# Patient Record
Sex: Male | Born: 1965
Health system: Southern US, Community
[De-identification: ages and names within clinical notes are randomized; demographics above are authoritative.]

## PROBLEM LIST (undated history)

## (undated) DIAGNOSIS — I639 Cerebral infarction, unspecified: Secondary | ICD-10-CM

## (undated) DIAGNOSIS — E119 Type 2 diabetes mellitus without complications: Secondary | ICD-10-CM

## (undated) DIAGNOSIS — N529 Male erectile dysfunction, unspecified: Secondary | ICD-10-CM

## (undated) DIAGNOSIS — B019 Varicella without complication: Secondary | ICD-10-CM

## (undated) DIAGNOSIS — K921 Melena: Secondary | ICD-10-CM

## (undated) DIAGNOSIS — M109 Gout, unspecified: Secondary | ICD-10-CM

## (undated) DIAGNOSIS — I10 Essential (primary) hypertension: Secondary | ICD-10-CM

## (undated) HISTORY — DX: Type 2 diabetes mellitus without complications: E11.9

## (undated) HISTORY — DX: Melena: K92.1

## (undated) HISTORY — DX: Varicella without complication: B01.9

## (undated) HISTORY — DX: Gout, unspecified: M10.9

## (undated) HISTORY — DX: Male erectile dysfunction, unspecified: N52.9

## (undated) HISTORY — DX: Cerebral infarction, unspecified: I63.9

## (undated) HISTORY — PX: COLONOSCOPY: SHX174

---

## 1998-05-25 ENCOUNTER — Emergency Department (HOSPITAL_COMMUNITY): Admission: EM | Admit: 1998-05-25 | Discharge: 1998-05-25 | Payer: Self-pay | Admitting: Emergency Medicine

## 1998-05-25 ENCOUNTER — Encounter: Payer: Self-pay | Admitting: Emergency Medicine

## 2002-06-24 HISTORY — PX: APPENDECTOMY: SHX54

## 2003-01-21 ENCOUNTER — Encounter: Payer: Self-pay | Admitting: *Deleted

## 2003-01-21 ENCOUNTER — Inpatient Hospital Stay (HOSPITAL_COMMUNITY): Admission: EM | Admit: 2003-01-21 | Discharge: 2003-01-22 | Payer: Self-pay | Admitting: *Deleted

## 2003-01-21 ENCOUNTER — Encounter (INDEPENDENT_AMBULATORY_CARE_PROVIDER_SITE_OTHER): Payer: Self-pay | Admitting: Specialist

## 2004-02-08 ENCOUNTER — Ambulatory Visit (HOSPITAL_COMMUNITY): Admission: RE | Admit: 2004-02-08 | Discharge: 2004-02-08 | Payer: Self-pay | Admitting: Family Medicine

## 2004-02-08 ENCOUNTER — Encounter: Payer: Self-pay | Admitting: Cardiology

## 2007-12-16 ENCOUNTER — Encounter: Admission: RE | Admit: 2007-12-16 | Discharge: 2007-12-16 | Payer: Self-pay | Admitting: Family Medicine

## 2008-01-31 ENCOUNTER — Emergency Department (HOSPITAL_COMMUNITY): Admission: EM | Admit: 2008-01-31 | Discharge: 2008-01-31 | Payer: Self-pay | Admitting: Emergency Medicine

## 2010-11-09 NOTE — Op Note (Signed)
[name], [name] NO.:  [id number]   MEDICAL RECORD NO.:  [id number]                   PATIENT TYPE:  INP   LOCATION:  0103                                 FACILITY:  [facility]   PHYSICIAN:  Anselm Pancoast. Zachery Dakins, M.D.          DATE OF BIRTH:  October 07, 1965   DATE OF PROCEDURE:  01/21/2003  DATE OF DISCHARGE:                                 OPERATIVE REPORT   PREOPERATIVE DIAGNOSIS:  Retrocecal appendicitis.   POSTOPERATIVE DIAGNOSIS:  Retrocecal appendicitis.   OPERATION:  Laparoscopic appendectomy.   ANESTHESIA:  General.   SURGEON:  Anselm Pancoast. Zachery Dakins, M.D.   HISTORY:  Keith Leblanc is a 45 year old male, about 266 pounds, and quite  tall, who presented to the emergency room with right-sided abdominal pain of  approximately 24 hours duration.  He was seen by Sheppard Penton. Stacie Acres, M.D.  Originally an ultrasound of the gallbladder was obtained since the pain was  a little higher than right lower quadrant.  There were no stones seen in the  gallbladder.  His white count was about 12,000, and then a CT was obtained.  This showed an appendix that was inflamed that was called retrocecal, but it  was really laying over in the gutter, kind of behind the cecum, not truly a  retrocecal, and he was definitely tender where the appendix was located, and  I was in agreement with the diagnosis.  I recommended that we attempt to do  a laparoscopic appendectomy because of his size.  He has quite a prominent  weakness at the umbilicus but no really that he is aware of as a symptomatic  umbilical hernia.   DESCRIPTION OF PROCEDURE:  The patient was taken to the operative suite and  given 3 g of Unasyn and positioned on the OR table and induction of general  anesthesia.  His EKG showed changes consistent with intermittent possible  hypertension and when he was first seen, he was hypertensive and then after  pain medication, he was normotensive.  A small incision  was made just above  the umbilicus where the little fascial defect was the most prominent and  just basically was opened through the skin, and we were actually in a little  weakness, kind of a bone-shaped hernia sac.  Pursestring suture of 0 Vicryl  was placed, and then the carbon dioxide was started after the Hasson cannula  placed.  We could not see the exudate in the right lower quadrant, but I  could see kind of a mobile cecum, and the upper 5 mm trocar was placed in  his right subcostal area, and then the 10-11 trocar in the left lower  quadrant.  With this and the camera basically in the lower port site, I  could kind of manipulate the cecum and found the base of the cecum, could  identify the appendix, and the appendix was laying right at the lateral  peritoneal reflection  but not truly retrocecal.  I took the harmonic scalpel  and kind of divided the peritoneum of this so we could rotate it into the  field.  Then, the harmonic scalpel was used to kind of go across the  appendiceal mesentery in numerous little bites and then after the base was  freed, I transected the base of the appendix where it joined with the cecum  with the GIA disposable linear stapler.  The remaining little portion of the  mesentery was then divided and sealed with the harmonic scalpel.  The  __________ of appendix was placed in an EndoCatch bag and brought out  through the fascial defect.  We had irrigated, and I reinspected, and no  evidence of any bleeding.  The lower port was first removed under direct  vision.  The irrigating fluid was removed, and then the remaining 5 mm port  was withdrawn.  With this, I then freed up the hernia sac circumferentially  so we were back to a little bit of much better fascia and then closed the  fascia transversely with about 4-5 sutures of 0 Prolene.  The subcutaneous  tissue was then closed with 4-0 Vicryl and then Benzoin and Steri-Strips on  the skin.  I closed the  subcutaneous tissue with a 4-0 Vicryl in the lower  quadrant in the 5 mm trocar site.  The patient tolerated the procedure  nicely, was extubated, and taken to the recovery room in a stable postop  condition.  We will advise him to see a medical doctor if he does not have  one since I expect that he is an undiagnosed mild hypertensive patient with  his EKG changes kind of intermittent elevated blood pressures noted.                                               Anselm Pancoast. Zachery Dakins, M.D.    WJW/MEDQ  D:  01/21/2003  T:  01/21/2003  Job:  [id number]

## 2011-03-22 LAB — URIC ACID: Uric Acid, Serum: 7.6

## 2012-08-23 ENCOUNTER — Ambulatory Visit: Payer: 59

## 2013-02-04 ENCOUNTER — Emergency Department (HOSPITAL_COMMUNITY)
Admission: EM | Admit: 2013-02-04 | Discharge: 2013-02-04 | Disposition: A | Discharge status: Home / Self Care | Payer: 59 | Attending: Emergency Medicine | Admitting: Emergency Medicine

## 2013-02-04 ENCOUNTER — Emergency Department (HOSPITAL_COMMUNITY): Payer: 59

## 2013-02-04 ENCOUNTER — Encounter (HOSPITAL_COMMUNITY): Payer: Self-pay | Admitting: Family Medicine

## 2013-02-04 DIAGNOSIS — I10 Essential (primary) hypertension: Secondary | ICD-10-CM | POA: Insufficient documentation

## 2013-02-04 DIAGNOSIS — M25562 Pain in left knee: Secondary | ICD-10-CM

## 2013-02-04 DIAGNOSIS — Z79899 Other long term (current) drug therapy: Secondary | ICD-10-CM | POA: Insufficient documentation

## 2013-02-04 DIAGNOSIS — M25569 Pain in unspecified knee: Secondary | ICD-10-CM | POA: Insufficient documentation

## 2013-02-04 DIAGNOSIS — IMO0002 Reserved for concepts with insufficient information to code with codable children: Secondary | ICD-10-CM | POA: Insufficient documentation

## 2013-02-04 HISTORY — DX: Essential (primary) hypertension: I10

## 2013-02-04 MED ORDER — HYDROCODONE-ACETAMINOPHEN 5-325 MG PO TABS
1.0000 | ORAL_TABLET | Freq: Once | ORAL | Status: AC
Start: 2013-02-04 — End: 2013-02-04
  Administered 2013-02-04: 1 via ORAL
  Filled 2013-02-04: qty 1

## 2013-02-04 MED ORDER — PREDNISONE 20 MG PO TABS
60.0000 mg | ORAL_TABLET | Freq: Once | ORAL | Status: AC
  Administered 2013-02-04: 60 mg via ORAL
  Filled 2013-02-04: qty 3

## 2013-02-04 MED ORDER — PREDNISONE 50 MG PO TABS: 50.0000 mg | ORAL_TABLET | Freq: Every day | ORAL | Status: DC

## 2013-02-04 MED ORDER — HYDROCODONE-ACETAMINOPHEN 5-325 MG PO TABS: 1.0000 | ORAL_TABLET | Freq: Four times a day (QID) | ORAL | Status: DC | PRN

## 2013-02-04 NOTE — ED Notes (Signed)
Patient states he has had left knee pain since Thursday. Pain worse at night and is swollen in the mornings. Patient has taken Ibuprofen and BC Powder without relief of symptoms. Edema noted to left knee. Patient using crutches to assist with ambulation. Patient states pain with weight bearing and when straightening leg it locks.

## 2013-02-04 NOTE — ED Notes (Signed)
Lawyer, PA at bedside.  

## 2013-02-04 NOTE — ED Notes (Signed)
Patient transported to X-ray 

## 2013-02-04 NOTE — ED Provider Notes (Signed)
CSN: [id number]     Arrival date & time 02/04/13  4098 History     First MD Initiated Contact with Patient 02/04/13 0606     Chief Complaint  Patient presents with  . Knee Pain   (Consider location/radiation/quality/duration/timing/severity/associated sxs/prior Treatment) HPI Patient present to the emergency department with left knee pain that began 1 week ago.  Patient, states, that his previous episodes of pain with his knee.  Patient, states, that he's also had similar pain with other joints such as his elbow feet and fingers.  Patient, states, that he has not taken any medications prior to arrival for his symptoms.  Patient denies chest pain, shortness of breath, back pain, fever, weakness, dizziness, or numbness. Past Medical History  Diagnosis Date  . Hypertension    Past Surgical History  Procedure Laterality Date  . Appendectomy     No family history on file. History  Substance Use Topics  . Smoking status: Never Smoker   . Smokeless tobacco: Not on file  . Alcohol Use: Yes     Comment: 3-4    Review of Systems All other systems negative except as documented in the HPI. All pertinent positives and negatives as reviewed in the HPI. Allergies  Review of patient's allergies indicates no known allergies.  Home Medications   Current Outpatient Rx  Name  Route  Sig  Dispense  Refill  . amLODipine-valsartan (EXFORGE) 10-160 MG per tablet   Oral   Take 1 tablet by mouth daily.         . nebivolol (BYSTOLIC) 5 MG tablet   Oral   Take 5 mg by mouth daily.         Marland Kitchen HYDROcodone-acetaminophen (NORCO/VICODIN) 5-325 MG per tablet   Oral   Take 1 tablet by mouth every 6 (six) hours as needed for pain.   15 tablet   0   . predniSONE (DELTASONE) 50 MG tablet   Oral   Take 1 tablet (50 mg total) by mouth daily.   6 tablet   0    BP 160/100  Pulse 72  Temp(Src) 98.9 F (37.2 C) (Oral)  Resp 18  Ht 6\' 5"  (1.956 m)  Wt 265 lb (120.203 kg)  BMI 31.42 kg/m2   SpO2 100% Physical Exam  Nursing note and vitals reviewed. Constitutional: He is oriented to person, place, and time. He appears well-developed and well-nourished. No distress.  HENT:  Head: Normocephalic and atraumatic.  Cardiovascular: Normal rate, regular rhythm and normal heart sounds.   Pulmonary/Chest: Effort normal and breath sounds normal.  Musculoskeletal:       Left knee: He exhibits swelling and effusion. He exhibits normal range of motion, no ecchymosis, no deformity and normal alignment. Tenderness found. Medial joint line tenderness noted.  Neurological: He is alert and oriented to person, place, and time.  Skin: Skin is warm and dry. No rash noted. No erythema.    ED Course   Procedures (including critical care time)  Labs Reviewed - No data to display Dg Knee Complete 4 Views Left  02/04/2013   *RADIOLOGY REPORT*  Clinical Data: Knee pain.  LEFT KNEE - COMPLETE 4+ VIEW  Comparison: No priors.  Findings: A large suprapatellar effusion.  No acute displaced fracture, subluxation or dislocation.  IMPRESSION: Large suprapatellar effusion.   Original Report Authenticated By: Trudie Reed, M.D.   1. Knee pain, left    patient referred to orthopedics for further evaluation and care.  This could worsen and gout flare  since the patient has had similar episodes in other joints.  Patient is given the immobilizer for comfort.  Patient is advised ice and elevate his knee.  The patient does not appear to have any signs of septic joint based on his exam.  MDM    Carlyle Dolly, PA-C 02/05/13 [phone]

## 2013-02-05 NOTE — ED Provider Notes (Signed)
Medical screening examination/treatment/procedure(s) were performed by non-physician practitioner and as supervising physician I was immediately available for consultation/collaboration.  Raeford Razor, MD 02/05/13 [phone]

## 2015-04-28 ENCOUNTER — Ambulatory Visit (INDEPENDENT_AMBULATORY_CARE_PROVIDER_SITE_OTHER): Payer: 59 | Admitting: Adult Health

## 2015-04-28 ENCOUNTER — Encounter: Payer: Self-pay | Admitting: Adult Health

## 2015-04-28 VITALS — BP 140/80 | Temp 98.6°F | Ht 77.0 in | Wt 265.6 lb

## 2015-04-28 DIAGNOSIS — Z7689 Persons encountering health services in other specified circumstances: Secondary | ICD-10-CM

## 2015-04-28 DIAGNOSIS — I10 Essential (primary) hypertension: Secondary | ICD-10-CM | POA: Insufficient documentation

## 2015-04-28 DIAGNOSIS — N529 Male erectile dysfunction, unspecified: Secondary | ICD-10-CM | POA: Diagnosis not present

## 2015-04-28 DIAGNOSIS — Z7189 Other specified counseling: Secondary | ICD-10-CM

## 2015-04-28 DIAGNOSIS — M109 Gout, unspecified: Secondary | ICD-10-CM

## 2015-04-28 MED ORDER — DOXAZOSIN MESYLATE 4 MG PO TABS: ORAL_TABLET | ORAL | Status: DC

## 2015-04-28 MED ORDER — SILDENAFIL CITRATE 20 MG PO TABS: ORAL_TABLET | ORAL | Status: DC

## 2015-04-28 MED ORDER — INDOMETHACIN 50 MG PO CAPS: 50.0000 mg | ORAL_CAPSULE | Freq: Three times a day (TID) | ORAL | Status: DC | PRN

## 2015-04-28 NOTE — Progress Notes (Signed)
HPI:  Keith Leblanc is here to establish care. He is a healthy AA male who  has a past medical history of Hypertension; Chicken pox; Blood in stool; Erectile dysfunction; and Gout.  Last PCP and physical: Over a year- Dr. Criss Rosales ( will get records)  Has the following chronic problems that require follow up and concerns today:  HTN - He is currently taking exforge, Cardura and Bystolic.Has not had Cardura for over two weeks due to not having a PCP. He feels as though his BP is well controlled on these medications  Gout - He has gout flares every 3 months. Has been using Colchicine which he endorses working well.   ROS negative for unless reported above: fevers, chills,feeling poorly, unintentional weight loss, hearing or vision loss, chest pain, palpitations, leg claudication, struggling to breath,Not feeling congested in the chest, no orthopenia, no cough,no wheezing, normal appetite, no soft tissue swelling, no hemoptysis, melena, hematochezia, hematuria, falls, loc, si, or thoughts of self harm.   Immunizations: Does not want flu  Diet:He is working on eating healthy Exercise: Does not exercise  Colonoscopy:2006 - for blood in stool.    Past Medical History  Diagnosis Date  . Hypertension   . Chicken pox   . Blood in stool     Past Surgical History  Procedure Laterality Date  . Appendectomy  2004    Family History  Problem Relation Age of Onset  . Alcohol abuse Father   . Breast cancer Mother   . Hypertension Mother   . Hypertension Father   . Heart disease Father     Social History   Social History  . Marital Status: Single    Spouse Name: N/A  . Number of Children: N/A  . Years of Education: N/A   Social History Main Topics  . Smoking status: Never Smoker   . Smokeless tobacco: None  . Alcohol Use: 0.0 oz/week    0 Standard drinks or equivalent per week     Comment: 3-4  . Drug Use: Yes    Special: Marijuana  . Sexual Activity: Yes   Other Topics  Concern  . None   Social History Narrative     Current outpatient prescriptions:  .  amLODipine-valsartan (EXFORGE) 10-320 MG tablet, Take 1 tablet by mouth daily. , Disp: , Rfl:  .  Cod Liver Oil CAPS, Take 1 capsule by mouth daily., Disp: , Rfl:  .  colchicine 0.6 MG tablet, Take 0.6 mg by mouth 2 (two) times daily., Disp: , Rfl: 0 .  doxazosin (CARDURA) 4 MG tablet, TAKE 1/2 TABLET BY MOUTH AT BEDTIME X 4 NIGHTS THEN START 1 TABLET AT BEDTIME, Disp: , Rfl: 4 .  nebivolol (BYSTOLIC) 5 MG tablet, Take 5 mg by mouth daily., Disp: , Rfl:  .  VIAGRA 100 MG tablet, ONE TABLET BY MOUTH AS DIRECTED DAILY AS NEEDED, Disp: , Rfl: 3  EXAM:  Filed Vitals:   04/28/15 1027  BP: 140/80  Temp: 98.6 F (37 C)    Body mass index is 31.49 kg/(m^2).  GENERAL: vitals reviewed and listed above, alert, oriented, appears well hydrated and in no acute distress  HEENT: atraumatic, conjunttiva clear, no obvious abnormalities on inspection of external nose and ears  NECK: Neck is soft and supple without masses, no adenopathy or thyromegaly, trachea midline, no JVD. Normal range of motion.   LUNGS: clear to auscultation bilaterally, no wheezes, rales or rhonchi, good air movement  CV: Regular rate and rhythm,  normal S1/S2, no audible murmurs, gallops, or rubs. No carotid bruit and no peripheral edema.   MS: moves all extremities without noticeable abnormality. No edema noted  Abd: soft/nontender/nondistended/normal bowel sounds   Skin: warm and dry, no rash   Extremities: No clubbing, cyanosis, or edema. Capillary refill is WNL. Pulses intact bilaterally in upper and lower extremities.   Neuro: CN II-XII intact, sensation and reflexes normal throughout, 5/5 muscle strength in bilateral upper and lower extremities. Normal finger to nose. Normal rapid alternating movements.  PSYCH: pleasant and cooperative, no obvious depression or anxiety  ASSESSMENT AND PLAN:  1. Encounter to establish  care - Follow up in December for CPE - Follow up sooner if needed  2. Essential hypertension - doxazosin (CARDURA) 4 MG tablet; TAKE 1/2 TABLET BY MOUTH AT BEDTIME X 4 NIGHTS THEN START 1 TABLET AT BEDTIME  Dispense: 30 tablet; Refill: 4  3. Erectile dysfunction, unspecified erectile dysfunction type  - sildenafil (REVATIO) 20 MG tablet; Take 2-5 tablets as needed for sexual activity  Dispense: 50 tablet; Refill: 3  4. Gout of multiple sites, unspecified cause, unspecified chronicity  - indomethacin (INDOCIN) 50 MG capsule; Take 1 capsule (50 mg total) by mouth 3 (three) times daily as needed.  Dispense: 30 capsule; Refill: 0   -We reviewed the PMH, PSH, FH, SH, Meds and Allergies. -We provided refills for any medications we will prescribe as needed. -We addressed current concerns per orders and patient instructions. -We have asked for records for pertinent exams, studies, vaccines and notes from previous providers. -We have advised patient to follow up per instructions below.   -Patient advised to return or notify a provider immediately if symptoms worsen or persist or new concerns arise.    [employer]

## 2015-04-28 NOTE — Patient Instructions (Signed)
It was great meeting you today!  Please follow up in December for a physical   I have sent in prescriptions for Indomethacin ( for gout) and Cardura.   Start working out and eating healthy.   Look into going to [employer]   If you need anything before your physical please let me know.

## 2015-05-01 ENCOUNTER — Other Ambulatory Visit: Payer: Self-pay | Admitting: Adult Health

## 2015-05-01 DIAGNOSIS — I10 Essential (primary) hypertension: Secondary | ICD-10-CM

## 2015-05-01 MED ORDER — DOXAZOSIN MESYLATE 4 MG PO TABS: ORAL_TABLET | ORAL | Status: DC

## 2015-05-26 ENCOUNTER — Ambulatory Visit: Payer: 59 | Admitting: Adult Health

## 2015-05-29 ENCOUNTER — Ambulatory Visit: Payer: 59 | Admitting: Adult Health

## 2015-06-09 ENCOUNTER — Ambulatory Visit (INDEPENDENT_AMBULATORY_CARE_PROVIDER_SITE_OTHER): Payer: 59 | Admitting: Adult Health

## 2015-06-09 ENCOUNTER — Encounter: Payer: Self-pay | Admitting: Adult Health

## 2015-06-09 ENCOUNTER — Other Ambulatory Visit: Payer: Self-pay | Admitting: Adult Health

## 2015-06-09 ENCOUNTER — Telehealth: Payer: Self-pay | Admitting: Adult Health

## 2015-06-09 VITALS — BP 140/100 | Temp 99.2°F | Ht 77.0 in | Wt 267.0 lb

## 2015-06-09 DIAGNOSIS — I10 Essential (primary) hypertension: Secondary | ICD-10-CM | POA: Diagnosis not present

## 2015-06-09 LAB — CBC WITH DIFFERENTIAL/PLATELET
BASOS ABS: 0 10*3/uL (ref 0.0–0.1)
BASOS PCT: 0.7 % (ref 0.0–3.0)
EOS ABS: 0.2 10*3/uL (ref 0.0–0.7)
Eosinophils Relative: 3 % (ref 0.0–5.0)
HEMATOCRIT: 41.6 % (ref 39.0–52.0)
Hemoglobin: 14.1 g/dL (ref 13.0–17.0)
LYMPHS ABS: 2.6 10*3/uL (ref 0.7–4.0)
LYMPHS PCT: 48.5 % — AB (ref 12.0–46.0)
MCHC: 33.8 g/dL (ref 30.0–36.0)
MCV: 93 fl (ref 78.0–100.0)
MONO ABS: 0.3 10*3/uL (ref 0.1–1.0)
Monocytes Relative: 5.8 % (ref 3.0–12.0)
NEUTROS ABS: 2.3 10*3/uL (ref 1.4–7.7)
NEUTROS PCT: 42 % — AB (ref 43.0–77.0)
PLATELETS: 302 10*3/uL (ref 150.0–400.0)
RBC: 4.47 Mil/uL (ref 4.22–5.81)
RDW: 14 % (ref 11.5–15.5)
WBC: 5.4 10*3/uL (ref 4.0–10.5)

## 2015-06-09 LAB — BASIC METABOLIC PANEL
BUN: 13 mg/dL (ref 6–23)
CALCIUM: 9.4 mg/dL (ref 8.4–10.5)
CHLORIDE: 102 meq/L (ref 96–112)
CO2: 31 meq/L (ref 19–32)
CREATININE: 1.15 mg/dL (ref 0.40–1.50)
GFR: 87.53 mL/min (ref >60.00)
Glucose, Bld: 126 mg/dL — ABNORMAL HIGH (ref 70–99)
Potassium: 3.9 mEq/L (ref 3.5–5.1)
Sodium: 140 mEq/L (ref 135–145)

## 2015-06-09 MED ORDER — NEBIVOLOL HCL 10 MG PO TABS: 10.0000 mg | ORAL_TABLET | Freq: Every day | ORAL | Status: DC

## 2015-06-09 NOTE — Telephone Encounter (Signed)
Left VM, labs are fine. Will go up on Bystolic from 5 mg to 10 mg.

## 2015-06-09 NOTE — Progress Notes (Signed)
Subjective:    Patient ID: Keith Leblanc, male    DOB: 1965/09/06, 49 y.o.   MRN: [id number]  HPI  49 year old male who presents to the office today for follow up regarding hypertension. He endorses that he is exercising more but not enough. His diet has improved. Denies any blurred vision or headaches. Is compliant with his medications.   Review of Systems  Constitutional: Negative.   Respiratory: Negative.   Cardiovascular: Negative.   Musculoskeletal: Negative.   Skin: Negative.   Neurological: Negative.   All other systems reviewed and are negative.  Past Medical History  Diagnosis Date  . Hypertension   . Chicken pox   . Blood in stool     bright red blood   . Erectile dysfunction   . Gout     Social History   Social History  . Marital Status: Single    Spouse Name: N/A  . Number of Children: N/A  . Years of Education: N/A   Occupational History  . Not on file.   Social History Main Topics  . Smoking status: Never Smoker   . Smokeless tobacco: Not on file  . Alcohol Use: 0.0 oz/week    0 Standard drinks or equivalent per week     Comment: 3-4  . Drug Use: Yes    Special: Marijuana  . Sexual Activity: Yes   Other Topics Concern  . Not on file   Social History Narrative   Works for [employer]    Not married    One daughter who does not live with him    Likes to gamble, walk in the parks, travel.     Past Surgical History  Procedure Laterality Date  . Appendectomy  2004    Family History  Problem Relation Age of Onset  . Alcohol abuse Father   . Breast cancer Mother   . Hypertension Mother   . Hypertension Father   . Heart disease Father     No Known Allergies  Current Outpatient Prescriptions on File Prior to Visit  Medication Sig Dispense Refill  . amLODipine-valsartan (EXFORGE) 10-320 MG tablet Take 1 tablet by mouth daily.     Marland Kitchen Cod Liver Oil CAPS Take 1 capsule by mouth daily.    Marland Kitchen doxazosin (CARDURA) 4 MG tablet TAKE 1/2 TABLET BY  MOUTH AT BEDTIME X 4 NIGHTS THEN START 1 TABLET AT BEDTIME 90 tablet 4  . nebivolol (BYSTOLIC) 5 MG tablet Take 5 mg by mouth daily.    . sildenafil (REVATIO) 20 MG tablet Take 2-5 tablets as needed for sexual activity 50 tablet 3  . VIAGRA 100 MG tablet ONE TABLET BY MOUTH AS DIRECTED DAILY AS NEEDED  3  . colchicine 0.6 MG tablet Take 0.6 mg by mouth 2 (two) times daily. Reported on 06/09/2015  0  . indomethacin (INDOCIN) 50 MG capsule Take 1 capsule (50 mg total) by mouth 3 (three) times daily as needed. (Patient not taking: Reported on 06/09/2015) 30 capsule 0   No current facility-administered medications on file prior to visit.    BP 140/100 mmHg  Temp(Src) 99.2 F (37.3 C) (Oral)  Ht 6\' 5"  (1.956 m)  Wt 267 lb (121.11 kg)  BMI 31.65 kg/m2       Objective:   Physical Exam  Constitutional: He is oriented to person, place, and time. He appears well-developed and well-nourished. No distress.  Eyes: Conjunctivae are normal. Pupils are equal, round, and reactive to light. Right eye exhibits  no discharge. Left eye exhibits no discharge.  Cardiovascular: Normal rate, regular rhythm, normal heart sounds and intact distal pulses.  Exam reveals no gallop and no friction rub.   No murmur heard. Pulmonary/Chest: Effort normal and breath sounds normal. No respiratory distress. He has no wheezes. He has no rales. He exhibits no tenderness.  Neurological: He is alert and oriented to person, place, and time.  Skin: Skin is warm and dry. No rash noted. He is not diaphoretic. No erythema. No pallor.  Psychiatric: He has a normal mood and affect. His behavior is normal. Judgment and thought content normal.  Nursing note and vitals reviewed.      Assessment & Plan:  1. Essential hypertension - Blood pressure continues to be high despite triple therapy. Will get blood work and then decide on increase in medications.  - He is ok with this plan.  - Basic metabolic panel - CBC with  Differential/Platelet

## 2015-06-09 NOTE — Patient Instructions (Signed)
It was great seeing you again!  I will follow up with you regarding your blood work and then we can talk about medications.   Keep working on diet and exercise. I would love to take you off some of these medications in the future.   Enjoy your holiday and the time with your daughter!  If you need anything, please let me know.

## 2015-07-02 ENCOUNTER — Other Ambulatory Visit: Payer: Self-pay | Admitting: Adult Health

## 2015-07-24 ENCOUNTER — Telehealth: Payer: Self-pay | Admitting: Adult Health

## 2015-07-24 MED ORDER — AMLODIPINE BESYLATE-VALSARTAN 10-320 MG PO TABS: 1.0000 | ORAL_TABLET | Freq: Every day | ORAL | Status: DC

## 2015-07-24 NOTE — Telephone Encounter (Signed)
Rx called and spoke with pt; pt request a 30 day supply of medication.  Rx was sent to pharmacy for #30x 5 rf.

## 2015-07-24 NOTE — Telephone Encounter (Signed)
Pt request refill  amLODipine-valsartan (EXFORGE) 10-320 MG tablet  [facility] [address]/ [address]

## 2015-08-30 ENCOUNTER — Other Ambulatory Visit: Payer: Self-pay | Admitting: Adult Health

## 2015-08-30 DIAGNOSIS — I10 Essential (primary) hypertension: Secondary | ICD-10-CM

## 2015-08-31 MED ORDER — DOXAZOSIN MESYLATE 4 MG PO TABS: ORAL_TABLET | ORAL | Status: DC

## 2015-08-31 NOTE — Addendum Note (Signed)
Addended by: Colleen Can on: 08/31/2015 09:36 AM   Modules accepted: Orders

## 2015-08-31 NOTE — Telephone Encounter (Signed)
Rx sent to pharmacy; pt now takes 1 tablet by mouth daily.  Also advised pt that he needs to schedule a physical exam soon.  Pt verbalized understanding.

## 2015-10-02 ENCOUNTER — Other Ambulatory Visit: Payer: Self-pay | Admitting: Adult Health

## 2015-10-02 MED ORDER — AMLODIPINE BESYLATE-VALSARTAN 10-320 MG PO TABS: 1.0000 | ORAL_TABLET | Freq: Every day | ORAL | Status: DC

## 2015-10-24 ENCOUNTER — Other Ambulatory Visit: Payer: Self-pay | Admitting: Adult Health

## 2015-12-26 ENCOUNTER — Other Ambulatory Visit: Payer: Self-pay | Admitting: Adult Health

## 2015-12-27 NOTE — Telephone Encounter (Signed)
Ok to refill for 90 days  

## 2015-12-27 NOTE — Telephone Encounter (Signed)
Ok to refill 

## 2016-02-03 ENCOUNTER — Other Ambulatory Visit: Payer: Self-pay | Admitting: Adult Health

## 2016-02-03 DIAGNOSIS — I10 Essential (primary) hypertension: Secondary | ICD-10-CM

## 2016-03-05 ENCOUNTER — Other Ambulatory Visit: Payer: Self-pay | Admitting: Adult Health

## 2016-03-05 DIAGNOSIS — N529 Male erectile dysfunction, unspecified: Secondary | ICD-10-CM

## 2016-03-05 NOTE — Telephone Encounter (Signed)
Ok to refill 

## 2016-03-06 NOTE — Telephone Encounter (Signed)
Rx refill sent to pharmacy. 

## 2016-05-15 ENCOUNTER — Telehealth: Payer: Self-pay | Admitting: Adult Health

## 2016-05-15 ENCOUNTER — Other Ambulatory Visit: Payer: Self-pay

## 2016-05-15 MED ORDER — AMLODIPINE BESYLATE-VALSARTAN 10-320 MG PO TABS: 1.0000 | ORAL_TABLET | Freq: Every day | ORAL | 0 refills | Status: DC

## 2016-05-15 NOTE — Telephone Encounter (Signed)
Please advise on refill.

## 2016-05-15 NOTE — Telephone Encounter (Signed)
Rx has been sent in. 

## 2016-05-15 NOTE — Telephone Encounter (Signed)
He has made an appointment. Ok to send in 90 days

## 2016-05-15 NOTE — Telephone Encounter (Signed)
Pharmacy called for pt to request a 90 day refill of  amLODipine-valsartan (EXFORGE) [id number] MG tablet BYSTOLIC 5 MG tablet  Pt not seen since 12/16.  Advised phrmacy pt may not get the 90 day rx until they make an appointment. Pharmacy states they will relay they message, but still requesting a 90 day due to insurance requirements.  [facility]

## 2016-05-21 ENCOUNTER — Ambulatory Visit (INDEPENDENT_AMBULATORY_CARE_PROVIDER_SITE_OTHER): Payer: 59 | Admitting: Adult Health

## 2016-05-21 ENCOUNTER — Encounter: Payer: Self-pay | Admitting: Adult Health

## 2016-05-21 VITALS — BP 146/72 | Temp 98.1°F | Ht 77.0 in | Wt 276.6 lb

## 2016-05-21 DIAGNOSIS — I10 Essential (primary) hypertension: Secondary | ICD-10-CM

## 2016-05-21 DIAGNOSIS — K921 Melena: Secondary | ICD-10-CM | POA: Diagnosis not present

## 2016-05-21 LAB — BASIC METABOLIC PANEL
BUN: 13 mg/dL (ref 6–23)
CHLORIDE: 99 meq/L (ref 96–112)
CO2: 30 meq/L (ref 19–32)
Calcium: 9.8 mg/dL (ref 8.4–10.5)
Creatinine, Ser: 1.18 mg/dL (ref 0.40–1.50)
GFR: 84.63 mL/min (ref >60.00)
Glucose, Bld: 169 mg/dL — ABNORMAL HIGH (ref 70–99)
POTASSIUM: 3.8 meq/L (ref 3.5–5.1)
Sodium: 138 mEq/L (ref 135–145)

## 2016-05-21 NOTE — Patient Instructions (Signed)
It was great seeing you today!  1. Someone from GI will call you to schedule your appointment with them.   2. Please schedule a physical with me.   3. Start eating healthy and exercising.

## 2016-05-21 NOTE — Addendum Note (Signed)
Addended by: Denna Haggard K on: 05/21/2016 11:11 AM   Modules accepted: Orders

## 2016-05-21 NOTE — Progress Notes (Signed)
Subjective:    Patient ID: Keith Leblanc, male    DOB: 1966/04/28, 50 y.o.   MRN: [id number]  HPI  50 year old male who presents to the office today for follow up regarding hypertension. I last saw him about 11 months ago. He is currently taking Exforge [id number], and Bystolic ( he is unsure of what dose he is taking)  He is not exercising and is not eating well.   He has a history of blood in stool. He reports in the office today that over the last year he has bright red blood in his stool, often times " a lot of it." This has been going on intermittently over the last year. This happens maybe every three months and lasts for a few days. He does have episodes of abdominal cramping  He reports possible clots?  He had a colonoscopy maybe 10 years ago.   He denies any sour taste in his mouth or chest discomfort.   Review of Systems  Constitutional: Negative.   HENT: Negative.   Respiratory: Negative.   Cardiovascular: Negative.   Gastrointestinal: Positive for abdominal pain and blood in stool. Negative for constipation, diarrhea, nausea, rectal pain and vomiting.  Genitourinary: Negative.   Musculoskeletal: Negative.   All other systems reviewed and are negative.  Past Medical History:  Diagnosis Date  . Blood in stool    bright red blood   . Chicken pox   . Erectile dysfunction   . Gout   . Hypertension     Social History   Social History  . Marital status: Single    Spouse name: N/A  . Number of children: N/A  . Years of education: N/A   Occupational History  . Not on file.   Social History Main Topics  . Smoking status: Never Smoker  . Smokeless tobacco: Not on file  . Alcohol use 0.0 oz/week     Comment: 3-4  . Drug use:     Types: Marijuana  . Sexual activity: Yes   Other Topics Concern  . Not on file   Social History Narrative   Works for [employer]    Not married    One daughter who does not live with him    Likes to gamble, walk in the parks, travel.      Past Surgical History:  Procedure Laterality Date  . APPENDECTOMY  2004    Family History  Problem Relation Age of Onset  . Alcohol abuse Father   . Breast cancer Mother   . Hypertension Mother   . Hypertension Father   . Heart disease Father     No Known Allergies  Current Outpatient Prescriptions on File Prior to Visit  Medication Sig Dispense Refill  . amLODipine-valsartan (EXFORGE) 10-320 MG tablet Take 1 tablet by mouth daily. 90 tablet 0  . BYSTOLIC 5 MG tablet TAKE 1 TABLET BY MOUTH EVERY DAY 90 tablet 0  . Cod Liver Oil CAPS Take 1 capsule by mouth daily.    . colchicine 0.6 MG tablet Take 0.6 mg by mouth 2 (two) times daily. Reported on 06/09/2015  0  . doxazosin (CARDURA) 4 MG tablet TAKE 1 TABLET BY MOUTH DAILY 90 tablet 1  . indomethacin (INDOCIN) 50 MG capsule TAKE ONE CAPSULE BY MOUTH 3 TIMES A DAY 30 capsule 5  . nebivolol (BYSTOLIC) 10 MG tablet Take 1 tablet (10 mg total) by mouth daily. 30 tablet 6  . VIAGRA 100 MG tablet ONE TABLET BY MOUTH  AS DIRECTED DAILY AS NEEDED  3   No current facility-administered medications on file prior to visit.     BP (!) 146/72   Temp 98.1 F (36.7 C) (Oral)   Ht 6\' 5"  (1.956 m)   Wt 276 lb 9.6 oz (125.5 kg)   BMI 32.80 kg/m       Objective:   Physical Exam  Constitutional: He is oriented to person, place, and time. He appears well-developed and well-nourished. No distress.  Cardiovascular: Normal rate, regular rhythm, normal heart sounds and intact distal pulses.  Exam reveals no gallop.   No murmur heard. Pulmonary/Chest: Effort normal and breath sounds normal. No respiratory distress. He has no wheezes. He has no rales. He exhibits no tenderness.  Abdominal: Soft. Bowel sounds are normal. He exhibits no distension and no mass. There is no tenderness. There is no rebound and no guarding.  Genitourinary: Rectum normal and prostate normal. Rectal exam shows guaiac negative stool.  Neurological: He is alert and  oriented to person, place, and time. He has normal reflexes.  Skin: Skin is warm and dry. No rash noted. He is not diaphoretic. No erythema. No pallor.  Psychiatric: He has a normal mood and affect. His behavior is normal. Judgment and thought content normal.  Vitals reviewed.      Assessment & Plan:  1. Essential hypertension - Educated on the importance of diet and exercise - Follow up for CPE  - He is going to call back with the dose of Bystolic he is taking and I will increase that - Basic metabolic panel; Future - CBC with Differential/Platelet  2. Blood in stool - Ambulatory referral to Gastroenterology  Dorothyann Peng, NP

## 2016-05-22 ENCOUNTER — Encounter: Payer: Self-pay | Admitting: Physician Assistant

## 2016-05-22 LAB — CBC WITH DIFFERENTIAL/PLATELET
BASOS ABS: 0 10*3/uL (ref 0.0–0.1)
Basophils Relative: 0.9 % (ref 0.0–3.0)
EOS ABS: 0.1 10*3/uL (ref 0.0–0.7)
Eosinophils Relative: 1.5 % (ref 0.0–5.0)
HEMATOCRIT: 40.4 % (ref 39.0–52.0)
Hemoglobin: 13.9 g/dL (ref 13.0–17.0)
LYMPHS PCT: 45.5 % (ref 12.0–46.0)
Lymphs Abs: 2.5 10*3/uL (ref 0.7–4.0)
MCHC: 34.3 g/dL (ref 30.0–36.0)
MCV: 93.8 fl (ref 78.0–100.0)
Monocytes Absolute: 0.3 10*3/uL (ref 0.1–1.0)
Monocytes Relative: 5.2 % (ref 3.0–12.0)
NEUTROS ABS: 2.6 10*3/uL (ref 1.4–7.7)
NEUTROS PCT: 46.9 % (ref 43.0–77.0)
PLATELETS: 314 10*3/uL (ref 150.0–400.0)
RBC: 4.31 Mil/uL (ref 4.22–5.81)
RDW: 13.8 % (ref 11.5–15.5)
WBC: 5.4 10*3/uL (ref 4.0–10.5)

## 2016-05-22 NOTE — Addendum Note (Signed)
Addended by: Sandria Bales B on: 05/22/2016 10:25 AM   Modules accepted: Orders

## 2016-05-30 ENCOUNTER — Ambulatory Visit: Payer: 59 | Admitting: Physician Assistant

## 2016-06-13 ENCOUNTER — Ambulatory Visit: Payer: 59 | Admitting: Physician Assistant

## 2016-07-07 ENCOUNTER — Emergency Department (HOSPITAL_COMMUNITY): Payer: BLUE CROSS/BLUE SHIELD

## 2016-07-07 ENCOUNTER — Encounter (HOSPITAL_COMMUNITY): Payer: Self-pay | Admitting: Emergency Medicine

## 2016-07-07 ENCOUNTER — Inpatient Hospital Stay (HOSPITAL_COMMUNITY)
Admission: EM | Admit: 2016-07-07 | Discharge: 2016-07-09 | DRG: 637 | Disposition: A | Discharge status: Home / Self Care | Payer: BLUE CROSS/BLUE SHIELD | Attending: Internal Medicine | Admitting: Internal Medicine

## 2016-07-07 DIAGNOSIS — E43 Unspecified severe protein-calorie malnutrition: Secondary | ICD-10-CM | POA: Diagnosis present

## 2016-07-07 DIAGNOSIS — Z8249 Family history of ischemic heart disease and other diseases of the circulatory system: Secondary | ICD-10-CM | POA: Diagnosis not present

## 2016-07-07 DIAGNOSIS — E871 Hypo-osmolality and hyponatremia: Secondary | ICD-10-CM | POA: Diagnosis present

## 2016-07-07 DIAGNOSIS — N179 Acute kidney failure, unspecified: Secondary | ICD-10-CM | POA: Diagnosis present

## 2016-07-07 DIAGNOSIS — Z6827 Body mass index (BMI) 27.0-27.9, adult: Secondary | ICD-10-CM | POA: Diagnosis not present

## 2016-07-07 DIAGNOSIS — E663 Overweight: Secondary | ICD-10-CM | POA: Diagnosis present

## 2016-07-07 DIAGNOSIS — Z79899 Other long term (current) drug therapy: Secondary | ICD-10-CM

## 2016-07-07 DIAGNOSIS — E860 Dehydration: Secondary | ICD-10-CM | POA: Diagnosis present

## 2016-07-07 DIAGNOSIS — I10 Essential (primary) hypertension: Secondary | ICD-10-CM | POA: Diagnosis present

## 2016-07-07 DIAGNOSIS — M109 Gout, unspecified: Secondary | ICD-10-CM | POA: Diagnosis present

## 2016-07-07 DIAGNOSIS — N529 Male erectile dysfunction, unspecified: Secondary | ICD-10-CM | POA: Diagnosis present

## 2016-07-07 DIAGNOSIS — R066 Hiccough: Secondary | ICD-10-CM | POA: Diagnosis present

## 2016-07-07 DIAGNOSIS — E1110 Type 2 diabetes mellitus with ketoacidosis without coma: Secondary | ICD-10-CM | POA: Diagnosis present

## 2016-07-07 DIAGNOSIS — E875 Hyperkalemia: Secondary | ICD-10-CM | POA: Diagnosis present

## 2016-07-07 DIAGNOSIS — E1310 Other specified diabetes mellitus with ketoacidosis without coma: Secondary | ICD-10-CM | POA: Diagnosis not present

## 2016-07-07 DIAGNOSIS — Z833 Family history of diabetes mellitus: Secondary | ICD-10-CM

## 2016-07-07 DIAGNOSIS — R7989 Other specified abnormal findings of blood chemistry: Secondary | ICD-10-CM | POA: Diagnosis present

## 2016-07-07 DIAGNOSIS — Z720 Tobacco use: Secondary | ICD-10-CM | POA: Diagnosis present

## 2016-07-07 DIAGNOSIS — Z8619 Personal history of other infectious and parasitic diseases: Secondary | ICD-10-CM | POA: Diagnosis not present

## 2016-07-07 DIAGNOSIS — R531 Weakness: Secondary | ICD-10-CM | POA: Diagnosis not present

## 2016-07-07 DIAGNOSIS — M1A00X0 Idiopathic chronic gout, unspecified site, without tophus (tophi): Secondary | ICD-10-CM

## 2016-07-07 DIAGNOSIS — M1A9XX0 Chronic gout, unspecified, without tophus (tophi): Secondary | ICD-10-CM | POA: Diagnosis not present

## 2016-07-07 DIAGNOSIS — K219 Gastro-esophageal reflux disease without esophagitis: Secondary | ICD-10-CM | POA: Diagnosis present

## 2016-07-07 DIAGNOSIS — E669 Obesity, unspecified: Secondary | ICD-10-CM | POA: Diagnosis present

## 2016-07-07 DIAGNOSIS — F17200 Nicotine dependence, unspecified, uncomplicated: Secondary | ICD-10-CM

## 2016-07-07 LAB — BASIC METABOLIC PANEL
ANION GAP: 29 — AB (ref 5–15)
Anion gap: 17 — ABNORMAL HIGH (ref 5–15)
Anion gap: 13 (ref 5–15)
BUN: 56 mg/dL — ABNORMAL HIGH (ref 6–20)
BUN: 52 mg/dL — ABNORMAL HIGH (ref 6–20)
BUN: 50 mg/dL — AB (ref 6–20)
CHLORIDE: 103 mmol/L (ref 101–111)
CHLORIDE: 108 mmol/L (ref 101–111)
CO2: 14 mmol/L — AB (ref 22–32)
CO2: 19 mmol/L — AB (ref 22–32)
CO2: 20 mmol/L — ABNORMAL LOW (ref 22–32)
CREATININE: 2.76 mg/dL — AB (ref 0.61–1.24)
CREATININE: 2.31 mg/dL — AB (ref 0.61–1.24)
CREATININE: 1.85 mg/dL — AB (ref 0.61–1.24)
Calcium: 9.1 mg/dL (ref 8.9–10.3)
Calcium: 8.9 mg/dL (ref 8.9–10.3)
Calcium: 8.8 mg/dL — ABNORMAL LOW (ref 8.9–10.3)
Chloride: 84 mmol/L — ABNORMAL LOW (ref 101–111)
GFR calc Af Amer: 37 mL/min — ABNORMAL LOW (ref >60)
GFR calc Af Amer: 48 mL/min — ABNORMAL LOW (ref >60)
GFR calc non Af Amer: 32 mL/min — ABNORMAL LOW (ref >60)
GFR calc non Af Amer: 41 mL/min — ABNORMAL LOW (ref >60)
GFR, EST AFRICAN AMERICAN: 30 mL/min — AB (ref >60)
GFR, EST NON AFRICAN AMERICAN: 26 mL/min — AB (ref >60)
Glucose, Bld: 1081 mg/dL (ref 65–99)
Glucose, Bld: 405 mg/dL — ABNORMAL HIGH (ref 65–99)
Glucose, Bld: 193 mg/dL — ABNORMAL HIGH (ref 65–99)
Potassium: 5.3 mmol/L — ABNORMAL HIGH (ref 3.5–5.1)
Potassium: 4 mmol/L (ref 3.5–5.1)
Potassium: 5.5 mmol/L — ABNORMAL HIGH (ref 3.5–5.1)
Sodium: 127 mmol/L — ABNORMAL LOW (ref 135–145)
Sodium: 139 mmol/L (ref 135–145)
Sodium: 141 mmol/L (ref 135–145)

## 2016-07-07 LAB — CBC
HEMATOCRIT: 42.9 % (ref 39.0–52.0)
HEMATOCRIT: 41 % (ref 39.0–52.0)
HEMOGLOBIN: 14.3 g/dL (ref 13.0–17.0)
HEMOGLOBIN: 14 g/dL (ref 13.0–17.0)
MCH: 32.1 pg (ref 26.0–34.0)
MCH: 31.7 pg (ref 26.0–34.0)
MCHC: 33.3 g/dL (ref 30.0–36.0)
MCHC: 34.1 g/dL (ref 30.0–36.0)
MCV: 96.4 fL (ref 78.0–100.0)
MCV: 93 fL (ref 78.0–100.0)
Platelets: 354 10*3/uL (ref 150–400)
Platelets: 309 10*3/uL (ref 150–400)
RBC: 4.45 MIL/uL (ref 4.22–5.81)
RBC: 4.41 MIL/uL (ref 4.22–5.81)
RDW: 12.6 % (ref 11.5–15.5)
RDW: 12.3 % (ref 11.5–15.5)
WBC: 7.7 10*3/uL (ref 4.0–10.5)
WBC: 9.6 10*3/uL (ref 4.0–10.5)

## 2016-07-07 LAB — URINALYSIS, ROUTINE W REFLEX MICROSCOPIC
BILIRUBIN URINE: NEGATIVE
Glucose, UA: >=500 mg/dL — AB
KETONES UR: 20 mg/dL — AB
LEUKOCYTES UA: NEGATIVE
Nitrite: NEGATIVE
PROTEIN: NEGATIVE mg/dL
SPECIFIC GRAVITY, URINE: 1.025 (ref 1.005–1.030)
SQUAMOUS EPITHELIAL / LPF: NONE SEEN
pH: 5 (ref 5.0–8.0)

## 2016-07-07 LAB — I-STAT TROPONIN, ED: TROPONIN I, POC: 0.02 ng/mL (ref 0.00–0.08)

## 2016-07-07 LAB — BLOOD GAS, ARTERIAL
ACID-BASE DEFICIT: 14.1 mmol/L — AB (ref 0.0–2.0)
Bicarbonate: 11.5 mmol/L — ABNORMAL LOW (ref 20.0–28.0)
DRAWN BY: 295031
FIO2: 21
O2 Saturation: 94.7 %
PCO2 ART: 26.9 mmHg — AB (ref 32.0–48.0)
PH ART: 7.255 — AB (ref 7.350–7.450)
Patient temperature: 98.6
pO2, Arterial: 89.4 mmHg (ref 83.0–108.0)

## 2016-07-07 LAB — GLUCOSE, CAPILLARY
GLUCOSE-CAPILLARY: 397 mg/dL — AB (ref 65–99)
GLUCOSE-CAPILLARY: 159 mg/dL — AB (ref 65–99)
Glucose-Capillary: 124 mg/dL — ABNORMAL HIGH (ref 65–99)
Glucose-Capillary: 186 mg/dL — ABNORMAL HIGH (ref 65–99)
Glucose-Capillary: 229 mg/dL — ABNORMAL HIGH (ref 65–99)
Glucose-Capillary: 322 mg/dL — ABNORMAL HIGH (ref 65–99)
Glucose-Capillary: 413 mg/dL — ABNORMAL HIGH (ref 65–99)

## 2016-07-07 LAB — CBG MONITORING, ED: Glucose-Capillary: >600 mg/dL (ref 65–99)

## 2016-07-07 LAB — MRSA PCR SCREENING: MRSA BY PCR: NEGATIVE

## 2016-07-07 LAB — MAGNESIUM: Magnesium: 3.3 mg/dL — ABNORMAL HIGH (ref 1.7–2.4)

## 2016-07-07 LAB — PHOSPHORUS: PHOSPHORUS: 2.9 mg/dL (ref 2.5–4.6)

## 2016-07-07 MED ORDER — SODIUM CHLORIDE 0.9 % IV SOLN: INTRAVENOUS | Status: DC

## 2016-07-07 MED ORDER — HEPARIN SODIUM (PORCINE) 5000 UNIT/ML IJ SOLN
5000.0000 [IU] | Freq: Three times a day (TID) | INTRAMUSCULAR | Status: DC
  Administered 2016-07-07 – 2016-07-08 (×2): 5000 [IU] via SUBCUTANEOUS
  Filled 2016-07-07 (×4): qty 1

## 2016-07-07 MED ORDER — DEXTROSE-NACL 5-0.45 % IV SOLN
INTRAVENOUS | Status: DC
  Administered 2016-07-07: 21:00:00 via INTRAVENOUS

## 2016-07-07 MED ORDER — SODIUM CHLORIDE 0.9 % IV BOLUS (SEPSIS)
1000.0000 mL | Freq: Once | INTRAVENOUS | Status: AC
  Administered 2016-07-07: 1000 mL via INTRAVENOUS

## 2016-07-07 MED ORDER — DEXTROSE-NACL 5-0.45 % IV SOLN: INTRAVENOUS | Status: DC

## 2016-07-07 MED ORDER — FAMOTIDINE 20 MG PO TABS
20.0000 mg | ORAL_TABLET | Freq: Every day | ORAL | Status: DC
  Administered 2016-07-07: 20 mg via ORAL
  Filled 2016-07-07 (×2): qty 1

## 2016-07-07 MED ORDER — SODIUM CHLORIDE 0.9 % IV SOLN
INTRAVENOUS | Status: DC
  Administered 2016-07-07: 13:00:00 via INTRAVENOUS

## 2016-07-07 MED ORDER — ONDANSETRON HCL 4 MG/2ML IJ SOLN
4.0000 mg | Freq: Once | INTRAMUSCULAR | Status: AC
  Administered 2016-07-07: 4 mg via INTRAVENOUS
  Filled 2016-07-07: qty 2

## 2016-07-07 MED ORDER — ONDANSETRON HCL 4 MG/2ML IJ SOLN: 4.0000 mg | Freq: Three times a day (TID) | INTRAMUSCULAR | Status: DC | PRN

## 2016-07-07 MED ORDER — SODIUM CHLORIDE 0.9 % IV SOLN
INTRAVENOUS | Status: DC
  Administered 2016-07-07 (×2): via INTRAVENOUS

## 2016-07-07 MED ORDER — NEBIVOLOL HCL 5 MG PO TABS
5.0000 mg | ORAL_TABLET | Freq: Every day | ORAL | Status: DC
  Administered 2016-07-07 – 2016-07-09 (×3): 5 mg via ORAL
  Filled 2016-07-07 (×3): qty 1

## 2016-07-07 MED ORDER — SODIUM CHLORIDE 0.9 % IV SOLN
INTRAVENOUS | Status: DC
  Administered 2016-07-07: 5.4 [IU]/h via INTRAVENOUS
  Filled 2016-07-07: qty 2.5

## 2016-07-07 MED ORDER — INSULIN ASPART 100 UNIT/ML ~~LOC~~ SOLN
10.0000 [IU] | Freq: Once | SUBCUTANEOUS | Status: DC
  Filled 2016-07-07: qty 1

## 2016-07-07 MED ORDER — SODIUM CHLORIDE 0.9 % IV SOLN
12.5000 mg | Freq: Once | INTRAVENOUS | Status: AC
  Administered 2016-07-07: 12.5 mg via INTRAVENOUS
  Filled 2016-07-07: qty 0.5

## 2016-07-07 MED ORDER — SODIUM CHLORIDE 0.9 % IV SOLN: INTRAVENOUS | Status: AC

## 2016-07-07 NOTE — ED Notes (Signed)
Pt is here due to feeling progressively worse over the past week.  Pt has been having unquenchable thirst and frequent urination.  Pt has been developing increasing generalized weakness and body aches.  Pt appears dehydrated and has a very dry mouth.  Pt found to be hyperglycemic in triage with no previously known history of diabetes.  IV started and IV fluids infusing at this time

## 2016-07-07 NOTE — H&P (Signed)
History and Physical    Keith Leblanc [id number] DOB: 07-11-1965 DOA: 07/07/2016  Referring Provider: Dr. Roderic Ovens PCP: Dorothyann Peng, NP   Patient coming from: home  Chief Complaint: polyuria, polydipsia, generalized weakness and extreme fatigue.   HPI: Keith Leblanc is a 51 y.o. male with PMH significant for for HTN, overweight, gout, tobacco abuse and marijuana use; presented to ED with polyuria, polydipsia, generalized weakness and extreme fatigue. Patient denies CP, SOB, sick contacts, fever, chills, dysuria, hematuria, melena, hematemesis or any other complaints.  18f note; symptoms has been present for over 1-2 weeks and gradually worsneing.   ED Course: found to be in DKA, 2L IVF's given and glucostabilizer initiated. [facility] called to admit patient for further evaluation and treatment.   Review of Systems:  All other systems reviewed and apart from HPI, are negative.  Past Medical History:  Diagnosis Date  . Blood in stool    bright red blood   . Chicken pox   . Erectile dysfunction   . Gout   . Hypertension     Past Surgical History:  Procedure Laterality Date  . APPENDECTOMY  2004     reports that he has never smoked. He has never used smokeless tobacco. He reports that he drinks alcohol. He reports that he uses drugs, including Marijuana.  No Known Allergies  Family History  Problem Relation Age of Onset  . Alcohol abuse Father   . Hypertension Father   . Heart disease Father   . Breast cancer Mother   . Hypertension Mother      Prior to Admission medications   Medication Sig Start Date End Date Taking? Authorizing Provider  amLODipine-valsartan (EXFORGE) 10-320 MG tablet Take 1 tablet by mouth daily. 05/15/16  Yes Dorothyann Peng, NP  BYSTOLIC 5 MG tablet TAKE 1 TABLET BY MOUTH EVERY DAY 12/27/15  Yes Dorothyann Peng, NP  doxazosin (CARDURA) 4 MG tablet TAKE 1 TABLET BY MOUTH DAILY 02/05/16  Yes Dorothyann Peng, NP  Cod Liver Oil CAPS Take 1 capsule by mouth  daily.    Historical Provider, MD  colchicine 0.6 MG tablet Take 0.6 mg by mouth 2 (two) times daily. Reported on 06/09/2015 01/25/15   Historical Provider, MD  indomethacin (INDOCIN) 50 MG capsule TAKE ONE CAPSULE BY MOUTH 3 TIMES A DAY Patient not taking: Reported on 07/07/2016 07/03/15   Dorothyann Peng, NP  nebivolol (BYSTOLIC) 10 MG tablet Take 1 tablet (10 mg total) by mouth daily. Patient not taking: Reported on 07/07/2016 06/09/15   Dorothyann Peng, NP  VIAGRA 100 MG tablet ONE TABLET BY MOUTH AS DIRECTED DAILY AS NEEDED 03/15/15   Historical Provider, MD    Physical Exam: Vitals:   07/07/16 1542 07/07/16 1605 07/07/16 1700 07/07/16 1705  BP: 149/76 140/69  (!) 111/52  Pulse: 81 83 74 77  Resp: 17 19 17 17   Temp:  98.1 F (36.7 C)    TempSrc:  Oral    SpO2: 99% 98% 97% 98%  Weight:  109.5 kg (241 lb 6.5 oz)    Height:  6\' 6"  (1.981 m)      Constitutional: NAD, afebrile and reporting just generalized weakness and extreme fatigue. No CP, no SOB. Eyes: PERTLA, lids and conjunctivae normal, no icterus, no nystagmus  ENMT: Mucous membranes were dry on exam. Posterior pharynx clear of any exudate or lesions. Normal dentition.  Neck: normal, supple, no masses, no thyromegaly, no JVD Respiratory: clear to auscultation bilaterally, no wheezing, no crackles. Normal respiratory effort. No  accessory muscle use.  Cardiovascular: S1 & S2 heard, regular rate and rhythm, positive SEM; no rubs or allops. No extremity edema. 2+ pedal pulses. No carotid bruits.  Abdomen: No distension, no tenderness, no masses palpated. No hepatosplenomegaly. Bowel sounds normal.  Musculoskeletal: no clubbing / cyanosis. No joint deformity upper and lower extremities. Good ROM, no contractures. Normal muscle tone.  Skin: no rashes, lesions, ulcers. No induration Neurologic: CN 2-12 grossly intact. Sensation intact, DTR normal. Strength 5/5 in all 4 limbs.  Psychiatric: Normal judgment and insight. Alert and oriented x 3.  Normal mood.    Labs on Admission: I have personally reviewed following labs and imaging studies  CBC:  Recent Labs Lab 07/07/16 1059  WBC 7.7  HGB 14.3  HCT 42.9  MCV 96.4  PLT [id number]   Basic Metabolic Panel:  Recent Labs Lab 07/07/16 1059  NA 127*  K 5.3*  CL 84*  CO2 14*  GLUCOSE 1,081*  BUN 56*  CREATININE 2.76*  CALCIUM 9.1   GFR: Estimated Creatinine Clearance: 42.3 mL/min (by C-G formula based on SCr of 2.76 mg/dL (H)).  CBG:  Recent Labs Lab 07/07/16 1037 07/07/16 1358 07/07/16 1505 07/07/16 1659  GLUCAP >600* >600* >600* 413*   Urine analysis:    Component Value Date/Time   COLORURINE STRAW (A) 07/07/2016 1039   APPEARANCEUR CLEAR 07/07/2016 1039   LABSPEC 1.025 07/07/2016 1039   PHURINE 5.0 07/07/2016 1039   GLUCOSEU >=500 (A) 07/07/2016 1039   HGBUR SMALL (A) 07/07/2016 1039   BILIRUBINUR NEGATIVE 07/07/2016 1039   KETONESUR 20 (A) 07/07/2016 1039   PROTEINUR NEGATIVE 07/07/2016 1039   NITRITE NEGATIVE 07/07/2016 1039   LEUKOCYTESUR NEGATIVE 07/07/2016 1039   Sepsis Labs: @LABRCNTIP (procalcitonin:4,lacticidven:4) ) Recent Results (from the past 240 hour(s))  Blood culture (routine x 2)     Status: None (Preliminary result)   Collection Time: 07/07/16 12:25 PM  Result Value Ref Range Status   Specimen Description   Final    BLOOD LEFT ANTECUBITAL Performed at [place] [facility]  Final   Culture PENDING  Incomplete   Report Status PENDING  Incomplete     Radiological Exams on Admission: Dg Chest 2 View  Result Date: 07/07/2016 CLINICAL DATA:  SOB and weakness today; Pt is here due to feeling progressively worse over the past week. Pt has been having unquenchable thirst and frequent urination. Pt has been developing increasing generalized weakness and body aches. Pt appears dehydrated and has a very dry mouth. Pt found to be hyperglycemic in triage with no previously known  history of diabetes; HTN; non smoker EXAM: CHEST  2 VIEW COMPARISON:  None. FINDINGS: The heart size and mediastinal contours are within normal limits. Both lungs are clear. No pleural effusion or pneumothorax. The visualized skeletal structures are unremarkable. IMPRESSION: No active cardiopulmonary disease. Electronically Signed   By: Lajean Manes M.D.   On: 07/07/2016 13:19    EKG:  No acute ischemic changes, normal rate and sinus rhythm   Assessment/Plan 1-Diabetic ketoacidosis without coma associated with type 2 diabetes mellitus ([place]): newly diagnosed. -patient debuted on DKA -no signs of infection , EKG and troponin suggesting no acute ischemia (patient also w/o CP) -will check A1C -patient started on Insulin drip -will follow CBG and BMET's closely -transition to long acting and SSI once gap is close, CBG < 200 in 4 occasions and CO2 > 19 -on admission gap 29, CBG, over 1000 and  CO2 14  2-acute renal failure -normal renal function in Nov 2017 -most likely associated with pre-renal status and dehydration from DKA -no signs of infection in UA -will provide aggressive IVF's and hold nephrotoxic agents -will follow renal function trend   3-Essential hypertension: -will hold ARB due to acute renal failure -will also hold amlodipine and cardura given soft BP -continue bystolic   4-Gout -without acute flare -will hold colchicine and indomethacin  5-Hyponatremia -due to dehydration and hyperglycemia -will provide IVF's and correct CBG's -will follow electrolytes trend   6-Hyperkalemia -no acute changes on EKG -will follow on telemetry for now -expecting resolution with insulin therapy -holding ARB   7-Overweight  -Body mass index is 27.9 kg/m. -low calorie diet and exercise discussed with patient   8-Tobacco abuse and marijuana   -cessation counseling provided -patient declined nicotine patch -will look into quitting recreational drugs use -will check  UDS  9-GERD and hiccups -will start patient on famotidine daily and will give one dose of thorazine  Time: 65 minutes   DVT prophylaxis:  Heparin  Code Status:  Full Family Communication: wife at bedside  Disposition Plan: home when medically stable (needs resolution of DKA and ARF, also initiation of maintenance treatment and adjust on dosage) Consults called: diabetes coordinator   Admission status: inpatient, stepdown, LOS > 2 midnights     Barton Dubois MD [facility] Pager [phone]  If 7PM-7AM, please contact night-coverage [email or web address] Password [facility]  07/07/2016, 5:29 PM

## 2016-07-07 NOTE — ED Triage Notes (Addendum)
Pt c/o weakness, polydipsia, polyuria, urinating about every hour, nausea, diffuse bilateral blurred vision x 1 week. Has increased PO water and Gatorade intake. Blood glucose > 600. No history of diabetes.

## 2016-07-07 NOTE — ED Provider Notes (Signed)
[facility] DEPT Provider Note   CSN: [id number] Arrival date & time: 07/07/16  1011     History   Chief Complaint Chief Complaint  Patient presents with  . Hyperglycemia    HPI Keith Leblanc is a 51 y.o. male with pertinent past medical history of hypertension, obesity, gout and erectile dysfunction presents to the emergency department with dry mouth, frequent urination, increased thirst, dizziness, weakness, sweats and blurred vision in both eyes that started 1 week ago. Patient initially thought it was a viral infection however this morning he woke up and felt very weak. Patient states he has been drinking a lot of Gatorade, soda and other sugary drinks recently to tire try to hydrate himself. Pt also reporting upper/epigastric abdominal pain with swallowing that radiates to his central chest.  Pt denies shortness of breath, nausea or diaphoresis with this pain.  Patient denies upper respiratory symptoms including nasal congestion, cough, sneezing. Patient denies shortness of breath, nausea, vomiting, diarrhea, constipation. Patient denies fevers. Patient denies dysuria.   Patient states his mother and his father both have type 2 diabetes. Patient has never been diagnosed with diabetes in the past.  Patient states he does not exercise and doesn't follow a healthy diet. Per chart review patient saw his primary care physician for a physical exam on November 2017. At that time PCP ordered A1c which was never completed.  Patient's last 2 BASIC metabolic panels show hyperglycemia with glucose at 126 and 169. No record of A1c found on chart.    HPI  Past Medical History:  Diagnosis Date  . Blood in stool    bright red blood   . Chicken pox   . Erectile dysfunction   . Gout   . Hypertension     Patient Active Problem List   Diagnosis Date Noted  . Blood in stool 05/21/2016  . Essential hypertension 04/28/2015  . Erectile dysfunction 04/28/2015  . Gout 04/28/2015    Past  Surgical History:  Procedure Laterality Date  . APPENDECTOMY  2004       Home Medications    Prior to Admission medications   Medication Sig Start Date End Date Taking? Authorizing Provider  amLODipine-valsartan (EXFORGE) 10-320 MG tablet Take 1 tablet by mouth daily. 05/15/16  Yes Dorothyann Peng, NP  BYSTOLIC 5 MG tablet TAKE 1 TABLET BY MOUTH EVERY DAY 12/27/15  Yes Dorothyann Peng, NP  doxazosin (CARDURA) 4 MG tablet TAKE 1 TABLET BY MOUTH DAILY 02/05/16  Yes Dorothyann Peng, NP  Cod Liver Oil CAPS Take 1 capsule by mouth daily.    Historical Provider, MD  colchicine 0.6 MG tablet Take 0.6 mg by mouth 2 (two) times daily. Reported on 06/09/2015 01/25/15   Historical Provider, MD  indomethacin (INDOCIN) 50 MG capsule TAKE ONE CAPSULE BY MOUTH 3 TIMES A DAY Patient not taking: Reported on 07/07/2016 07/03/15   Dorothyann Peng, NP  nebivolol (BYSTOLIC) 10 MG tablet Take 1 tablet (10 mg total) by mouth daily. Patient not taking: Reported on 07/07/2016 06/09/15   Dorothyann Peng, NP  VIAGRA 100 MG tablet ONE TABLET BY MOUTH AS DIRECTED DAILY AS NEEDED 03/15/15   Historical Provider, MD    Family History Family History  Problem Relation Age of Onset  . Alcohol abuse Father   . Breast cancer Mother   . Hypertension Mother   . Hypertension Father   . Heart disease Father     Social History Social History  Substance Use Topics  . Smoking status: Never Smoker  .  Smokeless tobacco: Not on file  . Alcohol use 0.0 oz/week     Comment: 3-4     Allergies   Patient has no known allergies.   Review of Systems Review of Systems  Constitutional: Positive for chills, diaphoresis and fatigue. Negative for fever.  HENT: Negative for congestion and sore throat.   Eyes: Positive for visual disturbance.  Respiratory: Negative for cough, chest tightness and shortness of breath.   Cardiovascular: Positive for chest pain. Negative for palpitations.  Gastrointestinal: Positive for abdominal pain. Negative  for blood in stool, constipation, diarrhea, nausea and vomiting.  Endocrine: Positive for polydipsia and polyuria.  Genitourinary: Positive for frequency. Negative for difficulty urinating, discharge, flank pain, hematuria, penile pain and testicular pain.  Musculoskeletal: Negative for joint swelling and myalgias.  Skin: Negative for rash.  Neurological: Positive for dizziness and weakness. Negative for syncope, light-headedness and headaches.  Hematological: Negative.   Psychiatric/Behavioral: Negative.      Physical Exam Updated Vital Signs BP (!) 108/53   Pulse 83   Temp 98.4 F (36.9 C) (Oral)   Resp 18   Ht 6\' 6"  (1.981 m)   Wt 122 kg   SpO2 99%   BMI 31.09 kg/m   Physical Exam  Constitutional: He is oriented to person, place, and time. He appears well-developed and well-nourished. No distress.  Pt found awake in bed, alert and oriented to self and place. Good historian.  HENT:  Head: Normocephalic and atraumatic.  Nose: Nose normal.  Mouth/Throat: Oropharynx is clear and moist. No oropharyngeal exudate.  Very dry mucous membranes.  Eyes: Conjunctivae and EOM are normal. Pupils are equal, round, and reactive to light. No scleral icterus.  Neck: Normal range of motion. Neck supple. No JVD present. No tracheal deviation present.  Cardiovascular: Normal rate, regular rhythm, normal heart sounds and intact distal pulses.   No murmur heard. Pulmonary/Chest: Effort normal and breath sounds normal. No respiratory distress. He has no wheezes. He has no rales.  Abdominal: Soft. Bowel sounds are normal. He exhibits no distension. There is no tenderness.  Musculoskeletal: Normal range of motion. He exhibits no deformity.  Lymphadenopathy:    He has no cervical adenopathy.  Neurological: He is alert and oriented to person, place, and time.  Pt is alert and oriented.  Speech and phonation normal.  Thought process coherent.  Strength 5/5 in upper and lower extremities.  Sensation to  light touch intact in upper and lower extremities. Gait normal.   No leg drift.   CN I not tested CN II full visual fields  CN III, IV, VI PEERL and EOM intact CN V light touch intact in all 3 divisions of trigeminal nerve CN VII facial nerve movements intact, symmetric CN VIII hearing intact to finger rub CN IX, X no uvula deviation, symmetric soft palate rise CN XI 5/5 SCM and trapezius strength  CN XII Tongue midline with symmetric L/R movement  Skin: Skin is warm and dry. Capillary refill takes less than 2 seconds.  Psychiatric: He has a normal mood and affect. His behavior is normal. Judgment and thought content normal.  Nursing note and vitals reviewed.    ED Treatments / Results  Labs (all labs ordered are listed, but only abnormal results are displayed) Labs Reviewed  BASIC METABOLIC PANEL - Abnormal; Notable for the following:       Result Value   Sodium 127 (*)    Potassium 5.3 (*)    Chloride 84 (*)    CO2  14 (*)    Glucose, Bld 1,081 (*)    BUN 56 (*)    Creatinine, Ser 2.76 (*)    GFR calc non Af Amer 26 (*)    GFR calc Af Amer 30 (*)    Anion gap 29 (*)    All other components within normal limits  URINALYSIS, ROUTINE W REFLEX MICROSCOPIC - Abnormal; Notable for the following:    Color, Urine STRAW (*)    Glucose, UA >=500 (*)    Hgb urine dipstick SMALL (*)    Ketones, ur 20 (*)    Bacteria, UA RARE (*)    All other components within normal limits  BLOOD GAS, ARTERIAL - Abnormal; Notable for the following:    pH, Arterial 7.255 (*)    pCO2 arterial 26.9 (*)    Bicarbonate 11.5 (*)    Acid-base deficit 14.1 (*)    All other components within normal limits  CBG MONITORING, ED - Abnormal; Notable for the following:    Glucose-Capillary >600 (*)    All other components within normal limits  CBG MONITORING, ED - Abnormal; Notable for the following:    Glucose-Capillary >600 (*)    All other components within normal limits  CULTURE, BLOOD (ROUTINE X 2)    CULTURE, BLOOD (ROUTINE X 2)  CBC  HEMOGLOBIN A1C  I-STAT TROPOININ, ED    EKG  EKG Interpretation  Date/Time:  Sunday July 07 2016 11:44:49 EST Ventricular Rate:  80 PR Interval:    QRS Duration: 86 QT Interval:  391 QTC Calculation: 451 R Axis:   37 Text Interpretation:  Sinus rhythm Nonspecific T abnormalities, lateral leads Confirmed by LIU MD, Hinton Dyer [id number]) on 07/07/2016 2:34:42 PM       Radiology Dg Chest 2 View  Result Date: 07/07/2016 CLINICAL DATA:  SOB and weakness today; Pt is here due to feeling progressively worse over the past week. Pt has been having unquenchable thirst and frequent urination. Pt has been developing increasing generalized weakness and body aches. Pt appears dehydrated and has a very dry mouth. Pt found to be hyperglycemic in triage with no previously known history of diabetes; HTN; non smoker EXAM: CHEST  2 VIEW COMPARISON:  None. FINDINGS: The heart size and mediastinal contours are within normal limits. Both lungs are clear. No pleural effusion or pneumothorax. The visualized skeletal structures are unremarkable. IMPRESSION: No active cardiopulmonary disease. Electronically Signed   By: Lajean Manes M.D.   On: 07/07/2016 13:19    Procedures Procedures (including critical care time)  Medications Ordered in ED Medications  0.9 %  sodium chloride infusion ( Intravenous New Bag/Given 07/07/16 1308)  dextrose 5 %-0.45 % sodium chloride infusion ( Intravenous Not Given 07/07/16 1230)  insulin regular (NOVOLIN R,HUMULIN R) 250 Units in sodium chloride 0.9 % 250 mL (1 Units/mL) infusion (10.8 Units/hr Intravenous Rate/Dose Change 07/07/16 1401)  sodium chloride 0.9 % bolus 1,000 mL (0 mLs Intravenous Stopped 07/07/16 1233)  sodium chloride 0.9 % bolus 1,000 mL (0 mLs Intravenous Stopped 07/07/16 1401)  ondansetron (ZOFRAN) injection 4 mg (4 mg Intravenous Given 07/07/16 1309)    CRITICAL CARE Performed by: Kinnie Feil   Total critical care  time: 30 minutes  Critical care time was exclusive of separately billable procedures and treating other patients.  Critical care was necessary to treat or prevent imminent or life-threatening deterioration.  Critical care was time spent personally by me on the following activities: development of treatment plan with patient and/or surrogate as  well as nursing, discussions with consultants, evaluation of patient's response to treatment, examination of patient, obtaining history from patient or surrogate, ordering and performing treatments and interventions, ordering and review of laboratory studies, ordering and review of radiographic studies, pulse oximetry and re-evaluation of patient's condition.  Initial Impression / Assessment and Plan / ED Course  I have reviewed the triage vital signs and the nursing notes.  Pertinent labs & imaging results that were available during my care of the patient were reviewed by me and considered in my medical decision making (see chart for details).  Clinical Course as of Jul 07 1454  Sun Jul 07, 2016  1229 Hyperglycemia Glucose: (!!) 1,081 [CG]  1229 Anion gap Anion gap: (!) 29 [CG]  1229 Ketones in urine Ketones, ur: (!) 20 [CG]  1417 No cardiopulmonary disease DG Chest 2 View [CG]  1452 Normal Troponin i, poc: 0.02 [CG]  1453 No acute changes, sinus rhythm  EKG 12-Lead [CG]  1453 Spoke to Dr. Dyann Kief who will take pt to SDU  [CG]  1453 No leukocytosis WBC: 7.7 [CG]    Clinical Course User Index [CG] Kinnie Feil, PA-C   DKA with no prior pmh of diabetes. No A1C on record. No source of infection found on CXR , urinalysis. No leukocytosis. Pt given 2L IVF, started on IVF drip, insulin drip. Pt will be admitted to SDU (Dr. Petra Kuba). Pt and pt's relative aware for plan to admit, agreeable.  Final Clinical Impressions(s) / ED Diagnoses   Final diagnoses:  Diabetic ketoacidosis without coma associated with type 2 diabetes mellitus [facility])    New  Prescriptions New Prescriptions   No medications on file     Kinnie Feil, PA-C 07/07/16 [place] Liu, MD 07/07/16 1739

## 2016-07-07 NOTE — ED Notes (Signed)
Patient transported to X-ray 

## 2016-07-08 DIAGNOSIS — I10 Essential (primary) hypertension: Secondary | ICD-10-CM

## 2016-07-08 DIAGNOSIS — E871 Hypo-osmolality and hyponatremia: Secondary | ICD-10-CM

## 2016-07-08 DIAGNOSIS — N179 Acute kidney failure, unspecified: Secondary | ICD-10-CM

## 2016-07-08 DIAGNOSIS — E43 Unspecified severe protein-calorie malnutrition: Secondary | ICD-10-CM | POA: Insufficient documentation

## 2016-07-08 DIAGNOSIS — E875 Hyperkalemia: Secondary | ICD-10-CM

## 2016-07-08 DIAGNOSIS — E1310 Other specified diabetes mellitus with ketoacidosis without coma: Secondary | ICD-10-CM

## 2016-07-08 DIAGNOSIS — Z720 Tobacco use: Secondary | ICD-10-CM

## 2016-07-08 LAB — BASIC METABOLIC PANEL
ANION GAP: 9 (ref 5–15)
Anion gap: 12 (ref 5–15)
BUN: 47 mg/dL — ABNORMAL HIGH (ref 6–20)
BUN: 41 mg/dL — ABNORMAL HIGH (ref 6–20)
CALCIUM: 8.5 mg/dL — AB (ref 8.9–10.3)
CHLORIDE: 109 mmol/L (ref 101–111)
CO2: 24 mmol/L (ref 22–32)
CO2: 21 mmol/L — AB (ref 22–32)
CREATININE: 1.74 mg/dL — AB (ref 0.61–1.24)
Calcium: 8.8 mg/dL — ABNORMAL LOW (ref 8.9–10.3)
Chloride: 107 mmol/L (ref 101–111)
Creatinine, Ser: 1.78 mg/dL — ABNORMAL HIGH (ref 0.61–1.24)
GFR calc non Af Amer: 45 mL/min — ABNORMAL LOW (ref >60)
GFR, EST AFRICAN AMERICAN: 52 mL/min — AB (ref >60)
GFR, EST AFRICAN AMERICAN: 50 mL/min — AB (ref >60)
GFR, EST NON AFRICAN AMERICAN: 43 mL/min — AB (ref >60)
GLUCOSE: 240 mg/dL — AB (ref 65–99)
Glucose, Bld: 150 mg/dL — ABNORMAL HIGH (ref 65–99)
POTASSIUM: 3.5 mmol/L (ref 3.5–5.1)
Potassium: 4.2 mmol/L (ref 3.5–5.1)
SODIUM: 142 mmol/L (ref 135–145)
Sodium: 140 mmol/L (ref 135–145)

## 2016-07-08 LAB — RAPID URINE DRUG SCREEN, HOSP PERFORMED
Amphetamines: NOT DETECTED
Barbiturates: NOT DETECTED
Benzodiazepines: NOT DETECTED
Cocaine: NOT DETECTED
Opiates: NOT DETECTED
Tetrahydrocannabinol: NOT DETECTED

## 2016-07-08 LAB — GLUCOSE, CAPILLARY
GLUCOSE-CAPILLARY: 141 mg/dL — AB (ref 65–99)
GLUCOSE-CAPILLARY: 217 mg/dL — AB (ref 65–99)
GLUCOSE-CAPILLARY: 296 mg/dL — AB (ref 65–99)
GLUCOSE-CAPILLARY: 353 mg/dL — AB (ref 65–99)
GLUCOSE-CAPILLARY: 260 mg/dL — AB (ref 65–99)
Glucose-Capillary: 159 mg/dL — ABNORMAL HIGH (ref 65–99)
Glucose-Capillary: 142 mg/dL — ABNORMAL HIGH (ref 65–99)
Glucose-Capillary: 128 mg/dL — ABNORMAL HIGH (ref 65–99)
Glucose-Capillary: 158 mg/dL — ABNORMAL HIGH (ref 65–99)
Glucose-Capillary: 585 mg/dL (ref 65–99)

## 2016-07-08 MED ORDER — AMLODIPINE BESYLATE 5 MG PO TABS
5.0000 mg | ORAL_TABLET | Freq: Every day | ORAL | Status: DC
  Administered 2016-07-08: 5 mg via ORAL
  Filled 2016-07-08 (×2): qty 1

## 2016-07-08 MED ORDER — POTASSIUM CHLORIDE CRYS ER 20 MEQ PO TBCR
40.0000 meq | EXTENDED_RELEASE_TABLET | Freq: Once | ORAL | Status: AC
  Administered 2016-07-08: 40 meq via ORAL
  Filled 2016-07-08: qty 2

## 2016-07-08 MED ORDER — PANTOPRAZOLE SODIUM 40 MG PO TBEC
40.0000 mg | DELAYED_RELEASE_TABLET | Freq: Every day | ORAL | Status: DC
  Administered 2016-07-08 – 2016-07-09 (×2): 40 mg via ORAL
  Filled 2016-07-08 (×2): qty 1

## 2016-07-08 MED ORDER — INSULIN ASPART 100 UNIT/ML ~~LOC~~ SOLN
0.0000 [IU] | Freq: Three times a day (TID) | SUBCUTANEOUS | Status: DC
  Administered 2016-07-08: 20 [IU] via SUBCUTANEOUS
  Administered 2016-07-08: 11 [IU] via SUBCUTANEOUS
  Administered 2016-07-08: 7 [IU] via SUBCUTANEOUS
  Administered 2016-07-09 (×2): 11 [IU] via SUBCUTANEOUS

## 2016-07-08 MED ORDER — DOXAZOSIN MESYLATE 4 MG PO TABS
4.0000 mg | ORAL_TABLET | Freq: Every day | ORAL | Status: DC
  Administered 2016-07-08 – 2016-07-09 (×2): 4 mg via ORAL
  Filled 2016-07-08 (×2): qty 1

## 2016-07-08 MED ORDER — GI COCKTAIL ~~LOC~~
30.0000 mL | Freq: Three times a day (TID) | ORAL | Status: DC | PRN
  Administered 2016-07-08: 30 mL via ORAL
  Filled 2016-07-08: qty 30

## 2016-07-08 MED ORDER — LIVING WELL WITH DIABETES BOOK
Freq: Once | Status: AC
  Administered 2016-07-08: 15:00:00
  Filled 2016-07-08: qty 1

## 2016-07-08 MED ORDER — SODIUM CHLORIDE 0.9 % IV SOLN
INTRAVENOUS | Status: DC
  Administered 2016-07-08 – 2016-07-09 (×2): via INTRAVENOUS

## 2016-07-08 MED ORDER — INSULIN STARTER KIT- PEN NEEDLES (ENGLISH)
1.0000 | Freq: Once | Status: AC
  Administered 2016-07-08: 1
  Filled 2016-07-08: qty 1

## 2016-07-08 MED ORDER — INSULIN GLARGINE 100 UNIT/ML ~~LOC~~ SOLN
36.0000 [IU] | Freq: Every day | SUBCUTANEOUS | Status: DC
  Administered 2016-07-08: 36 [IU] via SUBCUTANEOUS
  Filled 2016-07-08: qty 0.36

## 2016-07-08 MED ORDER — INSULIN GLARGINE 100 UNIT/ML ~~LOC~~ SOLN
32.0000 [IU] | Freq: Every day | SUBCUTANEOUS | Status: DC
  Administered 2016-07-08: 32 [IU] via SUBCUTANEOUS
  Filled 2016-07-08 (×2): qty 0.32

## 2016-07-08 MED ORDER — INSULIN ASPART 100 UNIT/ML ~~LOC~~ SOLN
0.0000 [IU] | Freq: Every day | SUBCUTANEOUS | Status: DC
  Administered 2016-07-08: 3 [IU] via SUBCUTANEOUS

## 2016-07-08 NOTE — Progress Notes (Signed)
Initial Nutrition Assessment  DOCUMENTATION CODES:   Severe malnutrition in context of acute illness/injury  INTERVENTION:  - Encourage PO intakes of meals.  - RD will provide diet education 07/09/16.  NUTRITION DIAGNOSIS:   Unintentional weight loss related to acute illness, nausea, poor appetite as evidenced by per patient/family report, percent weight loss.  GOAL:   Patient will meet greater than or equal to 90% of their needs  MONITOR:   PO intake, Weight trends, Labs, I & O's  REASON FOR ASSESSMENT:   Malnutrition Screening Tool  ASSESSMENT:   51 y.o. male with PMH significant for for HTN, overweight, gout, tobacco abuse and marijuana use; presented to ED with polyuria, polydipsia, generalized weakness and extreme fatigue. Patient denies CP, SOB, sick contacts, fever, chills, dysuria, hematuria, melena, hematemesis or any other complaints.   BMI indicates overweight status. No intakes documented since admission; pt reports he ate pineapple pieces and fruit cocktail for breakfast and that he is not interested in lunch at this time. Pt reports that ~1 week PTA he began experiencing extreme thirst and was drinking excessive amounts of water, juice, soda which was unusual for him. He reports that he was also urinating every hour and began having blurred vision and some degree of confusion in the few days PTA. Pt states that during the past week he was mainly consuming fruit throughout the day and that he also tried to eat chicken and eggs but vomited after 1-2 bites. He states that nausea is slightly improved since admission with medication but he continues to feel nauseous.   He reports that his New Year's resolution was to lose 15 lbs. He states that in November he weighed 276 lbs (consistent with chart) but that he had not lost any weight prior to 06/25/16. Per chart review, CBW is -35 lbs (13% body weight) from weight in November. This is significant for time frame. Physical  assessment showed mild fat wasting to upper arm, mild muscle wasting to shoulder and lower leg, and no edema. Pt meets criteria for severe malnutrition based on weight loss and <50% needed intakes for >/= 5 days.   Informed pt that RD will provide diet education tomorrow.   Medications reviewed; PRN Maalox, sliding scale Novolog, 32 units Lantus/day, PRN IV Zofran, 40 mg oral Protonix/day, 40 mEq oral KCl/day.  Labs reviewed; CBGs: 128-217 mg/dL today, BUN: 41 mg/dL, creatinine: 1.74 mg/dL, Ca: 8.5 mg/dL, GFR: 52 mL/min.   IVF: NS @ 125 mL/hr.     Diet Order:  Diet Carb Modified Fluid consistency: Thin; Room service appropriate? Yes  Skin:  Reviewed, no issues  Last BM:  1/13 (PTA)  Height:   Ht Readings from Last 1 Encounters:  07/07/16 6\' 6"  (1.981 m)    Weight:   Wt Readings from Last 1 Encounters:  07/07/16 241 lb 6.5 oz (109.5 kg)    Ideal Body Weight:  97.27 kg  BMI:  Body mass index is 27.9 kg/m.  Estimated Nutritional Needs:   Kcal:  2300-2520 (21/23 kcal/kg)  Protein:  110-130 grams (1-1.2 grams)  Fluid:  2.3-2.5 L/day  EDUCATION NEEDS:   Education needs no appropriate at this time    Jarome Matin, MS, RD, LDN, [facility] Inpatient Clinical Dietitian Pager # [phone] After hours/weekend pager # [phone]

## 2016-07-08 NOTE — Progress Notes (Signed)
Spoke with pt about new diagnosis.  Explained what an A1C is, basic pathophysiology of DM Type 2, basic home care, basic diabetes diet nutrition principles, importance of checking CBGs and maintaining good CBG control to prevent long-term and short-term complications.  Reviewed signs and symptoms of hyperglycemia and hypoglycemia and how to treat hyperglycemia and hypoglycemia at home.  Also reviewed blood sugar goals and A1c goals for home.    RNs to provide ongoing basic DM education at bedside with this patient.  Have ordered educational booklet, insulin starter kit, and DM videos.  Have also placed RD consult for DM diet education for this patient.  Living well with DM booklet provided to patient.  Discussed booklet with pt and encouraged pt to read booklet thoroughly and ask questions as they arise.  Will place order for OP DM education to the Nutrition and DM management center.  Plan to revisit with patient tomorrow AM (01/16) to reinforce basic DM survival skills education.  RN caring for pt to allow pt to practice checking CBGs and to practice insulin administration tonight.      --Will follow patient during hospitalization--  Wyn Quaker RN, MSN, CDE Diabetes Coordinator Inpatient Glycemic Control Team Team Pager: [phone] (8a-5p)

## 2016-07-08 NOTE — Progress Notes (Signed)
Inpatient Diabetes Program Recommendations  AACE/ADA: New Consensus Statement on Inpatient Glycemic Control (2015)  Target Ranges:  Prepandial:   less than 140 mg/dL      Peak postprandial:   less than 180 mg/dL (1-2 hours)      Critically ill patients:  140 - 180 mg/dL   Results for Keith Leblanc, Keith Leblanc (MRN [id number]) as of 07/08/2016 10:59  Ref. Range 07/07/2016 10:59  Sodium Latest Ref Range: 135 - 145 mmol/L 127 (L)  Potassium Latest Ref Range: 3.5 - 5.1 mmol/L 5.3 (H)  Chloride Latest Ref Range: 101 - 111 mmol/L 84 (L)  CO2 Latest Ref Range: 22 - 32 mmol/L 14 (L)  Glucose Latest Ref Range: 65 - 99 mg/dL 1,081 (HH)  BUN Latest Ref Range: 6 - 20 mg/dL 56 (H)  Creatinine Latest Ref Range: 0.61 - 1.24 mg/dL 2.76 (H)  Calcium Latest Ref Range: 8.9 - 10.3 mg/dL 9.1  Anion gap Latest Ref Range: 5 - 15  29 (H)    Admit with: DKA/ New Diagnosis of DM  Current Insulin Orders: Lantus 32 units QHS      Novolog Resistant Correction Scale/ SSI (0-20 units) TID AC + HS       -Note patient given 32 units Lantus at 3am.  IV Insulin drip stopped at 5:30am.  -Current Hemoglobin A1c level pending.  -Will order educational materials for this patient and have nursing staff begin basic DM education.  -DM Coordinator will attempt to visit with patient today as well.     --Will follow patient during hospitalization--  Wyn Quaker RN, MSN, CDE Diabetes Coordinator Inpatient Glycemic Control Team Team Pager: [phone] (8a-5p)

## 2016-07-08 NOTE — Progress Notes (Signed)
PROGRESS NOTE    Keith Leblanc  [id number] DOB: 01/11/1966 DOA: 07/07/2016 PCP: Dorothyann Peng, NP    Brief Narrative:  Patient is a pleasant 51 year old gentleman history of hypertension, gout presented to the ED with polyuria, polydipsia, generalized symptoms of fatigue and weakness. Patient noted be in diabetic ketoacidosis with new onset diabetes mellitus. Patient places step unit and placed on the insulin glucometer. CBGs improved and patient has been transitioned to subcutaneous insulin.    Assessment & Plan:   Principal Problem:   Diabetic ketoacidosis without coma associated with type 2 diabetes mellitus ([place]) Active Problems:   Essential hypertension   Gout   Acute renal failure (ARF) (HCC)   Hyponatremia   Hyperkalemia   Obesity (BMI 35.0-39.9 without comorbidity)   Tobacco abuse   Overweight (BMI 25.0-29.9)  #1 diabetic ketoacidosis without, associated with new onset type 2 diabetes mellitus Patient had presented with diabetic ketoacidosis with her blood sugar of 1081. Patient also presented with polyuria, polydipsia, weakness and generalized fatigue. Patient noted to have an anion gap of 29 with a bicarbonate of 14. Patient with no signs or symptoms of infection. EKG and troponin negative for any acute ischemia. Patient was placed on the insulin drip blood sugars have improved and patient has been transitioned to subcutaneous insulin. Hemoglobin A1c pending. Continue sliding scale insulin. Diabetic coordinator consulted for diabetes education. Patient will need close outpatient follow-up.  #2 acute renal failure Likely secondary to a prerenal azotemia in the setting of dehydration from DKA and ARB. ARB and NSAIDs have been discontinued. Renal function slowly trending down. Urine output of 1.8 L over the past 24 hours. Increase IV fluids to normal saline and 1 25 mL per hour. Follow.  #3 hyponatremia Improved with hydration.  #4 hypertension Blood pressure was  borderline on admission and as such some of patient's antihypertensive medications were held. Continue by [employer] for now, IV fluids.  #5 tobacco abuse Tobacco cessation.  #6 hyperkalemia Resolved.   DVT prophylaxis: Heparin Code Status: Full Family Communication: Updated patient and family at bedside. Disposition Plan: Transfer to [facility] floor. Home when medically stable, blood sugars have been controlled, patient has been seen and assessed by the diabetic coordinator, and renal function back to baseline.   Consultants:   None  Procedures:   Chest x-ray 07/07/2016    Antimicrobials:   None   Subjective: Patient states feeling better. Patient denies any shortness of breath. Patient complaining of his chest soreness when he eats however states food does not get stuck.  Objective: Vitals:   07/08/16 0000 07/08/16 0400 07/08/16 0737 07/08/16 0800  BP: 125/64 (!) 132/59 124/73 114/67  Pulse: 61 61  (!) 56  Resp: 16 17  13   Temp: 98.5 F (36.9 C) 98.4 F (36.9 C) 98.1 F (36.7 C)   TempSrc: Oral Oral    SpO2: 96% 96%  96%  Weight:      Height:        Intake/Output Summary (Last 24 hours) at 07/08/16 1031 Last data filed at 07/08/16 0900  Gross per 24 hour  Intake          5552.21 ml  Output             1800 ml  Net          3752.21 ml   Filed Weights   07/07/16 1028 07/07/16 1605  Weight: 122 kg (269 lb) 109.5 kg (241 lb 6.5 oz)    Examination:  General exam: Appears  calm and comfortable  Respiratory system: Clear to auscultation. Respiratory effort normal. Cardiovascular system: S1 & S2 heard, RRR. No JVD, murmurs, rubs, gallops or clicks. No pedal edema. Gastrointestinal system: Abdomen is nondistended, soft and nontender. No organomegaly or masses felt. Normal bowel sounds heard. Central nervous system: Alert and oriented. No focal neurological deficits. Extremities: Symmetric 5 x 5 power. Skin: No rashes, lesions or ulcers Psychiatry: Judgement  and insight appear normal. Mood & affect appropriate.     Data Reviewed: I have personally reviewed following labs and imaging studies  CBC:  Recent Labs Lab 07/07/16 1059 07/07/16 1805  WBC 7.7 9.6  HGB 14.3 14.0  HCT 42.9 41.0  MCV 96.4 93.0  PLT 354 [id number]   Basic Metabolic Panel:  Recent Labs Lab 07/07/16 1059 07/07/16 1805 07/07/16 2119 07/08/16 0054 07/08/16 0813  NA 127* 139 141 142 140  K 5.3* 4.0 5.5* 3.5 4.2  CL 84* 103 108 109 107  CO2 14* 19* 20* 24 21*  GLUCOSE 1,081* 405* 193* 150* 240*  BUN 56* 52* 50* 47* 41*  CREATININE 2.76* 2.31* 1.85* 1.78* 1.74*  CALCIUM 9.1 8.9 8.8* 8.8* 8.5*  MG  --  3.3*  --   --   --   PHOS  --  2.9  --   --   --    GFR: Estimated Creatinine Clearance: 67.1 mL/min (by C-G formula based on SCr of 1.74 mg/dL (H)). Liver Function Tests: No results for input(s): AST, ALT, ALKPHOS, BILITOT, PROT, ALBUMIN in the last 168 hours. No results for input(s): LIPASE, AMYLASE in the last 168 hours. No results for input(s): AMMONIA in the last 168 hours. Coagulation Profile: No results for input(s): INR, PROTIME in the last 168 hours. Cardiac Enzymes: No results for input(s): CKTOTAL, CKMB, CKMBINDEX, TROPONINI in the last 168 hours. BNP (last 3 results) No results for input(s): PROBNP in the last 8760 hours. HbA1C: No results for input(s): HGBA1C in the last 72 hours. CBG:  Recent Labs Lab 07/08/16 0138 07/08/16 0241 07/08/16 0344 07/08/16 0450 07/08/16 0744  GLUCAP 141* 159* 142* 128* 217*   Lipid Profile: No results for input(s): CHOL, HDL, LDLCALC, TRIG, CHOLHDL, LDLDIRECT in the last 72 hours. Thyroid Function Tests: No results for input(s): TSH, T4TOTAL, FREET4, T3FREE, THYROIDAB in the last 72 hours. Anemia Panel: No results for input(s): VITAMINB12, FOLATE, FERRITIN, TIBC, IRON, RETICCTPCT in the last 72 hours. Sepsis Labs: No results for input(s): PROCALCITON, LATICACIDVEN in the last 168 hours.  Recent Results  (from the past 240 hour(s))  Blood culture (routine x 2)     Status: None (Preliminary result)   Collection Time: 07/07/16 12:25 PM  Result Value Ref Range Status   Specimen Description   Final    BLOOD LEFT ANTECUBITAL Performed at [facility]  Final   Culture PENDING  Incomplete   Report Status PENDING  Incomplete  MRSA PCR Screening     Status: None   Collection Time: 07/07/16  4:03 PM  Result Value Ref Range Status   MRSA by PCR NEGATIVE NEGATIVE Final    Comment:        The GeneXpert MRSA Assay (FDA approved for NASAL specimens only), is one component of a comprehensive MRSA colonization surveillance program. It is not intended to diagnose MRSA infection nor to guide or monitor treatment for MRSA infections.          Radiology Studies: Dg Chest  2 View  Result Date: 07/07/2016 CLINICAL DATA:  SOB and weakness today; Pt is here due to feeling progressively worse over the past week. Pt has been having unquenchable thirst and frequent urination. Pt has been developing increasing generalized weakness and body aches. Pt appears dehydrated and has a very dry mouth. Pt found to be hyperglycemic in triage with no previously known history of diabetes; HTN; non smoker EXAM: CHEST  2 VIEW COMPARISON:  None. FINDINGS: The heart size and mediastinal contours are within normal limits. Both lungs are clear. No pleural effusion or pneumothorax. The visualized skeletal structures are unremarkable. IMPRESSION: No active cardiopulmonary disease. Electronically Signed   By: Lajean Manes M.D.   On: 07/07/2016 13:19        Scheduled Meds: . famotidine  20 mg Oral Daily  . heparin  5,000 Units Subcutaneous Q8H  . insulin aspart  0-20 Units Subcutaneous TID WC  . insulin aspart  0-5 Units Subcutaneous QHS  . insulin glargine  32 Units Subcutaneous QHS  . nebivolol  5 mg Oral Daily   Continuous Infusions: . sodium  chloride Stopped (07/07/16 2036)  . sodium chloride 125 mL/hr at 07/08/16 0815     LOS: 1 day    Time spent: 68 mins    Diani Jillson,Zephaniah, MD [facility] Pager [phone]   If 7PM-7AM, please contact night-coverage [email or web address] Password [facility] 07/08/2016, 10:31 AM

## 2016-07-09 DIAGNOSIS — E663 Overweight: Secondary | ICD-10-CM

## 2016-07-09 DIAGNOSIS — E43 Unspecified severe protein-calorie malnutrition: Secondary | ICD-10-CM

## 2016-07-09 LAB — CBC
HCT: 35.7 % — ABNORMAL LOW (ref 39.0–52.0)
Hemoglobin: 12.2 g/dL — ABNORMAL LOW (ref 13.0–17.0)
MCH: 31.9 pg (ref 26.0–34.0)
MCHC: 34.2 g/dL (ref 30.0–36.0)
MCV: 93.5 fL (ref 78.0–100.0)
Platelets: 224 10*3/uL (ref 150–400)
RBC: 3.82 MIL/uL — ABNORMAL LOW (ref 4.22–5.81)
RDW: 12.4 % (ref 11.5–15.5)
WBC: 4.5 10*3/uL (ref 4.0–10.5)

## 2016-07-09 LAB — URINE CULTURE: Culture: NO GROWTH

## 2016-07-09 LAB — BASIC METABOLIC PANEL
Anion gap: 9 (ref 5–15)
BUN: 22 mg/dL — AB (ref 6–20)
CALCIUM: 8.4 mg/dL — AB (ref 8.9–10.3)
CO2: 21 mmol/L — ABNORMAL LOW (ref 22–32)
CREATININE: 1.18 mg/dL (ref 0.61–1.24)
Chloride: 106 mmol/L (ref 101–111)
GFR calc Af Amer: >60 mL/min (ref >60)
GLUCOSE: 246 mg/dL — AB (ref 65–99)
Potassium: 3.4 mmol/L — ABNORMAL LOW (ref 3.5–5.1)
SODIUM: 136 mmol/L (ref 135–145)

## 2016-07-09 LAB — HEMOGLOBIN A1C
Hgb A1c MFr Bld: 12 % — ABNORMAL HIGH (ref 4.8–5.6)
Mean Plasma Glucose: 298 mg/dL

## 2016-07-09 LAB — GLUCOSE, CAPILLARY
Glucose-Capillary: 259 mg/dL — ABNORMAL HIGH (ref 65–99)
Glucose-Capillary: 288 mg/dL — ABNORMAL HIGH (ref 65–99)
Glucose-Capillary: 250 mg/dL — ABNORMAL HIGH (ref 65–99)

## 2016-07-09 MED ORDER — POTASSIUM CHLORIDE CRYS ER 20 MEQ PO TBCR: 40.0000 meq | EXTENDED_RELEASE_TABLET | ORAL | Status: DC

## 2016-07-09 MED ORDER — INSULIN GLARGINE 100 UNIT/ML SOLOSTAR PEN: 40.0000 [IU] | PEN_INJECTOR | Freq: Every day | SUBCUTANEOUS | 6 refills | Status: DC

## 2016-07-09 MED ORDER — BLOOD GLUCOSE METER KIT: PACK | 0 refills | Status: DC

## 2016-07-09 MED ORDER — INSULIN ASPART 100 UNIT/ML ~~LOC~~ SOLN
6.0000 [IU] | Freq: Three times a day (TID) | SUBCUTANEOUS | Status: DC
  Administered 2016-07-09: 6 [IU] via SUBCUTANEOUS

## 2016-07-09 MED ORDER — "PEN NEEDLES 3/16"" 31G X 5 MM MISC": 6.0000 [IU] | Freq: Three times a day (TID) | 6 refills | Status: DC

## 2016-07-09 MED ORDER — NEBIVOLOL HCL 10 MG PO TABS: 10.0000 mg | ORAL_TABLET | Freq: Every day | ORAL | 3 refills | Status: DC

## 2016-07-09 MED ORDER — INSULIN ASPART 100 UNIT/ML FLEXPEN: 6.0000 [IU] | PEN_INJECTOR | Freq: Three times a day (TID) | SUBCUTANEOUS | 6 refills | Status: DC

## 2016-07-09 MED ORDER — PANTOPRAZOLE SODIUM 40 MG PO TBEC: 40.0000 mg | DELAYED_RELEASE_TABLET | Freq: Every day | ORAL | 3 refills | Status: DC

## 2016-07-09 MED ORDER — INSULIN GLARGINE 100 UNIT/ML ~~LOC~~ SOLN
40.0000 [IU] | Freq: Every day | SUBCUTANEOUS | Status: DC
  Filled 2016-07-09: qty 0.4

## 2016-07-09 MED ORDER — AMLODIPINE BESYLATE 10 MG PO TABS: 10.0000 mg | ORAL_TABLET | Freq: Every day | ORAL | 3 refills | Status: DC

## 2016-07-09 MED ORDER — POTASSIUM CHLORIDE CRYS ER 20 MEQ PO TBCR
40.0000 meq | EXTENDED_RELEASE_TABLET | Freq: Once | ORAL | Status: AC
  Administered 2016-07-09: 40 meq via ORAL
  Filled 2016-07-09: qty 2

## 2016-07-09 MED ORDER — AMLODIPINE BESYLATE 10 MG PO TABS
10.0000 mg | ORAL_TABLET | Freq: Every day | ORAL | Status: DC
  Administered 2016-07-09: 10 mg via ORAL
  Filled 2016-07-09: qty 1

## 2016-07-09 NOTE — Discharge Summary (Signed)
Physician Discharge Summary  Keith Leblanc XHB:[id number] DOB: September 07, 1965 DOA: 07/07/2016  PCP: Dorothyann Peng, NP  Admit date: 07/07/2016 Discharge date: 07/09/2016  Time spent: 65 minutes  Recommendations for Outpatient Follow-up:  1. Follow-up with Dorothyann Peng, NP in 1-2 weeks. On follow-up patient's diabetes will need to be reassessed. Patient's hypertension also needs to be assessed as patient's ARB was discontinued. Patient will need a basic metabolic profile done to follow-up on electrolytes and renal function.   Discharge Diagnoses:  Principal Problem:   Diabetic ketoacidosis without coma associated with type 2 diabetes mellitus ([place]) Active Problems:   Essential hypertension   Gout   Acute renal failure (ARF) (HCC)   Hyponatremia   Hyperkalemia   Obesity (BMI 35.0-39.9 without comorbidity)   Tobacco abuse   Overweight (BMI 25.0-29.9)   Protein-calorie malnutrition, severe   Discharge Condition: stable and improved.  Diet recommendation: Carb modified.  Filed Weights   07/07/16 1028 07/07/16 1605  Weight: 122 kg (269 lb) 109.5 kg (241 lb 6.5 oz)    History of present illness:  Per Dr Guerry Minors is a 51 y.o. male with PMH significant for for HTN, overweight, gout, tobacco abuse and marijuana use; presented to ED with polyuria, polydipsia, generalized weakness and extreme fatigue. Patient denied CP, SOB, sick contacts, fever, chills, dysuria, hematuria, melena, hematemesis or any other complaints.  79fnote; symptoms had been present for over 1-2 weeks and gradually worsening.   ED Course: found to be in DKA, 2L IVF's given and glucostabilizer initiated. [facility] called to admit patient for further evaluation and treatment.    Hospital Course:  #1 diabetic ketoacidosis without, associated with new onset type 2 diabetes mellitus Patient had presented with diabetic ketoacidosis with her blood sugar of 1081. Patient also presented with polyuria, polydipsia,  weakness and generalized fatigue. Patient noted to have an anion gap of 29 with a bicarbonate of 14. Patient with no signs or symptoms of infection. EKG and troponin negative for any acute ischemia. Patient was placed on the insulin drip. Hyperglycemia/DKA improved and patient was subsequently transitioned to subcutaneous insulin. Hemoglobin A1c was 12.0. Patient's Lantus dose was adjusted to 40 units daily at bedtime and patient was placed on NovoLog 6 units 3 times daily with meals. Patient was also placed on a sliding scale insulin with improvement with his CBGs. Patient was seen by the diabetic coordinator for diabetes education. Patient is to follow-up with PCP one to 2 weeks postdischarge.   #2 acute renal failure Likely secondary to a prerenal azotemia in the setting of dehydration from DKA and ARB. ARB and NSAIDs have been discontinued. Renal function improved with hydration with good urine output. Patient's   creatinine trended down from 2.76  to 1.18 by day of discharge. Patient's ARB was discontinued on discharge. Outpatient follow-up.   #3 hyponatremia Resolved with hydration.  #4 hypertension Blood pressure was borderline on admission and as such some of patient's antihypertensive medications were held. BP improved with hydration and patient was resumed back on home dose bystolic 10 mg daily as well as Norvasc 10 mg daily and Cardura 4 mg daily. Patient's ARB was discontinued secondary to presentation of acute renal failure. Outpatient follow-up.  #5 tobacco abuse Tobacco cessation.  #6 pseudo hyperkalemia Resolved.   Procedures:  Chest x-ray 07/07/2016  Consultations:  None  Discharge Exam: Vitals:   07/09/16 0644 07/09/16 0836  BP: (!) 154/84 (!) 155/91  Pulse: (!) 50 (!) 52  Resp: 18 18  Temp: 98.4  F (36.9 C) 98.8 F (37.1 C)    General: NAD Cardiovascular: RRR Respiratory: CTAB  Discharge Instructions   Discharge Instructions    Ambulatory referral  to Nutrition and Diabetic Education    Complete by:  As directed    New diagnosis of DM.  Hemoglobin A1c pending.  Likely d/c home on insulin.  Patient: Please call the [facility] after discharge to schedule an appointment for diabetes education if you do not hear from the center before discharge  [phone]   Diet Carb Modified    Complete by:  As directed    Increase activity slowly    Complete by:  As directed      Current Discharge Medication List    START taking these medications   Details  amLODipine (NORVASC) 10 MG tablet Take 1 tablet (10 mg total) by mouth daily. Qty: 30 tablet, Refills: 3    blood glucose meter kit and supplies Dispense based on patient and insurance preference. Use up to four times daily as directed. (FOR ICD-9 250.00, 250.01). Qty: 1 each, Refills: 0    insulin aspart (NOVOLOG FLEXPEN) 100 UNIT/ML FlexPen Inject 6 Units into the skin 3 (three) times daily with meals. Qty: 15 mL, Refills: 6    Insulin Glargine (LANTUS SOLOSTAR) 100 UNIT/ML Solostar Pen Inject 40 Units into the skin daily at 10 pm. Qty: 15 mL, Refills: 6    Insulin Pen Needle (PEN NEEDLES 3/16") 31G X 5 MM MISC 6-40 Units by Does not apply route 4 (four) times daily - after meals and at bedtime. Qty: 100 each, Refills: 6    pantoprazole (PROTONIX) 40 MG tablet Take 1 tablet (40 mg total) by mouth daily at 6 (six) AM. Qty: 30 tablet, Refills: 3      CONTINUE these medications which have CHANGED   Details  nebivolol (BYSTOLIC) 10 MG tablet Take 1 tablet (10 mg total) by mouth daily. Qty: 30 tablet, Refills: 3      CONTINUE these medications which have NOT CHANGED   Details  doxazosin (CARDURA) 4 MG tablet TAKE 1 TABLET BY MOUTH DAILY Qty: 90 tablet, Refills: 1   Associated Diagnoses: Essential hypertension    Cod Liver Oil CAPS Take 1 capsule by mouth daily.    colchicine 0.6 MG tablet Take 0.6 mg by mouth 2 (two) times daily.  Reported on 06/09/2015 Refills: 0    indomethacin (INDOCIN) 50 MG capsule TAKE ONE CAPSULE BY MOUTH 3 TIMES A DAY Qty: 30 capsule, Refills: 5    VIAGRA 100 MG tablet ONE TABLET BY MOUTH AS DIRECTED DAILY AS NEEDED Refills: 3      STOP taking these medications     amLODipine-valsartan (EXFORGE) 10-320 MG tablet        No Known Allergies Follow-up Information    Dorothyann Peng, NP. Schedule an appointment as soon as possible for a visit in 1 week(s).   Specialty:  Family Medicine Why:  f/u in 1-2 weeks. Contact information: [address] [place] [address] [phone]            The results of significant diagnostics from this hospitalization (including imaging, microbiology, ancillary and laboratory) are listed below for reference.    Significant Diagnostic Studies: Dg Chest 2 View  Result Date: 07/07/2016 CLINICAL DATA:  SOB and weakness today; Pt is here due to feeling progressively worse over the past week. Pt has been having unquenchable thirst and frequent urination. Pt has been developing  increasing generalized weakness and body aches. Pt appears dehydrated and has a very dry mouth. Pt found to be hyperglycemic in triage with no previously known history of diabetes; HTN; non smoker EXAM: CHEST  2 VIEW COMPARISON:  None. FINDINGS: The heart size and mediastinal contours are within normal limits. Both lungs are clear. No pleural effusion or pneumothorax. The visualized skeletal structures are unremarkable. IMPRESSION: No active cardiopulmonary disease. Electronically Signed   By: Lajean Manes M.D.   On: 07/07/2016 13:19    Microbiology: Recent Results (from the past 240 hour(s))  Urine culture     Status: None   Collection Time: 07/07/16 10:39 AM  Result Value Ref Range Status   Specimen Description URINE, RANDOM  Final   Special Requests NONE  Final   Culture NO GROWTH Performed at [facility]   Final   Report Status 07/09/2016 FINAL  Final   Blood culture (routine x 2)     Status: None (Preliminary result)   Collection Time: 07/07/16 12:16 PM  Result Value Ref Range Status   Specimen Description BLOOD LEFT AC  Final   Special Requests BOTTLES DRAWN AEROBIC AND ANAEROBIC 5CC  Final   Culture   Final    NO GROWTH < 24 HOURS Performed at [facility]    Report Status PENDING  Incomplete  Blood culture (routine x 2)     Status: None (Preliminary result)   Collection Time: 07/07/16 12:25 PM  Result Value Ref Range Status   Specimen Description BLOOD LEFT ANTECUBITAL  Final   Special Requests BOTTLES DRAWN AEROBIC AND ANAEROBIC 5CC  Final   Culture   Final    NO GROWTH < 24 HOURS Performed at [facility]    Report Status PENDING  Incomplete  MRSA PCR Screening     Status: None   Collection Time: 07/07/16  4:03 PM  Result Value Ref Range Status   MRSA by PCR NEGATIVE NEGATIVE Final    Comment:        The GeneXpert MRSA Assay (FDA approved for NASAL specimens only), is one component of a comprehensive MRSA colonization surveillance program. It is not intended to diagnose MRSA infection nor to guide or monitor treatment for MRSA infections.      Labs: Basic Metabolic Panel:  Recent Labs Lab 07/07/16 1805 07/07/16 2119 07/08/16 0054 07/08/16 0813 07/09/16 0635  NA 139 141 142 140 136  K 4.0 5.5* 3.5 4.2 3.4*  CL 103 108 109 107 106  CO2 19* 20* 24 21* 21*  GLUCOSE 405* 193* 150* 240* 246*  BUN 52* 50* 47* 41* 22*  CREATININE 2.31* 1.85* 1.78* 1.74* 1.18  CALCIUM 8.9 8.8* 8.8* 8.5* 8.4*  MG 3.3*  --   --   --   --   PHOS 2.9  --   --   --   --    Liver Function Tests: No results for input(s): AST, ALT, ALKPHOS, BILITOT, PROT, ALBUMIN in the last 168 hours. No results for input(s): LIPASE, AMYLASE in the last 168 hours. No results for input(s): AMMONIA in the last 168 hours. CBC:  Recent Labs Lab 07/07/16 1059 07/07/16 1805 07/09/16 0635  WBC 7.7 9.6 4.5  HGB 14.3 14.0 12.2*   HCT 42.9 41.0 35.7*  MCV 96.4 93.0 93.5  PLT 354 309 224   Cardiac Enzymes: No results for input(s): CKTOTAL, CKMB, CKMBINDEX, TROPONINI in the last 168 hours. BNP: BNP (last 3 results) No results for input(s): BNP in  the last 8760 hours.  ProBNP (last 3 results) No results for input(s): PROBNP in the last 8760 hours.  CBG:  Recent Labs Lab 07/08/16 1147 07/08/16 1703 07/08/16 2123 07/09/16 0741 07/09/16 1111  GLUCAP 296* 353* 260* 259* 288*       Signed:  THOMPSON,Aeson MD.  [facility] 07/09/2016, 2:40 PM

## 2016-07-09 NOTE — Progress Notes (Signed)
Inpatient Diabetes Program Recommendations  AACE/ADA: New Consensus Statement on Inpatient Glycemic Control (2015)  Target Ranges:  Prepandial:   less than 140 mg/dL      Peak postprandial:   less than 180 mg/dL (1-2 hours)      Critically ill patients:  140 - 180 mg/dL   Results for Keith Leblanc, Keith Leblanc (MRN [id number]) as of 07/09/2016 07:54  Ref. Range 07/08/2016 07:44 07/08/2016 11:47 07/08/2016 17:03 07/08/2016 21:23 07/09/2016 07:41  Glucose-Capillary Latest Ref Range: 65 - 99 mg/dL 217 (H) 296 (H) 353 (H) 260 (H) 259 (H)   Results for Keith Leblanc, Keith Leblanc (MRN [id number]) as of 07/09/2016 07:54  Ref. Range 07/07/2016 18:05  Hemoglobin A1C Latest Ref Range: 4.8 - 5.6 % 12.0 (H)    Admit with: DKA/ New Diagnosis of DM  Current Insulin Orders: Lantus 36 units QHS                                       Novolog Resistant Correction Scale/ SSI (0-20 units) TID AC + HS     MD- Note Lantus increased to 36 units QHS last PM.  Fasting glucose this AM still elevated to 259 mg/dl.  Please consider the following:  1. Increase Lantus further to 40 units QHS  2. Start Novolog Meal Coverage: Novolog 6 units TID with meals (hold if pt eats <50% of meal)     Patient will likely prefer insulin pens at time of discharge.  Has [employer].  Please make sure to give pt the following Rxs at time of d/c:  1. Lantus insulin pen- Order # [id number]  2. Novolog insulin pen- Order # [id number]. Insulin pen needles- Order # [id number]. CBG meter and supplies- Order # [id number]     --Will follow patient during hospitalization--  Wyn Quaker RN, MSN, CDE Diabetes Coordinator Inpatient Glycemic Control Team Team Pager: [phone] (8a-5p)

## 2016-07-09 NOTE — Progress Notes (Signed)
Patient given discharge instructions, and verbalized an understanding of all discharge instructions.  Patient agrees with discharge plan, and is being discharged in stable medical condition.  Patient watched all diabetes videos on patient education network.

## 2016-07-09 NOTE — Progress Notes (Signed)
  RD consulted for nutrition education regarding diabetes.   Lab Results  Component Value Date   HGBA1C 12.0 (H) 07/07/2016    RD provided "Carbohydrate Counting for People with Diabetes" handout from the Academy of Nutrition and Dietetics. Discussed different food groups and their effects on blood sugar, emphasizing carbohydrate-containing foods. Provided list of carbohydrates and recommended serving sizes of common foods.  Discussed importance of controlled and consistent carbohydrate intake throughout the day. Provided examples of ways to balance meals/snacks and encouraged intake of high-fiber, whole grain complex carbohydrates. Teach back method used.  Expect good compliance.  Body mass index is 27.9 kg/m. Pt meets criteria for overweight based on current BMI.  Current diet order is carbohydrate controlled, patient is consuming approximately 100% of meals at this time. Labs and medications reviewed. No further nutrition interventions warranted at this time. RD contact information provided. If additional nutrition issues arise, please re-consult RD.  Koleen Distance, RD, LDN Pager #- [phone]

## 2016-07-09 NOTE — Progress Notes (Signed)
Met with pt again today.  Re-reviewed basic DM survival skills with patient.  Also discussed the difference between Lantus and Novolog with patient, when to take, how to use, etc.  Educated patient on insulin pen use at home.  Reviewed contents of insulin flexpen starter kit.  Reviewed all steps of insulin pen including attachment of needle, 2-unit air shot, dialing up dose, giving injection, removing needle, disposal of sharps, storage of unused insulin, disposal of insulin etc.  Patient able to provide successful return demonstration.  Reviewed troubleshooting with insulin pen.  Also reviewed Signs/Symptoms of Hypoglycemia with patient and how to treat Hypoglycemia at home.  Have asked RNs caring for patient to please allow patient to give all injections here in hospital as much as possible for practice.  MD to give patient Rxs for insulin pens and insulin pen needles.  RN to allow pt to continue giving all injections in hospital and also to allow pt to practice checking fingerstick glucose levels.  RD to also meet with pt today.   --Will follow patient during hospitalization--  Wyn Quaker RN, MSN, CDE Diabetes Coordinator Inpatient Glycemic Control Team Team Pager: [phone] (8a-5p)

## 2016-07-12 LAB — CULTURE, BLOOD (ROUTINE X 2)
CULTURE: NO GROWTH
Culture: NO GROWTH

## 2016-07-16 ENCOUNTER — Encounter: Payer: Self-pay | Admitting: Adult Health

## 2016-07-16 ENCOUNTER — Ambulatory Visit (INDEPENDENT_AMBULATORY_CARE_PROVIDER_SITE_OTHER): Payer: 59 | Admitting: Adult Health

## 2016-07-16 VITALS — BP 128/76 | Temp 98.6°F | Wt 261.7 lb

## 2016-07-16 DIAGNOSIS — G479 Sleep disorder, unspecified: Secondary | ICD-10-CM

## 2016-07-16 DIAGNOSIS — I10 Essential (primary) hypertension: Secondary | ICD-10-CM

## 2016-07-16 DIAGNOSIS — E1165 Type 2 diabetes mellitus with hyperglycemia: Secondary | ICD-10-CM

## 2016-07-16 DIAGNOSIS — IMO0001 Reserved for inherently not codable concepts without codable children: Secondary | ICD-10-CM

## 2016-07-16 LAB — BASIC METABOLIC PANEL
BUN: 15 mg/dL (ref 6–23)
CHLORIDE: 104 meq/L (ref 96–112)
CO2: 28 meq/L (ref 19–32)
Calcium: 9.6 mg/dL (ref 8.4–10.5)
Creatinine, Ser: 1.11 mg/dL (ref 0.40–1.50)
GFR: 90.76 mL/min (ref >60.00)
GLUCOSE: 133 mg/dL — AB (ref 70–99)
POTASSIUM: 4 meq/L (ref 3.5–5.1)
SODIUM: 138 meq/L (ref 135–145)

## 2016-07-16 NOTE — Progress Notes (Addendum)
Subjective:    Patient ID: Keith Leblanc, male    DOB: [birth year], 51 y.o.   MRN: [id number]  HPI  51 year old male who  has a past medical history of Blood in stool; Chicken pox; Erectile dysfunction; Gout; and Hypertension. Had recent hospital admission from 07/07/2016 to 07/09/2016 for DKA. He presented to the ER with polyuria, polydipsia, fatigue and generalized weakness. His symptoms were present for 1-2 weeks but were gradually worsening. His A1c was 12 and his blood sugar at that time in the ER was found to be greater than 1000.   In the hospital stay, lisinopril was discontinued for acute renal failure due to dehydration.  In the hospital when his blood sugars were slightly elevated he reported blurred vision. Since being discharged he reports that although his vision is still little bit blurry he's noticed significant improvement in his blood sugars have come down.  Since being discharged from the hospital he's been monitoring his blood sugars and diet very closely. First week being discharged from the hospital his blood sugars range from 170s to mid 200s. He has been taking his insulin regimen as directed. Course of this week his blood sugars have been more stable in the 1 teens to 140s.  He reports that he is having trouble sleeping at night and that he is also feeling a little bit "down" since being diagnosed with diabetes. He denies depression or suicidal ideation has his days where he just doesn't feel like himself.  He was advised in the hospital to follow-up with endocrinology for diabetes management and he feels as though this would be a good option for him.   Review of Systems  Constitutional: Negative.   HENT: Negative.   Respiratory: Negative.   Cardiovascular: Negative.   Gastrointestinal: Negative.   Genitourinary: Negative.   Musculoskeletal: Negative.   Neurological: Negative.   Psychiatric/Behavioral: Positive for sleep disturbance. Negative for self-injury and  suicidal ideas. The patient is not nervous/anxious.   All other systems reviewed and are negative.  Past Medical History:  Diagnosis Date  . Blood in stool    bright red blood   . Chicken pox   . Diabetes mellitus ([place])   . Erectile dysfunction   . Gout   . Hypertension     Social History   Social History  . Marital status: Single    Spouse name: N/A  . Number of children: N/A  . Years of education: N/A   Occupational History  . Not on file.   Social History Main Topics  . Smoking status: Never Smoker  . Smokeless tobacco: Never Used  . Alcohol use 0.0 oz/week     Comment: 3-4  . Drug use: Yes    Types: Marijuana  . Sexual activity: Yes   Other Topics Concern  . Not on file   Social History Narrative   Works for [employer]    Not married    One daughter who does not live with him    Likes to gamble, walk in the parks, travel.     Past Surgical History:  Procedure Laterality Date  . APPENDECTOMY  2004    Family History  Problem Relation Age of Onset  . Alcohol abuse Father   . Hypertension Father   . Heart disease Father   . Breast cancer Mother   . Hypertension Mother     No Known Allergies  Current Outpatient Prescriptions on File Prior to Visit  Medication Sig Dispense Refill  .  amLODipine (NORVASC) 10 MG tablet Take 1 tablet (10 mg total) by mouth daily. 30 tablet 3  . blood glucose meter kit and supplies Dispense based on patient and insurance preference. Use up to four times daily as directed. (FOR ICD-9 250.00, 250.01). 1 each 0  . Cod Liver Oil CAPS Take 1 capsule by mouth daily.    . colchicine 0.6 MG tablet Take 0.6 mg by mouth 2 (two) times daily. Reported on 06/09/2015  0  . doxazosin (CARDURA) 4 MG tablet TAKE 1 TABLET BY MOUTH DAILY 90 tablet 1  . indomethacin (INDOCIN) 50 MG capsule TAKE ONE CAPSULE BY MOUTH 3 TIMES A DAY 30 capsule 5  . insulin aspart (NOVOLOG FLEXPEN) 100 UNIT/ML FlexPen Inject 6 Units into the skin 3 (three) times daily  with meals. 15 mL 6  . Insulin Glargine (LANTUS SOLOSTAR) 100 UNIT/ML Solostar Pen Inject 40 Units into the skin daily at 10 pm. 15 mL 6  . Insulin Pen Needle (PEN NEEDLES 3/16") 31G X 5 MM MISC 6-40 Units by Does not apply route 4 (four) times daily - after meals and at bedtime. 100 each 6  . nebivolol (BYSTOLIC) 10 MG tablet Take 1 tablet (10 mg total) by mouth daily. 30 tablet 3  . pantoprazole (PROTONIX) 40 MG tablet Take 1 tablet (40 mg total) by mouth daily at 6 (six) AM. 30 tablet 3  . VIAGRA 100 MG tablet ONE TABLET BY MOUTH AS DIRECTED DAILY AS NEEDED  3   No current facility-administered medications on file prior to visit.     BP 128/76   Temp 98.6 F (37 C) (Oral)   Wt 261 lb 11.2 oz (118.7 kg)   BMI 30.24 kg/m       Objective:   Physical Exam  Constitutional: He is oriented to person, place, and time. He appears well-developed and well-nourished. No distress.  Cardiovascular: Normal rate, regular rhythm, normal heart sounds and intact distal pulses.  Exam reveals no gallop and no friction rub.   No murmur heard. Pulmonary/Chest: Effort normal and breath sounds normal. No respiratory distress. He has no wheezes. He has no rales. He exhibits no tenderness.  Musculoskeletal: Normal range of motion. He exhibits no edema, tenderness or deformity.  Neurological: He is alert and oriented to person, place, and time.  Skin: Skin is warm and dry. No rash noted. He is not diaphoretic. No erythema. No pallor.  Psychiatric: He has a normal mood and affect. His behavior is normal. Judgment and thought content normal.  Nursing note and vitals reviewed.     Assessment & Plan:  1. Uncontrolled diabetes mellitus type 2 without complications, unspecified long term insulin use status ([place]) - Continue to work on diet as well as exercise regimens. - Close monitor of blood sugar levels 3 times a day - Ambulatory referral to Endocrinology - Basic metabolic panel - Follow-up in 2 weeks or  sooner if needed 2. Sleep disturbance - Advised to try over-the-counter melatonin  3. Essential hypertension - Blood pressure continues to be at goal despite being on lisinopril. - We'll keep on amlodipine - Continue to monitor   Dorothyann Peng, NP

## 2016-07-16 NOTE — Patient Instructions (Signed)
I will follow up with you regarding labs.   Continue to work on diet and exercise  Follow up with me in two weeks or sooner if needed

## 2016-07-23 ENCOUNTER — Ambulatory Visit: Payer: 59 | Admitting: Adult Health

## 2016-07-23 ENCOUNTER — Encounter: Payer: 59 | Attending: Adult Health | Admitting: *Deleted

## 2016-07-23 DIAGNOSIS — Z713 Dietary counseling and surveillance: Secondary | ICD-10-CM | POA: Insufficient documentation

## 2016-07-23 DIAGNOSIS — E1310 Other specified diabetes mellitus with ketoacidosis without coma: Secondary | ICD-10-CM | POA: Diagnosis present

## 2016-07-23 DIAGNOSIS — E1110 Type 2 diabetes mellitus with ketoacidosis without coma: Secondary | ICD-10-CM

## 2016-07-25 NOTE — Progress Notes (Signed)
Patient was seen on 07/23/2016 for the first of a series of three diabetes self-management courses at the [facility].  Patient Education Plan per assessed needs and concerns is to attend four course education program for Diabetes Self Management Education.  The following learning objectives were met by the patient during this class:  Describe diabetes  State some common risk factors for diabetes  Defines the role of glucose and insulin  Identifies type of diabetes and pathophysiology  Describe the relationship between diabetes and cardiovascular risk  State the members of the Healthcare Team  States the rationale for glucose monitoring  State when to test glucose  State their individual Target Range  State the importance of logging glucose readings  Describe how to interpret glucose readings  Identifies A1C target  Explain the correlation between A1c and eAG values  State symptoms and treatment of high blood glucose  State symptoms and treatment of low blood glucose  Explain proper technique for glucose testing  Identifies proper sharps disposal  Handouts given during class include:  Living Well with Diabetes book  Carb Counting and Meal Planning book  Meal Plan Card  Carbohydrate guide  Meal planning worksheet  Low Sodium Flavoring Tips  The diabetes portion plate  Y7Z to eAG Conversion Chart  Diabetes Medications  Diabetes Recommended Care Schedule  Support Group  Diabetes Success Plan  Core Class Satisfaction Survey  Follow-Up Plan:  Attend core 2

## 2016-07-30 ENCOUNTER — Encounter: Payer: BLUE CROSS/BLUE SHIELD | Attending: Adult Health | Admitting: *Deleted

## 2016-07-30 ENCOUNTER — Other Ambulatory Visit: Payer: Self-pay | Admitting: Adult Health

## 2016-07-30 DIAGNOSIS — E1310 Other specified diabetes mellitus with ketoacidosis without coma: Secondary | ICD-10-CM | POA: Diagnosis present

## 2016-07-30 DIAGNOSIS — Z713 Dietary counseling and surveillance: Secondary | ICD-10-CM | POA: Insufficient documentation

## 2016-07-30 DIAGNOSIS — E1110 Type 2 diabetes mellitus with ketoacidosis without coma: Secondary | ICD-10-CM

## 2016-07-30 DIAGNOSIS — N529 Male erectile dysfunction, unspecified: Secondary | ICD-10-CM

## 2016-07-30 NOTE — Progress Notes (Signed)

## 2016-07-30 NOTE — Telephone Encounter (Signed)
Ok to refill with 3 additional

## 2016-08-01 ENCOUNTER — Ambulatory Visit (INDEPENDENT_AMBULATORY_CARE_PROVIDER_SITE_OTHER): Payer: 59 | Admitting: Adult Health

## 2016-08-01 ENCOUNTER — Encounter: Payer: Self-pay | Admitting: Adult Health

## 2016-08-01 VITALS — BP 142/84 | Temp 98.4°F | Ht 77.5 in | Wt 262.8 lb

## 2016-08-01 DIAGNOSIS — E1110 Type 2 diabetes mellitus with ketoacidosis without coma: Secondary | ICD-10-CM

## 2016-08-01 DIAGNOSIS — E1310 Other specified diabetes mellitus with ketoacidosis without coma: Secondary | ICD-10-CM | POA: Diagnosis not present

## 2016-08-01 DIAGNOSIS — I10 Essential (primary) hypertension: Secondary | ICD-10-CM | POA: Diagnosis not present

## 2016-08-01 LAB — POCT GLYCOSYLATED HEMOGLOBIN (HGB A1C): HEMOGLOBIN A1C: 9.6

## 2016-08-01 NOTE — Patient Instructions (Signed)
Your A1c has dropped from 12 to 9.6 in one month! That is amazing!  I am going to keep you on insulin therapy until you are seen by Endocrinology  Keep working on the diet and exercise

## 2016-08-01 NOTE — Progress Notes (Signed)
Subjective:    Patient ID: Keith Leblanc, male    DOB: [birth year], 51 y.o.   MRN: [id number]  HPI  51 year old male who  has a past medical history of Blood in stool; Chicken pox; Diabetes mellitus ([place]); Erectile dysfunction; Gout; and Hypertension.  He presents to the office today for two week follow up of diabetes management as a newly diagnosed diabetic. When I saw him two weeks ago after being discharged from the hospital s/p DKA. He had been checking his blood sugars closely and was starting to monitor his diet. First week being discharged from the hospital his blood sugars range from 170s to mid 200s. He has been taking his insulin regimen as directed. Course of this week his blood sugars have been more stable in the 1 teens to 140s.  His A1c one month ago was 12.0.   Today in the office he reports that he continues to work on his diet and has started walking in the park. His blood sugars have been in the one teens, with the lowest being 77. He has been going to the diabetic education classes and has found then helpful, especially when it comes to his diet.   He has been able to lose 3 pounds since the last visit.   Wt Readings from Last 3 Encounters:  08/01/16 262 lb 12.8 oz (119.2 kg)  07/23/16 265 lb (120.2 kg)  07/16/16 261 lb 11.2 oz (118.7 kg)   He denies any complications   His blood pressure is up a little bit today at 142/82   Review of Systems  Constitutional: Negative.   Respiratory: Negative.   Cardiovascular: Negative.   Gastrointestinal: Negative.   Genitourinary: Negative.   Neurological: Negative.   All other systems reviewed and are negative.  Past Medical History:  Diagnosis Date  . Blood in stool    bright red blood   . Chicken pox   . Diabetes mellitus ([place])   . Erectile dysfunction   . Gout   . Hypertension     Social History   Social History  . Marital status: Single    Spouse name: N/A  . Number of children: N/A  . Years of  education: N/A   Occupational History  . Not on file.   Social History Main Topics  . Smoking status: Never Smoker  . Smokeless tobacco: Never Used  . Alcohol use 0.0 oz/week     Comment: 3-4  . Drug use: Yes    Types: Marijuana  . Sexual activity: Yes   Other Topics Concern  . Not on file   Social History Narrative   Works for [employer]    Not married    One daughter who does not live with him    Likes to gamble, walk in the parks, travel.     Past Surgical History:  Procedure Laterality Date  . APPENDECTOMY  2004    Family History  Problem Relation Age of Onset  . Alcohol abuse Father   . Hypertension Father   . Heart disease Father   . Breast cancer Mother   . Hypertension Mother     No Known Allergies  Current Outpatient Prescriptions on File Prior to Visit  Medication Sig Dispense Refill  . amLODipine (NORVASC) 10 MG tablet Take 1 tablet (10 mg total) by mouth daily. 30 tablet 3  . blood glucose meter kit and supplies Dispense based on patient and insurance preference. Use up to four times daily as  directed. (FOR ICD-9 250.00, 250.01). 1 each 0  . Cod Liver Oil CAPS Take 1 capsule by mouth daily.    . colchicine 0.6 MG tablet Take 0.6 mg by mouth 2 (two) times daily. Reported on 06/09/2015  0  . doxazosin (CARDURA) 4 MG tablet TAKE 1 TABLET BY MOUTH DAILY 90 tablet 1  . indomethacin (INDOCIN) 50 MG capsule TAKE ONE CAPSULE BY MOUTH 3 TIMES A DAY 30 capsule 5  . insulin aspart (NOVOLOG FLEXPEN) 100 UNIT/ML FlexPen Inject 6 Units into the skin 3 (three) times daily with meals. 15 mL 6  . Insulin Glargine (LANTUS SOLOSTAR) 100 UNIT/ML Solostar Pen Inject 40 Units into the skin daily at 10 pm. 15 mL 6  . Insulin Pen Needle (PEN NEEDLES 3/16") 31G X 5 MM MISC 6-40 Units by Does not apply route 4 (four) times daily - after meals and at bedtime. 100 each 6  . nebivolol (BYSTOLIC) 10 MG tablet Take 1 tablet (10 mg total) by mouth daily. 30 tablet 3  . pantoprazole  (PROTONIX) 40 MG tablet Take 1 tablet (40 mg total) by mouth daily at 6 (six) AM. 30 tablet 3  . sildenafil (REVATIO) 20 MG tablet TAKE 2-5 TABLETS BY MOUTH AS NEEDED FOR SEXUAL ACTIVITY 50 tablet 3  . VIAGRA 100 MG tablet ONE TABLET BY MOUTH AS DIRECTED DAILY AS NEEDED  3   No current facility-administered medications on file prior to visit.     BP (!) 142/84   Temp 98.4 F (36.9 C) (Oral)   Ht 6' 5.5" (1.969 m)   Wt 262 lb 12.8 oz (119.2 kg)   BMI 30.76 kg/m       Objective:   Physical Exam  Constitutional: He is oriented to person, place, and time. He appears well-developed and well-nourished. No distress.  Cardiovascular: Normal rate, regular rhythm, normal heart sounds and intact distal pulses.  Exam reveals no gallop and no friction rub.   No murmur heard. Pulmonary/Chest: Effort normal and breath sounds normal. No respiratory distress. He has no wheezes. He has no rales. He exhibits no tenderness.  Neurological: He is alert and oriented to person, place, and time.  Skin: Skin is warm and dry. No rash noted. He is not diaphoretic. No erythema. No pallor.  Psychiatric: He has a normal mood and affect. His behavior is normal. Judgment and thought content normal.  Nursing note and vitals reviewed.     Assessment & Plan:  1. Diabetic ketoacidosis without coma associated with type 2 diabetes mellitus ([facility])  - POC HgB A1c - 9.6. He has come down roughly 2.5 points in the last month  - I will keep him on insulin therapy until he is seen by Endocrinology and at that time he will hopefulyl be able to start transitioning to oral agents - Of course, continue to eat healthy and exercise  2. Essential hypertension - Continue to monitor.   Dorothyann Peng, NP

## 2016-08-02 ENCOUNTER — Telehealth: Payer: Self-pay | Admitting: Adult Health

## 2016-08-02 NOTE — Telephone Encounter (Signed)
Pt need new Rx for Ultra one touch test strips and lancets  Pharm:  [facility] [employer]

## 2016-08-06 ENCOUNTER — Encounter: Payer: BLUE CROSS/BLUE SHIELD | Admitting: *Deleted

## 2016-08-06 ENCOUNTER — Other Ambulatory Visit: Payer: Self-pay

## 2016-08-06 DIAGNOSIS — Z713 Dietary counseling and surveillance: Secondary | ICD-10-CM | POA: Diagnosis not present

## 2016-08-06 DIAGNOSIS — E1110 Type 2 diabetes mellitus with ketoacidosis without coma: Secondary | ICD-10-CM

## 2016-08-06 MED ORDER — GLUCOSE BLOOD VI STRP: ORAL_STRIP | 12 refills | Status: DC

## 2016-08-06 MED ORDER — ONETOUCH ULTRASOFT LANCETS MISC: 12 refills | Status: DC

## 2016-08-06 NOTE — Progress Notes (Signed)
Patient was seen on 08/06/2016 for the third of a series of three diabetes self-management courses at the [facility].   Keith Leblanc the amount of activity recommended for healthy living . Describe activities suitable for individual needs . Identify ways to regularly incorporate activity into daily life . Identify barriers to activity and ways to over come these barriers  Identify diabetes medications being personally used and their primary action for lowering glucose and possible side effects . Describe role of stress on blood glucose and develop strategies to address psychosocial issues . Identify diabetes complications and ways to prevent them  Explain how to manage diabetes during illness . Evaluate success in meeting personal goal . Establish 2-3 goals that they will plan to diligently work on until they return for the  79-month follow-up visit  Goals:   I will be active 30 minutes or more 3 times a week for minimum of 150 minutes / week  To help manage stress I will do yoga at least 3 times a week prior to going to bed  Your patient has identified these potential barriers to change:  Stress  Your patient has identified their diabetes self-care support plan as  Family Support Plan:  Attend Support Group as desired

## 2016-08-06 NOTE — Telephone Encounter (Signed)
Rx has been sent in. 

## 2016-08-07 ENCOUNTER — Other Ambulatory Visit: Payer: Self-pay | Admitting: Adult Health

## 2016-08-11 ENCOUNTER — Other Ambulatory Visit: Payer: Self-pay | Admitting: Adult Health

## 2016-08-11 DIAGNOSIS — I10 Essential (primary) hypertension: Secondary | ICD-10-CM

## 2016-08-12 ENCOUNTER — Other Ambulatory Visit: Payer: Self-pay | Admitting: Adult Health

## 2016-08-27 ENCOUNTER — Ambulatory Visit (INDEPENDENT_AMBULATORY_CARE_PROVIDER_SITE_OTHER): Payer: 59 | Admitting: Internal Medicine

## 2016-08-27 ENCOUNTER — Encounter: Payer: Self-pay | Admitting: Internal Medicine

## 2016-08-27 DIAGNOSIS — E1311 Other specified diabetes mellitus with ketoacidosis with coma: Secondary | ICD-10-CM

## 2016-08-27 DIAGNOSIS — E1111 Type 2 diabetes mellitus with ketoacidosis with coma: Secondary | ICD-10-CM

## 2016-08-27 DIAGNOSIS — E1165 Type 2 diabetes mellitus with hyperglycemia: Secondary | ICD-10-CM | POA: Diagnosis not present

## 2016-08-27 DIAGNOSIS — Z794 Long term (current) use of insulin: Secondary | ICD-10-CM

## 2016-08-27 LAB — LIPID PANEL
CHOL/HDL RATIO: 4
CHOLESTEROL: 226 mg/dL — AB (ref 0–200)
HDL: 61.6 mg/dL (ref >39.00)
LDL CALC: 143 mg/dL — AB (ref 0–99)
NonHDL: 164.52
TRIGLYCERIDES: 110 mg/dL (ref 0.0–149.0)
VLDL: 22 mg/dL (ref 0.0–40.0)

## 2016-08-27 LAB — GLUCOSE, RANDOM: Glucose, Bld: 118 mg/dL — ABNORMAL HIGH (ref 70–99)

## 2016-08-27 MED ORDER — METFORMIN HCL 500 MG PO TABS: 1000.0000 mg | ORAL_TABLET | Freq: Two times a day (BID) | ORAL | 3 refills | Status: DC

## 2016-08-27 MED ORDER — BASAGLAR KWIKPEN 100 UNIT/ML ~~LOC~~ SOPN: 30.0000 [IU] | PEN_INJECTOR | Freq: Every day | SUBCUTANEOUS | 2 refills | Status: DC

## 2016-08-27 MED ORDER — INSULIN ASPART 100 UNIT/ML FLEXPEN: 4.0000 [IU] | PEN_INJECTOR | Freq: Three times a day (TID) | SUBCUTANEOUS | 6 refills | Status: DC

## 2016-08-27 NOTE — Patient Instructions (Signed)
Please start Metformin 500 mg with dinner x 4 days. If you tolerate this well, add another Metformin tablet (500 mg) with breakfast x 4 days. If you tolerate this well, add another metformin tablet with dinner (total 1000 mg) x 4 days. If you tolerate this well, add another metformin tablet with breakfast (total 1000 mg). Continue with 1000 mg of metformin 2x a day with breakfast and dinner.  Please decrease Basaglar to 30 units at night. If sugars in am <130, in 5 days, decrease to 25 units. If sugars in am <130, in 5 days, decrease to 20 units.  Please decrease Novolog to 4 units before each meal. If sugars later in the day are at goal >> stop Novolog.  Please return in 1.5 months with your sugar log.   Please let me know if the sugars are consistently <80 or >200.  PATIENT INSTRUCTIONS FOR TYPE 2 DIABETES:  **Please join MyChart!** - see attached instructions about how to join if you have not done so already.  DIET AND EXERCISE Diet and exercise is an important part of diabetic treatment.  We recommended aerobic exercise in the form of brisk walking (working between 40-60% of maximal aerobic capacity, similar to brisk walking) for 150 minutes per week (such as 30 minutes five days per week) along with 3 times per week performing 'resistance' training (using various gauge rubber tubes with handles) 5-10 exercises involving the major muscle groups (upper body, lower body and core) performing 10-15 repetitions (or near fatigue) each exercise. Start at half the above goal but build slowly to reach the above goals. If limited by weight, joint pain, or disability, we recommend daily walking in a swimming pool with water up to waist to reduce pressure from joints while allow for adequate exercise.    BLOOD GLUCOSES Monitoring your blood glucoses is important for continued management of your diabetes. Please check your blood glucoses 2-4 times a day: fasting, before meals and at bedtime (you can  rotate these measurements - e.g. one day check before the 3 meals, the next day check before 2 of the meals and before bedtime, etc.).   HYPOGLYCEMIA (low blood sugar) Hypoglycemia is usually a reaction to not eating, exercising, or taking too much insulin/ other diabetes drugs.  Symptoms include tremors, sweating, hunger, confusion, headache, etc. Treat IMMEDIATELY with 15 grams of Carbs: . 4 glucose tablets .  cup regular juice/soda . 2 tablespoons raisins . 4 teaspoons sugar . 1 tablespoon honey Recheck blood glucose in 15 mins and repeat above if still symptomatic/blood glucose <100.  RECOMMENDATIONS TO REDUCE YOUR RISK OF DIABETIC COMPLICATIONS: * Take your prescribed MEDICATION(S) * Follow a DIABETIC diet: Complex carbs, fiber rich foods, (monounsaturated and polyunsaturated) fats * AVOID saturated/trans fats, high fat foods, >2,300 mg salt per day. * EXERCISE at least 5 times a week for 30 minutes or preferably daily.  * DO NOT SMOKE OR DRINK more than 1 drink a day. * Check your FEET every day. Do not wear tightfitting shoes. Contact us if you develop an ulcer * See your EYE doctor once a year or more if needed * Get a FLU shot once a year * Get a PNEUMONIA vaccine once before and once after age 48 years  GOALS:  * Your Hemoglobin A1c of <7%  * fasting sugars need to be <130 * after meals sugars need to be <180 (2h after you start eating) * Your Systolic BP should be [id number] or lower  * Your  Diastolic BP should be 80 or lower  * Your HDL (Good Cholesterol) should be 40 or higher  * Your LDL (Bad Cholesterol) should be 100 or lower. * Your Triglycerides should be 150 or lower  * Your Urine microalbumin (kidney function) should be <30 * Your Body Mass Index should be 25 or lower    Please consider the following ways to cut down carbs and fat and increase fiber and micronutrients in your diet: - substitute whole grain for white bread or pasta - substitute brown rice for white  rice - substitute 90-calorie flat bread pieces for slices of bread when possible - substitute sweet potatoes or yams for white potatoes - substitute humus for margarine - substitute tofu for cheese when possible - substitute almond or rice milk for regular milk (would not drink soy milk daily due to concern for soy estrogen influence on breast cancer risk) - substitute dark chocolate for other sweets when possible - substitute water - can add lemon or orange slices for taste - for diet sodas (artificial sweeteners will trick your body that you can eat sweets without getting calories and will lead you to overeating and weight gain in the long run) - do not skip breakfast or other meals (this will slow down the metabolism and will result in more weight gain over time)  - can try smoothies made from fruit and almond/rice milk in am instead of regular breakfast - can also try old-fashioned (not instant) oatmeal made with almond/rice milk in am - order the dressing on the side when eating salad at a restaurant (pour less than half of the dressing on the salad) - eat as little meat as possible - can try juicing, but should not forget that juicing will get rid of the fiber, so would alternate with eating raw veg./fruits or drinking smoothies - use as little oil as possible, even when using olive oil - can dress a salad with a mix of balsamic vinegar and lemon juice, for e.g. - use agave nectar, stevia sugar, or regular sugar rather than artificial sweateners - steam or broil/roast veggies  - snack on veggies/fruit/nuts (unsalted, preferably) when possible, rather than processed foods - reduce or eliminate aspartame in diet (it is in diet sodas, chewing gum, etc) Read the labels!  Try to read Dr. Janene Harvey book: "Program for Reversing Diabetes" for other ideas for healthy eating.

## 2016-08-27 NOTE — Progress Notes (Signed)
Patient ID: Keith Leblanc, male   DOB: 04/28/1966, 51 y.o.   MRN: [id number]   HPI: Mohamadou Vidrine is a 51 y.o.-year-old male, referred by his PCP, Cory Nafziger, NP, for management of DM2, dx in 06/2016 (prev. Prediabetes since 2015-16), insulin-dependent, uncontrolled, without complications.  Patient was recently diagnosed with diabetes in 07/07/2016, when he presented to the hospital in DKA. An A1c was 12%. His CBG was 1081. Retrospectively, he had increased urination, increased thirst, blurred vision, fatigue, rapid weight loss of 25 lbs. He had a lot of stress at work + drinking A LOT of sweet drinks (4 cans of soda a day + gatorade + juice). He is now off these but still working on decreasing Alcohol - now 4-5 beers a day.  He was started on insulin inhouse, and his CBG is greatly improved afterwards. Less than a month later, his HbA1c decreased to 9.6%.  Last hemoglobin A1c was: Lab Results  Component Value Date   HGBA1C 9.6 08/01/2016   HGBA1C 12.0 (H) 07/07/2016   Pt is on a regimen of: - Basaglar 40 units at bedtime - Novolog 6 units 3x a day, 5 min before meals  Pt checks his sugars 1-3x a day and they are: - am: 76, 80-147 - 2h after b'fast: 107-122 - before lunch: 97-134 - 2h after lunch: n/c - before dinner: 94, 126 - 2h after dinner: n/c - bedtime: n/c - nighttime: n/c No lows. Lowest sugar was 76; ? hypoglycemia awareness. Highest sugar was 226 (alcohol).  Glucometer: One Touch Ultra  Pt's meals are: - Breakfast: 2-3 boiled eggs, sausage, cheese, diet tea - Lunch: salad, chicken or pork, coke zero - Dinner: sea food + veggies + brown rice, coke zero - Snacks: water  - no CKD, last BUN/creatinine:  Lab Results  Component Value Date   BUN 15 07/16/2016   BUN 22 (H) 07/09/2016   CREATININE 1.11 07/16/2016   CREATININE 1.18 07/09/2016   - last set of lipids: No results found for: CHOL, HDL, LDLCALC, LDLDIRECT, TRIG, CHOLHDL - last eye exam was in 06/2016.  No DR.  - no numbness and tingling in his feet.  Pt has FH of DM in mother.  ROS: Constitutional: + see HPI >> now resolved, + poor sleep Eyes: + blurry vision, no xerophthalmia ENT: no sore throat, no nodules palpated in throat, no dysphagia/odynophagia, no hoarseness Cardiovascular: no CP/SOB/palpitations/leg swelling Respiratory: no cough/SOB Gastrointestinal: no N/V/+ D/no C/+ acid reflux Musculoskeletal: no muscle/joint aches Skin: + rash Neurological: no tremors/numbness/tingling/dizziness Psychiatric: + depression/anxiety + low libido, + diff with erections  Past Medical History:  Diagnosis Date  . Blood in stool    bright red blood   . Chicken pox   . Diabetes mellitus (HCC)   . Erectile dysfunction   . Gout   . Hypertension    Past Surgical History:  Procedure Laterality Date  . APPENDECTOMY  2004   Social History   Social History  . Marital status: Single    Spouse name: N/A  . Number of children: 1 - 25 y/o in 2018   Occupational History  . Customer service manager    Social History Main Topics  . Smoking status: Former Smoker  . Smokeless tobacco: Started to chew tobacco   . Alcohol use      Comment: 4-5 beers a day   . Drug use: Yes    Types: Marijuana  . Sexual activity: Yes   Social History Narrative   Works for   [employer]    Not married    One daughter who does not live with him    Likes to gamble, walk in the parks, travel.    Current Outpatient Prescriptions on File Prior to Visit  Medication Sig Dispense Refill  . amLODipine (NORVASC) 10 MG tablet Take 1 tablet (10 mg total) by mouth daily. 30 tablet 3  . blood glucose meter kit and supplies Dispense based on patient and insurance preference. Use up to four times daily as directed. (FOR ICD-9 250.00, 250.01). 1 each 0  . doxazosin (CARDURA) 4 MG tablet TAKE 1 TABLET BY MOUTH DAILY 90 tablet 1  . insulin aspart (NOVOLOG FLEXPEN) 100 UNIT/ML FlexPen Inject 6 Units into the skin 3 (three) times  daily with meals. 15 mL 6  . Insulin Glargine (LANTUS SOLOSTAR) 100 UNIT/ML Solostar Pen Inject 40 Units into the skin daily at 10 pm. 15 mL 6  . Lancets (ONETOUCH ULTRASOFT) lancets Test blood sugars as directed. Dx E11.9 100 each 12  . nebivolol (BYSTOLIC) 10 MG tablet Take 1 tablet (10 mg total) by mouth daily. 30 tablet 3  . ONE TOUCH ULTRA TEST test strip USE UP TO 4 TIMES DAILY AS DIRECTED 100 each 0  . pantoprazole (PROTONIX) 40 MG tablet Take 1 tablet (40 mg total) by mouth daily at 6 (six) AM. 30 tablet 3  . Cod Liver Oil CAPS Take 1 capsule by mouth daily.    . colchicine 0.6 MG tablet Take 0.6 mg by mouth 2 (two) times daily. Reported on 06/09/2015  0  . indomethacin (INDOCIN) 50 MG capsule TAKE ONE CAPSULE BY MOUTH 3 TIMES A DAY (Patient not taking: Reported on 08/27/2016) 30 capsule 5  . Insulin Pen Needle (PEN NEEDLES 3/16") 31G X 5 MM MISC 6-40 Units by Does not apply route 4 (four) times daily - after meals and at bedtime. (Patient not taking: Reported on 08/27/2016) 100 each 6  . sildenafil (REVATIO) 20 MG tablet TAKE 2-5 TABLETS BY MOUTH AS NEEDED FOR SEXUAL ACTIVITY (Patient not taking: Reported on 08/27/2016) 50 tablet 3  . VIAGRA 100 MG tablet ONE TABLET BY MOUTH AS DIRECTED DAILY AS NEEDED  3   No current facility-administered medications on file prior to visit.    No Known Allergies Family History  Problem Relation Age of Onset  . Alcohol abuse Father   . Hypertension Father   . Heart disease Father   . Breast cancer Mother   . Hypertension Mother     PE: BP 130/88 (BP Location: Left Arm, Patient Position: Sitting)   Pulse 60   Ht 6' 5" (1.956 m)   Wt 266 lb (120.7 kg)   SpO2 98%   BMI 31.54 kg/m  Wt Readings from Last 3 Encounters:  08/27/16 266 lb (120.7 kg)  08/01/16 262 lb 12.8 oz (119.2 kg)  07/23/16 265 lb (120.2 kg)   Constitutional: overweight, in NAD Eyes: PERRLA, EOMI, no exophthalmos ENT: moist mucous membranes, no thyromegaly, no cervical  lymphadenopathy Cardiovascular: RRR, No MRG Respiratory: CTA B Gastrointestinal: abdomen soft, NT, ND, BS+ Musculoskeletal: no deformities, strength intact in all 4 Skin: moist, warm, no rashes Neurological: no tremor with outstretched hands, DTR normal in all 4  ASSESSMENT: 1. DM2, new dx, -insulin-dependent, uncontrolled, without complications  PLAN:  1. Patient with new dx of diabetes, on basal-bolus insulin regimen, with much improved control since diagnosis. We can now start tapering the insulin down and add oral meds. Will start by decreasing Basaglar  and Novolog and I advised him to stop Novolog completely is sugars are at goal after decreasing to 4 units per meal. - we also discussed at length about diet and suggested changes. He already cut out his regular soft drinks, but advised to taper his diet drinks and the alcohol to off. Also, he needs to improve b'fast which mostly consists of fat >> made suggestions for a healthier b'fast - we will check his insulin production today and add a lipid panel since he is fasting; Orders Placed This Encounter  Procedures  . Anti-islet cell antibody  . Glutamic acid decarboxylase auto abs  . Glucose, Random  . C-peptide  . Lipid Panel  - I suggested to:  Patient Instructions  Please start Metformin 500 mg with dinner x 4 days. If you tolerate this well, add another Metformin tablet (500 mg) with breakfast x 4 days. If you tolerate this well, add another metformin tablet with dinner (total 1000 mg) x 4 days. If you tolerate this well, add another metformin tablet with breakfast (total 1000 mg). Continue with 1000 mg of metformin 2x a day with breakfast and dinner.  Please decrease Basaglar to 30 units at night. If sugars in am <130, in 5 days, decrease to 25 units. If sugars in am <130, in 5 days, decrease to 20 units.  Please decrease Novolog to 4 units before each meal. If sugars later in the day are at goal >> stop Novolog.  Please  return in 1.5 months with your sugar log.   Please let me know if the sugars are consistently <80 or >200.  - continue checking sugars at different times of the day - check 2 times a day, rotating checks - given sugar log and advised how to fill it and to bring it at next appt  - given foot care handout and explained the principles  - given instructions for hypoglycemia management "15-15 rule"  - advised for yearly eye exams >> he is UTD - refuses flu shot today - Return to clinic in 1.5 mo with sugar log   Component     Latest Ref Rng & Units 08/27/2016          Cholesterol     0 - 200 mg/dL 226 (H)  Triglycerides     0.0 - 149.0 mg/dL 110.0  HDL Cholesterol     >39.00 mg/dL 61.60  VLDL     0.0 - 40.0 mg/dL 22.0  LDL (calc)     0 - 99 mg/dL 143 (H)  Total CHOL/HDL Ratio      4  NonHDL      164.52  Glucose     70 - 99 mg/dL 118 (H)  Pancreatic Islet Cell Antibody     <5 JDF Units <5  Glutamic Acid Decarb Ab     <5 IU/mL <5  C-Peptide     0.80 - 3.85 ng/mL 1.45  Labs confirmed type II, rather than type 1 diabetes.  LDL is high. He will need a statin if LDL does not improve at next check.   , MD PhD [facility]   

## 2016-08-28 LAB — C-PEPTIDE: C-Peptide: 1.45 ng/mL (ref 0.80–3.85)

## 2016-08-30 ENCOUNTER — Other Ambulatory Visit: Payer: Self-pay | Admitting: Adult Health

## 2016-08-30 NOTE — Telephone Encounter (Signed)
Ok to refill for one year  

## 2016-08-31 LAB — GLUTAMIC ACID DECARBOXYLASE AUTO ABS

## 2016-09-04 ENCOUNTER — Telehealth: Payer: Self-pay

## 2016-09-04 LAB — ANTI-ISLET CELL ANTIBODY

## 2016-09-04 NOTE — Telephone Encounter (Signed)
LVM, gave lab results. Gave call back number if any questions or concerns.  

## 2016-09-04 NOTE — Telephone Encounter (Signed)
-----   Message from Philemon Kingdom, MD sent at 09/04/2016  1:11 PM EDT ----- Almyra Free, can you please call pt: Labs confirmed type II, rather than type I diabetes, which is great!   LDL is high. He will need a statin if LDL does not improve at next check.

## 2016-09-23 ENCOUNTER — Other Ambulatory Visit: Payer: Self-pay | Admitting: Adult Health

## 2016-09-26 ENCOUNTER — Other Ambulatory Visit: Payer: Self-pay | Admitting: Adult Health

## 2016-09-26 NOTE — Telephone Encounter (Signed)
Ok to refill for one year  

## 2016-09-26 NOTE — Telephone Encounter (Signed)
Pt was last seen on 08-01-16 and would like to know why his nebivolol was denied

## 2016-09-30 ENCOUNTER — Other Ambulatory Visit: Payer: Self-pay | Admitting: Adult Health

## 2016-09-30 MED ORDER — NEBIVOLOL HCL 10 MG PO TABS: 10.0000 mg | ORAL_TABLET | Freq: Every day | ORAL | 3 refills | Status: DC

## 2016-09-30 MED ORDER — AMLODIPINE BESYLATE 10 MG PO TABS: 10.0000 mg | ORAL_TABLET | Freq: Every day | ORAL | 3 refills | Status: DC

## 2016-09-30 NOTE — Telephone Encounter (Signed)
Pt needs amlodipine 10 mg #90 w.refills send [facility]

## 2016-09-30 NOTE — Telephone Encounter (Signed)
Medication was refilled.

## 2016-10-04 ENCOUNTER — Other Ambulatory Visit: Payer: Self-pay | Admitting: Internal Medicine

## 2016-10-25 ENCOUNTER — Ambulatory Visit: Payer: 59 | Admitting: Internal Medicine

## 2016-11-11 ENCOUNTER — Ambulatory Visit: Payer: BLUE CROSS/BLUE SHIELD | Admitting: Internal Medicine

## 2016-12-30 ENCOUNTER — Other Ambulatory Visit: Payer: Self-pay | Admitting: Adult Health

## 2016-12-31 ENCOUNTER — Other Ambulatory Visit: Payer: Self-pay | Admitting: Family Medicine

## 2016-12-31 MED ORDER — AMLODIPINE BESYLATE 10 MG PO TABS: 10.0000 mg | ORAL_TABLET | Freq: Every day | ORAL | 0 refills | Status: DC

## 2016-12-31 NOTE — Telephone Encounter (Signed)
Received a fax from the pharmacy stating insurance will only cover a 90 day supply.  Sent in for 90 days and 0 refills.

## 2017-01-13 ENCOUNTER — Ambulatory Visit: Payer: BLUE CROSS/BLUE SHIELD | Admitting: Internal Medicine

## 2017-01-21 ENCOUNTER — Ambulatory Visit (INDEPENDENT_AMBULATORY_CARE_PROVIDER_SITE_OTHER): Payer: 59 | Admitting: Internal Medicine

## 2017-01-21 ENCOUNTER — Encounter: Payer: Self-pay | Admitting: Internal Medicine

## 2017-01-21 VITALS — BP 124/88 | HR 57 | Ht 77.0 in | Wt 272.0 lb

## 2017-01-21 DIAGNOSIS — Z794 Long term (current) use of insulin: Secondary | ICD-10-CM | POA: Diagnosis not present

## 2017-01-21 DIAGNOSIS — E661 Drug-induced obesity: Secondary | ICD-10-CM

## 2017-01-21 DIAGNOSIS — Z6832 Body mass index (BMI) 32.0-32.9, adult: Secondary | ICD-10-CM | POA: Insufficient documentation

## 2017-01-21 DIAGNOSIS — E1165 Type 2 diabetes mellitus with hyperglycemia: Secondary | ICD-10-CM

## 2017-01-21 LAB — POCT GLYCOSYLATED HEMOGLOBIN (HGB A1C): HEMOGLOBIN A1C: 5.6

## 2017-01-21 MED ORDER — BASAGLAR KWIKPEN 100 UNIT/ML ~~LOC~~ SOPN: PEN_INJECTOR | SUBCUTANEOUS | 1 refills | Status: DC

## 2017-01-21 MED ORDER — DULAGLUTIDE 0.75 MG/0.5ML ~~LOC~~ SOAJ: SUBCUTANEOUS | 1 refills | Status: DC

## 2017-01-21 NOTE — Patient Instructions (Addendum)
Please start Trulicity 0.10 mg weekly. Let me know when you are close to running out to call in the higher dose to your pharmacy (1.5 mg).  Please decrease Basaglar to 30 units at bedtime. If sugars are at goal: reduce the dose to 20 units.  Stop Novolog after you start Trulicity.  Stay off Metformin for now.  Please return in 1.5 months with your sugar log.

## 2017-01-21 NOTE — Progress Notes (Signed)
Patient ID: Keith Leblanc, male   DOB: [birth year], 51 y.o.   MRN: [id number]   HPI: Keith Leblanc is a 51 y.o.-year-old male, returning for f/u for  DM2, dx in 06/2016 (prev. Prediabetes since 2015-16), insulin-dependent, uncontrolled, without long term complications. Last visit 5 mo ago. He did not return in 6 weeks as advised.  Since last visit, he has a new job at [employer]. Less time to exercise.  Reviewed hx: Patient was recently diagnosed with diabetes in 07/07/2016, when he presented to the hospital in DKA. An A1c was 12%. His CBG was 1081. Retrospectively, he had increased urination, increased thirst, blurred vision, fatigue, rapid weight loss of 25 lbs. He had a lot of stress at work + drinking A LOT of sweet drinks (4 cans of soda a day + gatorade + juice). He is now off these but still working on decreasing Alcohol - now 4-5 beers a day.  He was started on insulin inhouse, and his CBG is greatly improved afterwards. Less than a month later, his HbA1c decreased to 9.6%.  Last hemoglobin A1c was: Lab Results  Component Value Date   HGBA1C 9.6 08/01/2016   HGBA1C 12.0 (H) 07/07/2016   Component     Latest Ref Rng & Units 08/27/2016  Glucose     70 - 99 mg/dL 118 (H)  Pancreatic Islet Cell Antibody     <5 JDF Units <5  Glutamic Acid Decarb Ab     <5 IU/mL <5  C-Peptide     0.80 - 3.85 ng/mL 1.45  Labs confirmed type II, rather than type 1 diabetes.   Pt was on a regimen of: - Basaglar 40 units at bedtime - Novolog 6 units 3x a day, 5 min before meals  At last visit, we started to decrease insulins and added Metformin: - Metformin 500 mg 0-1x a day with dinner >> nausea with higher doses - Basaglar 40 units at bedtime - takes 2-3x a week - Novolog 6 units 3x a day, before meals  Pt checks his sugars 0-1x a day: - am: 76, 80-147 >> "around 138", 1x 198 - 2h after b'fast: 107-122 >> n/c - before lunch: 97-134 >> ? - 2h after lunch: n/c - before dinner: 94, 126 >> ? - 2h  after dinner: n/c - bedtime: n/c - nighttime: n/c No lows. Lowest sugar was 76 >> 101; ? hypoglycemia awareness. Highest sugar was 226 (alcohol) >> 200.  Glucometer: One Touch Ultra  Pt's meals are: - Breakfast: 2-3 boiled eggs, sausage, cheese, diet tea - Lunch: salad, chicken or pork, coke zero - Dinner: sea food + veggies + brown rice, coke zero - Snacks: water  - No CKD, last BUN/creatinine:  Lab Results  Component Value Date   BUN 15 07/16/2016   BUN 22 (H) 07/09/2016   CREATININE 1.11 07/16/2016   CREATININE 1.18 07/09/2016   - last set of lipids: Lab Results  Component Value Date   CHOL 226 (H) 08/27/2016   HDL 61.60 08/27/2016   LDLCALC 143 (H) 08/27/2016   TRIG 110.0 08/27/2016   CHOLHDL 4 08/27/2016   - last eye exam was in 06/2016 >> No DR - denies numbness and tingling in his feet.  ROS: Constitutional: + weight gain, no fatigue, no subjective hyperthermia, no subjective hypothermia Eyes: no blurry vision, no xerophthalmia ENT: no sore throat, no nodules palpated in throat, no dysphagia, no odynophagia, no hoarseness Cardiovascular: no CP/no SOB/no palpitations/no leg swelling Respiratory: no cough/no SOB/no wheezing Gastrointestinal:  no N/no V/no D/no C/no acid reflux Musculoskeletal: no muscle aches/no joint aches Skin: no rashes, no hair loss Neurological: no tremors/no numbness/no tingling/no dizziness  I reviewed pt's medications, allergies, PMH, social hx, family hx, and changes were documented in the history of present illness. Otherwise, unchanged from my initial visit note.   Past Medical History:  Diagnosis Date  . Blood in stool    bright red blood   . Chicken pox   . Diabetes mellitus ([place])   . Erectile dysfunction   . Gout   . Hypertension    Past Surgical History:  Procedure Laterality Date  . APPENDECTOMY  2004   Social History   Social History  . Marital status: Single    Spouse name: N/A  . Number of children: 1 - 39 y/o  in 2018   Occupational History  . Customer Therapist, nutritional    Social History Main Topics  . Smoking status: Former Research scientist (life sciences)  . Smokeless tobacco: Started to chew tobacco   . Alcohol use      Comment: 4-5 beers a day   . Drug use: Yes    Types: Marijuana  . Sexual activity: Yes   Social History Narrative   Works for [employer]    Not married    One daughter who does not live with him    Likes to gamble, walk in the parks, travel.    Current Outpatient Prescriptions on File Prior to Visit  Medication Sig Dispense Refill  . amLODipine (NORVASC) 10 MG tablet Take 1 tablet (10 mg total) by mouth daily. 90 tablet 0  . blood glucose meter kit and supplies Dispense based on patient and insurance preference. Use up to four times daily as directed. (FOR ICD-9 250.00, 250.01). 1 each 0  . BYSTOLIC 5 MG tablet TAKE 1 TABLET EVERY DAY 90 tablet 3  . Cod Liver Oil CAPS Take 1 capsule by mouth daily.    . colchicine 0.6 MG tablet Take 0.6 mg by mouth 2 (two) times daily. Reported on 06/09/2015  0  . doxazosin (CARDURA) 4 MG tablet TAKE 1 TABLET BY MOUTH DAILY 90 tablet 1  . insulin aspart (NOVOLOG FLEXPEN) 100 UNIT/ML FlexPen Inject 4 Units into the skin 3 (three) times daily with meals. 15 mL 6  . Insulin Glargine (BASAGLAR KWIKPEN) 100 UNIT/ML SOPN INJECT 40 UNITS DAILY AT 10 PM 15 pen 0  . Lancets (ONETOUCH ULTRASOFT) lancets Test blood sugars as directed. Dx E11.9 100 each 12  . metFORMIN (GLUCOPHAGE) 500 MG tablet Take 2 tablets (1,000 mg total) by mouth 2 (two) times daily with a meal. 360 tablet 3  . nebivolol (BYSTOLIC) 10 MG tablet Take 1 tablet (10 mg total) by mouth daily. 30 tablet 3  . ONE TOUCH ULTRA TEST test strip USE UP TO 4 TIMES DAILY AS DIRECTED 100 each 3  . pantoprazole (PROTONIX) 40 MG tablet Take 1 tablet (40 mg total) by mouth daily at 6 (six) AM. 30 tablet 3  . sildenafil (REVATIO) 20 MG tablet TAKE 2-5 TABLETS BY MOUTH AS NEEDED FOR SEXUAL ACTIVITY 50 tablet 3  . VIAGRA 100 MG  tablet ONE TABLET BY MOUTH AS DIRECTED DAILY AS NEEDED  3  . indomethacin (INDOCIN) 50 MG capsule TAKE ONE CAPSULE BY MOUTH 3 TIMES A DAY (Patient not taking: Reported on 08/27/2016) 30 capsule 5  . Insulin Pen Needle (PEN NEEDLES 3/16") 31G X 5 MM MISC 6-40 Units by Does not apply route 4 (four) times  daily - after meals and at bedtime. (Patient not taking: Reported on 08/27/2016) 100 each 6   No current facility-administered medications on file prior to visit.    No Known Allergies Family History  Problem Relation Age of Onset  . Alcohol abuse Father   . Hypertension Father   . Heart disease Father   . Breast cancer Mother   . Hypertension Mother   Pt has FH of DM in mother.  PE: BP 124/88 (BP Location: Left Arm, Patient Position: Sitting)   Pulse (!) 57   Ht '6\' 5"'$  (1.956 m)   Wt 272 lb (123.4 kg)   SpO2 97%   BMI 32.25 kg/m  Wt Readings from Last 3 Encounters:  01/21/17 272 lb (123.4 kg)  08/27/16 266 lb (120.7 kg)  08/01/16 262 lb 12.8 oz (119.2 kg)   Constitutional: overweight, in NAD Eyes: PERRLA, EOMI, no exophthalmos ENT: moist mucous membranes, no thyromegaly, no cervical lymphadenopathy Cardiovascular: RRR, No MRG Respiratory: CTA B Gastrointestinal: abdomen soft, NT, ND, BS+ Musculoskeletal: no deformities, strength intact in all 4 Skin: moist, warm, no rashes Neurological: no tremor with outstretched hands, DTR normal in all 4  ASSESSMENT: 1. DM2, -insulin-dependent, uncontrolled, without long term complications, but with hyperglycemia  2. Obesity class 1 BMI Classification:  < 18.5 underweight   18.5-24.9 normal weight   25.0-29.9 overweight   30.0-34.9 class I obesity   35.0-39.9 class II obesity   ? 40.0 class III obesity   PLAN:  1. Patient with uncontrolled diabetes, still on basal-bolus insulin regimen as he could not tolerate Metformin and reduced exercise since last visit since he started a new job. He is not checking sugars frequently and  frequently forgets Engineer, agricultural. - I suggested to start trulicity and stop Novolog and Metformin and also decrease Basaglar. - sugars are occasionally high when he forgets Basaglar - no h/o pancreatitis - reviewed his labs from last visit >> type 1 rather than type 2 DM >> we can potentially stop insulin. - I suggested to:  Patient Instructions  Please start Trulicity 2.68 mg weekly. Let me know when you are close to running out to call in the higher dose to your pharmacy (1.5 mg).  Please decrease Basaglar to 30 units at bedtime. If sugars are at goal: reduce the dose to 20 units.  Stop Novolog after you start Trulicity.  Stay off Metformin for now.  Please return in 1.5 months with your sugar log.   - today, HbA1c is 5.6%  - continue checking sugars at different times of the day - check 2-3x a day, rotating checks - advised for yearly eye exams >> he is UTD - Return to clinic in 1.5 mo with sugar log   2. Obesity class 1 - worsening - will try to reduce insulin and start Trulicity >> both measures should help  Philemon Kingdom, MD PhD [facility]

## 2017-01-21 NOTE — Addendum Note (Signed)
Addended by: Caprice Beaver T on: 01/21/2017 11:00 AM   Modules accepted: Orders

## 2017-01-28 ENCOUNTER — Telehealth: Payer: Self-pay | Admitting: Internal Medicine

## 2017-01-28 NOTE — Telephone Encounter (Signed)
Keith Leblanc, he has BCBS - let's start 0.25 mg Ozempic weekly >> please send 1 pen with 5 refills. Stay on this dose for 1 mo, then contact me with sugars if our next appt is further away.

## 2017-01-28 NOTE — Telephone Encounter (Signed)
Patient was advised by the pharmacy that he needs a PA (verification with insurance) for his Dulaglutide (TRULICITY) 8.13 GI/7.1LL SOPN. Patient requesting a call once this has been done so he can follow up with the pharmacy.  [id number] - [place], [place] [address] [phone] (Phone) [phone] (Fax)

## 2017-01-28 NOTE — Telephone Encounter (Signed)
Okay to complete PA or change to something else?  Thank you!

## 2017-01-29 ENCOUNTER — Other Ambulatory Visit: Payer: Self-pay

## 2017-01-29 MED ORDER — SEMAGLUTIDE(0.25 OR 0.5MG/DOS) 2 MG/1.5ML ~~LOC~~ SOPN: 0.2500 mg | PEN_INJECTOR | SUBCUTANEOUS | 5 refills | Status: DC

## 2017-01-29 NOTE — Telephone Encounter (Signed)
Called and LVM advising patient of MD note of submitted medication. Left call back number if any questions.

## 2017-01-31 NOTE — Telephone Encounter (Signed)
[facility]em[facility]. Please call pharmacy and advise.

## 2017-02-05 ENCOUNTER — Other Ambulatory Visit: Payer: Self-pay

## 2017-02-05 MED ORDER — SEMAGLUTIDE(0.25 OR 0.5MG/DOS) 2 MG/1.5ML ~~LOC~~ SOPN: 0.2500 mg | PEN_INJECTOR | SUBCUTANEOUS | 5 refills | Status: DC

## 2017-02-05 NOTE — Telephone Encounter (Signed)
MEDICATION: Semaglutide (OZEMPIC) 0.25 or 0.5 MG/DOSE SOPN  PHARMACY: [id number] - [place], [place] - [address]    IS THIS A 90 DAY SUPPLY : the normal supply he has gotten in the past  IS PATIENT OUT OF MEDICTAION: yes  IF NOT; HOW MUCH IS LEFT: n/a  LAST APPOINTMENT DATE:01/21/17  NEXT APPOINTMENT DATE:03/11/17  OTHER COMMENTS: Medication was sent to the incorrect pharmacy initially, please correct as soon as possible. Patient has been out of medication for over a week.   **Let patient know to contact pharmacy at the end of the day to make sure medication is ready. **  ** Please notify patient to allow 48-72 hours to process**  **Encourage patient to contact the pharmacy for refills or they can request refills through [facility]**

## 2017-02-05 NOTE — Telephone Encounter (Signed)
Submitted to pharmacy given.

## 2017-02-11 ENCOUNTER — Other Ambulatory Visit: Payer: Self-pay

## 2017-02-11 MED ORDER — DULAGLUTIDE 0.75 MG/0.5ML ~~LOC~~ SOAJ: SUBCUTANEOUS | 1 refills | Status: DC

## 2017-02-17 ENCOUNTER — Other Ambulatory Visit: Payer: Self-pay

## 2017-02-17 ENCOUNTER — Telehealth: Payer: Self-pay | Admitting: Internal Medicine

## 2017-02-17 MED ORDER — INSULIN GLARGINE 100 UNIT/ML SOLOSTAR PEN: PEN_INJECTOR | SUBCUTANEOUS | 1 refills | Status: DC

## 2017-02-17 NOTE — Telephone Encounter (Signed)
OK 

## 2017-02-17 NOTE — Telephone Encounter (Signed)
New script to replace Basaglar b/c it's no longer covered by insurance.  Please call back for info.  Ty,  -LL

## 2017-02-17 NOTE — Telephone Encounter (Signed)
Submitted in Lantus, and called and LVM advising patient of changes.

## 2017-02-17 NOTE — Telephone Encounter (Signed)
Please advise on what to call in for patient as Keith Leblanc is no longer covered. Lantus okay? Thanks !

## 2017-02-17 NOTE — Telephone Encounter (Signed)
Called and LVM advising of the switching of medication. Left call back number if any questions.

## 2017-03-11 ENCOUNTER — Ambulatory Visit: Payer: BLUE CROSS/BLUE SHIELD | Admitting: Internal Medicine

## 2017-03-21 ENCOUNTER — Other Ambulatory Visit: Payer: Self-pay | Admitting: Adult Health

## 2017-03-21 NOTE — Telephone Encounter (Signed)
#  15 sent to the pharmacy by e-scribe per [facility].  No further until seen in OV.

## 2017-03-27 ENCOUNTER — Ambulatory Visit: Payer: BLUE CROSS/BLUE SHIELD | Admitting: Internal Medicine

## 2017-04-08 ENCOUNTER — Other Ambulatory Visit: Payer: Self-pay | Admitting: Adult Health

## 2017-04-09 NOTE — Telephone Encounter (Signed)
Left a message for a return call.  Pt needs to be set up for CPX and fasting lab work on or after 04/27/17.

## 2017-04-10 ENCOUNTER — Encounter: Payer: Self-pay | Admitting: Family Medicine

## 2017-04-10 NOTE — Telephone Encounter (Signed)
Tried to reach the pt.  Left a message for a return call.  Will send a letter.  Sent in 30 day supply.

## 2017-05-07 ENCOUNTER — Other Ambulatory Visit: Payer: Self-pay

## 2017-05-07 MED ORDER — INSULIN GLARGINE 100 UNIT/ML SOLOSTAR PEN: PEN_INJECTOR | SUBCUTANEOUS | 1 refills | Status: DC

## 2017-05-07 MED ORDER — DULAGLUTIDE 0.75 MG/0.5ML ~~LOC~~ SOAJ: SUBCUTANEOUS | 1 refills | Status: DC

## 2017-05-08 ENCOUNTER — Other Ambulatory Visit: Payer: Self-pay

## 2017-05-08 ENCOUNTER — Encounter: Payer: Self-pay | Admitting: Adult Health

## 2017-05-08 ENCOUNTER — Encounter: Payer: Self-pay | Admitting: Internal Medicine

## 2017-05-08 ENCOUNTER — Ambulatory Visit (INDEPENDENT_AMBULATORY_CARE_PROVIDER_SITE_OTHER): Payer: 59 | Admitting: Adult Health

## 2017-05-08 ENCOUNTER — Ambulatory Visit (INDEPENDENT_AMBULATORY_CARE_PROVIDER_SITE_OTHER): Payer: 59 | Admitting: Internal Medicine

## 2017-05-08 VITALS — BP 126/74 | HR 57 | Ht 77.0 in | Wt 271.6 lb

## 2017-05-08 VITALS — BP 135/82 | Temp 98.9°F | Ht 77.0 in | Wt 269.0 lb

## 2017-05-08 DIAGNOSIS — E661 Drug-induced obesity: Secondary | ICD-10-CM

## 2017-05-08 DIAGNOSIS — Z794 Long term (current) use of insulin: Secondary | ICD-10-CM

## 2017-05-08 DIAGNOSIS — Z6832 Body mass index (BMI) 32.0-32.9, adult: Secondary | ICD-10-CM

## 2017-05-08 DIAGNOSIS — E1165 Type 2 diabetes mellitus with hyperglycemia: Secondary | ICD-10-CM

## 2017-05-08 DIAGNOSIS — Z0000 Encounter for general adult medical examination without abnormal findings: Secondary | ICD-10-CM

## 2017-05-08 DIAGNOSIS — I10 Essential (primary) hypertension: Secondary | ICD-10-CM | POA: Diagnosis not present

## 2017-05-08 DIAGNOSIS — Z125 Encounter for screening for malignant neoplasm of prostate: Secondary | ICD-10-CM | POA: Diagnosis not present

## 2017-05-08 LAB — HEPATIC FUNCTION PANEL
ALBUMIN: 4.3 g/dL (ref 3.5–5.2)
ALK PHOS: 40 U/L (ref 39–117)
ALT: 18 U/L (ref 0–53)
AST: 15 U/L (ref 0–37)
Bilirubin, Direct: 0.1 mg/dL (ref 0.0–0.3)
TOTAL PROTEIN: 7.2 g/dL (ref 6.0–8.3)
Total Bilirubin: 0.4 mg/dL (ref 0.2–1.2)

## 2017-05-08 LAB — LIPID PANEL
Cholesterol: 200 mg/dL (ref 0–200)
HDL: 48.9 mg/dL (ref >39.00)
NonHDL: 151.02
TRIGLYCERIDES: 210 mg/dL — AB (ref 0.0–149.0)
Total CHOL/HDL Ratio: 4
VLDL: 42 mg/dL — ABNORMAL HIGH (ref 0.0–40.0)

## 2017-05-08 LAB — CBC WITH DIFFERENTIAL/PLATELET
BASOS PCT: 0.8 % (ref 0.0–3.0)
Basophils Absolute: 0 10*3/uL (ref 0.0–0.1)
Eosinophils Absolute: 0.1 10*3/uL (ref 0.0–0.7)
Eosinophils Relative: 1.6 % (ref 0.0–5.0)
HEMATOCRIT: 39.8 % (ref 39.0–52.0)
Hemoglobin: 13.2 g/dL (ref 13.0–17.0)
LYMPHS PCT: 46 % (ref 12.0–46.0)
Lymphs Abs: 2.5 10*3/uL (ref 0.7–4.0)
MCHC: 33.2 g/dL (ref 30.0–36.0)
MCV: 97.7 fl (ref 78.0–100.0)
MONOS PCT: 5.5 % (ref 3.0–12.0)
Monocytes Absolute: 0.3 10*3/uL (ref 0.1–1.0)
NEUTROS ABS: 2.5 10*3/uL (ref 1.4–7.7)
Neutrophils Relative %: 46.1 % (ref 43.0–77.0)
PLATELETS: 361 10*3/uL (ref 150.0–400.0)
RBC: 4.08 Mil/uL — ABNORMAL LOW (ref 4.22–5.81)
RDW: 12.7 % (ref 11.5–15.5)
WBC: 5.5 10*3/uL (ref 4.0–10.5)

## 2017-05-08 LAB — BASIC METABOLIC PANEL
BUN: 12 mg/dL (ref 6–23)
CHLORIDE: 102 meq/L (ref 96–112)
CO2: 30 meq/L (ref 19–32)
Calcium: 9.5 mg/dL (ref 8.4–10.5)
Creatinine, Ser: 1.17 mg/dL (ref 0.40–1.50)
GFR: 85.12 mL/min (ref >60.00)
Glucose, Bld: 149 mg/dL — ABNORMAL HIGH (ref 70–99)
Potassium: 4.2 mEq/L (ref 3.5–5.1)
SODIUM: 140 meq/L (ref 135–145)

## 2017-05-08 LAB — TSH: TSH: 1.07 u[IU]/mL (ref 0.35–4.50)

## 2017-05-08 LAB — PSA: PSA: 0.49 ng/mL (ref 0.10–4.00)

## 2017-05-08 LAB — LDL CHOLESTEROL, DIRECT: Direct LDL: 112 mg/dL

## 2017-05-08 MED ORDER — DULAGLUTIDE 0.75 MG/0.5ML ~~LOC~~ SOAJ: SUBCUTANEOUS | 1 refills | Status: DC

## 2017-05-08 NOTE — Progress Notes (Signed)
Subjective:    Patient ID: Keith Leblanc, male    DOB: Oct 20, 1965, 51 y.o.   MRN: [id number]  HPI Patient presents for yearly preventative medicine examination. He is a pleasant 51 year old male who  has a past medical history of Blood in stool, Chicken pox, Diabetes mellitus ([place]), Erectile dysfunction, Gout, and Hypertension.  Newly diagnosed diabetic in Jan 2018. Is currently being seen by endocrinology. His current regimen includes that of Trulicity and Lantus,  His last A1c is 5.6. He reports that he has had trouble filling Trulicity and has not had it in one week. His BS was 199 this morning. He is seeing Dr. Cruzita Lederer this morning   For history of hypertension he takes Norvasc and Bystolic. He has not been checking his blood pressure at home. Today in the office his BP is 135/82. He took his medications around 7 am this morning. I will see what his BP is at Endocrinology this afternoon   BP Readings from Last 3 Encounters:  05/08/17 135/82  01/21/17 124/88  08/27/16 130/88    All immunizations and health maintenance protocols were reviewed with the patient and needed orders were placed. He is UTD on vacciantions   Appropriate screening laboratory values were ordered for the patient including screening of hyperlipidemia, renal function and hepatic function.  Medication reconciliation,  past medical history, social history, problem list and allergies were reviewed in detail with the patient  Goals were established with regard to weight loss, exercise, and  diet in compliance with medications Wt Readings from Last 3 Encounters:  05/08/17 269 lb (122 kg)  01/21/17 272 lb (123.4 kg)  08/27/16 266 lb (120.7 kg)    Review of Systems  Constitutional: Negative.   HENT: Negative.   Eyes: Negative.   Respiratory: Negative.   Cardiovascular: Negative.   Gastrointestinal: Negative.   Endocrine: Negative.   Genitourinary: Negative.   Musculoskeletal: Negative.   Skin: Negative.     Allergic/Immunologic: Negative.   Neurological: Negative.   Hematological: Negative.   Psychiatric/Behavioral: Negative.   All other systems reviewed and are negative.  Past Medical History:  Diagnosis Date  . Blood in stool    bright red blood   . Chicken pox   . Diabetes mellitus ([place])   . Erectile dysfunction   . Gout   . Hypertension     Social History   Socioeconomic History  . Marital status: Single    Spouse name: Not on file  . Number of children: Not on file  . Years of education: Not on file  . Highest education level: Not on file  Social Needs  . Financial resource strain: Not on file  . Food insecurity - worry: Not on file  . Food insecurity - inability: Not on file  . Transportation needs - medical: Not on file  . Transportation needs - non-medical: Not on file  Occupational History  . Not on file  Tobacco Use  . Smoking status: Former Research scientist (life sciences)  . Smokeless tobacco: Current User    Types: Chew  Substance and Sexual Activity  . Alcohol use: Yes    Alcohol/week: 2.4 - 3.0 oz    Types: 4 - 5 Cans of beer per week  . Drug use: Yes    Types: Marijuana  . Sexual activity: Yes  Other Topics Concern  . Not on file  Social History Narrative   Works for [employer]    Not married    One daughter who does not  live with him    Likes to gamble, walk in the parks, travel.     Past Surgical History:  Procedure Laterality Date  . APPENDECTOMY  2004    Family History  Problem Relation Age of Onset  . Alcohol abuse Father   . Hypertension Father   . Heart disease Father   . Breast cancer Mother   . Hypertension Mother     No Known Allergies  Current Outpatient Medications on File Prior to Visit  Medication Sig Dispense Refill  . amLODipine (NORVASC) 10 MG tablet TAKE 1 TABLET BY MOUTH EVERY DAY 30 tablet 0  . blood glucose meter kit and supplies Dispense based on patient and insurance preference. Use up to four times daily as directed. (FOR ICD-9 250.00,  250.01). 1 each 0  . Cod Liver Oil CAPS Take 1 capsule by mouth daily.    . colchicine 0.6 MG tablet Take 0.6 mg by mouth 2 (two) times daily. Reported on 06/09/2015  0  . doxazosin (CARDURA) 4 MG tablet TAKE 1 TABLET BY MOUTH DAILY 90 tablet 1  . indomethacin (INDOCIN) 50 MG capsule Take 1 capsule by mouth 3 times a day.  Now due for an appt with [facility]. 15 capsule 0  . Insulin Glargine (LANTUS SOLOSTAR) 100 UNIT/ML Solostar Pen Inject 30 units at 10 pm daily 10 pen 1  . Insulin Pen Needle (PEN NEEDLES 3/16") 31G X 5 MM MISC 6-40 Units by Does not apply route 4 (four) times daily - after meals and at bedtime. 100 each 6  . Lancets (ONETOUCH ULTRASOFT) lancets Test blood sugars as directed. Dx E11.9 100 each 12  . nebivolol (BYSTOLIC) 10 MG tablet Take 1 tablet (10 mg total) by mouth daily. 30 tablet 3  . ONE TOUCH ULTRA TEST test strip USE UP TO 4 TIMES DAILY AS DIRECTED 100 each 3  . sildenafil (REVATIO) 20 MG tablet TAKE 2-5 TABLETS BY MOUTH AS NEEDED FOR SEXUAL ACTIVITY 50 tablet 3  . Dulaglutide (TRULICITY) 7.12 WP/8.0DX SOPN Inject under skin 0.75 mg weekly (Patient not taking: Reported on 05/08/2017) 4 pen 1   No current facility-administered medications on file prior to visit.     BP 135/82 (BP Location: Left Arm)   Temp 98.9 F (37.2 C) (Oral)   Ht '6\' 5"'$  (1.956 m)   Wt 269 lb (122 kg)   BMI 31.90 kg/m       Objective:   Physical Exam  Constitutional: He is oriented to person, place, and time. He appears well-developed and well-nourished. No distress.  HENT:  Head: Normocephalic and atraumatic.  Right Ear: External ear normal.  Left Ear: External ear normal.  Nose: Nose normal.  Mouth/Throat: Oropharynx is clear and moist. No oropharyngeal exudate.  Eyes: Conjunctivae and EOM are normal. Pupils are equal, round, and reactive to light. Right eye exhibits no discharge. Left eye exhibits no discharge. No scleral icterus.  Neck: Normal range of motion. Neck supple. No JVD  present. No tracheal deviation present. No thyromegaly present.  Cardiovascular: Normal rate, regular rhythm, normal heart sounds and intact distal pulses. Exam reveals no gallop and no friction rub.  No murmur heard. Pulmonary/Chest: Effort normal and breath sounds normal. No stridor. No respiratory distress. He has no wheezes. He has no rales. He exhibits no tenderness.  Abdominal: Soft. Bowel sounds are normal. He exhibits no distension and no mass. There is no tenderness. There is no rebound and no guarding.  Genitourinary: Prostate normal. Rectal exam shows  external hemorrhoid. Rectal exam shows guaiac negative stool.  Musculoskeletal: Normal range of motion. He exhibits no edema, tenderness or deformity.  Lymphadenopathy:    He has no cervical adenopathy.  Neurological: He is alert and oriented to person, place, and time. He has normal reflexes. He displays normal reflexes. No cranial nerve deficit. He exhibits normal muscle tone. Coordination normal.  Skin: Skin is warm and dry. No rash noted. He is not diaphoretic. No erythema. No pallor.  Psychiatric: He has a normal mood and affect. His behavior is normal. Judgment and thought content normal.  Nursing note and vitals reviewed.     Assessment & Plan:  1. Routine general medical examination at a health care facility - Encouraged to exercise and eat healthy  - Follow up in one year or sooner if needed - Basic metabolic panel - CBC with Differential/Platelet - Hepatic function panel - Lipid panel - TSH - PSA  2. Essential hypertension - Consider adding agent  - Basic metabolic panel - CBC with Differential/Platelet - Hepatic function panel - Lipid panel - TSH - PSA  3. Type 2 diabetes mellitus with hyperglycemia, with long-term current use of insulin (HCC) - Sample of Trulicity and Lantus given to patient.  - Basic metabolic panel - CBC with Differential/Platelet - Hepatic function panel - Lipid panel - TSH -  PSA  Dorothyann Peng, NP

## 2017-05-08 NOTE — Patient Instructions (Addendum)
Please restart: - Trulicity 4.03 mg weekly  Please stop Lantus but let me know if sugars increase. If we need to restarte Lantus, we may need 15-20 units.  Please return in 3 months with your sugar log.

## 2017-05-08 NOTE — Progress Notes (Signed)
Patient ID: Keith Leblanc, male   DOB: 1965-08-10, 51 y.o.   MRN: [id number]   HPI: Keith Leblanc is a 51 y.o.-year-old male, returning for f/u for  DM2, dx in 06/2016 (prev. Prediabetes since 2015-16), insulin-dependent, uncontrolled, without long term complications. Last visit 3.5 mo ago.   Reviewed hx:: Patient was recently diagnosed with diabetes in 07/07/2016, when he presented to the hospital in DKA. An A1c was 12%. His CBG was 1081. Retrospectively, he had increased urination, increased thirst, blurred vision, fatigue, rapid weight loss of 25 lbs. He had a lot of stress at work + drinking A LOT of sweet drinks (4 cans of soda a day + gatorade + juice). He is now off these but still working on decreasing Alcohol - now 4-5 beers a day.  He was started on insulin inhouse, and his CBG is greatly improved afterwards. Less than a month later, his HbA1c decreased to 9.6%.  Last hemoglobin A1c was: Lab Results  Component Value Date   HGBA1C 5.6 01/21/2017   HGBA1C 9.6 08/01/2016   HGBA1C 12.0 (H) 07/07/2016   We confirmed type 2 rather than type 1 DM: Component     Latest Ref Rng & Units 08/27/2016  Glucose     70 - 99 mg/dL 118 (H)  Pancreatic Islet Cell Antibody     <5 JDF Units <5  Glutamic Acid Decarb Ab     <5 IU/mL <5  C-Peptide     0.80 - 3.85 ng/mL 1.45   Pt was on a regimen of: - Metformin 500 mg 0-1x a day with dinner >> could not tolerate this 2/2 nausea - Basaglar 40 units at bedtime - takes 2-3x a week - Novolog 6 units 3x a day, before meals >> stopped 00/9381  Now on: - Trulicity 8.29 mg weekly >> but out for last 3 weeks >> sugars higher - Lantus 30 units daily at bedtime >> but forgets it at night 3-4/7 days at least!   Pt checks his sugars 0-1x a day: - am: 76, 80-147 >> "around 138", 1x 198 >> 937-169, 678 off Trulicity - 2h after b'fast: 107-122 >> n/c - before lunch: 97-134 >> ? >> n/c - 2h after lunch: n/c - before dinner: 94, 126 >> ? >> n/c  - 2h  after dinner: n/c - bedtime: n/c - nighttime: n/c Lowest sugar was 76 >> 101 >> 109; ? hypoglycemia awareness. Highest sugar was 226 (alcohol) >> 200 >> 199.  Glucometer: One Touch Ultra  Pt's meals are: - Breakfast: 2-3 boiled eggs, sausage, cheese, diet tea - Lunch: salad, chicken or pork, coke zero - Dinner: sea food + veggies + brown rice, coke zero - Snacks: water  - No CKD, last BUN/creatinine:  Lab Results  Component Value Date   BUN 15 07/16/2016   BUN 22 (H) 07/09/2016   CREATININE 1.11 07/16/2016   CREATININE 1.18 07/09/2016   - + HL; last set of lipids: Lab Results  Component Value Date   CHOL 226 (H) 08/27/2016   HDL 61.60 08/27/2016   LDLCALC 143 (H) 08/27/2016   TRIG 110.0 08/27/2016   CHOLHDL 4 08/27/2016   - last eye exam was in 06/2016 >> No DR - no numbness and tingling in his feet.  ROS: Constitutional: no weight gain/no weight loss, no fatigue, no subjective hyperthermia, no subjective hypothermia Eyes: no blurry vision, no xerophthalmia ENT: no sore throat, no nodules palpated in throat, no dysphagia, no odynophagia, no hoarseness Cardiovascular: no CP/no SOB/no  palpitations/no leg swelling Respiratory: no cough/no SOB/no wheezing Gastrointestinal: no N/no V/no D/no C/no acid reflux Musculoskeletal: no muscle aches/no joint aches Skin: no rashes, no hair loss Neurological: no tremors/no numbness/no tingling/no dizziness  I reviewed pt's medications, allergies, PMH, social hx, family hx, and changes were documented in the history of present illness. Otherwise, unchanged from my initial visit note.  Past Medical History:  Diagnosis Date  . Blood in stool    bright red blood   . Chicken pox   . Diabetes mellitus ([place])   . Erectile dysfunction   . Gout   . Hypertension    Past Surgical History:  Procedure Laterality Date  . APPENDECTOMY  2004   Social History   Social History  . Marital status: Single    Spouse name: N/A  . Number of  children: 1 - 9 y/o in 2018   Occupational History  . Customer Therapist, nutritional    Social History Main Topics  . Smoking status: Former Research scientist (life sciences)  . Smokeless tobacco: Started to chew tobacco   . Alcohol use      Comment: 4-5 beers a day   . Drug use: Yes    Types: Marijuana  . Sexual activity: Yes   Social History Narrative   Works for [employer]    Not married    One daughter who does not live with him    Likes to gamble, walk in the parks, travel.    Current Outpatient Medications on File Prior to Visit  Medication Sig Dispense Refill  . amLODipine (NORVASC) 10 MG tablet TAKE 1 TABLET BY MOUTH EVERY DAY 30 tablet 0  . blood glucose meter kit and supplies Dispense based on patient and insurance preference. Use up to four times daily as directed. (FOR ICD-9 250.00, 250.01). 1 each 0  . Cod Liver Oil CAPS Take 1 capsule by mouth daily.    . colchicine 0.6 MG tablet Take 0.6 mg by mouth 2 (two) times daily. Reported on 06/09/2015  0  . doxazosin (CARDURA) 4 MG tablet TAKE 1 TABLET BY MOUTH DAILY 90 tablet 1  . indomethacin (INDOCIN) 50 MG capsule Take 1 capsule by mouth 3 times a day.  Now due for an appt with [facility]. 15 capsule 0  . Insulin Glargine (LANTUS SOLOSTAR) 100 UNIT/ML Solostar Pen Inject 30 units at 10 pm daily 10 pen 1  . Insulin Pen Needle (PEN NEEDLES 3/16") 31G X 5 MM MISC 6-40 Units by Does not apply route 4 (four) times daily - after meals and at bedtime. 100 each 6  . Lancets (ONETOUCH ULTRASOFT) lancets Test blood sugars as directed. Dx E11.9 100 each 12  . nebivolol (BYSTOLIC) 10 MG tablet Take 1 tablet (10 mg total) by mouth daily. 30 tablet 3  . ONE TOUCH ULTRA TEST test strip USE UP TO 4 TIMES DAILY AS DIRECTED 100 each 3  . sildenafil (REVATIO) 20 MG tablet TAKE 2-5 TABLETS BY MOUTH AS NEEDED FOR SEXUAL ACTIVITY 50 tablet 3  . Dulaglutide (TRULICITY) 8.54 OE/7.0JJ SOPN Inject under skin 0.75 mg weekly (Patient not taking: Reported on 05/08/2017) 4 pen 1   No current  facility-administered medications on file prior to visit.    No Known Allergies Family History  Problem Relation Age of Onset  . Alcohol abuse Father   . Hypertension Father   . Heart disease Father   . Breast cancer Mother   . Hypertension Mother   Pt has FH of DM in mother.  PE: BP 126/74 (BP Location: Right Arm, Patient Position: Sitting, Cuff Size: Normal)   Pulse (!) 57   Ht '6\' 5"'$  (1.956 m)   Wt 271 lb 9.6 oz (123.2 kg)   SpO2 97%   BMI 32.21 kg/m  Wt Readings from Last 3 Encounters:  05/08/17 271 lb 9.6 oz (123.2 kg)  05/08/17 269 lb (122 kg)  01/21/17 272 lb (123.4 kg)   Constitutional: overweight, in NAD Eyes: PERRLA, EOMI, no exophthalmos ENT: moist mucous membranes, no thyromegaly, no cervical lymphadenopathy Cardiovascular: RRR, No MRG Respiratory: CTA B Gastrointestinal: abdomen soft, NT, ND, BS+ Musculoskeletal: no deformities, strength intact in all 4 Skin: moist, warm, no rashes Neurological: no tremor with outstretched hands, DTR normal in all 4  ASSESSMENT: 1. DM2, -insulin-dependent, uncontrolled, without long term complications, but with hyperglycemia  2. Obesity class 1 BMI Classification:  < 18.5 underweight   18.5-24.9 normal weight   25.0-29.9 overweight   30.0-34.9 class I obesity   35.0-39.9 class II obesity   ? 40.0 class III obesity   PLAN:  1. Patient with uncontrolled diabetes, currently on basal insulin (of which he forgets many doses) and GLP-1 receptor agonist, low-dose.  His sugars are higher after he ran out of Trulicity 3 weeks ago.  We will send a new prescription today.  He tells me that he frequently forgets his Lantus, and his sugars are still at or close to goal, therefore, will try to stop the insulin completely for now and I advised him to let me know if they start increasing.  In that situation, we may need to restart a lower dose of Lantus or, preferably, to increase Trulicity dose - Of note, we cannot use metformin  due to nausea - I suggested to:  Patient Instructions  Please restart: - Trulicity 1.90 mg weekly  Please stop Lantus but let me know if sugars increase. If we need to restarte Lantus, we may need 15-20 units.  Please return in 3 months with your sugar log.   - today, HbA1c is 5.9% (stable) - continue checking sugars at different times of the day - check 1x a day, rotating checks - advised for yearly eye exams >> he is UTD - Refuses a flu shot today - Return to clinic in 3 mo with sugar log   2. Obesity class 1 - stable - continue Trulicity which should reduce his appetite and help with wt loss - Also, taking him off the insulin will greatly help with weight loss  Philemon Kingdom, MD PhD [facility] Endocrinology

## 2017-05-09 ENCOUNTER — Other Ambulatory Visit: Payer: Self-pay

## 2017-05-09 ENCOUNTER — Other Ambulatory Visit: Payer: Self-pay | Admitting: Adult Health

## 2017-05-09 ENCOUNTER — Other Ambulatory Visit: Payer: Self-pay | Admitting: Family Medicine

## 2017-05-09 MED ORDER — ATORVASTATIN CALCIUM 20 MG PO TABS: 20.0000 mg | ORAL_TABLET | Freq: Every day | ORAL | 3 refills | Status: DC

## 2017-05-09 MED ORDER — DULAGLUTIDE 0.75 MG/0.5ML ~~LOC~~ SOAJ: SUBCUTANEOUS | 1 refills | Status: DC

## 2017-05-09 NOTE — Telephone Encounter (Signed)
Sent to the pharmacy by e-scribe. 

## 2017-05-20 ENCOUNTER — Telehealth: Payer: Self-pay | Admitting: Internal Medicine

## 2017-05-20 MED ORDER — DULAGLUTIDE 0.75 MG/0.5ML ~~LOC~~ SOAJ: SUBCUTANEOUS | 1 refills | Status: DC

## 2017-05-20 NOTE — Telephone Encounter (Signed)
Patient requested a refill of medication Dulaglutide (TRULICITY) 4.33 IR/5.1OA SOPN [[id number]]   He haven't heard anything in a couple of week, please advise  Send to  Pharmacy:  [facility] [place] to [facility]

## 2017-05-20 NOTE — Telephone Encounter (Signed)
Med sent, PA was approved until 02/10/18

## 2017-05-29 ENCOUNTER — Telehealth: Payer: Self-pay | Admitting: Family Medicine

## 2017-05-29 ENCOUNTER — Other Ambulatory Visit: Payer: Self-pay | Admitting: Adult Health

## 2017-05-29 NOTE — Telephone Encounter (Signed)
Copied from [place] [id number]. Topic: General - Other >> May 29, 2017 12:24 PM Carolyn Stare wrote:    Pt call to say he saw [facility] for physical and is asking why he need an appt to get the below refill    indomethacin (INDOCIN) 50 MG capsule  [facility]

## 2017-05-30 NOTE — Telephone Encounter (Signed)
Left a message for a return call.  CRM created and permission for nursing staff to speak to the pt.

## 2017-05-30 NOTE — Telephone Encounter (Signed)
See telephone note form 05/29/17

## 2017-05-30 NOTE — Telephone Encounter (Signed)
Is he having an acute gout flare? This is not a medication he should be taking on a daily basis long term

## 2017-05-30 NOTE — Telephone Encounter (Signed)
How long do you want to fill?

## 2017-06-05 NOTE — Telephone Encounter (Signed)
Left a message for a return call.

## 2017-06-05 NOTE — Telephone Encounter (Signed)
Patient would like to have supply of indomethacin on hand to use for gout flares. He experiences a flare about 3-4x/year and the onset is rapid, so he would like to be able to keep the medicine to use PRN.

## 2017-06-06 ENCOUNTER — Other Ambulatory Visit: Payer: Self-pay | Admitting: Adult Health

## 2017-06-06 MED ORDER — INDOMETHACIN 50 MG PO CAPS: ORAL_CAPSULE | ORAL | 0 refills | Status: DC

## 2017-06-06 NOTE — Telephone Encounter (Signed)
Medication sent to pharmacy  

## 2017-08-08 ENCOUNTER — Other Ambulatory Visit: Payer: Self-pay | Admitting: Family Medicine

## 2017-08-08 MED ORDER — NEBIVOLOL HCL 10 MG PO TABS: 10.0000 mg | ORAL_TABLET | Freq: Every day | ORAL | 2 refills | Status: DC

## 2017-08-08 NOTE — Telephone Encounter (Signed)
Sent to the pharmacy by e-scribe. 

## 2017-08-18 ENCOUNTER — Telehealth: Payer: Self-pay | Admitting: Internal Medicine

## 2017-08-18 MED ORDER — INSULIN GLARGINE 100 UNIT/ML SOLOSTAR PEN: PEN_INJECTOR | SUBCUTANEOUS | 1 refills | Status: DC

## 2017-08-18 MED ORDER — DULAGLUTIDE 0.75 MG/0.5ML ~~LOC~~ SOAJ: SUBCUTANEOUS | 1 refills | Status: DC

## 2017-08-18 NOTE — Telephone Encounter (Signed)
Sent!

## 2017-08-18 NOTE — Telephone Encounter (Signed)
Dulaglutide (TRULICITY) 4.66 ZL/9.3TT SOPN   Insulin Glargine (LANTUS SOLOSTAR) 100 UNIT/ML Solostar Pen   [facility] needs approval for this medication so it can be dispense.  Please send response electronically to [place] Ref [id number]

## 2017-10-31 ENCOUNTER — Other Ambulatory Visit: Payer: Self-pay | Admitting: Family Medicine

## 2017-10-31 MED ORDER — NEBIVOLOL HCL 10 MG PO TABS: 10.0000 mg | ORAL_TABLET | Freq: Every day | ORAL | 1 refills | Status: DC

## 2017-10-31 NOTE — Telephone Encounter (Signed)
Last sent to local pharmacy.

## 2017-12-06 ENCOUNTER — Other Ambulatory Visit: Payer: Self-pay | Admitting: Internal Medicine

## 2017-12-10 ENCOUNTER — Other Ambulatory Visit: Payer: Self-pay | Admitting: Internal Medicine

## 2017-12-11 ENCOUNTER — Other Ambulatory Visit: Payer: Self-pay | Admitting: Adult Health

## 2017-12-12 ENCOUNTER — Other Ambulatory Visit: Payer: Self-pay

## 2017-12-12 MED ORDER — "PEN NEEDLES 3/16"" 31G X 5 MM MISC": 6.0000 [IU] | Freq: Three times a day (TID) | 6 refills | Status: DC

## 2017-12-12 NOTE — Telephone Encounter (Signed)
DENIED.  REQUEST SHOULD GO TO DR. GHERGHE AT ENDO.

## 2017-12-15 ENCOUNTER — Other Ambulatory Visit: Payer: Self-pay

## 2017-12-15 MED ORDER — "PEN NEEDLES 3/16"" 31G X 5 MM MISC": 6.0000 [IU] | Freq: Three times a day (TID) | 6 refills | Status: DC

## 2018-03-03 ENCOUNTER — Encounter: Payer: Self-pay | Admitting: Adult Health

## 2018-03-03 ENCOUNTER — Ambulatory Visit (INDEPENDENT_AMBULATORY_CARE_PROVIDER_SITE_OTHER): Payer: Self-pay | Admitting: Adult Health

## 2018-03-03 VITALS — BP 138/80 | Temp 98.8°F | Wt 270.0 lb

## 2018-03-03 DIAGNOSIS — K922 Gastrointestinal hemorrhage, unspecified: Secondary | ICD-10-CM

## 2018-03-03 MED ORDER — OMEPRAZOLE 20 MG PO CPDR: 20.0000 mg | DELAYED_RELEASE_CAPSULE | Freq: Every day | ORAL | 3 refills | Status: DC

## 2018-03-03 NOTE — Progress Notes (Signed)
Subjective:    Patient ID: Keith Leblanc, male    DOB: [birth year], 52 y.o.   MRN: [id number]  HPI 51 year old male who  has a past medical history of Blood in stool, Chicken pox, Diabetes mellitus ([place]), Erectile dysfunction, Gout, and Hypertension. He presents to the office today for the concern of blood in stool. This has been an intermittent issue over the past few years. He reports that he feels as though he is noticing blood in his stool more often. Sometimes he has bright red blood and sometimes it is dark tarry stools. He denies any itching or burning in his rectum.   Associated symptoms include nausea ( this happens 3-4 times a week),which corresponds to the times he has blood in his stool. He denies any vomiting but does have burning sensation in his stomach and heart burn. Takes Zantac as needed.   He also reports that he drinks about a pint of liquor a day and has been doing this for sometime.   Review of Systems See HPI   Past Medical History:  Diagnosis Date  . Blood in stool    bright red blood   . Chicken pox   . Diabetes mellitus ([place])   . Erectile dysfunction   . Gout   . Hypertension     Social History   Socioeconomic History  . Marital status: Single    Spouse name: Not on file  . Number of children: Not on file  . Years of education: Not on file  . Highest education level: Not on file  Occupational History  . Not on file  Social Needs  . Financial resource strain: Not on file  . Food insecurity:    Worry: Not on file    Inability: Not on file  . Transportation needs:    Medical: Not on file    Non-medical: Not on file  Tobacco Use  . Smoking status: Former Research scientist (life sciences)  . Smokeless tobacco: Current User    Types: Chew  Substance and Sexual Activity  . Alcohol use: Yes    Alcohol/week: 4.0 - 5.0 standard drinks    Types: 4 - 5 Cans of beer per week  . Drug use: Yes    Types: Marijuana  . Sexual activity: Yes  Lifestyle  . Physical activity:   Days per week: Not on file    Minutes per session: Not on file  . Stress: Not on file  Relationships  . Social connections:    Talks on phone: Not on file    Gets together: Not on file    Attends religious service: Not on file    Active member of club or organization: Not on file    Attends meetings of clubs or organizations: Not on file    Relationship status: Not on file  . Intimate partner violence:    Fear of current or ex partner: Not on file    Emotionally abused: Not on file    Physically abused: Not on file    Forced sexual activity: Not on file  Other Topics Concern  . Not on file  Social History Narrative   Works for [employer]    Not married    One daughter who does not live with him    Likes to gamble, walk in the parks, travel.     Past Surgical History:  Procedure Laterality Date  . APPENDECTOMY  2004    Family History  Problem Relation Age of Onset  .  Alcohol abuse Father   . Hypertension Father   . Heart disease Father   . Breast cancer Mother   . Hypertension Mother     No Known Allergies  Current Outpatient Medications on File Prior to Visit  Medication Sig Dispense Refill  . amLODipine (NORVASC) 10 MG tablet TAKE 1 TABLET BY MOUTH EVERY DAY 90 tablet 3  . atorvastatin (LIPITOR) 20 MG tablet Take 1 tablet (20 mg total) daily by mouth. 90 tablet 3  . blood glucose meter kit and supplies Dispense based on patient and insurance preference. Use up to four times daily as directed. (FOR ICD-9 250.00, 250.01). 1 each 0  . Cod Liver Oil CAPS Take 1 capsule by mouth daily.    . colchicine 0.6 MG tablet Take 0.6 mg by mouth 2 (two) times daily. Reported on 06/09/2015  0  . doxazosin (CARDURA) 4 MG tablet TAKE 1 TABLET BY MOUTH DAILY 90 tablet 1  . Dulaglutide (TRULICITY) 2.95 AO/1.3YQ SOPN Inject one pen weekly. NEED APPT FOR MORE REFILLS 6 mL 0  . indomethacin (INDOCIN) 50 MG capsule Take 1 capsule by mouth 3 times a day. 30 capsule 0  . Insulin Glargine (LANTUS  SOLOSTAR) 100 UNIT/ML Solostar Pen INJECT 30 UNITS  SUBCUTANEOUSLY AT 10 PM  DAILY 30 mL 0  . Insulin Pen Needle (PEN NEEDLES 3/16") 31G X 5 MM MISC 6-40 Units by Does not apply route 4 (four) times daily - after meals and at bedtime. 100 each 6  . Lancets (ONETOUCH ULTRASOFT) lancets Test blood sugars as directed. Dx E11.9 100 each 12  . nebivolol (BYSTOLIC) 10 MG tablet Take 1 tablet (10 mg total) by mouth daily. 90 tablet 1  . ONE TOUCH ULTRA TEST test strip USE UP TO 4 TIMES DAILY AS DIRECTED 100 each 3  . sildenafil (REVATIO) 20 MG tablet TAKE 2-5 TABLETS BY MOUTH AS NEEDED FOR SEXUAL ACTIVITY 50 tablet 3   No current facility-administered medications on file prior to visit.     BP 138/80   Temp 98.8 F (37.1 C) (Oral)   Wt 270 lb (122.5 kg)   BMI 32.02 kg/m       Objective:   Physical Exam  Constitutional: He is oriented to person, place, and time. He appears well-developed and well-nourished. No distress.  Cardiovascular: Normal rate, regular rhythm, normal heart sounds and intact distal pulses.  Pulmonary/Chest: Effort normal and breath sounds normal.  Abdominal: Soft. Bowel sounds are normal.  Genitourinary:  Genitourinary Comments: Refused   Neurological: He is alert and oriented to person, place, and time.  Skin: Skin is warm and dry. He is not diaphoretic.  Psychiatric: He has a normal mood and affect. His behavior is normal. Judgment and thought content normal.  Nursing note and vitals reviewed.     Assessment & Plan:  1. Gastrointestinal hemorrhage, unspecified gastrointestinal hemorrhage type - Likely due to alcohol use. Will start on prilosec daily and send to GI. He has turned 52 so he is due for colonoscopy.  - Ambulatory referral to Gastroenterology - omeprazole (PRILOSEC) 20 MG capsule; Take 1 capsule (20 mg total) by mouth daily.  Dispense: 30 capsule; Refill: 3 - Discussed cutting back on drinking. We made a schedule to slowly ween him from alcohol. He was  advised to follow up if he experiences any withdrawal symptoms. He will follow up in Nov for CPE   Dorothyann Peng, NP

## 2018-03-04 ENCOUNTER — Encounter: Payer: Self-pay | Admitting: Gastroenterology

## 2018-04-03 ENCOUNTER — Other Ambulatory Visit: Payer: Self-pay | Admitting: Adult Health

## 2018-04-03 ENCOUNTER — Encounter: Payer: Self-pay | Admitting: Adult Health

## 2018-04-03 ENCOUNTER — Ambulatory Visit (INDEPENDENT_AMBULATORY_CARE_PROVIDER_SITE_OTHER): Payer: Managed Care, Other (non HMO) | Admitting: Adult Health

## 2018-04-03 VITALS — BP 150/90 | HR 73 | Temp 99.1°F | Ht 76.0 in | Wt 277.0 lb

## 2018-04-03 DIAGNOSIS — E1165 Type 2 diabetes mellitus with hyperglycemia: Secondary | ICD-10-CM

## 2018-04-03 DIAGNOSIS — Z794 Long term (current) use of insulin: Secondary | ICD-10-CM

## 2018-04-03 DIAGNOSIS — Z23 Encounter for immunization: Secondary | ICD-10-CM

## 2018-04-03 DIAGNOSIS — E781 Pure hyperglyceridemia: Secondary | ICD-10-CM

## 2018-04-03 DIAGNOSIS — K921 Melena: Secondary | ICD-10-CM | POA: Diagnosis not present

## 2018-04-03 DIAGNOSIS — Z0000 Encounter for general adult medical examination without abnormal findings: Secondary | ICD-10-CM

## 2018-04-03 DIAGNOSIS — Z125 Encounter for screening for malignant neoplasm of prostate: Secondary | ICD-10-CM

## 2018-04-03 DIAGNOSIS — I10 Essential (primary) hypertension: Secondary | ICD-10-CM | POA: Diagnosis not present

## 2018-04-03 LAB — CBC WITH DIFFERENTIAL/PLATELET
BASOS ABS: 0 10*3/uL (ref 0.0–0.1)
Basophils Relative: 0.7 % (ref 0.0–3.0)
EOS ABS: 0.1 10*3/uL (ref 0.0–0.7)
Eosinophils Relative: 2.2 % (ref 0.0–5.0)
HEMATOCRIT: 37.8 % — AB (ref 39.0–52.0)
HEMOGLOBIN: 13.4 g/dL (ref 13.0–17.0)
LYMPHS PCT: 42.2 % (ref 12.0–46.0)
Lymphs Abs: 2.8 10*3/uL (ref 0.7–4.0)
MCHC: 35.4 g/dL (ref 30.0–36.0)
MCV: 93.4 fl (ref 78.0–100.0)
MONOS PCT: 7.6 % (ref 3.0–12.0)
Monocytes Absolute: 0.5 10*3/uL (ref 0.1–1.0)
Neutro Abs: 3.1 10*3/uL (ref 1.4–7.7)
Neutrophils Relative %: 47.3 % (ref 43.0–77.0)
Platelets: 389 10*3/uL (ref 150.0–400.0)
RBC: 4.04 Mil/uL — AB (ref 4.22–5.81)
RDW: 13.1 % (ref 11.5–15.5)
WBC: 6.5 10*3/uL (ref 4.0–10.5)

## 2018-04-03 LAB — HEPATIC FUNCTION PANEL
ALK PHOS: 52 U/L (ref 39–117)
ALT: 23 U/L (ref 0–53)
AST: 22 U/L (ref 0–37)
Albumin: 4.4 g/dL (ref 3.5–5.2)
BILIRUBIN DIRECT: 0 mg/dL (ref 0.0–0.3)
TOTAL PROTEIN: 7.5 g/dL (ref 6.0–8.3)
Total Bilirubin: 0.3 mg/dL (ref 0.2–1.2)

## 2018-04-03 LAB — BASIC METABOLIC PANEL
BUN: 17 mg/dL (ref 6–23)
CALCIUM: 9.7 mg/dL (ref 8.4–10.5)
CHLORIDE: 99 meq/L (ref 96–112)
CO2: 29 mEq/L (ref 19–32)
CREATININE: 1.34 mg/dL (ref 0.40–1.50)
GFR: 72.52 mL/min (ref >60.00)
Glucose, Bld: 210 mg/dL — ABNORMAL HIGH (ref 70–99)
Potassium: 4.2 mEq/L (ref 3.5–5.1)
SODIUM: 139 meq/L (ref 135–145)

## 2018-04-03 LAB — LIPID PANEL
Cholesterol: 240 mg/dL — ABNORMAL HIGH (ref 0–200)
HDL: 43 mg/dL (ref >39.00)
Total CHOL/HDL Ratio: 6
Triglycerides: 1227 mg/dL — ABNORMAL HIGH (ref 0.0–149.0)

## 2018-04-03 LAB — TSH: TSH: 1.42 u[IU]/mL (ref 0.35–4.50)

## 2018-04-03 LAB — LDL CHOLESTEROL, DIRECT: Direct LDL: 94 mg/dL

## 2018-04-03 LAB — PSA: PSA: 0.46 ng/mL (ref 0.10–4.00)

## 2018-04-03 NOTE — Patient Instructions (Signed)
It was great seeing you today   We will follow up with you regarding your lab work   Continue to work on diet, exercise, and cutting back on alcohol. I am proud of you on the steps you have made so far!   Please let me know if you need anything

## 2018-04-03 NOTE — Progress Notes (Signed)
Subjective:    Patient ID: Keith Leblanc, male    DOB: [birth year], 52 y.o.   MRN: [id number]  HPI Patient presents for yearly preventative medicine examination. He is a pleasant 52 year old male who  has a past medical history of Blood in stool, Chicken pox, Diabetes mellitus ([place]), Erectile dysfunction, Gout, and Hypertension.  DM -diagnosed in January 2018.  Is followed by endocrinology.Currently prescribed Trulicity 7.90 mg and Lantus 30 units nightly. Lab Results  Component Value Date   HGBA1C 5.6 01/21/2017   Essential Hypertension - Currently prescribed Norvasc 10 mg and Bystolic 10 mg. Took medication just prior to arrival  BP Readings from Last 3 Encounters:  04/03/18 (!) 150/90  03/03/18 138/80  05/08/17 126/74   Hyperlipidemia - Hs been prescribed lipitor but has not been taking it.  Lab Results  Component Value Date   CHOL 200 05/08/2017   HDL 48.90 05/08/2017   LDLCALC 143 (H) 08/27/2016   LDLDIRECT 112.0 05/08/2017   TRIG 210.0 (H) 05/08/2017   CHOLHDL 4 05/08/2017   GERD - controlled with Omeprazole   GI Bleeding - has an upcoming appointment with GI. Has not had any rectal bleeding since cutting back on drinking and starting Prilosec   Alcohol Abuse. Reports that he has cut back on drinking " about 25%" since the last time we saw each other. He is working on cutting back further.   All immunizations and health maintenance protocols were reviewed with the patient and needed orders were placed. He is due for flu vaccination and pneumovax 23. He refuses flu shot   Appropriate screening laboratory values were ordered for the patient including screening of hyperlipidemia, renal function and hepatic function. If indicated by BPH, a PSA was ordered.  Medication reconciliation,  past medical history, social history, problem list and allergies were reviewed in detail with the patient  Goals were established with regard to weight loss, exercise, and  diet in  compliance with medications  Wt Readings from Last 3 Encounters:  04/03/18 277 lb (125.6 kg)  03/03/18 270 lb (122.5 kg)  05/08/17 271 lb 9.6 oz (123.2 kg)     He has an upcoming appointment with GI   Has not had diabetic eye exam this year.   Review of Systems  Constitutional: Negative.   HENT: Negative.   Eyes: Negative.   Respiratory: Negative.   Cardiovascular: Negative.   Gastrointestinal: Negative.   Endocrine: Negative.   Genitourinary: Negative.   Musculoskeletal: Negative.   Skin: Negative.   Allergic/Immunologic: Negative.   Neurological: Negative.   Hematological: Negative.   Psychiatric/Behavioral: Negative.   All other systems reviewed and are negative.  Past Medical History:  Diagnosis Date  . Blood in stool    bright red blood   . Chicken pox   . Diabetes mellitus ([place])   . Erectile dysfunction   . Gout   . Hypertension     Social History   Socioeconomic History  . Marital status: Single    Spouse name: Not on file  . Number of children: Not on file  . Years of education: Not on file  . Highest education level: Not on file  Occupational History  . Not on file  Social Needs  . Financial resource strain: Not on file  . Food insecurity:    Worry: Not on file    Inability: Not on file  . Transportation needs:    Medical: Not on file    Non-medical: Not on file  Tobacco Use  . Smoking status: Former Research scientist (life sciences)  . Smokeless tobacco: Current User    Types: Chew  Substance and Sexual Activity  . Alcohol use: Yes    Alcohol/week: 4.0 - 5.0 standard drinks    Types: 4 - 5 Cans of beer per week  . Drug use: Yes    Types: Marijuana  . Sexual activity: Yes  Lifestyle  . Physical activity:    Days per week: Not on file    Minutes per session: Not on file  . Stress: Not on file  Relationships  . Social connections:    Talks on phone: Not on file    Gets together: Not on file    Attends religious service: Not on file    Active member of club  or organization: Not on file    Attends meetings of clubs or organizations: Not on file    Relationship status: Not on file  . Intimate partner violence:    Fear of current or ex partner: Not on file    Emotionally abused: Not on file    Physically abused: Not on file    Forced sexual activity: Not on file  Other Topics Concern  . Not on file  Social History Narrative   Works for [employer]    Not married    One daughter who does not live with him    Likes to gamble, walk in the parks, travel.     Past Surgical History:  Procedure Laterality Date  . APPENDECTOMY  2004    Family History  Problem Relation Age of Onset  . Alcohol abuse Father   . Hypertension Father   . Heart disease Father   . Breast cancer Mother   . Hypertension Mother     No Known Allergies  Current Outpatient Medications on File Prior to Visit  Medication Sig Dispense Refill  . amLODipine (NORVASC) 10 MG tablet TAKE 1 TABLET BY MOUTH EVERY DAY 90 tablet 3  . blood glucose meter kit and supplies Dispense based on patient and insurance preference. Use up to four times daily as directed. (FOR ICD-9 250.00, 250.01). 1 each 0  . colchicine 0.6 MG tablet Take 0.6 mg by mouth 2 (two) times daily. Reported on 06/09/2015  0  . Dulaglutide (TRULICITY) 3.97 QB/3.4LP SOPN Inject one pen weekly. NEED APPT FOR MORE REFILLS 6 mL 0  . indomethacin (INDOCIN) 50 MG capsule Take 1 capsule by mouth 3 times a day. 30 capsule 0  . Insulin Glargine (LANTUS SOLOSTAR) 100 UNIT/ML Solostar Pen INJECT 30 UNITS  SUBCUTANEOUSLY AT 10 PM  DAILY 30 mL 0  . Insulin Pen Needle (PEN NEEDLES 3/16") 31G X 5 MM MISC 6-40 Units by Does not apply route 4 (four) times daily - after meals and at bedtime. 100 each 6  . Lancets (ONETOUCH ULTRASOFT) lancets Test blood sugars as directed. Dx E11.9 100 each 12  . nebivolol (BYSTOLIC) 10 MG tablet Take 1 tablet (10 mg total) by mouth daily. 90 tablet 1  . omeprazole (PRILOSEC) 20 MG capsule Take 1 capsule  (20 mg total) by mouth daily. 30 capsule 3  . ONE TOUCH ULTRA TEST test strip USE UP TO 4 TIMES DAILY AS DIRECTED 100 each 3  . sildenafil (REVATIO) 20 MG tablet TAKE 2-5 TABLETS BY MOUTH AS NEEDED FOR SEXUAL ACTIVITY 50 tablet 3   No current facility-administered medications on file prior to visit.     BP (!) 150/90 (BP Location: Left Arm, Patient  Position: Sitting, Cuff Size: Large)   Pulse 73   Temp 99.1 F (37.3 C) (Oral)   Ht '6\' 4"'$  (1.93 m)   Wt 277 lb (125.6 kg)   SpO2 98%   BMI 33.72 kg/m       Objective:   Physical Exam  Constitutional: He is oriented to person, place, and time. He appears well-developed and well-nourished. No distress.  Overweight    HENT:  Head: Normocephalic and atraumatic.  Right Ear: External ear normal.  Left Ear: External ear normal.  Nose: Nose normal.  Mouth/Throat: Oropharynx is clear and moist. No oropharyngeal exudate.  Eyes: Pupils are equal, round, and reactive to light. Conjunctivae and EOM are normal. Right eye exhibits no discharge. Left eye exhibits no discharge. No scleral icterus.  Neck: Normal range of motion. Neck supple. No JVD present. No tracheal deviation present. No thyromegaly present.  Cardiovascular: Normal rate, regular rhythm, normal heart sounds and intact distal pulses. Exam reveals no gallop and no friction rub.  No murmur heard. Pulmonary/Chest: Effort normal and breath sounds normal. No stridor. No respiratory distress. He has no wheezes. He has no rales. He exhibits no tenderness.  Abdominal: Soft. Bowel sounds are normal. He exhibits no distension and no mass. There is no tenderness. There is no rebound and no guarding. No hernia.  Musculoskeletal: Normal range of motion. He exhibits no edema, tenderness or deformity.  Lymphadenopathy:    He has no cervical adenopathy.  Neurological: He is alert and oriented to person, place, and time. He displays normal reflexes. No cranial nerve deficit or sensory deficit. He  exhibits normal muscle tone. Coordination normal.  Skin: Skin is warm and dry. Capillary refill takes less than 2 seconds. No rash noted. He is not diaphoretic. No erythema. No pallor.  Psychiatric: He has a normal mood and affect. His behavior is normal. Judgment and thought content normal.  Nursing note and vitals reviewed.     Assessment & Plan:

## 2018-04-13 ENCOUNTER — Ambulatory Visit: Payer: Managed Care, Other (non HMO) | Admitting: Gastroenterology

## 2018-04-13 ENCOUNTER — Encounter: Payer: Self-pay | Admitting: Gastroenterology

## 2018-04-13 VITALS — BP 140/90 | HR 80 | Ht 76.0 in

## 2018-04-13 DIAGNOSIS — K219 Gastro-esophageal reflux disease without esophagitis: Secondary | ICD-10-CM

## 2018-04-13 DIAGNOSIS — K921 Melena: Secondary | ICD-10-CM

## 2018-04-13 NOTE — Patient Instructions (Signed)

## 2018-04-13 NOTE — Progress Notes (Signed)
Referring Provider: Dorothyann Peng, NP Primary Care Physician:  Dorothyann Peng, NP   Reason for Consultation:  Blood in the stool  IMPRESSION:  Bloody stools +/- melena GERD x decades with recent change in symptoms Frequent NSAIDs Regular alcohol use (pint of alcohol daily) No prior colon cancer screening  Colonoscopy recommended to evaluate for source of blood in the stool and bright red blood. Differential includes polyps, mass, outlet bleeding, and less likely colitis.   EGD recommended given his concerns for GI bleeding in the setting of NSAID use and possible PUD, as well as to evaluate for Barrett's  given his longstanding GERD with recent change in symptoms.   PLAN: EGD and Colonoscopy Abstinence from alcohol recommended  I consented the patient at the bedside today discussing the risks, benefits, and alternatives to endoscopic evaluation. In particular, we discussed the risks that include, but are not limited to, reaction to medication, cardiopulmonary compromise, bleeding requiring blood transfusion, aspiration resulting in pneumonia, perforation requiring surgery, and even death. We reviewed the risk of missed lesion including polyps or even cancer. The patient acknowledges these risks and asks that we proceed.   HPI: Keith Leblanc is a 52 y.o. male seen in consultation for blood in the stool. The history is obtained through the patient and review of his electronic medical record.   He has had intermittent rectal bleeding for years. Blood on the toilet paper and intermittent dark, tarry stools. Occassional associated nausea. No vomiting.    Daily heartburn for decades requiring high doses of Zantac. Started Prilosec last month and has had no further bleeding. He also attributes this improvement to his effort to drink less alcohol.  He is concerned that he has an ulcer.   Distant colonoscopy >10 years ago. He remembers having a polyp or two. Told to eat more fiber. Polyp  or two. Reassured him at that time. No specific follow-up recommended that he can remember.   Aleve for gout in his knees and elbows - 400-600 mg every other week. Denies the use of other NSAIDs.  No family history of GI disease. Drinks a pint of liquor daily.    Past Medical History:  Diagnosis Date  . Blood in stool    bright red blood   . Chicken pox   . Diabetes mellitus ([place])   . Erectile dysfunction   . Gout   . Hypertension     Past Surgical History:  Procedure Laterality Date  . APPENDECTOMY  2004    Prior to Admission medications   Medication Sig Start Date End Date Taking? Authorizing Provider  amLODipine (NORVASC) 10 MG tablet TAKE 1 TABLET BY MOUTH EVERY DAY 05/09/17  Yes Nafziger, Tommi Rumps, NP  blood glucose meter kit and supplies Dispense based on patient and insurance preference. Use up to four times daily as directed. (FOR ICD-9 250.00, 250.01). 07/09/16  Yes Eugenie Filler, MD  colchicine 0.6 MG tablet Take 0.6 mg by mouth 2 (two) times daily. Reported on 06/09/2015 01/25/15  Yes [provider]  Dulaglutide (TRULICITY) 6.94 WN/4.6EV SOPN Inject one pen weekly. NEED APPT FOR MORE REFILLS 12/08/17  Yes Philemon Kingdom, MD  indomethacin (INDOCIN) 50 MG capsule Take 1 capsule by mouth 3 times a day. 06/06/17  Yes Nafziger, Tommi Rumps, NP  Insulin Glargine (LANTUS SOLOSTAR) 100 UNIT/ML Solostar Pen INJECT 30 UNITS  SUBCUTANEOUSLY AT 10 PM  DAILY 12/10/17  Yes Philemon Kingdom, MD  Insulin Pen Needle (PEN NEEDLES 3/16") 31G X 5 MM MISC 6-40  Units by Does not apply route 4 (four) times daily - after meals and at bedtime. 12/15/17  Yes Philemon Kingdom, MD  Lancets [facility] ULTRASOFT) lancets Test blood sugars as directed. Dx E11.9 08/06/16  Yes Nafziger, Tommi Rumps, NP  nebivolol (BYSTOLIC) 10 MG tablet Take 1 tablet (10 mg total) by mouth daily. 10/31/17  Yes Nafziger, Tommi Rumps, NP  omeprazole (PRILOSEC) 20 MG capsule Take 1 capsule (20 mg total) by mouth daily. 03/03/18  Yes  Nafziger, Tommi Rumps, NP  ONE TOUCH ULTRA TEST test strip USE UP TO 4 TIMES DAILY AS DIRECTED 08/30/16  Yes Laurey Morale, MD  sildenafil (REVATIO) 20 MG tablet TAKE 2-5 TABLETS BY MOUTH AS NEEDED FOR SEXUAL ACTIVITY 07/30/16  Yes Dorothyann Peng, NP    Current Outpatient Medications  Medication Sig Dispense Refill  . amLODipine (NORVASC) 10 MG tablet TAKE 1 TABLET BY MOUTH EVERY DAY 90 tablet 3  . blood glucose meter kit and supplies Dispense based on patient and insurance preference. Use up to four times daily as directed. (FOR ICD-9 250.00, 250.01). 1 each 0  . colchicine 0.6 MG tablet Take 0.6 mg by mouth 2 (two) times daily. Reported on 06/09/2015  0  . Dulaglutide (TRULICITY) 4.33 IR/5.1OA SOPN Inject one pen weekly. NEED APPT FOR MORE REFILLS 6 mL 0  . indomethacin (INDOCIN) 50 MG capsule Take 1 capsule by mouth 3 times a day. 30 capsule 0  . Insulin Glargine (LANTUS SOLOSTAR) 100 UNIT/ML Solostar Pen INJECT 30 UNITS  SUBCUTANEOUSLY AT 10 PM  DAILY 30 mL 0  . Insulin Pen Needle (PEN NEEDLES 3/16") 31G X 5 MM MISC 6-40 Units by Does not apply route 4 (four) times daily - after meals and at bedtime. 100 each 6  . Lancets (ONETOUCH ULTRASOFT) lancets Test blood sugars as directed. Dx E11.9 100 each 12  . nebivolol (BYSTOLIC) 10 MG tablet Take 1 tablet (10 mg total) by mouth daily. 90 tablet 1  . omeprazole (PRILOSEC) 20 MG capsule Take 1 capsule (20 mg total) by mouth daily. 30 capsule 3  . ONE TOUCH ULTRA TEST test strip USE UP TO 4 TIMES DAILY AS DIRECTED 100 each 3  . sildenafil (REVATIO) 20 MG tablet TAKE 2-5 TABLETS BY MOUTH AS NEEDED FOR SEXUAL ACTIVITY 50 tablet 3   No current facility-administered medications for this visit.     Allergies as of 04/13/2018  . (No Known Allergies)    Family History  Problem Relation Age of Onset  . Alcohol abuse Father   . Hypertension Father   . Heart disease Father   . Breast cancer Mother   . Hypertension Mother     Social History    Socioeconomic History  . Marital status: Single    Spouse name: Not on file  . Number of children: Not on file  . Years of education: Not on file  . Highest education level: Not on file  Occupational History  . Not on file  Social Needs  . Financial resource strain: Not on file  . Food insecurity:    Worry: Not on file    Inability: Not on file  . Transportation needs:    Medical: Not on file    Non-medical: Not on file  Tobacco Use  . Smoking status: Former Research scientist (life sciences)  . Smokeless tobacco: Current User    Types: Chew  Substance and Sexual Activity  . Alcohol use: Yes    Alcohol/week: 4.0 - 5.0 standard drinks    Types: 4 -  5 Cans of beer per week  . Drug use: Yes    Types: Marijuana  . Sexual activity: Yes  Lifestyle  . Physical activity:    Days per week: Not on file    Minutes per session: Not on file  . Stress: Not on file  Relationships  . Social connections:    Talks on phone: Not on file    Gets together: Not on file    Attends religious service: Not on file    Active member of club or organization: Not on file    Attends meetings of clubs or organizations: Not on file    Relationship status: Not on file  . Intimate partner violence:    Fear of current or ex partner: Not on file    Emotionally abused: Not on file    Physically abused: Not on file    Forced sexual activity: Not on file  Other Topics Concern  . Not on file  Social History Narrative   Works for [employer]    Not married    One daughter who does not live with him    Likes to gamble, walk in the parks, travel.     Review of Systems: 12 system ROS is negative except as noted above.   Physical Exam: General:   Alert, well-nourished, pleasant and cooperative in NAD Head:  Normocephalic and atraumatic. Eyes:  Sclera clear, no icterus.   Conjunctiva pink. Mouth:  No deformity or lesions.   Neck:  Supple; no thyromegaly. Lungs:  Clear throughout to auscultation.   No wheezes.  Heart:  Regular  rate and rhythm; no murmurs Abdomen:  Soft, nontender, normal bowel sounds. No rebound or guarding. No hepatosplenomegaly Rectal:  Deferred  Msk:  Symmetrical without gross deformities. Extremities:  No gross deformities or edema. Neurologic:  Alert and  oriented x4;  grossly nonfocal Skin:  No rash or bruise. Psych:  Alert and cooperative. Normal mood and affect.   Jernard Reiber L. Tarri Glenn Md, MPH [facility] 04/13/2018, 3:00 PM

## 2018-04-15 ENCOUNTER — Encounter: Payer: Self-pay | Admitting: Family Medicine

## 2018-04-23 ENCOUNTER — Encounter: Payer: Self-pay | Admitting: Gastroenterology

## 2018-04-29 ENCOUNTER — Encounter: Payer: Self-pay | Admitting: Gastroenterology

## 2018-04-29 ENCOUNTER — Ambulatory Visit (AMBULATORY_SURGERY_CENTER): Payer: Managed Care, Other (non HMO) | Admitting: Gastroenterology

## 2018-04-29 VITALS — BP 161/83 | HR 52 | Temp 97.6°F | Resp 22 | Ht 76.0 in | Wt 277.0 lb

## 2018-04-29 DIAGNOSIS — K2970 Gastritis, unspecified, without bleeding: Secondary | ICD-10-CM

## 2018-04-29 DIAGNOSIS — K921 Melena: Secondary | ICD-10-CM

## 2018-04-29 DIAGNOSIS — D124 Benign neoplasm of descending colon: Secondary | ICD-10-CM | POA: Diagnosis not present

## 2018-04-29 DIAGNOSIS — D122 Benign neoplasm of ascending colon: Secondary | ICD-10-CM

## 2018-04-29 DIAGNOSIS — K219 Gastro-esophageal reflux disease without esophagitis: Secondary | ICD-10-CM | POA: Diagnosis not present

## 2018-04-29 MED ORDER — SODIUM CHLORIDE 0.9 % IV SOLN: 500.0000 mL | Freq: Once | INTRAVENOUS | Status: DC

## 2018-04-29 MED ORDER — OMEPRAZOLE 40 MG PO CPDR: 40.0000 mg | DELAYED_RELEASE_CAPSULE | Freq: Every day | ORAL | 1 refills | Status: DC

## 2018-04-29 NOTE — Progress Notes (Signed)
A/ox3 pleased with MAC, report to RN 

## 2018-04-29 NOTE — Op Note (Signed)
[facility] Patient Name: Keith Leblanc Procedure Date: 04/29/2018 3:24 PM MRN: [id number] Endoscopist: Thornton Park MD, MD Age: 52 Referring MD:  Date of Birth: 1965-11-25 Gender: Male Account #: [id number] Procedure:                Colonoscopy Indications:              Gastrointestinal bleeding. Recent hematochezia. No                            prior colon cancer screening. No family history of                            colon cancer or polyps. Frequent NSAIDs. No other                            GI symptoms. Medicines:                See the Anesthesia note for documentation of the                            administered medications Procedure:                Pre-Anesthesia Assessment:                           - Prior to the procedure, a History and Physical                            was performed, and patient medications and                            allergies were reviewed. The patient's tolerance of                            previous anesthesia was also reviewed. The risks                            and benefits of the procedure and the sedation                            options and risks were discussed with the patient.                            All questions were answered, and informed consent                            was obtained. Prior Anticoagulants: The patient has                            taken no previous anticoagulant or antiplatelet                            agents. ASA Grade Assessment: II - A patient with  mild systemic disease. After reviewing the risks                            and benefits, the patient was deemed in                            satisfactory condition to undergo the procedure.                           After obtaining informed consent, the colonoscope                            was passed under direct vision. Throughout the                            procedure, the patient's blood pressure, pulse,  and                            oxygen saturations were monitored continuously. The                            Model PCF-[id number]) scope was introduced                            through the anus and advanced to the the terminal                            ileum, with identification of the appendiceal                            orifice and IC valve. The colonoscopy was performed                            without difficulty. The patient tolerated the                            procedure well. The quality of the bowel                            preparation was good. Scope In: 3:40:16 PM Scope Out: 3:51:03 PM Scope Withdrawal Time: 0 hours 9 minutes 26 seconds  Total Procedure Duration: 0 hours 10 minutes 47 seconds  Findings:                 The perianal and digital rectal examinations were                            normal.                           A few small-mouthed diverticula were found in the                            sigmoid colon and descending colon.  A 6 mm polyp was found in the ascending colon. The                            polyp was sessile. The polyp was removed with a                            cold snare. Resection and retrieval were complete.                           A 4 mm polyp was found in the descending colon. The                            polyp was sessile. The polyp was removed with a                            cold snare. Resection and retrieval were complete.                           Non-bleeding internal hemorrhoids were found during                            retroflexion. The hemorrhoids were moderate.                           The exam was otherwise without abnormality on                            direct and retroflexion views. Complications:            No immediate complications. Estimated Blood Loss:     Estimated blood loss: none. Impression:               - Diverticulosis in the sigmoid colon and in the                             descending colon.                           - One 6 mm polyp in the ascending colon, removed                            with a cold snare. Resected and retrieved.                           - One 4 mm polyp in the descending colon, removed                            with a cold snare. Resected and retrieved.                           - Non-bleeding internal hemorrhoids.                           - The examination was otherwise normal on direct  and retroflexion views. Recommendation:           - Discharge patient to home.                           - Resume regular diet. Follow high-fiber diet.                           - Continue present medications.                           - Await pathology results.                           - Repeat colonoscopy in 5 years for surveillance                            based on pathology results if at least one polyp is                            adenomatous.                           - Avoid all NSAIDs                           - Conservative treatment for hemorrhoids with any                            additional bleeding. Thornton Park MD, MD 04/29/2018 4:04:43 PM This report has been signed electronically.

## 2018-04-29 NOTE — Op Note (Signed)
[facility] Patient Name: Keith Leblanc Procedure Date: 04/29/2018 3:24 PM MRN: [id number] Endoscopist: Thornton Park MD, MD Age: 52 Referring MD:  Date of Birth: [birth year] Gender: Male Account #: [id number] Procedure:                Upper GI endoscopy Indications:              Suspected gastro-esophageal reflux disease Medicines:                See the Anesthesia note for documentation of the                            administered medications Procedure:                Pre-Anesthesia Assessment:                           - Prior to the procedure, a History and Physical                            was performed, and patient medications and                            allergies were reviewed. The patient's tolerance of                            previous anesthesia was also reviewed. The risks                            and benefits of the procedure and the sedation                            options and risks were discussed with the patient.                            All questions were answered, and informed consent                            was obtained. Prior Anticoagulants: The patient has                            taken no previous anticoagulant or antiplatelet                            agents. ASA Grade Assessment: III - A patient with                            severe systemic disease. After reviewing the risks                            and benefits, the patient was deemed in                            satisfactory condition to undergo the procedure.  After obtaining informed consent, the endoscope was                            passed under direct vision. Throughout the                            procedure, the patient's blood pressure, pulse, and                            oxygen saturations were monitored continuously. The                            Endoscope was introduced through the mouth, and                            advanced  to the second part of duodenum. The upper                            GI endoscopy was accomplished without difficulty.                            The patient tolerated the procedure well. Scope In: Scope Out: Findings:                 A small hiatal hernia was present.                           LA Grade A (one or more mucosal breaks less than 5                            mm, not extending between tops of 2 mucosal folds)                            esophagitis with no bleeding was found. Biopsies                            were taken with a cold forceps for histology.                           A small hiatal hernia was present.                           Diffuse mild inflammation was found in the gastric                            body. Biopsies were taken with a cold forceps for                            histology.                           A single 3 mm nodule was found in the second  portion of the duodenum. Biopsies were taken with a                            cold forceps for histology. Complications:            No immediate complications. Estimated Blood Loss:     Estimated blood loss: none. Impression:               - Small hiatal hernia.                           - LA Grade A reflux esophagitis. Biopsied.                           - Small hiatal hernia.                           - Gastritis. Biopsied.                           - Normal examined duodenum. Recommendation:           - Await pathology results.                           - Resume regular diet.                           - Increase omeprazole to 40 mg daily for 8 weeks.                           - Avoid all NSAIDs Thornton Park MD, MD 04/29/2018 4:00:06 PM This report has been signed electronically.

## 2018-04-29 NOTE — Patient Instructions (Signed)
YOU HAD AN ENDOSCOPIC PROCEDURE TODAY AT [facility]:   Refer to the procedure report that was given to you for any specific questions about what was found during the examination.  If the procedure report does not answer your questions, please call your gastroenterologist to clarify.  If you requested that your care partner not be given the details of your procedure findings, then the procedure report has been included in a sealed envelope for you to review at your convenience later.  YOU SHOULD EXPECT: Some feelings of bloating in the abdomen. Passage of more gas than usual.  Walking can help get rid of the air that was put into your GI tract during the procedure and reduce the bloating. If you had a lower endoscopy (such as a colonoscopy or flexible sigmoidoscopy) you may notice spotting of blood in your stool or on the toilet paper. If you underwent a bowel prep for your procedure, you may not have a normal bowel movement for a few days.  Please Note:  You might notice some irritation and congestion in your nose or some drainage.  This is from the oxygen used during your procedure.  There is no need for concern and it should clear up in a day or so.  SYMPTOMS TO REPORT IMMEDIATELY:   Following lower endoscopy (colonoscopy or flexible sigmoidoscopy):  Excessive amounts of blood in the stool  Significant tenderness or worsening of abdominal pains  Swelling of the abdomen that is new, acute  Fever of 100F or higher   Following upper endoscopy (EGD)  Vomiting of blood or coffee ground material  New chest pain or pain under the shoulder blades  Painful or persistently difficult swallowing  New shortness of breath  Fever of 100F or higher  Black, tarry-looking stools  Please see handouts given to you on Polyps, Diverticulosis, Gastritis and GERD.  Dr. Tarri Glenn increased your Omeprazole to 40 mg daily for 8 weeks.  Avoid NSAIDS!  For urgent or emergent issues, a  gastroenterologist can be reached at any hour by calling [phone].   DIET:  We do recommend a small meal at first, but then you may proceed to your regular diet.  Drink plenty of fluids but you should avoid alcoholic beverages for 24 hours.  ACTIVITY:  You should plan to take it easy for the rest of today and you should NOT DRIVE or use heavy machinery until tomorrow (because of the sedation medicines used during the test).    FOLLOW UP: Our staff will call the number listed on your records the next business day following your procedure to check on you and address any questions or concerns that you may have regarding the information given to you following your procedure. If we do not reach you, we will leave a message.  However, if you are feeling well and you are not experiencing any problems, there is no need to return our call.  We will assume that you have returned to your regular daily activities without incident.  If any biopsies were taken you will be contacted by phone or by letter within the next 1-3 weeks.  Please call us at [phone] if you have not heard about the biopsies in 3 weeks.    SIGNATURES/CONFIDENTIALITY: You and/or your care partner have signed paperwork which will be entered into your electronic medical record.  These signatures attest to the fact that that the information above on your After Visit Summary has been reviewed and is  understood.  Full responsibility of the confidentiality of this discharge information lies with you and/or your care-partner.  Thank you for letting us take care of your healthcare needs today.

## 2018-04-29 NOTE — Progress Notes (Signed)
Called to room to assist during endoscopic procedure.  Patient ID and intended procedure confirmed with present staff. Received instructions for my participation in the procedure from the performing physician.  

## 2018-04-30 ENCOUNTER — Telehealth: Payer: Self-pay | Admitting: *Deleted

## 2018-04-30 ENCOUNTER — Telehealth: Payer: Self-pay

## 2018-04-30 NOTE — Telephone Encounter (Signed)
No answer for post procedure call back. Left message and will attempt to call back later this afternoon. SM 

## 2018-04-30 NOTE — Telephone Encounter (Deleted)
  Follow up Call-  Call back number 04/29/2018  Post procedure Call Back phone  # [phone]  Permission to leave phone message Yes  Some recent data might be hidden     Patient questions:  Do you have a fever, pain , or abdominal swelling? {yes no:314532} Pain Score  {NUMBERS; 0-10:5044} *  Have you tolerated food without any problems? {yes no:314532}  Have you been able to return to your normal activities? {yes no:314532}  Do you have any questions about your discharge instructions: Diet   {yes no:314532} Medications  {yes no:314532} Follow up visit  {yes no:314532}  Do you have questions or concerns about your Care? {yes no:314532}  Actions: * If pain score is 4 or above: {ACTION; [facility] ENDO PAIN >4:21563::"No action needed, pain <4."}

## 2018-04-30 NOTE — Telephone Encounter (Signed)
Attempted to reach pt. With follow-up call following endoscopic procedure 04/29/2018.  LM on pt. Voice mail, though uncertain if the message recorded.  Called the no. Again to see if I received the same electronic cues, and did.  Unsure if message recorded.

## 2018-05-04 ENCOUNTER — Other Ambulatory Visit: Payer: Self-pay | Admitting: Adult Health

## 2018-05-04 ENCOUNTER — Encounter: Payer: Self-pay | Admitting: Internal Medicine

## 2018-05-04 ENCOUNTER — Ambulatory Visit: Payer: Managed Care, Other (non HMO) | Admitting: Internal Medicine

## 2018-05-04 VITALS — BP 150/90 | HR 57 | Wt 282.0 lb

## 2018-05-04 DIAGNOSIS — E661 Drug-induced obesity: Secondary | ICD-10-CM | POA: Diagnosis not present

## 2018-05-04 DIAGNOSIS — Z794 Long term (current) use of insulin: Secondary | ICD-10-CM | POA: Diagnosis not present

## 2018-05-04 DIAGNOSIS — E1165 Type 2 diabetes mellitus with hyperglycemia: Secondary | ICD-10-CM | POA: Diagnosis not present

## 2018-05-04 DIAGNOSIS — Z6832 Body mass index (BMI) 32.0-32.9, adult: Secondary | ICD-10-CM

## 2018-05-04 DIAGNOSIS — E781 Pure hyperglyceridemia: Secondary | ICD-10-CM | POA: Diagnosis not present

## 2018-05-04 DIAGNOSIS — N529 Male erectile dysfunction, unspecified: Secondary | ICD-10-CM

## 2018-05-04 LAB — POCT GLYCOSYLATED HEMOGLOBIN (HGB A1C): HEMOGLOBIN A1C: 6.7 % — AB (ref 4.0–5.6)

## 2018-05-04 MED ORDER — DULAGLUTIDE 1.5 MG/0.5ML ~~LOC~~ SOAJ: 1.5000 mg | SUBCUTANEOUS | 3 refills | Status: DC

## 2018-05-04 MED ORDER — INSULIN GLARGINE 100 UNIT/ML SOLOSTAR PEN: PEN_INJECTOR | SUBCUTANEOUS | 3 refills | Status: DC

## 2018-05-04 NOTE — Patient Instructions (Addendum)
Please increase Trulicity to 1.5 mg weekly.  After 1 week, try to decrease Lantus to 24 units in am.  Please return in 4 months with your sugar log.   Food Choices to Lower Your Triglycerides Triglycerides are a type of fat in your blood. High levels of triglycerides can increase the risk of heart disease and stroke. If your triglyceride levels are high, the foods you eat and your eating habits are very important. Choosing the right foods can help lower your triglycerides. What general guidelines do I need to follow?  Lose weight if you are overweight.  Limit or avoid alcohol.  Fill one half of your plate with vegetables and green salads.  Limit fruit to two servings a day. Choose fruit instead of juice.  Make one fourth of your plate whole grains. Look for the word "whole" as the first word in the ingredient list.  Fill one fourth of your plate with lean protein foods.  Enjoy fatty fish (such as salmon, mackerel, sardines, and tuna) three times a week.  Choose healthy fats.  Limit foods high in starch and sugar.  Eat more home-cooked food and less restaurant, buffet, and fast food.  Limit fried foods.  Cook foods using methods other than frying.  Limit saturated fats.  Check ingredient lists to avoid foods with partially hydrogenated oils (trans fats) in them. What foods can I eat? Grains Whole grains, such as whole wheat or whole grain breads, crackers, cereals, and pasta. Unsweetened oatmeal, bulgur, barley, quinoa, or brown rice. Corn or whole wheat flour tortillas. Vegetables Fresh or frozen vegetables (raw, steamed, roasted, or grilled). Green salads. Fruits All fresh, canned (in natural juice), or frozen fruits. Meat and Other Protein Products Ground beef (85% or leaner), grass-fed beef, or beef trimmed of fat. Skinless chicken or [place]. Ground chicken or [place]. Pork trimmed of fat. All fish and seafood. Eggs. Dried beans, peas, or lentils. Unsalted nuts or  seeds. Unsalted canned or dry beans. Dairy Low-fat dairy products, such as skim or 1% milk, 2% or reduced-fat cheeses, low-fat ricotta or cottage cheese, or plain low-fat yogurt. Fats and Oils Tub margarines without trans fats. Light or reduced-fat mayonnaise and salad dressings. Avocado. Safflower, olive, or canola oils. Natural peanut or almond butter. The items listed above may not be a complete list of recommended foods or beverages. Contact your dietitian for more options. What foods are not recommended? Grains White bread. White pasta. White rice. Cornbread. Bagels, pastries, and croissants. Crackers that contain trans fat. Vegetables White potatoes. Corn. Creamed or fried vegetables. Vegetables in a cheese sauce. Fruits Dried fruits. Canned fruit in light or heavy syrup. Fruit juice. Meat and Other Protein Products Fatty cuts of meat. Ribs, chicken wings, bacon, sausage, bologna, salami, chitterlings, fatback, hot dogs, bratwurst, and packaged luncheon meats. Dairy Whole or 2% milk, cream, half-and-half, and cream cheese. Whole-fat or sweetened yogurt. Full-fat cheeses. Nondairy creamers and whipped toppings. Processed cheese, cheese spreads, or cheese curds. Sweets and Desserts Corn syrup, sugars, honey, and molasses. Candy. Jam and jelly. Syrup. Sweetened cereals. Cookies, pies, cakes, donuts, muffins, and ice cream. Fats and Oils Butter, stick margarine, lard, shortening, ghee, or bacon fat. Coconut, palm kernel, or palm oils. Beverages Alcohol. Sweetened drinks (such as sodas, lemonade, and fruit drinks or punches). The items listed above may not be a complete list of foods and beverages to avoid. Contact your dietitian for more information. This information is not intended to replace advice given to you by your health  care provider. Make sure you discuss any questions you have with your health care provider. Document Released: 03/28/2004 Document Revised: 11/16/2015 Document  Reviewed: 04/14/2013 Elsevier Interactive Patient Education  2017 [employer].

## 2018-05-04 NOTE — Progress Notes (Signed)
Patient ID: Keith Leblanc, male   DOB: 1965-11-20, 52 y.o.   MRN: [id number]   HPI: Keith Leblanc is a 52 y.o.-year-old male, returning for f/u for  DM2, dx in 06/2016 (prev. Prediabetes since 2015-16), insulin-dependent, uncontrolled, without long term complications. Last visit 1 year ago.   Reviewed hx: Patient was recently diagnosed with diabetes in 07/07/2016, when he presented to the hospital in DKA. An A1c was 12%. His CBG was 1081. Retrospectively, he had increased urination, increased thirst, blurred vision, fatigue, rapid weight loss of 25 lbs. He had a lot of stress at work + drinking A LOT of sweet drinks (4 cans of soda a day + gatorade + juice). He is now off these but still working on decreasing Alcohol - now 4-5 beers a day.  He was started on insulin inhouse, and his CBG is greatly improved afterwards. Less than a month later, his HbA1c decreased to 9.6%.  Last hemoglobin A1c was: Lab Results  Component Value Date   HGBA1C 5.6 01/21/2017   HGBA1C 9.6 08/01/2016   HGBA1C 12.0 (H) 07/07/2016   We confirmed type II rather than type 1 diabetes: Component     Latest Ref Rng & Units 08/27/2016  Glucose     70 - 99 mg/dL 118 (H)  Pancreatic Islet Cell Antibody     <5 JDF Units <5  Glutamic Acid Decarb Ab     <5 IU/mL <5  C-Peptide     0.80 - 3.85 ng/mL 1.45   Pt was on a regimen of: - Metformin 500 mg 0-1x a day with dinner >> could not tolerate this 2/2 nausea - Basaglar 40 units at bedtime - takes 2-3x a week - Novolog 6 units 3x a day, before meals >> stopped 44/3154  Now on: - Trulicity 0.08 mg weekly - Lantus 30 units daily in am He did not remember that I told him to try to stop Lantus at last visit...  Pt checks his sugars 1x a day: - am:  "around 138", 1x 198 >> 676-195, 093 off Trulicity >> 26-712 - 2h after b'fast: 107-122 >> n/c - before lunch: 97-134 >> ? >> n/c - 2h after lunch: n/c - before dinner: 94, 126 >> ? >> n/c  - 2h after dinner: n/c -  bedtime: n/c - nighttime: n/c Lowest sugar was 76 >> 101 >> 109 >> 93; unclear at which level he has hypoglycemia awareness. Highest sugar was 226 (alcohol) >> 200 >> 199 >> 155.  Glucometer: One Touch Ultra  Pt's meals are: - Breakfast: 2-3 boiled eggs, sausage, cheese, diet tea - Lunch: salad, chicken or pork, coke zero - Dinner: sea food + veggies + brown rice, coke zero - Snacks: water  - No CKD although his latest anatomy was higher, last BUN/creatinine:  Lab Results  Component Value Date   BUN 17 04/03/2018   BUN 12 05/08/2017   CREATININE 1.34 04/03/2018   CREATININE 1.17 05/08/2017   - +HL (with very high triglycerides at last check): Lab Results  Component Value Date   CHOL 240 (H) 04/03/2018   HDL 43.00 04/03/2018   LDLCALC 143 (H) 08/27/2016   LDLDIRECT 94.0 04/03/2018   TRIG 1,227.0 (H) 04/03/2018   CHOLHDL 6 04/03/2018   - last eye exam was in 06/2016: No Dr - no numbness and tingling in his feet.  ROS: Constitutional: + weight gain/no weight loss, no fatigue, no subjective hyperthermia, no subjective hypothermia, + nocturia Eyes: no blurry vision, no xerophthalmia ENT:  no sore throat, no nodules palpated in neck, no dysphagia, no odynophagia, no hoarseness Cardiovascular: no CP/no SOB/no palpitations/no leg swelling Respiratory: no cough/no SOB/no wheezing Gastrointestinal: no N/no V/no D/no C/no acid reflux Musculoskeletal: no muscle aches/no joint aches Skin: no rashes, no hair loss Neurological: no tremors/no numbness/no tingling/no dizziness  I reviewed pt's medications, allergies, PMH, social hx, family hx, and changes were documented in the history of present illness. Otherwise, unchanged from my initial visit note. Started Writer.  Past Medical History:  Diagnosis Date  . Blood in stool    bright red blood   . Chicken pox   . Diabetes mellitus ([place])   . Erectile dysfunction   . Gout   . Hypertension    Past Surgical History:  Procedure  Laterality Date  . APPENDECTOMY  2004   Social History   Social History  . Marital status: Single    Spouse name: N/A  . Number of children: 1 - 29 y/o in 2018   Occupational History  . Customer Therapist, nutritional    Social History Main Topics  . Smoking status: Former Research scientist (life sciences)  . Smokeless tobacco: Started to chew tobacco   . Alcohol use      Comment: 4-5 beers a day   . Drug use: Yes    Types: Marijuana  . Sexual activity: Yes   Social History Narrative   Works for [employer]    Not married    One daughter who does not live with him    Likes to gamble, walk in the parks, travel.    Current Outpatient Medications on File Prior to Visit  Medication Sig Dispense Refill  . amLODipine (NORVASC) 10 MG tablet TAKE 1 TABLET BY MOUTH EVERY DAY 90 tablet 3  . blood glucose meter kit and supplies Dispense based on patient and insurance preference. Use up to four times daily as directed. (FOR ICD-9 250.00, 250.01). 1 each 0  . colchicine 0.6 MG tablet Take 0.6 mg by mouth 2 (two) times daily. Reported on 06/09/2015  0  . Dulaglutide (TRULICITY) 5.09 TO/6.7TI SOPN Inject one pen weekly. NEED APPT FOR MORE REFILLS 6 mL 0  . indomethacin (INDOCIN) 50 MG capsule Take 1 capsule by mouth 3 times a day. 30 capsule 0  . Insulin Glargine (LANTUS SOLOSTAR) 100 UNIT/ML Solostar Pen INJECT 30 UNITS  SUBCUTANEOUSLY AT 10 PM  DAILY 30 mL 0  . Insulin Pen Needle (PEN NEEDLES 3/16") 31G X 5 MM MISC 6-40 Units by Does not apply route 4 (four) times daily - after meals and at bedtime. 100 each 6  . Lancets (ONETOUCH ULTRASOFT) lancets Test blood sugars as directed. Dx E11.9 100 each 12  . nebivolol (BYSTOLIC) 10 MG tablet Take 1 tablet (10 mg total) by mouth daily. 90 tablet 1  . omeprazole (PRILOSEC) 40 MG capsule Take 1 capsule (40 mg total) by mouth daily. Take 40 mg daily for 8 weeks. 30 capsule 1  . ONE TOUCH ULTRA TEST test strip USE UP TO 4 TIMES DAILY AS DIRECTED 100 each 3  . sildenafil (REVATIO) 20 MG  tablet TAKE 2-5 TABLETS BY MOUTH AS NEEDED FOR SEXUAL ACTIVITY 50 tablet 3   No current facility-administered medications on file prior to visit.    No Known Allergies Family History  Problem Relation Age of Onset  . Alcohol abuse Father   . Hypertension Father   . Heart disease Father   . Breast cancer Mother   . Hypertension Mother   .  Colon cancer Neg Hx   . Esophageal cancer Neg Hx   . Rectal cancer Neg Hx   . Stomach cancer Neg Hx   Pt has FH of DM in mother.  PE: BP (!) 150/90   Pulse (!) 57   Wt 282 lb (127.9 kg)   SpO2 97%   BMI 34.33 kg/m  Wt Readings from Last 3 Encounters:  05/04/18 282 lb (127.9 kg)  04/29/18 277 lb (125.6 kg)  04/03/18 277 lb (125.6 kg)   Constitutional: overweight, in NAD Eyes: PERRLA, EOMI, no exophthalmos ENT: moist mucous membranes, no thyromegaly, no cervical lymphadenopathy Cardiovascular: RRR, No MRG Respiratory: CTA B Gastrointestinal: abdomen soft, NT, ND, BS+ Musculoskeletal: no deformities, strength intact in all 4 Skin: moist, warm, no rashes Neurological: no tremor with outstretched hands, DTR normal in all 4  ASSESSMENT: 1. DM2, -insulin-dependent, uncontrolled, without long term complications, but with hyperglycemia  2. Obesity class 1 BMI Classification:  < 18.5 underweight   18.5-24.9 normal weight   25.0-29.9 overweight   30.0-34.9 class I obesity   35.0-39.9 class II obesity   ? 40.0 class III obesity   3.  Hypertriglyceridemia  PLAN:  1. Patient with history of uncontrolled diabetes, currently on basal insulin and GLP-1 receptor agonist.  He returns after 1 year absence.  He came 1 day earlier for his appointment.  He is still on low-dose of Trulicity and he did not attempt to stop the Lantus as advised at last visit.  However, sugars are at or close to goal, but not very low, so for now, I advised him to continue Lantus -He does complain of weight gain and we discussed about improving his diet but will  also try to increase her Trulicity dose to help with weight loss while we try to decrease the Lantus.  Of note, we cannot use metformin due to nausea. - I suggested to:  Patient Instructions  Please increase Trulicity to 1.5 mg weekly.  After 1 week, try to decrease Lantus to 24 units in am.  Please return in 4 months with your sugar log.   - today, HbA1c is 6.7% (higher, but still at goal) - continue checking sugars at different times of the day - check 1x a day, but I advised him to start rotating checks, as now he is only checking in the morning - advised for yearly eye exams >> he is not UTD - refuses flu shot - Return to clinic in 3 mo with sugar log   2. Obesity class 1 -Weight higher by at least 10 pounds -We will continue Trulicity which should also help with weight loss, but will increase the dose to 1.5 mg weekly -We will also start decreasing his insulin while we increase Trulicity, and this should also help  3. HTG -patient's triglycerides or very high at last check.  PCP asked him to come back for repeat.  He did not change his diet after the results return. -We discussed about the absolute need to improve his diet and I gave him diet instructions (see patient instructions) -We discussed about the risk of pancreatitis with such high triglycerides  Philemon Kingdom, MD PhD [facility] Endocrinology

## 2018-05-05 ENCOUNTER — Ambulatory Visit: Payer: 59 | Admitting: Internal Medicine

## 2018-05-05 NOTE — Addendum Note (Signed)
Addended by: Cardell Peach I on: 05/05/2018 07:42 AM   Modules accepted: Orders

## 2018-05-05 NOTE — Telephone Encounter (Signed)
Sent to the pharmacy by e-scribe. 

## 2018-05-07 ENCOUNTER — Encounter: Payer: Self-pay | Admitting: Gastroenterology

## 2018-05-29 ENCOUNTER — Telehealth: Payer: Self-pay | Admitting: Internal Medicine

## 2018-05-29 NOTE — Telephone Encounter (Signed)
Dani from [employer] is calling regard a PA for [employer], He states he has assistance to offer.

## 2018-06-02 ENCOUNTER — Telehealth: Payer: Self-pay

## 2018-06-02 NOTE — Telephone Encounter (Signed)
PA form for Trulicity has been filled out and faxed.

## 2018-06-08 ENCOUNTER — Telehealth: Payer: Self-pay | Admitting: Internal Medicine

## 2018-06-08 NOTE — Telephone Encounter (Signed)
We received documentation that this has already been approved as of 06/02/18.

## 2018-06-08 NOTE — Telephone Encounter (Signed)
Rep called from [employer] [employer] [phone] with ref key #[id number].  Regarding medical information regarding a prior auth on Trulicity

## 2018-06-18 ENCOUNTER — Emergency Department (HOSPITAL_COMMUNITY)
Admission: EM | Admit: 2018-06-18 | Discharge: 2018-06-18 | Disposition: A | Discharge status: Home / Self Care | Payer: Managed Care, Other (non HMO) | Attending: Emergency Medicine | Admitting: Emergency Medicine

## 2018-06-18 ENCOUNTER — Other Ambulatory Visit: Payer: Self-pay

## 2018-06-18 DIAGNOSIS — Z794 Long term (current) use of insulin: Secondary | ICD-10-CM | POA: Diagnosis not present

## 2018-06-18 DIAGNOSIS — E1165 Type 2 diabetes mellitus with hyperglycemia: Secondary | ICD-10-CM | POA: Insufficient documentation

## 2018-06-18 DIAGNOSIS — I10 Essential (primary) hypertension: Secondary | ICD-10-CM | POA: Insufficient documentation

## 2018-06-18 DIAGNOSIS — Z87891 Personal history of nicotine dependence: Secondary | ICD-10-CM | POA: Diagnosis not present

## 2018-06-18 DIAGNOSIS — R739 Hyperglycemia, unspecified: Secondary | ICD-10-CM

## 2018-06-18 DIAGNOSIS — Z79899 Other long term (current) drug therapy: Secondary | ICD-10-CM | POA: Insufficient documentation

## 2018-06-18 LAB — COMPREHENSIVE METABOLIC PANEL
ALK PHOS: 61 U/L (ref 38–126)
ALT: 30 U/L (ref 0–44)
ANION GAP: 13 (ref 5–15)
AST: 41 U/L (ref 15–41)
Albumin: 4.6 g/dL (ref 3.5–5.0)
BUN: 20 mg/dL (ref 6–20)
CO2: 26 mmol/L (ref 22–32)
Calcium: 9.3 mg/dL (ref 8.9–10.3)
Chloride: 96 mmol/L — ABNORMAL LOW (ref 98–111)
Creatinine, Ser: 1.32 mg/dL — ABNORMAL HIGH (ref 0.61–1.24)
GFR calc Af Amer: >60 mL/min (ref >60)
GFR calc non Af Amer: >60 mL/min (ref >60)
GLUCOSE: 315 mg/dL — AB (ref 70–99)
Potassium: 4 mmol/L (ref 3.5–5.1)
Sodium: 135 mmol/L (ref 135–145)
Total Bilirubin: 1.6 mg/dL — ABNORMAL HIGH (ref 0.3–1.2)
Total Protein: 8.1 g/dL (ref 6.5–8.1)

## 2018-06-18 LAB — URINALYSIS, ROUTINE W REFLEX MICROSCOPIC
Bacteria, UA: NONE SEEN
Bilirubin Urine: NEGATIVE
Glucose, UA: >=500 mg/dL — AB
Hgb urine dipstick: NEGATIVE
Ketones, ur: 20 mg/dL — AB
Leukocytes, UA: NEGATIVE
Nitrite: NEGATIVE
Protein, ur: NEGATIVE mg/dL
Specific Gravity, Urine: 1.029 (ref 1.005–1.030)
pH: 5 (ref 5.0–8.0)

## 2018-06-18 LAB — CBC WITH DIFFERENTIAL/PLATELET
Abs Immature Granulocytes: 0.01 10*3/uL (ref 0.00–0.07)
Basophils Absolute: 0.1 10*3/uL (ref 0.0–0.1)
Basophils Relative: 1 %
EOS PCT: 1 %
Eosinophils Absolute: 0.1 10*3/uL (ref 0.0–0.5)
HCT: 42.6 % (ref 39.0–52.0)
HEMOGLOBIN: 13.7 g/dL (ref 13.0–17.0)
Immature Granulocytes: 0 %
LYMPHS PCT: 39 %
Lymphs Abs: 2.7 10*3/uL (ref 0.7–4.0)
MCH: 30.2 pg (ref 26.0–34.0)
MCHC: 32.2 g/dL (ref 30.0–36.0)
MCV: 93.8 fL (ref 80.0–100.0)
Monocytes Absolute: 0.4 10*3/uL (ref 0.1–1.0)
Monocytes Relative: 6 %
Neutro Abs: 3.8 10*3/uL (ref 1.7–7.7)
Neutrophils Relative %: 53 %
Platelets: 404 10*3/uL — ABNORMAL HIGH (ref 150–400)
RBC: 4.54 MIL/uL (ref 4.22–5.81)
RDW: 13.2 % (ref 11.5–15.5)
WBC: 7.1 10*3/uL (ref 4.0–10.5)
nRBC: 0 % (ref 0.0–0.2)

## 2018-06-18 LAB — BLOOD GAS, VENOUS
Acid-Base Excess: 3.3 mmol/L — ABNORMAL HIGH (ref 0.0–2.0)
BICARBONATE: 28.7 mmol/L — AB (ref 20.0–28.0)
O2 Saturation: 46.7 %
PH VEN: 7.387 (ref 7.250–7.430)
Patient temperature: 98.6
pCO2, Ven: 48.9 mmHg (ref 44.0–60.0)

## 2018-06-18 LAB — CBG MONITORING, ED
GLUCOSE-CAPILLARY: 261 mg/dL — AB (ref 70–99)
Glucose-Capillary: 361 mg/dL — ABNORMAL HIGH (ref 70–99)
Glucose-Capillary: 284 mg/dL — ABNORMAL HIGH (ref 70–99)

## 2018-06-18 MED ORDER — INSULIN ASPART 100 UNIT/ML ~~LOC~~ SOLN
10.0000 [IU] | Freq: Once | SUBCUTANEOUS | Status: AC
  Administered 2018-06-18: 10 [IU] via SUBCUTANEOUS
  Filled 2018-06-18: qty 1

## 2018-06-18 MED ORDER — LACTATED RINGERS IV BOLUS
1000.0000 mL | Freq: Once | INTRAVENOUS | Status: AC
  Administered 2018-06-18: 1000 mL via INTRAVENOUS

## 2018-06-18 NOTE — ED Provider Notes (Signed)
[facility] DEPT Provider Note   CSN: [id number] Arrival date & time: 06/18/18  1232     History   Chief Complaint Chief Complaint  Patient presents with  . Hyperglycemia    HPI Keith Leblanc is a 52 y.o. male.  The history is provided by the patient.  Hyperglycemia  Blood sugar level PTA:  476 Severity:  Moderate Onset quality:  Gradual Duration:  2 days Timing:  Constant Progression:  Worsening Chronicity:  Recurrent Diabetes status:  Controlled with insulin and controlled with oral medications Current diabetic therapy:  Insulin and trulicity (recently stopped this per endocrinology recommendations) Context: change in medication   Relieved by:  None tried Ineffective treatments:  None tried Associated symptoms: blurred vision, dehydration, increased thirst and polyuria     Past Medical History:  Diagnosis Date  . Blood in stool    bright red blood   . Chicken pox   . Diabetes mellitus ([place])   . Erectile dysfunction   . Gout   . Hypertension     Patient Active Problem List   Diagnosis Date Noted  . Hypertriglyceridemia 05/04/2018  . Class 1 drug-induced obesity with serious comorbidity and body mass index (BMI) of 32.0 to 32.9 in adult 01/21/2017  . Type 2 diabetes mellitus with hyperglycemia ([place]) 08/27/2016  . Protein-calorie malnutrition, severe 07/08/2016  . Diabetic ketoacidosis without coma associated with type 2 diabetes mellitus ([place]) 07/07/2016  . Acute renal failure (ARF) ([place]) 07/07/2016  . Hyponatremia 07/07/2016  . Hyperkalemia 07/07/2016  . Obesity (BMI 30-39.9) 07/07/2016  . Tobacco abuse 07/07/2016  . Overweight (BMI 25.0-29.9)   . Blood in stool 05/21/2016  . Essential hypertension 04/28/2015  . Erectile dysfunction 04/28/2015  . Gout 04/28/2015    Past Surgical History:  Procedure Laterality Date  . APPENDECTOMY  2004        Home Medications    Prior to Admission medications   Medication  Sig Start Date End Date Taking? Authorizing Provider  amLODipine (NORVASC) 10 MG tablet TAKE 1 TABLET BY MOUTH EVERY DAY Patient taking differently: Take 10 mg by mouth daily.  05/09/17  Yes Nafziger, Tommi Rumps, NP  colchicine 0.6 MG tablet Take 1.2 mg by mouth 2 (two) times daily as needed (gout flare ups).  01/25/15  Yes [provider]  Dulaglutide (TRULICITY) 1.5 IH/0.3UU SOPN Inject 1.5 mg into the skin once a week. 05/04/18  Yes Philemon Kingdom, MD  indomethacin (INDOCIN) 50 MG capsule Take 1 capsule by mouth 3 times a day. 06/06/17  Yes Nafziger, Tommi Rumps, NP  Insulin Glargine (LANTUS SOLOSTAR) 100 UNIT/ML Solostar Pen INJECT 24 UNITS  SUBCUTANEOUSLY AT 10 PM  DAILY Patient taking differently: Inject 30 Units into the skin daily.  05/04/18  Yes Philemon Kingdom, MD  Multiple Vitamins-Minerals (MEGA MULTIVITAMIN FOR MEN) TABS Take 2 tablets by mouth daily.   Yes [provider]  nebivolol (BYSTOLIC) 10 MG tablet Take 1 tablet (10 mg total) by mouth daily. 10/31/17  Yes Nafziger, Tommi Rumps, NP  omeprazole (PRILOSEC) 40 MG capsule Take 1 capsule (40 mg total) by mouth daily. Take 40 mg daily for 8 weeks. 04/29/18  Yes [place], MD  sildenafil (REVATIO) 20 MG tablet TAKE 2-5 TABLETS BY MOUTH AS NEEDED FOR SEXUAL ACTIVITY Patient taking differently: Take 40-100 mg by mouth as needed (sexual activity).  05/05/18  Yes Nafziger, Tommi Rumps, NP  blood glucose meter kit and supplies Dispense based on patient and insurance preference. Use up to four times daily as  directed. (FOR ICD-9 250.00, 250.01). 07/09/16   Eugenie Filler, MD  Insulin Pen Needle (PEN NEEDLES 3/16") 31G X 5 MM MISC 6-40 Units by Does not apply route 4 (four) times daily - after meals and at bedtime. 12/15/17   Philemon Kingdom, MD  Lancets [facility] ULTRASOFT) lancets Test blood sugars as directed. Dx E11.9 08/06/16   Dorothyann Peng, NP  ONE TOUCH ULTRA TEST test strip USE UP TO 4 TIMES DAILY AS DIRECTED 08/30/16   Laurey Morale, MD    Family History Family History  Problem Relation Age of Onset  . Alcohol abuse Father   . Hypertension Father   . Heart disease Father   . Breast cancer Mother   . Hypertension Mother   . Colon cancer Neg Hx   . Esophageal cancer Neg Hx   . Rectal cancer Neg Hx   . Stomach cancer Neg Hx     Social History Social History   Tobacco Use  . Smoking status: Former Research scientist (life sciences)  . Smokeless tobacco: Current User    Types: Chew  Substance Use Topics  . Alcohol use: Yes    Alcohol/week: 4.0 - 5.0 standard drinks    Types: 4 - 5 Cans of beer per week  . Drug use: Not Currently    Types: Marijuana     Allergies   Patient has no known allergies.   Review of Systems Review of Systems  Eyes: Positive for blurred vision.  Endocrine: Positive for polydipsia and polyuria.  All other systems reviewed and are negative.    Physical Exam Updated Vital Signs BP (!) 162/99 (BP Location: Left Arm)   Pulse 66   Temp 98.6 F (37 C) (Oral)   Resp 18   Ht '6\' 5"'$  (1.956 m)   Wt 124.3 kg   SpO2 98%   BMI 32.49 kg/m   Physical Exam Vitals signs and nursing note reviewed.  Constitutional:      Appearance: He is well-developed.  HENT:     Head: Normocephalic and atraumatic.  Neck:     Musculoskeletal: Normal range of motion.  Cardiovascular:     Rate and Rhythm: Normal rate.  Pulmonary:     Effort: Pulmonary effort is normal. No respiratory distress.  Abdominal:     General: There is no distension.  Musculoskeletal: Normal range of motion.  Skin:    General: Skin is warm and dry.  Neurological:     General: No focal deficit present.     Mental Status: He is alert.      ED Treatments / Results  Labs (all labs ordered are listed, but only abnormal results are displayed) Labs Reviewed  CBC WITH DIFFERENTIAL/PLATELET - Abnormal; Notable for the following components:      Result Value   Platelets 404 (*)    All other components within normal limits  COMPREHENSIVE  METABOLIC PANEL - Abnormal; Notable for the following components:   Chloride 96 (*)    Glucose, Bld 315 (*)    Creatinine, Ser 1.32 (*)    Total Bilirubin 1.6 (*)    All other components within normal limits  URINALYSIS, ROUTINE W REFLEX MICROSCOPIC - Abnormal; Notable for the following components:   Color, Urine AMBER (*)    APPearance CLOUDY (*)    Glucose, UA >=500 (*)    Ketones, ur 20 (*)    All other components within normal limits  BLOOD GAS, VENOUS - Abnormal; Notable for the following components:   Bicarbonate 28.7 (*)  Acid-Base Excess 3.3 (*)    All other components within normal limits  CBG MONITORING, ED - Abnormal; Notable for the following components:   Glucose-Capillary 361 (*)    All other components within normal limits  CBG MONITORING, ED - Abnormal; Notable for the following components:   Glucose-Capillary 284 (*)    All other components within normal limits  CBG MONITORING, ED - Abnormal; Notable for the following components:   Glucose-Capillary 261 (*)    All other components within normal limits    EKG None  Radiology No results found.  Procedures Procedures (including critical care time)  Medications Ordered in ED Medications  lactated ringers bolus 1,000 mL (0 mLs Intravenous Stopped 06/18/18 1524)  insulin aspart (novoLOG) injection 10 Units (10 Units Subcutaneous Given 06/18/18 1529)     Initial Impression / Assessment and Plan / ED Course  I have reviewed the triage vital signs and the nursing notes.  Pertinent labs & imaging results that were available during my care of the patient were reviewed by me and considered in my medical decision making (see chart for details).     Hyperglycemia without evidence of DKA. Will work on blood sugars, follow up with endocrinology with glucose log for further medication changes.   Final Clinical Impressions(s) / ED Diagnoses   Final diagnoses:  Hyperglycemia    ED Discharge Orders    None        Toretto Tingler, Corene Cornea, MD 06/18/18 [phone]

## 2018-06-18 NOTE — ED Notes (Signed)
Informed Dr. Dayna Barker that patient blood sugar has dropped to 261mg /dL. He reports he will get him discharged.

## 2018-06-18 NOTE — ED Notes (Signed)
He tells me he was dx with D.M. 2 years ago. He states he has not recently taken his Trulicity d/t "cost". He is in no distress. He tells me he has some blurring of his vision, but otherwise feels quite well.

## 2018-06-18 NOTE — ED Triage Notes (Signed)
Pt reports checking CBG at lunch, was 447.  Pt reports only taking Lantus this AM.  Pt reports feeling lethargic, blurred vision.    CBG 361 in triage.   Denies n/v/SHOB.

## 2018-06-30 ENCOUNTER — Other Ambulatory Visit: Payer: Self-pay | Admitting: Adult Health

## 2018-07-01 NOTE — Telephone Encounter (Signed)
Sent to the pharmacy by e-scribe. 

## 2018-07-30 ENCOUNTER — Other Ambulatory Visit: Payer: Self-pay | Admitting: Internal Medicine

## 2018-08-03 ENCOUNTER — Telehealth: Payer: Self-pay | Admitting: Internal Medicine

## 2018-08-03 NOTE — Telephone Encounter (Signed)
MEDICATION: Lancets (ONETOUCH ULTRASOFT) lancets ONE TOUCH ULTRA TEST test strip  PHARMACY:  [id number] - [place], [place]  IS THIS A 90 DAY SUPPLY : Yes, if can  IS PATIENT OUT OF MEDICATION:   IF NOT; HOW MUCH IS LEFT: A few days  LAST APPOINTMENT DATE: @2 /11/2018  NEXT APPOINTMENT DATE:@3 /03/2019  DO WE HAVE YOUR PERMISSION TO LEAVE A DETAILED MESSAGE: Yes   OTHER COMMENTS:    **Let patient know to contact pharmacy at the end of the day to make sure medication is ready. **  ** Please notify patient to allow 48-72 hours to process**  **Encourage patient to contact the pharmacy for refills or they can request refills through [facility]**

## 2018-08-15 ENCOUNTER — Other Ambulatory Visit: Payer: Self-pay | Admitting: Adult Health

## 2018-08-18 NOTE — Telephone Encounter (Signed)
DENIED.  NEEDS TO BE FILLED BY GI.

## 2018-08-18 NOTE — Telephone Encounter (Signed)
[facility]

## 2018-08-18 NOTE — Telephone Encounter (Signed)
Last filled by GI.  Should they refill?

## 2018-08-19 ENCOUNTER — Other Ambulatory Visit: Payer: Self-pay | Admitting: Adult Health

## 2018-08-20 NOTE — Telephone Encounter (Signed)
SENT TO THE PHARMACY BY E-SCRIBE. 

## 2018-09-01 ENCOUNTER — Other Ambulatory Visit: Payer: Self-pay

## 2018-09-01 ENCOUNTER — Ambulatory Visit: Payer: Managed Care, Other (non HMO) | Admitting: Internal Medicine

## 2018-09-01 ENCOUNTER — Encounter: Payer: Self-pay | Admitting: Internal Medicine

## 2018-09-01 VITALS — BP 136/82 | HR 65 | Ht 76.0 in | Wt 269.0 lb

## 2018-09-01 DIAGNOSIS — E781 Pure hyperglyceridemia: Secondary | ICD-10-CM | POA: Diagnosis not present

## 2018-09-01 DIAGNOSIS — E1165 Type 2 diabetes mellitus with hyperglycemia: Secondary | ICD-10-CM

## 2018-09-01 DIAGNOSIS — Z794 Long term (current) use of insulin: Secondary | ICD-10-CM

## 2018-09-01 DIAGNOSIS — E661 Drug-induced obesity: Secondary | ICD-10-CM | POA: Diagnosis not present

## 2018-09-01 DIAGNOSIS — Z6832 Body mass index (BMI) 32.0-32.9, adult: Secondary | ICD-10-CM

## 2018-09-01 LAB — POCT GLYCOSYLATED HEMOGLOBIN (HGB A1C): Hemoglobin A1C: 6 % — AB (ref 4.0–5.6)

## 2018-09-01 NOTE — Patient Instructions (Addendum)
Please continue: - Trulicity 1.5 mg weekly - Lantus 24 units in a.m.  Have your cholesterol checked fasting.   Please return in 6 months with your sugar log.

## 2018-09-01 NOTE — Progress Notes (Signed)
Patient ID: Keith Leblanc, male   DOB: 11/17/1965, 53 y.o.   MRN: [id number]   HPI: Keith Leblanc is a 53 y.o.-year-old male, returning for f/u for  DM2, dx in 06/2016 (prev. Prediabetes since 2015-16), insulin-dependent, uncontrolled, without long term complications. Last visit 4 months ago.  He was in the ED 05/2018 for hyperglycemia (CBG 400s >> ED: 361). He was having blurry vision then.  Since then, he stopped sodas, he started reducing portions, stopped started to walk every day at work.  Sugars improved.  However, he tells me that he is still drinking alcohol every night: 1 pint (almost 0.5 L) of whiskey.  He is trying to get a plan in place to stop drinking.  Since last visit, he had gout in his index finger on the right hand.  He has had previous attacks in his elbow.  Reviewed history: Patient was recently diagnosed with diabetes in 07/07/2016, when he presented to the hospital in DKA. An A1c was 12%. His CBG was 1081. Retrospectively, he had increased urination, increased thirst, blurred vision, fatigue, rapid weight loss of 25 lbs. He had a lot of stress at work + drinking A LOT of sweet drinks (4 cans of soda a day + gatorade + juice). He is now off these but still working on decreasing Alcohol - 4-5 beers a day.  He was started on insulin inhouse, and his CBG is greatly improved afterwards. Less than a month later, his HbA1c decreased to 9.6%.  Last hemoglobin A1c was: Lab Results  Component Value Date   HGBA1C 6.7 (A) 05/04/2018   HGBA1C 5.6 01/21/2017   HGBA1C 9.6 08/01/2016   Pt was on a regimen of: - Metformin 500 mg 0-1x a day with dinner >> could not tolerate this 2/2 nausea - Basaglar 40 units at bedtime - takes 2-3x a week - Novolog 6 units 3x a day, before meals >> stopped 40/9811  Now on: - Trulicity 9.14 >> 1.5 mg weekly - Lantus 30 >> 24 units daily in am  Pt checks his sugars once a day: - am:  115-135, 199 >> 95-137 >> 120-145 - 2h after b'fast:  107-122 >> n/c - before lunch: 97-134 >> ? >> n/c >> 120-145 - 2h after lunch: n/c - before dinner: 94, 126 >> ? >> n/c  - 2h after dinner: n/c - bedtime: n/c >> 89-100 - nighttime: n/c Lowest sugar was 93 >> 89; it is unclear at which level he has hypoglycemia awareness Highest sugar was 226 (alcohol) >> 200 >> 199 >> 155 >> 400s x1, 190.  Glucometer: One Touch Ultra  Pt's meals are: - Breakfast: 2-3 boiled eggs, sausage, cheese, diet tea - Lunch: salad, chicken or pork, coke zero - Dinner: sea food + veggies + brown rice, coke zero - Snacks: water  -No history of CKD, but latest creatinine levels were higher: Lab Results  Component Value Date   BUN 20 06/18/2018   BUN 17 04/03/2018   CREATININE 1.32 (H) 06/18/2018   CREATININE 1.34 04/03/2018   -+ Hyperlipidemia: Lab Results  Component Value Date   CHOL 240 (H) 04/03/2018   HDL 43.00 04/03/2018   LDLCALC 143 (H) 08/27/2016   LDLDIRECT 94.0 04/03/2018   TRIG 1,227.0 (H) 04/03/2018   CHOLHDL 6 04/03/2018   - last eye exam was in 06/2016: No DR -He denies numbness and tingling in his feet.  ROS: Constitutional: no weight gain/no weight loss, no fatigue, no subjective hyperthermia, no subjective hypothermia Eyes: no  blurry vision, no xerophthalmia ENT: no sore throat, no nodules palpated in neck, no dysphagia, no odynophagia, no hoarseness Cardiovascular: no CP/no SOB/no palpitations/no leg swelling Respiratory: no cough/no SOB/no wheezing Gastrointestinal: no N/no V/no D/no C/no acid reflux Musculoskeletal: no muscle aches/+ joint aches Skin: no rashes, no hair loss Neurological: no tremors/no numbness/no tingling/no dizziness  I reviewed pt's medications, allergies, PMH, social hx, family hx, and changes were documented in the history of present illness. Otherwise, unchanged from my initial visit note.  Past Medical History:  Diagnosis Date  . Blood in stool    bright red blood   . Chicken pox   . Diabetes  mellitus ([place])   . Erectile dysfunction   . Gout   . Hypertension    Past Surgical History:  Procedure Laterality Date  . APPENDECTOMY  2004   Social History   Social History  . Marital status: Single    Spouse name: N/A  . Number of children: 1 - 45 y/o in 2018   Occupational History  . Customer Therapist, nutritional    Social History Main Topics  . Smoking status: Former Research scientist (life sciences)  . Smokeless tobacco: Started to chew tobacco   . Alcohol use      Comment: 4-5 beers a day   . Drug use: Yes    Types: Marijuana  . Sexual activity: Yes   Social History Narrative   Works for [employer]    Not married    One daughter who does not live with him    Likes to gamble, walk in the parks, travel.    Current Outpatient Medications on File Prior to Visit  Medication Sig Dispense Refill  . amLODipine (NORVASC) 10 MG tablet TAKE 1 TABLET BY MOUTH EVERY DAY 90 tablet 2  . blood glucose meter kit and supplies Dispense based on patient and insurance preference. Use up to four times daily as directed. (FOR ICD-9 250.00, 250.01). 1 each 0  . BYSTOLIC 10 MG tablet TAKE 1 TABLET BY MOUTH EVERY DAY 90 tablet 2  . colchicine 0.6 MG tablet Take 1.2 mg by mouth 2 (two) times daily as needed (gout flare ups).   0  . Dulaglutide (TRULICITY) 1.5 IW/8.0HO SOPN Inject 1.5 mg into the skin once a week. 12 pen 3  . indomethacin (INDOCIN) 50 MG capsule Take 1 capsule by mouth 3 times a day. 30 capsule 0  . Insulin Glargine (LANTUS SOLOSTAR) 100 UNIT/ML Solostar Pen INJECT 30 UNITS  SUBCUTANEOUSLY DAILY  AT 10 PM. 30 mL 3  . Insulin Pen Needle (PEN NEEDLES 3/16") 31G X 5 MM MISC 6-40 Units by Does not apply route 4 (four) times daily - after meals and at bedtime. 100 each 6  . Lancets (ONETOUCH ULTRASOFT) lancets Test blood sugars as directed. Dx E11.9 100 each 12  . Multiple Vitamins-Minerals (MEGA MULTIVITAMIN FOR MEN) TABS Take 2 tablets by mouth daily.    Marland Kitchen omeprazole (PRILOSEC) 40 MG capsule Take 1 capsule (40 mg  total) by mouth daily. Take 40 mg daily for 8 weeks. 30 capsule 1  . ONE TOUCH ULTRA TEST test strip USE UP TO 4 TIMES DAILY AS DIRECTED 100 each 3  . sildenafil (REVATIO) 20 MG tablet TAKE 2-5 TABLETS BY MOUTH AS NEEDED FOR SEXUAL ACTIVITY (Patient taking differently: Take 40-100 mg by mouth as needed (sexual activity). ) 50 tablet 3   No current facility-administered medications on file prior to visit.    No Known Allergies Family History  Problem  Relation Age of Onset  . Alcohol abuse Father   . Hypertension Father   . Heart disease Father   . Breast cancer Mother   . Hypertension Mother   . Colon cancer Neg Hx   . Esophageal cancer Neg Hx   . Rectal cancer Neg Hx   . Stomach cancer Neg Hx   Pt has FH of DM in mother.  PE: BP 136/82   Pulse 65   Ht 6' 4" (1.93 m) Comment: measured  Wt 269 lb (122 kg)   SpO2 97%   BMI 32.74 kg/m  Wt Readings from Last 3 Encounters:  09/01/18 269 lb (122 kg)  06/18/18 274 lb (124.3 kg)  05/04/18 282 lb (127.9 kg)   Constitutional: overweight, in NAD Eyes: PERRLA, EOMI, no exophthalmos ENT: moist mucous membranes, no thyromegaly, no cervical lymphadenopathy Cardiovascular: RRR, No MRG Respiratory: CTA B Gastrointestinal: abdomen soft, NT, ND, BS+ Musculoskeletal: no deformities, strength intact in all 4, + swollen right index joint Skin: moist, warm, no rashes Neurological: no tremor with outstretched hands, DTR normal in all 4  ASSESSMENT: 1. DM2, -insulin-dependent, uncontrolled, without long term complications, but with hyperglycemia  He has type 2, rather than type 1 diabetes: Component     Latest Ref Rng & Units 08/27/2016  Glucose     70 - 99 mg/dL 118 (H)  Pancreatic Islet Cell Antibody     <5 JDF Units <5  Glutamic Acid Decarb Ab     <5 IU/mL <5  C-Peptide     0.80 - 3.85 ng/mL 1.45   2. Obesity class 1 BMI Classification:  < 18.5 underweight   18.5-24.9 normal weight   25.0-29.9 overweight   30.0-34.9 class I  obesity   35.0-39.9 class II obesity   ? 40.0 class III obesity   3.  Hypertriglyceridemia  PLAN:  1. Patient with history of uncontrolled diabetes, on GLP-1 receptor agonist and Lantus.  At last visit, he was on low-dose Trulicity and sugars were controlled.  We continued Lantus but, as he had weight gain, increase Trulicity to target dose and I advised him to start decreasing the dose of the insulin if sugars improved further.  We also discussed about improving diet.  We cannot use metformin due to previous nausea with the medication. -At this visit, sugars are at or close to goal before meals, and they are lower at night, after he drinks alcohol.  We discussed about the absolute need to stop and he is working towards getting a plan in place for this. -We will not change his regimen for now but I congratulated him for the changes in his diet.  We also discussed about diet sodas, which I advised him to try not to drink. - I suggested to:  Patient Instructions  Please continue: - Trulicity 1.5 mg weekly - Lantus 24 units in a.m.  Have your cholesterol checked fasting.   Please return in 6 months with your sugar log.   - today, HbA1c is 6.0% (better) - continue checking sugars at different times of the day - check 1x a day, rotating checks - advised for yearly eye exams >> he is not UTD - Return to clinic in 6 mo with sugar log   2. Obesity class 1 -Continue Trulicity which should also help with weight loss -He was able to lose 13 pounds since last visit after the change in his diet!  3. HTG - Patient's triglycerides are very high when checked in 03/2018.  PCP asked him to come back for repeat.  At last visit, we discussed about the higher risk of pancreatitis with such high triglycerides.  I also strongly advised him to start changing his diet and gave him instructions at last visit.  He started to apply these, but we also discussed that alcohol will greatly raise his triglycerides  and again, I strongly encouraged him to try to stop - Reviewed latest lipid panel  Lab Results  Component Value Date   CHOL 240 (H) 04/03/2018   HDL 43.00 04/03/2018   LDLCALC 143 (H) 08/27/2016   LDLDIRECT 94.0 04/03/2018   TRIG 1,227.0 (H) 04/03/2018   CHOLHDL 6 04/03/2018  -I advised him to go without a lipid panel checked per PCPs instructions.  He is not fasting now so we did not check this today.  Philemon Kingdom, MD PhD [facility] Endocrinology

## 2018-10-20 ENCOUNTER — Other Ambulatory Visit: Payer: Self-pay

## 2018-10-20 ENCOUNTER — Ambulatory Visit (INDEPENDENT_AMBULATORY_CARE_PROVIDER_SITE_OTHER): Payer: Managed Care, Other (non HMO) | Admitting: Adult Health

## 2018-10-20 ENCOUNTER — Encounter: Payer: Self-pay | Admitting: Adult Health

## 2018-10-20 DIAGNOSIS — M10472 Other secondary gout, left ankle and foot: Secondary | ICD-10-CM

## 2018-10-20 MED ORDER — INDOMETHACIN 50 MG PO CAPS: 50.0000 mg | ORAL_CAPSULE | Freq: Three times a day (TID) | ORAL | 0 refills | Status: DC

## 2018-10-20 NOTE — Progress Notes (Signed)
Virtual Visit via Video Note  I connected with Keith Leblanc on 10/20/18 at  4:30 PM EDT by a video enabled telemedicine application and verified that I am speaking with the correct person using two identifiers.  Location patient: home Location provider:work or home office Persons participating in the virtual visit: patient, provider  I discussed the limitations of evaluation and management by telemedicine and the availability of in person appointments. The patient expressed understanding and agreed to proceed.   HPI: 53 year old male who is being evaluated today for concern of acute gout flare.  He reports since January this is his third episode of an acute gout flare.  In January 2020 and a gout flare in his right index finger and in March had a acute flare in his right knee, these eventually went away with getting Motrin.  Today he reports he is having an acute flare left ankle.  This flare has been present for 5 days.  Reports redness warmth, swelling, and pain.  Pain is worse with palpation as well as ambulation.  Reports that it feels like "my typical gout flare".  He is still drinking alcohol every night almost one-point pint of whiskey.  He is trying to get to a place where he can quit drinking.   ROS: See pertinent positives and negatives per HPI.  Past Medical History:  Diagnosis Date  . Blood in stool    bright red blood   . Chicken pox   . Diabetes mellitus ([place])   . Erectile dysfunction   . Gout   . Hypertension     Past Surgical History:  Procedure Laterality Date  . APPENDECTOMY  2004    Family History  Problem Relation Age of Onset  . Alcohol abuse Father   . Hypertension Father   . Heart disease Father   . Breast cancer Mother   . Hypertension Mother   . Colon cancer Neg Hx   . Esophageal cancer Neg Hx   . Rectal cancer Neg Hx   . Stomach cancer Neg Hx      Current Outpatient Medications:  .  amLODipine (NORVASC) 10 MG tablet, TAKE 1 TABLET BY  MOUTH EVERY DAY, Disp: 90 tablet, Rfl: 2 .  blood glucose meter kit and supplies, Dispense based on patient and insurance preference. Use up to four times daily as directed. (FOR ICD-9 250.00, 250.01)., Disp: 1 each, Rfl: 0 .  BYSTOLIC 10 MG tablet, TAKE 1 TABLET BY MOUTH EVERY DAY, Disp: 90 tablet, Rfl: 2 .  colchicine 0.6 MG tablet, Take 1.2 mg by mouth 2 (two) times daily as needed (gout flare ups). , Disp: , Rfl: 0 .  Dulaglutide (TRULICITY) 1.5 JK/9.3OI SOPN, Inject 1.5 mg into the skin once a week., Disp: 12 pen, Rfl: 3 .  indomethacin (INDOCIN) 50 MG capsule, Take 1 capsule by mouth 3 times a day., Disp: 30 capsule, Rfl: 0 .  indomethacin (INDOCIN) 50 MG capsule, Take 1 capsule (50 mg total) by mouth 3 (three) times daily with meals., Disp: 90 capsule, Rfl: 0 .  Insulin Glargine (LANTUS SOLOSTAR) 100 UNIT/ML Solostar Pen, INJECT 30 UNITS  SUBCUTANEOUSLY DAILY  AT 10 PM., Disp: 30 mL, Rfl: 3 .  Insulin Pen Needle (PEN NEEDLES 3/16") 31G X 5 MM MISC, 6-40 Units by Does not apply route 4 (four) times daily - after meals and at bedtime., Disp: 100 each, Rfl: 6 .  Lancets (ONETOUCH ULTRASOFT) lancets, Test blood sugars as directed. Dx E11.9, Disp: 100 each, Rfl:  12 .  Multiple Vitamins-Minerals (MEGA MULTIVITAMIN FOR MEN) TABS, Take 2 tablets by mouth daily., Disp: , Rfl:  .  omeprazole (PRILOSEC) 40 MG capsule, Take 1 capsule (40 mg total) by mouth daily. Take 40 mg daily for 8 weeks., Disp: 30 capsule, Rfl: 1 .  ONE TOUCH ULTRA TEST test strip, USE UP TO 4 TIMES DAILY AS DIRECTED, Disp: 100 each, Rfl: 3 .  sildenafil (REVATIO) 20 MG tablet, TAKE 2-5 TABLETS BY MOUTH AS NEEDED FOR SEXUAL ACTIVITY (Patient taking differently: Take 40-100 mg by mouth as needed (sexual activity). ), Disp: 50 tablet, Rfl: 3  EXAM:  VITALS per patient if applicable:  GENERAL: alert, oriented, appears well and in no acute distress  HEENT: atraumatic, conjunttiva clear, no obvious abnormalities on inspection of  external nose and ears  NECK: normal movements of the head and neck  LUNGS: on inspection no signs of respiratory distress, breathing rate appears normal, no obvious gross SOB, gasping or wheezing  CV: no obvious cyanosis  MS: moves all visible extremities without noticeable abnormality.  Redness and swelling noted to ankle.  PSYCH/NEURO: pleasant and cooperative, no obvious depression or anxiety, speech and thought processing grossly intact  ASSESSMENT AND PLAN:  Discussed the following assessment and plan:  Advised that alcohol is likely the contributing factor to his recent gout flares.  He was encouraged to cut back on alcohol consumption. Will prescribe indomethacin 50 mg TID until acute flare has resolved.   Return precautions reviewed   Other secondary acute gout of left ankle - Plan: indomethacin (INDOCIN) 50 MG capsule   I discussed the assessment and treatment plan with the patient. The patient was provided an opportunity to ask questions and all were answered. The patient agreed with the plan and demonstrated an understanding of the instructions.   The patient was advised to call back or seek an in-person evaluation if the symptoms worsen or if the condition fails to improve as anticipated.   Dorothyann Peng, NP

## 2018-11-12 ENCOUNTER — Other Ambulatory Visit: Payer: Self-pay | Admitting: Adult Health

## 2018-11-12 DIAGNOSIS — M10472 Other secondary gout, left ankle and foot: Secondary | ICD-10-CM

## 2018-11-12 NOTE — Telephone Encounter (Signed)
DENIED.  MESSAGE SENT TO THE PHARMACY THAT THE PT SHOULD CONTACT THE PROVIDER FOR FURTHER REFILLS.  PRESCRIBED ONCE FOR GOUT FLARE.  WILL NEED TO FIND OUT IF PT CONTINUES TO HAVE FLARE.

## 2018-12-08 ENCOUNTER — Other Ambulatory Visit: Payer: Self-pay | Admitting: Adult Health

## 2018-12-08 DIAGNOSIS — M10472 Other secondary gout, left ankle and foot: Secondary | ICD-10-CM

## 2018-12-10 NOTE — Telephone Encounter (Signed)
Denied.  Medication was prescribed for gout flare.  Flare should now be resolved.

## 2018-12-16 ENCOUNTER — Telehealth: Payer: Self-pay | Admitting: Adult Health

## 2018-12-16 ENCOUNTER — Other Ambulatory Visit: Payer: Self-pay | Admitting: Emergency Medicine

## 2018-12-16 DIAGNOSIS — M10472 Other secondary gout, left ankle and foot: Secondary | ICD-10-CM

## 2018-12-16 MED ORDER — OMEPRAZOLE 40 MG PO CPDR: 40.0000 mg | DELAYED_RELEASE_CAPSULE | Freq: Every day | ORAL | 2 refills | Status: DC

## 2018-12-16 NOTE — Telephone Encounter (Signed)
Medication: indomethacin (INDOCIN) 50 MG capsule [[id number]]   Has the patient contacted their pharmacy? Yes  (Agent: If no, request that the patient contact the pharmacy for the refill.) (Agent: If yes, when and what did the pharmacy advise?)  Preferred Pharmacy (with phone number or street name): [id number] [place], [place] [address] [place] [address] Phone: [phone] Fax: [phone]    Agent: Please be advised that RX refills may take up to 3 business days. We ask that you follow-up with your pharmacy.

## 2018-12-17 MED ORDER — INDOMETHACIN 50 MG PO CAPS: 50.0000 mg | ORAL_CAPSULE | Freq: Three times a day (TID) | ORAL | 0 refills | Status: DC

## 2019-01-11 ENCOUNTER — Other Ambulatory Visit: Payer: Self-pay | Admitting: Adult Health

## 2019-01-11 DIAGNOSIS — M10472 Other secondary gout, left ankle and foot: Secondary | ICD-10-CM

## 2019-01-13 NOTE — Telephone Encounter (Signed)
Is he still having gout flares?

## 2019-01-13 NOTE — Telephone Encounter (Signed)
Spoke to the pt.  He is no longer having a gout flare.  Medication not needed.  Message sent to the pharmacy.  Nothing further needed.

## 2019-03-04 ENCOUNTER — Ambulatory Visit: Payer: Managed Care, Other (non HMO) | Admitting: Internal Medicine

## 2019-03-15 ENCOUNTER — Telehealth: Payer: Self-pay | Admitting: Adult Health

## 2019-03-16 ENCOUNTER — Other Ambulatory Visit: Payer: Self-pay | Admitting: Adult Health

## 2019-03-16 DIAGNOSIS — M10472 Other secondary gout, left ankle and foot: Secondary | ICD-10-CM

## 2019-03-16 NOTE — Telephone Encounter (Signed)
Medication Refill - Medication: indomethacin (INDOCIN) 50 MG capsule  Has the patient contacted their pharmacy? No (Agent: If no, request that the patient contact the pharmacy for the refill.) (Agent: If yes, when and what did the pharmacy advise?)  Preferred Pharmacy (with phone number or street name):  [id number] [place], [place] [phone] (Phone) [phone] (Fax)   Agent: Please be advised that RX refills may take up to 3 business days. We ask that you follow-up with your pharmacy.

## 2019-03-16 NOTE — Telephone Encounter (Signed)
Requested medication (s) are due for refill today: yes  Requested medication (s) are on the active medication list: yes  Last refill:  12/16/2018  Future visit scheduled: yes  Notes to clinic: review for refill   Requested Prescriptions  Pending Prescriptions Disp Refills   indomethacin (INDOCIN) 50 MG capsule 90 capsule 0    Sig: Take 1 capsule (50 mg total) by mouth 3 (three) times daily with meals.     Analgesics:  NSAIDS Failed - 03/16/2019  9:35 AM      Failed - Cr in normal range and within 360 days    Creatinine, Ser  Date Value Ref Range Status  06/18/2018 1.32 (H) 0.61 - 1.24 mg/dL Final         Passed - HGB in normal range and within 360 days    Hemoglobin  Date Value Ref Range Status  06/18/2018 13.7 13.0 - 17.0 g/dL Final         Passed - Patient is not pregnant      Passed - Valid encounter within last 12 months    Recent Outpatient Visits          4 months ago Other secondary acute gout of left ankle   Therapist, music at [facility], [place], NP   11 months ago Routine general medical examination at a health care facility   [facility] at [place], NP   1 year ago Gastrointestinal hemorrhage, unspecified gastrointestinal hemorrhage type   Therapist, music at [facility], [place], NP   1 year ago Routine general medical examination at a health care facility   [facility] at [facility], [place], NP   2 years ago Diabetic ketoacidosis without coma associated with type 2 diabetes mellitus ([place])   Therapist, music at [facility], [place], NP      Future Appointments            In 1 week Nafziger, [employer], NP [facility] at [place], [facility]

## 2019-03-17 NOTE — Telephone Encounter (Signed)
DENIED.  FILLED FOR 9 MONTHS ON 08/20/2018.  REQUEST IS TOO EARLY.

## 2019-03-17 NOTE — Telephone Encounter (Signed)
Denied.  When I last spoke to the pt he was no longer having a gout flare.  If new flare is started then he needs an appointment with [facility].

## 2019-03-18 NOTE — Telephone Encounter (Signed)
Pt stated the last refill he received was for 90 days and [facility] would not refill medication. Advised that a 9 month supply was sent in on 08/20/18. Pt would like a CB from [facility] to clarify as pt is out of medication. Please advise.

## 2019-03-19 ENCOUNTER — Telehealth: Payer: Self-pay | Admitting: Adult Health

## 2019-03-19 ENCOUNTER — Telehealth: Payer: Self-pay

## 2019-03-19 ENCOUNTER — Other Ambulatory Visit: Payer: Self-pay | Admitting: Family Medicine

## 2019-03-19 ENCOUNTER — Other Ambulatory Visit: Payer: Self-pay | Admitting: Adult Health

## 2019-03-19 MED ORDER — NEBIVOLOL HCL 10 MG PO TABS: 10.0000 mg | ORAL_TABLET | Freq: Every day | ORAL | 0 refills | Status: DC

## 2019-03-19 NOTE — Telephone Encounter (Signed)
Copied from [place] [phone]. Topic: Quick Communication - Rx Refill/Question >> Mar 19, 2019  7:44 AM Carolyn Stare wrote: Medication      BYSTOLIC 10 MG tablet  Pt is out of medication and never picked up  the RX back in Feb said he received the refill from [facility] and it has no refill ,need to pick up at local pharmacy and then 90 day supply sent to [facility]   [facility]    Agent: Please be advised that RX refills may take up to 3 business days. We ask that you follow-up with your pharmacy.

## 2019-03-19 NOTE — Telephone Encounter (Signed)
See telephone encounter from 03/15/2019

## 2019-03-19 NOTE — Addendum Note (Signed)
Addended by: Miles Costain T on: 03/19/2019 08:05 AM   Modules accepted: Orders

## 2019-03-19 NOTE — Telephone Encounter (Signed)
Please advise 

## 2019-03-19 NOTE — Telephone Encounter (Signed)
30 day supply sent to local pharmacy and 90 day supply sent to [facility].  PEC to get pt on schedule for CPX

## 2019-03-19 NOTE — Telephone Encounter (Signed)
Copied from [place] [phone]. Topic: Quick Communication - Rx Refill/Question >> Mar 19, 2019  8:26 AM Leward Quan A wrote: Medication: indomethacin (INDOCIN) 50 MG capsule  Per patient he need this for periodic gout flare ups. Patient is scheduled for a physical on 05/27/2019 first available  Has the patient contacted their pharmacy? Yes.   (Agent: If no, request that the patient contact the pharmacy for the refill.) (Agent: If yes, when and what did the pharmacy advise?)  Preferred Pharmacy (with phone number or street name): [place], [place] [phone] (Phone) [phone] (Fax)    Agent: Please be advised that RX refills may take up to 3 business days. We ask that you follow-up with your pharmacy.

## 2019-03-19 NOTE — Telephone Encounter (Signed)
Requested medication (s) are due for refill today:no  Requested medication (s) are on the active medication list: yes   Future visit scheduled: yes  Notes to clinic:  Patient having a gout flare up. Patient schedule for the first appointment available  Requested Prescriptions  Pending Prescriptions Disp Refills   indomethacin (INDOCIN) 50 MG capsule 30 capsule 0    Sig: Take 1 capsule by mouth 3 times a day.     Analgesics:  NSAIDS Failed - 03/19/2019  8:30 AM      Failed - Cr in normal range and within 360 days    Creatinine, Ser  Date Value Ref Range Status  06/18/2018 1.32 (H) 0.61 - 1.24 mg/dL Final         Passed - HGB in normal range and within 360 days    Hemoglobin  Date Value Ref Range Status  06/18/2018 13.7 13.0 - 17.0 g/dL Final         Passed - Patient is not pregnant      Passed - Valid encounter within last 12 months    Recent Outpatient Visits          5 months ago Other secondary acute gout of left ankle   Therapist, music at [facility], [place], NP   11 months ago Routine general medical examination at a health care facility   [facility] at [place], NP   1 year ago Gastrointestinal hemorrhage, unspecified gastrointestinal hemorrhage type   Therapist, music at [facility], [place], NP   1 year ago Routine general medical examination at a health care facility   [facility], [place], NP   2 years ago Diabetic ketoacidosis without coma associated with type 2 diabetes mellitus ([place])   Therapist, music at [facility], [place], NP      Future Appointments            In 2 months Nafziger, Tommi Rumps, NP [facility] at [place], [facility]

## 2019-03-19 NOTE — Telephone Encounter (Signed)
°  Lvm pt need to schedule a CPE

## 2019-03-23 MED ORDER — INDOMETHACIN 50 MG PO CAPS: ORAL_CAPSULE | ORAL | 0 refills | Status: DC

## 2019-03-25 ENCOUNTER — Ambulatory Visit: Payer: Managed Care, Other (non HMO) | Admitting: Adult Health

## 2019-04-10 ENCOUNTER — Other Ambulatory Visit: Payer: Self-pay | Admitting: Adult Health

## 2019-04-20 ENCOUNTER — Ambulatory Visit: Payer: Managed Care, Other (non HMO) | Admitting: Internal Medicine

## 2019-04-24 ENCOUNTER — Other Ambulatory Visit: Payer: Self-pay | Admitting: Internal Medicine

## 2019-04-28 ENCOUNTER — Other Ambulatory Visit: Payer: Self-pay | Admitting: Internal Medicine

## 2019-05-09 ENCOUNTER — Other Ambulatory Visit: Payer: Self-pay | Admitting: Adult Health

## 2019-05-10 ENCOUNTER — Other Ambulatory Visit: Payer: Self-pay | Admitting: Internal Medicine

## 2019-05-12 NOTE — Telephone Encounter (Signed)
DENIED.  LAST SENT TO THE PHARMACY ON 04/13/2019.  REQUEST IS TOO EARLY.

## 2019-05-25 ENCOUNTER — Other Ambulatory Visit: Payer: Self-pay | Admitting: Adult Health

## 2019-05-26 ENCOUNTER — Other Ambulatory Visit: Payer: Self-pay

## 2019-05-26 NOTE — Telephone Encounter (Signed)
DENIED. FILLED ON 04/13/2019 FOR 90 DAYS.  REFILL REQUEST IS EARLY.

## 2019-05-27 ENCOUNTER — Encounter: Payer: Self-pay | Admitting: Adult Health

## 2019-05-27 ENCOUNTER — Ambulatory Visit (INDEPENDENT_AMBULATORY_CARE_PROVIDER_SITE_OTHER): Payer: Managed Care, Other (non HMO) | Admitting: Adult Health

## 2019-05-27 VITALS — BP 138/86 | Temp 98.3°F | Ht 76.0 in | Wt 275.0 lb

## 2019-05-27 DIAGNOSIS — I10 Essential (primary) hypertension: Secondary | ICD-10-CM | POA: Diagnosis not present

## 2019-05-27 DIAGNOSIS — E781 Pure hyperglyceridemia: Secondary | ICD-10-CM

## 2019-05-27 DIAGNOSIS — F339 Major depressive disorder, recurrent, unspecified: Secondary | ICD-10-CM

## 2019-05-27 DIAGNOSIS — Z125 Encounter for screening for malignant neoplasm of prostate: Secondary | ICD-10-CM

## 2019-05-27 DIAGNOSIS — M10472 Other secondary gout, left ankle and foot: Secondary | ICD-10-CM

## 2019-05-27 DIAGNOSIS — Z0000 Encounter for general adult medical examination without abnormal findings: Secondary | ICD-10-CM

## 2019-05-27 DIAGNOSIS — Z794 Long term (current) use of insulin: Secondary | ICD-10-CM

## 2019-05-27 DIAGNOSIS — E1165 Type 2 diabetes mellitus with hyperglycemia: Secondary | ICD-10-CM

## 2019-05-27 DIAGNOSIS — F1010 Alcohol abuse, uncomplicated: Secondary | ICD-10-CM

## 2019-05-27 MED ORDER — BUPROPION HCL ER (XL) 150 MG PO TB24: 150.0000 mg | ORAL_TABLET | Freq: Every day | ORAL | 1 refills | Status: DC

## 2019-05-27 NOTE — Progress Notes (Signed)
Subjective:    Patient ID: Keith Leblanc, male    DOB: 11/29/1965, 53 y.o.   MRN: [id number]  HPI Patient presents for yearly preventative medicine examination. He is a pleasant 53 year old male who  has a past medical history of Blood in stool, Chicken pox, Diabetes mellitus ([place]), Erectile dysfunction, Gout, and Hypertension.  DM - is managed by Dr. Cruzita Lederer. He is currently managed on Trulicity 1.5 mg and Lantus 30 units in the am. He is checking every other day and reports recent blood sugars in the 170-200 range. He denies symptoms of hypoglycemia. He is taking medications as directed   Lab Results  Component Value Date   HGBA1C 6.0 (A) 09/01/2018   Hypertension - takes norvasc 10 mg daily, bystolic 10 mg daily. He denies dizziness lightheadedness, blurred vision or headaches.   BP Readings from Last 3 Encounters:  05/27/19 138/86  09/01/18 136/82  06/18/18 (!) 162/99   Hyperlipidemia- Not currently taking any medications. Was prescribed lipitor in the past but stopped taking it Lab Results  Component Value Date   CHOL 240 (H) 04/03/2018   HDL 43.00 04/03/2018   LDLCALC 143 (H) 08/27/2016   LDLDIRECT 94.0 04/03/2018   TRIG 1,227.0 (H) 04/03/2018   CHOLHDL 6 04/03/2018   GERD - controlled with omeprazole    H/o gout - Had a recent gout flare and took indomethacin this week which seems to resolved   Depression/ ? Anxiety - This is a new issue but he is unsure of when it really started. Per patient " I will find myself putting on my shoes and getting in the car for no apparent reason besides to just drive around and clear my head and feel better. I do this atleast once a day, every day. I may drive around for 96-75 minutes and then go home". He continues " I just don't feel like myself anymore, I am not happy, I am drinking too much, and I don't even know what I enjoy to do anymore." He denies suicidal ideation.  Alcohol Use - continues to drink about a 5th of liquor a day.  He understands that he needs to cut back.   All immunizations and health maintenance protocols were reviewed with the patient and needed orders were placed.  Appropriate screening laboratory values were ordered for the patient including screening of hyperlipidemia, renal function and hepatic function.  Medication reconciliation,  past medical history, social history, problem list and allergies were reviewed in detail with the patient  Goals were established with regard to weight loss, exercise, and  diet in compliance with medications  Wt Readings from Last 3 Encounters:  05/27/19 275 lb (124.7 kg)  09/01/18 269 lb (122 kg)  06/18/18 274 lb (124.3 kg)   End of life planning was discussed.   Review of Systems  Constitutional: Negative.   HENT: Negative.   Eyes: Negative.   Respiratory: Negative.   Cardiovascular: Negative.   Gastrointestinal: Negative.   Endocrine: Negative.   Genitourinary: Negative.   Musculoskeletal: Negative.   Skin: Negative.   Allergic/Immunologic: Negative.   Neurological: Negative.   Hematological: Negative.   Psychiatric/Behavioral: Positive for dysphoric mood.   Past Medical History:  Diagnosis Date  . Blood in stool    bright red blood   . Chicken pox   . Diabetes mellitus ([place])   . Erectile dysfunction   . Gout   . Hypertension     Social History   Socioeconomic History  . Marital  status: Single    Spouse name: Not on file  . Number of children: Not on file  . Years of education: Not on file  . Highest education level: Not on file  Occupational History  . Not on file  Social Needs  . Financial resource strain: Not on file  . Food insecurity    Worry: Not on file    Inability: Not on file  . Transportation needs    Medical: Not on file    Non-medical: Not on file  Tobacco Use  . Smoking status: Former Research scientist (life sciences)  . Smokeless tobacco: Current User    Types: Chew  Substance and Sexual Activity  . Alcohol use: Yes     Alcohol/week: 4.0 - 5.0 standard drinks    Types: 4 - 5 Cans of beer per week  . Drug use: Not Currently    Types: Marijuana  . Sexual activity: Yes  Lifestyle  . Physical activity    Days per week: Not on file    Minutes per session: Not on file  . Stress: Not on file  Relationships  . Social Herbalist on phone: Not on file    Gets together: Not on file    Attends religious service: Not on file    Active member of club or organization: Not on file    Attends meetings of clubs or organizations: Not on file    Relationship status: Not on file  . Intimate partner violence    Fear of current or ex partner: Not on file    Emotionally abused: Not on file    Physically abused: Not on file    Forced sexual activity: Not on file  Other Topics Concern  . Not on file  Social History Narrative   Works for [employer]    Not married    One daughter who does not live with him    Likes to gamble, walk in the parks, travel.     Past Surgical History:  Procedure Laterality Date  . APPENDECTOMY  2004    Family History  Problem Relation Age of Onset  . Alcohol abuse Father   . Hypertension Father   . Heart disease Father   . Breast cancer Mother   . Hypertension Mother   . Colon cancer Neg Hx   . Esophageal cancer Neg Hx   . Rectal cancer Neg Hx   . Stomach cancer Neg Hx     No Known Allergies  Current Outpatient Medications on File Prior to Visit  Medication Sig Dispense Refill  . amLODipine (NORVASC) 10 MG tablet TAKE 1 TABLET BY MOUTH EVERY DAY 90 tablet 2  . B-D UF III MINI PEN NEEDLES 31G X 5 MM MISC USE 4 TIMES DAILY AFTER MEALS AND AT BEDTIME 100 each 6  . blood glucose meter kit and supplies Dispense based on patient and insurance preference. Use up to four times daily as directed. (FOR ICD-9 250.00, 250.01). 1 each 0  . BYSTOLIC 10 MG tablet TAKE 1 TABLET BY MOUTH EVERY DAY 90 tablet 0  . colchicine 0.6 MG tablet Take 1.2 mg by mouth 2 (two) times daily as needed  (gout flare ups).   0  . indomethacin (INDOCIN) 50 MG capsule Take 1 capsule (50 mg total) by mouth 3 (three) times daily with meals. 90 capsule 0  . indomethacin (INDOCIN) 50 MG capsule Take 1 capsule by mouth 3 times a day. 30 capsule 0  . Insulin Glargine (  LANTUS SOLOSTAR) 100 UNIT/ML Solostar Pen INJECT SUBCUTANEOUSLY 30  UNITS DAILY AT 10PM 30 mL 3  . Lancets (ONETOUCH ULTRASOFT) lancets Test blood sugars as directed. Dx E11.9 100 each 12  . Multiple Vitamins-Minerals (MEGA MULTIVITAMIN FOR MEN) TABS Take 2 tablets by mouth daily.    Marland Kitchen omeprazole (PRILOSEC) 40 MG capsule Take 1 capsule (40 mg total) by mouth daily. Take 40 mg daily 90 capsule 2  . ONE TOUCH ULTRA TEST test strip USE UP TO 4 TIMES DAILY AS DIRECTED 100 each 3  . sildenafil (REVATIO) 20 MG tablet TAKE 2-5 TABLETS BY MOUTH AS NEEDED FOR SEXUAL ACTIVITY (Patient taking differently: Take 40-100 mg by mouth as needed (sexual activity). ) 50 tablet 3  . TRULICITY 1.5 ZO/1.0RU SOPN INJECT THE CONTENTS OF 1  PEN SUBCUTANEOUSLY ONCE A  WEEK 6 mL 3   No current facility-administered medications on file prior to visit.     BP 138/86   Temp 98.3 F (36.8 C) (Temporal)   Ht '6\' 4"'$  (1.93 m)   Wt 275 lb (124.7 kg)   BMI 33.47 kg/m       Objective:   Physical Exam Vitals signs and nursing note reviewed.  Constitutional:      General: He is not in acute distress.    Appearance: Normal appearance. He is well-developed and overweight.  HENT:     Head: Normocephalic and atraumatic.     Right Ear: Tympanic membrane, ear canal and external ear normal. There is no impacted cerumen.     Left Ear: Tympanic membrane, ear canal and external ear normal. There is no impacted cerumen.     Nose: Nose normal. No congestion.     Mouth/Throat:     Mouth: Mucous membranes are moist.     Pharynx: Oropharynx is clear. No oropharyngeal exudate.  Eyes:     General:        Right eye: No discharge.        Left eye: No discharge.      Conjunctiva/sclera: Conjunctivae normal.     Pupils: Pupils are equal, round, and reactive to light.  Neck:     Trachea: No tracheal deviation.  Cardiovascular:     Rate and Rhythm: Normal rate and regular rhythm.     Pulses: Normal pulses.     Heart sounds: Normal heart sounds. No murmur. No friction rub. No gallop.   Pulmonary:     Effort: Pulmonary effort is normal. No respiratory distress.     Breath sounds: Normal breath sounds. No stridor. No wheezing, rhonchi or rales.  Chest:     Chest wall: No tenderness.  Abdominal:     General: Abdomen is flat. Bowel sounds are normal. There is no distension.     Palpations: Abdomen is soft. There is no mass.     Tenderness: There is no abdominal tenderness. There is no right CVA tenderness, left CVA tenderness, guarding or rebound.     Hernia: No hernia is present.  Musculoskeletal: Normal range of motion.        General: No swelling, tenderness, deformity or signs of injury.     Right lower leg: No edema.     Left lower leg: No edema.  Lymphadenopathy:     Cervical: No cervical adenopathy.  Skin:    General: Skin is warm and dry.     Coloration: Skin is not jaundiced or pale.     Findings: No bruising, erythema, lesion or rash.  Neurological:  General: No focal deficit present.     Mental Status: He is alert and oriented to person, place, and time.     Cranial Nerves: No cranial nerve deficit.     Sensory: No sensory deficit.     Motor: No weakness.     Coordination: Coordination normal.     Gait: Gait normal.     Deep Tendon Reflexes: Reflexes normal.  Psychiatric:        Mood and Affect: Mood is depressed. Affect is flat.        Behavior: Behavior is slowed and withdrawn.        Thought Content: Thought content normal.        Judgment: Judgment normal.        Assessment & Plan:  1. Routine general medical examination at a health care facility - Needs to cut back on drinking  - Work on lifestyle modifications  -  Follow up in one year or sooner if needed - CBC with Differential/Platelet - CMP - Lipid panel - TSH  2. Depression, recurrent ([place]) - Likely secondary to alcohol abuse. He is open to starting medication. Will have him follow up in 30 days or sooner if needed. If he has SI then go to the ER - buPROPion (WELLBUTRIN XL) 150 MG 24 hr tablet; Take 1 tablet (150 mg total) by mouth daily.  Dispense: 30 tablet; Refill: 1  3. Essential hypertension - Continue to monitor  - CBC with Differential/Platelet - CMP - Lipid panel - TSH  4. Hypertriglyceridemia - Likley need to add statin or fenofibrate  - CBC with Differential/Platelet - CMP - Lipid panel - TSH  5. Type 2 diabetes mellitus with hyperglycemia, with long-term current use of insulin ([place]) - Has follow up with endocrinology  - CBC with Differential/Platelet - CMP - Lipid panel - TSH  6. Other secondary acute gout of left ankle - Likely due to alcohol abuse  7. Alcohol abuse - He was encouraged to cut back on drinking  - CBC with Differential/Platelet - CMP - Lipid panel - TSH  8. Prostate cancer screening  - PSA   Dorothyann Peng, NP

## 2019-05-28 ENCOUNTER — Ambulatory Visit: Payer: Managed Care, Other (non HMO) | Admitting: Internal Medicine

## 2019-05-28 ENCOUNTER — Other Ambulatory Visit: Payer: Self-pay | Admitting: Family Medicine

## 2019-05-28 LAB — CBC WITH DIFFERENTIAL/PLATELET
Basophils Absolute: 0.1 10*3/uL (ref 0.0–0.1)
Basophils Relative: 0.8 % (ref 0.0–3.0)
Eosinophils Absolute: 0.1 10*3/uL (ref 0.0–0.7)
Eosinophils Relative: 0.9 % (ref 0.0–5.0)
HCT: 38.2 % — ABNORMAL LOW (ref 39.0–52.0)
Hemoglobin: 12.6 g/dL — ABNORMAL LOW (ref 13.0–17.0)
Lymphocytes Relative: 37.7 % (ref 12.0–46.0)
Lymphs Abs: 3.1 10*3/uL (ref 0.7–4.0)
MCHC: 33.1 g/dL (ref 30.0–36.0)
MCV: 96 fl (ref 78.0–100.0)
Monocytes Absolute: 0.5 10*3/uL (ref 0.1–1.0)
Monocytes Relative: 6.2 % (ref 3.0–12.0)
Neutro Abs: 4.4 10*3/uL (ref 1.4–7.7)
Neutrophils Relative %: 54.4 % (ref 43.0–77.0)
Platelets: 413 10*3/uL — ABNORMAL HIGH (ref 150.0–400.0)
RBC: 3.97 Mil/uL — ABNORMAL LOW (ref 4.22–5.81)
RDW: 14.6 % (ref 11.5–15.5)
WBC: 8.2 10*3/uL (ref 4.0–10.5)

## 2019-05-28 LAB — COMPREHENSIVE METABOLIC PANEL
ALT: 22 U/L (ref 0–53)
AST: 18 U/L (ref 0–37)
Albumin: 4.4 g/dL (ref 3.5–5.2)
Alkaline Phosphatase: 49 U/L (ref 39–117)
BUN: 17 mg/dL (ref 6–23)
CO2: 29 mEq/L (ref 19–32)
Calcium: 9.9 mg/dL (ref 8.4–10.5)
Chloride: 100 mEq/L (ref 96–112)
Creatinine, Ser: 1.25 mg/dL (ref 0.40–1.50)
GFR: 73.59 mL/min (ref >60.00)
Glucose, Bld: 106 mg/dL — ABNORMAL HIGH (ref 70–99)
Potassium: 3.9 mEq/L (ref 3.5–5.1)
Sodium: 140 mEq/L (ref 135–145)
Total Bilirubin: 0.4 mg/dL (ref 0.2–1.2)
Total Protein: 7.6 g/dL (ref 6.0–8.3)

## 2019-05-28 LAB — LIPID PANEL
Cholesterol: 229 mg/dL — ABNORMAL HIGH (ref 0–200)
HDL: 55.3 mg/dL (ref >39.00)
Total CHOL/HDL Ratio: 4
Triglycerides: 481 mg/dL — ABNORMAL HIGH (ref 0.0–149.0)

## 2019-05-28 LAB — TSH: TSH: 2.04 u[IU]/mL (ref 0.35–4.50)

## 2019-05-28 LAB — LDL CHOLESTEROL, DIRECT: Direct LDL: 106 mg/dL

## 2019-05-28 LAB — PSA: PSA: 0.44 ng/mL (ref 0.10–4.00)

## 2019-05-28 MED ORDER — ATORVASTATIN CALCIUM 20 MG PO TABS: 20.0000 mg | ORAL_TABLET | Freq: Every day | ORAL | 3 refills | Status: DC

## 2019-05-29 ENCOUNTER — Other Ambulatory Visit: Payer: Self-pay | Admitting: Adult Health

## 2019-05-29 DIAGNOSIS — M10472 Other secondary gout, left ankle and foot: Secondary | ICD-10-CM

## 2019-06-01 ENCOUNTER — Other Ambulatory Visit: Payer: Self-pay | Admitting: Adult Health

## 2019-06-01 NOTE — Telephone Encounter (Signed)
Requested medication (s) are due for refill today: yes  Requested medication (s) are on the active medication list: yes  Last refill:  12/17/2018  Future visit scheduled: yes  Notes to clinic:  Review for refill    Requested Prescriptions  Pending Prescriptions Disp Refills   indomethacin (INDOCIN) 50 MG capsule [Pharmacy Med Name: INDOMETHACIN 50 MG CAPSULE] 90 capsule 0    Sig: Take 1 capsule (50 mg total) by mouth 3 (three) times daily with meals.     Analgesics:  NSAIDS Failed - 06/01/2019  9:21 AM      Failed - HGB in normal range and within 360 days    Hemoglobin  Date Value Ref Range Status  05/27/2019 12.6 (L) 13.0 - 17.0 g/dL Final         Passed - Cr in normal range and within 360 days    Creatinine, Ser  Date Value Ref Range Status  05/27/2019 1.25 0.40 - 1.50 mg/dL Final         Passed - Patient is not pregnant      Passed - Valid encounter within last 12 months    Recent Outpatient Visits          5 days ago Routine general medical examination at a health care facility   [facility] [place], [place], NP   7 months ago Other secondary acute gout of left ankle   [facility], [place], NP   1 year ago Routine general medical examination at a health care facility   [facility] at [place], NP   1 year ago Gastrointestinal hemorrhage, unspecified gastrointestinal hemorrhage type   [facility], [place], NP   2 years ago Routine general medical examination at a health care facility   [facility], [place], [place]            In 4 weeks Nafziger, Tommi Rumps, NP [facility] at [place], [facility]

## 2019-06-01 NOTE — Telephone Encounter (Signed)
Please see patient request.

## 2019-06-01 NOTE — Telephone Encounter (Signed)
Patient called to see if there is anyway his medication request can be expeditied

## 2019-06-02 NOTE — Telephone Encounter (Signed)
DENIED.  REFILL SENT TO PHARMACY ON 06/01/2019.

## 2019-06-18 ENCOUNTER — Other Ambulatory Visit: Payer: Self-pay | Admitting: Adult Health

## 2019-06-18 DIAGNOSIS — F339 Major depressive disorder, recurrent, unspecified: Secondary | ICD-10-CM

## 2019-06-22 ENCOUNTER — Ambulatory Visit: Payer: Managed Care, Other (non HMO) | Admitting: Internal Medicine

## 2019-06-28 ENCOUNTER — Other Ambulatory Visit: Payer: Self-pay | Admitting: Adult Health

## 2019-06-28 DIAGNOSIS — M10472 Other secondary gout, left ankle and foot: Secondary | ICD-10-CM

## 2019-06-29 ENCOUNTER — Telehealth (INDEPENDENT_AMBULATORY_CARE_PROVIDER_SITE_OTHER): Payer: Managed Care, Other (non HMO) | Admitting: Adult Health

## 2019-06-29 ENCOUNTER — Encounter: Payer: Self-pay | Admitting: Adult Health

## 2019-06-29 ENCOUNTER — Other Ambulatory Visit: Payer: Self-pay

## 2019-06-29 DIAGNOSIS — F339 Major depressive disorder, recurrent, unspecified: Secondary | ICD-10-CM | POA: Diagnosis not present

## 2019-06-29 MED ORDER — BUPROPION HCL ER (XL) 300 MG PO TB24: 300.0000 mg | ORAL_TABLET | Freq: Every day | ORAL | 1 refills | Status: DC

## 2019-06-29 NOTE — Telephone Encounter (Signed)
Okay for refill?  

## 2019-06-29 NOTE — Progress Notes (Signed)
Virtual Visit via Video Note  I connected with Keith Leblanc on 06/29/19 at  2:00 PM EST by a video enabled telemedicine application and verified that I am speaking with the correct person using two identifiers.  Location patient: home Location provider:work or home office Persons participating in the virtual visit: patient, provider  I discussed the limitations of evaluation and management by telemedicine and the availability of in person appointments. The patient expressed understanding and agreed to proceed.   HPI: 54 year old male who is being evaluated today for 1 month follow-up regarding depression.  During his last visit he was reporting that he was finding himself going on car rides for no apparent reason but to clear his head.  He reported that he did not feel like himself anymore and was not happy.  He was drinking too much.  He was started on Wellbutrin 150 mg extended release.  Today he reports that he has not noticed any significant difference in starting Wellbutrin.  He continues to go on car rides to clear his head and does not feel happy.  He was doing well for 2 weeks after last visit on quitting drinking.  Unfortunately Christmas came in he started drinking a pint of liquor a day again.  He reports that during those 2 weeks he would drive past his neighborhood liquor store and not stop.  By the time he got home he had 45 minutes until the liquor store would close that he would keep himself busy.  He does not keep liquor at home.   ROS: See pertinent positives and negatives per HPI.  Past Medical History:  Diagnosis Date  . Blood in stool    bright red blood   . Chicken pox   . Diabetes mellitus ([place])   . Erectile dysfunction   . Gout   . Hypertension     Past Surgical History:  Procedure Laterality Date  . APPENDECTOMY  2004    Family History  Problem Relation Age of Onset  . Alcohol abuse Father   . Hypertension Father   . Heart disease Father   . Breast  cancer Mother   . Hypertension Mother   . Colon cancer Neg Hx   . Esophageal cancer Neg Hx   . Rectal cancer Neg Hx   . Stomach cancer Neg Hx      Current Outpatient Medications:  .  amLODipine (NORVASC) 10 MG tablet, TAKE 1 TABLET BY MOUTH EVERY DAY, Disp: 90 tablet, Rfl: 2 .  atorvastatin (LIPITOR) 20 MG tablet, Take 1 tablet (20 mg total) by mouth daily., Disp: 90 tablet, Rfl: 3 .  B-D UF III MINI PEN NEEDLES 31G X 5 MM MISC, USE 4 TIMES DAILY AFTER MEALS AND AT BEDTIME, Disp: 100 each, Rfl: 6 .  blood glucose meter kit and supplies, Dispense based on patient and insurance preference. Use up to four times daily as directed. (FOR ICD-9 250.00, 250.01)., Disp: 1 each, Rfl: 0 .  buPROPion (WELLBUTRIN XL) 150 MG 24 hr tablet, TAKE 1 TABLET BY MOUTH EVERY DAY, Disp: 90 tablet, Rfl: 0 .  BYSTOLIC 10 MG tablet, TAKE 1 TABLET BY MOUTH EVERY DAY, Disp: 90 tablet, Rfl: 0 .  colchicine 0.6 MG tablet, Take 1.2 mg by mouth 2 (two) times daily as needed (gout flare ups). , Disp: , Rfl: 0 .  indomethacin (INDOCIN) 50 MG capsule, Take 1 capsule by mouth 3 times a day., Disp: 30 capsule, Rfl: 0 .  indomethacin (INDOCIN) 50 MG capsule,  TAKE 1 CAPSULE (50 MG TOTAL) BY MOUTH 3 (THREE) TIMES DAILY WITH MEALS., Disp: 90 capsule, Rfl: 0 .  Insulin Glargine (LANTUS SOLOSTAR) 100 UNIT/ML Solostar Pen, INJECT SUBCUTANEOUSLY 30  UNITS DAILY AT 10PM, Disp: 30 mL, Rfl: 3 .  Lancets (ONETOUCH ULTRASOFT) lancets, Test blood sugars as directed. Dx E11.9, Disp: 100 each, Rfl: 12 .  Multiple Vitamins-Minerals (MEGA MULTIVITAMIN FOR MEN) TABS, Take 2 tablets by mouth daily., Disp: , Rfl:  .  omeprazole (PRILOSEC) 40 MG capsule, Take 1 capsule (40 mg total) by mouth daily. Take 40 mg daily, Disp: 90 capsule, Rfl: 2 .  ONE TOUCH ULTRA TEST test strip, USE UP TO 4 TIMES DAILY AS DIRECTED, Disp: 100 each, Rfl: 3 .  sildenafil (REVATIO) 20 MG tablet, TAKE 2-5 TABLETS BY MOUTH AS NEEDED FOR SEXUAL ACTIVITY (Patient taking  differently: Take 40-100 mg by mouth as needed (sexual activity). ), Disp: 50 tablet, Rfl: 3 .  TRULICITY 1.5 PZ/9.6UG SOPN, INJECT THE CONTENTS OF 1  PEN SUBCUTANEOUSLY ONCE A  WEEK, Disp: 6 mL, Rfl: 3  EXAM:  VITALS per patient if applicable:  GENERAL: alert, oriented, appears well and in no acute distress  HEENT: atraumatic, conjunttiva clear, no obvious abnormalities on inspection of external nose and ears  NECK: normal movements of the head and neck  LUNGS: on inspection no signs of respiratory distress, breathing rate appears normal, no obvious gross SOB, gasping or wheezing  CV: no obvious cyanosis  MS: moves all visible extremities without noticeable abnormality  PSYCH/NEURO: pleasant and cooperative, no obvious depression or anxiety, speech and thought processing grossly intact  ASSESSMENT AND PLAN:  Discussed the following assessment and plan:  1. Depression, recurrent ([place]) -We will trial increasing Wellbutrin to 300 mg extended release.  Ultimately he needs to quit drinking.  We will have him follow-up in 30 days or sooner if needed. - buPROPion (WELLBUTRIN XL) 300 MG 24 hr tablet; Take 1 tablet (300 mg total) by mouth daily.  Dispense: 30 tablet; Refill: 1     I discussed the assessment and treatment plan with the patient. The patient was provided an opportunity to ask questions and all were answered. The patient agreed with the plan and demonstrated an understanding of the instructions.   The patient was advised to call back or seek an in-person evaluation if the symptoms worsen or if the condition fails to improve as anticipated.   Dorothyann Peng, NP

## 2019-06-29 NOTE — Patient Instructions (Signed)
Health Maintenance Due  Topic Date Due  . FOOT EXAM  02/04/1978  . OPHTHALMOLOGY EXAM  02/04/1978  . URINE MICROALBUMIN  02/04/1978  . HIV Screening  02/05/1983  . HEMOGLOBIN A1C  03/04/2019    Depression screen [facility] 2/9 06/29/2019 06/29/2019 08/06/2016  Decreased Interest 1 1 0  Down, Depressed, Hopeless 0 0 0  PHQ - 2 Score 1 1 0  Altered sleeping 3 - -  Tired, decreased energy 0 - -  Change in appetite 0 - -  Feeling bad or failure about yourself  0 - -  Trouble concentrating 0 - -  Moving slowly or fidgety/restless 3 - -  Suicidal thoughts 0 - -  PHQ-9 Score 7 - -  Difficult doing work/chores Not difficult at all - -

## 2019-07-17 ENCOUNTER — Other Ambulatory Visit: Payer: Self-pay | Admitting: Adult Health

## 2019-07-19 ENCOUNTER — Telehealth: Payer: Self-pay | Admitting: Adult Health

## 2019-07-19 NOTE — Telephone Encounter (Signed)
BYSTOLIC 10 MG table  Pt is requesting a refill request  [id number] - [place], [place] (Ph: [phone]

## 2019-07-20 ENCOUNTER — Other Ambulatory Visit: Payer: Self-pay

## 2019-07-20 MED ORDER — NEBIVOLOL HCL 10 MG PO TABS: 10.0000 mg | ORAL_TABLET | Freq: Every day | ORAL | 0 refills | Status: DC

## 2019-07-20 NOTE — Telephone Encounter (Signed)
Rx sent to the pharmacy.

## 2019-07-21 ENCOUNTER — Other Ambulatory Visit: Payer: Self-pay | Admitting: Adult Health

## 2019-07-25 ENCOUNTER — Other Ambulatory Visit: Payer: Self-pay | Admitting: Adult Health

## 2019-07-25 DIAGNOSIS — F339 Major depressive disorder, recurrent, unspecified: Secondary | ICD-10-CM

## 2019-08-16 ENCOUNTER — Other Ambulatory Visit: Payer: Self-pay | Admitting: Adult Health

## 2019-08-16 DIAGNOSIS — M10472 Other secondary gout, left ankle and foot: Secondary | ICD-10-CM

## 2019-08-18 ENCOUNTER — Telehealth: Payer: Self-pay | Admitting: Adult Health

## 2019-08-18 NOTE — Telephone Encounter (Signed)
How many gout flares has he had recently. He seems to be going through Indomethacin quickly which can cause ulcers and other GI issues as well as bleeding

## 2019-08-18 NOTE — Telephone Encounter (Signed)
Pt has been scheduled on 08/19/2019.  Nothing further needed.

## 2019-08-18 NOTE — Telephone Encounter (Signed)
Left a message for the pt to call back and schedule appointment.  Can do virtual appointment or telephone call if the pt is unable to do virtual.

## 2019-08-18 NOTE — Telephone Encounter (Signed)
Pt is scheduled for virtual visit on 08/19/2019.  Please hold request and discuss during visit.

## 2019-08-18 NOTE — Telephone Encounter (Signed)
Pt has pulled a muscle from his back on Sunday and it's very painful. Pt is asking for a muscle relaxer because he is in a lot of pain. [facility] [address]([place] Thanks

## 2019-08-19 ENCOUNTER — Other Ambulatory Visit: Payer: Self-pay

## 2019-08-19 ENCOUNTER — Telehealth (INDEPENDENT_AMBULATORY_CARE_PROVIDER_SITE_OTHER): Payer: Managed Care, Other (non HMO) | Admitting: Adult Health

## 2019-08-19 DIAGNOSIS — T148XXA Other injury of unspecified body region, initial encounter: Secondary | ICD-10-CM | POA: Diagnosis not present

## 2019-08-19 DIAGNOSIS — M1A49X0 Other secondary chronic gout, multiple sites, without tophus (tophi): Secondary | ICD-10-CM

## 2019-08-19 MED ORDER — ALLOPURINOL 100 MG PO TABS: 100.0000 mg | ORAL_TABLET | Freq: Every day | ORAL | 1 refills | Status: DC

## 2019-08-19 MED ORDER — CYCLOBENZAPRINE HCL 10 MG PO TABS: 10.0000 mg | ORAL_TABLET | Freq: Three times a day (TID) | ORAL | 0 refills | Status: DC | PRN

## 2019-08-19 MED ORDER — INDOMETHACIN 50 MG PO CAPS: 50.0000 mg | ORAL_CAPSULE | Freq: Three times a day (TID) | ORAL | 1 refills | Status: DC | PRN

## 2019-08-19 NOTE — Progress Notes (Signed)
Virtual Visit via Telephone Note  I connected with Keith Leblanc on 08/19/19 at  1:00 PM EST by telephone and verified that I am speaking with the correct person using two identifiers.   I discussed the limitations, risks, security and privacy concerns of performing an evaluation and management service by telephone and the availability of in person appointments. I also discussed with the patient that there may be a patient responsible charge related to this service. The patient expressed understanding and agreed to proceed.  Location patient: home Location provider: work or home office Participants present for the call: patient, provider Patient did not have a visit in the prior 7 days to address this/these issue(s).   History of Present Illness: 54 year old male who is being evaluated today for recurrent gout flares.  I had received refill request for indomethacin.  2 months ago I sent in 90 pills for him.  He reports that he has had multiple gout flares over the last month.  Currently has 1 in his right knee but it is improving.  Reports that he ate a shrimp salad and this is what he believes caused the gout flare.  He does have a problem with alcohol consumption but does report that he is cut back drastically on the amount of liquor that he drinks a day.  Additionally he reports that he has a muscle strain in his upper back.  Denies any trauma or aggravating injury believes that 4 nights ago "I fell asleep in the chair and when I woke up I had stiffness in my back".  He has tried over-the-counter Tylenol and Aleve which provided no benefit heating pad provides some mild relief.  His pain is present with all types of movement and with rest   Observations/Objective: Patient sounds cheerful and well on the phone. I do not appreciate any SOB. Speech and thought processing are grossly intact. Patient reported vitals:  Assessment and Plan: 1. Other secondary chronic gout of multiple sites  without tophus -He has been taking a lot of indomethacin, he was warned on the dangers of taking chronic anti-inflammatories.  We will try him on allopurinol 100 mg daily.  Encouraged to refrain from triggers including shellfish and alcohol.  Follow-up as needed - allopurinol (ZYLOPRIM) 100 MG tablet; Take 1 tablet (100 mg total) by mouth daily.  Dispense: 90 tablet; Refill: 1 - indomethacin (INDOCIN) 50 MG capsule; Take 1 capsule (50 mg total) by mouth 3 (three) times daily as needed.  Dispense: 30 capsule; Refill: 1  2. Muscle strain -Continue with warm compress.  Can take Flexeril 3 times a day as needed.  This medication may make him sleepy - cyclobenzaprine (FLEXERIL) 10 MG tablet; Take 1 tablet (10 mg total) by mouth 3 (three) times daily as needed for muscle spasms.  Dispense: 15 tablet; Refill: 0   Follow Up Instructions:  I did not refer this patient for an OV in the next 24 hours for this/these issue(s).  I discussed the assessment and treatment plan with the patient. The patient was provided an opportunity to ask questions and all were answered. The patient agreed with the plan and demonstrated an understanding of the instructions.   The patient was advised to call back or seek an in-person evaluation if the symptoms worsen or if the condition fails to improve as anticipated.  I provided 25 minutes of non-face-to-face time during this encounter.   Dorothyann Peng, NP

## 2019-08-20 ENCOUNTER — Telehealth: Payer: Self-pay | Admitting: Adult Health

## 2019-08-20 ENCOUNTER — Other Ambulatory Visit: Payer: Self-pay | Admitting: Adult Health

## 2019-08-20 MED ORDER — METHYLPREDNISOLONE 4 MG PO TBPK: ORAL_TABLET | ORAL | 0 refills | Status: DC

## 2019-08-20 NOTE — Telephone Encounter (Signed)
What have his blood sugars been at home?

## 2019-08-20 NOTE — Telephone Encounter (Signed)
Pt call and stated he had a vir will cory yesterday and was given some muscle relaxer's and the pain is worse and want to know what to do.he stated that he have taken 6 pill .want a call back

## 2019-08-20 NOTE — Telephone Encounter (Signed)
Spoke to the pt and he states bg has been running 135-140s.

## 2019-08-20 NOTE — Telephone Encounter (Signed)
Drawl Dosepak has been sent to his local pharmacy.  Please advise the patient needs to consume a lot more water while taking prednisone to help keep his blood sugars down.  If he does not have any improvement by early next week please have him follow-up in the office

## 2019-08-20 NOTE — Telephone Encounter (Signed)
Spoke to the pt and advised of below message.  Pt prompted for any questions that he may have.  He agreed to follow up in the office next week if not better.  Nothing further needed.

## 2019-08-23 ENCOUNTER — Other Ambulatory Visit: Payer: Self-pay | Admitting: Internal Medicine

## 2019-08-23 NOTE — Telephone Encounter (Signed)
Last office visit 09/01/2018  Cancel/No-show? 4  Future office visit scheduled? no  Please advise on refill.

## 2019-08-23 NOTE — Telephone Encounter (Signed)
This patient was lost for follow-up for me.  Refills per PCP.

## 2019-08-26 ENCOUNTER — Other Ambulatory Visit: Payer: Self-pay

## 2019-08-26 ENCOUNTER — Other Ambulatory Visit: Payer: Self-pay | Admitting: Internal Medicine

## 2019-08-31 ENCOUNTER — Other Ambulatory Visit: Payer: Self-pay

## 2019-08-31 ENCOUNTER — Ambulatory Visit: Payer: Managed Care, Other (non HMO) | Admitting: Internal Medicine

## 2019-08-31 ENCOUNTER — Encounter: Payer: Self-pay | Admitting: Internal Medicine

## 2019-08-31 VITALS — BP 130/90 | HR 80 | Ht 76.0 in | Wt 272.0 lb

## 2019-08-31 DIAGNOSIS — E781 Pure hyperglyceridemia: Secondary | ICD-10-CM | POA: Diagnosis not present

## 2019-08-31 DIAGNOSIS — E1165 Type 2 diabetes mellitus with hyperglycemia: Secondary | ICD-10-CM | POA: Diagnosis not present

## 2019-08-31 DIAGNOSIS — Z6832 Body mass index (BMI) 32.0-32.9, adult: Secondary | ICD-10-CM

## 2019-08-31 DIAGNOSIS — Z794 Long term (current) use of insulin: Secondary | ICD-10-CM | POA: Diagnosis not present

## 2019-08-31 DIAGNOSIS — E661 Drug-induced obesity: Secondary | ICD-10-CM | POA: Diagnosis not present

## 2019-08-31 LAB — POCT GLYCOSYLATED HEMOGLOBIN (HGB A1C): Hemoglobin A1C: 6.5 % — AB (ref 4.0–5.6)

## 2019-08-31 NOTE — Progress Notes (Signed)
Patient ID: Keith Leblanc, male   DOB: 1966-03-31, 54 y.o.   MRN: [id number]   This visit occurred during the SARS-CoV-2 public health emergency.  Safety protocols were in place, including screening questions prior to the visit, additional usage of staff PPE, and extensive cleaning of exam room while observing appropriate contact time as indicated for disinfecting solutions.   HPI: Keith Leblanc is a 54 y.o.-year-old male, returning for f/u for  DM2, dx in 06/2016 (prev. Prediabetes since 2015-16), insulin-dependent, uncontrolled, without long term complications. Last visit 1 year ago.  Reviewed history: Patient was diagnosed with diabetes in 07/07/2016, when he presented to the hospital in DKA. An A1c was 12%. His CBG was 1081. Retrospectively, he had increased urination, increased thirst, blurred vision, fatigue, rapid weight loss of 25 lbs. He had a lot of stress at work + drinking A LOT of sweet drinks (4 cans of soda a day + gatorade + juice). He is now off these but still working on decreasing Alcohol - 4-5 beers a day.  He was started on insulin inhouse, and his CBG is greatly improved afterwards. Less than a month later, his HbA1c decreased to 9.6%.  Reviewed HbA1c levels: Lab Results  Component Value Date   HGBA1C 6.0 (A) 09/01/2018   HGBA1C 6.7 (A) 05/04/2018   HGBA1C 5.6 01/21/2017   Pt was on a regimen of: - Metformin 500 mg 0-1x a day with dinner >> could not tolerate this 2/2 nausea - Basaglar 40 units at bedtime - takes 2-3x a week - Novolog 6 units 3x a day, before meals >> stopped 96/2952  Now on: - Trulicity 8.41 >> 1.5 mg weekly - Lantus 30 >> 24 units daily in am  Pt checks his sugars once a day: - am:  115-135, 199 >> 95-137 >> 120-145 >> 160-165 - 2h after b'fast: 107-122 >> n/c - before lunch: 97-134 >> ? >> n/c >> 120-145 >> n/c - 2h after lunch: n/c - before dinner: 94, 126 >> ? >> n/c >> 140-175 - 2h after dinner: n/c - bedtime: n/c >> 89-100 >> n/c -  nighttime: n/c Lowest sugar was 93 >> 89 >> 120; it is unclear at which level he has hypoglycemia awareness. Highest sugar was 226 (alcohol) >> 200 >> 199 >> 155 >> 400s x1, 190 >> 180. He was in the ED 05/2018 for hyperglycemia (CBG 400s >> ED: 361). He was having blurry vision then.  Glucometer: One Touch Ultra  Pt's meals are: - Breakfast: 2-3 boiled eggs, sausage, cheese, diet tea - Lunch: salad, chicken or pork, coke zero - Dinner: sea food + veggies + brown rice, coke zero - Snacks: water  -No history of CKD: Lab Results  Component Value Date   BUN 17 05/27/2019   BUN 20 06/18/2018   CREATININE 1.25 05/27/2019   CREATININE 1.32 (H) 06/18/2018   -+ HL's: Lab Results  Component Value Date   CHOL 229 (H) 05/27/2019   HDL 55.30 05/27/2019   LDLCALC 143 (H) 08/27/2016   LDLDIRECT 106.0 05/27/2019   TRIG (H) 05/27/2019    481.0 Triglyceride is over 400; calculations on Lipids are invalid.   CHOLHDL 4 05/27/2019  On Lipitor.  - last eye exam was in 2019: No DR -No numbness and tingling in his feet.  At last visit, he was still drinking alcohol every night: 1 pint (almost 0.5 L) whiskey.  ROS: Constitutional: no weight gain/no weight loss, no fatigue, no subjective hyperthermia, no subjective hypothermia Eyes: no  blurry vision, no xerophthalmia ENT: no sore throat, no nodules palpated in neck, no dysphagia, no odynophagia, no hoarseness Cardiovascular: no CP/no SOB/no palpitations/no leg swelling Respiratory: no cough/no SOB/no wheezing Gastrointestinal: no N/no V/no D/no C/no acid reflux Musculoskeletal: no muscle aches/no joint aches Skin: no rashes, no hair loss Neurological: no tremors/no numbness/no tingling/no dizziness  I reviewed pt's medications, allergies, PMH, social hx, family hx, and changes were documented in the history of present illness. Otherwise, unchanged from my initial visit note.  Past Medical History:  Diagnosis Date  . Blood in stool     bright red blood   . Chicken pox   . Diabetes mellitus ([place])   . Erectile dysfunction   . Gout   . Hypertension    Past Surgical History:  Procedure Laterality Date  . APPENDECTOMY  2004   Social History   Social History  . Marital status: Single    Spouse name: N/A  . Number of children: 1 - 35 y/o in 2018   Occupational History  . Customer Therapist, nutritional    Social History Main Topics  . Smoking status: Former Research scientist (life sciences)  . Smokeless tobacco: Started to chew tobacco   . Alcohol use      Comment: 4-5 beers a day   . Drug use: Yes    Types: Marijuana  . Sexual activity: Yes   Social History Narrative   Works for [employer]    Not married    One daughter who does not live with him    Likes to gamble, walk in the parks, travel.    Current Outpatient Medications on File Prior to Visit  Medication Sig Dispense Refill  . allopurinol (ZYLOPRIM) 100 MG tablet Take 1 tablet (100 mg total) by mouth daily. 90 tablet 1  . amLODipine (NORVASC) 10 MG tablet TAKE 1 TABLET BY MOUTH EVERY DAY 30 tablet 8  . atorvastatin (LIPITOR) 20 MG tablet Take 1 tablet (20 mg total) by mouth daily. 90 tablet 3  . B-D UF III MINI PEN NEEDLES 31G X 5 MM MISC USE 4 TIMES DAILY AFTER MEALS AND AT BEDTIME 100 each 0  . blood glucose meter kit and supplies Dispense based on patient and insurance preference. Use up to four times daily as directed. (FOR ICD-9 250.00, 250.01). 1 each 0  . buPROPion (WELLBUTRIN XL) 300 MG 24 hr tablet TAKE 1 TABLET BY MOUTH EVERY DAY 90 tablet 1  . BYSTOLIC 10 MG tablet TAKE 1 TABLET BY MOUTH  DAILY 90 tablet 3  . cyclobenzaprine (FLEXERIL) 10 MG tablet Take 1 tablet (10 mg total) by mouth 3 (three) times daily as needed for muscle spasms. 15 tablet 0  . indomethacin (INDOCIN) 50 MG capsule Take 1 capsule (50 mg total) by mouth 3 (three) times daily as needed. 30 capsule 1  . Insulin Glargine (LANTUS SOLOSTAR) 100 UNIT/ML Solostar Pen INJECT SUBCUTANEOUSLY 30  UNITS DAILY AT 10PM 30  mL 3  . Lancets (ONETOUCH ULTRASOFT) lancets Test blood sugars as directed. Dx E11.9 100 each 12  . methylPREDNISolone (MEDROL DOSEPAK) 4 MG TBPK tablet Take as directed 21 tablet 0  . Multiple Vitamins-Minerals (MEGA MULTIVITAMIN FOR MEN) TABS Take 2 tablets by mouth daily.    . nebivolol (BYSTOLIC) 10 MG tablet Take 1 tablet (10 mg total) by mouth daily. 90 tablet 0  . omeprazole (PRILOSEC) 40 MG capsule Take 1 capsule (40 mg total) by mouth daily. Take 40 mg daily 90 capsule 2  . ONE TOUCH  ULTRA TEST test strip USE UP TO 4 TIMES DAILY AS DIRECTED 100 each 3  . sildenafil (REVATIO) 20 MG tablet TAKE 2-5 TABLETS BY MOUTH AS NEEDED FOR SEXUAL ACTIVITY (Patient taking differently: Take 40-100 mg by mouth as needed (sexual activity). ) 50 tablet 3  . TRULICITY 1.5 UX/3.2TF SOPN INJECT THE CONTENTS OF 1  PEN SUBCUTANEOUSLY ONCE A  WEEK 6 mL 3   No current facility-administered medications on file prior to visit.   No Known Allergies Family History  Problem Relation Age of Onset  . Alcohol abuse Father   . Hypertension Father   . Heart disease Father   . Breast cancer Mother   . Hypertension Mother   . Colon cancer Neg Hx   . Esophageal cancer Neg Hx   . Rectal cancer Neg Hx   . Stomach cancer Neg Hx   Pt has FH of DM in mother.  PE: BP 130/90   Pulse 80   Ht _0  (1.93 m) Comment: measured  Wt 272 lb (123.4 kg)   SpO2 99%   BMI 33.11 kg/m  Wt Readings from Last 3 Encounters:  08/31/19 272 lb (123.4 kg)  06/29/19 265 lb (120.2 kg)  05/27/19 275 lb (124.7 kg)   Constitutional: overweight, in NAD Eyes: PERRLA, EOMI, no exophthalmos ENT: moist mucous membranes, no thyromegaly, no cervical lymphadenopathy Cardiovascular: RRR, No MRG Respiratory: CTA B Gastrointestinal: abdomen soft, NT, ND, BS+ Musculoskeletal: no deformities, strength intact in all 4 Skin: moist, warm, no rashes Neurological: no tremor with outstretched hands, DTR normal in all 4  ASSESSMENT: 1. DM2,  -insulin-dependent, uncontrolled, without long term complications, but with hyperglycemia  He has type 2, rather than type 1 diabetes: Component     Latest Ref Rng & Units 08/27/2016  Glucose     70 - 99 mg/dL 118 (H)  Pancreatic Islet Cell Antibody     <5 JDF Units <5  Glutamic Acid Decarb Ab     <5 IU/mL <5  C-Peptide     0.80 - 3.85 ng/mL 1.45   2. Obesity class 1 BMI Classification:  < 18.5 underweight   18.5-24.9 normal weight   25.0-29.9 overweight   30.0-34.9 class I obesity   35.0-39.9 class II obesity   ? 40.0 class III obesity   3.  Hypertriglyceridemia  PLAN:  1. Patient with history of uncontrolled diabetes, on basal insulin and GLP-1 receptor agonist. Before last visit, he improved his diet and lost weight.at that time, HbA1c was excellent, at 6.0% and sugars were at or close to goal before meals and lower at night after he drank alcohol. We discussed about the absolute need to stop drinking alcohol but also diet sodas.. Otherwise, we did not change his regimen. However, he was lost to follow-up for the last year. -At this visit, sugars are higher at home compared to before.  He is checking later in the morning, but still fasting.  However, his HbA1c: 6.5% (slightly higher) and not aligning with his sugars at home.  Therefore, for now, I advised him to continue the same regimen but try to check his sugars twice a day, at different times of the day and send them to me in 2 weeks.  At that time, if sugars remain high, we may need to increase Trulicity dose. - I suggested to:  Patient Instructions  Please continue: - Trulicity 1.5 mg weekly - Lantus 24 units in a.m.  Please return in 6 months with your sugar log.   -  advised to check sugars at different times of the day - 1x a day, rotating check times - advised for yearly eye exams >> he is not UTD - return to clinic in 6 months  2. Obesity class 1 -Continue Trulicity which will also help with weight loss -He  lost 13 pounds before last visit by changing his diet, now gained 3 lbs since last OV  3. HTG -Patient with a history of high triglycerides -Before last visit, he changed his diet and we also discussed about stopping alcohol -Reviewed latest lipid panel: LDL improved, only slightly above target, triglycerides still high Lab Results  Component Value Date   CHOL 229 (H) 05/27/2019   HDL 55.30 05/27/2019   LDLCALC 143 (H) 08/27/2016   LDLDIRECT 106.0 05/27/2019   TRIG (H) 05/27/2019    481.0 Triglyceride is over 400; calculations on Lipids are invalid.   CHOLHDL 4 05/27/2019  - he was started on Lipitor per PCP after the above results returned >> no SEs  Philemon Kingdom, MD PhD [facility]

## 2019-08-31 NOTE — Patient Instructions (Signed)
Please continue: - Trulicity 1.5 mg weekly - Lantus 24 units in a.m.  Please return in 6 months with your sugar log.

## 2019-08-31 NOTE — Addendum Note (Signed)
Addended by: Cardell Peach I on: 08/31/2019 03:29 PM   Modules accepted: Orders

## 2019-09-02 ENCOUNTER — Other Ambulatory Visit: Payer: Self-pay | Admitting: Adult Health

## 2019-09-02 DIAGNOSIS — F339 Major depressive disorder, recurrent, unspecified: Secondary | ICD-10-CM

## 2019-09-03 NOTE — Telephone Encounter (Signed)
DX CODE WAS NEEDED.  FILLED IN FEB FOR 6 MONTHS.

## 2019-09-09 ENCOUNTER — Other Ambulatory Visit: Payer: Self-pay | Admitting: Family Medicine

## 2019-09-09 DIAGNOSIS — F339 Major depressive disorder, recurrent, unspecified: Secondary | ICD-10-CM

## 2019-09-09 MED ORDER — BUPROPION HCL ER (XL) 300 MG PO TB24: 300.0000 mg | ORAL_TABLET | Freq: Every day | ORAL | 1 refills | Status: DC

## 2019-09-09 NOTE — Telephone Encounter (Signed)
Sent to the pharmacy by e-scribe. 

## 2019-10-05 ENCOUNTER — Telehealth (INDEPENDENT_AMBULATORY_CARE_PROVIDER_SITE_OTHER): Payer: Managed Care, Other (non HMO) | Admitting: Adult Health

## 2019-10-05 ENCOUNTER — Other Ambulatory Visit: Payer: Self-pay

## 2019-10-05 ENCOUNTER — Encounter: Payer: Self-pay | Admitting: Adult Health

## 2019-10-05 DIAGNOSIS — M1A49X0 Other secondary chronic gout, multiple sites, without tophus (tophi): Secondary | ICD-10-CM | POA: Diagnosis not present

## 2019-10-05 MED ORDER — ALLOPURINOL 300 MG PO TABS: 300.0000 mg | ORAL_TABLET | Freq: Every day | ORAL | 1 refills | Status: DC

## 2019-10-05 MED ORDER — INDOMETHACIN 50 MG PO CAPS: 50.0000 mg | ORAL_CAPSULE | Freq: Three times a day (TID) | ORAL | 0 refills | Status: DC | PRN

## 2019-10-05 MED ORDER — PREDNISONE 20 MG PO TABS: 20.0000 mg | ORAL_TABLET | Freq: Every day | ORAL | 0 refills | Status: DC

## 2019-10-05 NOTE — Progress Notes (Signed)
Virtual Visit via Telephone Note  I connected with Keith Leblanc on 10/05/19 at 10:30 AM EDT by telephone and verified that I am speaking with the correct person using two identifiers.   I discussed the limitations, risks, security and privacy concerns of performing an evaluation and management service by telephone and the availability of in person appointments. I also discussed with the patient that there may be a patient responsible charge related to this service. The patient expressed understanding and agreed to proceed.  Location patient: home Location provider: work or home office Participants present for the call: patient, provider Patient did not have a visit in the prior 7 days to address this/these issue(s).   History of Present Illness: 54 year old male who is being evaluated today for an acute on chronic issue of gout flare.  Flare has been present for approximately 2 weeks.  Started in his left elbow after eating scallops and has also begun to show signs in his left knee.  Does report redness, warmth, swelling, and tenderness in both joints.  At home he has been taking indomethacin which has not helped.  Is also on allopurinol 100 mg and this was started in February 2021, he reports that since his first gout flare since starting allopurinol   Observations/Objective: Patient sounds cheerful and well on the phone. I do not appreciate any SOB. Speech and thought processing are grossly intact. Patient reported vitals:  Assessment and Plan: 1. Other secondary chronic gout of multiple sites without tophus -His last A1c 1 month ago was 6.5.  Will prescribe prednisone 20 mg for 7 days.  He was advised to increase his water intake while taking prednisone and to monitor his blood sugars closely.  We will increase allopurinol to 300 mg.  He was advised to wait on taking the medication until his recent gout flare has been resolved.  Ultimately he needs to quit eating shellfish to help  prevent any recurrent gout flares. - predniSONE (DELTASONE) 20 MG tablet; Take 1 tablet (20 mg total) by mouth daily with breakfast.  Dispense: 7 tablet; Refill: 0 - allopurinol (ZYLOPRIM) 300 MG tablet; Take 1 tablet (300 mg total) by mouth daily.  Dispense: 90 tablet; Refill: 1 - indomethacin (INDOCIN) 50 MG capsule; Take 1 capsule (50 mg total) by mouth 3 (three) times daily as needed.  Dispense: 30 capsule; Refill: 0 - Follow up if no improvement in the next 3-4 days    Follow Up Instructions:  I did not refer this patient for an OV in the next 24 hours for this/these issue(s).  I discussed the assessment and treatment plan with the patient. The patient was provided an opportunity to ask questions and all were answered. The patient agreed with the plan and demonstrated an understanding of the instructions.   The patient was advised to call back or seek an in-person evaluation if the symptoms worsen or if the condition fails to improve as anticipated.  I provided 14 minutes of non-face-to-face time during this encounter.   Dorothyann Peng, NP

## 2019-10-20 ENCOUNTER — Other Ambulatory Visit: Payer: Self-pay | Admitting: Gastroenterology

## 2019-10-20 ENCOUNTER — Other Ambulatory Visit: Payer: Self-pay | Admitting: Adult Health

## 2019-10-20 DIAGNOSIS — M1A49X0 Other secondary chronic gout, multiple sites, without tophus (tophi): Secondary | ICD-10-CM

## 2019-10-20 NOTE — Telephone Encounter (Signed)
Needs annual follow-up. May have refill until that appointment. Thanks.

## 2019-10-27 ENCOUNTER — Telehealth: Payer: Self-pay | Admitting: Adult Health

## 2019-10-27 DIAGNOSIS — M1A49X0 Other secondary chronic gout, multiple sites, without tophus (tophi): Secondary | ICD-10-CM

## 2019-10-27 NOTE — Telephone Encounter (Signed)
Pt states that he has been treating gout and he is having a reoccurring flare that moves from joint to joint. His PCP has been sending medication in for the last month and it still has not treated it. It is still painful and he does get some relief from his pain medication but he is still missing work due to this. He will be contacting his HR for FMLA and wanted to give his PCP a heads up because they will probably send FMLA paperwork.    He is not sure what else he can do to help it. Pt stated he has changed his diet, joined groups for tips and advice, he has been taking his medication like he is suppose to and feels he has hit a dead end and needs to know what to do from here.    Pt can be reached at [phone]

## 2019-10-27 NOTE — Telephone Encounter (Signed)
Please refer to rheumatology

## 2019-10-27 NOTE — Telephone Encounter (Signed)
Spoke to the pt and informed him that Tommi Rumps has authorized a referral.  Pt agreed.  Nothing further needed.

## 2019-10-28 ENCOUNTER — Other Ambulatory Visit: Payer: Self-pay | Admitting: Internal Medicine

## 2019-10-29 ENCOUNTER — Encounter (HOSPITAL_COMMUNITY): Payer: Self-pay

## 2019-10-29 ENCOUNTER — Emergency Department (HOSPITAL_COMMUNITY): Payer: Managed Care, Other (non HMO)

## 2019-10-29 ENCOUNTER — Emergency Department (HOSPITAL_COMMUNITY)
Admission: EM | Admit: 2019-10-29 | Discharge: 2019-10-29 | Disposition: A | Discharge status: Home / Self Care | Payer: Managed Care, Other (non HMO) | Attending: Emergency Medicine | Admitting: Emergency Medicine

## 2019-10-29 ENCOUNTER — Other Ambulatory Visit: Payer: Self-pay

## 2019-10-29 DIAGNOSIS — M25461 Effusion, right knee: Secondary | ICD-10-CM | POA: Diagnosis not present

## 2019-10-29 DIAGNOSIS — M109 Gout, unspecified: Secondary | ICD-10-CM

## 2019-10-29 DIAGNOSIS — Z87891 Personal history of nicotine dependence: Secondary | ICD-10-CM | POA: Diagnosis not present

## 2019-10-29 DIAGNOSIS — Z79899 Other long term (current) drug therapy: Secondary | ICD-10-CM | POA: Insufficient documentation

## 2019-10-29 DIAGNOSIS — Z794 Long term (current) use of insulin: Secondary | ICD-10-CM | POA: Diagnosis not present

## 2019-10-29 DIAGNOSIS — M1A49X0 Other secondary chronic gout, multiple sites, without tophus (tophi): Secondary | ICD-10-CM

## 2019-10-29 DIAGNOSIS — M2540 Effusion, unspecified joint: Secondary | ICD-10-CM

## 2019-10-29 DIAGNOSIS — M25469 Effusion, unspecified knee: Secondary | ICD-10-CM

## 2019-10-29 DIAGNOSIS — I10 Essential (primary) hypertension: Secondary | ICD-10-CM | POA: Insufficient documentation

## 2019-10-29 DIAGNOSIS — E119 Type 2 diabetes mellitus without complications: Secondary | ICD-10-CM | POA: Insufficient documentation

## 2019-10-29 DIAGNOSIS — M25522 Pain in left elbow: Secondary | ICD-10-CM | POA: Diagnosis present

## 2019-10-29 MED ORDER — PREDNISONE 10 MG (21) PO TBPK: ORAL_TABLET | ORAL | 0 refills | Status: AC

## 2019-10-29 MED ORDER — COLCHICINE 0.6 MG PO TABS
ORAL_TABLET | ORAL | 1 refills | Status: DC
Start: 2019-10-29 — End: 2020-01-26

## 2019-10-29 MED ORDER — INDOMETHACIN 50 MG PO CAPS: 50.0000 mg | ORAL_CAPSULE | Freq: Three times a day (TID) | ORAL | 0 refills | Status: DC | PRN

## 2019-10-29 NOTE — Discharge Instructions (Addendum)
Take the medications as prescribed.  Follow up with the rheumatologist as planned.  Make sure to monitor your blood sugar.  The prednisone will typically increase your glucose levels.

## 2019-10-29 NOTE — ED Provider Notes (Signed)
[facility] DEPT Provider Note   CSN: [id number] Arrival date & time: 10/29/19  0756     History Chief Complaint  Patient presents with  . Knee Pain  . Generalized Body Aches    Keith Leblanc is a 54 y.o. male.  HPI   Patient presents to the emergency room for evaluation of knee pain and swelling.  Patient has a history of gout.  He started having episode of left elbow pain over a month ago.  Patient saw his primary care doctor.  He has taken indomethacin as well as steroids.  Patient states he started having pain in his knee and developed swelling.  That has been increasing in severity.  Patient also noticed some swelling now in his foot.  His primary care doctor has referred him to a rheumatologist but that appointment is not until August.  Patient came to the ED because he is running out of his indomethacin is and is having persistent pain and difficulty walking because of the swelling.  He denies any fevers or chills.  He has not noticed any skin rashes.  He denies any genital ulcers or discharge.  Past Medical History:  Diagnosis Date  . Blood in stool    bright red blood   . Chicken pox   . Diabetes mellitus ([place])   . Erectile dysfunction   . Gout   . Hypertension     Patient Active Problem List   Diagnosis Date Noted  . Hypertriglyceridemia 05/04/2018  . Class 1 drug-induced obesity with serious comorbidity and body mass index (BMI) of 32.0 to 32.9 in adult 01/21/2017  . Type 2 diabetes mellitus with hyperglycemia ([place]) 08/27/2016  . Protein-calorie malnutrition, severe 07/08/2016  . Diabetic ketoacidosis without coma associated with type 2 diabetes mellitus ([place]) 07/07/2016  . Acute renal failure (ARF) ([place]) 07/07/2016  . Hyponatremia 07/07/2016  . Hyperkalemia 07/07/2016  . Obesity (BMI 30-39.9) 07/07/2016  . Tobacco abuse 07/07/2016  . Overweight (BMI 25.0-29.9)   . Blood in stool 05/21/2016  . Essential hypertension 04/28/2015    . Erectile dysfunction 04/28/2015  . Gout 04/28/2015    Past Surgical History:  Procedure Laterality Date  . APPENDECTOMY  2004       Family History  Problem Relation Age of Onset  . Alcohol abuse Father   . Hypertension Father   . Heart disease Father   . Breast cancer Mother   . Hypertension Mother   . Colon cancer Neg Hx   . Esophageal cancer Neg Hx   . Rectal cancer Neg Hx   . Stomach cancer Neg Hx     Social History   Tobacco Use  . Smoking status: Former Research scientist (life sciences)  . Smokeless tobacco: Never Used  Substance Use Topics  . Alcohol use: Yes    Alcohol/week: 4.0 - 5.0 standard drinks    Types: 4 - 5 Cans of beer per week  . Drug use: Not Currently    Types: Marijuana    Home Medications Prior to Admission medications   Medication Sig Start Date End Date Taking? Authorizing Provider  allopurinol (ZYLOPRIM) 300 MG tablet Take 1 tablet (300 mg total) by mouth daily. 10/05/19   Nafziger, Tommi Rumps, NP  amLODipine (NORVASC) 10 MG tablet TAKE 1 TABLET BY MOUTH EVERY DAY 07/21/19   Nafziger, Tommi Rumps, NP  atorvastatin (LIPITOR) 20 MG tablet Take 1 tablet (20 mg total) by mouth daily. Patient not taking: Reported on 10/05/2019 05/28/19   Dorothyann Peng, NP  B-D  UF III MINI PEN NEEDLES 31G X 5 MM MISC USE 4 TIMES DAILY AFTER MEALS AND AT BEDTIME 10/28/19   Philemon Kingdom, MD  blood glucose meter kit and supplies Dispense based on patient and insurance preference. Use up to four times daily as directed. (FOR ICD-9 250.00, 250.01). 07/09/16   Eugenie Filler, MD  buPROPion (WELLBUTRIN XL) 300 MG 24 hr tablet Take 1 tablet (300 mg total) by mouth daily. 09/09/19   Nafziger, Tommi Rumps, NP  BYSTOLIC 10 MG tablet TAKE 1 TABLET BY MOUTH  DAILY 07/20/19   Nafziger, Tommi Rumps, NP  colchicine 0.6 MG tablet Take two tablets at once.  Then take 1 tablet 1 hr later.  You can repeat if needed in 3 days 10/29/19   Dorie Rank, MD  indomethacin (INDOCIN) 50 MG capsule Take 1 capsule (50 mg total) by mouth 3 (three)  times daily as needed. 10/29/19   Dorie Rank, MD  Insulin Glargine (LANTUS SOLOSTAR) 100 UNIT/ML Solostar Pen INJECT SUBCUTANEOUSLY 30  UNITS DAILY AT 10PM 04/28/19   Philemon Kingdom, MD  Lancets [facility] ULTRASOFT) lancets Test blood sugars as directed. Dx E11.9 08/06/16   Dorothyann Peng, NP  nebivolol (BYSTOLIC) 10 MG tablet Take 1 tablet (10 mg total) by mouth daily. 07/20/19   Nafziger, Tommi Rumps, NP  omeprazole (PRILOSEC) 40 MG capsule TAKE 1 CAPSULE BY MOUTH  DAILY 10/21/19   Thornton Park, MD  ONE TOUCH ULTRA TEST test strip USE UP TO 4 TIMES DAILY AS DIRECTED 08/30/16   Laurey Morale, MD  predniSONE (STERAPRED UNI-PAK 21 TAB) 10 MG (21) TBPK tablet Take 6 tablets (60 mg total) by mouth daily for 2 days, THEN 5 tablets (50 mg total) daily for 2 days, THEN 4 tablets (40 mg total) daily for 2 days, THEN 3 tablets (30 mg total) daily for 2 days, THEN 2 tablets (20 mg total) daily for 2 days, THEN 1 tablet (10 mg total) daily for 2 days. 10/29/19 11/10/19  Dorie Rank, MD  sildenafil (REVATIO) 20 MG tablet TAKE 2-5 TABLETS BY MOUTH AS NEEDED FOR SEXUAL ACTIVITY Patient taking differently: Take 40-100 mg by mouth as needed (sexual activity).  05/05/18   Nafziger, Tommi Rumps, NP  TRULICITY 1.5 LH/7.3SK [facility] INJECT THE CONTENTS OF 1  PEN SUBCUTANEOUSLY ONCE A  WEEK 04/26/19   Philemon Kingdom, MD    Allergies    Patient has no known allergies.  Review of Systems   Review of Systems  All other systems reviewed and are negative.   Physical Exam Updated Vital Signs BP (!) 174/95 (BP Location: Right Arm)   Pulse 92   Temp 98.7 F (37.1 C) (Oral)   Resp 16   Ht 1.956 m (_0 )   Wt 120.2 kg   SpO2 100%   BMI 31.42 kg/m   Physical Exam Vitals and nursing note reviewed.  Constitutional:      General: He is not in acute distress.    Appearance: He is well-developed.  HENT:     Head: Normocephalic and atraumatic.     Right Ear: External ear normal.     Left Ear: External ear normal.  Eyes:      General: No scleral icterus.       Right eye: No discharge.        Left eye: No discharge.     Conjunctiva/sclera: Conjunctivae normal.  Neck:     Trachea: No tracheal deviation.  Cardiovascular:     Rate and Rhythm: Normal rate.  Pulmonary:  Effort: Pulmonary effort is normal. No respiratory distress.     Breath sounds: No stridor.  Abdominal:     General: There is no distension.  Musculoskeletal:        General: Swelling present. No deformity.     Cervical back: Neck supple.     Comments: Effusion right knee, no erythema, pain with rom, left elbow with edema left bursa, no erythema, mild edema and ttp right midfoot  Skin:    General: Skin is warm and dry.     Findings: No rash.  Neurological:     Mental Status: He is alert.     Cranial Nerves: Cranial nerve deficit: no gross deficits.     ED Results / Procedures / Treatments   Labs (all labs ordered are listed, but only abnormal results are displayed) Labs Reviewed - No data to display  EKG None  Radiology DG Knee Complete 4 Views Right  Result Date: 10/29/2019 CLINICAL DATA:  Gout. Severe right-sided knee pain. EXAM: RIGHT KNEE - COMPLETE 4+ VIEW COMPARISON:  None FINDINGS: Large knee effusion is present. No significant erosions are present. No acute or focal osseous abnormality is present. IMPRESSION: 1. Large knee effusion. 2. No significant erosions. Electronically Signed   By: San Morelle M.D.   On: 10/29/2019 09:14    Procedures Procedures (including critical care time)  Medications Ordered in ED Medications - No data to display  ED Course  I have reviewed the triage vital signs and the nursing notes.  Pertinent labs & imaging results that were available during my care of the patient were reviewed by me and considered in my medical decision making (see chart for details).    MDM Rules/Calculators/A&P                     Pt previously treated for gout.  No signs to suggest acute infection,  septic arthritis.  No fever or or other sx to suggest other infectious etiology.  Pt has referral to rheum.  Will treat with steroids, colchicine, refill indocin.  Monitor glucose Final Clinical Impression(s) / ED Diagnoses Final diagnoses:  Knee swelling  Gout, arthritis  Effusion of joint, unspecified location    Rx / DC Orders ED Discharge Orders         Ordered    predniSONE (STERAPRED UNI-PAK 21 TAB) 10 MG (21) TBPK tablet     10/29/19 1151    colchicine 0.6 MG tablet     10/29/19 1151    indomethacin (INDOCIN) 50 MG capsule  3 times daily PRN     10/29/19 1151           Dorie Rank, MD 10/29/19 1152

## 2019-10-29 NOTE — ED Triage Notes (Signed)
Patient reports that he is having right knee pain and swelling. Patient has been treated for gout in other joints in the past. Patient states he has been referred to a rheumatologist, but appointment is not until August.

## 2019-11-04 ENCOUNTER — Telehealth: Payer: Self-pay | Admitting: Family Medicine

## 2019-11-04 ENCOUNTER — Other Ambulatory Visit: Payer: Self-pay | Admitting: Adult Health

## 2019-11-04 NOTE — Telephone Encounter (Signed)
Form dropped off at front desk.  Only charge sheet attached.  Not the form directing me what to do once completed.  Called and left a message for the pt to pick up at the front desk.  Copy sent to scan.  Nothing further needed.

## 2019-11-05 NOTE — Telephone Encounter (Signed)
SENT BY E-SCRIBE TO THE PHARMACY ON 05/28/2019 FOR 1 YEAR.

## 2019-11-11 NOTE — Telephone Encounter (Signed)
The start date on the patients FMLA paperwork is incorrect. He is faxing the forms back to our office to have the date updated and you can put your initials by the correction and fax the form back to the company after this is updated.

## 2019-11-16 NOTE — Telephone Encounter (Signed)
Arbie Cookey and [place] both notified that I have not received form.

## 2019-11-16 NOTE — Telephone Encounter (Signed)
Patient is re faxing the FMLA form to have the date updated.

## 2019-11-16 NOTE — Telephone Encounter (Signed)
Waiting to receive forms.

## 2019-11-16 NOTE — Telephone Encounter (Signed)
Received and placed in Cory's folder for correction.

## 2019-11-16 NOTE — Telephone Encounter (Signed)
received fax from patient and gave to [facility]

## 2019-11-16 NOTE — Telephone Encounter (Signed)
Per pt, the correct date is 10/27/19. Please fax to the number that is at the bottom of the page. Thanks

## 2020-01-10 ENCOUNTER — Other Ambulatory Visit: Payer: Self-pay | Admitting: Adult Health

## 2020-01-10 DIAGNOSIS — F339 Major depressive disorder, recurrent, unspecified: Secondary | ICD-10-CM

## 2020-01-12 NOTE — Progress Notes (Signed)
Office Visit Note  Patient: Keith Leblanc             Date of Birth: 05/26/1966           MRN: [id number]             PCP: Dorothyann Peng, NP Referring: Dorothyann Peng, NP Visit Date: 01/26/2020 Occupation: @GUAROCC @  Subjective:  Pain in multiple joints.   History of Present Illness: Keith Leblanc is a 54 y.o. male seen in consultation per request of his PCP for evaluation of.  According the patient in his early 72s when he used to play basketball he will have ankle joint pain.  He is to think that he twisted his ankle.  Eventually the episode became more frequent.  He recalls going to the urgent care where he was told that he possibly has gout.  Over the years he took anti-inflammatories for pain.  He states gradually the episodes moved from his feet and ankles to his knees and then elbows and hands.  He states episodes have become more frequent now.  The gout flares happen almost every 2 months and last for about 2 weeks.  He mostly takes Indocin or colchicine as needed for the flares.  He sometimes uses topical agents.  He was started on allopurinol 100 mg p.o. daily in May which was increased to 300 milligrams last month.  His last gout flare was in the beginning of July.  He continues to have pain and discomfort in his hands, his feet and his knee joints.  Activities of Daily Living:  Patient reports morning stiffness for 5  minutes.   Patient Denies nocturnal pain.  Difficulty dressing/grooming: Denies Difficulty climbing stairs: Denies Difficulty getting out of chair: Denies Difficulty using hands for taps, buttons, cutlery, and/or writing: Denies  Review of Systems  Constitutional: Negative for fatigue.  HENT: Negative for mouth sores, mouth dryness and nose dryness.   Eyes: Negative for itching and dryness.  Respiratory: Negative for shortness of breath and difficulty breathing.   Cardiovascular: Negative for chest pain and palpitations.  Gastrointestinal: Negative for  blood in stool, constipation and diarrhea.  Endocrine: Negative for increased urination.  Genitourinary: Negative for difficulty urinating.  Musculoskeletal: Positive for arthralgias, joint pain, joint swelling and morning stiffness. Negative for myalgias, muscle tenderness and myalgias.  Skin: Negative for color change, rash and redness.  Allergic/Immunologic: Negative for susceptible to infections.  Neurological: Negative for dizziness, numbness, headaches, memory loss and weakness.  Hematological: Negative for bruising/bleeding tendency.  Psychiatric/Behavioral: Positive for sleep disturbance. Negative for confusion.    PMFS History:  Patient Active Problem List   Diagnosis Date Noted  . Hypertriglyceridemia 05/04/2018  . Class 1 drug-induced obesity with serious comorbidity and body mass index (BMI) of 32.0 to 32.9 in adult 01/21/2017  . Type 2 diabetes mellitus with hyperglycemia ([place]) 08/27/2016  . Protein-calorie malnutrition, severe 07/08/2016  . Diabetic ketoacidosis without coma associated with type 2 diabetes mellitus ([place]) 07/07/2016  . Acute renal failure (ARF) ([place]) 07/07/2016  . Hyponatremia 07/07/2016  . Hyperkalemia 07/07/2016  . Obesity (BMI 30-39.9) 07/07/2016  . Tobacco abuse 07/07/2016  . Overweight (BMI 25.0-29.9)   . Blood in stool 05/21/2016  . Essential hypertension 04/28/2015  . Erectile dysfunction 04/28/2015  . Gout 04/28/2015    Past Medical History:  Diagnosis Date  . Blood in stool    bright red blood   . Chicken pox   . Diabetes mellitus ([place])   . Erectile dysfunction   .  Gout   . Hypertension     Family History  Problem Relation Age of Onset  . Alcohol abuse Father   . Hypertension Father   . Heart disease Father   . Gout Father   . Breast cancer Mother   . Hypertension Mother   . Gout Mother   . Healthy Sister   . Healthy Daughter   . Colon cancer Neg Hx   . Esophageal cancer Neg Hx   . Rectal cancer Neg Hx   . Stomach cancer Neg  Hx    Past Surgical History:  Procedure Laterality Date  . APPENDECTOMY  2004  . COLONOSCOPY     Social History   Social History Narrative   Works for [employer]    Not married    One daughter who does not live with him    Likes to gamble, walk in the parks, travel.    Immunization History  Administered Date(s) Administered  . Pneumococcal Polysaccharide-23 04/03/2018     Objective: Vital Signs: BP (!) 144/95 (BP Location: Right Arm, Patient Position: Sitting, Cuff Size: Large)   Pulse 82   Resp 16   Ht 6\' 5"  (1.956 m)   Wt 270 lb (122.5 kg)   BMI 32.02 kg/m    Physical Exam Vitals and nursing note reviewed.  Constitutional:      Appearance: He is well-developed.  HENT:     Head: Normocephalic and atraumatic.  Eyes:     Conjunctiva/sclera: Conjunctivae normal.     Pupils: Pupils are equal, round, and reactive to light.  Cardiovascular:     Rate and Rhythm: Normal rate and regular rhythm.     Heart sounds: Normal heart sounds.  Pulmonary:     Effort: Pulmonary effort is normal.     Breath sounds: Normal breath sounds.  Abdominal:     General: Bowel sounds are normal.     Palpations: Abdomen is soft.  Musculoskeletal:     Cervical back: Normal range of motion and neck supple.  Skin:    General: Skin is warm and dry.     Capillary Refill: Capillary refill takes less than 2 seconds.  Neurological:     Mental Status: He is alert and oriented to person, place, and time.  Psychiatric:        Behavior: Behavior normal.      Musculoskeletal Exam: C-spine thoracic and lumbar spine with good range of motion.  Shoulder joints, elbow joints, wrist joints, MCPs PIPs and DIPs with good range of motion with no synovitis.  Hip joints, knee joints, ankles, MTPs and PIPs with good range of motion with no synovitis.  CDAI Exam: CDAI Score: -- Patient Global: --; Provider Global: -- Swollen: --; Tender: -- Joint Exam 01/26/2020   No joint exam has been documented for this  visit   There is currently no information documented on the homunculus. Go to the Rheumatology activity and complete the homunculus joint exam.  Investigation: No additional findings.  Imaging: No results found.  Recent Labs: Lab Results  Component Value Date   WBC 8.2 05/27/2019   HGB 12.6 (L) 05/27/2019   PLT 413.0 (H) 05/27/2019   NA 140 05/27/2019   K 3.9 05/27/2019   CL 100 05/27/2019   CO2 29 05/27/2019   GLUCOSE 106 (H) 05/27/2019   BUN 17 05/27/2019   CREATININE 1.25 05/27/2019   BILITOT 0.4 05/27/2019   ALKPHOS 49 05/27/2019   AST 18 05/27/2019   ALT 22 05/27/2019   PROT  7.6 05/27/2019   ALBUMIN 4.4 05/27/2019   CALCIUM 9.9 05/27/2019   GFRAA >60 06/18/2018    Speciality Comments: No specialty comments available.  Procedures:  No procedures performed Allergies: Patient has no known allergies.   Assessment / Plan:     Visit Diagnoses: Gouty arthropathy, chronic, without tophi-history of gouty arthropathy treated with colchicine on as needed basis and allopurinol 300 mg p.o. daily.  Allopurinol was a started in May.  Patient continues to have flares almost every 2 months.  His last flare was in July.  Detailed counseling guarding gouty arthropathy was provided.  I advised him to take colchicine 1 tablet p.o. daily as prophylactic therapy.  He will continue allopurinol 300 mg p.o. daily.  We will check labs today.  If his uric acid is not in desirable range I will increase the allopurinol dose as needed.  Goal is to keep the uric acid below 6.0.  Dietary modifications were discussed at length.  Need for regular exercise and weight loss was emphasized.  Pain in both hands -he complains of pain and discomfort in his bilateral hands.  Plan: XR Hand 2 View Right, XR Hand 2 View Left.  X-ray of bilateral hands were consistent with osteoarthritis.  Pain in both elbows-x-ray of bilateral elbows 2 views were obtained today.  Spurring was noted in the right elbow.  Left elbow  joint was unremarkable.  Chronic pain of both knees -he has frequent flares in his knees.  He has chronic knee joint discomfort.  Plan: XR KNEE 3 VIEW RIGHT, XR KNEE 3 VIEW LEFT.  X-ray showed moderate osteoarthritis and moderate chondromalacia patella.  Pain in both feet -he has pain and discomfort in his bilateral feet.  Plan: XR Foot 2 Views Right, XR Foot 2 Views Left, x-rays were consistent with osteoarthritis.  Sedimentation rate, Uric acid, Rheumatoid factor, Cyclic citrul peptide antibody, IgG  Medication management - Plan: CBC with Differential/Platelet, COMPLETE METABOLIC PANEL WITH GFR  Essential hypertension-his blood pressure is elevated.  He may benefit from losartan which will decrease uric acid as well.  Hypertriglyceridemia-he is currently not taking any lipid-lowering agents.  Dietary modifications were discussed.  Diabetic ketoacidosis without coma associated with type 2 diabetes mellitus ([place])  Former smoker - 1 cigar a day x10 years, quit 2018.  Class 1 drug-induced obesity with serious comorbidity and body mass index (BMI) of 32.0 to 32.9 in adult  Orders: Orders Placed This Encounter  Procedures  . XR Hand 2 View Right  . XR Hand 2 View Left  . XR Foot 2 Views Right  . XR Foot 2 Views Left  . XR KNEE 3 VIEW RIGHT  . XR KNEE 3 VIEW LEFT  . CBC with Differential/Platelet  . COMPLETE METABOLIC PANEL WITH GFR  . Sedimentation rate  . Uric acid  . Rheumatoid factor  . Cyclic citrul peptide antibody, IgG   Meds ordered this encounter  Medications  . colchicine 0.6 MG tablet    Sig: Take colchicine 0.6 mg, 1 tablet p.o. daily.  Take colchicine 0.6 mg p.o. twice daily for flares.    Dispense:  60 tablet    Refill:  2     Follow-Up Instructions: Return for Gout.   Bo Merino, MD  Note - This record has been created using Editor, commissioning.  Chart creation errors have been sought, but may not always  have been located. Such creation errors do not  reflect on  the standard of medical care.

## 2020-01-13 NOTE — Telephone Encounter (Signed)
Sent to the pharmacy by e-scribe. 

## 2020-01-26 ENCOUNTER — Ambulatory Visit: Payer: Self-pay

## 2020-01-26 ENCOUNTER — Other Ambulatory Visit: Payer: Self-pay

## 2020-01-26 ENCOUNTER — Ambulatory Visit: Payer: Managed Care, Other (non HMO) | Admitting: Rheumatology

## 2020-01-26 ENCOUNTER — Encounter: Payer: Self-pay | Admitting: Rheumatology

## 2020-01-26 VITALS — BP 144/95 | HR 82 | Resp 16 | Ht 77.0 in | Wt 270.0 lb

## 2020-01-26 DIAGNOSIS — M25562 Pain in left knee: Secondary | ICD-10-CM

## 2020-01-26 DIAGNOSIS — M79671 Pain in right foot: Secondary | ICD-10-CM | POA: Diagnosis not present

## 2020-01-26 DIAGNOSIS — E781 Pure hyperglyceridemia: Secondary | ICD-10-CM

## 2020-01-26 DIAGNOSIS — M79641 Pain in right hand: Secondary | ICD-10-CM | POA: Diagnosis not present

## 2020-01-26 DIAGNOSIS — I10 Essential (primary) hypertension: Secondary | ICD-10-CM

## 2020-01-26 DIAGNOSIS — Z6832 Body mass index (BMI) 32.0-32.9, adult: Secondary | ICD-10-CM

## 2020-01-26 DIAGNOSIS — M25521 Pain in right elbow: Secondary | ICD-10-CM

## 2020-01-26 DIAGNOSIS — M25522 Pain in left elbow: Secondary | ICD-10-CM

## 2020-01-26 DIAGNOSIS — E1110 Type 2 diabetes mellitus with ketoacidosis without coma: Secondary | ICD-10-CM

## 2020-01-26 DIAGNOSIS — E661 Drug-induced obesity: Secondary | ICD-10-CM

## 2020-01-26 DIAGNOSIS — M1A00X0 Idiopathic chronic gout, unspecified site, without tophus (tophi): Secondary | ICD-10-CM | POA: Diagnosis not present

## 2020-01-26 DIAGNOSIS — G8929 Other chronic pain: Secondary | ICD-10-CM

## 2020-01-26 DIAGNOSIS — Z87891 Personal history of nicotine dependence: Secondary | ICD-10-CM

## 2020-01-26 DIAGNOSIS — M79642 Pain in left hand: Secondary | ICD-10-CM

## 2020-01-26 DIAGNOSIS — M25561 Pain in right knee: Secondary | ICD-10-CM

## 2020-01-26 DIAGNOSIS — M79672 Pain in left foot: Secondary | ICD-10-CM | POA: Diagnosis not present

## 2020-01-26 DIAGNOSIS — Z79899 Other long term (current) drug therapy: Secondary | ICD-10-CM

## 2020-01-26 MED ORDER — COLCHICINE 0.6 MG PO TABS
ORAL_TABLET | ORAL | 2 refills | Status: DC
Start: 2020-01-26 — End: 2020-05-01

## 2020-01-26 NOTE — Patient Instructions (Addendum)
Take colchicine 1 tablet daily.  If you have a gout flare increase colchicine to twice a day until the flare resolves.  Take allopurinol 300 mg 1 tablet daily. Low-Purine Eating Plan A low-purine eating plan involves making food choices to limit your intake of purine. Purine is a kind of uric acid. Too much uric acid in your blood can cause certain conditions, such as gout and kidney stones. Eating a low-purine diet can help control these conditions. What are tips for following this plan? Reading food labels   Avoid foods with saturated or Trans fat.  Check the ingredient list of grains-based foods, such as bread and cereal, to make sure that they contain whole grains.  Check the ingredient list of sauces or soups to make sure they do not contain meat or fish.  When choosing soft drinks, check the ingredient list to make sure they do not contain high-fructose corn syrup. Shopping  Buy plenty of fresh fruits and vegetables.  Avoid buying canned or fresh fish.  Buy dairy products labeled as low-fat or nonfat.  Avoid buying premade or processed foods. These foods are often high in fat, salt (sodium), and added sugar. Cooking  Use olive oil instead of butter when cooking. Oils like olive oil, canola oil, and sunflower oil contain healthy fats. Meal planning  Learn which foods do or do not affect you. If you find out that a food tends to cause your gout symptoms to flare up, avoid eating that food. You can enjoy foods that do not cause problems. If you have any questions about a food item, talk with your dietitian or health care provider.  Limit foods high in fat, especially saturated fat. Fat makes it harder for your body to get rid of uric acid.  Choose foods that are lower in fat and are lean sources of protein. General guidelines  Limit alcohol intake to no more than 1 drink a day for nonpregnant women and 2 drinks a day for men. One drink equals 12 oz of beer, 5 oz of wine, or 1  oz of hard liquor. Alcohol can affect the way your body gets rid of uric acid.  Drink plenty of water to keep your urine clear or pale yellow. Fluids can help remove uric acid from your body.  If directed by your health care provider, take a vitamin C supplement.  Work with your health care provider and dietitian to develop a plan to achieve or maintain a healthy weight. Losing weight can help reduce uric acid in your blood. What foods are recommended? The items listed may not be a complete list. Talk with your dietitian about what dietary choices are best for you. Foods low in purines Foods low in purines do not need to be limited. These include:  All fruits.  All low-purine vegetables, pickles, and olives.  Breads, pasta, rice, cornbread, and popcorn. Cake and other baked goods.  All dairy foods.  Eggs, nuts, and nut butters.  Spices and condiments, such as salt, herbs, and vinegar.  Plant oils, butter, and margarine.  Water, sugar-free soft drinks, tea, coffee, and cocoa.  Vegetable-based soups, broths, sauces, and gravies. Foods moderate in purines Foods moderate in purines should be limited to the amounts listed.   cup of asparagus, cauliflower, spinach, mushrooms, or green peas, each day.  2/3 cup uncooked oatmeal, each day.   cup dry wheat bran or wheat germ, each day.  2-3 ounces of meat or poultry, each day.  4-6 ounces  of shellfish, such as crab, lobster, oysters, or shrimp, each day.  1 cup cooked beans, peas, or lentils, each day.  Soup, broths, or bouillon made from meat or fish. Limit these foods as much as possible. What foods are not recommended? The items listed may not be a complete list. Talk with your dietitian about what dietary choices are best for you. Limit your intake of foods high in purines, including:  Beer and other alcohol.  Meat-based gravy or sauce.  Canned or fresh fish, such as: ? Anchovies, sardines, herring, and  tuna. ? Mussels and scallops. ? Codfish, trout, and haddock.  Berniece Salines.  Organ meats, such as: ? Liver or kidney. ? Tripe. ? Sweetbreads (thymus gland or pancreas).  Wild [employer] biochemist.  Yeast or yeast extract supplements.  Drinks sweetened with high-fructose corn syrup. Summary  Eating a low-purine diet can help control conditions caused by too much uric acid in the body, such as gout or kidney stones.  Choose low-purine foods, limit alcohol, and limit foods high in fat.  You will learn over time which foods do or do not affect you. If you find out that a food tends to cause your gout symptoms to flare up, avoid eating that food. This information is not intended to replace advice given to you by your health care provider. Make sure you discuss any questions you have with your health care provider. Document Revised: 05/23/2017 Document Reviewed: 07/24/2016 Elsevier Patient Education  [place].   Gout  Gout is painful swelling of your joints. Gout is a type of arthritis. It is caused by having too much uric acid in your body. Uric acid is a chemical that is made when your body breaks down substances called purines. If your body has too much uric acid, sharp crystals can form and build up in your joints. This causes pain and swelling. Gout attacks can happen quickly and be very painful (acute gout). Over time, the attacks can affect more joints and happen more often (chronic gout). What are the causes?  Too much uric acid in your blood. This can happen because: ? Your kidneys do not remove enough uric acid from your blood. ? Your body makes too much uric acid. ? You eat too many foods that are high in purines. These foods include organ meats, some seafood, and beer.  Trauma or stress. What increases the risk?  Having a family history of gout.  Being male and middle-aged.  Being male and having gone through menopause.  Being very overweight (obese).  Drinking  alcohol, especially beer.  Not having enough water in the body (being dehydrated).  Losing weight too quickly.  Having an organ transplant.  Having lead poisoning.  Taking certain medicines.  Having kidney disease.  Having a skin condition called psoriasis. What are the signs or symptoms? An attack of acute gout usually happens in just one joint. The most common place is the big toe. Attacks often start at night. Other joints that may be affected include joints of the feet, ankle, knee, fingers, wrist, or elbow. Symptoms of an attack may include:  Very bad pain.  Warmth.  Swelling.  Stiffness.  Shiny, red, or purple skin.  Tenderness. The affected joint may be very painful to touch.  Chills and fever. Chronic gout may cause symptoms more often. More joints may be involved. You may also have white or yellow lumps (tophi) on your hands or feet or in other areas near your joints.  How is this treated?  Treatment for this condition has two phases: treating an acute attack and preventing future attacks.  Acute gout treatment may include: ? NSAIDs. ? Steroids. These are taken by mouth or injected into a joint. ? Colchicine. This medicine relieves pain and swelling. It can be given by mouth or through an IV tube.  Preventive treatment may include: ? Taking small doses of NSAIDs or colchicine daily. ? Using a medicine that reduces uric acid levels in your blood. ? Making changes to your diet. You may need to see a food expert (dietitian) about what to eat and drink to prevent gout. Follow these instructions at home: During a gout attack   If told, put ice on the painful area: ? Put ice in a plastic bag. ? Place a towel between your skin and the bag. ? Leave the ice on for 20 minutes, 2-3 times a day.  Raise (elevate) the painful joint above the level of your heart as often as you can.  Rest the joint as much as possible. If the joint is in your leg, you may be given  crutches.  Follow instructions from your doctor about what you cannot eat or drink. Avoiding future gout attacks  Eat a low-purine diet. Avoid foods and drinks such as: ? Liver. ? Kidney. ? Anchovies. ? Asparagus. ? Herring. ? Mushrooms. ? Mussels. ? Beer.  Stay at a healthy weight. If you want to lose weight, talk with your doctor. Do not lose weight too fast.  Start or continue an exercise plan as told by your doctor. Eating and drinking  Drink enough fluids to keep your pee (urine) pale yellow.  If you drink alcohol: ? Limit how much you use to:  0-1 drink a day for women.  0-2 drinks a day for men. ? Be aware of how much alcohol is in your drink. In the [place] one drink equals one 12 oz bottle of beer (355 mL), one 5 oz glass of wine (148 mL), or one 1 oz glass of hard liquor (44 mL). General instructions  Take over-the-counter and prescription medicines only as told by your doctor.  Do not drive or use heavy machinery while taking prescription pain medicine.  Return to your normal activities as told by your doctor. Ask your doctor what activities are safe for you.  Keep all follow-up visits as told by your doctor. This is important. Contact a doctor if:  You have another gout attack.  You still have symptoms of a gout attack after 10 days of treatment.  You have problems (side effects) because of your medicines.  You have chills or a fever.  You have burning pain when you pee (urinate).  You have pain in your lower back or belly. Get help right away if:  You have very bad pain.  Your pain cannot be controlled.  You cannot pee. Summary  Gout is painful swelling of the joints.  The most common site of pain is the big toe, but it can affect other joints.  Medicines and avoiding some foods can help to prevent and treat gout attacks. This information is not intended to replace advice given to you by your health care provider. Make sure you discuss any  questions you have with your health care provider. Document Revised: 12/31/2017 Document Reviewed: 12/31/2017 Elsevier Patient Education  [place].

## 2020-01-27 LAB — CYCLIC CITRUL PEPTIDE ANTIBODY, IGG: Cyclic Citrullin Peptide Ab: <16 UNITS

## 2020-01-27 LAB — URIC ACID: Uric Acid, Serum: 7.3 mg/dL (ref 4.0–8.0)

## 2020-01-27 LAB — COMPLETE METABOLIC PANEL WITH GFR
AG Ratio: 1.3 (calc) (ref 1.0–2.5)
ALT: 18 U/L (ref 9–46)
AST: 18 U/L (ref 10–35)
Albumin: 4.5 g/dL (ref 3.6–5.1)
Alkaline phosphatase (APISO): 45 U/L (ref 35–144)
BUN/Creatinine Ratio: 12 (calc) (ref 6–22)
BUN: 16 mg/dL (ref 7–25)
CO2: 30 mmol/L (ref 20–32)
Calcium: 10.1 mg/dL (ref 8.6–10.3)
Chloride: 104 mmol/L (ref 98–110)
Creat: 1.37 mg/dL — ABNORMAL HIGH (ref 0.70–1.33)
GFR, Est African American: 69 mL/min/{1.73_m2} (ref >=60)
GFR, Est Non African American: 59 mL/min/{1.73_m2} — ABNORMAL LOW (ref >=60)
Globulin: 3.5 g/dL (calc) (ref 1.9–3.7)
Glucose, Bld: 114 mg/dL — ABNORMAL HIGH (ref 65–99)
Potassium: 4.2 mmol/L (ref 3.5–5.3)
Sodium: 141 mmol/L (ref 135–146)
Total Bilirubin: 0.4 mg/dL (ref 0.2–1.2)
Total Protein: 8 g/dL (ref 6.1–8.1)

## 2020-01-27 LAB — CBC WITH DIFFERENTIAL/PLATELET
Absolute Monocytes: 288 cells/uL (ref 200–950)
Basophils Absolute: 58 cells/uL (ref 0–200)
Basophils Relative: 1.2 %
Eosinophils Absolute: 38 cells/uL (ref 15–500)
Eosinophils Relative: 0.8 %
HCT: 38.8 % (ref 38.5–50.0)
Hemoglobin: 12.2 g/dL — ABNORMAL LOW (ref 13.2–17.1)
Lymphs Abs: 1646 cells/uL (ref 850–3900)
MCH: 27.7 pg (ref 27.0–33.0)
MCHC: 31.4 g/dL — ABNORMAL LOW (ref 32.0–36.0)
MCV: 88 fL (ref 80.0–100.0)
MPV: 9.1 fL (ref 7.5–12.5)
Monocytes Relative: 6 %
Neutro Abs: 2770 cells/uL (ref 1500–7800)
Neutrophils Relative %: 57.7 %
Platelets: 509 10*3/uL — ABNORMAL HIGH (ref 140–400)
RBC: 4.41 10*6/uL (ref 4.20–5.80)
RDW: 17.2 % — ABNORMAL HIGH (ref 11.0–15.0)
Total Lymphocyte: 34.3 %
WBC: 4.8 10*3/uL (ref 3.8–10.8)

## 2020-01-27 LAB — SEDIMENTATION RATE: Sed Rate: 14 mm/h (ref 0–20)

## 2020-01-27 LAB — RHEUMATOID FACTOR: Rheumatoid fact SerPl-aCnc: 16 IU/mL — ABNORMAL HIGH (ref <14)

## 2020-01-27 NOTE — Progress Notes (Signed)
I will discuss results at the follow-up visit.

## 2020-02-18 NOTE — Progress Notes (Signed)
 Office Visit Note  Patient: Keith Leblanc             Date of Birth: 12/05/1965           MRN: [id number]             PCP: Nafziger, Cory, NP Referring: Nafziger, Cory, NP Visit Date: 02/23/2020 Occupation: @GUAROCC@  Subjective:  Medication monitoring.   History of Present Illness: Keith Leblanc is a 54 y.o. male with history of gouty arthropathy.  He states he has been doing well on combination of allopurinol and colchicine.  He has not had any gout flare.  He has not made many dietary changes.  He denies discomfort in any of his joints today.  He has not experienced any side effects from the medications.  Activities of Daily Living:  Patient reports morning stiffness for 0 minutes.   Patient Denies nocturnal pain.  Difficulty dressing/grooming: Denies Difficulty climbing stairs: Denies Difficulty getting out of chair: Denies Difficulty using hands for taps, buttons, cutlery, and/or writing: Denies  Review of Systems  Constitutional: Negative for fatigue and night sweats.  HENT: Negative for mouth sores, mouth dryness and nose dryness.   Eyes: Negative for redness, itching and dryness.  Respiratory: Negative for shortness of breath and difficulty breathing.   Cardiovascular: Negative for chest pain, palpitations, hypertension, irregular heartbeat and swelling in legs/feet.  Gastrointestinal: Negative for blood in stool, constipation and diarrhea.  Endocrine: Negative for increased urination.  Genitourinary: Negative for difficulty urinating.  Musculoskeletal: Negative for arthralgias, joint pain, joint swelling, myalgias, muscle weakness, morning stiffness, muscle tenderness and myalgias.  Skin: Negative for color change, rash, hair loss, nodules/bumps, redness, skin tightness, ulcers and sensitivity to sunlight.  Allergic/Immunologic: Negative for susceptible to infections.  Neurological: Negative for dizziness, fainting, numbness, headaches, memory loss, night sweats and  weakness.  Hematological: Negative for bruising/bleeding tendency and swollen glands.  Psychiatric/Behavioral: Negative for depressed mood, confusion and sleep disturbance. The patient is not nervous/anxious.     PMFS History:  Patient Active Problem List   Diagnosis Date Noted  . Hypertriglyceridemia 05/04/2018  . Class 1 drug-induced obesity with serious comorbidity and body mass index (BMI) of 32.0 to 32.9 in adult 01/21/2017  . Type 2 diabetes mellitus with hyperglycemia (HCC) 08/27/2016  . Protein-calorie malnutrition, severe 07/08/2016  . Diabetic ketoacidosis without coma associated with type 2 diabetes mellitus (HCC) 07/07/2016  . Acute renal failure (ARF) (HCC) 07/07/2016  . Hyponatremia 07/07/2016  . Hyperkalemia 07/07/2016  . Obesity (BMI 30-39.9) 07/07/2016  . Tobacco abuse 07/07/2016  . Overweight (BMI 25.0-29.9)   . Blood in stool 05/21/2016  . Essential hypertension 04/28/2015  . Erectile dysfunction 04/28/2015  . Gout 04/28/2015    Past Medical History:  Diagnosis Date  . Blood in stool    bright red blood   . Chicken pox   . Diabetes mellitus (HCC)   . Erectile dysfunction   . Gout   . Hypertension     Family History  Problem Relation Age of Onset  . Alcohol abuse Father   . Hypertension Father   . Heart disease Father   . Gout Father   . Breast cancer Mother   . Hypertension Mother   . Gout Mother   . Healthy Sister   . Healthy Daughter   . Colon cancer Neg Hx   . Esophageal cancer Neg Hx   . Rectal cancer Neg Hx   . Stomach cancer Neg Hx    Past Surgical History:    Procedure Laterality Date  . APPENDECTOMY  2004  . COLONOSCOPY     Social History   Social History Narrative   Works for [employer]    Not married    One daughter who does not live with him    Likes to gamble, walk in the parks, travel.    Immunization History  Administered Date(s) Administered  . PFIZER SARS-COV-2 Vaccination 07/27/2019, 08/17/2019  . Pneumococcal  Polysaccharide-23 04/03/2018     Objective: Vital Signs: BP (!) 162/103 (BP Location: Right Arm, Patient Position: Sitting, Cuff Size: Normal)   Pulse 66   Resp 17   Ht 6' 5" (1.956 m)   Wt 269 lb 12.8 oz (122.4 kg)   BMI 31.99 kg/m    Physical Exam Vitals and nursing note reviewed.  Constitutional:      Appearance: He is well-developed.  HENT:     Head: Normocephalic and atraumatic.  Eyes:     Conjunctiva/sclera: Conjunctivae normal.     Pupils: Pupils are equal, round, and reactive to light.  Cardiovascular:     Rate and Rhythm: Normal rate and regular rhythm.     Heart sounds: Normal heart sounds.  Pulmonary:     Effort: Pulmonary effort is normal.     Breath sounds: Normal breath sounds.  Abdominal:     General: Bowel sounds are normal.     Palpations: Abdomen is soft.  Musculoskeletal:     Cervical back: Normal range of motion and neck supple.  Skin:    General: Skin is warm and dry.     Capillary Refill: Capillary refill takes less than 2 seconds.  Neurological:     Mental Status: He is alert and oriented to person, place, and time.  Psychiatric:        Behavior: Behavior normal.      Musculoskeletal Exam: C-spine thoracic and lumbar spine with good range of motion.  Shoulder joints, elbow joints, wrist joints, MCPs PIPs and DIPs with good range of motion with no synovitis.  Hip joints, knee joints are in good range of motion.  He had no tenderness across MTPs.  CDAI Exam: CDAI Score: -- Patient Global: --; Provider Global: -- Swollen: --; Tender: -- Joint Exam 02/23/2020   No joint exam has been documented for this visit   There is currently no information documented on the homunculus. Go to the Rheumatology activity and complete the homunculus joint exam.  Investigation: No additional findings.  Imaging: XR Elbow 2 Views Left  Result Date: 01/26/2020 No significant elbow joint narrowing was noted.  No erosive changes were noted. Impression:  Unremarkable x-ray of the elbow.  XR Elbow 2 Views Right  Result Date: 01/26/2020 No significant elbow joint narrowing was noted.  Posterior olecranon spur was noted.  No erosive changes were noted. Impression: Unremarkable x-ray of the elbow joint except for spurring.  XR Foot 2 Views Left  Result Date: 01/26/2020 First MTP narrowing and hallux valgus deformity was noted.  PIP and DIP narrowing was noted.  No erosive changes were noted.  Dorsal spurring was noted. Impression: These findings are consistent with osteoarthritis of the foot.  XR Foot 2 Views Right  Result Date: 01/26/2020 Severe first MTP narrowing and subluxation and spurring was noted.  PIP and DIP narrowing was noted.  No erosive changes were noted.  Dorsal spurring was noted.  No tibiotalar joint space was noted.  Posterior calcaneal spur was noted. Impression: These findings are consistent with osteoarthritis of the foot.  XR   Hand 2 View Left  Result Date: 01/26/2020 CMC, PIP and DIP narrowing was noted.  No MCP, intercarpal or radiocarpal joint space narrowing was noted.  No erosive changes were noted. Impression: These findings are consistent with osteoarthritis of the hand.  XR Hand 2 View Right  Result Date: 01/26/2020 CMC, PIP and DIP narrowing was noted.  No MCP, intercarpal or radiocarpal joint space narrowing was noted.  No erosive changes were noted. Impression: These findings are consistent with osteoarthritis of the hand.  XR KNEE 3 VIEW LEFT  Result Date: 01/26/2020 Moderate medial compartment narrowing was noted.  Moderate patellofemoral narrowing was noted.  No chondrocalcinosis was noted. Impression: These findings are consistent with moderate osteoarthritis and moderate chondromalacia patella.  XR KNEE 3 VIEW RIGHT  Result Date: 01/26/2020 Mild medial compartment narrowing was noted.  Moderate patellofemoral narrowing was noted.  No chondrocalcinosis was noted. Impression: These findings are consistent with  mild osteoarthritis and moderate chondromalacia patella.   Recent Labs: Lab Results  Component Value Date   WBC 4.8 01/26/2020   HGB 12.2 (L) 01/26/2020   PLT 509 (H) 01/26/2020   NA 141 01/26/2020   K 4.2 01/26/2020   CL 104 01/26/2020   CO2 30 01/26/2020   GLUCOSE 114 (H) 01/26/2020   BUN 16 01/26/2020   CREATININE 1.37 (H) 01/26/2020   BILITOT 0.4 01/26/2020   ALKPHOS 49 05/27/2019   AST 18 01/26/2020   ALT 18 01/26/2020   PROT 8.0 01/26/2020   ALBUMIN 4.4 05/27/2019   CALCIUM 10.1 01/26/2020   GFRAA 69 01/26/2020   January 26, 2020 RF 16, anti-CCP negative, ESR 14, uric acid 7.3  Speciality Comments: No specialty comments available.  Procedures:  No procedures performed Allergies: Patient has no known allergies.   Assessment / Plan:     Visit Diagnoses: Gouty arthropathy, chronic, without tophi - On allopurinol 300 mg p.o. daily and colchicine 0.6 mg p.o. daily as prophylactic therapy was added.  He has been tolerating his medications well.  He has no joint pain.  He has not had any gout flares since the last visit.  Have advised him to continue on the current combination until his follow-up visit.  If his uric acid stays below 6 persistently we may be able to stop colchicine at the follow-up visit.  Dietary modifications were again emphasized.- Plan: Uric acid  Medication monitoring encounter - Plan: CBC with Differential/Platelet, COMPLETE METABOLIC PANEL WITH GFR today.  Primary osteoarthritis of both hands-joint protection was discussed.  Primary osteoarthritis of both knees - Bilateral moderate chondromalacia patella, right mild osteoarthritis and left moderate osteoarthritis.  He has some stiffness but not much discomfort currently.  Primary osteoarthritis of both feet-doing well.  Essential hypertension-his blood pressures are still elevated.  Have advised him to monitor blood pressure and follow-up with his PCP.  Diabetic ketoacidosis without coma associated  with type 2 diabetes mellitus (HCC)  Hypertriglyceridemia  Former smoker -  1 cigar a day x10 years, quit 2018.  Class 1 drug-induced obesity with serious comorbidity and body mass index (BMI) of 32.0 to 32.9 in adult  He had both COVID-19 vaccines.  Use of mask, social distancing and hand hygiene was emphasized.  He was also advised to get the booster when available.  Orders: Orders Placed This Encounter  Procedures  . CBC with Differential/Platelet  . COMPLETE METABOLIC PANEL WITH GFR  . Uric acid   No orders of the defined types were placed in this encounter.   Follow-Up   Instructions: Return for Gout.    , MD  Note - This record has been created using Dragon software.  Chart creation errors have been sought, but may not always  have been located. Such creation errors do not reflect on  the standard of medical care. 

## 2020-02-23 ENCOUNTER — Encounter: Payer: Self-pay | Admitting: Adult Health

## 2020-02-23 ENCOUNTER — Ambulatory Visit: Payer: Managed Care, Other (non HMO) | Admitting: Rheumatology

## 2020-02-23 ENCOUNTER — Encounter: Payer: Self-pay | Admitting: Rheumatology

## 2020-02-23 ENCOUNTER — Other Ambulatory Visit: Payer: Self-pay

## 2020-02-23 VITALS — BP 162/103 | HR 66 | Resp 17 | Ht 77.0 in | Wt 269.8 lb

## 2020-02-23 DIAGNOSIS — Z87891 Personal history of nicotine dependence: Secondary | ICD-10-CM

## 2020-02-23 DIAGNOSIS — M1A00X0 Idiopathic chronic gout, unspecified site, without tophus (tophi): Secondary | ICD-10-CM | POA: Diagnosis not present

## 2020-02-23 DIAGNOSIS — E781 Pure hyperglyceridemia: Secondary | ICD-10-CM

## 2020-02-23 DIAGNOSIS — Z5181 Encounter for therapeutic drug level monitoring: Secondary | ICD-10-CM | POA: Diagnosis not present

## 2020-02-23 DIAGNOSIS — M19041 Primary osteoarthritis, right hand: Secondary | ICD-10-CM | POA: Diagnosis not present

## 2020-02-23 DIAGNOSIS — M170 Bilateral primary osteoarthritis of knee: Secondary | ICD-10-CM | POA: Diagnosis not present

## 2020-02-23 DIAGNOSIS — M19072 Primary osteoarthritis, left ankle and foot: Secondary | ICD-10-CM

## 2020-02-23 DIAGNOSIS — M19071 Primary osteoarthritis, right ankle and foot: Secondary | ICD-10-CM

## 2020-02-23 DIAGNOSIS — E661 Drug-induced obesity: Secondary | ICD-10-CM

## 2020-02-23 DIAGNOSIS — E1110 Type 2 diabetes mellitus with ketoacidosis without coma: Secondary | ICD-10-CM

## 2020-02-23 DIAGNOSIS — M19042 Primary osteoarthritis, left hand: Secondary | ICD-10-CM

## 2020-02-23 DIAGNOSIS — Z6832 Body mass index (BMI) 32.0-32.9, adult: Secondary | ICD-10-CM

## 2020-02-23 DIAGNOSIS — I10 Essential (primary) hypertension: Secondary | ICD-10-CM

## 2020-02-23 NOTE — Patient Instructions (Signed)
Please get CBC, CMP with GFR and uric acid 3 days prior to your next visit.

## 2020-02-23 NOTE — Addendum Note (Signed)
Addended by: Carole Binning on: 02/23/2020 11:27 AM   Modules accepted: Orders

## 2020-02-24 ENCOUNTER — Other Ambulatory Visit: Payer: Self-pay

## 2020-02-24 LAB — CBC WITH DIFFERENTIAL/PLATELET
Basophils Absolute: 0.1 10*3/uL (ref 0.0–0.2)
Basos: 1 %
EOS (ABSOLUTE): 0.1 10*3/uL (ref 0.0–0.4)
Eos: 1 %
Hematocrit: 36.7 % — ABNORMAL LOW (ref 37.5–51.0)
Hemoglobin: 11.9 g/dL — ABNORMAL LOW (ref 13.0–17.7)
Immature Grans (Abs): 0 10*3/uL (ref 0.0–0.1)
Immature Granulocytes: 0 %
Lymphocytes Absolute: 2.5 10*3/uL (ref 0.7–3.1)
Lymphs: 50 %
MCH: 29 pg (ref 26.6–33.0)
MCHC: 32.4 g/dL (ref 31.5–35.7)
MCV: 89 fL (ref 79–97)
Monocytes Absolute: 0.4 10*3/uL (ref 0.1–0.9)
Monocytes: 9 %
Neutrophils Absolute: 2 10*3/uL (ref 1.4–7.0)
Neutrophils: 39 %
Platelets: 345 10*3/uL (ref 150–450)
RBC: 4.11 x10E6/uL — ABNORMAL LOW (ref 4.14–5.80)
RDW: 15.9 % — ABNORMAL HIGH (ref 11.6–15.4)
WBC: 5 10*3/uL (ref 3.4–10.8)

## 2020-02-24 LAB — CMP14+EGFR
ALT: 19 IU/L (ref 0–44)
AST: 21 IU/L (ref 0–40)
Albumin/Globulin Ratio: 1.7 (ref 1.2–2.2)
Albumin: 4.7 g/dL (ref 3.8–4.9)
Alkaline Phosphatase: 39 IU/L — ABNORMAL LOW (ref 48–121)
BUN/Creatinine Ratio: 9 (ref 9–20)
BUN: 12 mg/dL (ref 6–24)
Bilirubin Total: 0.5 mg/dL (ref 0.0–1.2)
CO2: 30 mmol/L — ABNORMAL HIGH (ref 20–29)
Calcium: 9.7 mg/dL (ref 8.7–10.2)
Chloride: 100 mmol/L (ref 96–106)
Creatinine, Ser: 1.27 mg/dL (ref 0.76–1.27)
GFR calc Af Amer: 75 mL/min/{1.73_m2} (ref >59)
GFR calc non Af Amer: 65 mL/min/{1.73_m2} (ref >59)
Globulin, Total: 2.7 g/dL (ref 1.5–4.5)
Glucose: 95 mg/dL (ref 65–99)
Potassium: 4.3 mmol/L (ref 3.5–5.2)
Sodium: 141 mmol/L (ref 134–144)
Total Protein: 7.4 g/dL (ref 6.0–8.5)

## 2020-02-24 LAB — URIC ACID: Uric Acid: 5 mg/dL (ref 3.8–8.4)

## 2020-02-24 MED ORDER — BD PEN NEEDLE MINI U/F 31G X 5 MM MISC: 11 refills | Status: DC

## 2020-02-29 NOTE — Progress Notes (Signed)
P is normal

## 2020-03-05 LAB — PHOSPHORUS: Phosphorus: 3.6 mg/dL (ref 2.8–4.1)

## 2020-03-05 LAB — SPECIMEN STATUS REPORT

## 2020-03-06 ENCOUNTER — Ambulatory Visit: Payer: Managed Care, Other (non HMO) | Admitting: Internal Medicine

## 2020-03-07 ENCOUNTER — Other Ambulatory Visit: Payer: Self-pay | Admitting: Adult Health

## 2020-03-07 DIAGNOSIS — N529 Male erectile dysfunction, unspecified: Secondary | ICD-10-CM

## 2020-03-13 ENCOUNTER — Other Ambulatory Visit: Payer: Self-pay

## 2020-03-13 ENCOUNTER — Ambulatory Visit (INDEPENDENT_AMBULATORY_CARE_PROVIDER_SITE_OTHER): Payer: Managed Care, Other (non HMO) | Admitting: Internal Medicine

## 2020-03-13 ENCOUNTER — Encounter: Payer: Self-pay | Admitting: Internal Medicine

## 2020-03-13 VITALS — BP 130/90 | HR 83 | Ht 76.0 in | Wt 268.0 lb

## 2020-03-13 DIAGNOSIS — E781 Pure hyperglyceridemia: Secondary | ICD-10-CM

## 2020-03-13 DIAGNOSIS — E661 Drug-induced obesity: Secondary | ICD-10-CM

## 2020-03-13 DIAGNOSIS — E1165 Type 2 diabetes mellitus with hyperglycemia: Secondary | ICD-10-CM

## 2020-03-13 DIAGNOSIS — Z794 Long term (current) use of insulin: Secondary | ICD-10-CM | POA: Diagnosis not present

## 2020-03-13 DIAGNOSIS — Z6832 Body mass index (BMI) 32.0-32.9, adult: Secondary | ICD-10-CM

## 2020-03-13 LAB — POCT GLYCOSYLATED HEMOGLOBIN (HGB A1C): Hemoglobin A1C: 5.3 % (ref 4.0–5.6)

## 2020-03-13 NOTE — Addendum Note (Signed)
Addended by: Cardell Peach I on: 03/13/2020 11:16 AM   Modules accepted: Orders

## 2020-03-13 NOTE — Patient Instructions (Addendum)
Please continue: - Trulicity 1.5 mg weekly - Lantus 24 units in a.m.  Please schedule a new eye exam.  Please return in 6 months with your sugar log.

## 2020-03-13 NOTE — Progress Notes (Signed)
Patient ID: Keith Leblanc, male   DOB: Oct 23, 1965, 54 y.o.   MRN: [id number]   This visit occurred during the SARS-CoV-2 public health emergency.  Safety protocols were in place, including screening questions prior to the visit, additional usage of staff PPE, and extensive cleaning of exam room while observing appropriate contact time as indicated for disinfecting solutions.   HPI: Keith Leblanc is a 54 y.o.-year-old male, returning for f/u for  DM2, dx in 06/2016 (prev. Prediabetes since 2015-16), insulin-dependent, uncontrolled, without long term complications. Last visit 6 months ago.  He was dx'ed with gouty arthritis - knees, elbows, fingers. Sees rheumatology. No need for steroids anymore.  Reviewed history: Patient was diagnosed with diabetes in 07/07/2016, when he presented to the hospital in DKA. An A1c was 12%. His CBG was 1081. Retrospectively, he had increased urination, increased thirst, blurred vision, fatigue, rapid weight loss of 25 lbs. He had a lot of stress at work + drinking A LOT of sweet drinks (4 cans of soda a day + gatorade + juice). He is now off these but still working on decreasing Alcohol - 4-5 beers a day.  He was started on insulin inhouse, and his CBG is greatly improved afterwards. Less than a month later, his HbA1c decreased to 9.6%.  Reviewed HbA1c levels: Lab Results  Component Value Date   HGBA1C 6.5 (A) 08/31/2019   HGBA1C 6.0 (A) 09/01/2018   HGBA1C 6.7 (A) 05/04/2018   Pt was on a regimen of: - Metformin 500 mg 0-1x a day with dinner >> could not tolerate this 2/2 nausea - Basaglar 40 units at bedtime - takes 2-3x a week - Novolog 6 units 3x a day, before meals >> stopped 01/6577  Now on: - Trulicity 4.69 >> 1.5 mg weekly - Lantus 30 >> 24 units daily in am  Pt checks his sugars once a day: - am:  115-135, 199 >> 95-137 >> 120-145 >> 160-165 >> 110-140, 160 - 2h after b'fast: 107-122 >> n/c - before lunch: 97-134 >> ? >> n/c >> 120-145 >> n/c  >> 130-140 (possibly after a snack) - 2h after lunch: n/c - before dinner: 94, 126 >> ? >> n/c >> 140-175 >> 120  - 2h after dinner: n/c - bedtime: n/c >> 89-100 >> n/c - nighttime: n/c Lowest sugar was 93 >> 89 >> 120 >> 100; it is unclear at which level he has hypoglycemia awareness. Highest sugar was 400s x1, 190 >> 180 >> 120-140 >> 160. He was in the ED 05/2018 for hyperglycemia (CBG 400s >> ED: 361). He was having blurry vision then.  Glucometer: One Touch Ultra  Pt's meals are: - Breakfast: 2-3 boiled eggs, sausage, cheese, diet tea - Lunch: salad, chicken or pork, coke zero - Dinner: sea food + veggies + brown rice, coke zero - Snacks: water He continues to drink alcohol every night: 1 pint (almost 0.5 L) but switched from whiskey to tequila.  -No history of CKD: Lab Results  Component Value Date   BUN 12 02/23/2020   BUN 16 01/26/2020   CREATININE 1.27 02/23/2020   CREATININE 1.37 (H) 01/26/2020   -+ HL: Lab Results  Component Value Date   CHOL 229 (H) 05/27/2019   HDL 55.30 05/27/2019   LDLCALC 143 (H) 08/27/2016   LDLDIRECT 106.0 05/27/2019   TRIG (H) 05/27/2019    481.0 Triglyceride is over 400; calculations on Lipids are invalid.   CHOLHDL 4 05/27/2019  On Lipitor.  - last eye exam was  in 2019: No DR -No numbness and tingling in his feet.  ROS: Constitutional: no weight gain/no weight loss, no fatigue, no subjective hyperthermia, no subjective hypothermia Eyes: no blurry vision, no xerophthalmia ENT: no sore throat, no nodules palpated in neck, no dysphagia, no odynophagia, no hoarseness Cardiovascular: no CP/no SOB/no palpitations/no leg swelling Respiratory: no cough/no SOB/no wheezing Gastrointestinal: no N/no V/no D/no C/no acid reflux Musculoskeletal: no muscle aches/no joint aches Skin: no rashes, no hair loss Neurological: + tremors/no numbness/no tingling/no dizziness  I reviewed pt's medications, allergies, PMH, social hx, family hx, and  changes were documented in the history of present illness. Otherwise, unchanged from my initial visit note.  Past Medical History:  Diagnosis Date  . Blood in stool    bright red blood   . Chicken pox   . Diabetes mellitus ([place])   . Erectile dysfunction   . Gout   . Hypertension    Past Surgical History:  Procedure Laterality Date  . APPENDECTOMY  2004  . COLONOSCOPY     Social History   Social History  . Marital status: Single    Spouse name: N/A  . Number of children: 1 - 47 y/o in 2018   Occupational History  . Customer Therapist, nutritional    Social History Main Topics  . Smoking status: Former Research scientist (life sciences)  . Smokeless tobacco: Started to chew tobacco   . Alcohol use      Comment: 4-5 beers a day   . Drug use: Yes    Types: Marijuana  . Sexual activity: Yes   Social History Narrative   Works for [employer]    Not married    One daughter who does not live with him    Likes to gamble, walk in the parks, travel.    Current Outpatient Medications on File Prior to Visit  Medication Sig Dispense Refill  . allopurinol (ZYLOPRIM) 300 MG tablet Take 1 tablet (300 mg total) by mouth daily. 90 tablet 1  . amLODipine (NORVASC) 10 MG tablet TAKE 1 TABLET BY MOUTH EVERY DAY 30 tablet 8  . blood glucose meter kit and supplies Dispense based on patient and insurance preference. Use up to four times daily as directed. (FOR ICD-9 250.00, 250.01). 1 each 0  . buPROPion (WELLBUTRIN XL) 300 MG 24 hr tablet TAKE 1 TABLET BY MOUTH  DAILY 90 tablet 1  . BYSTOLIC 10 MG tablet TAKE 1 TABLET BY MOUTH  DAILY 90 tablet 3  . colchicine 0.6 MG tablet Take colchicine 0.6 mg, 1 tablet p.o. daily.  Take colchicine 0.6 mg p.o. twice daily for flares. 60 tablet 2  . indomethacin (INDOCIN) 50 MG capsule Take 1 capsule (50 mg total) by mouth 3 (three) times daily as needed. 30 capsule 0  . Insulin Glargine (LANTUS SOLOSTAR) 100 UNIT/ML Solostar Pen INJECT SUBCUTANEOUSLY 30  UNITS DAILY AT 10PM 30 mL 3  . Insulin  Pen Needle (B-D UF III MINI PEN NEEDLES) 31G X 5 MM MISC USE 4 TIMES DAILY AFTER MEALS AND AT BEDTIME 100 each 11  . Lancets (ONETOUCH ULTRASOFT) lancets Test blood sugars as directed. Dx E11.9 100 each 12  . omeprazole (PRILOSEC) 40 MG capsule TAKE 1 CAPSULE BY MOUTH  DAILY 30 capsule 0  . ONE TOUCH ULTRA TEST test strip USE UP TO 4 TIMES DAILY AS DIRECTED 100 each 3  . sildenafil (REVATIO) 20 MG tablet TAKE 2-5 TABLETS BY MOUTH AS NEEDED FOR SEXUAL ACTIVITY 50 tablet 2  . TRULICITY 1.5  MG/0.5ML SOPN INJECT THE CONTENTS OF 1  PEN SUBCUTANEOUSLY ONCE A  WEEK 6 mL 3   No current facility-administered medications on file prior to visit.   No Known Allergies Family History  Problem Relation Age of Onset  . Alcohol abuse Father   . Hypertension Father   . Heart disease Father   . Gout Father   . Breast cancer Mother   . Hypertension Mother   . Gout Mother   . Healthy Sister   . Healthy Daughter   . Colon cancer Neg Hx   . Esophageal cancer Neg Hx   . Rectal cancer Neg Hx   . Stomach cancer Neg Hx   Pt has FH of DM in mother.  PE: BP 130/90   Pulse 83   Ht 6\' 4"  (1.93 m)   Wt 268 lb (121.6 kg)   SpO2 97%   BMI 32.62 kg/m  Wt Readings from Last 3 Encounters:  03/13/20 268 lb (121.6 kg)  02/23/20 269 lb 12.8 oz (122.4 kg)  01/26/20 270 lb (122.5 kg)   Constitutional: overweight, in NAD Eyes: PERRLA, EOMI, no exophthalmos ENT: moist mucous membranes, no thyromegaly, no cervical lymphadenopathy Cardiovascular: RRR, No MRG Respiratory: CTA B Gastrointestinal: abdomen soft, NT, ND, BS+ Musculoskeletal: no deformities, strength intact in all 4 Skin: moist, warm, no rashes Neurological: + Mild tremor with outstretched hands, DTR normal in all 4  ASSESSMENT: 1. DM2, -insulin-dependent, uncontrolled, without long term complications, but with hyperglycemia  He has type 2, rather than type 1 diabetes: Component     Latest Ref Rng & Units 08/27/2016  Glucose     70 - 99 mg/dL 10/27/2016  (H)  Pancreatic Islet Cell Antibody     <5 JDF Units <5  Glutamic Acid Decarb Ab     <5 IU/mL <5  C-Peptide     0.80 - 3.85 ng/mL 1.45   2. Obesity class 1 BMI Classification:  < 18.5 underweight   18.5-24.9 normal weight   25.0-29.9 overweight   30.0-34.9 class I obesity   35.0-39.9 class II obesity   ? 40.0 class III obesity   3.  Hypertriglyceridemia  PLAN:  1. Patient with history of uncontrolled diabetes, on basal insulin and GLP-1 receptor agonist.  In 2019, he started to improve his diet and reduce alcohol and his HbA1c improved significantly to 6.0%.  However, he was then lost for follow-up for a year.  At last visit, 6 months ago, his sugars were higher at home compared to the previous visit but HbA1c was not much increase, at 6.5%.  Since he was not checking sugars, I strongly advised him to start checking but we did not change his regimen. -At today's visit, sugars are improved in the morning and they are mostly at goal but occasionally slightly above goal later in the day.  However, his HbA1c: 5.3% (better).  This is lower than expected from his log.  He does not report any lows.  He does not think that his test strips are old.  He is not on iron.  He did not have any blood transfusion.  He is on colchicine, which can lower his blood sugars, but again, blood sugars are not as low as expected from the A1c.  For now, I advised him to continue the same regimen, we discussed about what constitutes a low blood sugar and advised him to be in touch with me if he experiences any lows, in which case, will need to reduce his  Lantus dose. - I suggested to:  Patient Instructions  Please continue: - Trulicity 1.5 mg weekly - Lantus 24 units in a.m.  Please schedule a new eye exam.  Please return in 6 months with your sugar log.   - advised to check sugars at different times of the day - 1x a day, rotating check times - advised for yearly eye exams >> he is not UTD -advised him  to schedule - also, advised him to try to reduce and stop alcohol - return to clinic in 6 months  2. Obesity class 1 -Continue Trulicity which should also help with weight loss -Weight at last visit: 272 >> 268 lbs (lost 4 lbs)  3. HTG -We discussed in the past about improving diet and at today's visit we again discussed about reducing/stopping alcohol -Reviewed latest lipid panel from 05/2019: LDL improved, triglycerides high, HDL at goal: Lab Results  Component Value Date   CHOL 229 (H) 05/27/2019   HDL 55.30 05/27/2019   LDLCALC 143 (H) 08/27/2016   LDLDIRECT 106.0 05/27/2019   TRIG (H) 05/27/2019    481.0 Triglyceride is over 400; calculations on Lipids are invalid.   CHOLHDL 4 05/27/2019  -He was started on Lipitor per PCP after the above results returned.  No side effects from the statin.  Philemon Kingdom, MD PhD [facility] Endocrinology

## 2020-04-04 ENCOUNTER — Other Ambulatory Visit: Payer: Self-pay | Admitting: *Deleted

## 2020-04-04 MED ORDER — AMLODIPINE BESYLATE 10 MG PO TABS: 10.0000 mg | ORAL_TABLET | Freq: Every day | ORAL | 0 refills | Status: DC

## 2020-04-04 NOTE — Telephone Encounter (Signed)
Rx done. 

## 2020-04-05 ENCOUNTER — Telehealth: Payer: Self-pay | Admitting: Adult Health

## 2020-04-05 NOTE — Telephone Encounter (Signed)
Pt call and want a refill on omeprazole (PRILOSEC) 40 MG capsule sent to [facility].

## 2020-04-06 MED ORDER — OMEPRAZOLE 40 MG PO CPDR: 40.0000 mg | DELAYED_RELEASE_CAPSULE | Freq: Every day | ORAL | 1 refills | Status: DC

## 2020-04-07 ENCOUNTER — Other Ambulatory Visit: Payer: Self-pay | Admitting: Adult Health

## 2020-04-20 ENCOUNTER — Other Ambulatory Visit: Payer: Self-pay | Admitting: Adult Health

## 2020-04-20 DIAGNOSIS — M1A49X0 Other secondary chronic gout, multiple sites, without tophus (tophi): Secondary | ICD-10-CM

## 2020-04-29 ENCOUNTER — Other Ambulatory Visit: Payer: Self-pay | Admitting: Gastroenterology

## 2020-04-29 ENCOUNTER — Other Ambulatory Visit: Payer: Self-pay | Admitting: Rheumatology

## 2020-05-01 ENCOUNTER — Other Ambulatory Visit: Payer: Self-pay | Admitting: Internal Medicine

## 2020-05-01 NOTE — Telephone Encounter (Signed)
Last Visit: 02/23/2020 Next Visit: 05/26/2020 Labs: 02/23/2020 RBC count, hgb, and hct are low and trending down. Alk phos is low. Uric acid is within the desirable range. P is normal  Current Dose per office note 02/23/2020: colchicine 0.6 mg p.o. daily  DX: colchicine 0.6 mg p.o. daily   Okay to refill Colchcine?

## 2020-05-12 NOTE — Progress Notes (Deleted)
Office Visit Note  Patient: Keith Leblanc             Date of Birth: [birth year]           MRN: [id number]             PCP: Dorothyann Peng, NP Referring: Dorothyann Peng, NP Visit Date: 05/26/2020 Occupation: @GUAROCC @  Subjective:  No chief complaint on file.   History of Present Illness: Keith Leblanc is a 54 y.o. male ***   Activities of Daily Living:  Patient reports morning stiffness for *** {minute/hour:19697}.   Patient {ACTIONS;DENIES/REPORTS:21021675::"Denies"} nocturnal pain.  Difficulty dressing/grooming: {ACTIONS;DENIES/REPORTS:21021675::"Denies"} Difficulty climbing stairs: {ACTIONS;DENIES/REPORTS:21021675::"Denies"} Difficulty getting out of chair: {ACTIONS;DENIES/REPORTS:21021675::"Denies"} Difficulty using hands for taps, buttons, cutlery, and/or writing: {ACTIONS;DENIES/REPORTS:21021675::"Denies"}  No Rheumatology ROS completed.   PMFS History:  Patient Active Problem List   Diagnosis Date Noted  . Hypertriglyceridemia 05/04/2018  . Class 1 drug-induced obesity with serious comorbidity and body mass index (BMI) of 32.0 to 32.9 in adult 01/21/2017  . Type 2 diabetes mellitus with hyperglycemia ([place]) 08/27/2016  . Protein-calorie malnutrition, severe 07/08/2016  . Diabetic ketoacidosis without coma associated with type 2 diabetes mellitus ([place]) 07/07/2016  . Acute renal failure (ARF) ([place]) 07/07/2016  . Hyponatremia 07/07/2016  . Hyperkalemia 07/07/2016  . Obesity (BMI 30-39.9) 07/07/2016  . Tobacco abuse 07/07/2016  . Overweight (BMI 25.0-29.9)   . Blood in stool 05/21/2016  . Essential hypertension 04/28/2015  . Erectile dysfunction 04/28/2015  . Gout 04/28/2015    Past Medical History:  Diagnosis Date  . Blood in stool    bright red blood   . Chicken pox   . Diabetes mellitus ([place])   . Erectile dysfunction   . Gout   . Hypertension     Family History  Problem Relation Age of Onset  . Alcohol abuse Father   . Hypertension Father   .  Heart disease Father   . Gout Father   . Breast cancer Mother   . Hypertension Mother   . Gout Mother   . Healthy Sister   . Healthy Daughter   . Colon cancer Neg Hx   . Esophageal cancer Neg Hx   . Rectal cancer Neg Hx   . Stomach cancer Neg Hx    Past Surgical History:  Procedure Laterality Date  . APPENDECTOMY  2004  . COLONOSCOPY     Social History   Social History Narrative   Works for [employer]    Not married    One daughter who does not live with him    Likes to gamble, walk in the parks, travel.    Immunization History  Administered Date(s) Administered  . PFIZER SARS-COV-2 Vaccination 07/27/2019, 08/17/2019  . Pneumococcal Polysaccharide-23 04/03/2018     Objective: Vital Signs: There were no vitals taken for this visit.   Physical Exam   Musculoskeletal Exam: ***  CDAI Exam: CDAI Score: -- Patient Global: --; Provider Global: -- Swollen: --; Tender: -- Joint Exam 05/26/2020   No joint exam has been documented for this visit   There is currently no information documented on the homunculus. Go to the Rheumatology activity and complete the homunculus joint exam.  Investigation: No additional findings.  Imaging: No results found.  Recent Labs: Lab Results  Component Value Date   WBC 5.0 02/23/2020   HGB 11.9 (L) 02/23/2020   PLT 345 02/23/2020   NA 141 02/23/2020   K 4.3 02/23/2020   CL 100 02/23/2020   CO2 30 (H) 02/23/2020  GLUCOSE 95 02/23/2020   BUN 12 02/23/2020   CREATININE 1.27 02/23/2020   BILITOT 0.5 02/23/2020   ALKPHOS 39 (L) 02/23/2020   AST 21 02/23/2020   ALT 19 02/23/2020   PROT 7.4 02/23/2020   ALBUMIN 4.7 02/23/2020   CALCIUM 9.7 02/23/2020   GFRAA 75 02/23/2020    Speciality Comments: No specialty comments available.  Procedures:  No procedures performed Allergies: Patient has no known allergies.   Assessment / Plan:     Visit Diagnoses: No diagnosis found.  Orders: No orders of the defined types were placed in  this encounter.  No orders of the defined types were placed in this encounter.   Face-to-face time spent with patient was *** minutes. Greater than 50% of time was spent in counseling and coordination of care.  Follow-Up Instructions: No follow-ups on file.   Earnestine Mealing, CMA  Note - This record has been created using Editor, commissioning.  Chart creation errors have been sought, but may not always  have been located. Such creation errors do not reflect on  the standard of medical care.

## 2020-05-26 ENCOUNTER — Ambulatory Visit: Payer: Managed Care, Other (non HMO) | Admitting: Rheumatology

## 2020-05-26 DIAGNOSIS — E661 Drug-induced obesity: Secondary | ICD-10-CM

## 2020-05-26 DIAGNOSIS — I10 Essential (primary) hypertension: Secondary | ICD-10-CM

## 2020-05-26 DIAGNOSIS — Z87891 Personal history of nicotine dependence: Secondary | ICD-10-CM

## 2020-05-26 DIAGNOSIS — E781 Pure hyperglyceridemia: Secondary | ICD-10-CM

## 2020-05-26 DIAGNOSIS — M19041 Primary osteoarthritis, right hand: Secondary | ICD-10-CM

## 2020-05-26 DIAGNOSIS — M170 Bilateral primary osteoarthritis of knee: Secondary | ICD-10-CM

## 2020-05-26 DIAGNOSIS — M1A00X0 Idiopathic chronic gout, unspecified site, without tophus (tophi): Secondary | ICD-10-CM

## 2020-05-26 DIAGNOSIS — E1110 Type 2 diabetes mellitus with ketoacidosis without coma: Secondary | ICD-10-CM

## 2020-05-26 DIAGNOSIS — M19071 Primary osteoarthritis, right ankle and foot: Secondary | ICD-10-CM

## 2020-06-10 ENCOUNTER — Ambulatory Visit: Payer: Managed Care, Other (non HMO) | Attending: Internal Medicine

## 2020-06-10 DIAGNOSIS — Z23 Encounter for immunization: Secondary | ICD-10-CM

## 2020-06-10 NOTE — Progress Notes (Signed)
   Covid-19 Vaccination Clinic  Name:  Keith Leblanc    MRN: [id number] DOB: [birth year]  06/10/2020  Keith Leblanc was observed post Covid-19 immunization for 15 minutes without incident. He was provided with Vaccine Information Sheet and instruction to access the V-Safe system.   Keith Leblanc was instructed to call 911 with any severe reactions post vaccine: Marland Kitchen Difficulty breathing  . Swelling of face and throat  . A fast heartbeat  . A bad rash all over body  . Dizziness and weakness   Immunizations Administered    Name Date Dose VIS Date Route   Pfizer COVID-19 Vaccine 06/10/2020 10:17 AM 0.3 mL 04/12/2020 Intramuscular   Manufacturer: [place]   Lot: [id number]   [place]: [id number]

## 2020-06-27 ENCOUNTER — Other Ambulatory Visit: Payer: Self-pay | Admitting: Adult Health

## 2020-06-28 NOTE — Telephone Encounter (Signed)
SENT TO THE PHARMACY BY E-SCRIBE FOR 30 DAYS.  PT IS PAST DUE FOR YEARLY PHYSICAL.  NOTHING FURTHER NEEDED.

## 2020-07-02 ENCOUNTER — Other Ambulatory Visit: Payer: Self-pay | Admitting: Physician Assistant

## 2020-07-03 NOTE — Telephone Encounter (Signed)
Please call patient and schedule appt, message sent to front desk to schedule appt. Return in about 3 months (around 05/24/2020) for Gout.

## 2020-07-03 NOTE — Telephone Encounter (Signed)
I LMOM for patient to call, and schedule a follow up appointment with Dr. Estanislado Pandy.

## 2020-07-03 NOTE — Telephone Encounter (Signed)
Last Visit: 02/23/2020,   Next Visit: None scheduled, message sent to front desk to schedule appt. Return in about 3 months (around 05/24/2020) for Gout.   Labs: 02/23/2020 uric acid 5.0, RBC count, hgb, and hct are low and trending down. Alk phos is low. Uric acid is within the desirable range. P is normal  Current Dose per office note 02/23/2020,colchicine 0.6 mg p.o. daily as prophylactic therapy was added   DX: Gouty arthropathy, chronic, without tophi   Okay to refill Colchicine?

## 2020-07-05 NOTE — Progress Notes (Signed)
Office Visit Note  Patient: Keith Leblanc             Date of Birth: 1966-06-22           MRN: [id number]             PCP: Dorothyann Peng, NP Referring: Dorothyann Peng, NP Visit Date: 07/13/2020 Occupation: @GUAROCC @  Subjective:  Medication monitoring   History of Present Illness: Keith Leblanc is a 55 y.o. male with history of gout and osteoarthritis.  He is taking allopurinol 300 mg 1 tablet by mouth daily and colchicine 0.6 mg 1 capsule daily.  He is tolerating both medications without any side effects.  He denies any recent gout flares.  He denies any tophi.  He has not had any joint pain or joint swelling.  He has no morning stiffness or difficulty with ADLs.  He has been sleeping well at night.  He denies any new questions or concerns.  Activities of Daily Living:  Patient reports morning stiffness for 0 minutes.   Patient Denies nocturnal pain.  Difficulty dressing/grooming: Denies Difficulty climbing stairs: Denies Difficulty getting out of chair: Denies Difficulty using hands for taps, buttons, cutlery, and/or writing: Denies  Review of Systems  Constitutional: Negative for fatigue and night sweats.  HENT: Negative for mouth sores, mouth dryness and nose dryness.   Eyes: Negative for redness and dryness.  Respiratory: Negative for shortness of breath and difficulty breathing.   Cardiovascular: Negative for chest pain, palpitations, hypertension, irregular heartbeat and swelling in legs/feet.  Gastrointestinal: Negative for constipation and diarrhea.  Endocrine: Negative for increased urination.  Genitourinary: Negative for painful urination.  Musculoskeletal: Negative for arthralgias, joint pain, joint swelling, myalgias, muscle weakness, morning stiffness, muscle tenderness and myalgias.  Skin: Negative for color change, rash, hair loss, nodules/bumps, skin tightness, ulcers and sensitivity to sunlight.  Allergic/Immunologic: Negative for susceptible to infections.   Neurological: Negative for dizziness, fainting, memory loss, night sweats and weakness.  Hematological: Negative for swollen glands.  Psychiatric/Behavioral: Positive for depressed mood and sleep disturbance. Negative for confusion. The patient is nervous/anxious.     PMFS History:  Patient Active Problem List   Diagnosis Date Noted  . Hypertriglyceridemia 05/04/2018  . Class 1 drug-induced obesity with serious comorbidity and body mass index (BMI) of 32.0 to 32.9 in adult 01/21/2017  . Type 2 diabetes mellitus with hyperglycemia ([place]) 08/27/2016  . Protein-calorie malnutrition, severe 07/08/2016  . Diabetic ketoacidosis without coma associated with type 2 diabetes mellitus ([place]) 07/07/2016  . Acute renal failure (ARF) ([place]) 07/07/2016  . Hyponatremia 07/07/2016  . Hyperkalemia 07/07/2016  . Obesity (BMI 30-39.9) 07/07/2016  . Tobacco abuse 07/07/2016  . Overweight (BMI 25.0-29.9)   . Blood in stool 05/21/2016  . Essential hypertension 04/28/2015  . Erectile dysfunction 04/28/2015  . Gout 04/28/2015    Past Medical History:  Diagnosis Date  . Blood in stool    bright red blood   . Chicken pox   . Diabetes mellitus ([place])   . Erectile dysfunction   . Gout   . Hypertension     Family History  Problem Relation Age of Onset  . Alcohol abuse Father   . Hypertension Father   . Heart disease Father   . Gout Father   . Breast cancer Mother   . Hypertension Mother   . Gout Mother   . Healthy Sister   . Healthy Daughter   . Colon cancer Neg Hx   . Esophageal cancer Neg Hx   .  Rectal cancer Neg Hx   . Stomach cancer Neg Hx    Past Surgical History:  Procedure Laterality Date  . APPENDECTOMY  2004  . COLONOSCOPY    . TOOTH EXTRACTION  07/06/2020   Social History   Social History Narrative   Works for [employer]    Not married    One daughter who does not live with him    Likes to gamble, walk in the parks, travel.    Immunization History  Administered Date(s)  Administered  . PFIZER(Purple Top)SARS-COV-2 Vaccination 07/27/2019, 08/17/2019, 06/10/2020  . Pneumococcal Polysaccharide-23 04/03/2018     Objective: Vital Signs: BP (!) 157/116 (BP Location: Left Arm, Patient Position: Sitting, Cuff Size: Large)   Pulse 72   Resp 17   Ht $R'6\' 5"'UY$  (1.956 m)   Wt 261 lb 9.6 oz (118.7 kg)   BMI 31.02 kg/m    Physical Exam Vitals and nursing note reviewed.  Constitutional:      Appearance: He is well-developed and well-nourished.  HENT:     Head: Normocephalic and atraumatic.  Eyes:     Extraocular Movements: EOM normal.     Conjunctiva/sclera: Conjunctivae normal.     Pupils: Pupils are equal, round, and reactive to light.  Pulmonary:     Effort: Pulmonary effort is normal.  Abdominal:     Palpations: Abdomen is soft.  Musculoskeletal:     Cervical back: Normal range of motion and neck supple.  Skin:    General: Skin is warm and dry.     Capillary Refill: Capillary refill takes less than 2 seconds.  Neurological:     Mental Status: He is alert and oriented to person, place, and time.  Psychiatric:        Mood and Affect: Mood and affect normal.        Behavior: Behavior normal.      Musculoskeletal Exam: C-spine, thoracic spine, and lumbar spine good ROM.  Shoulder joints, elbow joints, wrist joints, MCPs, PIPs, and DIPs good ROM with no synovitis.  Complete fist formation bilaterally.  Hip joints have good range of motion with no discomfort.  Knee joints good ROM with no discomfort.  No warmth or effusion of knee joints.  No tenderness or swelling of ankle joints.   CDAI Exam: CDAI Score: -- Patient Global: --; Provider Global: -- Swollen: --; Tender: -- Joint Exam 07/13/2020   No joint exam has been documented for this visit   There is currently no information documented on the homunculus. Go to the Rheumatology activity and complete the homunculus joint exam.  Investigation: No additional findings.  Imaging: No results  found.  Recent Labs: Lab Results  Component Value Date   WBC 5.0 02/23/2020   HGB 11.9 (L) 02/23/2020   PLT 345 02/23/2020   NA 141 02/23/2020   K 4.3 02/23/2020   CL 100 02/23/2020   CO2 30 (H) 02/23/2020   GLUCOSE 95 02/23/2020   BUN 12 02/23/2020   CREATININE 1.27 02/23/2020   BILITOT 0.5 02/23/2020   ALKPHOS 39 (L) 02/23/2020   AST 21 02/23/2020   ALT 19 02/23/2020   PROT 7.4 02/23/2020   ALBUMIN 4.7 02/23/2020   CALCIUM 9.7 02/23/2020   GFRAA 75 02/23/2020    Speciality Comments: No specialty comments available.  Procedures:  No procedures performed Allergies: Patient has no known allergies.   Assessment / Plan:     Visit Diagnoses: Gouty arthropathy, chronic, without tophi - He has not had any recent gout flares.  He is clinically doing well on allopurinol 300 mg 1 tablet by mouth daily and colchicine 0.6 mg p.o. daily.  He is tolerating both medications without any side effects.  He is not experiencing any joint pain and has no inflammation on exam.  No tophi were present.  We discussed the importance of following a low purine diet and avoiding high fructose corn syrup.  We also discussed a list of natural anti-inflammatories which she can start taking.  His uric acid level was 5.0 on 02/23/2020.  We will recheck uric acid, CBC, and CMP.  He will continue taking allopurinol as prescribed.  He does not need a refill at this time.  He was advised to notify us if he develops signs or symptoms of a gout flare.  He will follow-up in the office in 6 months.  Medication monitoring encounter -CBC, CMP, and uric acid level will be checked today.  Orders for [facility] were provided to the patient.  He will continue taking allopurinol and colchicine as prescribed.  He does not need any refills at this time.  Plan: CBC with Differential/Platelet, CMP14+EGFR, CANCELED: CBC with Differential/Platelet, CANCELED: COMPLETE METABOLIC PANEL WITH GFR, CANCELED: Uric acid  Primary osteoarthritis of  both hands: He has PIP and DIP thickening consistent with osteoarthritis of both hands.  No joint tenderness or inflammation was noted.  He is able to make a complete fist bilaterally.  He has no discomfort in his hands at this time.  He was given a list of natural anti-inflammatories to start taking.  Primary osteoarthritis of both knees - Bilateral moderate chondromalacia patella, right mild osteoarthritis and left moderate osteoarthritis.  He has good range of motion of both knee joints with no discomfort.  No warmth or effusion was noted.  Primary osteoarthritis of both feet: He has no discomfort in his feet at this time.  He wears proper fitting shoes.  Other medical conditions are listed as follows:   Essential hypertension  Hypertriglyceridemia  Diabetic ketoacidosis without coma associated with type 2 diabetes mellitus ([place])  Former smoker - 1 cigar a day x10 years, quit 2018.  Class 1 drug-induced obesity with serious comorbidity and body mass index (BMI) of 32.0 to 32.9 in adult  Orders: Orders Placed This Encounter  Procedures  . CBC with Differential/Platelet  . Uric acid  . CMP14+EGFR   No orders of the defined types were placed in this encounter.     Follow-Up Instructions: Return in about 6 months (around 01/10/2021) for Gout, Osteoarthritis.   Ofilia Neas, PA-C  Note - This record has been created using Dragon software.  Chart creation errors have been sought, but may not always  have been located. Such creation errors do not reflect on  the standard of medical care.

## 2020-07-06 HISTORY — PX: TOOTH EXTRACTION: SUR596

## 2020-07-09 ENCOUNTER — Other Ambulatory Visit: Payer: Self-pay | Admitting: Adult Health

## 2020-07-11 NOTE — Telephone Encounter (Signed)
DENIED.  PT IS PAST DUE FOR CPX. 

## 2020-07-13 ENCOUNTER — Other Ambulatory Visit: Payer: Self-pay

## 2020-07-13 ENCOUNTER — Encounter: Payer: Self-pay | Admitting: Physician Assistant

## 2020-07-13 ENCOUNTER — Ambulatory Visit (INDEPENDENT_AMBULATORY_CARE_PROVIDER_SITE_OTHER): Payer: Self-pay | Admitting: Physician Assistant

## 2020-07-13 VITALS — BP 157/116 | HR 72 | Resp 17 | Ht 77.0 in | Wt 261.6 lb

## 2020-07-13 DIAGNOSIS — E661 Drug-induced obesity: Secondary | ICD-10-CM

## 2020-07-13 DIAGNOSIS — Z6832 Body mass index (BMI) 32.0-32.9, adult: Secondary | ICD-10-CM

## 2020-07-13 DIAGNOSIS — E781 Pure hyperglyceridemia: Secondary | ICD-10-CM

## 2020-07-13 DIAGNOSIS — Z5181 Encounter for therapeutic drug level monitoring: Secondary | ICD-10-CM

## 2020-07-13 DIAGNOSIS — M19071 Primary osteoarthritis, right ankle and foot: Secondary | ICD-10-CM

## 2020-07-13 DIAGNOSIS — M19072 Primary osteoarthritis, left ankle and foot: Secondary | ICD-10-CM

## 2020-07-13 DIAGNOSIS — M1A00X0 Idiopathic chronic gout, unspecified site, without tophus (tophi): Secondary | ICD-10-CM

## 2020-07-13 DIAGNOSIS — Z87891 Personal history of nicotine dependence: Secondary | ICD-10-CM

## 2020-07-13 DIAGNOSIS — M19041 Primary osteoarthritis, right hand: Secondary | ICD-10-CM

## 2020-07-13 DIAGNOSIS — E1110 Type 2 diabetes mellitus with ketoacidosis without coma: Secondary | ICD-10-CM

## 2020-07-13 DIAGNOSIS — M170 Bilateral primary osteoarthritis of knee: Secondary | ICD-10-CM

## 2020-07-13 DIAGNOSIS — I10 Essential (primary) hypertension: Secondary | ICD-10-CM

## 2020-07-13 DIAGNOSIS — M19042 Primary osteoarthritis, left hand: Secondary | ICD-10-CM

## 2020-07-14 LAB — CBC WITH DIFFERENTIAL/PLATELET
Basophils Absolute: 0.1 10*3/uL (ref 0.0–0.2)
Basos: 1 %
EOS (ABSOLUTE): 0.1 10*3/uL (ref 0.0–0.4)
Eos: 1 %
Hematocrit: 33.4 % — ABNORMAL LOW (ref 37.5–51.0)
Hemoglobin: 11.1 g/dL — ABNORMAL LOW (ref 13.0–17.7)
Immature Grans (Abs): 0 10*3/uL (ref 0.0–0.1)
Immature Granulocytes: 0 %
Lymphocytes Absolute: 2.7 10*3/uL (ref 0.7–3.1)
Lymphs: 48 %
MCH: 30 pg (ref 26.6–33.0)
MCHC: 33.2 g/dL (ref 31.5–35.7)
MCV: 90 fL (ref 79–97)
Monocytes Absolute: 0.4 10*3/uL (ref 0.1–0.9)
Monocytes: 7 %
Neutrophils Absolute: 2.5 10*3/uL (ref 1.4–7.0)
Neutrophils: 43 %
Platelets: 428 10*3/uL (ref 150–450)
RBC: 3.7 x10E6/uL — ABNORMAL LOW (ref 4.14–5.80)
RDW: 14.2 % (ref 11.6–15.4)
WBC: 5.7 10*3/uL (ref 3.4–10.8)

## 2020-07-14 LAB — CMP14+EGFR
ALT: 23 IU/L (ref 0–44)
AST: 27 IU/L (ref 0–40)
Albumin/Globulin Ratio: 1.5 (ref 1.2–2.2)
Albumin: 4.6 g/dL (ref 3.8–4.9)
Alkaline Phosphatase: 37 IU/L — ABNORMAL LOW (ref 44–121)
BUN/Creatinine Ratio: 10 (ref 9–20)
BUN: 14 mg/dL (ref 6–24)
Bilirubin Total: 0.5 mg/dL (ref 0.0–1.2)
CO2: 27 mmol/L (ref 20–29)
Calcium: 9.7 mg/dL (ref 8.7–10.2)
Chloride: 102 mmol/L (ref 96–106)
Creatinine, Ser: 1.35 mg/dL — ABNORMAL HIGH (ref 0.76–1.27)
GFR calc Af Amer: 69 mL/min/{1.73_m2} (ref >59)
GFR calc non Af Amer: 60 mL/min/{1.73_m2} (ref >59)
Globulin, Total: 3.1 g/dL (ref 1.5–4.5)
Glucose: 79 mg/dL (ref 65–99)
Potassium: 3.8 mmol/L (ref 3.5–5.2)
Sodium: 143 mmol/L (ref 134–144)
Total Protein: 7.7 g/dL (ref 6.0–8.5)

## 2020-07-14 LAB — URIC ACID: Uric Acid: 6.6 mg/dL (ref 3.8–8.4)

## 2020-07-14 NOTE — Progress Notes (Signed)
Anemia is stable.  Creatinine is elevated-1.35. GFR is WNL-69. Please advise the patient to avoid NSAIDs.  Please forward lab work to PCP. Uric acid is 6.6. Discussed yesterday that ideally his uric acid should be less than 6.  Please advise the patient to continue on the current dose of allopurinol and advise him to avoid a purine rich diet and foods with high fructose corn syrup.

## 2020-07-18 ENCOUNTER — Other Ambulatory Visit: Payer: Self-pay | Admitting: Adult Health

## 2020-07-18 DIAGNOSIS — F339 Major depressive disorder, recurrent, unspecified: Secondary | ICD-10-CM

## 2020-07-19 NOTE — Telephone Encounter (Signed)
Sent to the pharmacy by e-scribe. I have scheduled the pt for cpx.  Nothing further needed. 

## 2020-07-29 ENCOUNTER — Other Ambulatory Visit: Payer: Self-pay | Admitting: Adult Health

## 2020-07-29 DIAGNOSIS — M1A49X0 Other secondary chronic gout, multiple sites, without tophus (tophi): Secondary | ICD-10-CM

## 2020-08-01 NOTE — Telephone Encounter (Signed)
SENT TO THE PHARMACY FOR 30 DAYS.

## 2020-08-01 NOTE — Telephone Encounter (Signed)
We are filling his allopurinol. OK to send in 30 days. Over due for CPE

## 2020-08-01 NOTE — Telephone Encounter (Signed)
Anemia is stable.  Creatinine is elevated-1.35. GFR is WNL-69. Please advise the patient to avoid NSAIDs.  Please forward lab work to PCP. Uric acid is 6.6. Discussed yesterday that ideally his uric acid should be less than 6.  Please advise the patient to continue on the current dose of allopurinol and advise him to avoid a purine rich diet and foods with high fructose corn syrup.   This message is from Rheumatology.  Are you filling or them?

## 2020-08-09 ENCOUNTER — Other Ambulatory Visit: Payer: Self-pay | Admitting: Adult Health

## 2020-08-10 NOTE — Telephone Encounter (Signed)
Sent to the pharmacy by e-scribe.  Pt has an upcoming cpx.

## 2020-08-23 ENCOUNTER — Other Ambulatory Visit: Payer: Self-pay

## 2020-08-23 ENCOUNTER — Ambulatory Visit (INDEPENDENT_AMBULATORY_CARE_PROVIDER_SITE_OTHER): Payer: BC Managed Care – PPO | Admitting: Adult Health

## 2020-08-23 ENCOUNTER — Encounter: Payer: Self-pay | Admitting: Adult Health

## 2020-08-23 VITALS — BP 150/80 | HR 95 | Temp 98.9°F | Ht 77.0 in | Wt 285.8 lb

## 2020-08-23 DIAGNOSIS — E1165 Type 2 diabetes mellitus with hyperglycemia: Secondary | ICD-10-CM

## 2020-08-23 DIAGNOSIS — Z0000 Encounter for general adult medical examination without abnormal findings: Secondary | ICD-10-CM

## 2020-08-23 DIAGNOSIS — Z125 Encounter for screening for malignant neoplasm of prostate: Secondary | ICD-10-CM | POA: Diagnosis not present

## 2020-08-23 DIAGNOSIS — F339 Major depressive disorder, recurrent, unspecified: Secondary | ICD-10-CM

## 2020-08-23 DIAGNOSIS — Z794 Long term (current) use of insulin: Secondary | ICD-10-CM

## 2020-08-23 DIAGNOSIS — E781 Pure hyperglyceridemia: Secondary | ICD-10-CM

## 2020-08-23 DIAGNOSIS — I10 Essential (primary) hypertension: Secondary | ICD-10-CM

## 2020-08-23 DIAGNOSIS — F1010 Alcohol abuse, uncomplicated: Secondary | ICD-10-CM | POA: Diagnosis not present

## 2020-08-23 DIAGNOSIS — M1A49X0 Other secondary chronic gout, multiple sites, without tophus (tophi): Secondary | ICD-10-CM

## 2020-08-23 LAB — COMPREHENSIVE METABOLIC PANEL
ALT: 20 U/L (ref 0–53)
AST: 26 U/L (ref 0–37)
Albumin: 4.4 g/dL (ref 3.5–5.2)
Alkaline Phosphatase: 36 U/L — ABNORMAL LOW (ref 39–117)
BUN: 15 mg/dL (ref 6–23)
CO2: 31 mEq/L (ref 19–32)
Calcium: 9.5 mg/dL (ref 8.4–10.5)
Chloride: 99 mEq/L (ref 96–112)
Creatinine, Ser: 1.41 mg/dL (ref 0.40–1.50)
GFR: 57.28 mL/min — ABNORMAL LOW (ref >60.00)
Glucose, Bld: 92 mg/dL (ref 70–99)
Potassium: 3.8 mEq/L (ref 3.5–5.1)
Sodium: 138 mEq/L (ref 135–145)
Total Bilirubin: 0.5 mg/dL (ref 0.2–1.2)
Total Protein: 7.5 g/dL (ref 6.0–8.3)

## 2020-08-23 LAB — CBC WITH DIFFERENTIAL/PLATELET
Basophils Absolute: 0 10*3/uL (ref 0.0–0.1)
Basophils Relative: 1 % (ref 0.0–3.0)
Eosinophils Absolute: 0 10*3/uL (ref 0.0–0.7)
Eosinophils Relative: 0.8 % (ref 0.0–5.0)
HCT: 34.6 % — ABNORMAL LOW (ref 39.0–52.0)
Hemoglobin: 11.3 g/dL — ABNORMAL LOW (ref 13.0–17.0)
Lymphocytes Relative: 35.5 % (ref 12.0–46.0)
Lymphs Abs: 1.5 10*3/uL (ref 0.7–4.0)
MCHC: 32.8 g/dL (ref 30.0–36.0)
MCV: 89.6 fl (ref 78.0–100.0)
Monocytes Absolute: 0.3 10*3/uL (ref 0.1–1.0)
Monocytes Relative: 7.7 % (ref 3.0–12.0)
Neutro Abs: 2.3 10*3/uL (ref 1.4–7.7)
Neutrophils Relative %: 55 % (ref 43.0–77.0)
Platelets: 441 10*3/uL — ABNORMAL HIGH (ref 150.0–400.0)
RBC: 3.86 Mil/uL — ABNORMAL LOW (ref 4.22–5.81)
RDW: 15.9 % — ABNORMAL HIGH (ref 11.5–15.5)
WBC: 4.2 10*3/uL (ref 4.0–10.5)

## 2020-08-23 LAB — LIPID PANEL
Cholesterol: 181 mg/dL (ref 0–200)
HDL: 58 mg/dL (ref >39.00)
NonHDL: 122.99
Total CHOL/HDL Ratio: 3
Triglycerides: 354 mg/dL — ABNORMAL HIGH (ref 0.0–149.0)
VLDL: 70.8 mg/dL — ABNORMAL HIGH (ref 0.0–40.0)

## 2020-08-23 LAB — LDL CHOLESTEROL, DIRECT: Direct LDL: 73 mg/dL

## 2020-08-23 MED ORDER — AMLODIPINE BESYLATE 10 MG PO TABS: 10.0000 mg | ORAL_TABLET | Freq: Every day | ORAL | 3 refills | Status: DC

## 2020-08-23 MED ORDER — NEBIVOLOL HCL 20 MG PO TABS: 20.0000 mg | ORAL_TABLET | Freq: Every day | ORAL | 3 refills | Status: DC

## 2020-08-23 MED ORDER — ALLOPURINOL 300 MG PO TABS: ORAL_TABLET | ORAL | 3 refills | Status: DC

## 2020-08-23 NOTE — Patient Instructions (Signed)
It was great seeing you today   We will follow up with you regarding your blood work   Please work on cutting back on alcohol and start exercising more   I am going to increase Bystolic to 20 mg. Let me know if your BP is not in the 120-130's/80's in the next 2-3 weeks

## 2020-08-23 NOTE — Progress Notes (Signed)
Subjective:    Patient ID: Keith Leblanc, male    DOB: 1966/05/01, 55 y.o.   MRN: [id number]  HPI Patient presents for yearly preventative medicine examination. He is a pleasant 55 year old male who  has a past medical history of Blood in stool, Chicken pox, Diabetes mellitus ([place]), Erectile dysfunction, Gout, and Hypertension.  DM -managed by Dr. Cruzita Lederer.  Currently prescribed Lantus 30 units daily at 10 PM and Trulicity 1.5 mg weekly Lab Results  Component Value Date   HGBA1C 5.3 03/13/2020   Hypertension-takes Norvasc 10 mg and  Bystolic 10 mg daily.  He denies dizziness, lightheadedness, blurred vision, or headaches. He is checking his BP at home has been getting elevated blood pressure readings in the 140s to 150s over 90s on a consistent basis.  Reports that he takes both medications daily BP Readings from Last 3 Encounters:  08/23/20 (!) 150/80  07/13/20 (!) 157/116  03/13/20 130/90   Hyperlipidemia-was prescribed Lipitor in the past but stopped taking this medication  Lab Results  Component Value Date   CHOL 229 (H) 05/27/2019   HDL 55.30 05/27/2019   LDLCALC 143 (H) 08/27/2016   LDLDIRECT 106.0 05/27/2019   TRIG (H) 05/27/2019    481.0 Triglyceride is over 400; calculations on Lipids are invalid.   CHOLHDL 4 05/27/2019    Gout - takes Allopurinol 300 mg daily and colchicine PRN for acute gout flare.  He is followed by rheumatology  GERD - takes Prilosec 40 mg daily   Anxiety and Depression-feels as though his symptoms are much more controlled since starting Wellbutrin.    Alcohol Abuse -continues to drink about a pint of tequila every day.  Has been without alcohol twice in the last 2 months  All immunizations and health maintenance protocols were reviewed with the patient and needed orders were placed.  Appropriate screening laboratory values were ordered for the patient including screening of hyperlipidemia, renal function and hepatic function. If indicated by  BPH, a PSA was ordered.  Medication reconciliation,  past medical history, social history, problem list and allergies were reviewed in detail with the patient  Goals were established with regard to weight loss, exercise, and  diet in compliance with medications  Wt Readings from Last 3 Encounters:  08/23/20 285 lb 12.8 oz (129.6 kg)  07/13/20 261 lb 9.6 oz (118.7 kg)  03/13/20 268 lb (121.6 kg)    He is up-to-date on routine colon cancer screening  Review of Systems  Constitutional: Negative.   HENT: Negative.   Eyes: Negative.   Respiratory: Negative.   Cardiovascular: Negative.   Gastrointestinal: Negative.   Endocrine: Negative.   Genitourinary: Negative.   Musculoskeletal: Positive for arthralgias.  Skin: Negative.   Allergic/Immunologic: Negative.   Neurological: Negative.   Hematological: Negative.   Psychiatric/Behavioral: Negative.   All other systems reviewed and are negative.  Past Medical History:  Diagnosis Date  . Blood in stool    bright red blood   . Chicken pox   . Diabetes mellitus ([place])   . Erectile dysfunction   . Gout   . Hypertension     Social History   Socioeconomic History  . Marital status: Single    Spouse name: Not on file  . Number of children: Not on file  . Years of education: Not on file  . Highest education level: Not on file  Occupational History  . Not on file  Tobacco Use  . Smoking status: Former Research scientist (life sciences)  . Smokeless  tobacco: Never Used  Vaping Use  . Vaping Use: Never used  Substance and Sexual Activity  . Alcohol use: Yes    Comment: daily   . Drug use: Not Currently  . Sexual activity: Yes  Other Topics Concern  . Not on file  Social History Narrative   Works for [employer]    Not married    One daughter who does not live with him    Likes to gamble, walk in the parks, travel.    Social Determinants of Health   Financial Resource Strain: Not on file  Food Insecurity: Not on file  Transportation Needs: Not on  file  Physical Activity: Not on file  Stress: Not on file  Social Connections: Not on file  Intimate Partner Violence: Not on file    Past Surgical History:  Procedure Laterality Date  . APPENDECTOMY  2004  . COLONOSCOPY    . TOOTH EXTRACTION  07/06/2020    Family History  Problem Relation Age of Onset  . Alcohol abuse Father   . Hypertension Father   . Heart disease Father   . Gout Father   . Breast cancer Mother   . Hypertension Mother   . Gout Mother   . Healthy Sister   . Healthy Daughter   . Colon cancer Neg Hx   . Esophageal cancer Neg Hx   . Rectal cancer Neg Hx   . Stomach cancer Neg Hx     No Known Allergies  Current Outpatient Medications on File Prior to Visit  Medication Sig Dispense Refill  . allopurinol (ZYLOPRIM) 300 MG tablet Take 1 tablet by mouth daily.  **DUE FOR YEARLY PHYSICAL** 30 tablet 0  . amLODipine (NORVASC) 10 MG tablet TAKE 1 TABLET(10 MG) BY MOUTH DAILY 90 tablet 0  . blood glucose meter kit and supplies Dispense based on patient and insurance preference. Use up to four times daily as directed. (FOR ICD-9 250.00, 250.01). 1 each 0  . buPROPion (WELLBUTRIN XL) 300 MG 24 hr tablet TAKE 1 TABLET BY MOUTH  DAILY 90 tablet 0  . colchicine 0.6 MG tablet TAKE 1 TABLET BY MOUTH  DAILY . WHEN HAVING FLARES  TAKE 1 TABLET TWICE DAILY . 180 tablet 0  . insulin glargine (LANTUS SOLOSTAR) 100 UNIT/ML Solostar Pen INJECT SUBCUTANEOUSLY 30  UNITS DAILY AT 10 PM 30 mL 3  . Insulin Pen Needle (B-D UF III MINI PEN NEEDLES) 31G X 5 MM MISC USE 4 TIMES DAILY AFTER MEALS AND AT BEDTIME 100 each 11  . Lancets (ONETOUCH ULTRASOFT) lancets Test blood sugars as directed. Dx E11.9 100 each 12  . nebivolol (BYSTOLIC) 10 MG tablet TAKE 1 TABLET BY MOUTH  DAILY 90 tablet 0  . omeprazole (PRILOSEC) 40 MG capsule Take 1 capsule (40 mg total) by mouth daily. 90 capsule 1  . ONE TOUCH ULTRA TEST test strip USE UP TO 4 TIMES DAILY AS DIRECTED 100 each 3  . sildenafil  (REVATIO) 20 MG tablet TAKE 2-5 TABLETS BY MOUTH AS NEEDED FOR SEXUAL ACTIVITY 50 tablet 2  . TRULICITY 1.5 TG/2.5WL SOPN INJECT THE CONTENTS OF 1  PEN SUBCUTANEOUSLY WEEKLY 6 mL 3   No current facility-administered medications on file prior to visit.    BP (!) 150/80   Pulse 95   Temp 98.9 F (37.2 C) (Oral)   Ht _0  (1.956 m)   Wt 285 lb 12.8 oz (129.6 kg)   SpO2 99%   BMI 33.89 kg/m  Objective:   Physical Exam Vitals and nursing note reviewed.  Constitutional:      General: He is not in acute distress.    Appearance: Normal appearance. He is well-developed. He is obese.  HENT:     Head: Normocephalic and atraumatic.     Right Ear: Tympanic membrane, ear canal and external ear normal. There is no impacted cerumen.     Left Ear: Tympanic membrane, ear canal and external ear normal. There is no impacted cerumen.     Nose: Nose normal. No congestion or rhinorrhea.     Mouth/Throat:     Mouth: Mucous membranes are moist.     Pharynx: Oropharynx is clear. No oropharyngeal exudate or posterior oropharyngeal erythema.  Eyes:     General:        Right eye: No discharge.        Left eye: No discharge.     Extraocular Movements: Extraocular movements intact.     Conjunctiva/sclera: Conjunctivae normal.     Pupils: Pupils are equal, round, and reactive to light.  Neck:     Vascular: No carotid bruit.     Trachea: No tracheal deviation.  Cardiovascular:     Rate and Rhythm: Normal rate and regular rhythm.     Pulses: Normal pulses.     Heart sounds: Normal heart sounds. No murmur heard. No friction rub. No gallop.   Pulmonary:     Effort: Pulmonary effort is normal. No respiratory distress.     Breath sounds: Normal breath sounds. No stridor. No wheezing, rhonchi or rales.  Chest:     Chest wall: No tenderness.  Abdominal:     General: Bowel sounds are normal. There is no distension.     Palpations: Abdomen is soft. There is no mass.     Tenderness: There is no  abdominal tenderness. There is no right CVA tenderness, left CVA tenderness, guarding or rebound.     Hernia: No hernia is present.  Musculoskeletal:        General: No swelling, tenderness, deformity or signs of injury. Normal range of motion.     Right lower leg: No edema.     Left lower leg: No edema.  Lymphadenopathy:     Cervical: No cervical adenopathy.  Skin:    General: Skin is warm and dry.     Capillary Refill: Capillary refill takes less than 2 seconds.     Coloration: Skin is not jaundiced or pale.     Findings: No bruising, erythema, lesion or rash.  Neurological:     General: No focal deficit present.     Mental Status: He is alert and oriented to person, place, and time.     Cranial Nerves: No cranial nerve deficit.     Sensory: No sensory deficit.     Motor: No weakness.     Coordination: Coordination normal.     Gait: Gait normal.     Deep Tendon Reflexes: Reflexes normal.  Psychiatric:        Mood and Affect: Mood normal.        Behavior: Behavior normal.        Thought Content: Thought content normal.        Judgment: Judgment normal.       Assessment & Plan:  1. Routine general medical examination at a health care facility -Needs to quit drinking -Needs to start exercising to help lose weight -Follow-up in 1 year or sooner if needed - CBC with Differential/Platelet -  Comprehensive metabolic panel - Lipid panel - TSH - Microalbumin/Creatinine Ratio, Urine - COMPLETE METABOLIC PANEL WITH GFR  2. Essential hypertension -Increase Bystolic to 20 mg.  He will continue to monitor his blood pressure at home and will let me know if blood pressure readings do not come down into the 120s to 130s over 80s. - CBC with Differential/Platelet - Comprehensive metabolic panel - Lipid panel - TSH - Microalbumin/Creatinine Ratio, Urine - amLODipine (NORVASC) 10 MG tablet; Take 1 tablet (10 mg total) by mouth daily.  Dispense: 90 tablet; Refill: 3 - Nebivolol HCl  (BYSTOLIC) 20 MG TABS; Take 1 tablet (20 mg total) by mouth daily.  Dispense: 90 tablet; Refill: 3  3. Hypertriglyceridemia will likely need statin - CBC with Differential/Platelet - Comprehensive metabolic panel - Lipid panel - TSH - Microalbumin/Creatinine Ratio, Urine  4. Type 2 diabetes mellitus with hyperglycemia, with long-term current use of insulin ([facility]) -Follow up with Endocrinology as directed - CBC with Differential/Platelet - Comprehensive metabolic panel - Lipid panel - TSH - Microalbumin/Creatinine Ratio, Urine  5. Alcohol abuse -Encouraged to cut back on alcohol consumption - CBC with Differential/Platelet - Comprehensive metabolic panel - Lipid panel - TSH  6. Prostate cancer screening  - PSA  7. Other secondary chronic gout of multiple sites without tophus - Follow up with Rheumatology as directed - allopurinol (ZYLOPRIM) 300 MG tablet; Take 1 tablet by mouth daily.  Dispense: 90 tablet; Refill: 3  8. Depression, recurrent ([place]) -Continue with Wellbutrin 300 mg extended release daily  Dorothyann Peng, NP

## 2020-08-24 LAB — PSA: PSA: 0.4 ng/mL (ref 0.10–4.00)

## 2020-08-24 LAB — TSH: TSH: 1.46 u[IU]/mL (ref 0.35–4.50)

## 2020-08-25 MED ORDER — FENOFIBRATE 145 MG PO TABS: 145.0000 mg | ORAL_TABLET | Freq: Every day | ORAL | 5 refills | Status: DC

## 2020-08-25 NOTE — Addendum Note (Signed)
Addended by: Agnes Lawrence on: 08/25/2020 10:13 AM   Modules accepted: Orders

## 2020-08-27 ENCOUNTER — Other Ambulatory Visit: Payer: Self-pay | Admitting: Physician Assistant

## 2020-08-28 ENCOUNTER — Other Ambulatory Visit: Payer: Self-pay | Admitting: Physician Assistant

## 2020-08-29 NOTE — Telephone Encounter (Signed)
Last Visit: 07/13/2020 Next Visit: 12/14/2020 Labs: 07/13/2020, uric acid Uric acid is 6.6. Discussed yesterday that ideally his uric acid should be less than 6. Please advise the patient to continue on the current dose of allopurinol and advise him to avoid a purine rich diet and foods with high fructose corn syrup.   08/23/2020 CBC, CMP Alkaline Phosphatase 36, GFR 57.28, RBC 3.86, Hemoglobin 11.3, HCT 34.6, RDW 15.9, Platelets 441.0, Triglycerides 354.0, VLDL 70.8,   Current Dose per office note 07/13/2020,  colchicine 0.6 mg p.o. daily DX: Gouty arthropathy, chronic, without tophi   Last Fill: 07/03/2020  Okay to refill colchicine?

## 2020-09-06 ENCOUNTER — Other Ambulatory Visit: Payer: Self-pay | Admitting: Gastroenterology

## 2020-09-07 ENCOUNTER — Encounter: Payer: Self-pay | Admitting: Adult Health

## 2020-09-07 MED ORDER — OMEPRAZOLE 40 MG PO CPDR: 40.0000 mg | DELAYED_RELEASE_CAPSULE | Freq: Every day | ORAL | 1 refills | Status: DC

## 2020-09-14 ENCOUNTER — Ambulatory Visit: Payer: Managed Care, Other (non HMO) | Admitting: Internal Medicine

## 2020-09-28 ENCOUNTER — Telehealth: Payer: Self-pay

## 2020-09-28 NOTE — Telephone Encounter (Signed)
PA was done for Trulicity but it stated that a previously PA was already done and the pt was approved for Trulicity from [id number]

## 2020-10-18 ENCOUNTER — Other Ambulatory Visit: Payer: Self-pay | Admitting: Adult Health

## 2020-10-18 ENCOUNTER — Other Ambulatory Visit: Payer: Self-pay | Admitting: *Deleted

## 2020-10-18 DIAGNOSIS — M1A49X0 Other secondary chronic gout, multiple sites, without tophus (tophi): Secondary | ICD-10-CM

## 2020-10-18 MED ORDER — ALLOPURINOL 300 MG PO TABS: ORAL_TABLET | ORAL | 1 refills | Status: DC

## 2020-11-08 ENCOUNTER — Other Ambulatory Visit: Payer: Self-pay | Admitting: Adult Health

## 2020-11-08 DIAGNOSIS — I10 Essential (primary) hypertension: Secondary | ICD-10-CM

## 2020-11-16 ENCOUNTER — Ambulatory Visit: Payer: BC Managed Care – PPO | Admitting: Internal Medicine

## 2020-11-22 ENCOUNTER — Other Ambulatory Visit: Payer: Self-pay | Admitting: Rheumatology

## 2020-11-23 NOTE — Telephone Encounter (Signed)
Last Visit: 07/13/2020 Next Visit: 12/14/2020 Labs: 07/13/2020, uric acid Uric acid is 6.6. 08/23/2020 CBC, CMP Alkaline Phosphatase 36, GFR 57.28, RBC 3.86, Hemoglobin 11.3, HCT 34.6, RDW 15.9, Platelets 441.0, Triglycerides 354.0, VLDL 70.8,   Current Dose per office note 07/13/2020, colchicine 0.6 mg p.o. daily DX: Gouty arthropathy, chronic, without tophi  Last Fill: 08/29/2020  Okay to refill Colchicine?

## 2020-11-30 ENCOUNTER — Ambulatory Visit: Payer: BC Managed Care – PPO | Admitting: Internal Medicine

## 2020-11-30 ENCOUNTER — Other Ambulatory Visit: Payer: Self-pay

## 2020-11-30 ENCOUNTER — Encounter: Payer: Self-pay | Admitting: Internal Medicine

## 2020-11-30 VITALS — BP 140/100 | HR 70 | Ht 77.0 in | Wt 255.8 lb

## 2020-11-30 DIAGNOSIS — Z794 Long term (current) use of insulin: Secondary | ICD-10-CM | POA: Diagnosis not present

## 2020-11-30 DIAGNOSIS — E781 Pure hyperglyceridemia: Secondary | ICD-10-CM

## 2020-11-30 DIAGNOSIS — E1165 Type 2 diabetes mellitus with hyperglycemia: Secondary | ICD-10-CM

## 2020-11-30 DIAGNOSIS — E661 Drug-induced obesity: Secondary | ICD-10-CM | POA: Diagnosis not present

## 2020-11-30 DIAGNOSIS — Z6832 Body mass index (BMI) 32.0-32.9, adult: Secondary | ICD-10-CM

## 2020-11-30 LAB — POCT GLYCOSYLATED HEMOGLOBIN (HGB A1C): Hemoglobin A1C: 5.1 % (ref 4.0–5.6)

## 2020-11-30 NOTE — Patient Instructions (Signed)
Please continue: - Trulicity 1.5 mg weekly - Lantus 24 units in a.m.  Please return in 6 months with your sugar log.

## 2020-11-30 NOTE — Progress Notes (Deleted)
Office Visit Note  Patient: Keith Leblanc             Date of Birth: 02-12-1966           MRN: [id number]             PCP: Dorothyann Peng, NP Referring: Dorothyann Peng, NP Visit Date: 12/14/2020 Occupation: @GUAROCC @  Subjective:  No chief complaint on file.   History of Present Illness: Keith Leblanc is a 55 y.o. male ***   Activities of Daily Living:  Patient reports morning stiffness for *** {minute/hour:19697}.   Patient {ACTIONS;DENIES/REPORTS:21021675::"Denies"} nocturnal pain.  Difficulty dressing/grooming: {ACTIONS;DENIES/REPORTS:21021675::"Denies"} Difficulty climbing stairs: {ACTIONS;DENIES/REPORTS:21021675::"Denies"} Difficulty getting out of chair: {ACTIONS;DENIES/REPORTS:21021675::"Denies"} Difficulty using hands for taps, buttons, cutlery, and/or writing: {ACTIONS;DENIES/REPORTS:21021675::"Denies"}  No Rheumatology ROS completed.   PMFS History:  Patient Active Problem List   Diagnosis Date Noted  . Hypertriglyceridemia 05/04/2018  . Class 1 drug-induced obesity with serious comorbidity and body mass index (BMI) of 32.0 to 32.9 in adult 01/21/2017  . Type 2 diabetes mellitus with hyperglycemia ([place]) 08/27/2016  . Protein-calorie malnutrition, severe 07/08/2016  . Diabetic ketoacidosis without coma associated with type 2 diabetes mellitus ([place]) 07/07/2016  . Acute renal failure (ARF) ([place]) 07/07/2016  . Hyponatremia 07/07/2016  . Hyperkalemia 07/07/2016  . Obesity (BMI 30-39.9) 07/07/2016  . Tobacco abuse 07/07/2016  . Overweight (BMI 25.0-29.9)   . Blood in stool 05/21/2016  . Essential hypertension 04/28/2015  . Erectile dysfunction 04/28/2015  . Gout 04/28/2015    Past Medical History:  Diagnosis Date  . Blood in stool    bright red blood   . Chicken pox   . Diabetes mellitus ([place])   . Erectile dysfunction   . Gout   . Hypertension     Family History  Problem Relation Age of Onset  . Alcohol abuse Father   . Hypertension Father   .  Heart disease Father   . Gout Father   . Breast cancer Mother   . Hypertension Mother   . Gout Mother   . Healthy Sister   . Healthy Daughter   . Colon cancer Neg Hx   . Esophageal cancer Neg Hx   . Rectal cancer Neg Hx   . Stomach cancer Neg Hx    Past Surgical History:  Procedure Laterality Date  . APPENDECTOMY  2004  . COLONOSCOPY    . TOOTH EXTRACTION  07/06/2020   Social History   Social History Narrative   Works for [employer]    Not married    One daughter who does not live with him    Likes to gamble, walk in the parks, travel.    Immunization History  Administered Date(s) Administered  . PFIZER(Purple Top)SARS-COV-2 Vaccination 07/27/2019, 08/17/2019, 06/10/2020  . Pneumococcal Polysaccharide-23 04/03/2018     Objective: Vital Signs: There were no vitals taken for this visit.   Physical Exam   Musculoskeletal Exam: ***  CDAI Exam: CDAI Score: -- Patient Global: --; Provider Global: -- Swollen: --; Tender: -- Joint Exam 12/14/2020   No joint exam has been documented for this visit   There is currently no information documented on the homunculus. Go to the Rheumatology activity and complete the homunculus joint exam.  Investigation: No additional findings.  Imaging: No results found.  Recent Labs: Lab Results  Component Value Date   WBC 4.2 08/23/2020   HGB 11.3 (L) 08/23/2020   PLT 441.0 (H) 08/23/2020   NA 138 08/23/2020   K 3.8 08/23/2020   CL  99 08/23/2020   CO2 31 08/23/2020   GLUCOSE 92 08/23/2020   BUN 15 08/23/2020   CREATININE 1.41 08/23/2020   BILITOT 0.5 08/23/2020   ALKPHOS 36 (L) 08/23/2020   AST 26 08/23/2020   ALT 20 08/23/2020   PROT 7.5 08/23/2020   ALBUMIN 4.4 08/23/2020   CALCIUM 9.5 08/23/2020   GFRAA 69 07/13/2020    Speciality Comments: No specialty comments available.  Procedures:  No procedures performed Allergies: Patient has no known allergies.   Assessment / Plan:     Visit Diagnoses: No diagnosis  found.  Orders: No orders of the defined types were placed in this encounter.  No orders of the defined types were placed in this encounter.   Face-to-face time spent with patient was *** minutes. Greater than 50% of time was spent in counseling and coordination of care.  Follow-Up Instructions: No follow-ups on file.   Earnestine Mealing, CMA  Note - This record has been created using Editor, commissioning.  Chart creation errors have been sought, but may not always  have been located. Such creation errors do not reflect on  the standard of medical care.

## 2020-11-30 NOTE — Progress Notes (Signed)
Patient ID: Keith Leblanc, male   DOB: 1965/11/27, 55 y.o.   MRN: [id number]   This visit occurred during the SARS-CoV-2 public health emergency.  Safety protocols were in place, including screening questions prior to the visit, additional usage of staff PPE, and extensive cleaning of exam room while observing appropriate contact time as indicated for disinfecting solutions.   HPI: Keith Leblanc is a 55 y.o.-year-old male, returning for f/u for  DM2, dx in 06/2016 (prev. Prediabetes since 2015-16), insulin-dependent, uncontrolled, without long term complications. Last visit 9 months ago.  Interim history: No increased urination, blurry vision, chest pain. He has some mild nausea. He still drinks some alcohol, but reduced the amount.  Reviewed history: Patient was diagnosed with diabetes in 07/07/2016, when he presented to the hospital in DKA. An A1c was 12%. His CBG was 1081. Retrospectively, he had increased urination, increased thirst, blurred vision, fatigue, rapid weight loss of 25 lbs. He had a lot of stress at work + drinking A LOT of sweet drinks (4 cans of soda a day + gatorade + juice). He is now off these but still working on decreasing Alcohol - 4-5 beers a day.  He was started on insulin inhouse, and his CBG is greatly improved afterwards. Less than a month later, his HbA1c decreased to 9.6%.  Reviewed HbA1c levels: Lab Results  Component Value Date   HGBA1C 5.3 03/13/2020   HGBA1C 6.5 (A) 08/31/2019   HGBA1C 6.0 (A) 09/01/2018   Pt was on a regimen of: - Metformin 500 mg 0-1x a day with dinner >> could not tolerate this 2/2 nausea - Basaglar 40 units at bedtime - takes 2-3x a week - Novolog 6 units 3x a day, before meals >> stopped 32/6712  Now on: - Trulicity 4.58 >> 1.5 mg weekly - Lantus 30 >> 24 units daily in am  Pt checks his sugars once a day: - am:  120-145 >> 160-165 >> 110-140, 160 >> 89, 130s, 140 (11 am-12 pm) - 2h after b'fast: 107-122 >> n/c - before  lunch: 120-145 >> n/c >> 130-140 (possibly after a snack) >> see above  - 2h after lunch: n/c - before dinner:  n/c >> 140-175 >> 120 >> n/c >> 95-110 - 2h after dinner: n/c >> 120s - bedtime: n/c >> 89-100 >> n/c - nighttime: n/c Lowest sugar was 89 >> 120 >> 100 >> 89; he has hypoglycemia awareness at 80s Highest sugar was 400s ... >> 160 >> 140. He was in the ED 05/2018 for hyperglycemia (CBG 400s >> ED: 361). He was having blurry vision then.  Glucometer: One Touch Ultra  Pt's meals are: - Breakfast: 2-3 boiled eggs, sausage, cheese, diet tea - Lunch: salad, chicken or pork, coke zero - Dinner: sea food + veggies + brown rice, coke zero - Snacks: water  -No history of CKD: Lab Results  Component Value Date   BUN 15 08/23/2020   BUN 14 07/13/2020   CREATININE 1.41 08/23/2020   CREATININE 1.35 (H) 07/13/2020   -+ HL: Lab Results  Component Value Date   CHOL 181 08/23/2020   HDL 58.00 08/23/2020   LDLCALC 143 (H) 08/27/2016   LDLDIRECT 73.0 08/23/2020   TRIG 354.0 (H) 08/23/2020   CHOLHDL 3 08/23/2020  On Fenofibrate.  - last eye exam was in 06/2020: No DR reportedly.   -No numbness and tingling in his feet.  On White Ginseng, Ashwagandha (now off for last 2 weeks), burdock root.  ROS: Constitutional: no weight  gain/+ weight loss, no fatigue, no subjective hyperthermia, no subjective hypothermia Eyes: no blurry vision, no xerophthalmia ENT: no sore throat, no nodules palpated in neck, no dysphagia, no odynophagia, no hoarseness Cardiovascular: no CP/no SOB/no palpitations/no leg swelling Respiratory: no cough/no SOB/no wheezing Gastrointestinal: + N/no V/no D/no C/no acid reflux Musculoskeletal: no muscle aches/no joint aches Skin: no rashes, no hair loss Neurological: no tremors/no numbness/no tingling/no dizziness  I reviewed pt's medications, allergies, PMH, social hx, family hx, and changes were documented in the history of present illness. Otherwise,  unchanged from my initial visit note.  Past Medical History:  Diagnosis Date   Blood in stool    bright red blood    Chicken pox    Diabetes mellitus ([place])    Erectile dysfunction    Gout    Hypertension    Past Surgical History:  Procedure Laterality Date   APPENDECTOMY  2004   COLONOSCOPY     TOOTH EXTRACTION  07/06/2020   Social History   Social History   Marital status: Single    Spouse name: N/A   Number of children: 1 - 53 y/o in 2018   Occupational History   Heritage manager    Social History Main Topics   Smoking status: Former Smoker   Smokeless tobacco: Started to chew tobacco    Alcohol use      Comment: 4-5 beers a day    Drug use: Yes    Types: Marijuana   Sexual activity: Yes   Social History Narrative   Works for [employer]    Not married    One daughter who does not live with him    Likes to gamble, walk in the parks, travel.    Current Outpatient Medications on File Prior to Visit  Medication Sig Dispense Refill   colchicine 0.6 MG tablet TAKE 1 TABLET BY MOUTH  DAILY. WHEN HAVING FLARES  TAKE 1 TABLET BY MOUTH  TWICE DAILY 180 tablet 0   allopurinol (ZYLOPRIM) 300 MG tablet Take 1 tablet by mouth daily. 90 tablet 1   amLODipine (NORVASC) 10 MG tablet TAKE 1 TABLET BY MOUTH EVERY DAY 30 tablet 0   blood glucose meter kit and supplies Dispense based on patient and insurance preference. Use up to four times daily as directed. (FOR ICD-9 250.00, 250.01). 1 each 0   buPROPion (WELLBUTRIN XL) 300 MG 24 hr tablet TAKE 1 TABLET BY MOUTH  DAILY 90 tablet 0   fenofibrate (TRICOR) 145 MG tablet Take 1 tablet (145 mg total) by mouth daily. 30 tablet 5   insulin glargine (LANTUS SOLOSTAR) 100 UNIT/ML Solostar Pen INJECT SUBCUTANEOUSLY 30  UNITS DAILY AT 10 PM 30 mL 3   Insulin Pen Needle (B-D UF III MINI PEN NEEDLES) 31G X 5 MM MISC USE 4 TIMES DAILY AFTER MEALS AND AT BEDTIME 100 each 11   Lancets (ONETOUCH ULTRASOFT) lancets Test blood sugars as directed.  Dx E11.9 100 each 12   Nebivolol HCl (BYSTOLIC) 20 MG TABS Take 1 tablet (20 mg total) by mouth daily. 90 tablet 3   omeprazole (PRILOSEC) 40 MG capsule Take 1 capsule (40 mg total) by mouth daily. 90 capsule 1   ONE TOUCH ULTRA TEST test strip USE UP TO 4 TIMES DAILY AS DIRECTED 100 each 3   sildenafil (REVATIO) 20 MG tablet TAKE 2-5 TABLETS BY MOUTH AS NEEDED FOR SEXUAL ACTIVITY 50 tablet 2   TRULICITY 1.5 ZO/1.0RU SOPN INJECT THE CONTENTS OF 1  PEN SUBCUTANEOUSLY  WEEKLY 6 mL 3   No current facility-administered medications on file prior to visit.   No Known Allergies Family History  Problem Relation Age of Onset   Alcohol abuse Father    Hypertension Father    Heart disease Father    Gout Father    Breast cancer Mother    Hypertension Mother    Gout Mother    Healthy Sister    Healthy Daughter    Colon cancer Neg Hx    Esophageal cancer Neg Hx    Rectal cancer Neg Hx    Stomach cancer Neg Hx   Pt has FH of DM in mother.  PE: BP (!) 140/100 (BP Location: Right Arm, Patient Position: Sitting, Cuff Size: Normal)   Pulse 70   Ht $R'6\' 5"'iU$  (1.956 m)   Wt 255 lb 12.8 oz (116 kg)   SpO2 98%   BMI 30.33 kg/m  Wt Readings from Last 3 Encounters:  11/30/20 255 lb 12.8 oz (116 kg)  08/23/20 285 lb 12.8 oz (129.6 kg)  07/13/20 261 lb 9.6 oz (118.7 kg)   Constitutional: overweight, in NAD Eyes: PERRLA, EOMI, no exophthalmos ENT: moist mucous membranes, no thyromegaly, no cervical lymphadenopathy Cardiovascular: RRR, No MRG Respiratory: CTA B Gastrointestinal: abdomen soft, NT, ND, BS+ Musculoskeletal: no deformities, strength intact in all 4 Skin: moist, warm, no rashes Neurological: + Mild tremor with outstretched hands, DTR normal in all 4  ASSESSMENT: 1. DM2, -insulin-dependent, uncontrolled, without long term complications, but with hyperglycemia  He has type 2, rather than type 1 diabetes: Component     Latest Ref Rng & Units 08/27/2016  Glucose     70 - 99 mg/dL 118 (H)   Pancreatic Islet Cell Antibody     <5 JDF Units <5  Glutamic Acid Decarb Ab     <5 IU/mL <5  C-Peptide     0.80 - 3.85 ng/mL 1.45   2. Obesity class 1 BMI Classification: < 18.5 underweight  18.5-24.9 normal weight  25.0-29.9 overweight  30.0-34.9 class I obesity  35.0-39.9 class II obesity  ? 40.0 class III obesity   3.  Hypertriglyceridemia  PLAN:  1. Patient with history of uncontrolled diabetes, on basal insulin and weekly GLP-1 receptor agonist.  In 2019, he started to improve his diet and reduce alcohol and his HbA1c improved significantly to 6.0%.  At last visit, this improved even further, to 5.3%.  However, this A1c appeared to be lower than expected from his log.  He did not have any low blood sugars.  His test strips were not old, he was not on iron and did not have any blood transfusions.  She does have anemia, with the latest Hb 11.3 in 08/2020.  He was on colchicine, which can lower blood sugars. -We did not change his regimen at last visit but I advised him to let me know if he experienced any lows, to reduce his Lantus dose. -At this visit, he does not bring a log but per his recall, sugars are at or slightly higher than goal in the morning and they are controlled later in the day.  Highest blood sugar he saw was 140.  No lows.  I did advise him to continue the same dose of Trulicity and [place], to bring the log or meter at next visit, and also to let me know if he has any blood sugars lower than 70, in which case, we need to decrease his insulin dose. - I suggested to:  Patient Instructions  Please continue: - Trulicity 1.5 mg weekly - Lantus 24 units in a.m.  Please return in 6 months with your sugar log.   - we checked his HbA1c: 5.1% (lower than expected from his CBGs) - advised to check sugars at different times of the day - 1x a day, rotating check times - advised for yearly eye exams >> he is now UTD - return to clinic in 6 months  2. Obesity class 1 -We  will continue the GLP-1 receptor agonist which should also help with weight loss -She lost 4 pounds before last visit; lost 30 lbs since last OV!  3. HTG -I did advise him in the past and again today about reducing/stopping alcohol which can raise his triglycerides. -Reviewed latest lipid panel: LDL much improved, triglycerides high: Lab Results  Component Value Date   CHOL 181 08/23/2020   HDL 58.00 08/23/2020   LDLCALC 143 (H) 08/27/2016   LDLDIRECT 73.0 08/23/2020   TRIG 354.0 (H) 08/23/2020   CHOLHDL 3 08/23/2020  -On  fenofibrate 145 mg daily.  He tolerates this well.  Philemon Kingdom, MD PhD [facility]

## 2020-12-08 ENCOUNTER — Other Ambulatory Visit: Payer: Self-pay | Admitting: Adult Health

## 2020-12-08 DIAGNOSIS — F339 Major depressive disorder, recurrent, unspecified: Secondary | ICD-10-CM

## 2020-12-13 ENCOUNTER — Other Ambulatory Visit: Payer: Self-pay | Admitting: Adult Health

## 2020-12-14 ENCOUNTER — Ambulatory Visit: Payer: Managed Care, Other (non HMO) | Admitting: Rheumatology

## 2021-02-06 ENCOUNTER — Other Ambulatory Visit: Payer: Self-pay | Admitting: Adult Health

## 2021-02-13 ENCOUNTER — Other Ambulatory Visit: Payer: Self-pay | Admitting: Internal Medicine

## 2021-02-16 ENCOUNTER — Other Ambulatory Visit: Payer: Self-pay | Admitting: Rheumatology

## 2021-02-19 ENCOUNTER — Other Ambulatory Visit: Payer: Self-pay | Admitting: Internal Medicine

## 2021-02-24 IMAGING — CR DG KNEE COMPLETE 4+V*R*
4 series · 4 of 4 positions shown · non-contrast
Comparison: None

CLINICAL DATA: Gout. Severe right-sided knee pain.

EXAM:
RIGHT KNEE - COMPLETE 4+ VIEW

[t knee ap right]
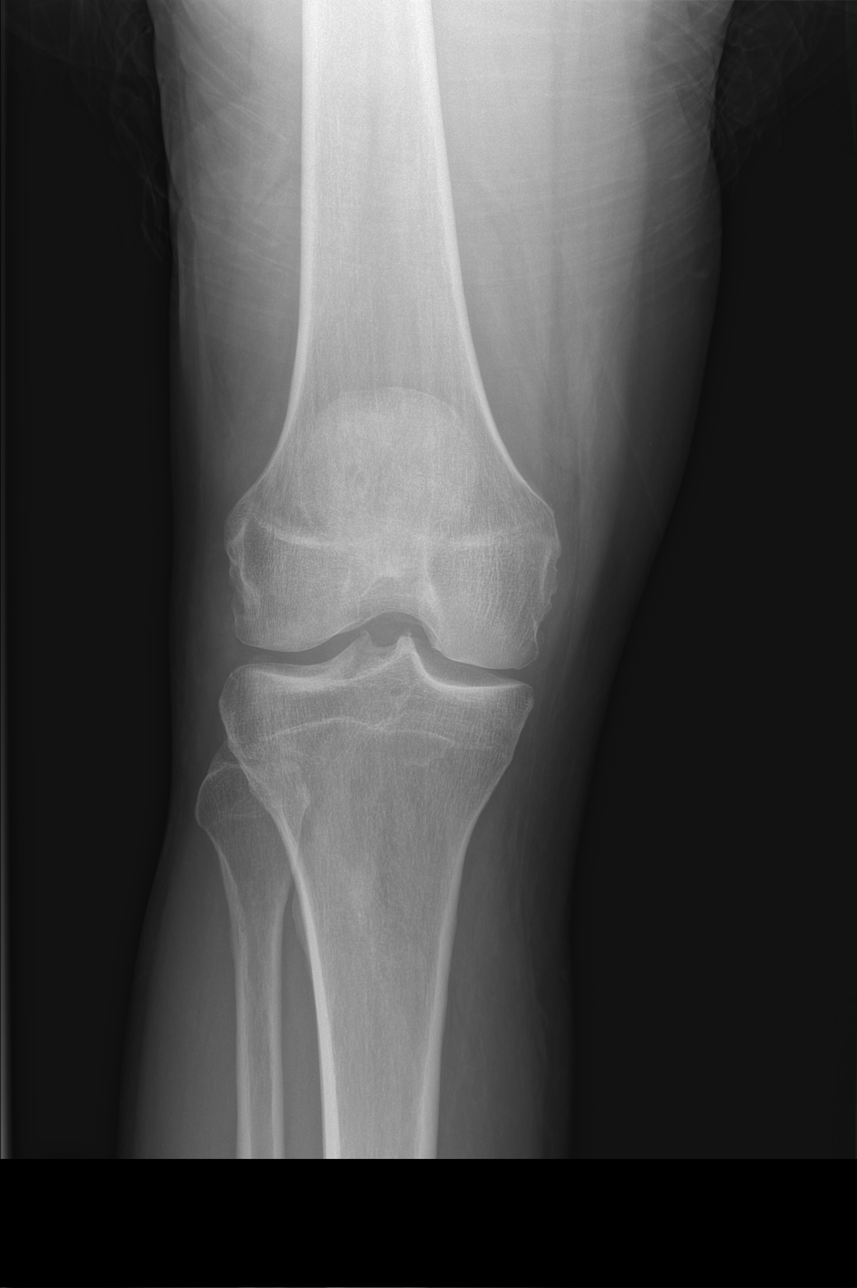

[t knee obl right (1 of 2)]
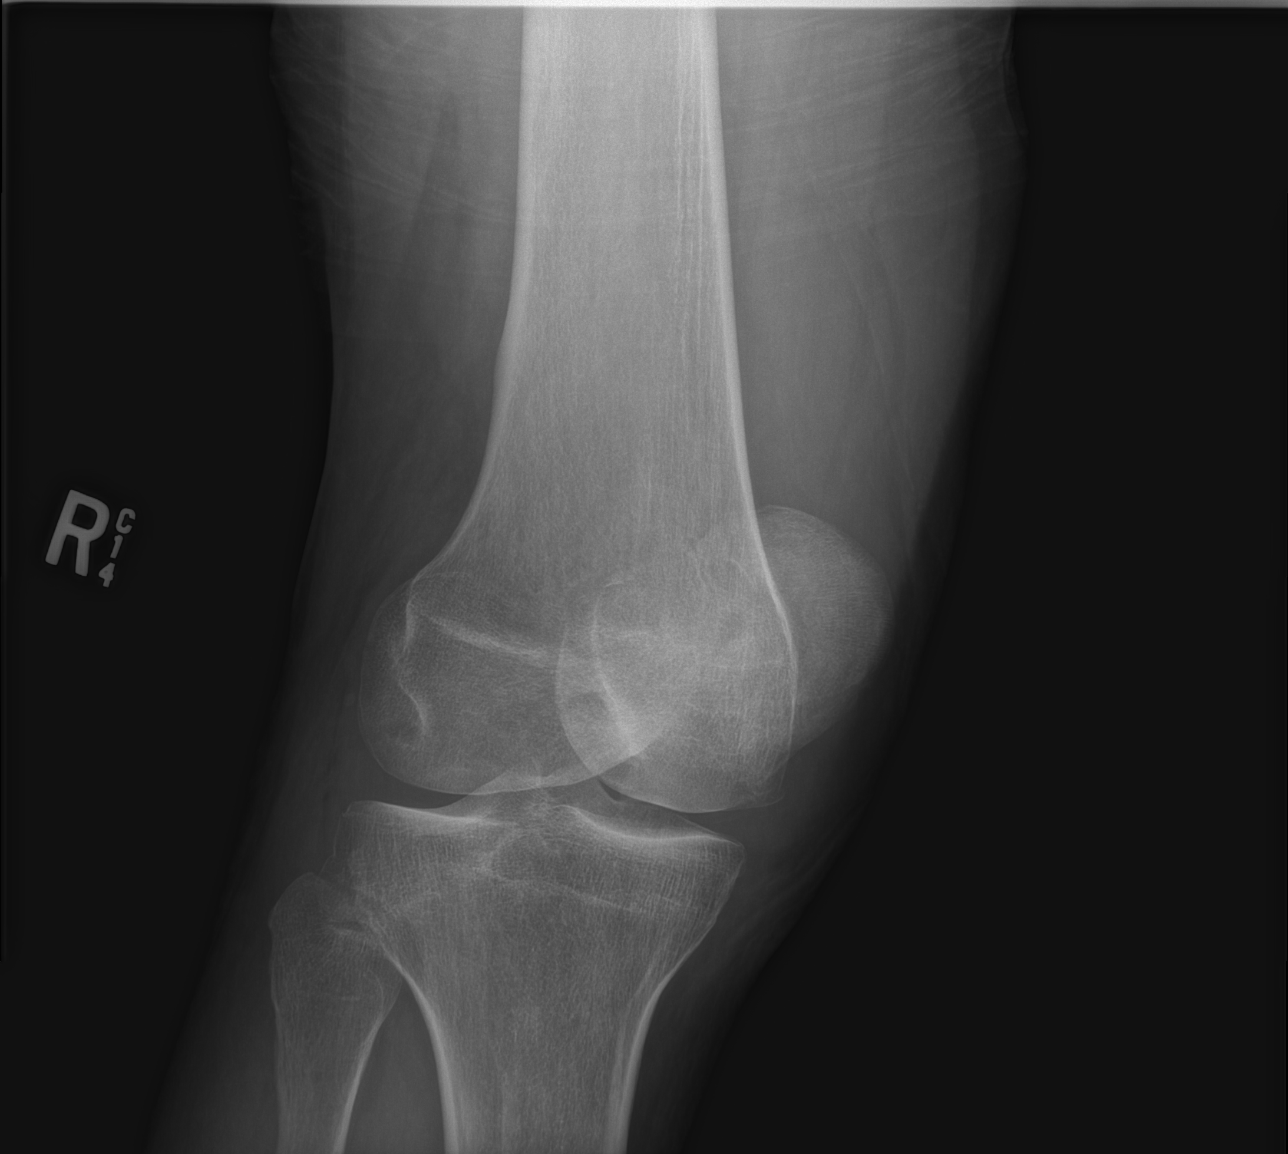

[t knee obl right (2 of 2)]
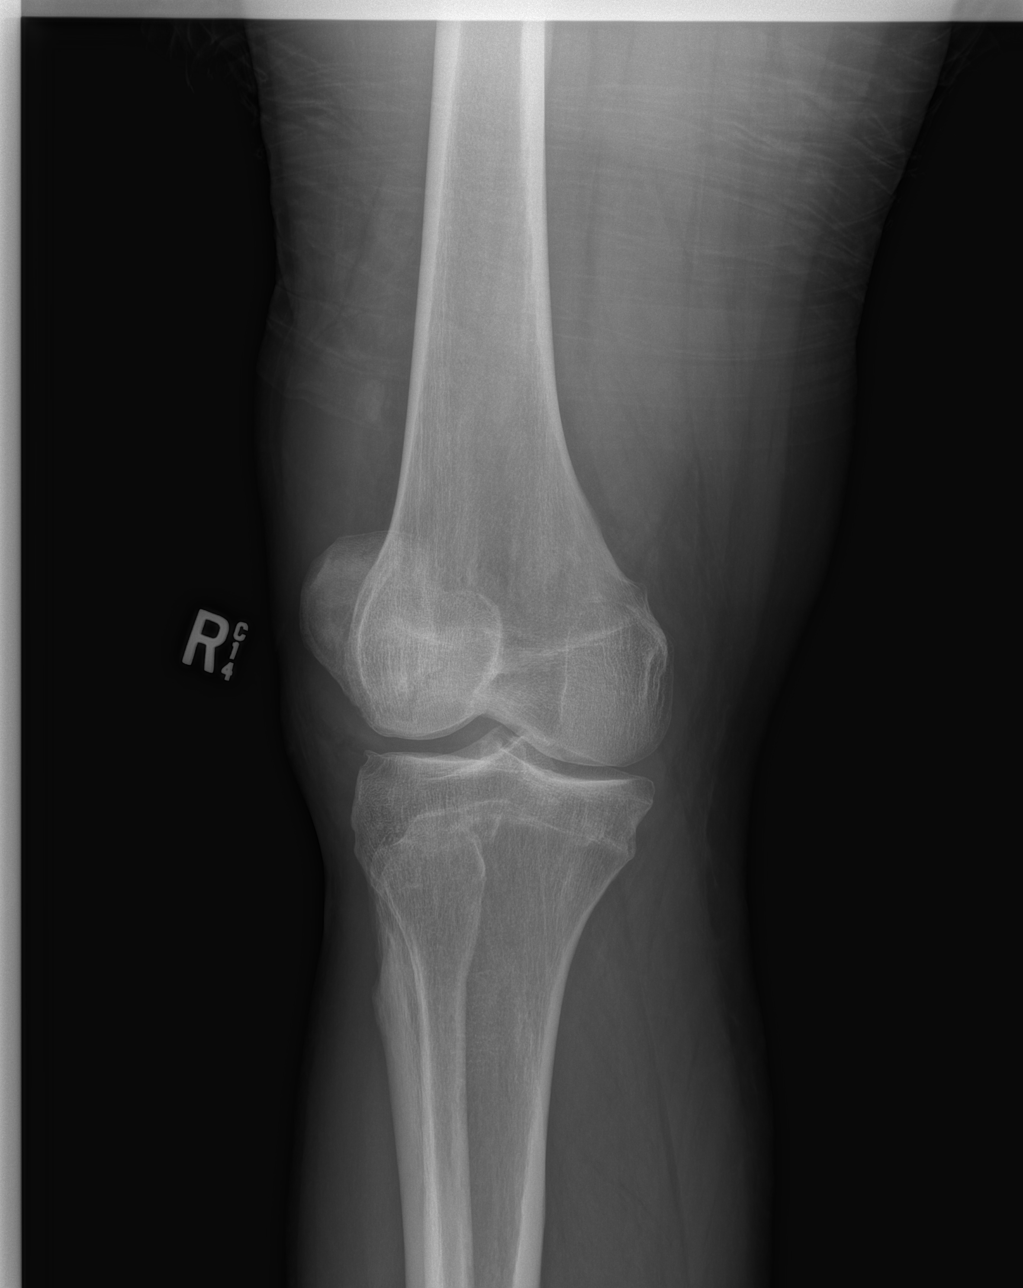

[t knee lat right]
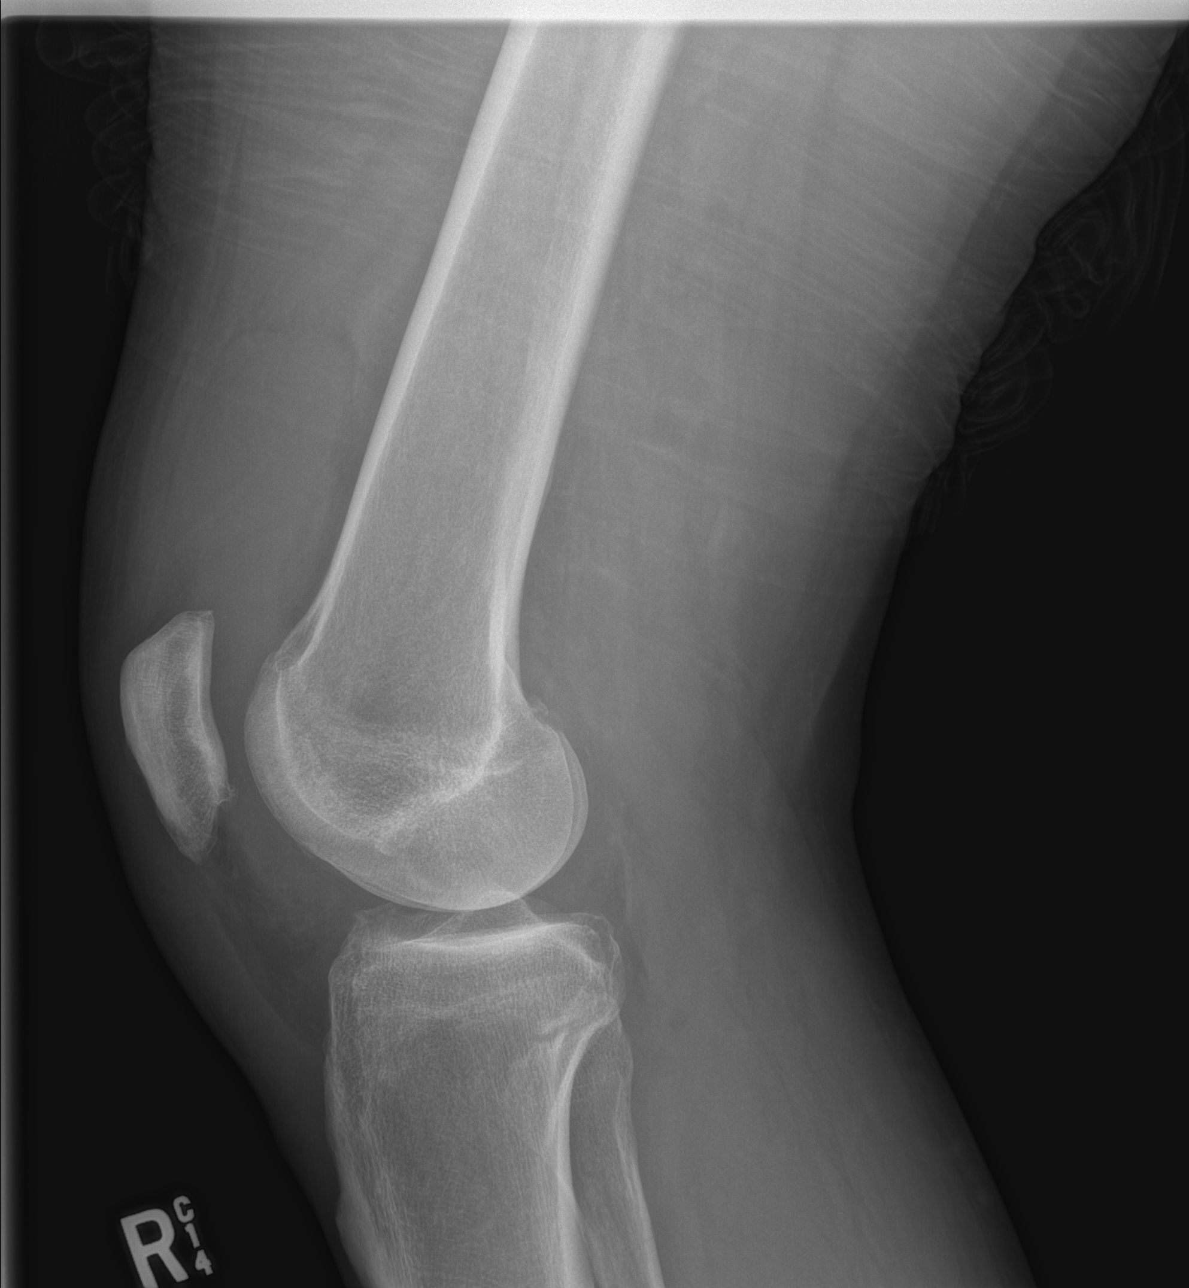

[4 of 4 positions shown; findings below may reference images not displayed]

FINDINGS: Large knee effusion is present. No significant erosions are present.
No acute or focal osseous abnormality is present.
IMPRESSION: 1. Large knee effusion.
2. No significant erosions.

## 2021-05-03 ENCOUNTER — Other Ambulatory Visit: Payer: Self-pay | Admitting: Adult Health

## 2021-05-03 DIAGNOSIS — M1A49X0 Other secondary chronic gout, multiple sites, without tophus (tophi): Secondary | ICD-10-CM

## 2021-06-01 ENCOUNTER — Encounter: Payer: Self-pay | Admitting: Adult Health

## 2021-06-01 ENCOUNTER — Other Ambulatory Visit: Payer: Self-pay | Admitting: Adult Health

## 2021-06-01 ENCOUNTER — Ambulatory Visit: Payer: BC Managed Care – PPO | Admitting: Adult Health

## 2021-06-01 ENCOUNTER — Ambulatory Visit: Payer: BC Managed Care – PPO | Admitting: Internal Medicine

## 2021-06-01 VITALS — BP 130/80 | HR 110 | Temp 97.6°F | Ht 77.0 in | Wt 243.0 lb

## 2021-06-01 DIAGNOSIS — F32A Depression, unspecified: Secondary | ICD-10-CM

## 2021-06-01 DIAGNOSIS — F1010 Alcohol abuse, uncomplicated: Secondary | ICD-10-CM | POA: Diagnosis not present

## 2021-06-01 DIAGNOSIS — F419 Anxiety disorder, unspecified: Secondary | ICD-10-CM

## 2021-06-01 DIAGNOSIS — N529 Male erectile dysfunction, unspecified: Secondary | ICD-10-CM

## 2021-06-01 DIAGNOSIS — I10 Essential (primary) hypertension: Secondary | ICD-10-CM

## 2021-06-01 LAB — HEPATIC FUNCTION PANEL
ALT: 18 U/L (ref 0–53)
AST: 25 U/L (ref 0–37)
Albumin: 4.2 g/dL (ref 3.5–5.2)
Alkaline Phosphatase: 28 U/L — ABNORMAL LOW (ref 39–117)
Bilirubin, Direct: 0.1 mg/dL (ref 0.0–0.3)
Total Bilirubin: 0.6 mg/dL (ref 0.2–1.2)
Total Protein: 7.4 g/dL (ref 6.0–8.3)

## 2021-06-01 MED ORDER — NALTREXONE HCL 50 MG PO TABS: 50.0000 mg | ORAL_TABLET | Freq: Every day | ORAL | 0 refills | Status: DC

## 2021-06-01 MED ORDER — SILDENAFIL CITRATE 20 MG PO TABS: ORAL_TABLET | ORAL | 2 refills | Status: DC

## 2021-06-01 NOTE — Progress Notes (Signed)
Subjective:    Patient ID: Keith Leblanc, male    DOB: 1966-05-18, 55 y.o.   MRN: [id number]  HPI 54 year old male who  has a past medical history of Blood in stool, Chicken pox, Diabetes mellitus ([place]), Erectile dysfunction, Gout, and Hypertension.  He presents to the office today for anxiety and depression. When he was last seen in March 2022 he reports his anxiety and depressive symptoms were under control.  Today he reports that this is not the case.  He continues to take Wellbutrin 100 mg extended release daily and reports "sometimes I take 2".  He is going home from work, drinking a pint of tequila every night and going to bed.  This is the same routine that he does every day.  Sometimes he will go out in the evening or the middle the night and just drive around.  He does not find any joy in his life.  He denies suicidal ideation, but wonders what will happen when he passes away and if he will be easily forgotten.  Does report that he has decreased appetite and has been losing weight because he has not been eating much. Wt Readings from Last 3 Encounters:  06/01/21 243 lb (110.2 kg)  11/30/20 255 lb 12.8 oz (116 kg)  08/23/20 285 lb 12.8 oz (129.6 kg)   Alcohol abuse has been a constant struggle for him.  He understands that he needs to quit but is having a hard time coming to that realization.  Also needs a refill of sildenafil    Review of Systems See HPI   Past Medical History:  Diagnosis Date   Blood in stool    bright red blood    Chicken pox    Diabetes mellitus ([place])    Erectile dysfunction    Gout    Hypertension     Social History   Socioeconomic History   Marital status: Single    Spouse name: Not on file   Number of children: Not on file   Years of education: Not on file   Highest education level: Not on file  Occupational History   Not on file  Tobacco Use   Smoking status: Former   Smokeless tobacco: Never  Vaping Use   Vaping Use: Never used   Substance and Sexual Activity   Alcohol use: Yes    Comment: daily    Drug use: Not Currently   Sexual activity: Yes  Other Topics Concern   Not on file  Social History Narrative   Works for [employer]    Not married    One daughter who does not live with him    Likes to gamble, walk in the parks, travel.    Social Determinants of Health   Financial Resource Strain: Not on file  Food Insecurity: Not on file  Transportation Needs: Not on file  Physical Activity: Not on file  Stress: Not on file  Social Connections: Not on file  Intimate Partner Violence: Not on file    Past Surgical History:  Procedure Laterality Date   APPENDECTOMY  2004   COLONOSCOPY     TOOTH EXTRACTION  07/06/2020    Family History  Problem Relation Age of Onset   Alcohol abuse Father    Hypertension Father    Heart disease Father    Gout Father    Breast cancer Mother    Hypertension Mother    Gout Mother    Healthy Sister  Healthy Daughter    Colon cancer Neg Hx    Esophageal cancer Neg Hx    Rectal cancer Neg Hx    Stomach cancer Neg Hx     No Known Allergies  Current Outpatient Medications on File Prior to Visit  Medication Sig Dispense Refill   allopurinol (ZYLOPRIM) 300 MG tablet TAKE 1 TABLET BY MOUTH  DAILY 90 tablet 3   amLODipine (NORVASC) 10 MG tablet TAKE 1 TABLET BY MOUTH EVERY DAY 30 tablet 0   blood glucose meter kit and supplies Dispense based on patient and insurance preference. Use up to four times daily as directed. (FOR ICD-9 250.00, 250.01). 1 each 0   buPROPion (WELLBUTRIN XL) 300 MG 24 hr tablet TAKE 1 TABLET BY MOUTH  DAILY 90 tablet 3   colchicine 0.6 MG tablet TAKE 1 TABLET BY MOUTH  DAILY. WHEN HAVING FLARES  TAKE 1 TABLET BY MOUTH  TWICE DAILY 180 tablet 0   fenofibrate (TRICOR) 145 MG tablet Take 1 tablet (145 mg total) by mouth daily. 30 tablet 5   Insulin Pen Needle (B-D UF III MINI PEN NEEDLES) 31G X 5 MM MISC USE 4 TIMES DAILY AFTER MEALS AND AT BEDTIME 100  each 11   Lancets (ONETOUCH ULTRASOFT) lancets Test blood sugars as directed. Dx E11.9 100 each 12   LANTUS SOLOSTAR 100 UNIT/ML Solostar Pen INJECT SUBCUTANEOUSLY 30  UNITS DAILY AT 10 PM 30 mL 1   Nebivolol HCl (BYSTOLIC) 20 MG TABS Take 1 tablet (20 mg total) by mouth daily. 90 tablet 3   omeprazole (PRILOSEC) 40 MG capsule TAKE 1 CAPSULE BY MOUTH  DAILY 90 capsule 3   ONE TOUCH ULTRA TEST test strip USE UP TO 4 TIMES DAILY AS DIRECTED 100 each 3   sildenafil (REVATIO) 20 MG tablet TAKE 2-5 TABLETS BY MOUTH AS NEEDED FOR SEXUAL ACTIVITY 50 tablet 2   TRULICITY 1.5 RA/0.7MA SOPN INJECT THE CONTENTS OF 1  PEN SUBCUTANEOUSLY WEEKLY 6 mL 3   No current facility-administered medications on file prior to visit.    BP (!) 146/70   Pulse (!) 110   Temp 97.6 F (36.4 C) (Oral)   Ht _0  (1.956 m)   Wt 243 lb (110.2 kg)   SpO2 96%   BMI 28.82 kg/m       Objective:   Physical Exam Vitals and nursing note reviewed.  Constitutional:      Appearance: Normal appearance.  Skin:    General: Skin is warm and dry.     Capillary Refill: Capillary refill takes less than 2 seconds.  Neurological:     General: No focal deficit present.     Mental Status: He is alert and oriented to person, place, and time.  Psychiatric:        Mood and Affect: Mood normal. Affect is flat.        Speech: Speech is delayed.        Behavior: Behavior is slowed and withdrawn.        Thought Content: Thought content normal.      Assessment & Plan:  1. Anxiety and depression -PHQ-9 score today of 13 and his GAD-7 was 19.  Does not want to go on any additional medication at this time but is willing to see if therapist.  Information given on local therapist to whom he will contact.  2. Erectile dysfunction, unspecified erectile dysfunction type  - sildenafil (REVATIO) 20 MG tablet; TAKE 2-5 TABLETS BY MOUTH AS NEEDED  FOR SEXUAL ACTIVITY  Dispense: 50 tablet; Refill: 2  3. Alcohol abuse -This is his biggest  issue driving a lot of his mental health issues.  He is willing to give naltrexone a try to see if this can help reduce his alcohol dependency.  We will check a liver panel today and prescribe if able. -Follow-up in 1 month after starting medication - Hepatic function panel; Future - Hepatic function panel  Dorothyann Peng, NP

## 2021-06-01 NOTE — Patient Instructions (Addendum)
It was great seeing you today   Please contact   [email or web address]  I will follow up with you once I get your blood work back

## 2021-06-11 DIAGNOSIS — F320 Major depressive disorder, single episode, mild: Secondary | ICD-10-CM | POA: Diagnosis not present

## 2021-06-11 DIAGNOSIS — F1020 Alcohol dependence, uncomplicated: Secondary | ICD-10-CM | POA: Diagnosis not present

## 2021-06-12 ENCOUNTER — Telehealth: Payer: Self-pay | Admitting: Adult Health

## 2021-06-12 NOTE — Telephone Encounter (Signed)
Pt call about a RX  for sildenafil that was not sent to the pharmacy he need it sent to  [facility], [place] Phone:  [phone]  Fax:  [phone]

## 2021-06-13 NOTE — Telephone Encounter (Signed)
Called pt and advised that Rx was sent in already 06/01/2021. Pt stated that he just received the call from the pharmacy no further action was needed!

## 2021-06-19 ENCOUNTER — Telehealth: Payer: Self-pay | Admitting: Adult Health

## 2021-06-19 DIAGNOSIS — I10 Essential (primary) hypertension: Secondary | ICD-10-CM

## 2021-06-19 DIAGNOSIS — N529 Male erectile dysfunction, unspecified: Secondary | ICD-10-CM

## 2021-06-19 NOTE — Telephone Encounter (Signed)
Patient stated that the pharmacy said that they're waiting on a prior authorization from [facility] in order for him to get his refill for sildenafil (REVATIO) 20 MG tablet [[id number]].  Patient could be contacted at [phone].  Please advise.

## 2021-06-20 MED ORDER — AMLODIPINE BESYLATE 10 MG PO TABS: 10.0000 mg | ORAL_TABLET | Freq: Every day | ORAL | 3 refills | Status: DC

## 2021-06-20 MED ORDER — SILDENAFIL CITRATE 20 MG PO TABS: ORAL_TABLET | ORAL | 2 refills | Status: DC

## 2021-06-20 NOTE — Telephone Encounter (Signed)
I have called pt and informed them that the chances of this medication getting approved is not high and if he wants to get the medication for cheaper than 600 for a script then we will send it to [facility] and see.   I have received a rx request from [facility] and I have sent the rx to them per provider okay.

## 2021-07-02 DIAGNOSIS — F1020 Alcohol dependence, uncomplicated: Secondary | ICD-10-CM | POA: Diagnosis not present

## 2021-07-02 DIAGNOSIS — F320 Major depressive disorder, single episode, mild: Secondary | ICD-10-CM | POA: Diagnosis not present

## 2021-07-10 ENCOUNTER — Encounter: Payer: Self-pay | Admitting: Internal Medicine

## 2021-07-10 ENCOUNTER — Other Ambulatory Visit: Payer: Self-pay

## 2021-07-10 ENCOUNTER — Ambulatory Visit (INDEPENDENT_AMBULATORY_CARE_PROVIDER_SITE_OTHER): Payer: BC Managed Care – PPO | Admitting: Internal Medicine

## 2021-07-10 VITALS — BP 158/96 | HR 75 | Ht 77.0 in | Wt 240.6 lb

## 2021-07-10 DIAGNOSIS — Z794 Long term (current) use of insulin: Secondary | ICD-10-CM

## 2021-07-10 DIAGNOSIS — E1165 Type 2 diabetes mellitus with hyperglycemia: Secondary | ICD-10-CM

## 2021-07-10 DIAGNOSIS — E781 Pure hyperglyceridemia: Secondary | ICD-10-CM

## 2021-07-10 DIAGNOSIS — Z6832 Body mass index (BMI) 32.0-32.9, adult: Secondary | ICD-10-CM

## 2021-07-10 DIAGNOSIS — E661 Drug-induced obesity: Secondary | ICD-10-CM

## 2021-07-10 LAB — POCT GLYCOSYLATED HEMOGLOBIN (HGB A1C): Hemoglobin A1C: 4.8 % (ref 4.0–5.6)

## 2021-07-10 MED ORDER — LANTUS SOLOSTAR 100 UNIT/ML ~~LOC~~ SOPN: PEN_INJECTOR | SUBCUTANEOUS | 5 refills | Status: DC

## 2021-07-10 MED ORDER — TRULICITY 0.75 MG/0.5ML ~~LOC~~ SOAJ: SUBCUTANEOUS | 5 refills | Status: DC

## 2021-07-10 NOTE — Progress Notes (Signed)
Patient ID: Keith Leblanc, male   DOB: 01-Jul-1965, 56 y.o.   MRN: [id number]   This visit occurred during the SARS-CoV-2 public health emergency.  Safety protocols were in place, including screening questions prior to the visit, additional usage of staff PPE, and extensive cleaning of exam room while observing appropriate contact time as indicated for disinfecting solutions.   HPI: Keith Leblanc is a 56 y.o.-year-old male, returning for f/u for  DM2, dx in 06/2016 (prev. Prediabetes since 2015-16), insulin-dependent, uncontrolled, without long term complications. Last visit 7 months ago.  Interim history: He still drinks alcohol  - not able to stop >> started to see a counselor, but she is out of network and will try to establish care locally. He has abdominal fulness and discomfort after meals. He avoids full meals.  He is on omeprazole.  He has nausea in am mostly. No diarrhea, some constipation.  He has increased urination at night. He also has blurry vision.  Reviewed history: Patient was diagnosed with diabetes in 07/07/2016, when he presented to the hospital in DKA. An A1c was 12%. His CBG was 1081. Retrospectively, he had increased urination, increased thirst, blurred vision, fatigue, rapid weight loss of 25 lbs. He had a lot of stress at work + drinking A LOT of sweet drinks (4 cans of soda a day + gatorade + juice). He then came off off these but still working on decreasing Alcohol - 4-5 beers a day.  He was started on insulin inhouse, and his CBG is greatly improved afterwards. Less than a month later, his HbA1c decreased to 9.6%.  Reviewed HbA1c levels: Lab Results  Component Value Date   HGBA1C 5.1 11/30/2020   HGBA1C 5.3 03/13/2020   HGBA1C 6.5 (A) 08/31/2019   Pt was on a regimen of: - Metformin 500 mg 0-1x a day with dinner >> could not tolerate this 2/2 nausea - Basaglar 40 units at bedtime - takes 2-3x a week - Novolog 6 units 3x a day, before meals >> stopped  66/5993  Now on: - Trulicity 5.70 >> 1.5 mg weekly - Lantus 30 >> 24 units daily in am  Pt checks his sugars once a day: - am:   110-140, 160 >> 89, 130s, 140 (11 am-12 pm) >> 85-110 - 2h after b'fast: 107-122 >> n/c >> 176 - before lunch:  130-140 (possibly after a snack) >> 78, 120-134 - 2h after lunch: n/c - before dinner: 140-175 >> 120 >> n/c >> 95-110 >> n/c >> 173 - 2h after dinner: n/c >> 120s >> n/c - bedtime: n/c >> 89-100 >> n/c - nighttime: n/c Lowest sugar was 100 >> 89 >> 78; he has hypoglycemia awareness at 80s Highest sugar was 400s ... >> 160 >> 140 >> 176. He was in the ED 05/2018 for hyperglycemia (CBG 400s >> ED: 361). He was having blurry vision then.  Glucometer: One Touch Ultra  Pt's meals are: - Breakfast: 2-3 boiled eggs, sausage, cheese, diet tea - Lunch: salad, chicken or pork, coke zero - Dinner: sea food + veggies + brown rice, coke zero - Snacks: water  -No history of CKD: Lab Results  Component Value Date   BUN 15 08/23/2020   BUN 14 07/13/2020   CREATININE 1.41 08/23/2020   CREATININE 1.35 (H) 07/13/2020   -+ HL: Lab Results  Component Value Date   CHOL 181 08/23/2020   HDL 58.00 08/23/2020   LDLCALC 143 (H) 08/27/2016   LDLDIRECT 73.0 08/23/2020   TRIG 354.0 (  H) 08/23/2020   CHOLHDL 3 08/23/2020  On Fenofibrate.  - last eye exam was in 06/2020: No DR reportedly.   -No numbness and tingling in his feet.  On White Ginseng, Ashwagandha, burdock root.  ROS: + see HPI + Weight loss  I reviewed pt's medications, allergies, PMH, social hx, family hx, and changes were documented in the history of present illness. Otherwise, unchanged from my initial visit note.  Past Medical History:  Diagnosis Date   Blood in stool    bright red blood    Chicken pox    Diabetes mellitus ([place])    Erectile dysfunction    Gout    Hypertension    Past Surgical History:  Procedure Laterality Date   APPENDECTOMY  2004   COLONOSCOPY     TOOTH  EXTRACTION  07/06/2020   Social History   Social History   Marital status: Single    Spouse name: N/A   Number of children: 1 - 70 y/o in 2018   Occupational History   Heritage manager    Social History Main Topics   Smoking status: Former Smoker   Smokeless tobacco: Started to chew tobacco    Alcohol use      Comment: 4-5 beers a day    Drug use: Yes    Types: Marijuana   Sexual activity: Yes   Social History Narrative   Works for [employer]    Not married    One daughter who does not live with him    Likes to gamble, walk in the parks, travel.    Current Outpatient Medications on File Prior to Visit  Medication Sig Dispense Refill   allopurinol (ZYLOPRIM) 300 MG tablet TAKE 1 TABLET BY MOUTH  DAILY 90 tablet 3   amLODipine (NORVASC) 10 MG tablet Take 1 tablet (10 mg total) by mouth daily. 90 tablet 3   blood glucose meter kit and supplies Dispense based on patient and insurance preference. Use up to four times daily as directed. (FOR ICD-9 250.00, 250.01). 1 each 0   buPROPion (WELLBUTRIN XL) 300 MG 24 hr tablet TAKE 1 TABLET BY MOUTH  DAILY 90 tablet 3   colchicine 0.6 MG tablet TAKE 1 TABLET BY MOUTH  DAILY. WHEN HAVING FLARES  TAKE 1 TABLET BY MOUTH  TWICE DAILY 180 tablet 0   fenofibrate (TRICOR) 145 MG tablet Take 1 tablet (145 mg total) by mouth daily. 30 tablet 5   Insulin Pen Needle (B-D UF III MINI PEN NEEDLES) 31G X 5 MM MISC USE 4 TIMES DAILY AFTER MEALS AND AT BEDTIME 100 each 11   Lancets (ONETOUCH ULTRASOFT) lancets Test blood sugars as directed. Dx E11.9 100 each 12   LANTUS SOLOSTAR 100 UNIT/ML Solostar Pen INJECT SUBCUTANEOUSLY 30  UNITS DAILY AT 10 PM 30 mL 1   naltrexone (DEPADE) 50 MG tablet Take 1 tablet (50 mg total) by mouth daily. 30 tablet 0   Nebivolol HCl (BYSTOLIC) 20 MG TABS Take 1 tablet (20 mg total) by mouth daily. 90 tablet 3   omeprazole (PRILOSEC) 40 MG capsule TAKE 1 CAPSULE BY MOUTH  DAILY 90 capsule 3   ONE TOUCH ULTRA TEST test strip  USE UP TO 4 TIMES DAILY AS DIRECTED 100 each 3   sildenafil (REVATIO) 20 MG tablet TAKE 2-5 TABLETS BY MOUTH AS NEEDED FOR SEXUAL ACTIVITY 50 tablet 2   TRULICITY 1.5 XT/0.5WP SOPN INJECT THE CONTENTS OF 1  PEN SUBCUTANEOUSLY WEEKLY 6 mL 3   No  current facility-administered medications on file prior to visit.   No Known Allergies Family History  Problem Relation Age of Onset   Alcohol abuse Father    Hypertension Father    Heart disease Father    Gout Father    Breast cancer Mother    Hypertension Mother    Gout Mother    Healthy Sister    Healthy Daughter    Colon cancer Neg Hx    Esophageal cancer Neg Hx    Rectal cancer Neg Hx    Stomach cancer Neg Hx   Pt has FH of DM in mother.  PE: BP (!) 158/96 (BP Location: Left Arm, Patient Position: Sitting, Cuff Size: Normal)    Pulse 75    Ht $R'6\' 5"'KO$  (1.956 m)    Wt 240 lb 9.6 oz (109.1 kg)    SpO2 99%    BMI 28.53 kg/m  Wt Readings from Last 3 Encounters:  07/10/21 240 lb 9.6 oz (109.1 kg)  06/01/21 243 lb (110.2 kg)  11/30/20 255 lb 12.8 oz (116 kg)   Constitutional: overweight, in NAD Eyes: PERRLA, EOMI, no exophthalmos ENT: moist mucous membranes, no thyromegaly, no cervical lymphadenopathy Cardiovascular: RRR, No MRG Respiratory: CTA B Musculoskeletal: no deformities, strength intact in all 4 Skin: moist, warm, no rashes Neurological: + Mild tremor with outstretched hands, DTR normal in all 4  ASSESSMENT: 1. DM2, -insulin-dependent, uncontrolled, without long term complications, but with hyperglycemia  He has type 2, rather than type 1 diabetes: Component     Latest Ref Rng & Units 08/27/2016  Glucose     70 - 99 mg/dL 118 (H)  Pancreatic Islet Cell Antibody     <5 JDF Units <5  Glutamic Acid Decarb Ab     <5 IU/mL <5  C-Peptide     0.80 - 3.85 ng/mL 1.45   2. Obesity class 1 BMI Classification: < 18.5 underweight  18.5-24.9 normal weight  25.0-29.9 overweight  30.0-34.9 class I obesity  35.0-39.9 class II  obesity  ? 40.0 class III obesity   3.  Hypertriglyceridemia  PLAN:  1. Patient with history of uncontrolled diabetes, on basal insulin and weekly GLP-1 receptor agonist, with significantly improved diabetes control after he started to improve his diet and reduce alcohol in 2019.  At last visit, HbA1c was 5.1%, excellent.  However, this appears to be lower than expected from his sugars at home (possibly due to use of colchicine).  At last visit, he forgot his log but per his recall, sugars were abdomen slightly higher than goal in the morning and they were better controlled later in the day.  The highest blood sugar he saw was 140.  No lows.  We discussed about continuing the same regimen at that time but let me know if he sees any low blood sugars, under 70s, in which case we should have reduce his insulin dose.  He did not contact me since last visit. -At this visit, sugars remain well controlled in the morning and they may increase after a sweet snack or OJ up to 170s.  We discussed about trying to stop juice.  However, at this visit, he tells me that he is actually avoiding meals and whenever he eats he is trying to eat very little because he has a sensation of discomfort every time he eats.  He also has constipation and nausea.  Therefore, at today's visit, I advised him to try to reduce Trulicity to 2.67 mg daily.  If the  discomfort improves, we may need to give him a try without the GLP-1 receptor agonist.  If his symptoms do not appear to respond to decreasing and possibly stopping the GLP-1 receptor agonist, they may be related to his alcohol use (gastritis, ulcer, etc).  Discussed about the importance of stopping this. -Since his HbA1c is even lower than before (see below), I advised him to also decrease his Lantus dose slightly, even though he does not have low blood sugars. - I suggested to:  Patient Instructions  Please decrease: - Trulicity 7.40 mg weekly - Lantus 20 units in  a.m.  Please return in 6 months with your sugar log.   - we checked his HbA1c: 4.8% (lower) - advised to check sugars at different times of the day - 1x a day, rotating check times - advised for yearly eye exams >> he is UTD but is due - return to clinic in 6 months  2. Obesity class 1 -We will continue the GLP-1 receptor agonist which should also help with weight loss -He lost 30 pounds before last visit -He lost 15 more pounds since then  3. HTG -We discussed in the past about reducing/stopping alcohol, which was raising his triglycerides -Reviewed latest lipid panel from 08/2020: LDL improved, triglycerides high: Lab Results  Component Value Date   CHOL 181 08/23/2020   HDL 58.00 08/23/2020   LDLCALC 143 (H) 08/27/2016   LDLDIRECT 73.0 08/23/2020   TRIG 354.0 (H) 08/23/2020   CHOLHDL 3 08/23/2020  -On fenofibrate 145 mg daily.  He tolerates this well.  Philemon Kingdom, MD PhD [facility] Endocrinology

## 2021-07-10 NOTE — Patient Instructions (Addendum)
Please decrease: - Trulicity 0.62 mg weekly - Lantus 20 units in a.m.  Please return in 6 months with your sugar log.

## 2021-07-11 ENCOUNTER — Other Ambulatory Visit (HOSPITAL_COMMUNITY): Payer: Self-pay

## 2021-07-12 ENCOUNTER — Telehealth: Payer: Self-pay | Admitting: Adult Health

## 2021-07-12 NOTE — Telephone Encounter (Signed)
FYI  Spoke to pt and he stated he has been having the anxiety issues and \\he  had a panic attack yesterday. Pt claimed he had to leave work because it was so bad. Pt stated that he is taking off the rest of the week out and that was the reason for the FMLA PPW. Pt wanted PCP to know the reason for the PPW.

## 2021-07-12 NOTE — Telephone Encounter (Signed)
Pt is calling and would like a return call. Pt will be getting FMLA paperwork and has some questions

## 2021-07-24 ENCOUNTER — Telehealth: Payer: Self-pay | Admitting: Adult Health

## 2021-07-24 NOTE — Telephone Encounter (Signed)
Noted. FYI 

## 2021-07-24 NOTE — Telephone Encounter (Signed)
FMLA paperwork  from [employer] was fax to office and put in provider folder

## 2021-07-25 DIAGNOSIS — Z0279 Encounter for issue of other medical certificate: Secondary | ICD-10-CM

## 2021-07-26 NOTE — Telephone Encounter (Signed)
PPW faxed with confirmation.

## 2021-07-31 ENCOUNTER — Other Ambulatory Visit: Payer: Self-pay | Admitting: Adult Health

## 2021-07-31 DIAGNOSIS — I10 Essential (primary) hypertension: Secondary | ICD-10-CM

## 2021-08-02 NOTE — Telephone Encounter (Signed)
Patient called in stating that he faxed over a new set of FMLA forms on 02/07 and wanted to know if [facility] received it.   Patient is requesting a phone call back from [place] at [phone].  Please advise.

## 2021-08-07 NOTE — Telephone Encounter (Signed)
Patient notified of update  and verbalized understanding. 

## 2021-08-07 NOTE — Telephone Encounter (Signed)
Please advise 

## 2021-08-07 NOTE — Telephone Encounter (Signed)
Patient called in stating that [facility] signed off on continuous when he should have signed off on Intermitted leave. Patient is requesting for Tommi Rumps to change it to Intermitted and to fax it over when its completed.  Patient would like to be contacted at [phone].  Please advise.

## 2021-08-13 ENCOUNTER — Telehealth: Payer: Self-pay | Admitting: Adult Health

## 2021-08-13 NOTE — Telephone Encounter (Signed)
Pt call and stated the FMLA form that was fax of for him he stated that it was not received and want you to refax it.

## 2021-08-17 NOTE — Telephone Encounter (Signed)
PW faxed to hand written number on top of form 3 times with confirmation number. Pt aware of update. Pt also wanted me to send PPW to fax located at bottom of paper [phone]. PPW faxed to requested number with confirmation. Pt advised to reach back out if needed and verbalized understanding.

## 2021-08-22 NOTE — Telephone Encounter (Signed)
Called pt to see if the FMLA PPW went through but no answer.  ?

## 2021-08-30 ENCOUNTER — Other Ambulatory Visit: Payer: Self-pay | Admitting: Adult Health

## 2021-08-31 ENCOUNTER — Encounter: Payer: BC Managed Care – PPO | Admitting: Adult Health

## 2021-08-31 NOTE — Progress Notes (Deleted)
? ?  Subjective:  ? ? Patient ID: Keith Leblanc, male    DOB: 02/15/1966, 56 y.o.   MRN: [id number] ? ?HPI ? ?Patient presents for yearly preventative medicine examination. He is a pleasant 56 year old male who  has a past medical history of Blood in stool, Chicken pox, Diabetes mellitus ([place]), Erectile dysfunction, Gout, and Hypertension. ? ?DM -managed by endocrinology, Dr. Cruzita Lederer.  Currently prescribed Lantus 30 units daily and Trulicity 6.71 weekly ?Lab Results  ?Component Value Date  ? HGBA1C 4.8 07/10/2021  ? ?Hypertension -managed with Norvasc 10 mg daily.  He does check his blood pressures at home and reports readings ?BP Readings from Last 3 Encounters:  ?07/10/21 (!) 158/96  ?06/01/21 130/80  ?11/30/20 (!) 140/100  ? ?Hyperlipidemia -currently prescribed Tricor 145 mg daily. ? ?Lab Results  ?Component Value Date  ? CHOL 181 08/23/2020  ? HDL 58.00 08/23/2020  ? LDLCALC 143 (H) 08/27/2016  ? LDLDIRECT 73.0 08/23/2020  ? TRIG 354.0 (H) 08/23/2020  ? CHOLHDL 3 08/23/2020  ? ?Gout -takes allopurinol 300 mg daily and colchicine as needed for acute flares.  He is followed by rheumatology ? ?GERD-  takes Prilosec 40 mg daily.  Feels well controlled ? ?Anxiety and Depression -currently managed with Wellbutrin 150 mg daily.  When he was last seen in December 2022 his PHQ-9 score was 13 and GAD-7 was 19.  He did not want to go on any type of additional therapy at this time.  He was looking for a counselor and a recommendation was made. ? ?Alcohol Abuse -when he was seen in December 2022 he was started on naltrexone to try and reduce his alcohol dependency.  Since starting this medication he reports ? ?All immunizations and health maintenance protocols were reviewed with the patient and needed orders were placed. ? ?Appropriate screening laboratory values were ordered for the patient including screening of hyperlipidemia, renal function and hepatic function. ?If indicated by BPH, a PSA was ordered. ? ?Medication  reconciliation,  past medical history, social history, problem list and allergies were reviewed in detail with the patient ? ?Goals were established with regard to weight loss, exercise, and  diet in compliance with medications ? ?End of life planning was discussed. ? ? ?Review of Systems ? ?   ?Objective:  ? Physical Exam ? ? ? ? ?   ?Assessment & Plan:  ? ? ?

## 2021-09-12 ENCOUNTER — Ambulatory Visit (INDEPENDENT_AMBULATORY_CARE_PROVIDER_SITE_OTHER): Payer: BC Managed Care – PPO | Admitting: Adult Health

## 2021-09-12 ENCOUNTER — Other Ambulatory Visit: Payer: Self-pay | Admitting: Adult Health

## 2021-09-12 ENCOUNTER — Encounter: Payer: Self-pay | Admitting: Adult Health

## 2021-09-12 VITALS — BP 130/80 | Temp 99.5°F | Ht 77.0 in | Wt 243.0 lb

## 2021-09-12 DIAGNOSIS — Z125 Encounter for screening for malignant neoplasm of prostate: Secondary | ICD-10-CM

## 2021-09-12 DIAGNOSIS — F32A Depression, unspecified: Secondary | ICD-10-CM

## 2021-09-12 DIAGNOSIS — I10 Essential (primary) hypertension: Secondary | ICD-10-CM | POA: Diagnosis not present

## 2021-09-12 DIAGNOSIS — E781 Pure hyperglyceridemia: Secondary | ICD-10-CM

## 2021-09-12 DIAGNOSIS — Z1159 Encounter for screening for other viral diseases: Secondary | ICD-10-CM | POA: Diagnosis not present

## 2021-09-12 DIAGNOSIS — Z0000 Encounter for general adult medical examination without abnormal findings: Secondary | ICD-10-CM | POA: Diagnosis not present

## 2021-09-12 DIAGNOSIS — F419 Anxiety disorder, unspecified: Secondary | ICD-10-CM | POA: Diagnosis not present

## 2021-09-12 DIAGNOSIS — Z794 Long term (current) use of insulin: Secondary | ICD-10-CM

## 2021-09-12 DIAGNOSIS — F339 Major depressive disorder, recurrent, unspecified: Secondary | ICD-10-CM

## 2021-09-12 DIAGNOSIS — E1165 Type 2 diabetes mellitus with hyperglycemia: Secondary | ICD-10-CM

## 2021-09-12 DIAGNOSIS — M1A49X0 Other secondary chronic gout, multiple sites, without tophus (tophi): Secondary | ICD-10-CM

## 2021-09-12 DIAGNOSIS — D649 Anemia, unspecified: Secondary | ICD-10-CM

## 2021-09-12 DIAGNOSIS — F1010 Alcohol abuse, uncomplicated: Secondary | ICD-10-CM | POA: Diagnosis not present

## 2021-09-12 LAB — CBC WITH DIFFERENTIAL/PLATELET
Basophils Absolute: 0 10*3/uL (ref 0.0–0.1)
Basophils Relative: 0.5 % (ref 0.0–3.0)
Eosinophils Absolute: 0.1 10*3/uL (ref 0.0–0.7)
Eosinophils Relative: 1.6 % (ref 0.0–5.0)
HCT: 27.5 % — ABNORMAL LOW (ref 39.0–52.0)
Hemoglobin: 9 g/dL — ABNORMAL LOW (ref 13.0–17.0)
Lymphocytes Relative: 39.5 % (ref 12.0–46.0)
Lymphs Abs: 1.6 10*3/uL (ref 0.7–4.0)
MCHC: 32.6 g/dL (ref 30.0–36.0)
MCV: 80.6 fl (ref 78.0–100.0)
Monocytes Absolute: 0.2 10*3/uL (ref 0.1–1.0)
Monocytes Relative: 6 % (ref 3.0–12.0)
Neutro Abs: 2.1 10*3/uL (ref 1.4–7.7)
Neutrophils Relative %: 52.4 % (ref 43.0–77.0)
Platelets: 457 10*3/uL — ABNORMAL HIGH (ref 150.0–400.0)
RBC: 3.41 Mil/uL — ABNORMAL LOW (ref 4.22–5.81)
RDW: 16.3 % — ABNORMAL HIGH (ref 11.5–15.5)
WBC: 4.1 10*3/uL (ref 4.0–10.5)

## 2021-09-12 LAB — COMPREHENSIVE METABOLIC PANEL
ALT: 17 U/L (ref 0–53)
AST: 30 U/L (ref 0–37)
Albumin: 4.4 g/dL (ref 3.5–5.2)
Alkaline Phosphatase: 35 U/L — ABNORMAL LOW (ref 39–117)
BUN: 16 mg/dL (ref 6–23)
CO2: 30 mEq/L (ref 19–32)
Calcium: 9.1 mg/dL (ref 8.4–10.5)
Chloride: 102 mEq/L (ref 96–112)
Creatinine, Ser: 1.75 mg/dL — ABNORMAL HIGH (ref 0.40–1.50)
GFR: 43.87 mL/min — ABNORMAL LOW (ref >60.00)
Glucose, Bld: 79 mg/dL (ref 70–99)
Potassium: 3.7 mEq/L (ref 3.5–5.1)
Sodium: 142 mEq/L (ref 135–145)
Total Bilirubin: 0.5 mg/dL (ref 0.2–1.2)
Total Protein: 7.1 g/dL (ref 6.0–8.3)

## 2021-09-12 LAB — PSA: PSA: 0.43 ng/mL (ref 0.10–4.00)

## 2021-09-12 LAB — LIPID PANEL
Cholesterol: 165 mg/dL (ref 0–200)
HDL: 66.3 mg/dL (ref >39.00)
NonHDL: 98.82
Total CHOL/HDL Ratio: 2
Triglycerides: 220 mg/dL — ABNORMAL HIGH (ref 0.0–149.0)
VLDL: 44 mg/dL — ABNORMAL HIGH (ref 0.0–40.0)

## 2021-09-12 LAB — TSH: TSH: 1.12 u[IU]/mL (ref 0.35–5.50)

## 2021-09-12 LAB — LDL CHOLESTEROL, DIRECT: Direct LDL: 72 mg/dL

## 2021-09-12 MED ORDER — NEBIVOLOL HCL 20 MG PO TABS: 1.0000 | ORAL_TABLET | Freq: Every day | ORAL | 3 refills | Status: DC

## 2021-09-12 NOTE — Progress Notes (Signed)
? ?Subjective:  ? ? Patient ID: Keith Leblanc, male    DOB: [birth year], 56 y.o.   MRN: [id number] ? ?HPI ? ?Patient presents for yearly preventative medicine examination. He is a pleasant 56 year old male who  has a past medical history of Blood in stool, Chicken pox, Diabetes mellitus ([place]), Erectile dysfunction, Gout, and Hypertension. ? ?Diabetes mellitus type 2-currently prescribed Lantus 30 units daily and Trulicity 6.60YT weekly. ?Lab Results  ?Component Value Date  ? HGBA1C 4.8 07/10/2021  ? ?Hypertension-takes Norvasc 10 mg daily and Nebivolol 20 mg daily  daily.  He denies dizziness, lightheadedness, blurred vision, or headaches. ?BP Readings from Last 3 Encounters:  ?09/12/21 130/80  ?07/10/21 (!) 158/96  ?06/01/21 130/80  ? ?Hyperlipidemia-currently prescribed Tricor 145 mg.  He was taking Lipitor in the past but decided that he no longer wanted to take this medication. ?Lab Results  ?Component Value Date  ? CHOL 181 08/23/2020  ? HDL 58.00 08/23/2020  ? LDLCALC 143 (H) 08/27/2016  ? LDLDIRECT 73.0 08/23/2020  ? TRIG 354.0 (H) 08/23/2020  ? CHOLHDL 3 08/23/2020  ? ?Gout-takes allopurinol 300 mg daily and colchicine as needed for acute gout flare.  He is followed by rheumatology on a routine basis ? ?GERD-takes Prilosec 40 mg daily ? ?Anxiety and depression-was last seen in December 2022, currently on Wellbutrin 300 mg extended release daily.  When he was last seen he reported worsening anxiety and depressive symptoms.  His PHQ-9 score was 13 and GAD-7 score was 19.  He did not want to go on any additional medication at this time but was willing to see a therapist. He did see a therapist and found it useful but could not afford the therapist as they were not in his network. He would like to be referred elsewhere. Feels as though depression is the same but that anxiety is improved.  ? ?Alcohol abuse -longstanding history of chronic alcohol abuse.  Was drinking about a pint of tequila every night.  In  December 2022 he was started on naltrexone to help reduce his alcohol dependency.  He never followed up 1 month after starting the medication. Reports that he tried the medication once and it caused severe vomiting. He was not drinking when he took the medication. Does not want to go on any additional medication as this time.  ? ?All immunizations and health maintenance protocols were reviewed with the patient and needed orders were placed. ? ?Appropriate screening laboratory values were ordered for the patient including screening of hyperlipidemia, renal function and hepatic function. ?If indicated by BPH, a PSA was ordered. ? ?Medication reconciliation,  past medical history, social history, problem list and allergies were reviewed in detail with the patient ? ?Goals were established with regard to weight loss, exercise, and  diet in compliance with medications ?Wt Readings from Last 3 Encounters:  ?09/12/21 243 lb (110.2 kg)  ?07/10/21 240 lb 9.6 oz (109.1 kg)  ?06/01/21 243 lb (110.2 kg)  ? ? ?Review of Systems  ?Constitutional: Negative.   ?HENT: Negative.    ?Eyes: Negative.   ?Respiratory: Negative.    ?Cardiovascular: Negative.   ?Gastrointestinal: Negative.   ?Endocrine: Negative.   ?Genitourinary: Negative.   ?Musculoskeletal: Negative.   ?Skin: Negative.   ?Allergic/Immunologic: Negative.   ?Neurological: Negative.   ?Hematological: Negative.   ?Psychiatric/Behavioral: Negative.    ?All other systems reviewed and are negative. ? ?Past Medical History:  ?Diagnosis Date  ? Blood in stool   ? bright red  blood   ? Chicken pox   ? Diabetes mellitus ([facility])   ? Erectile dysfunction   ? Gout   ? Hypertension   ? ? ?Social History  ? ?Socioeconomic History  ? Marital status: Single  ?  Spouse name: Not on file  ? Number of children: Not on file  ? Years of education: Not on file  ? Highest education level: Not on file  ?Occupational History  ? Not on file  ?Tobacco Use  ? Smoking status: Former  ? Smokeless  tobacco: Never  ?Vaping Use  ? Vaping Use: Never used  ?Substance and Sexual Activity  ? Alcohol use: Yes  ?  Comment: daily   ? Drug use: Not Currently  ? Sexual activity: Yes  ?Other Topics Concern  ? Not on file  ?Social History Narrative  ? Works for [employer]   ? Not married   ? One daughter who does not live with him   ? Likes to gamble, walk in the parks, travel.   ? ?Social Determinants of Health  ? ?Financial Resource Strain: Not on file  ?Food Insecurity: Not on file  ?Transportation Needs: Not on file  ?Physical Activity: Not on file  ?Stress: Not on file  ?Social Connections: Not on file  ?Intimate Partner Violence: Not on file  ? ? ?Past Surgical History:  ?Procedure Laterality Date  ? APPENDECTOMY  2004  ? COLONOSCOPY    ? TOOTH EXTRACTION  07/06/2020  ? ? ?Family History  ?Problem Relation Age of Onset  ? Alcohol abuse Father   ? Hypertension Father   ? Heart disease Father   ? Gout Father   ? Breast cancer Mother   ? Hypertension Mother   ? Gout Mother   ? Healthy Sister   ? Healthy Daughter   ? Colon cancer Neg Hx   ? Esophageal cancer Neg Hx   ? Rectal cancer Neg Hx   ? Stomach cancer Neg Hx   ? ? ?No Known Allergies ? ?Current Outpatient Medications on File Prior to Visit  ?Medication Sig Dispense Refill  ? allopurinol (ZYLOPRIM) 300 MG tablet TAKE 1 TABLET BY MOUTH  DAILY 90 tablet 3  ? amLODipine (NORVASC) 10 MG tablet TAKE 1 TABLET BY MOUTH EVERY DAY 90 tablet 0  ? blood glucose meter kit and supplies Dispense based on patient and insurance preference. Use up to four times daily as directed. (FOR ICD-9 250.00, 250.01). 1 each 0  ? buPROPion (WELLBUTRIN XL) 300 MG 24 hr tablet TAKE 1 TABLET BY MOUTH  DAILY 90 tablet 3  ? colchicine 0.6 MG tablet TAKE 1 TABLET BY MOUTH  DAILY. WHEN HAVING FLARES  TAKE 1 TABLET BY MOUTH  TWICE DAILY 180 tablet 0  ? Dulaglutide (TRULICITY) 0.75 MG/0.5ML SOPN Inject 0.75 mg in am weekly under skin 2 mL 5  ? insulin glargine (LANTUS SOLOSTAR) 100 UNIT/ML Solostar Pen  Inject 20 units under skin daily 15 mL 5  ? Insulin Pen Needle (B-D UF III MINI PEN NEEDLES) 31G X 5 MM MISC USE 4 TIMES DAILY AFTER MEALS AND AT BEDTIME 100 each 11  ? Lancets (ONETOUCH ULTRASOFT) lancets Test blood sugars as directed. Dx E11.9 100 each 12  ? omeprazole (PRILOSEC) 40 MG capsule TAKE 1 CAPSULE BY MOUTH  DAILY 90 capsule 3  ? ONE TOUCH ULTRA TEST test strip USE UP TO 4 TIMES DAILY AS DIRECTED 100 each 3  ? sildenafil (REVATIO) 20 MG tablet TAKE 2-5 TABLETS BY   MOUTH AS NEEDED FOR SEXUAL ACTIVITY 50 tablet 2  ? ?No current facility-administered medications on file prior to visit.  ? ? ?BP 130/80   Temp 99.5 ?F (37.5 ?C) (Oral)   Ht 6' 5" (1.956 m)   Wt 243 lb (110.2 kg)   BMI 28.82 kg/m?  ? ? ?   ?Objective:  ? Physical Exam ?Vitals and nursing note reviewed.  ?Constitutional:   ?   General: He is not in acute distress. ?   Appearance: Normal appearance. He is well-developed and normal weight.  ?HENT:  ?   Head: Normocephalic and atraumatic.  ?   Right Ear: Tympanic membrane, ear canal and external ear normal. There is no impacted cerumen.  ?   Left Ear: Tympanic membrane, ear canal and external ear normal. There is no impacted cerumen.  ?   Nose: Nose normal. No congestion or rhinorrhea.  ?   Mouth/Throat:  ?   Mouth: Mucous membranes are moist.  ?   Pharynx: Oropharynx is clear. No oropharyngeal exudate or posterior oropharyngeal erythema.  ?Eyes:  ?   General:     ?   Right eye: No discharge.     ?   Left eye: No discharge.  ?   Extraocular Movements: Extraocular movements intact.  ?   Conjunctiva/sclera: Conjunctivae normal.  ?   Pupils: Pupils are equal, round, and reactive to light.  ?Neck:  ?   Vascular: No carotid bruit.  ?   Trachea: No tracheal deviation.  ?Cardiovascular:  ?   Rate and Rhythm: Normal rate and regular rhythm.  ?   Pulses: Normal pulses.  ?   Heart sounds: Normal heart sounds. No murmur heard. ?  No friction rub. No gallop.  ?Pulmonary:  ?   Effort: Pulmonary effort is  normal. No respiratory distress.  ?   Breath sounds: Normal breath sounds. No stridor. No wheezing, rhonchi or rales.  ?Chest:  ?   Chest wall: No tenderness.  ?Abdominal:  ?   General: Bowel sounds are normal. There is no

## 2021-09-12 NOTE — Patient Instructions (Addendum)
[place]  ?[phone]. ? ?[facility]  ?Address: [address], [place], [place] [address] ?Phone: [phone] ? ?It was great seeing you today  ? ?We will follow up with you regarding your lab work  ? ?Please let me know if you need anything  ? ? ? ?

## 2021-09-13 ENCOUNTER — Other Ambulatory Visit: Payer: Self-pay

## 2021-09-13 ENCOUNTER — Telehealth: Payer: Self-pay | Admitting: Adult Health

## 2021-09-13 LAB — HEPATITIS C ANTIBODY
Hepatitis C Ab: NONREACTIVE
SIGNAL TO CUT-OFF: <0.02 (ref <1.00)

## 2021-09-13 MED ORDER — FENOFIBRATE 145 MG PO TABS
145.0000 mg | ORAL_TABLET | Freq: Every day | ORAL | 1 refills | Status: DC
Start: 2021-09-13 — End: 2024-03-12

## 2021-09-13 NOTE — Telephone Encounter (Signed)
See result note.  

## 2021-09-13 NOTE — Telephone Encounter (Signed)
Pt return your call and want a call back. ?

## 2021-09-17 ENCOUNTER — Other Ambulatory Visit (INDEPENDENT_AMBULATORY_CARE_PROVIDER_SITE_OTHER): Payer: BC Managed Care – PPO

## 2021-09-17 DIAGNOSIS — D649 Anemia, unspecified: Secondary | ICD-10-CM | POA: Diagnosis not present

## 2021-09-17 LAB — BASIC METABOLIC PANEL
BUN: 18 mg/dL (ref 6–23)
CO2: 30 mEq/L (ref 19–32)
Calcium: 9.2 mg/dL (ref 8.4–10.5)
Chloride: 100 mEq/L (ref 96–112)
Creatinine, Ser: 1.6 mg/dL — ABNORMAL HIGH (ref 0.40–1.50)
GFR: 48.85 mL/min — ABNORMAL LOW (ref >60.00)
Glucose, Bld: 77 mg/dL (ref 70–99)
Potassium: 3.1 mEq/L — ABNORMAL LOW (ref 3.5–5.1)
Sodium: 139 mEq/L (ref 135–145)

## 2021-09-18 LAB — VITAMIN B12: Vitamin B-12: 125 pg/mL — ABNORMAL LOW (ref 211–911)

## 2021-09-18 LAB — IBC + FERRITIN
Ferritin: 5.6 ng/mL — ABNORMAL LOW (ref 22.0–322.0)
Iron: 48 ug/dL (ref 42–165)
Saturation Ratios: 8.5 % — ABNORMAL LOW (ref 20.0–50.0)
TIBC: 564.2 ug/dL — ABNORMAL HIGH (ref 250.0–450.0)
Transferrin: 403 mg/dL — ABNORMAL HIGH (ref 212.0–360.0)

## 2021-09-18 LAB — FOLATE: Folate: 12.5 ng/mL (ref >5.9)

## 2021-09-19 ENCOUNTER — Other Ambulatory Visit: Payer: Self-pay | Admitting: Adult Health

## 2021-09-19 ENCOUNTER — Other Ambulatory Visit: Payer: Self-pay

## 2021-09-19 DIAGNOSIS — N289 Disorder of kidney and ureter, unspecified: Secondary | ICD-10-CM

## 2021-09-19 MED ORDER — BISOPROLOL FUMARATE 10 MG PO TABS: 20.0000 mg | ORAL_TABLET | Freq: Every day | ORAL | 3 refills | Status: DC

## 2021-10-17 ENCOUNTER — Other Ambulatory Visit: Payer: Self-pay | Admitting: Adult Health

## 2021-10-17 DIAGNOSIS — F339 Major depressive disorder, recurrent, unspecified: Secondary | ICD-10-CM

## 2021-11-22 DIAGNOSIS — E1122 Type 2 diabetes mellitus with diabetic chronic kidney disease: Secondary | ICD-10-CM | POA: Diagnosis not present

## 2021-11-22 DIAGNOSIS — N1831 Chronic kidney disease, stage 3a: Secondary | ICD-10-CM | POA: Diagnosis not present

## 2021-11-22 DIAGNOSIS — N189 Chronic kidney disease, unspecified: Secondary | ICD-10-CM | POA: Diagnosis not present

## 2021-11-22 DIAGNOSIS — I129 Hypertensive chronic kidney disease with stage 1 through stage 4 chronic kidney disease, or unspecified chronic kidney disease: Secondary | ICD-10-CM | POA: Diagnosis not present

## 2021-11-23 ENCOUNTER — Other Ambulatory Visit: Payer: Self-pay | Admitting: Nephrology

## 2021-11-23 DIAGNOSIS — N1831 Chronic kidney disease, stage 3a: Secondary | ICD-10-CM

## 2021-12-07 DIAGNOSIS — D649 Anemia, unspecified: Secondary | ICD-10-CM | POA: Diagnosis not present

## 2021-12-11 ENCOUNTER — Ambulatory Visit
Admission: RE | Admit: 2021-12-11 | Discharge: 2021-12-11 | Disposition: A | Discharge status: Home / Self Care | Payer: BC Managed Care – PPO | Source: Ambulatory Visit | Attending: Nephrology | Admitting: Nephrology

## 2021-12-11 DIAGNOSIS — N1831 Chronic kidney disease, stage 3a: Secondary | ICD-10-CM

## 2021-12-31 ENCOUNTER — Other Ambulatory Visit: Payer: Self-pay | Admitting: Internal Medicine

## 2022-01-03 ENCOUNTER — Other Ambulatory Visit: Payer: Self-pay | Admitting: Internal Medicine

## 2022-01-11 ENCOUNTER — Ambulatory Visit: Payer: BC Managed Care – PPO | Admitting: Internal Medicine

## 2022-01-15 ENCOUNTER — Other Ambulatory Visit: Payer: Self-pay | Admitting: Adult Health

## 2022-01-25 ENCOUNTER — Other Ambulatory Visit: Payer: Self-pay | Admitting: Adult Health

## 2022-01-30 ENCOUNTER — Other Ambulatory Visit: Payer: Self-pay | Admitting: Adult Health

## 2022-01-30 DIAGNOSIS — I10 Essential (primary) hypertension: Secondary | ICD-10-CM

## 2022-02-27 ENCOUNTER — Other Ambulatory Visit: Payer: Self-pay | Admitting: Adult Health

## 2022-03-04 ENCOUNTER — Encounter: Payer: Self-pay | Admitting: Adult Health

## 2022-03-06 NOTE — Telephone Encounter (Signed)
Please advise 

## 2022-03-14 ENCOUNTER — Encounter: Payer: Self-pay | Admitting: Internal Medicine

## 2022-03-14 ENCOUNTER — Ambulatory Visit (INDEPENDENT_AMBULATORY_CARE_PROVIDER_SITE_OTHER): Payer: BC Managed Care – PPO | Admitting: Internal Medicine

## 2022-03-14 VITALS — BP 128/78 | HR 68 | Ht 77.0 in | Wt 230.8 lb

## 2022-03-14 DIAGNOSIS — E781 Pure hyperglyceridemia: Secondary | ICD-10-CM

## 2022-03-14 DIAGNOSIS — Z794 Long term (current) use of insulin: Secondary | ICD-10-CM | POA: Diagnosis not present

## 2022-03-14 DIAGNOSIS — E661 Drug-induced obesity: Secondary | ICD-10-CM

## 2022-03-14 DIAGNOSIS — E1165 Type 2 diabetes mellitus with hyperglycemia: Secondary | ICD-10-CM | POA: Diagnosis not present

## 2022-03-14 DIAGNOSIS — Z6832 Body mass index (BMI) 32.0-32.9, adult: Secondary | ICD-10-CM

## 2022-03-14 DIAGNOSIS — E66811 Obesity, class 1: Secondary | ICD-10-CM

## 2022-03-14 LAB — POCT GLYCOSYLATED HEMOGLOBIN (HGB A1C): Hemoglobin A1C: 4.9 % (ref 4.0–5.6)

## 2022-03-14 NOTE — Patient Instructions (Addendum)
Please continue: - Trulicity 2.99 mg weekly  Please reduce: - Lantus 10-12 units in a.m.  Please return in 6 months with your sugar log.

## 2022-03-14 NOTE — Progress Notes (Addendum)
Patient ID: Keith Leblanc, male   DOB: 1966/06/23, 56 y.o.   MRN: [id number]   HPI: Keith Leblanc is a 56 y.o.-year-old male, returning for f/u for  DM2, dx in 06/2016 (prev. Prediabetes since 2015-16), insulin-dependent, uncontrolled, with complications (CKD). Last visit 8 months ago.  Interim history: He continues to drink alcohol  - not able to stop >> started to see another counselor since his previous counselor was out of network. No GI symptoms.  He has increased urination at night. He lost weight since last visit: 15 more pounds.  No weakness or fatigue.  Reviewed history: Patient was diagnosed with diabetes in 07/07/2016, when he presented to the hospital in DKA. An A1c was 12%. His CBG was 1081. Retrospectively, he had increased urination, increased thirst, blurred vision, fatigue, rapid weight loss of 25 lbs. He had a lot of stress at work + drinking A LOT of sweet drinks (4 cans of soda a day + gatorade + juice). He then came off off these but still working on decreasing Alcohol - 4-5 beers a day.  He was started on insulin inhouse, and his CBG is greatly improved afterwards. Less than a month later, his HbA1c decreased to 9.6%.  Reviewed HbA1c levels: Lab Results  Component Value Date   HGBA1C 4.8 07/10/2021   HGBA1C 5.1 11/30/2020   HGBA1C 5.3 03/13/2020   Pt was on a regimen of: - Metformin 500 mg 0-1x a day with dinner >> could not tolerate this 2/2 nausea - Basaglar 40 units at bedtime - takes 2-3x a week - Novolog 6 units 3x a day, before meals >> stopped 29/7989  Now on: - Trulicity 2.11 >> 1.5 >> 0.75 mg weekly - Lantus 30 >> 24 >> 20 units daily in am  Pt checks his sugars once a day: - am:   89, 130s, 140 (11 am-12 pm) >> 85-110 >> 69, 90-105 - 2h after b'fast: 107-122 >> n/c >> 176 >> n/c - before lunch: 130-140 >> 78, 120-134 >> n/c - 2h after lunch: n/c >> 165 - before dinner: n/c >> 95-110 >> n/c >> 173 >> n/c - 2h after dinner: 120s >> n/c >> 30  min-1h: 115-130 - bedtime: n/c >> 89-100 >> n/c >> 165 - nighttime: n/c Lowest sugar was 89 >> 78 >> 69; he has hypoglycemia awareness at 80s Highest sugar was 400s ... >> 176 >> 165. He was in the ED 05/2018 for hyperglycemia (CBG 400s >> ED: 361).   Glucometer: One Touch Ultra  Pt's meals are: - Breakfast: 2-3 boiled eggs, sausage, cheese, diet tea - Lunch: salad, chicken or pork, coke zero - Dinner: sea food + veggies + brown rice, coke zero - Snacks: water  -+ history of CKD: 11/22/2021: 22/2.28, GFR 33, ACR 11, glucose 95 Lab Results  Component Value Date   BUN 18 09/17/2021   BUN 16 09/12/2021   CREATININE 1.60 (H) 09/17/2021   CREATININE 1.75 (H) 09/12/2021   -+ HL: Lab Results  Component Value Date   CHOL 165 09/12/2021   HDL 66.30 09/12/2021   LDLCALC 143 (H) 08/27/2016   LDLDIRECT 72.0 09/12/2021   TRIG 220.0 (H) 09/12/2021   CHOLHDL 2 09/12/2021  On Fenofibrate.  - last eye exam was in 06/2020: No DR reportedly.   -No numbness and tingling in his feet.  On White Ginseng, Ashwagandha, burdock root.  ROS: + see HPI + Weight loss  I reviewed pt's medications, allergies, PMH, social hx, family hx, and changes  were documented in the history of present illness. Otherwise, unchanged from my initial visit note.  Past Medical History:  Diagnosis Date   Blood in stool    bright red blood    Chicken pox    Diabetes mellitus ([place])    Erectile dysfunction    Gout    Hypertension    Past Surgical History:  Procedure Laterality Date   APPENDECTOMY  2004   COLONOSCOPY     TOOTH EXTRACTION  07/06/2020   Social History   Social History   Marital status: Single    Spouse name: N/A   Number of children: 1 - 54 y/o in 2018   Occupational History   Heritage manager    Social History Main Topics   Smoking status: Former Smoker   Smokeless tobacco: Started to chew tobacco    Alcohol use      Comment: 4-5 beers a day    Drug use: Yes    Types:  Marijuana   Sexual activity: Yes   Social History Narrative   Works for [employer]    Not married    One daughter who does not live with him    Likes to gamble, walk in the parks, travel.    Current Outpatient Medications on File Prior to Visit  Medication Sig Dispense Refill   allopurinol (ZYLOPRIM) 300 MG tablet TAKE 1 TABLET BY MOUTH  DAILY 90 tablet 3   amLODipine (NORVASC) 10 MG tablet TAKE 1 TABLET BY MOUTH EVERY DAY 90 tablet 0   bisoprolol (ZEBETA) 10 MG tablet TAKE 2 TABLETS BY MOUTH DAILY 180 tablet 3   blood glucose meter kit and supplies Dispense based on patient and insurance preference. Use up to four times daily as directed. (FOR ICD-9 250.00, 250.01). 1 each 0   buPROPion (WELLBUTRIN XL) 300 MG 24 hr tablet TAKE 1 TABLET BY MOUTH ONCE DAILY 90 tablet 3   colchicine 0.6 MG tablet TAKE 1 TABLET BY MOUTH  DAILY. WHEN HAVING FLARES  TAKE 1 TABLET BY MOUTH  TWICE DAILY 180 tablet 0   Dulaglutide (TRULICITY) 8.41 LK/4.4WN SOPN Inject 0.75 mg in am weekly under skin 2 mL 5   fenofibrate (TRICOR) 145 MG tablet Take 1 tablet (145 mg total) by mouth daily. 90 tablet 1   insulin glargine (LANTUS SOLOSTAR) 100 UNIT/ML Solostar Pen Inject 20 units under skin daily 15 mL 5   Insulin Pen Needle (B-D UF III MINI PEN NEEDLES) 31G X 5 MM MISC USE 4 TIMES DAILY AFTER MEALS AND AT BEDTIME 100 each 11   Lancets (ONETOUCH ULTRASOFT) lancets Test blood sugars as directed. Dx E11.9 100 each 12   omeprazole (PRILOSEC) 40 MG capsule Take 1 capsule (40 mg total) by mouth daily. 90 capsule 3   ONE TOUCH ULTRA TEST test strip USE UP TO 4 TIMES DAILY AS DIRECTED 100 each 3   sildenafil (REVATIO) 20 MG tablet TAKE 2-5 TABLETS BY MOUTH AS NEEDED FOR SEXUAL ACTIVITY 50 tablet 2   No current facility-administered medications on file prior to visit.   No Known Allergies Family History  Problem Relation Age of Onset   Alcohol abuse Father    Hypertension Father    Heart disease Father    Gout Father     Breast cancer Mother    Hypertension Mother    Gout Mother    Healthy Sister    Healthy Daughter    Colon cancer Neg Hx    Esophageal cancer Neg Hx  Rectal cancer Neg Hx    Stomach cancer Neg Hx   Pt has FH of DM in mother.  PE: BP 128/78 (BP Location: Right Arm, Patient Position: Sitting, Cuff Size: Normal)   Pulse 68   Ht _0  (1.956 m)   Wt 230 lb 12.8 oz (104.7 kg)   SpO2 97%   BMI 27.37 kg/m  Wt Readings from Last 3 Encounters:  03/14/22 230 lb 12.8 oz (104.7 kg)  09/12/21 243 lb (110.2 kg)  07/10/21 240 lb 9.6 oz (109.1 kg)   Constitutional: overweight, in NAD Eyes:  EOMI, no exophthalmos ENT: no neck masses, no cervical lymphadenopathy Cardiovascular: RRR, No MRG Respiratory: CTA B Musculoskeletal: no deformities Skin:no rashes Neurological: + tremor with outstretched hands He declines a foot exam today.  ASSESSMENT: 1. DM2, -insulin-dependent, now more controlled, with complications - CKD  He has type 2, rather than type 1 diabetes: Component     Latest Ref Rng & Units 08/27/2016  Glucose     70 - 99 mg/dL 118 (H)  Pancreatic Islet Cell Antibody     <5 JDF Units <5  Glutamic Acid Decarb Ab     <5 IU/mL <5  C-Peptide     0.80 - 3.85 ng/mL 1.45   2. Obesity class 1 BMI Classification: < 18.5 underweight  18.5-24.9 normal weight  25.0-29.9 overweight  30.0-34.9 class I obesity  35.0-39.9 class II obesity  ? 40.0 class III obesity   3.  Hypertriglyceridemia  PLAN:  1. Patient with history of uncontrolled diabetes, on basal insulin and weekly GLP-1 receptor agonist, with significantly improved diabetes DXA after he started to improve his diet and reduce alcohol in 2019.  HbA1c was 4.8% at last visit, 8 months ago, and at that time we reduced his insulin and Trulicity doses.  At that time he was telling me that he had constipation and nausea and also some stomach discomfort.  We again discussed about the importance of stopping alcohol. -Since last  visit, his kidney function was worse when checked in 11/22/2021 by nephrology.  Also, at that time he was anemic, with a hemoglobin of 8.8 and a ferritin of 8. -At today's visit, blood sugars are at goal but HbA1c is lower than expected from his log.  This is possibly related to his anemia.  I will check a fructosamine level today.  I discussed with the patient about the difference between the 2 measurements.  In the meantime, since his sugars are low the target range and he does describe a blood sugar in the 60s in the running after being more active the day before, we discussed about reducing Lantus further.  I advised him to let me know if he has any more lows or if sugars  stay close to 70s, in which case we will reduce the Lantus more.  He did have investigation for insulin deficiency in 2018 and we may need to repeat this before stopping his insulin completely. -Unfortunately, he was not able to stop drinking, which -I advised him to: Patient Instructions  Please continue: - Trulicity 0.27 mg weekly  Please reduce: - Lantus 10-12 units in a.m.  Please return in 6 months with your sugar log.   - we checked his HbA1c: 4.9% (stable, low) - will check a fructosamine - advised to check sugars at different times of the day - 1x a day, rotating check times - advised for yearly eye exams >> he is not UTD but has an appointment scheduled -  he declines a foot exam today - return to clinic in 6 months  2. Obesity class 1 -We we will continue the GLP-1 receptor agonist, which should also help with weight loss -He lost 45 pounds before the last 2 visits combined -He lost 13 more pounds since last visit.  3. HTG -We discussed in the past and again today about reducing/stopping alcohol -Reviewed the latest lipid panel from 6 months ago: LDL at goal, triglycerides elevated: Lab Results  Component Value Date   CHOL 165 09/12/2021   HDL 66.30 09/12/2021   LDLCALC 143 (H) 08/27/2016   LDLDIRECT 72.0  09/12/2021   TRIG 220.0 (H) 09/12/2021   CHOLHDL 2 09/12/2021  -On fenofibrate 145 mg daily.  He tolerates this well.  Component     Latest Ref Rng 03/14/2022  Hemoglobin A1C     4.0 - 5.6 % 4.9   Fructosamine     0 - 285 umol/L 216    HbA1c calculated from fructosamine is only slightly higher, at 5.3%.  Philemon Kingdom, MD PhD [facility]

## 2022-03-16 LAB — FRUCTOSAMINE: Fructosamine: 216 umol/L (ref 0–285)

## 2022-03-21 NOTE — Telephone Encounter (Signed)
PPW has been faxed

## 2022-03-30 ENCOUNTER — Other Ambulatory Visit: Payer: Self-pay | Admitting: Adult Health

## 2022-04-02 ENCOUNTER — Encounter: Payer: Self-pay | Admitting: Nephrology

## 2022-04-12 ENCOUNTER — Other Ambulatory Visit: Payer: Self-pay | Admitting: Adult Health

## 2022-04-12 DIAGNOSIS — M1A49X0 Other secondary chronic gout, multiple sites, without tophus (tophi): Secondary | ICD-10-CM

## 2022-04-30 ENCOUNTER — Other Ambulatory Visit: Payer: Self-pay | Admitting: Adult Health

## 2022-05-23 ENCOUNTER — Other Ambulatory Visit: Payer: Self-pay | Admitting: Adult Health

## 2022-05-23 DIAGNOSIS — I10 Essential (primary) hypertension: Secondary | ICD-10-CM

## 2022-07-04 ENCOUNTER — Other Ambulatory Visit: Payer: Self-pay | Admitting: Internal Medicine

## 2022-07-04 DIAGNOSIS — E119 Type 2 diabetes mellitus without complications: Secondary | ICD-10-CM | POA: Diagnosis not present

## 2022-07-04 DIAGNOSIS — Z794 Long term (current) use of insulin: Secondary | ICD-10-CM

## 2022-07-04 DIAGNOSIS — H5213 Myopia, bilateral: Secondary | ICD-10-CM | POA: Diagnosis not present

## 2022-07-04 LAB — HM DIABETES EYE EXAM

## 2022-07-08 ENCOUNTER — Encounter: Payer: Self-pay | Admitting: Internal Medicine

## 2022-07-30 ENCOUNTER — Other Ambulatory Visit: Payer: Self-pay | Admitting: Internal Medicine

## 2022-07-30 DIAGNOSIS — E1165 Type 2 diabetes mellitus with hyperglycemia: Secondary | ICD-10-CM

## 2022-08-22 ENCOUNTER — Other Ambulatory Visit: Payer: Self-pay | Admitting: Adult Health

## 2022-08-22 DIAGNOSIS — F339 Major depressive disorder, recurrent, unspecified: Secondary | ICD-10-CM

## 2022-08-23 ENCOUNTER — Other Ambulatory Visit: Payer: Self-pay | Admitting: Adult Health

## 2022-08-23 DIAGNOSIS — N529 Male erectile dysfunction, unspecified: Secondary | ICD-10-CM

## 2022-08-23 NOTE — Telephone Encounter (Signed)
Pt needs a CPE for further refills. Rx refilled for 30 days.

## 2022-09-17 ENCOUNTER — Ambulatory Visit: Payer: BC Managed Care – PPO | Admitting: Internal Medicine

## 2022-09-30 ENCOUNTER — Ambulatory Visit: Payer: BC Managed Care – PPO | Admitting: Internal Medicine

## 2022-10-13 ENCOUNTER — Other Ambulatory Visit: Payer: Self-pay | Admitting: Adult Health

## 2022-10-13 DIAGNOSIS — F339 Major depressive disorder, recurrent, unspecified: Secondary | ICD-10-CM

## 2022-10-15 NOTE — Telephone Encounter (Signed)
Patient need to schedule an CPE for more refills. 

## 2022-11-05 ENCOUNTER — Other Ambulatory Visit: Payer: Self-pay | Admitting: Internal Medicine

## 2022-11-05 DIAGNOSIS — E1165 Type 2 diabetes mellitus with hyperglycemia: Secondary | ICD-10-CM

## 2022-11-08 ENCOUNTER — Inpatient Hospital Stay (HOSPITAL_COMMUNITY)
Admission: EM | Admit: 2022-11-08 | Discharge: 2022-11-10 | DRG: 062 | Disposition: A | Discharge status: Home / Self Care | Payer: BC Managed Care – PPO | Attending: Neurology | Admitting: Neurology

## 2022-11-08 ENCOUNTER — Emergency Department (HOSPITAL_COMMUNITY): Payer: BC Managed Care – PPO

## 2022-11-08 ENCOUNTER — Encounter (HOSPITAL_COMMUNITY): Payer: Self-pay | Admitting: Emergency Medicine

## 2022-11-08 ENCOUNTER — Other Ambulatory Visit: Payer: Self-pay

## 2022-11-08 DIAGNOSIS — K219 Gastro-esophageal reflux disease without esophagitis: Secondary | ICD-10-CM | POA: Diagnosis present

## 2022-11-08 DIAGNOSIS — Z811 Family history of alcohol abuse and dependence: Secondary | ICD-10-CM

## 2022-11-08 DIAGNOSIS — Z79899 Other long term (current) drug therapy: Secondary | ICD-10-CM

## 2022-11-08 DIAGNOSIS — I493 Ventricular premature depolarization: Secondary | ICD-10-CM | POA: Diagnosis not present

## 2022-11-08 DIAGNOSIS — I491 Atrial premature depolarization: Secondary | ICD-10-CM | POA: Diagnosis present

## 2022-11-08 DIAGNOSIS — R42 Dizziness and giddiness: Secondary | ICD-10-CM | POA: Diagnosis not present

## 2022-11-08 DIAGNOSIS — R29818 Other symptoms and signs involving the nervous system: Secondary | ICD-10-CM | POA: Diagnosis not present

## 2022-11-08 DIAGNOSIS — F32A Depression, unspecified: Secondary | ICD-10-CM | POA: Diagnosis present

## 2022-11-08 DIAGNOSIS — R29810 Facial weakness: Secondary | ICD-10-CM | POA: Diagnosis present

## 2022-11-08 DIAGNOSIS — F1010 Alcohol abuse, uncomplicated: Secondary | ICD-10-CM | POA: Diagnosis not present

## 2022-11-08 DIAGNOSIS — M109 Gout, unspecified: Secondary | ICD-10-CM | POA: Diagnosis not present

## 2022-11-08 DIAGNOSIS — E876 Hypokalemia: Secondary | ICD-10-CM | POA: Diagnosis not present

## 2022-11-08 DIAGNOSIS — Z794 Long term (current) use of insulin: Secondary | ICD-10-CM

## 2022-11-08 DIAGNOSIS — I10 Essential (primary) hypertension: Secondary | ICD-10-CM | POA: Diagnosis not present

## 2022-11-08 DIAGNOSIS — I4891 Unspecified atrial fibrillation: Secondary | ICD-10-CM | POA: Diagnosis not present

## 2022-11-08 DIAGNOSIS — E1122 Type 2 diabetes mellitus with diabetic chronic kidney disease: Secondary | ICD-10-CM | POA: Diagnosis present

## 2022-11-08 DIAGNOSIS — R200 Anesthesia of skin: Secondary | ICD-10-CM | POA: Diagnosis present

## 2022-11-08 DIAGNOSIS — D631 Anemia in chronic kidney disease: Secondary | ICD-10-CM | POA: Diagnosis present

## 2022-11-08 DIAGNOSIS — Z7985 Long-term (current) use of injectable non-insulin antidiabetic drugs: Secondary | ICD-10-CM | POA: Diagnosis not present

## 2022-11-08 DIAGNOSIS — I63511 Cerebral infarction due to unspecified occlusion or stenosis of right middle cerebral artery: Secondary | ICD-10-CM | POA: Diagnosis not present

## 2022-11-08 DIAGNOSIS — R29702 NIHSS score 2: Secondary | ICD-10-CM | POA: Diagnosis present

## 2022-11-08 DIAGNOSIS — I639 Cerebral infarction, unspecified: Secondary | ICD-10-CM | POA: Diagnosis present

## 2022-11-08 DIAGNOSIS — I440 Atrioventricular block, first degree: Secondary | ICD-10-CM | POA: Diagnosis present

## 2022-11-08 DIAGNOSIS — R531 Weakness: Secondary | ICD-10-CM | POA: Diagnosis not present

## 2022-11-08 DIAGNOSIS — G459 Transient cerebral ischemic attack, unspecified: Secondary | ICD-10-CM | POA: Diagnosis not present

## 2022-11-08 DIAGNOSIS — F419 Anxiety disorder, unspecified: Secondary | ICD-10-CM | POA: Diagnosis not present

## 2022-11-08 DIAGNOSIS — I129 Hypertensive chronic kidney disease with stage 1 through stage 4 chronic kidney disease, or unspecified chronic kidney disease: Secondary | ICD-10-CM | POA: Diagnosis not present

## 2022-11-08 DIAGNOSIS — E785 Hyperlipidemia, unspecified: Secondary | ICD-10-CM | POA: Diagnosis present

## 2022-11-08 DIAGNOSIS — R4781 Slurred speech: Secondary | ICD-10-CM | POA: Diagnosis present

## 2022-11-08 DIAGNOSIS — I6389 Other cerebral infarction: Secondary | ICD-10-CM | POA: Diagnosis not present

## 2022-11-08 DIAGNOSIS — G8194 Hemiplegia, unspecified affecting left nondominant side: Secondary | ICD-10-CM | POA: Diagnosis not present

## 2022-11-08 DIAGNOSIS — Z803 Family history of malignant neoplasm of breast: Secondary | ICD-10-CM

## 2022-11-08 DIAGNOSIS — Z8249 Family history of ischemic heart disease and other diseases of the circulatory system: Secondary | ICD-10-CM

## 2022-11-08 DIAGNOSIS — I6602 Occlusion and stenosis of left middle cerebral artery: Secondary | ICD-10-CM | POA: Diagnosis not present

## 2022-11-08 DIAGNOSIS — N182 Chronic kidney disease, stage 2 (mild): Secondary | ICD-10-CM | POA: Diagnosis present

## 2022-11-08 DIAGNOSIS — Z87891 Personal history of nicotine dependence: Secondary | ICD-10-CM

## 2022-11-08 LAB — COMPREHENSIVE METABOLIC PANEL
ALT: 18 U/L (ref 0–44)
AST: 21 U/L (ref 15–41)
Albumin: 3.6 g/dL (ref 3.5–5.0)
Alkaline Phosphatase: 33 U/L — ABNORMAL LOW (ref 38–126)
Anion gap: 10 (ref 5–15)
BUN: 18 mg/dL (ref 6–20)
CO2: 24 mmol/L (ref 22–32)
Calcium: 8.8 mg/dL — ABNORMAL LOW (ref 8.9–10.3)
Chloride: 106 mmol/L (ref 98–111)
Creatinine, Ser: 1.66 mg/dL — ABNORMAL HIGH (ref 0.61–1.24)
GFR, Estimated: 49 mL/min — ABNORMAL LOW (ref >60)
Glucose, Bld: 89 mg/dL (ref 70–99)
Potassium: 3.3 mmol/L — ABNORMAL LOW (ref 3.5–5.1)
Sodium: 140 mmol/L (ref 135–145)
Total Bilirubin: 0.7 mg/dL (ref 0.3–1.2)
Total Protein: 6.6 g/dL (ref 6.5–8.1)

## 2022-11-08 LAB — DIFFERENTIAL
Abs Immature Granulocytes: 0.02 10*3/uL (ref 0.00–0.07)
Basophils Absolute: 0.1 10*3/uL (ref 0.0–0.1)
Basophils Relative: 1 %
Eosinophils Absolute: 0.1 10*3/uL (ref 0.0–0.5)
Eosinophils Relative: 2 %
Immature Granulocytes: 0 %
Lymphocytes Relative: 45 %
Lymphs Abs: 3 10*3/uL (ref 0.7–4.0)
Monocytes Absolute: 0.6 10*3/uL (ref 0.1–1.0)
Monocytes Relative: 9 %
Neutro Abs: 2.8 10*3/uL (ref 1.7–7.7)
Neutrophils Relative %: 43 %

## 2022-11-08 LAB — I-STAT CHEM 8, ED
BUN: 19 mg/dL (ref 6–20)
Calcium, Ion: 1.12 mmol/L — ABNORMAL LOW (ref 1.15–1.40)
Chloride: 105 mmol/L (ref 98–111)
Creatinine, Ser: 1.8 mg/dL — ABNORMAL HIGH (ref 0.61–1.24)
Glucose, Bld: 86 mg/dL (ref 70–99)
HCT: 30 % — ABNORMAL LOW (ref 39.0–52.0)
Hemoglobin: 10.2 g/dL — ABNORMAL LOW (ref 13.0–17.0)
Potassium: 3.3 mmol/L — ABNORMAL LOW (ref 3.5–5.1)
Sodium: 141 mmol/L (ref 135–145)
TCO2: 24 mmol/L (ref 22–32)

## 2022-11-08 LAB — CBC
HCT: 28.8 % — ABNORMAL LOW (ref 39.0–52.0)
Hemoglobin: 8.3 g/dL — ABNORMAL LOW (ref 13.0–17.0)
MCH: 22.9 pg — ABNORMAL LOW (ref 26.0–34.0)
MCHC: 28.8 g/dL — ABNORMAL LOW (ref 30.0–36.0)
MCV: 79.6 fL — ABNORMAL LOW (ref 80.0–100.0)
Platelets: 350 10*3/uL (ref 150–400)
RBC: 3.62 MIL/uL — ABNORMAL LOW (ref 4.22–5.81)
RDW: 16.3 % — ABNORMAL HIGH (ref 11.5–15.5)
WBC: 6.6 10*3/uL (ref 4.0–10.5)
nRBC: 0 % (ref 0.0–0.2)

## 2022-11-08 LAB — ETHANOL: Alcohol, Ethyl (B): <10 mg/dL (ref <10)

## 2022-11-08 LAB — PROTIME-INR
INR: 1.1 (ref 0.8–1.2)
Prothrombin Time: 14.6 seconds (ref 11.4–15.2)

## 2022-11-08 LAB — APTT: aPTT: 25 seconds (ref 24–36)

## 2022-11-08 MED ORDER — ASPIRIN 81 MG PO CHEW
650.0000 mg | CHEWABLE_TABLET | Freq: Once | ORAL | Status: AC
  Administered 2022-11-08: 648 mg via ORAL
  Filled 2022-11-08: qty 9

## 2022-11-08 MED ORDER — SODIUM CHLORIDE 0.9% FLUSH
3.0000 mL | Freq: Once | INTRAVENOUS | Status: AC
  Administered 2022-11-08: 3 mL via INTRAVENOUS

## 2022-11-08 NOTE — ED Notes (Signed)
Dr. Rees at bedside  

## 2022-11-08 NOTE — ED Provider Notes (Incomplete)
EMERGENCY DEPARTMENT AT [facility] Provider Note   CSN: [id number] Arrival date & time: 11/08/22  2246     History {Add pertinent medical, surgical, social history, OB history to HPI:1} No chief complaint on file.   Clell Schwaderer is a 57 y.o. male.  56 year old male brought in by EMS with concern for stroke.  Past medical history of hypertension, diabetes.  Patient last known well at 9:48 PM tonight when [place] at the airport [place] up, noted that his speech was slurred and he was having difficulty opening the car door with his left hand.  Patient and girlfriend initially thought that he was having hypoglycemic event, called EMS for diabetic emergency.  EMS arrived on scene, blood sugar was found to be 84, he was provided with food and blood sugar improved to 119 however his symptoms of slurred speech, left arm weakness and feeling like his arm was "not my own" did not improve.  EMS notes no facial droop, does have difficulty walking with left leg weakness.  On arrival in the ER, symptoms seem to be improving somewhat.  Patient arrives activated as code stroke, is met in the department by the neurology team. At 1 point and told EMS that he was dizzy, no longer feeling this way, denies headache.  He is not having any difficulty breathing or chest pain.       Home Medications Prior to Admission medications   Medication Sig Start Date End Date Taking? Authorizing Provider  allopurinol (ZYLOPRIM) 300 MG tablet TAKE 1 TABLET BY MOUTH DAILY 04/16/22   Nafziger, Kandee Keen, NP  amLODipine (NORVASC) 10 MG tablet TAKE 1 TABLET BY MOUTH DAILY 05/23/22   Nafziger, Kandee Keen, NP  bisoprolol (ZEBETA) 10 MG tablet TAKE 2 TABLETS BY MOUTH DAILY 01/16/22   Shirline Frees, NP  blood glucose meter kit and supplies Dispense based on patient and insurance preference. Use up to four times daily as directed. (FOR ICD-9 250.00, 250.01). 07/09/16   Rodolph Bong, MD   buPROPion (WELLBUTRIN XL) 300 MG 24 hr tablet TAKE 1 TABLET BY MOUTH ONCE  DAILY 10/15/22   Nafziger, Kandee Keen, NP  colchicine 0.6 MG tablet TAKE 1 TABLET BY MOUTH  DAILY. WHEN HAVING FLARES  TAKE 1 TABLET BY MOUTH  TWICE DAILY 11/23/20   Pollyann Savoy, MD  Dulaglutide (TRULICITY) 0.75 MG/0.5ML SOPN Inject 0.75 mg in am weekly under skin 07/10/21   Carlus Pavlov, MD  Dulaglutide (TRULICITY) 0.75 MG/0.5ML SOPN Inject 0.75 mg into the skin once a week. 07/04/22   Carlus Pavlov, MD  fenofibrate (TRICOR) 145 MG tablet Take 1 tablet (145 mg total) by mouth daily. 09/13/21   Nafziger, Kandee Keen, NP  Insulin Pen Needle (B-D UF III MINI PEN NEEDLES) 31G X 5 MM MISC USE 4 TIMES DAILY AFTER MEALS AND AT BEDTIME 02/24/20   Carlus Pavlov, MD  Lancets [facility] ULTRASOFT) lancets Test blood sugars as directed. Dx E11.9 08/06/16   Shirline Frees, NP  LANTUS SOLOSTAR 100 UNIT/ML Solostar Pen INJECT SUBCUTANEOUSLY 10 TO 12  UNITS DAILY 07/30/22   Carlus Pavlov, MD  omeprazole (PRILOSEC) 40 MG capsule Take 1 capsule (40 mg total) by mouth daily. 01/28/22   Nafziger, Kandee Keen, NP  ONE TOUCH ULTRA TEST test strip USE UP TO 4 TIMES DAILY AS DIRECTED 08/30/16   Nelwyn Salisbury, MD  sildenafil (REVATIO) 20 MG tablet TAKE 2-5 TABLETS BY MOUTH AS NEEDED FOR SEXUAL ACTIVITY 08/23/22   Shirline Frees, NP  Allergies    Patient has no known allergies.    Review of Systems   Review of Systems Negative except as per HPI Physical Exam Updated Vital Signs Wt 117.2 kg   BMI 30.64 kg/m  Physical Exam Vitals and nursing note reviewed.  Constitutional:      General: He is not in acute distress.    Appearance: He is well-developed. He is not diaphoretic.  HENT:     Head: Normocephalic and atraumatic.  Pulmonary:     Effort: Pulmonary effort is normal.  Neurological:     Mental Status: He is alert and oriented to person, place, and time.  Psychiatric:        Behavior: Behavior normal.     ED Results / Procedures /  Treatments   Labs (all labs ordered are listed, but only abnormal results are displayed) Labs Reviewed  CBC - Abnormal; Notable for the following components:      Result Value   RBC 3.62 (*)    Hemoglobin 8.3 (*)    HCT 28.8 (*)    MCV 79.6 (*)    MCH 22.9 (*)    MCHC 28.8 (*)    RDW 16.3 (*)    All other components within normal limits  I-STAT CHEM 8, ED - Abnormal; Notable for the following components:   Potassium 3.3 (*)    Creatinine, Ser 1.80 (*)    Calcium, Ion 1.12 (*)    Hemoglobin 10.2 (*)    HCT 30.0 (*)    All other components within normal limits  DIFFERENTIAL  PROTIME-INR  APTT  COMPREHENSIVE METABOLIC PANEL  ETHANOL  CBG MONITORING, ED    EKG None  Radiology No results found.  Procedures Procedures  {Document cardiac monitor, telemetry assessment procedure when appropriate:1}  Medications Ordered in ED Medications  sodium chloride flush (NS) 0.9 % injection 3 mL (has no administration in time range)  aspirin chewable tablet 650 mg (has no administration in time range)    ED Course/ Medical Decision Making/ A&P   {   Click here for ABCD2, HEART and other calculatorsREFRESH Note before signing :1}                          Medical Decision Making  This patient presents to the ED for concern of ***, this involves an extensive number of treatment options, and is a complaint that carries with it a high risk of complications and morbidity.  The differential diagnosis includes ***   Co morbidities that complicate the patient evaluation  ***   Additional history obtained:  Additional history obtained from *** External records from outside source obtained and reviewed including ***   Lab Tests:  I Ordered, and personally interpreted labs.  The pertinent results include:  ***   Imaging Studies ordered:  I ordered imaging studies including ***  I independently visualized and interpreted imaging which showed *** I agree with the radiologist  interpretation   Cardiac Monitoring: / EKG:  The patient was maintained on a cardiac monitor.  I personally viewed and interpreted the cardiac monitored which showed an underlying rhythm of: ***   Consultations Obtained:  I requested consultation with the neurohospitalist, Dr. Otelia Limes,  and discussed lab and imaging findings as well as pertinent plan - they recommend: ASA load at 650mg , hold Plavix, MRI brain and admit to hospitalist.    Problem List / ED Course / Critical interventions / Medication management  *** I  ordered medication including ***  for ***  Reevaluation of the patient after these medicines showed that the patient {resolved/improved/worsened:23923::"improved"} I have reviewed the patients home medicines and have made adjustments as needed   Social Determinants of Health:  ***   Test / Admission - Considered:  ***   {Document critical care time when appropriate:1} {Document review of labs and clinical decision tools ie heart score, Chads2Vasc2 etc:1}  {Document your independent review of radiology images, and any outside records:1} {Document your discussion with family members, caretakers, and with consultants:1} {Document social determinants of health affecting pt's care:1} {Document your decision making why or why not admission, treatments were needed:1} Final Clinical Impression(s) / ED Diagnoses Final diagnoses:  None    Rx / DC Orders ED Discharge Orders     None

## 2022-11-08 NOTE — H&P (Signed)
NEURO HOSPITALIST CONSULT NOTE   Requestig physician: Dr. Madilyn Hook  Reason for Consult: Acute onset of slurred speech and left sided weakness  History obtained from:  EMS, Patient and Chart     HPI:                                                                                                                                          Keith Leblanc is an 57 y.o. male with a PMHx of DM, ED, gout and HTN who presents from home via EMS as a Code Stroke after acute onset of slurred speech and left sided weakness. LKN was 2148. On arrival his weakness was resolved but he still had residual mild speech hesitancy. He also was complaining of left sided numbness and tingling with EMS along with shuffling gait with dragging of his left foot on EMS initial assessment. The patient also complained of his left arm feeling it was "not my own". CBG per EMS was 84. He was given juice and peanut butter with improvement of CBG to 115 but his symptoms persisted. BP was 164/82. A Code Stroke was called in the field. He is not on any blood thinners or antiplatelet medications.   Past Medical History:  Diagnosis Date   Blood in stool    bright red blood    Chicken pox    Diabetes mellitus (HCC)    Erectile dysfunction    Gout    Hypertension     Past Surgical History:  Procedure Laterality Date   APPENDECTOMY  2004   COLONOSCOPY     TOOTH EXTRACTION  07/06/2020    Family History  Problem Relation Age of Onset   Alcohol abuse Father    Hypertension Father    Heart disease Father    Gout Father    Breast cancer Mother    Hypertension Mother    Gout Mother    Healthy Sister    Healthy Daughter    Colon cancer Neg Hx    Esophageal cancer Neg Hx    Rectal cancer Neg Hx    Stomach cancer Neg Hx              Social History:  reports that he has quit smoking. He has never used smokeless tobacco. He reports current alcohol use. He reports that he does not currently use drugs.  No  Known Allergies  MEDICATIONS:  No current facility-administered medications on file prior to encounter.   Current Outpatient Medications on File Prior to Encounter  Medication Sig Dispense Refill   allopurinol (ZYLOPRIM) 300 MG tablet TAKE 1 TABLET BY MOUTH DAILY 90 tablet 3   amLODipine (NORVASC) 10 MG tablet TAKE 1 TABLET BY MOUTH DAILY 90 tablet 3   bisoprolol (ZEBETA) 10 MG tablet TAKE 2 TABLETS BY MOUTH DAILY 180 tablet 3   blood glucose meter kit and supplies Dispense based on patient and insurance preference. Use up to four times daily as directed. (FOR ICD-9 250.00, 250.01). 1 each 0   buPROPion (WELLBUTRIN XL) 300 MG 24 hr tablet TAKE 1 TABLET BY MOUTH ONCE  DAILY 30 tablet 11   colchicine 0.6 MG tablet TAKE 1 TABLET BY MOUTH  DAILY. WHEN HAVING FLARES  TAKE 1 TABLET BY MOUTH  TWICE DAILY 180 tablet 0   Dulaglutide (TRULICITY) 0.75 MG/0.5ML SOPN Inject 0.75 mg in am weekly under skin 2 mL 5   Dulaglutide (TRULICITY) 0.75 MG/0.5ML SOPN Inject 0.75 mg into the skin once a week. 6 mL 1   fenofibrate (TRICOR) 145 MG tablet Take 1 tablet (145 mg total) by mouth daily. 90 tablet 1   Insulin Pen Needle (B-D UF III MINI PEN NEEDLES) 31G X 5 MM MISC USE 4 TIMES DAILY AFTER MEALS AND AT BEDTIME 100 each 11   Lancets (ONETOUCH ULTRASOFT) lancets Test blood sugars as directed. Dx E11.9 100 each 12   LANTUS SOLOSTAR 100 UNIT/ML Solostar Pen INJECT SUBCUTANEOUSLY 10 TO 12  UNITS DAILY 15 mL 3   omeprazole (PRILOSEC) 40 MG capsule Take 1 capsule (40 mg total) by mouth daily. 90 capsule 3   ONE TOUCH ULTRA TEST test strip USE UP TO 4 TIMES DAILY AS DIRECTED 100 each 3   sildenafil (REVATIO) 20 MG tablet TAKE 2-5 TABLETS BY MOUTH AS NEEDED FOR SEXUAL ACTIVITY 50 tablet 2      ROS:                                                                                                                                        No headache, CP, SOB, abdominal pain, vision changes, fever or other infectious symptoms. Other ROS as per HPI.    Blood pressure 137/80, pulse 76, temperature 98.2 F (36.8 C), temperature source Oral, resp. rate (!) 26, weight 117.2 kg, SpO2 99 %.   General Examination:  Physical Exam  HEENT-  [place]/AT    Lungs- Respirations unlabored Extremities- No edema  Neurological Examination Mental Status: Alert, oriented x 5, thought content appropriate.  Speech fluent with intact naming and comprehension. No dysarthria. Able to follow all commands without difficulty. Cranial Nerves: II: Temporal visual fields intact with no extinction to DSS. PERRL  III,IV, VI: No ptosis. EOMI.  V: Temp sensation equal bilaterally  VII: Smile symmetric VIII: Hearing intact to voice IX,X: No hoarseness XI: Symmetric shoulder shrug XII: Midline tongue extension Motor: BUE 5/5 proximally and distally BLE 5/5 proximally and distally  No pronator drift.  Subtly positive on the left with orbiting-fingers test Sensory: Decreased FT sensation to LUE and LLE. Temp sensation intact x 4. There is left sided extinction to DSS.  Deep Tendon Reflexes: 2+ and symmetric throughout Cerebellar: No ataxia with FNF or H-S bilaterally  Gait: Able to stand with own power and walk without assistance, but does list slightly to the left.   NIHSS: 1  Lab Results: Basic Metabolic Panel: Recent Labs  Lab 11/08/22 2251 11/08/22 2252  NA 140 141  K 3.3* 3.3*  CL 106 105  CO2 24  --   GLUCOSE 89 86  BUN 18 19  CREATININE 1.66* 1.80*  CALCIUM 8.8*  --     CBC: Recent Labs  Lab 11/08/22 2251 11/08/22 2252  WBC 6.6  --   NEUTROABS 2.8  --   HGB 8.3* 10.2*  HCT 28.8* 30.0*  MCV 79.6*  --   PLT 350  --     Cardiac Enzymes: No results for input(s): "CKTOTAL", "CKMB", "CKMBINDEX",  "TROPONINI" in the last 168 hours.  Lipid Panel: No results for input(s): "CHOL", "TRIG", "HDL", "CHOLHDL", "VLDL", "LDLCALC" in the last 168 hours.  Imaging: CT HEAD CODE STROKE WO CONTRAST  Result Date: 11/08/2022 CLINICAL DATA:  Code stroke. Initial evaluation for neuro deficit, stroke. EXAM: CT HEAD WITHOUT CONTRAST TECHNIQUE: Contiguous axial images were obtained from the base of the skull through the vertex without intravenous contrast. RADIATION DOSE REDUCTION: This exam was performed according to the departmental dose-optimization program which includes automated exposure control, adjustment of the mA and/or kV according to patient size and/or use of iterative reconstruction technique. COMPARISON:  None Available. FINDINGS: Brain: Cerebral volume within normal limits. No acute intracranial hemorrhage. No acute large vessel territory infarct. No mass lesion, mass effect or midline shift. No hydrocephalus or extra-axial fluid collection. Vascular: No appreciable abnormal hyperdense vessel. Scattered vascular calcifications noted within the carotid siphons. Skull: Scalp soft tissues and calvarium demonstrate no acute finding. Sinuses/Orbits: Globes orbital soft tissues within normal limits. Chronic right frontoethmoidal sinusitis noted. Mild mucosal thickening noted about the ethmoidal air cells as well. Mastoid air cells are clear. Other: None. ASPECTS [facility] Stroke Program Early CT Score) - Ganglionic level infarction (caudate, lentiform nuclei, internal capsule, insula, M1-M3 cortex): 7 - Supraganglionic infarction (M4-M6 cortex): 3 Total score (0-10 with 10 being normal): 10 IMPRESSION: 1. No acute intracranial abnormality. 2. ASPECTS is 10. 3. Chronic right frontoethmoidal sinusitis. These results were communicated to Dr. Otelia Limes at 11:06 pm on 11/08/2022 by text page via the [facility] messaging system. Electronically Signed   By: Rise Mu M.D.   On: 11/08/2022 23:07    Assessment: 57  year old male presenting with acute onset of slurred speech and left sided weakness - Exam reveals left sided sensory deficit. NIHSS 1.  - CT head: No acute intracranial abnormality. ASPECTS is 10. Chronic right frontoethmoidal sinusitis.  -  Overall presentation is most consistent with a small cortical acute ischemic infarction within the right MCA territory versus an acute right thalamic lacunar infarction - Stroke risk factors: DM and HTN - Risks of TNK are felt to outweigh potential benefits due to low NIHSS  Recommendations: - Oral ASA load 650 mg crushed x 1 now if he passes bedside swallow evaluation, otherwise administer 600 mg rectally - Permissive HTN x 24 hours - STAT MRI brain - Frequent neuro checks - PT consult, OT consult, Speech consult - Telemetry monitoring - Full stroke work up   Addendum: - Patient back from MRI which on preliminary review appears to show subtle diffusion abnormality in the right parietofrontal region along with a thin linear hypointensity on the gradient echo images in an adjacent sulcus that appears most consistent with acute thrombus - NIHSS now increased to 2 (facial droop and sensory deficit) - After comprehensive review of possible contraindications, he has no absolute contraindications to TNK administration. He denies any recent abnormal bleeding.  - Patient is a TNK candidate. Discussed extensively the risks/benefits of TNK treatment vs. no treatment with the patient and his girlfriend, including risks of hemorrhage and death with IV thrombolytic administration versus worse overall outcomes on average in patients within the thrombolysis time window who are not administered TNK. Overall benefits of TNK regarding long-term prognosis are felt to outweigh risks. The patient expressed understanding and wish to proceed with TNK. - STAT CTA of head and neck has been ordered - Admitting to Neuro ICU under the Neurology service.  - The patient wishes to be  designated Full Code - Post-TNK order set to include frequent neuro checks and BP management.  - No antiplatelet medications or anticoagulants for at least 24 hours following TNK.  - DVT prophylaxis with SCDs.  - Will need to be started on a statin.  - SSI - Will need to be started on antiplatelet therapy if follow up CT at 24 hours is negative for hemorrhagic conversion. - TTE.  - Cardiac telemetry - Fasting lipid panel, HgbA1c   Addendum: - CTA of head and neck: Occlusion of a distal right M3 branch, superior division. This corresponds with susceptibility artifact seen on prior MRI, and is consistent with intraluminal thrombus. Finding is in keeping with the previously identified small right MCA territory infarct. Additional irregularity with severe stenosis involving a proximal right M2 branch, inferior division. The remainder of the right MCA branches remain patent and perfused. Mild atheromatous change about the carotid siphons without hemodynamically significant stenosis. - EKG: Sinus rhythm; Atrial premature complex; Prolonged PR interval; Nonspecific T abnormalities, lateral leads   Addendum:  - Equivocal A-fib seen intermittently on the monitor.  - Repeat EKG ordered  - Will need Cardiology consult   Electronically signed: Dr. Caryl Pina 11/08/2022, 11:30 PM

## 2022-11-08 NOTE — ED Triage Notes (Signed)
Pt in from home via [facility] as code stroke, LSN at 2148 per girlfriend. She states around 10pm pt walked to the door and couldn't open it or talk. She also states he had intermittent slurred speech. EMS reports L sided numbness/tingling noticed at 2217 along with shuffled gait, dragging L foot. Pt's CBG 84 on EMS arrival, given juice and peanut butter, recheck 115. Stroke team met pt at bridge on ED arrival

## 2022-11-08 NOTE — ED Notes (Signed)
Pt transported to MRI 

## 2022-11-08 NOTE — Code Documentation (Signed)
Responded to Code Stroke called at 2226 for L sided weakness and slurred speech, [id number]. Pt arrived at 2246, CBG-86, CT head negative for acute changes. TNK not given-mild symptoms. Pt remains in TNK window until 0218. VS/neuro checks q77min until then. Plan to admit for stroke workup.

## 2022-11-08 NOTE — ED Provider Notes (Signed)
[place] EMERGENCY DEPARTMENT AT [facility] Provider Note   CSN: [id number] Arrival date & time: 11/08/22  2246  An emergency department physician performed an initial assessment on this suspected stroke patient at 2249.  History  Chief Complaint  Patient presents with   Code Stroke    Fonnie Welburn is a 57 y.o. male.  57 year old male brought in by EMS with concern for stroke.  Past medical history of hypertension, diabetes.  Patient last known well at 9:48 PM tonight when [place] the airport [place], noted that his speech was slurred and he was having difficulty opening the car door with his left hand.  Patient and girlfriend initially thought that he was having hypoglycemic event, called EMS for diabetic emergency.  EMS arrived on scene, blood sugar was found to be 84, he was provided with food and blood sugar improved to 119 however his symptoms of slurred speech, left arm weakness and feeling like his arm was "not my own" did not improve.  EMS notes no facial droop, does have difficulty walking with left leg weakness.  On arrival in the ER, symptoms seem to be improving somewhat.  Patient arrives activated as code stroke, is met in the department by the neurology team. At 1 point and told EMS that he was dizzy, no longer feeling this way, denies headache.  He is not having any difficulty breathing or chest pain.       Home Medications Prior to Admission medications   Medication Sig Start Date End Date Taking? Authorizing Provider  amLODipine (NORVASC) 10 MG tablet TAKE 1 TABLET BY MOUTH DAILY 05/23/22  Yes Nafziger, Kandee Keen, NP  bisoprolol (ZEBETA) 10 MG tablet TAKE 2 TABLETS BY MOUTH DAILY 01/16/22  Yes Nafziger, Kandee Keen, NP  buPROPion (WELLBUTRIN XL) 300 MG 24 hr tablet TAKE 1 TABLET BY MOUTH ONCE  DAILY Patient taking differently: Take 300 mg by mouth daily. 10/15/22  Yes Nafziger, Kandee Keen, NP  colchicine 0.6 MG tablet TAKE 1 TABLET BY MOUTH  DAILY.  WHEN HAVING FLARES  TAKE 1 TABLET BY MOUTH  TWICE DAILY Patient taking differently: Take 0.6 mg by mouth daily. 11/23/20  Yes Deveshwar, Janalyn Rouse, MD  Dulaglutide (TRULICITY) 0.75 MG/0.5ML SOPN Inject 0.75 mg in am weekly under skin 07/10/21  Yes Carlus Pavlov, MD  LANTUS SOLOSTAR 100 UNIT/ML Solostar Pen INJECT SUBCUTANEOUSLY 10 TO 12  UNITS DAILY Patient taking differently: Inject 30 Units into the skin daily. 07/30/22  Yes Carlus Pavlov, MD  omeprazole (PRILOSEC) 40 MG capsule Take 1 capsule (40 mg total) by mouth daily. 01/28/22  Yes Nafziger, Kandee Keen, NP  sildenafil (REVATIO) 20 MG tablet TAKE 2-5 TABLETS BY MOUTH AS NEEDED FOR SEXUAL ACTIVITY Patient taking differently: Take 20 mg by mouth daily as needed (For ED). 08/23/22  Yes Nafziger, Kandee Keen, NP  allopurinol (ZYLOPRIM) 300 MG tablet TAKE 1 TABLET BY MOUTH DAILY Patient not taking: Reported on 11/09/2022 04/16/22   Shirline Frees, NP  blood glucose meter kit and supplies Dispense based on patient and insurance preference. Use up to four times daily as directed. (FOR ICD-9 250.00, 250.01). 07/09/16   Rodolph Bong, MD  Dulaglutide (TRULICITY) 0.75 MG/0.5ML SOPN Inject 0.75 mg into the skin once a week. Patient not taking: Reported on 11/09/2022 07/04/22   Carlus Pavlov, MD  fenofibrate (TRICOR) 145 MG tablet Take 1 tablet (145 mg total) by mouth daily. Patient not taking: Reported on 11/09/2022 09/13/21   Shirline Frees, NP  Insulin Pen Needle (  B-D UF III MINI PEN NEEDLES) 31G X 5 MM MISC USE 4 TIMES DAILY AFTER MEALS AND AT BEDTIME 02/24/20   Carlus Pavlov, MD  Lancets [facility] ULTRASOFT) lancets Test blood sugars as directed. Dx E11.9 08/06/16   Shirline Frees, NP  ONE TOUCH ULTRA TEST test strip USE UP TO 4 TIMES DAILY AS DIRECTED 08/30/16   Nelwyn Salisbury, MD      Allergies    Patient has no known allergies.    Review of Systems   Review of Systems Negative except as per HPI Physical Exam Updated Vital Signs BP 139/87   Pulse 68    Temp 98.3 F (36.8 C)   Resp 20   Wt 117.2 kg   SpO2 100%   BMI 30.64 kg/m  Physical Exam Vitals and nursing note reviewed.  Constitutional:      General: He is not in acute distress.    Appearance: He is well-developed. He is not diaphoretic.  HENT:     Head: Normocephalic and atraumatic.     Mouth/Throat:     Mouth: Mucous membranes are moist.  Eyes:     Pupils: Pupils are equal, round, and reactive to light.  Cardiovascular:     Rate and Rhythm: Normal rate and regular rhythm.     Heart sounds: Normal heart sounds.  Pulmonary:     Effort: Pulmonary effort is normal.     Breath sounds: Normal breath sounds.  Skin:    General: Skin is warm and dry.     Findings: No erythema or rash.  Neurological:     Mental Status: He is alert and oriented to person, place, and time.     Sensory: Sensory deficit present.  Psychiatric:        Behavior: Behavior normal.     ED Results / Procedures / Treatments   Labs (all labs ordered are listed, but only abnormal results are displayed) Labs Reviewed  CBC - Abnormal; Notable for the following components:      Result Value   RBC 3.62 (*)    Hemoglobin 8.3 (*)    HCT 28.8 (*)    MCV 79.6 (*)    MCH 22.9 (*)    MCHC 28.8 (*)    RDW 16.3 (*)    All other components within normal limits  COMPREHENSIVE METABOLIC PANEL - Abnormal; Notable for the following components:   Potassium 3.3 (*)    Creatinine, Ser 1.66 (*)    Calcium 8.8 (*)    Alkaline Phosphatase 33 (*)    GFR, Estimated 49 (*)    All other components within normal limits  CBC - Abnormal; Notable for the following components:   RBC 3.79 (*)    Hemoglobin 8.9 (*)    HCT 30.4 (*)    MCH 23.5 (*)    MCHC 29.3 (*)    RDW 16.5 (*)    All other components within normal limits  BASIC METABOLIC PANEL - Abnormal; Notable for the following components:   Potassium 2.8 (*)    Creatinine, Ser 1.54 (*)    GFR, Estimated 53 (*)    All other components within normal limits   MAGNESIUM - Abnormal; Notable for the following components:   Magnesium 1.3 (*)    All other components within normal limits  I-STAT CHEM 8, ED - Abnormal; Notable for the following components:   Potassium 3.3 (*)    Creatinine, Ser 1.80 (*)    Calcium, Ion 1.12 (*)  Hemoglobin 10.2 (*)    HCT 30.0 (*)    All other components within normal limits  PROTIME-INR  APTT  DIFFERENTIAL  ETHANOL  LIPID PANEL  HEMOGLOBIN A1C  PHOSPHORUS  HIV ANTIBODY (ROUTINE TESTING W REFLEX)  CBG MONITORING, ED    EKG EKG Interpretation  Date/Time:  Friday Nov 08 2022 23:14:57 EDT Ventricular Rate:  72 PR Interval:  244 QRS Duration: 89 QT Interval:  396 QTC Calculation: 434 R Axis:   11 Text Interpretation: Sinus rhythm Atrial premature complex Prolonged PR interval Nonspecific T abnormalities, lateral leads Confirmed by Tilden Fossa [phone]) on 11/09/2022 1:16:28 AM  Radiology CT ANGIO HEAD NECK W WO CM  Result Date: 11/09/2022 CLINICAL DATA:  Initial evaluation for neuro deficit, stroke. EXAM: CT ANGIOGRAPHY HEAD AND NECK WITH AND WITHOUT CONTRAST TECHNIQUE: Multidetector CT imaging of the head and neck was performed using the standard protocol during bolus administration of intravenous contrast. Multiplanar CT image reconstructions and MIPs were obtained to evaluate the vascular anatomy. Carotid stenosis measurements (when applicable) are obtained utilizing NASCET criteria, using the distal internal carotid diameter as the denominator. RADIATION DOSE REDUCTION: This exam was performed according to the departmental dose-optimization program which includes automated exposure control, adjustment of the mA and/or kV according to patient size and/or use of iterative reconstruction technique. CONTRAST:  75mL OMNIPAQUE IOHEXOL 350 MG/ML SOLN COMPARISON:  Prior CT and MRI from earlier the same evening. FINDINGS: CTA NECK FINDINGS Aortic arch: Visualized aortic arch normal caliber with normal 3 vessel  morphology. Mild atheromatous change about the arch itself. No stenosis about the origin of the great vessels. Right carotid system: Right common and internal carotid arteries are patent without stenosis or dissection. Left carotid system: Left common and internal carotid arteries are patent without stenosis or dissection. Vertebral arteries: Both vertebral arteries arise from subclavian arteries. No proximal subclavian artery stenosis. Vertebral arteries are patent without stenosis or dissection. Skeleton: No discrete or worrisome osseous lesions. Mild for age spondylosis present at C5-6 and C6-7. Other neck: No other acute abnormality within the neck. Upper chest: Visualized upper chest demonstrates no acute finding. Review of the MIP images confirms the above findings CTA HEAD FINDINGS Anterior circulation: Is mild atheromatous change about the carotid siphons without hemodynamically significant stenosis. A1 segments patent bilaterally. Normal anterior communicating complex. Anterior cerebral arteries patent without stenosis. No M1 stenosis or occlusion. Distal left MCA branches are patent and well perfused. On the right, there is irregularity with probable severe stenosis involving a proximal right M2 branch just distal to the bifurcation (series 7, image 130), inferior division. Additionally, there is occlusion of a distal right M3 branch, superior division (series 7, image 108). This corresponds with susceptibility artifact seen on prior brain MRI, consistent with intraluminal thrombus. The remainder of the right MCA branches remain patent and perfused. Posterior circulation: Both V4 segments patent without stenosis. Both PICA grossly patent at their origins. Basilar patent without stenosis. Superior cerebral arteries patent bilaterally. Both PCAs primarily supplied via the basilar well perfused or distal aspects. Venous sinuses: Patent allowing for timing the contrast bolus. Anatomic variants: None  significant.  No aneurysm. Review of the MIP images confirms the above findings IMPRESSION: 1. Occlusion of a distal right M3 branch, superior division. This corresponds with susceptibility artifact seen on prior MRI, and is consistent with intraluminal thrombus. Finding is in keeping with the previously identified small right MCA territory infarct. 2. Additional irregularity with severe stenosis involving a proximal right M2 branch, inferior  division. The remainder of the right MCA branches remain patent and perfused. 3. Mild atheromatous change about the carotid siphons without hemodynamically significant stenosis. Case discussed by telephone around the time of image acquisition 11/09/2022 at approximately 1:20 a.m. with provider ERIC LINDZEN. Electronically Signed   By: Rise Mu M.D.   On: 11/09/2022 02:20   MR BRAIN WO CONTRAST  Result Date: 11/09/2022 CLINICAL DATA:  Initial evaluation for neuro deficit, stroke. EXAM: MRI HEAD WITHOUT CONTRAST TECHNIQUE: Multiplanar, multiecho pulse sequences of the brain and surrounding structures were obtained without intravenous contrast. COMPARISON:  Prior study from 11/08/2022. FINDINGS: Brain: Cerebral volume within normal limits. No significant cerebral white matter disease for age. Subtle small focus of restricted diffusion is seen involving the posterior right corona radiata, extending towards the external capsule (series 2, image 32). This measures approximately 2.3 cm in length. Associated signal loss on ADC map (series 250, image 32). Finding suspicious for a tiny acute right MCA territory infarct. No associated hemorrhage or mass effect. No other evidence for acute or subacute ischemia. Gray-white matter differentiation otherwise maintained. No areas of chronic cortical infarction. No acute or chronic intracranial blood products. No mass lesion, midline shift or mass effect no hydrocephalus or extra-axial fluid collection. Pituitary gland suprasellar  region within normal limits. Vascular: Small focus of susceptibility artifact at the level of the right sylvian fissure, suspected to reflect small volume intraluminal thrombus within a distal right MCA branch (series 8, image 59). Major intracranial vascular flow voids are otherwise maintained. Skull and upper cervical spine: Craniocervical junction within normal limits. Bone marrow signal intensity normal. No scalp soft tissue abnormality. Sinuses/Orbits: Globes orbital soft tissues within normal limits. Chronic right frontoethmoidal sinus disease. Mild mucosal thickening noted about the maxillary sinuses. Superimposed small right maxillary sinus retention cyst. No mastoid effusion. Other: None. IMPRESSION: 1. Subtle 2.3 cm focus of restricted diffusion involving the posterior right corona radiata/external capsule, consistent with a small acute right MCA territory infarct. No associated hemorrhage or mass effect. 2. Small focus of susceptibility artifact at the level of the right Sylvian fissure, suspected to reflect intraluminal thrombus within a distal right MCA branch. 3. Otherwise normal brain MRI for age. 4. Chronic right frontoethmoidal sinus disease. Electronically Signed   By: Rise Mu M.D.   On: 11/09/2022 01:22   CT HEAD CODE STROKE WO CONTRAST  Result Date: 11/08/2022 CLINICAL DATA:  Code stroke. Initial evaluation for neuro deficit, stroke. EXAM: CT HEAD WITHOUT CONTRAST TECHNIQUE: Contiguous axial images were obtained from the base of the skull through the vertex without intravenous contrast. RADIATION DOSE REDUCTION: This exam was performed according to the departmental dose-optimization program which includes automated exposure control, adjustment of the mA and/or kV according to patient size and/or use of iterative reconstruction technique. COMPARISON:  None Available. FINDINGS: Brain: Cerebral volume within normal limits. No acute intracranial hemorrhage. No acute large vessel  territory infarct. No mass lesion, mass effect or midline shift. No hydrocephalus or extra-axial fluid collection. Vascular: No appreciable abnormal hyperdense vessel. Scattered vascular calcifications noted within the carotid siphons. Skull: Scalp soft tissues and calvarium demonstrate no acute finding. Sinuses/Orbits: Globes orbital soft tissues within normal limits. Chronic right frontoethmoidal sinusitis noted. Mild mucosal thickening noted about the ethmoidal air cells as well. Mastoid air cells are clear. Other: None. ASPECTS [facility] Stroke Program Early CT Score) - Ganglionic level infarction (caudate, lentiform nuclei, internal capsule, insula, M1-M3 cortex): 7 - Supraganglionic infarction (M4-M6 cortex): 3 Total score (0-10 with 10 being  normal): 10 IMPRESSION: 1. No acute intracranial abnormality. 2. ASPECTS is 10. 3. Chronic right frontoethmoidal sinusitis. These results were communicated to Dr. Otelia Limes at 11:06 pm on 11/08/2022 by text page via the [employer] messaging system. Electronically Signed   By: Rise Mu M.D.   On: 11/08/2022 23:07    Procedures .Critical Care  Performed by: Jeannie Fend, PA-C Authorized by: Jeannie Fend, PA-C   Critical care provider statement:    Critical care time (minutes):  30   Critical care was time spent personally by me on the following activities:  Development of treatment plan with patient or surrogate, discussions with consultants, evaluation of patient's response to treatment, examination of patient, ordering and review of laboratory studies, ordering and review of radiographic studies, ordering and performing treatments and interventions, pulse oximetry, re-evaluation of patient's condition and review of old charts     Medications Ordered in ED Medications  enoxaparin (LOVENOX) injection 40 mg (has no administration in time range)  sodium chloride flush (NS) 0.9 % injection 3 mL (3 mLs Intravenous Given 11/08/22 2342)  aspirin chewable  tablet 650 mg (648 mg Oral Given 11/08/22 2329)  tenecteplase (TNKASE) injection for Stroke 25 mg (25 mg Intravenous Given 11/09/22 0107)  iohexol (OMNIPAQUE) 350 MG/ML injection 75 mL (75 mLs Intravenous Contrast Given 11/09/22 0111)    ED Course/ Medical Decision Making/ A&P                             Medical Decision Making Amount and/or Complexity of Data Reviewed Radiology: ordered.  Risk Decision regarding hospitalization.   This patient presents to the ED for concern of left hand weakness, change in speech, this involves an extensive number of treatment options, and is a complaint that carries with it a high risk of complications and morbidity.  The differential diagnosis includes but not limited to CVA, metabolic disturbance, mass   Co morbidities that complicate the patient evaluation  HTN, DM,    Additional history obtained:  Additional history obtained from EMS who contributes to history as above as well as from girlfriend  External records from outside source obtained and reviewed including prior labs on file   Lab Tests:  I Ordered, and personally interpreted labs.  The pertinent results include: BMP with mild hypokalemia with potassium 3.3.  Creatinine mildly elevated 1.54.  CBC with hemoglobin 8.9, downtrending from prior.   Imaging Studies ordered:  I ordered imaging studies including CT head I independently visualized and interpreted imaging which showed no acute abnormality I agree with the radiologist interpretation   Cardiac Monitoring: / EKG:  The patient was maintained on a cardiac monitor.  I personally viewed and interpreted the cardiac monitored which showed an underlying rhythm of: Sinus rhythm, 72   Consultations Obtained:  I requested consultation with the neurohospitalist, Dr. Otelia Limes,  and discussed lab and imaging findings as well as pertinent plan - they recommend: ASA load at 650mg , hold Plavix, MRI brain and admit to hospitalist.   Case discussed with Dr. Margo Aye with Triage Hospitalist Service with plan to admit however when symptoms progressed, patient was given TNK and then admitted to the neuro service.    Problem List / ED Course / Critical interventions / Medication management  57 year old male brought in by EMS code stroke for speech changes and weakness in left hand.  On arrival, symptoms improving.  Patient was greeted by our neuroteam on arrival and went straight  to CT scanner, able to maintain his own airway.  Patient was able to ambulate with slight gait disturbance around the CT table, complete neurological evaluation as completed by neurohospitalist Dr. Otelia Limes on arrival.  CT head was unremarkable.  Discussion held regarding TNK, patient agreeable to forego treatment initially as symptoms were improving and was minimal.  Patient went to MRI, he returned to the room and his significant other noticed that he was having left-sided facial droop with drooling, worsening of speech.  I was called to the room at that time and patient was trying to get out of bed to go to the bathroom, his gait was notably worse than when he was first evaluated with left-sided mouth droop.  Dr. Otelia Limes was notified, NIH rescored at 2. Discussion regarding TNK, plan is to treat, patient agreeable and consents. MRI with evidence of acute CVA. Sent for CTA which was negative for LVO. Patient admitted to neuro service.  I have reviewed the patients home medicines and have made adjustments as needed   Social Determinants of Health:  Lives with girlfriend   Test / Admission - Considered:  Admit         Final Clinical Impression(s) / ED Diagnoses Final diagnoses:  Cerebrovascular accident (CVA), unspecified mechanism [facility])    Rx / DC Orders ED Discharge Orders     None         Alden Hipp 11/09/22 1191    Tilden Fossa, MD 11/13/22 [phone]

## 2022-11-09 ENCOUNTER — Observation Stay (HOSPITAL_COMMUNITY): Payer: BC Managed Care – PPO

## 2022-11-09 ENCOUNTER — Inpatient Hospital Stay (HOSPITAL_COMMUNITY): Payer: BC Managed Care – PPO

## 2022-11-09 DIAGNOSIS — I6602 Occlusion and stenosis of left middle cerebral artery: Secondary | ICD-10-CM | POA: Diagnosis not present

## 2022-11-09 DIAGNOSIS — M109 Gout, unspecified: Secondary | ICD-10-CM | POA: Diagnosis present

## 2022-11-09 DIAGNOSIS — R200 Anesthesia of skin: Secondary | ICD-10-CM | POA: Diagnosis present

## 2022-11-09 DIAGNOSIS — I4891 Unspecified atrial fibrillation: Secondary | ICD-10-CM | POA: Diagnosis present

## 2022-11-09 DIAGNOSIS — Z7985 Long-term (current) use of injectable non-insulin antidiabetic drugs: Secondary | ICD-10-CM | POA: Diagnosis not present

## 2022-11-09 DIAGNOSIS — I6389 Other cerebral infarction: Secondary | ICD-10-CM | POA: Diagnosis not present

## 2022-11-09 DIAGNOSIS — F32A Depression, unspecified: Secondary | ICD-10-CM | POA: Diagnosis present

## 2022-11-09 DIAGNOSIS — F419 Anxiety disorder, unspecified: Secondary | ICD-10-CM | POA: Diagnosis present

## 2022-11-09 DIAGNOSIS — I440 Atrioventricular block, first degree: Secondary | ICD-10-CM

## 2022-11-09 DIAGNOSIS — Z803 Family history of malignant neoplasm of breast: Secondary | ICD-10-CM | POA: Diagnosis not present

## 2022-11-09 DIAGNOSIS — Z79899 Other long term (current) drug therapy: Secondary | ICD-10-CM | POA: Diagnosis not present

## 2022-11-09 DIAGNOSIS — I639 Cerebral infarction, unspecified: Secondary | ICD-10-CM | POA: Diagnosis present

## 2022-11-09 DIAGNOSIS — I493 Ventricular premature depolarization: Secondary | ICD-10-CM

## 2022-11-09 DIAGNOSIS — E1122 Type 2 diabetes mellitus with diabetic chronic kidney disease: Secondary | ICD-10-CM | POA: Diagnosis present

## 2022-11-09 DIAGNOSIS — E876 Hypokalemia: Secondary | ICD-10-CM | POA: Diagnosis present

## 2022-11-09 DIAGNOSIS — R29810 Facial weakness: Secondary | ICD-10-CM | POA: Diagnosis present

## 2022-11-09 DIAGNOSIS — F1010 Alcohol abuse, uncomplicated: Secondary | ICD-10-CM | POA: Diagnosis present

## 2022-11-09 DIAGNOSIS — I63511 Cerebral infarction due to unspecified occlusion or stenosis of right middle cerebral artery: Secondary | ICD-10-CM | POA: Diagnosis present

## 2022-11-09 DIAGNOSIS — D631 Anemia in chronic kidney disease: Secondary | ICD-10-CM | POA: Diagnosis present

## 2022-11-09 DIAGNOSIS — K219 Gastro-esophageal reflux disease without esophagitis: Secondary | ICD-10-CM | POA: Diagnosis present

## 2022-11-09 DIAGNOSIS — R4781 Slurred speech: Secondary | ICD-10-CM | POA: Diagnosis present

## 2022-11-09 DIAGNOSIS — I129 Hypertensive chronic kidney disease with stage 1 through stage 4 chronic kidney disease, or unspecified chronic kidney disease: Secondary | ICD-10-CM | POA: Diagnosis present

## 2022-11-09 DIAGNOSIS — Z794 Long term (current) use of insulin: Secondary | ICD-10-CM | POA: Diagnosis not present

## 2022-11-09 DIAGNOSIS — G8194 Hemiplegia, unspecified affecting left nondominant side: Secondary | ICD-10-CM | POA: Diagnosis present

## 2022-11-09 DIAGNOSIS — R29702 NIHSS score 2: Secondary | ICD-10-CM | POA: Diagnosis present

## 2022-11-09 DIAGNOSIS — E785 Hyperlipidemia, unspecified: Secondary | ICD-10-CM | POA: Diagnosis present

## 2022-11-09 DIAGNOSIS — I491 Atrial premature depolarization: Secondary | ICD-10-CM | POA: Diagnosis present

## 2022-11-09 LAB — RAPID URINE DRUG SCREEN, HOSP PERFORMED
Amphetamines: NOT DETECTED
Barbiturates: NOT DETECTED
Benzodiazepines: NOT DETECTED
Cocaine: NOT DETECTED
Opiates: NOT DETECTED
Tetrahydrocannabinol: POSITIVE — AB

## 2022-11-09 LAB — CBC
HCT: 30.4 % — ABNORMAL LOW (ref 39.0–52.0)
Hemoglobin: 8.9 g/dL — ABNORMAL LOW (ref 13.0–17.0)
MCH: 23.5 pg — ABNORMAL LOW (ref 26.0–34.0)
MCHC: 29.3 g/dL — ABNORMAL LOW (ref 30.0–36.0)
MCV: 80.2 fL (ref 80.0–100.0)
Platelets: 376 10*3/uL (ref 150–400)
RBC: 3.79 MIL/uL — ABNORMAL LOW (ref 4.22–5.81)
RDW: 16.5 % — ABNORMAL HIGH (ref 11.5–15.5)
WBC: 8.3 10*3/uL (ref 4.0–10.5)
nRBC: 0 % (ref 0.0–0.2)

## 2022-11-09 LAB — MAGNESIUM
Magnesium: 1.3 mg/dL — ABNORMAL LOW (ref 1.7–2.4)
Magnesium: 1.3 mg/dL — ABNORMAL LOW (ref 1.7–2.4)
Magnesium: 1.8 mg/dL (ref 1.7–2.4)

## 2022-11-09 LAB — BASIC METABOLIC PANEL
Anion gap: 13 (ref 5–15)
Anion gap: 9 (ref 5–15)
BUN: 16 mg/dL (ref 6–20)
BUN: 9 mg/dL (ref 6–20)
CO2: 23 mmol/L (ref 22–32)
CO2: 24 mmol/L (ref 22–32)
Calcium: 8.9 mg/dL (ref 8.9–10.3)
Calcium: 9.1 mg/dL (ref 8.9–10.3)
Chloride: 103 mmol/L (ref 98–111)
Chloride: 103 mmol/L (ref 98–111)
Creatinine, Ser: 1.54 mg/dL — ABNORMAL HIGH (ref 0.61–1.24)
Creatinine, Ser: 1.37 mg/dL — ABNORMAL HIGH (ref 0.61–1.24)
GFR, Estimated: 53 mL/min — ABNORMAL LOW (ref >60)
GFR, Estimated: >60 mL/min (ref >60)
Glucose, Bld: 85 mg/dL (ref 70–99)
Glucose, Bld: 107 mg/dL — ABNORMAL HIGH (ref 70–99)
Potassium: 2.8 mmol/L — ABNORMAL LOW (ref 3.5–5.1)
Potassium: 3.5 mmol/L (ref 3.5–5.1)
Sodium: 139 mmol/L (ref 135–145)
Sodium: 136 mmol/L (ref 135–145)

## 2022-11-09 LAB — GLUCOSE, CAPILLARY
Glucose-Capillary: 85 mg/dL (ref 70–99)
Glucose-Capillary: 88 mg/dL (ref 70–99)
Glucose-Capillary: 90 mg/dL (ref 70–99)
Glucose-Capillary: 97 mg/dL (ref 70–99)

## 2022-11-09 LAB — TSH: TSH: 1.813 u[IU]/mL (ref 0.350–4.500)

## 2022-11-09 LAB — ANTITHROMBIN III: AntiThromb III Func: 93 % (ref 75–120)

## 2022-11-09 LAB — LIPID PANEL
Cholesterol: 199 mg/dL (ref 0–200)
HDL: 76 mg/dL (ref >40)
LDL Cholesterol: 96 mg/dL (ref 0–99)
Total CHOL/HDL Ratio: 2.6 RATIO
Triglycerides: 135 mg/dL (ref <150)
VLDL: 27 mg/dL (ref 0–40)

## 2022-11-09 LAB — HIV ANTIBODY (ROUTINE TESTING W REFLEX): HIV Screen 4th Generation wRfx: NONREACTIVE

## 2022-11-09 LAB — ECHOCARDIOGRAM COMPLETE
Area-P 1/2: 3.17 cm2
Height: 78 in
S' Lateral: 3 cm
Weight: 3940.06 oz

## 2022-11-09 LAB — PHOSPHORUS: Phosphorus: 3.6 mg/dL (ref 2.5–4.6)

## 2022-11-09 LAB — HEMOGLOBIN A1C
Hgb A1c MFr Bld: 5.6 % (ref 4.8–5.6)
Mean Plasma Glucose: 114.02 mg/dL

## 2022-11-09 MED ORDER — POTASSIUM CHLORIDE 10 MEQ/100ML IV SOLN
10.0000 meq | INTRAVENOUS | Status: AC
  Administered 2022-11-09 (×3): 10 meq via INTRAVENOUS
  Filled 2022-11-09 (×3): qty 100

## 2022-11-09 MED ORDER — CHLORHEXIDINE GLUCONATE CLOTH 2 % EX PADS
6.0000 | MEDICATED_PAD | Freq: Every morning | CUTANEOUS | Status: DC
  Administered 2022-11-09: 6 via TOPICAL

## 2022-11-09 MED ORDER — ACETAMINOPHEN 160 MG/5ML PO SOLN: 650.0000 mg | ORAL | Status: DC | PRN

## 2022-11-09 MED ORDER — ENOXAPARIN SODIUM 40 MG/0.4ML IJ SOSY
40.0000 mg | PREFILLED_SYRINGE | Freq: Every day | INTRAMUSCULAR | Status: DC
  Filled 2022-11-09: qty 0.4

## 2022-11-09 MED ORDER — SENNOSIDES-DOCUSATE SODIUM 8.6-50 MG PO TABS: 1.0000 | ORAL_TABLET | Freq: Every evening | ORAL | Status: DC | PRN

## 2022-11-09 MED ORDER — ACETAMINOPHEN 325 MG PO TABS: 650.0000 mg | ORAL_TABLET | ORAL | Status: DC | PRN

## 2022-11-09 MED ORDER — HYDRALAZINE HCL 20 MG/ML IJ SOLN: 10.0000 mg | Freq: Four times a day (QID) | INTRAMUSCULAR | Status: DC | PRN

## 2022-11-09 MED ORDER — IOHEXOL 350 MG/ML SOLN
75.0000 mL | Freq: Once | INTRAVENOUS | Status: AC | PRN
  Administered 2022-11-09: 75 mL via INTRAVENOUS

## 2022-11-09 MED ORDER — THIAMINE MONONITRATE 100 MG PO TABS
100.0000 mg | ORAL_TABLET | Freq: Every day | ORAL | Status: DC
  Administered 2022-11-09 – 2022-11-10 (×2): 100 mg via ORAL
  Filled 2022-11-09 (×2): qty 1

## 2022-11-09 MED ORDER — BISOPROLOL FUMARATE 10 MG PO TABS
20.0000 mg | ORAL_TABLET | Freq: Every day | ORAL | Status: DC
  Administered 2022-11-09 – 2022-11-10 (×2): 20 mg via ORAL
  Filled 2022-11-09 (×2): qty 2

## 2022-11-09 MED ORDER — BUPROPION HCL ER (XL) 300 MG PO TB24
300.0000 mg | ORAL_TABLET | Freq: Every day | ORAL | Status: DC
  Administered 2022-11-09 – 2022-11-10 (×2): 300 mg via ORAL
  Filled 2022-11-09 (×2): qty 1

## 2022-11-09 MED ORDER — COLCHICINE 0.6 MG PO TABS
0.6000 mg | ORAL_TABLET | Freq: Every day | ORAL | Status: DC
  Administered 2022-11-09 – 2022-11-10 (×2): 0.6 mg via ORAL
  Filled 2022-11-09 (×2): qty 1

## 2022-11-09 MED ORDER — POTASSIUM CHLORIDE CRYS ER 20 MEQ PO TBCR
40.0000 meq | EXTENDED_RELEASE_TABLET | Freq: Once | ORAL | Status: AC
  Administered 2022-11-09: 40 meq via ORAL
  Filled 2022-11-09: qty 2

## 2022-11-09 MED ORDER — AMLODIPINE BESYLATE 10 MG PO TABS
10.0000 mg | ORAL_TABLET | Freq: Every day | ORAL | Status: DC
  Administered 2022-11-09 – 2022-11-10 (×2): 10 mg via ORAL
  Filled 2022-11-09 (×2): qty 1

## 2022-11-09 MED ORDER — INSULIN ASPART 100 UNIT/ML IJ SOLN: 0.0000 [IU] | Freq: Three times a day (TID) | INTRAMUSCULAR | Status: DC

## 2022-11-09 MED ORDER — ADULT MULTIVITAMIN W/MINERALS CH
1.0000 | ORAL_TABLET | Freq: Every day | ORAL | Status: DC
  Administered 2022-11-09 – 2022-11-10 (×2): 1 via ORAL
  Filled 2022-11-09 (×2): qty 1

## 2022-11-09 MED ORDER — CHLORHEXIDINE GLUCONATE CLOTH 2 % EX PADS
6.0000 | MEDICATED_PAD | Freq: Once | CUTANEOUS | Status: AC
  Administered 2022-11-09: 6 via TOPICAL

## 2022-11-09 MED ORDER — ROSUVASTATIN CALCIUM 20 MG PO TABS
20.0000 mg | ORAL_TABLET | Freq: Every day | ORAL | Status: DC
  Administered 2022-11-09 – 2022-11-10 (×2): 20 mg via ORAL
  Filled 2022-11-09 (×2): qty 1

## 2022-11-09 MED ORDER — PANTOPRAZOLE SODIUM 40 MG IV SOLR
40.0000 mg | Freq: Every day | INTRAVENOUS | Status: DC
  Administered 2022-11-09: 40 mg via INTRAVENOUS
  Filled 2022-11-09: qty 10

## 2022-11-09 MED ORDER — TENECTEPLASE FOR STROKE
25.0000 mg | PACK | Freq: Once | INTRAVENOUS | Status: AC
  Administered 2022-11-09: 25 mg via INTRAVENOUS
  Filled 2022-11-09: qty 10

## 2022-11-09 MED ORDER — ENOXAPARIN SODIUM 40 MG/0.4ML IJ SOSY: 40.0000 mg | PREFILLED_SYRINGE | Freq: Every day | INTRAMUSCULAR | Status: DC

## 2022-11-09 MED ORDER — MAGNESIUM SULFATE 2 GM/50ML IV SOLN
2.0000 g | Freq: Once | INTRAVENOUS | Status: AC
  Administered 2022-11-09: 2 g via INTRAVENOUS
  Filled 2022-11-09: qty 50

## 2022-11-09 MED ORDER — STROKE: EARLY STAGES OF RECOVERY BOOK
Freq: Once | Status: AC
  Filled 2022-11-09: qty 1

## 2022-11-09 MED ORDER — LABETALOL HCL 5 MG/ML IV SOLN: 10.0000 mg | INTRAVENOUS | Status: DC | PRN

## 2022-11-09 MED ORDER — SODIUM CHLORIDE 0.9 % IV SOLN: INTRAVENOUS | Status: DC

## 2022-11-09 MED ORDER — ACETAMINOPHEN 650 MG RE SUPP: 650.0000 mg | RECTAL | Status: DC | PRN

## 2022-11-09 MED ORDER — FOLIC ACID 1 MG PO TABS
1.0000 mg | ORAL_TABLET | Freq: Every day | ORAL | Status: DC
  Administered 2022-11-09 – 2022-11-10 (×2): 1 mg via ORAL
  Filled 2022-11-09 (×2): qty 1

## 2022-11-09 MED ORDER — CLEVIDIPINE BUTYRATE 0.5 MG/ML IV EMUL: 0.0000 mg/h | INTRAVENOUS | Status: DC

## 2022-11-09 NOTE — Progress Notes (Signed)
  Echocardiogram 2D Echocardiogram has been performed.  Keith Leblanc 11/09/2022, 3:42 PM

## 2022-11-09 NOTE — Progress Notes (Addendum)
STROKE TEAM PROGRESS NOTE   INTERVAL HISTORY His family is at the bedside.  He states yesterday he developed acute onset of left arm numbness and tingling and left arm weakness and speech difficulties with slurred speech.  He also states that he was told he was dragging his left foot but he really did not notice any left leg weakness.  He was given TNK at 1:00 in the morning.  He states his symptoms have pretty much resolved, still has some mild left-sided weakness.  He endorses drinking about 1 pint of tequila daily and smokes marijuana with UDS positive for marijuana.  Will initiate CIWA protocol. MRI brain with small acute right MCA infarct 24-hour brain imaging CT head scheduled for 1:00 in the morning Will also check ultrasound BLE to rule out DVTs and hypercoagulable panel Questionable A-fib on the monitor will check EKG and consult cardiology   Vitals:   11/09/22 1130 11/09/22 1200 11/09/22 1230 11/09/22 1300  BP: (!) 140/86 (!) 144/69 136/78 133/71  Pulse: 66 71 67 66  Resp: 15 15 16 17   Temp:      TempSrc:      SpO2: 98% 96% 98% 97%  Weight:      Height:       CBC:  Recent Labs  Lab 11/08/22 2251 11/08/22 2252 11/09/22 0045  WBC 6.6  --  8.3  NEUTROABS 2.8  --   --   HGB 8.3* 10.2* 8.9*  HCT 28.8* 30.0* 30.4*  MCV 79.6*  --  80.2  PLT 350  --  376   Basic Metabolic Panel:  Recent Labs  Lab 11/08/22 2251 11/08/22 2252 11/09/22 0045  NA 140 141 139  K 3.3* 3.3* 2.8*  CL 106 105 103  CO2 24  --  23  GLUCOSE 89 86 85  BUN 18 19 16   CREATININE 1.66* 1.80* 1.54*  CALCIUM 8.8*  --  8.9  MG  --   --  1.3*  PHOS  --   --  3.6   Lipid Panel:  Recent Labs  Lab 11/09/22 0045  CHOL 199  TRIG 135  HDL 76  CHOLHDL 2.6  VLDL 27  LDLCALC 96   HgbA1c:  Recent Labs  Lab 11/09/22 0045  HGBA1C 5.6   Urine Drug Screen:  Recent Labs  Lab 11/09/22 0901  LABOPIA NONE DETECTED  COCAINSCRNUR NONE DETECTED  LABBENZ NONE DETECTED  AMPHETMU NONE DETECTED  THCU  POSITIVE*  LABBARB NONE DETECTED    Alcohol Level  Recent Labs  Lab 11/08/22 2251  ETH <10    IMAGING past 24 hours CT ANGIO HEAD NECK W WO CM  Result Date: 11/09/2022 CLINICAL DATA:  Initial evaluation for neuro deficit, stroke. EXAM: CT ANGIOGRAPHY HEAD AND NECK WITH AND WITHOUT CONTRAST TECHNIQUE: Multidetector CT imaging of the head and neck was performed using the standard protocol during bolus administration of intravenous contrast. Multiplanar CT image reconstructions and MIPs were obtained to evaluate the vascular anatomy. Carotid stenosis measurements (when applicable) are obtained utilizing NASCET criteria, using the distal internal carotid diameter as the denominator. RADIATION DOSE REDUCTION: This exam was performed according to the departmental dose-optimization program which includes automated exposure control, adjustment of the mA and/or kV according to patient size and/or use of iterative reconstruction technique. CONTRAST:  75mL OMNIPAQUE IOHEXOL 350 MG/ML SOLN COMPARISON:  Prior CT and MRI from earlier the same evening. FINDINGS: CTA NECK FINDINGS Aortic arch: Visualized aortic arch normal caliber with normal 3 vessel morphology. Mild  atheromatous change about the arch itself. No stenosis about the origin of the great vessels. Right carotid system: Right common and internal carotid arteries are patent without stenosis or dissection. Left carotid system: Left common and internal carotid arteries are patent without stenosis or dissection. Vertebral arteries: Both vertebral arteries arise from subclavian arteries. No proximal subclavian artery stenosis. Vertebral arteries are patent without stenosis or dissection. Skeleton: No discrete or worrisome osseous lesions. Mild for age spondylosis present at C5-6 and C6-7. Other neck: No other acute abnormality within the neck. Upper chest: Visualized upper chest demonstrates no acute finding. Review of the MIP images confirms the above findings  CTA HEAD FINDINGS Anterior circulation: Is mild atheromatous change about the carotid siphons without hemodynamically significant stenosis. A1 segments patent bilaterally. Normal anterior communicating complex. Anterior cerebral arteries patent without stenosis. No M1 stenosis or occlusion. Distal left MCA branches are patent and well perfused. On the right, there is irregularity with probable severe stenosis involving a proximal right M2 branch just distal to the bifurcation (series 7, image 130), inferior division. Additionally, there is occlusion of a distal right M3 branch, superior division (series 7, image 108). This corresponds with susceptibility artifact seen on prior brain MRI, consistent with intraluminal thrombus. The remainder of the right MCA branches remain patent and perfused. Posterior circulation: Both V4 segments patent without stenosis. Both PICA grossly patent at their origins. Basilar patent without stenosis. Superior cerebral arteries patent bilaterally. Both PCAs primarily supplied via the basilar well perfused or distal aspects. Venous sinuses: Patent allowing for timing the contrast bolus. Anatomic variants: None significant.  No aneurysm. Review of the MIP images confirms the above findings IMPRESSION: 1. Occlusion of a distal right M3 branch, superior division. This corresponds with susceptibility artifact seen on prior MRI, and is consistent with intraluminal thrombus. Finding is in keeping with the previously identified small right MCA territory infarct. 2. Additional irregularity with severe stenosis involving a proximal right M2 branch, inferior division. The remainder of the right MCA branches remain patent and perfused. 3. Mild atheromatous change about the carotid siphons without hemodynamically significant stenosis. Case discussed by telephone around the time of image acquisition 11/09/2022 at approximately 1:20 a.m. with provider ERIC LINDZEN. Electronically Signed   By: Rise Mu M.D.   On: 11/09/2022 02:20   MR BRAIN WO CONTRAST  Result Date: 11/09/2022 CLINICAL DATA:  Initial evaluation for neuro deficit, stroke. EXAM: MRI HEAD WITHOUT CONTRAST TECHNIQUE: Multiplanar, multiecho pulse sequences of the brain and surrounding structures were obtained without intravenous contrast. COMPARISON:  Prior study from 11/08/2022. FINDINGS: Brain: Cerebral volume within normal limits. No significant cerebral white matter disease for age. Subtle small focus of restricted diffusion is seen involving the posterior right corona radiata, extending towards the external capsule (series 2, image 32). This measures approximately 2.3 cm in length. Associated signal loss on ADC map (series 250, image 32). Finding suspicious for a tiny acute right MCA territory infarct. No associated hemorrhage or mass effect. No other evidence for acute or subacute ischemia. Gray-white matter differentiation otherwise maintained. No areas of chronic cortical infarction. No acute or chronic intracranial blood products. No mass lesion, midline shift or mass effect no hydrocephalus or extra-axial fluid collection. Pituitary gland suprasellar region within normal limits. Vascular: Small focus of susceptibility artifact at the level of the right sylvian fissure, suspected to reflect small volume intraluminal thrombus within a distal right MCA branch (series 8, image 59). Major intracranial vascular flow voids are otherwise maintained. Skull and  upper cervical spine: Craniocervical junction within normal limits. Bone marrow signal intensity normal. No scalp soft tissue abnormality. Sinuses/Orbits: Globes orbital soft tissues within normal limits. Chronic right frontoethmoidal sinus disease. Mild mucosal thickening noted about the maxillary sinuses. Superimposed small right maxillary sinus retention cyst. No mastoid effusion. Other: None. IMPRESSION: 1. Subtle 2.3 cm focus of restricted diffusion involving the posterior  right corona radiata/external capsule, consistent with a small acute right MCA territory infarct. No associated hemorrhage or mass effect. 2. Small focus of susceptibility artifact at the level of the right Sylvian fissure, suspected to reflect intraluminal thrombus within a distal right MCA branch. 3. Otherwise normal brain MRI for age. 4. Chronic right frontoethmoidal sinus disease. Electronically Signed   By: Rise Mu M.D.   On: 11/09/2022 01:22   CT HEAD CODE STROKE WO CONTRAST  Result Date: 11/08/2022 CLINICAL DATA:  Code stroke. Initial evaluation for neuro deficit, stroke. EXAM: CT HEAD WITHOUT CONTRAST TECHNIQUE: Contiguous axial images were obtained from the base of the skull through the vertex without intravenous contrast. RADIATION DOSE REDUCTION: This exam was performed according to the departmental dose-optimization program which includes automated exposure control, adjustment of the mA and/or kV according to patient size and/or use of iterative reconstruction technique. COMPARISON:  None Available. FINDINGS: Brain: Cerebral volume within normal limits. No acute intracranial hemorrhage. No acute large vessel territory infarct. No mass lesion, mass effect or midline shift. No hydrocephalus or extra-axial fluid collection. Vascular: No appreciable abnormal hyperdense vessel. Scattered vascular calcifications noted within the carotid siphons. Skull: Scalp soft tissues and calvarium demonstrate no acute finding. Sinuses/Orbits: Globes orbital soft tissues within normal limits. Chronic right frontoethmoidal sinusitis noted. Mild mucosal thickening noted about the ethmoidal air cells as well. Mastoid air cells are clear. Other: None. ASPECTS [facility] Stroke Program Early CT Score) - Ganglionic level infarction (caudate, lentiform nuclei, internal capsule, insula, M1-M3 cortex): 7 - Supraganglionic infarction (M4-M6 cortex): 3 Total score (0-10 with 10 being normal): 10 IMPRESSION: 1. No acute  intracranial abnormality. 2. ASPECTS is 10. 3. Chronic right frontoethmoidal sinusitis. These results were communicated to Dr. Otelia Limes at 11:06 pm on 11/08/2022 by text page via the [facility] messaging system. Electronically Signed   By: Rise Mu M.D.   On: 11/08/2022 23:07    PHYSICAL EXAM  Temp:  [98.2 F (36.8 C)-98.5 F (36.9 C)] 98.2 F (36.8 C) (05/18 0800) Pulse Rate:  [65-90] 66 (05/18 1300) Resp:  [11-32] 17 (05/18 1300) BP: (120-156)/(69-110) 133/71 (05/18 1300) SpO2:  [92 %-100 %] 97 % (05/18 1300) Weight:  [111.7 kg-117.2 kg] 111.7 kg (05/18 0510)  General - Well nourished, well developed, in no apparent distress. Cardiovascular -questionable A-fib on monitor  Mental Status -  Level of arousal and orientation to time, place, and person were intact. Language including expression, naming, repetition, comprehension was assessed and found intact. Attention span and concentration were normal. Recent and remote memory were intact. Fund of Knowledge was assessed and was intact.  Cranial Nerves II - XII - II - Visual field intact OU. III, IV, VI - Extraocular movements intact. V - Facial sensation intact bilaterally. VII - Facial movement intact bilaterally. VIII - Hearing & vestibular intact bilaterally. X - Palate elevates symmetrically. XI - Chin turning & shoulder shrug intact bilaterally. XII - Tongue protrusion intact.  Motor Strength -right arm 5/5, left arm 4+/5 with slight drift, bilateral lower extremities 5/5 Motor Tone - Muscle tone was assessed at the neck and appendages and was normal.  Sensory - Light touch, temperature/pinprick were assessed and were symmetrical.    Coordination -mild ataxia left arm  Gait and Station - deferred.  ASSESSMENT/PLAN Keith Leblanc is a 57 y.o. male with history of  DM, ED, gout and HTN who presents from home via EMS as a Code Stroke after acute onset of slurred speech and left sided weakness. LKN was 2148. On  arrival his weakness was resolved but he still had residual mild speech hesitancy. He also was complaining of left sided numbness and tingling with EMS along with shuffling gait with dragging of his left foot on EMS initial assessment. The patient also complained of his left arm feeling it was "not my own".   Stroke: Small acute right MCA ischemic infarct Etiology: Likely cardioembolic Code Stroke  CT head No acute abnormality.  ASPECTS 10.    CTA head & neck occlusion of right distal M3.  Irregularity with severe stenosis in proximal right M2 MRI  small acute right MCA infarct 24 hours CT head scheduled for 1:00 in the morning 5/19 [place] BLE-ordered 2D Echo ordered Hypercoagulable panel sent LDL 96 HgbA1c 5.6 VTE prophylaxis -SCDs    Diet   Diet regular Room service appropriate? Yes; Fluid consistency: Thin   No antithrombotics prior to admission, now on no antithrombotics.  Will wait for 24-hour brain imaging to ensure no hemorrhage and will aspirin Therapy recommendations: Pending Disposition: Pending  Anxiety depression Continue Wellbutrin  Hypertension Home meds: Amlodipine 10 mg, bisoprolol 10 mg Stable BP goal<180/105 As needed labetalol and hydralazine to maintain blood pressure goal Cleviprex if needed Long-term BP goal normotensive  Hyperlipidemia Home meds: None LDL 96, goal < 70 Add Crestor 20 mg Continue statin at discharge  Questionable new onset A-fib Continue telemetry EKG with sinus rhythm with first-degree AV block and PACs Will consult cardiology for their assistance  Diabetes type II Controlled Home meds: Trulicity HgbA1c 5.6, goal < 7.0 CBGs Recent Labs    11/09/22 0743 11/09/22 1201  GLUCAP 85 88    SSI  Hypokalemia K2.8 Replace and check this afternoon in the morning  Hypomagnesemia Mg 1.3 Replace and check this afternoon and in the morning  EtOH abuse Marijuana use CIWA protocol UDS positive for THC Thiamine folate and  multivitamin Patient is willing to quit and looking for resources Will consult TOC for assistance  Other Stroke Risk Factors  ETOH use, alcohol level <10, advised to drink no more than 2 drink(s) a day Substance abuse - UDS:  THC POSITIVE, Cocaine NONE DETECTED. Patient advised to stop using due to stroke risk.  Other Active Problems Gout GERD CKD Anemia Hgb 8.9 will monitor and check in the morning  Hospital day # 0  Gevena Mart DNP, ACNPC-AG  [facility]  ATTENDING ATTESTATION:  Acute right MCA stroke status post TNK.  The question of possible atrial fibrillation.  Cardiology consulted.  They are not convinced he is atrial fibrillation likely PACs.  Echo pending.  Hypercoagulable workup.  Potassium being replaced.  Started on diet.  History of alcohol abuse will monitor for now may need CIWA protocol.  Continue post TNK ICU management.  Appreciate cardiology assistance.  Dr. Viviann Spare evaluated pt independently, reviewed imaging, chart, labs. Discussed and formulated plan with the Resident/APP. Changes were made to the note where appropriate. Please see APP/resident note above for details.     This patient is critically ill due to stroke s/p tPA and at significant risk of neurological worsening, death form heart failure,  respiratory failure, recurrent stroke, bleeding from [facility], seizure, sepsis. This patient's care requires constant monitoring of vital signs, hemodynamics, respiratory and cardiac monitoring, review of multiple databases, neurological assessment, discussion with family, other specialists and medical decision making of high complexity. I spent 35 minutes of neurocritical care time in the care of this patient.   Keith Crays,MD   To contact Stroke Continuity provider, please refer to [email or web address]. After hours, contact General Neurology

## 2022-11-09 NOTE — ED Notes (Signed)
Pt's significant other reports to staff that pt has new onset of left sided facial droop with drooling. Provider aware and at bedside

## 2022-11-09 NOTE — Progress Notes (Deleted)
Pt febrile majority of HS shift. Interventions implemented (see MAR and flowsheet), initially with improvement in temp. Pt then spiked another temp not affected by previous interventions. Traum, MD a and Ortho, PA  updated during morning rounds. See new orders/MD notes. 

## 2022-11-09 NOTE — ED Notes (Signed)
Returned from CT.

## 2022-11-09 NOTE — Progress Notes (Signed)
VASCULAR LAB    Bilateral lower extremity venous duplex has been performed.  See CV proc for preliminary results.   Rohen Kimes, RVT 11/09/2022, 5:14 PM

## 2022-11-09 NOTE — Evaluation (Signed)
Physical Therapy Evaluation Patient Details Name: Genie Scheler MRN: [id number] DOB: Nov 14, 1965 Today's Date: 11/09/2022  History of Present Illness  57 y.o. male presents to [facility] hospital on 11/08/2022 with acute onset slurred speech and L weakness. MRI consistent with R MCA infarct. PMH includes DM, gout, HTN.  Clinical Impression  Pt presents to PT without significant deficits at this time. Pt reports resolution of L numbness. Pt is mobilizing independently at this time, tolerating multiple dynamic gait challenges. PT encourages the pt to mobilize frequently during admission. Pt demonstrates no further PT needs at this time. Acute PT signing off.       Recommendations for follow up therapy are one component of a multi-disciplinary discharge planning process, led by the attending physician.  Recommendations may be updated based on patient status, additional functional criteria and insurance authorization.  Follow Up Recommendations       Assistance Recommended at Discharge None  Patient can return home with the following       Equipment Recommendations None recommended by PT  Recommendations for Other Services       Functional Status Assessment Patient has not had a recent decline in their functional status     Precautions / Restrictions Precautions Precautions: Fall Restrictions Weight Bearing Restrictions: No      Mobility  Bed Mobility Overal bed mobility: Independent                  Transfers Overall transfer level: Independent Equipment used: None Transfers: Sit to/from Stand Sit to Stand: Independent                Ambulation/Gait Ambulation/Gait assistance: Independent Gait Distance (Feet): 400 Feet Assistive device: None Gait Pattern/deviations: WFL(Within Functional Limits) Gait velocity: functional Gait velocity interpretation: >2.62 ft/sec, indicative of community ambulatory   General Gait Details: steady step-through gait, tolerates  dynamic gait challenges including backward walkign, changes in gait speed and stride length, head turns, all without gait or balance deviations  Stairs            Wheelchair Mobility    Modified Rankin (Stroke Patients Only) Modified Rankin (Stroke Patients Only) Pre-Morbid Rankin Score: No symptoms Modified Rankin: No symptoms     Balance Overall balance assessment: Independent (rhomber eyes closed 30 seconds. 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[employer], works from home       Higher education careers adviser   Dominant Hand: Right    Extremity/Trunk Assessment   Upper Extremity Assessment Upper Extremity Assessment: Defer to OT evaluation LUE Deficits / Details: mild weakness to shoulder flexion    Lower Extremity Assessment Lower Extremity Assessment: Overall WFL for tasks assessed    Cervical / Trunk Assessment Cervical / Trunk Assessment: Normal  Communication   Communication: No difficulties  Cognition Arousal/Alertness: Awake/alert Behavior During Therapy: WFL for tasks assessed/performed Overall Cognitive Status: Within Functional Limits for tasks assessed                                          General Comments General comments (skin integrity, edema, etc.): VSS on RA    Exercises     Assessment/Plan    PT Assessment Patient does not need any further PT services  PT  Problem List         PT Treatment Interventions      PT Goals (Current goals can be found in the Care Plan section)       Frequency       Co-evaluation               AM-PAC PT "6 Clicks" Mobility  Outcome Measure Help needed turning from your back to your side while in a flat bed without using bedrails?: None Help needed moving from lying on your back to sitting on the side of a flat bed without using bedrails?: None Help needed moving to and from a bed to a chair (including a wheelchair)?: None Help needed standing up from a chair using your arms (e.g., wheelchair or bedside chair)?: None Help needed to walk in hospital room?: None Help needed climbing 3-5 steps with a railing? : None 6 Click Score: 24    End of Session Equipment Utilized During Treatment: Gait belt Activity Tolerance: Patient tolerated treatment well Patient left: in bed;with call bell/phone within reach Nurse Communication: Mobility status PT Visit Diagnosis: Other symptoms and signs involving the nervous system (R29.898)    Time: 1610-9604 PT Time Calculation (min) (ACUTE ONLY): 10 min   Charges:   PT Evaluation $PT Eval Low Complexity: 1 Low          Arlyss Gandy, PT, DPT Acute Rehabilitation Office [phone]   Arlyss Gandy 11/09/2022, 3:20 PM

## 2022-11-09 NOTE — Consult Note (Signed)
Cardiology Consultation   Patient ID: Keith Leblanc MRN: [id number]; DOB: 06/07/1966  Admit date: 11/08/2022 Date of Consult: 11/09/2022  PCP:  Shirline Frees, NP   [facility] Providers Cardiologist:  None   {  Patient Profile:   Keith Leblanc is a 57 y.o. male with a history of hypertension, type 2 diabetes mellitus, and gout who is being seen 11/09/2022 for the evaluation of atrial fibrillation at the request of Dr. Otelia Limes.  History of Present Illness:   Keith Leblanc is a 57 year old male with the above history. No documented cardiac history. He was admitted on 11/08/2022 for an acute stroke after presenting with sudden onset of slurred speech and left sided weakness. Head CT showed no acute intracranial abnormality. Brain MRI showed a subtle 2.3cm focus of restricted diffusion involving the posterior right corona radiata/ external capsule, consistent with a small acute right MCA territory infarct as well as a small focus of susceptibility artifact at the level of the right Sylvian fissure suspected to reflect intraluminal thrombus within a distal right MCA Keith Leblanc. Echo is pending. Cardiology was consulted for atrial fibrillation. EKGs from this admission were reviewed and show normal sinus rhythm with 1st degree AV block and frequent PACs. However, there is some possible atrial fibrillation noted on telemetry (no consistent P wave at times). Cardiology was consulted for further evaluation.  He is asymptomatic from a rhythm standpoint.   Past Medical History:  Diagnosis Date   Blood in stool    bright red blood    Chicken pox    Diabetes mellitus (HCC)    Erectile dysfunction    Gout    Hypertension     Past Surgical History:  Procedure Laterality Date   APPENDECTOMY  2004   COLONOSCOPY     TOOTH EXTRACTION  07/06/2020     Home Medications:  Prior to Admission medications   Medication Sig Start Date End Date Taking? Authorizing Provider  amLODipine  (NORVASC) 10 MG tablet TAKE 1 TABLET BY MOUTH DAILY 05/23/22  Yes Nafziger, Kandee Keen, NP  bisoprolol (ZEBETA) 10 MG tablet TAKE 2 TABLETS BY MOUTH DAILY 01/16/22  Yes Nafziger, Kandee Keen, NP  buPROPion (WELLBUTRIN XL) 300 MG 24 hr tablet TAKE 1 TABLET BY MOUTH ONCE  DAILY Patient taking differently: Take 300 mg by mouth daily. 10/15/22  Yes Nafziger, Kandee Keen, NP  colchicine 0.6 MG tablet TAKE 1 TABLET BY MOUTH  DAILY. WHEN HAVING FLARES  TAKE 1 TABLET BY MOUTH  TWICE DAILY Patient taking differently: Take 0.6 mg by mouth daily. 11/23/20  Yes Deveshwar, Janalyn Rouse, MD  Dulaglutide (TRULICITY) 0.75 MG/0.5ML SOPN Inject 0.75 mg in am weekly under skin 07/10/21  Yes Carlus Pavlov, MD  LANTUS SOLOSTAR 100 UNIT/ML Solostar Pen INJECT SUBCUTANEOUSLY 10 TO 12  UNITS DAILY Patient taking differently: Inject 30 Units into the skin daily. 07/30/22  Yes Carlus Pavlov, MD  omeprazole (PRILOSEC) 40 MG capsule Take 1 capsule (40 mg total) by mouth daily. 01/28/22  Yes Nafziger, Kandee Keen, NP  sildenafil (REVATIO) 20 MG tablet TAKE 2-5 TABLETS BY MOUTH AS NEEDED FOR SEXUAL ACTIVITY Patient taking differently: Take 20 mg by mouth daily as needed (For ED). 08/23/22  Yes Nafziger, Kandee Keen, NP  allopurinol (ZYLOPRIM) 300 MG tablet TAKE 1 TABLET BY MOUTH DAILY Patient not taking: Reported on 11/09/2022 04/16/22   Shirline Frees, NP  blood glucose meter kit and supplies Dispense based on patient and insurance preference. Use up to four times daily as directed. (FOR ICD-9 250.00, 250.01).  07/09/16   Rodolph Bong, MD  Dulaglutide (TRULICITY) 0.75 MG/0.5ML SOPN Inject 0.75 mg into the skin once a week. Patient not taking: Reported on 11/09/2022 07/04/22   Carlus Pavlov, MD  fenofibrate (TRICOR) 145 MG tablet Take 1 tablet (145 mg total) by mouth daily. Patient not taking: Reported on 11/09/2022 09/13/21   Shirline Frees, NP  Insulin Pen Needle (B-D UF III MINI PEN NEEDLES) 31G X 5 MM MISC USE 4 TIMES DAILY AFTER MEALS AND AT BEDTIME 02/24/20    Carlus Pavlov, MD  Lancets [facility] ULTRASOFT) lancets Test blood sugars as directed. Dx E11.9 08/06/16   Shirline Frees, NP  ONE TOUCH ULTRA TEST test strip USE UP TO 4 TIMES DAILY AS DIRECTED 08/30/16   Nelwyn Salisbury, MD    Inpatient Medications: Scheduled Meds:  [START ON 11/10/2022]  stroke: early stages of recovery book   Does not apply Once   amLODipine  10 mg Oral Daily   bisoprolol  20 mg Oral Daily   buPROPion  300 mg Oral Daily   Chlorhexidine Gluconate Cloth  6 each Topical q morning   colchicine  0.6 mg Oral Daily   [START ON 11/10/2022] enoxaparin (LOVENOX) injection  40 mg Subcutaneous Daily   folic acid  1 mg Oral Daily   insulin aspart  0-15 Units Subcutaneous TID WC   multivitamin with minerals  1 tablet Oral Daily   pantoprazole (PROTONIX) IV  40 mg Intravenous QHS   thiamine  100 mg Oral Daily   Continuous Infusions:  sodium chloride 75 mL/hr at 11/09/22 1300   clevidipine     PRN Meds: acetaminophen **OR** acetaminophen (TYLENOL) oral liquid 160 mg/5 mL **OR** acetaminophen, hydrALAZINE, labetalol, senna-docusate  Allergies:   No Known Allergies  Social History:   Social History   Socioeconomic History   Marital status: Single    Spouse name: Not on file   Number of children: Not on file   Years of education: Not on file   Highest education level: Not on file  Occupational History   Not on file  Tobacco Use   Smoking status: Former   Smokeless tobacco: Never  Vaping Use   Vaping Use: Never used  Substance and Sexual Activity   Alcohol use: Yes    Comment: daily    Drug use: Not Currently   Sexual activity: Yes  Other Topics Concern   Not on file  Social History Narrative   Works for [employer]    Not married    One daughter who does not live with him    Likes to gamble, walk in the parks, travel.    Social Determinants of Health   Financial Resource Strain: Not on file  Food Insecurity: Not on file  Transportation Needs: Not on file   Physical Activity: Not on file  Stress: Not on file  Social Connections: Not on file  Intimate Partner Violence: Not on file    Family History:   Family History  Problem Relation Age of Onset   Alcohol abuse Father    Hypertension Father    Heart disease Father    Gout Father    Breast cancer Mother    Hypertension Mother    Gout Mother    Healthy Sister    Healthy Daughter    Colon cancer Neg Hx    Esophageal cancer Neg Hx    Rectal cancer Neg Hx    Stomach cancer Neg Hx      ROS:  Please see the history of present illness.  All other ROS reviewed and negative.     Physical Exam/Data:   Vitals:   11/09/22 1130 11/09/22 1200 11/09/22 1230 11/09/22 1300  BP: (!) 140/86 (!) 144/69 136/78 133/71  Pulse: 66 71 67 66  Resp: 15 15 16 17   Temp:      TempSrc:      SpO2: 98% 96% 98% 97%  Weight:      Height:        Intake/Output Summary (Last 24 hours) at 11/09/2022 1356 Last data filed at 11/09/2022 1300 Gross per 24 hour  Intake 648.22 ml  Output 725 ml  Net -76.78 ml      11/09/2022    5:10 AM 11/08/2022   11:17 PM 11/08/2022   10:52 PM  Last 3 Weights  Weight (lbs) 246 lb 4.1 oz 258 lb 6.1 oz 258 lb 6.1 oz  Weight (kg) 111.7 kg 117.2 kg 117.2 kg     Body mass index is 28.46 kg/m.  General:  Well nourished, well developed, in no acute distress HEENT: normal Neck: no JVD Vascular: No carotid bruits; Distal pulses 2+ bilaterally Cardiac:  normal S1, S2; RRR; no murmur  Lungs:  clear to auscultation bilaterally, no wheezing, rhonchi or rales  Abd: soft, nontender, no hepatomegaly  Ext: no edema Musculoskeletal:  no LE edema Skin: warm and dry  Neuro:  CNs 2-12 intact, no focal abnormalities noted Psych:  Normal affect   EKG:  The EKG was personally reviewed and demonstrates:  Normal sinus rhythm with 1st degree AV block and frequent PACs.  Telemetry:  Telemetry was personally reviewed and demonstrates:  sinus rhythm, PACs,   Relevant CV Studies:  Echo  pending.  Laboratory Data:  High Sensitivity Troponin:  No results for input(s): "TROPONINIHS" in the last 720 hours.   Chemistry Recent Labs  Lab 11/08/22 2251 11/08/22 2252 11/09/22 0045  NA 140 141 139  K 3.3* 3.3* 2.8*  CL 106 105 103  CO2 24  --  23  GLUCOSE 89 86 85  BUN 18 19 16   CREATININE 1.66* 1.80* 1.54*  CALCIUM 8.8*  --  8.9  MG  --   --  1.3*  GFRNONAA 49*  --  53*  ANIONGAP 10  --  13    Recent Labs  Lab 11/08/22 2251  PROT 6.6  ALBUMIN 3.6  AST 21  ALT 18  ALKPHOS 33*  BILITOT 0.7   Lipids  Recent Labs  Lab 11/09/22 0045  CHOL 199  TRIG 135  HDL 76  LDLCALC 96  CHOLHDL 2.6    Hematology Recent Labs  Lab 11/08/22 2251 11/08/22 2252 11/09/22 0045  WBC 6.6  --  8.3  RBC 3.62*  --  3.79*  HGB 8.3* 10.2* 8.9*  HCT 28.8* 30.0* 30.4*  MCV 79.6*  --  80.2  MCH 22.9*  --  23.5*  MCHC 28.8*  --  29.3*  RDW 16.3*  --  16.5*  PLT 350  --  376   Thyroid No results for input(s): "TSH", "FREET4" in the last 168 hours.  BNPNo results for input(s): "BNP", "PROBNP" in the last 168 hours.  DDimer No results for input(s): "DDIMER" in the last 168 hours.   Radiology/Studies:  CT ANGIO HEAD NECK W WO CM  Result Date: 11/09/2022 CLINICAL DATA:  Initial evaluation for neuro deficit, stroke. EXAM: CT ANGIOGRAPHY HEAD AND NECK WITH AND WITHOUT CONTRAST TECHNIQUE: Multidetector CT imaging of the head and  neck was performed using the standard protocol during bolus administration of intravenous contrast. Multiplanar CT image reconstructions and MIPs were obtained to evaluate the vascular anatomy. Carotid stenosis measurements (when applicable) are obtained utilizing NASCET criteria, using the distal internal carotid diameter as the denominator. RADIATION DOSE REDUCTION: This exam was performed according to the departmental dose-optimization program which includes automated exposure control, adjustment of the mA and/or kV according to patient size and/or use of  iterative reconstruction technique. CONTRAST:  75mL OMNIPAQUE IOHEXOL 350 MG/ML SOLN COMPARISON:  Prior CT and MRI from earlier the same evening. FINDINGS: CTA NECK FINDINGS Aortic arch: Visualized aortic arch normal caliber with normal 3 vessel morphology. Mild atheromatous change about the arch itself. No stenosis about the origin of the great vessels. Right carotid system: Right common and internal carotid arteries are patent without stenosis or dissection. Left carotid system: Left common and internal carotid arteries are patent without stenosis or dissection. Vertebral arteries: Both vertebral arteries arise from subclavian arteries. No proximal subclavian artery stenosis. Vertebral arteries are patent without stenosis or dissection. Skeleton: No discrete or worrisome osseous lesions. Mild for age spondylosis present at C5-6 and C6-7. Other neck: No other acute abnormality within the neck. Upper chest: Visualized upper chest demonstrates no acute finding. Review of the MIP images confirms the above findings CTA HEAD FINDINGS Anterior circulation: Is mild atheromatous change about the carotid siphons without hemodynamically significant stenosis. A1 segments patent bilaterally. Normal anterior communicating complex. Anterior cerebral arteries patent without stenosis. No M1 stenosis or occlusion. Distal left MCA branches are patent and well perfused. On the right, there is irregularity with probable severe stenosis involving a proximal right M2 Kate Larock just distal to the bifurcation (series 7, image 130), inferior division. Additionally, there is occlusion of a distal right M3 Amylia Collazos, superior division (series 7, image 108). This corresponds with susceptibility artifact seen on prior brain MRI, consistent with intraluminal thrombus. The remainder of the right MCA branches remain patent and perfused. Posterior circulation: Both V4 segments patent without stenosis. Both PICA grossly patent at their origins. Basilar  patent without stenosis. Superior cerebral arteries patent bilaterally. Both PCAs primarily supplied via the basilar well perfused or distal aspects. Venous sinuses: Patent allowing for timing the contrast bolus. Anatomic variants: None significant.  No aneurysm. Review of the MIP images confirms the above findings IMPRESSION: 1. Occlusion of a distal right M3 Sebastain Fishbaugh, superior division. This corresponds with susceptibility artifact seen on prior MRI, and is consistent with intraluminal thrombus. Finding is in keeping with the previously identified small right MCA territory infarct. 2. Additional irregularity with severe stenosis involving a proximal right M2 Ether Wolters, inferior division. The remainder of the right MCA branches remain patent and perfused. 3. Mild atheromatous change about the carotid siphons without hemodynamically significant stenosis. Case discussed by telephone around the time of image acquisition 11/09/2022 at approximately 1:20 a.m. with provider ERIC LINDZEN. Electronically Signed   By: Rise Mu M.D.   On: 11/09/2022 02:20   MR BRAIN WO CONTRAST  Result Date: 11/09/2022 CLINICAL DATA:  Initial evaluation for neuro deficit, stroke. EXAM: MRI HEAD WITHOUT CONTRAST TECHNIQUE: Multiplanar, multiecho pulse sequences of the brain and surrounding structures were obtained without intravenous contrast. COMPARISON:  Prior study from 11/08/2022. FINDINGS: Brain: Cerebral volume within normal limits. No significant cerebral white matter disease for age. Subtle small focus of restricted diffusion is seen involving the posterior right corona radiata, extending towards the external capsule (series 2, image 32). This measures approximately 2.3 cm in  length. Associated signal loss on ADC map (series 250, image 32). Finding suspicious for a tiny acute right MCA territory infarct. No associated hemorrhage or mass effect. No other evidence for acute or subacute ischemia. Gray-white matter  differentiation otherwise maintained. No areas of chronic cortical infarction. No acute or chronic intracranial blood products. No mass lesion, midline shift or mass effect no hydrocephalus or extra-axial fluid collection. Pituitary gland suprasellar region within normal limits. Vascular: Small focus of susceptibility artifact at the level of the right sylvian fissure, suspected to reflect small volume intraluminal thrombus within a distal right MCA Karielle Davidow (series 8, image 59). Major intracranial vascular flow voids are otherwise maintained. Skull and upper cervical spine: Craniocervical junction within normal limits. Bone marrow signal intensity normal. No scalp soft tissue abnormality. Sinuses/Orbits: Globes orbital soft tissues within normal limits. Chronic right frontoethmoidal sinus disease. Mild mucosal thickening noted about the maxillary sinuses. Superimposed small right maxillary sinus retention cyst. No mastoid effusion. Other: None. IMPRESSION: 1. Subtle 2.3 cm focus of restricted diffusion involving the posterior right corona radiata/external capsule, consistent with a small acute right MCA territory infarct. No associated hemorrhage or mass effect. 2. Small focus of susceptibility artifact at the level of the right Sylvian fissure, suspected to reflect intraluminal thrombus within a distal right MCA Ritika Hellickson. 3. Otherwise normal brain MRI for age. 4. Chronic right frontoethmoidal sinus disease. Electronically Signed   By: Rise Mu M.D.   On: 11/09/2022 01:22   CT HEAD CODE STROKE WO CONTRAST  Result Date: 11/08/2022 CLINICAL DATA:  Code stroke. Initial evaluation for neuro deficit, stroke. EXAM: CT HEAD WITHOUT CONTRAST TECHNIQUE: Contiguous axial images were obtained from the base of the skull through the vertex without intravenous contrast. RADIATION DOSE REDUCTION: This exam was performed according to the departmental dose-optimization program which includes automated exposure control,  adjustment of the mA and/or kV according to patient size and/or use of iterative reconstruction technique. COMPARISON:  None Available. FINDINGS: Brain: Cerebral volume within normal limits. No acute intracranial hemorrhage. No acute large vessel territory infarct. No mass lesion, mass effect or midline shift. No hydrocephalus or extra-axial fluid collection. Vascular: No appreciable abnormal hyperdense vessel. Scattered vascular calcifications noted within the carotid siphons. Skull: Scalp soft tissues and calvarium demonstrate no acute finding. Sinuses/Orbits: Globes orbital soft tissues within normal limits. Chronic right frontoethmoidal sinusitis noted. Mild mucosal thickening noted about the ethmoidal air cells as well. Mastoid air cells are clear. Other: None. ASPECTS [facility] Stroke Program Early CT Score) - Ganglionic level infarction (caudate, lentiform nuclei, internal capsule, insula, M1-M3 cortex): 7 - Supraganglionic infarction (M4-M6 cortex): 3 Total score (0-10 with 10 being normal): 10 IMPRESSION: 1. No acute intracranial abnormality. 2. ASPECTS is 10. 3. Chronic right frontoethmoidal sinusitis. These results were communicated to Dr. Otelia Limes at 11:06 pm on 11/08/2022 by text page via the [facility] messaging system. Electronically Signed   By: Rise Mu M.D.   On: 11/08/2022 23:07     Assessment and Plan:   Sinus with PACs, 1st degree AV block Patient was admitted with an acute stroke and there was concern for atrial fibrillation on EKG and telemetry. EKGs this admission show normal sinus rhythm with 1st degree AV block and frequent PACs --> tele has poor baseline at times, but appears to be sinus with PACs.  Unlikely to be going in and out of afib. Will continue to monitor. No changes for now. - Echo pending.  - Potassium 2.8 today. Being repleted by primary team. - Will  check Magnesium and TSH. - Continue home Bisoprolol 20mg  daily.   Otherwise, per primary team: - Acute  stroke - Hypertension - Type 2 diabetes mellitus - Hypokalemia   Risk Assessment/Risk Scores:     For questions or updates, please contact [facility] Please consult [email or web address] for contact info under    Maisie Fus, MD

## 2022-11-09 NOTE — ED Notes (Signed)
Pt's facial droop has improved some and no longer drooling.

## 2022-11-09 NOTE — Progress Notes (Signed)
Pt noted to have Afib on the room monitor. Pt's EKG collected. Pt denies a h/o Afib, pt asymptomatic. DR. Otelia Limes paged and Dr. Blake Divine called, but w/o an answer or return back at this time. This information will be passed on with handoff report.

## 2022-11-09 NOTE — Evaluation (Signed)
Occupational Therapy Evaluation Patient Details Name: Keith Leblanc MRN: [id number] DOB: 1965-11-13 Today's Date: 11/09/2022   History of Present Illness 57 y.o. male presents to [facility] hospital on 11/08/2022 with acute onset slurred speech and L weakness. MRI consistent with R MCA infarct. PMH includes DM, gout, HTN.   Clinical Impression   Patient admitted for the diagnosis above.  PTA he lives at home alone, but his SO will be staying with him for a few days at discharge.  Patient presented very close to his baseline for ADL completion at sit to stand level, and in room mobility/toileting, needing generalized supervision.  OT can follow in the acute setting to address deficits and ensure a safe transition home, but no post acute OT is anticipated.        Recommendations for follow up therapy are one component of a multi-disciplinary discharge planning process, led by the attending physician.  Recommendations may be updated based on patient status, additional functional criteria and insurance authorization.   Assistance Recommended at Discharge Set up Supervision/Assistance  Patient can return home with the following Assist for transportation    Functional Status Assessment  Patient has had a recent decline in their functional status and demonstrates the ability to make significant improvements in function in a reasonable and predictable amount of time.  Equipment Recommendations  None recommended by OT    Recommendations for Other Services       Precautions / Restrictions Precautions Precautions: Fall Restrictions Weight Bearing Restrictions: No      Mobility Bed Mobility Overal bed mobility: Independent                  Transfers Overall transfer level: Needs assistance Equipment used: None Transfers: Sit to/from Stand, Bed to chair/wheelchair/BSC Sit to Stand: Independent     Step pivot transfers: Supervision            Balance Overall balance assessment:  Mild deficits observed, not formally tested                                         ADL either performed or assessed with clinical judgement   ADL       Grooming: Wash/dry hands;Supervision/safety;Standing               Lower Body Dressing: Supervision/safety;Sit to/from stand   Toilet Transfer: Supervision/safety;Ambulation                   Vision Patient Visual Report: No change from baseline       Perception     Praxis      Pertinent Vitals/Pain Pain Assessment Pain Assessment: No/denies pain     Hand Dominance Right   Extremity/Trunk Assessment Upper Extremity Assessment Upper Extremity Assessment: Overall WFL for tasks assessed;LUE deficits/detail LUE Deficits / Details: mild weakness to shoulder flexion   Lower Extremity Assessment Lower Extremity Assessment: Defer to PT evaluation   Cervical / Trunk Assessment Cervical / Trunk Assessment: Normal   Communication Communication Communication: No difficulties   Cognition Arousal/Alertness: Awake/alert Behavior During Therapy: WFL for tasks assessed/performed Overall Cognitive Status: Within Functional Limits for tasks assessed                                       General Comments   VSS on RA  Exercises     Shoulder Instructions      Home Living Family/patient expects to be discharged to:: Private residence Living Arrangements: Alone Available Help at Discharge: Friend(s) Type of Home: House Home Access: Level entry     Home Layout: One level     Bathroom Shower/Tub: Chief Strategy Officer: Standard Bathroom Accessibility: No   Home Equipment: None          Prior Functioning/Environment Prior Level of Function : Independent/Modified Independent;Working/employed;Driving                        OT Problem List: Impaired balance (sitting and/or standing)      OT Treatment/Interventions: Self-care/ADL  training;Therapeutic activities;Balance training    OT Goals(Current goals can be found in the care plan section) Acute Rehab OT Goals Patient Stated Goal: Return home OT Goal Formulation: With patient Time For Goal Achievement: 11/22/22 Potential to Achieve Goals: Good ADL Goals Pt Will Perform Grooming: Independently;standing Pt Will Perform Lower Body Dressing: Independently;sit to/from stand Pt Will Transfer to Toilet: Independently;regular height toilet;ambulating  OT Frequency: Min 2X/week    Co-evaluation              AM-PAC OT "6 Clicks" Daily Activity     Outcome Measure Help from another person eating meals?: None Help from another person taking care of personal grooming?: A Little Help from another person toileting, which includes using toliet, bedpan, or urinal?: A Little Help from another person bathing (including washing, rinsing, drying)?: A Little Help from another person to put on and taking off regular upper body clothing?: None Help from another person to put on and taking off regular lower body clothing?: A Little 6 Click Score: 20   End of Session Equipment Utilized During Treatment: Gait belt Nurse Communication: Mobility status  Activity Tolerance: Patient tolerated treatment well Patient left: in chair;with call bell/phone within reach  OT Visit Diagnosis: Unsteadiness on feet (R26.81)                Time: 1410-1430 OT Time Calculation (min): 20 min Charges:  OT General Charges $OT Visit: 1 Visit OT Evaluation $OT Eval Moderate Complexity: 1 Mod  11/09/2022  RP, OTR/L  Acute Rehabilitation Services  Office:  [phone]   Keith Leblanc 11/09/2022, 2:35 PM

## 2022-11-09 NOTE — Progress Notes (Signed)
Per DR. Lindzen "get another EKG", order placed. On-coming RN aware of the order and the need for a EKG. No verbal order for STAT EKG.

## 2022-11-09 NOTE — ED Notes (Signed)
Pt taken to CT.

## 2022-11-09 NOTE — Progress Notes (Signed)
PHARMACIST CODE STROKE RESPONSE  Notified to mix TNK at 0101 by Soundra Pilon, RN TNK preparation completed at 0101  TNK dose = 25 mg IV over 5 seconds  Issues/delays encountered (if applicable): Pharmacy was in main pharmacy (not bedside) as this was decision made to give TNK quite delayed after initial presentation  Christoper Fabian, PharmD, BCPS Please see amion for complete clinical pharmacist phone list 11/09/22 1:13 AM

## 2022-11-09 NOTE — ED Notes (Signed)
Patient's family notified me of facial asymmetry. Primary RN with another patient so I assessed patient, finding him drooling on left side of face, with notable droop on left side, and when tongue stuck out, it skewed left. Primary RN and provider notified.

## 2022-11-10 ENCOUNTER — Inpatient Hospital Stay (HOSPITAL_COMMUNITY): Payer: BC Managed Care – PPO

## 2022-11-10 ENCOUNTER — Other Ambulatory Visit: Payer: Self-pay | Admitting: Student

## 2022-11-10 DIAGNOSIS — I639 Cerebral infarction, unspecified: Secondary | ICD-10-CM | POA: Diagnosis not present

## 2022-11-10 DIAGNOSIS — R4781 Slurred speech: Secondary | ICD-10-CM | POA: Diagnosis not present

## 2022-11-10 LAB — PHOSPHORUS: Phosphorus: 3.8 mg/dL (ref 2.5–4.6)

## 2022-11-10 LAB — CBC
HCT: 27.7 % — ABNORMAL LOW (ref 39.0–52.0)
Hemoglobin: 8.4 g/dL — ABNORMAL LOW (ref 13.0–17.0)
MCH: 23.9 pg — ABNORMAL LOW (ref 26.0–34.0)
MCHC: 30.3 g/dL (ref 30.0–36.0)
MCV: 78.7 fL — ABNORMAL LOW (ref 80.0–100.0)
Platelets: 298 10*3/uL (ref 150–400)
RBC: 3.52 MIL/uL — ABNORMAL LOW (ref 4.22–5.81)
RDW: 16.8 % — ABNORMAL HIGH (ref 11.5–15.5)
WBC: 4.8 10*3/uL (ref 4.0–10.5)
nRBC: 0 % (ref 0.0–0.2)

## 2022-11-10 LAB — PROTEIN S ACTIVITY: Protein S Activity: 118 % (ref 63–140)

## 2022-11-10 LAB — BASIC METABOLIC PANEL
Anion gap: 10 (ref 5–15)
BUN: 11 mg/dL (ref 6–20)
CO2: 22 mmol/L (ref 22–32)
Calcium: 8.5 mg/dL — ABNORMAL LOW (ref 8.9–10.3)
Chloride: 105 mmol/L (ref 98–111)
Creatinine, Ser: 1.29 mg/dL — ABNORMAL HIGH (ref 0.61–1.24)
GFR, Estimated: >60 mL/min (ref >60)
Glucose, Bld: 80 mg/dL (ref 70–99)
Potassium: 3.3 mmol/L — ABNORMAL LOW (ref 3.5–5.1)
Sodium: 137 mmol/L (ref 135–145)

## 2022-11-10 LAB — PROTEIN C, TOTAL: Protein C, Total: 122 % (ref 60–150)

## 2022-11-10 LAB — MAGNESIUM: Magnesium: 1.7 mg/dL (ref 1.7–2.4)

## 2022-11-10 LAB — PROTEIN S, TOTAL: Protein S Ag, Total: 82 % (ref 60–150)

## 2022-11-10 LAB — GLUCOSE, CAPILLARY
Glucose-Capillary: 88 mg/dL (ref 70–99)
Glucose-Capillary: 100 mg/dL — ABNORMAL HIGH (ref 70–99)

## 2022-11-10 LAB — PROTEIN C ACTIVITY: Protein C Activity: 133 % (ref 73–180)

## 2022-11-10 MED ORDER — CLOPIDOGREL BISULFATE 75 MG PO TABS: 75.0000 mg | ORAL_TABLET | Freq: Every day | ORAL | 0 refills | Status: DC

## 2022-11-10 MED ORDER — POTASSIUM CHLORIDE 20 MEQ PO PACK
20.0000 meq | PACK | Freq: Two times a day (BID) | ORAL | Status: DC
  Administered 2022-11-10: 20 meq via ORAL
  Filled 2022-11-10: qty 1

## 2022-11-10 MED ORDER — CLOPIDOGREL BISULFATE 75 MG PO TABS
75.0000 mg | ORAL_TABLET | Freq: Every day | ORAL | Status: DC
  Administered 2022-11-10: 75 mg via ORAL
  Filled 2022-11-10: qty 1

## 2022-11-10 MED ORDER — VITAMIN B-1 100 MG PO TABS: 100.0000 mg | ORAL_TABLET | Freq: Every day | ORAL | 0 refills | Status: DC

## 2022-11-10 MED ORDER — PANTOPRAZOLE SODIUM 40 MG PO TBEC: 40.0000 mg | DELAYED_RELEASE_TABLET | Freq: Every day | ORAL | Status: DC

## 2022-11-10 MED ORDER — ASPIRIN 81 MG PO TBEC: 81.0000 mg | DELAYED_RELEASE_TABLET | Freq: Every day | ORAL | 12 refills | Status: DC

## 2022-11-10 MED ORDER — ADULT MULTIVITAMIN W/MINERALS CH: 1.0000 | ORAL_TABLET | Freq: Every day | ORAL | 0 refills | Status: DC

## 2022-11-10 MED ORDER — ASPIRIN 81 MG PO TBEC
81.0000 mg | DELAYED_RELEASE_TABLET | Freq: Every day | ORAL | Status: DC
  Administered 2022-11-10: 81 mg via ORAL
  Filled 2022-11-10: qty 1

## 2022-11-10 NOTE — Discharge Summary (Addendum)
Stroke Discharge Summary  Patient ID: Arthuro Noetzel   MRN: [id number]      DOB: Sep 07, 1965  Date of Admission: 11/08/2022 Date of Discharge: 11/10/2022  Attending Physician:  Stroke, Md, MD, Stroke MD Consultant(s):    cardiology  Patient's PCP:  Shirline Frees, NP  DISCHARGE DIAGNOSIS:  Principal Problem:   Slurred speech Active Problems:   Stroke (cerebrum) (HCC)  Allergies as of 11/10/2022   No Known Allergies      Medication List     TAKE these medications    allopurinol 300 MG tablet Commonly known as: ZYLOPRIM TAKE 1 TABLET BY MOUTH DAILY   amLODipine 10 MG tablet Commonly known as: NORVASC TAKE 1 TABLET BY MOUTH DAILY   aspirin EC 81 MG tablet Take 1 tablet (81 mg total) by mouth daily. Swallow whole. Start taking on: Nov 11, 2022   B-D UF III MINI PEN NEEDLES 31G X 5 MM Misc Generic drug: Insulin Pen Needle USE 4 TIMES DAILY AFTER MEALS AND AT BEDTIME   bisoprolol 10 MG tablet Commonly known as: ZEBETA TAKE 2 TABLETS BY MOUTH DAILY   blood glucose meter kit and supplies Dispense based on patient and insurance preference. Use up to four times daily as directed. (FOR ICD-9 250.00, 250.01).   buPROPion 300 MG 24 hr tablet Commonly known as: WELLBUTRIN XL TAKE 1 TABLET BY MOUTH ONCE  DAILY   clopidogrel 75 MG tablet Commonly known as: PLAVIX Take 1 tablet (75 mg total) by mouth daily. Start taking on: Nov 11, 2022   colchicine 0.6 MG tablet TAKE 1 TABLET BY MOUTH  DAILY. WHEN HAVING FLARES  TAKE 1 TABLET BY MOUTH  TWICE DAILY What changed: See the new instructions.   fenofibrate 145 MG tablet Commonly known as: Tricor Take 1 tablet (145 mg total) by mouth daily.   Lantus SoloStar 100 UNIT/ML Solostar Pen Generic drug: insulin glargine INJECT SUBCUTANEOUSLY 10 TO 12  UNITS DAILY What changed: See the new instructions.   multivitamin with minerals Tabs tablet Take 1 tablet by mouth daily. Start taking on: Nov 11, 2022   omeprazole 40 MG  capsule Commonly known as: PRILOSEC Take 1 capsule (40 mg total) by mouth daily.   ONE TOUCH ULTRA TEST test strip Generic drug: glucose blood USE UP TO 4 TIMES DAILY AS DIRECTED   onetouch ultrasoft lancets Test blood sugars as directed. Dx E11.9   sildenafil 20 MG tablet Commonly known as: REVATIO TAKE 2-5 TABLETS BY MOUTH AS NEEDED FOR SEXUAL ACTIVITY What changed: See the new instructions.   thiamine 100 MG tablet Commonly known as: Vitamin B-1 Take 1 tablet (100 mg total) by mouth daily. Start taking on: Nov 11, 2022   Trulicity 0.75 MG/0.5ML Sopn Generic drug: Dulaglutide Inject 0.75 mg in am weekly under skin What changed: Another medication with the same name was removed. Continue taking this medication, and follow the directions you see here.       LABORATORY STUDIES CBC    Component Value Date/Time   WBC 4.8 11/10/2022 0321   RBC 3.52 (L) 11/10/2022 0321   HGB 8.4 (L) 11/10/2022 0321   HGB 11.1 (L) 07/13/2020 1449   HCT 27.7 (L) 11/10/2022 0321   HCT 33.4 (L) 07/13/2020 1449   PLT 298 11/10/2022 0321   PLT 428 07/13/2020 1449   MCV 78.7 (L) 11/10/2022 0321   MCV 90 07/13/2020 1449   MCH 23.9 (L) 11/10/2022 0321   MCHC 30.3 11/10/2022 0321  RDW 16.8 (H) 11/10/2022 0321   RDW 14.2 07/13/2020 1449   LYMPHSABS 3.0 11/08/2022 2251   LYMPHSABS 2.7 07/13/2020 1449   MONOABS 0.6 11/08/2022 2251   EOSABS 0.1 11/08/2022 2251   EOSABS 0.1 07/13/2020 1449   BASOSABS 0.1 11/08/2022 2251   BASOSABS 0.1 07/13/2020 1449   CMP    Component Value Date/Time   NA 137 11/10/2022 0321   NA 143 07/13/2020 1449   K 3.3 (L) 11/10/2022 0321   CL 105 11/10/2022 0321   CO2 22 11/10/2022 0321   GLUCOSE 80 11/10/2022 0321   BUN 11 11/10/2022 0321   BUN 14 07/13/2020 1449   CREATININE 1.29 (H) 11/10/2022 0321   CREATININE 1.37 (H) 01/26/2020 0906   CALCIUM 8.5 (L) 11/10/2022 0321   PROT 6.6 11/08/2022 2251   PROT 7.7 07/13/2020 1449   ALBUMIN 3.6 11/08/2022 2251    ALBUMIN 4.6 07/13/2020 1449   AST 21 11/08/2022 2251   ALT 18 11/08/2022 2251   ALKPHOS 33 (L) 11/08/2022 2251   BILITOT 0.7 11/08/2022 2251   BILITOT 0.5 07/13/2020 1449   GFRNONAA >60 11/10/2022 0321   GFRNONAA 59 (L) 01/26/2020 0906   GFRAA 69 07/13/2020 1449   GFRAA 69 01/26/2020 0906   COAGS Lab Results  Component Value Date   INR 1.1 11/08/2022   Lipid Panel    Component Value Date/Time   CHOL 199 11/09/2022 0045   TRIG 135 11/09/2022 0045   HDL 76 11/09/2022 0045   CHOLHDL 2.6 11/09/2022 0045   VLDL 27 11/09/2022 0045   LDLCALC 96 11/09/2022 0045   HgbA1C  Lab Results  Component Value Date   HGBA1C 5.6 11/09/2022   Urinalysis    Component Value Date/Time   COLORURINE AMBER (A) 06/18/2018 1445   APPEARANCEUR CLOUDY (A) 06/18/2018 1445   LABSPEC 1.029 06/18/2018 1445   PHURINE 5.0 06/18/2018 1445   GLUCOSEU >=500 (A) 06/18/2018 1445   HGBUR NEGATIVE 06/18/2018 1445   BILIRUBINUR NEGATIVE 06/18/2018 1445   KETONESUR 20 (A) 06/18/2018 1445   PROTEINUR NEGATIVE 06/18/2018 1445   NITRITE NEGATIVE 06/18/2018 1445   LEUKOCYTESUR NEGATIVE 06/18/2018 1445   Urine Drug Screen     Component Value Date/Time   LABOPIA NONE DETECTED 11/09/2022 0901   COCAINSCRNUR NONE DETECTED 11/09/2022 0901   LABBENZ NONE DETECTED 11/09/2022 0901   AMPHETMU NONE DETECTED 11/09/2022 0901   THCU POSITIVE (A) 11/09/2022 0901   LABBARB NONE DETECTED 11/09/2022 0901    Alcohol Level    Component Value Date/Time   ETH <10 11/08/2022 2251     SIGNIFICANT DIAGNOSTIC STUDIES VAS [place] LOWER EXTREMITY VENOUS (DVT)  Result Date: 11/10/2022  Lower Venous DVT Study Patient Name:  LOCKE MANANSALA  Date of Exam:   11/09/2022 Medical Rec #: [id number]        Accession #:    [id number] Date of Birth: [birth year]        Patient Gender: M Patient Age:   57 years Exam Location:  [facility] Procedure:      VAS [place] LOWER EXTREMITY VENOUS (DVT) Referring Phys: Angelique Blonder WOLFE  --------------------------------------------------------------------------------  Indications: Stroke.  Comparison Study: No prior study on file Performing Technologist: Sherren Kerns RVS  Examination Guidelines: A complete evaluation includes B-mode imaging, spectral Doppler, color Doppler, and power Doppler as needed of all accessible portions of each vessel. Bilateral testing is considered an integral part of a complete examination. Limited examinations for reoccurring indications may be performed as noted. The reflux portion of the  exam is performed with the patient in reverse Trendelenburg.  +---------+---------------+---------+-----------+----------+--------------+ RIGHT    CompressibilityPhasicitySpontaneityPropertiesThrombus Aging +---------+---------------+---------+-----------+----------+--------------+ CFV      Full           Yes      Yes                                 +---------+---------------+---------+-----------+----------+--------------+ SFJ      Full                                                        +---------+---------------+---------+-----------+----------+--------------+ FV Prox  Full                                                        +---------+---------------+---------+-----------+----------+--------------+ FV Mid   Full           Yes      Yes                                 +---------+---------------+---------+-----------+----------+--------------+ FV DistalFull                                                        +---------+---------------+---------+-----------+----------+--------------+ PFV      Full                                                        +---------+---------------+---------+-----------+----------+--------------+ POP      Full           Yes      Yes                                 +---------+---------------+---------+-----------+----------+--------------+ PTV      Full                                                         +---------+---------------+---------+-----------+----------+--------------+ PERO     Full                                                        +---------+---------------+---------+-----------+----------+--------------+   +---------+---------------+---------+-----------+----------+--------------+ LEFT     CompressibilityPhasicitySpontaneityPropertiesThrombus Aging +---------+---------------+---------+-----------+----------+--------------+ CFV      Full           Yes      Yes                                 +---------+---------------+---------+-----------+----------+--------------+  SFJ      Full                                                        +---------+---------------+---------+-----------+----------+--------------+ FV Prox  Full                                                        +---------+---------------+---------+-----------+----------+--------------+ FV Mid   Full           Yes      Yes                                 +---------+---------------+---------+-----------+----------+--------------+ FV DistalFull                                                        +---------+---------------+---------+-----------+----------+--------------+ PFV      Full                                                        +---------+---------------+---------+-----------+----------+--------------+ POP      Full           Yes      Yes                                 +---------+---------------+---------+-----------+----------+--------------+ PTV      Full                                                        +---------+---------------+---------+-----------+----------+--------------+ PERO     Full                                                        +---------+---------------+---------+-----------+----------+--------------+     Summary: BILATERAL: - No evidence of deep vein thrombosis seen in the lower extremities,  bilaterally. -No evidence of popliteal cyst, bilaterally.   *See table(s) above for measurements and observations. Electronically signed by Gerarda Fraction on 11/10/2022 at 8:32:52 AM.    Final    CT HEAD WO CONTRAST ( )  Result Date: 11/10/2022 CLINICAL DATA:  Follow-up examination for stroke. EXAM: CT HEAD WITHOUT CONTRAST TECHNIQUE: Contiguous axial images were obtained from the base of the skull through the vertex without intravenous contrast. RADIATION DOSE REDUCTION: This exam was performed according to the departmental dose-optimization program which includes automated exposure control, adjustment of the mA and/or kV according to patient size  and/or use of iterative reconstruction technique. COMPARISON:  Prior studies from 11/09/2022. FINDINGS: Brain: Cerebral volume within normal limits. Previously identified small right MCA distribution infarct difficult to visualized by CT, but appears grossly stable. No acute intracranial hemorrhage. No other new large vessel territory infarct. No mass lesion, midline shift or mass effect. No hydrocephalus or extra-axial fluid collection. Vascular: No abnormal hyperdense vessel. Scattered vascular calcifications noted within the carotid siphons. Skull: Scalp soft tissues and calvarium demonstrate no new finding. Sinuses/Orbits: Globes and orbital soft tissues within normal limits. Chronic right frontoethmoidal sinus disease again noted. Right maxillary sinus retention cyst. Mastoid air cells remain clear. Other: None. IMPRESSION: 1. Grossly stable small right MCA distribution infarct, difficult to visualized by CT. No evidence for hemorrhagic transformation or other complication. 2. No other new acute intracranial abnormality. Electronically Signed   By: Rise Mu M.D.   On: 11/10/2022 02:51   ECHOCARDIOGRAM COMPLETE  Result Date: 11/09/2022    ECHOCARDIOGRAM REPORT   Patient Name:   HEMAL SPADA Date of Exam: 11/09/2022 Medical Rec #:  [id number]        Height:       78.0 in Accession #:    [id number]      Weight:       246.3 lb Date of Birth:  1965/10/20       BSA:          2.466 m Patient Age:    56 years        BP:           133/71 mmHg Patient Gender: M               HR:           63 bpm. Exam Location:  Inpatient Procedure: 2D Echo, 3D Echo, Cardiac Doppler, Color Doppler and Strain Analysis Indications:    Stroke  History:        Patient has prior history of Echocardiogram examinations, most                 recent 02/08/2004. Stroke; Risk Factors:Hypertension, Current                 Smoker and Diabetes.  Sonographer:    Sheralyn Boatman RDCS Referring Phys: [id number] ERIC LINDZEN IMPRESSIONS  1. Left ventricular ejection fraction, by estimation, is 60 to 65%. Left ventricular ejection fraction by 3D volume is 61 %. The left ventricle has normal function. The left ventricle has no regional wall motion abnormalities. There is moderate left ventricular hypertrophy. Left ventricular diastolic parameters are indeterminate. The average left ventricular global longitudinal strain is -20.7 %. The global longitudinal strain is normal.  2. Right ventricular systolic function is normal. The right ventricular size is normal. Tricuspid regurgitation signal is inadequate for assessing PA pressure.  3. The mitral valve is normal in structure. No evidence of mitral valve regurgitation.  4. The aortic valve is tricuspid. Aortic valve regurgitation is not visualized. No aortic stenosis is present.  5. Aortic dilatation noted. There is dilatation of the ascending aorta, measuring 40 mm.  6. The inferior vena cava is normal in size with greater than 50% respiratory variability, suggesting right atrial pressure of 3 mmHg. FINDINGS  Left Ventricle: Left ventricular ejection fraction, by estimation, is 60 to 65%. Left ventricular ejection fraction by 3D volume is 61 %. The left ventricle has normal function. The left ventricle has no regional wall motion abnormalities. The average left  ventricular global longitudinal strain is -  20.7 %. The global longitudinal strain is normal. The left ventricular internal cavity size was normal in size. There is moderate left ventricular hypertrophy. Left ventricular diastolic parameters are indeterminate. Right Ventricle: The right ventricular size is normal. No increase in right ventricular wall thickness. Right ventricular systolic function is normal. Tricuspid regurgitation signal is inadequate for assessing PA pressure. Left Atrium: Left atrial size was normal in size. Right Atrium: Right atrial size was normal in size. Pericardium: There is no evidence of pericardial effusion. Mitral Valve: The mitral valve is normal in structure. No evidence of mitral valve regurgitation. Tricuspid Valve: The tricuspid valve is normal in structure. Tricuspid valve regurgitation is trivial. Aortic Valve: The aortic valve is tricuspid. Aortic valve regurgitation is not visualized. No aortic stenosis is present. Pulmonic Valve: The pulmonic valve was not well visualized. Pulmonic valve regurgitation is not visualized. Aorta: The aortic root is normal in size and structure and aortic dilatation noted. There is dilatation of the ascending aorta, measuring 40 mm. Venous: The inferior vena cava is normal in size with greater than 50% respiratory variability, suggesting right atrial pressure of 3 mmHg. IAS/Shunts: The interatrial septum was not well visualized.  LEFT VENTRICLE PLAX 2D LVIDd:         4.75 cm         Diastology LVIDs:         3.00 cm         LV e' medial:    5.98 cm/s LV PW:         1.60 cm         LV E/e' medial:  16.3 LV IVS:        1.40 cm         LV e' lateral:   7.73 cm/s LVOT diam:     2.40 cm         LV E/e' lateral: 12.6 LV SV:         98 LV SV Index:   40              2D LVOT Area:     4.52 cm        Longitudinal                                Strain                                2D Strain GLS  -20.7 %                                Avg:                                  3D Volume EF                                LV 3D EF:    Left                                             ventricul  ar                                             ejection                                             fraction                                             by 3D                                             volume is                                             61 %.                                 3D Volume EF:                                3D EF:        61 %                                LV EDV:       183 ml                                LV ESV:       71 ml                                LV SV:        112 ml RIGHT VENTRICLE             IVC RV S prime:     10.30 cm/s  IVC diam: 2.00 cm TAPSE (M-mode): 2.0 cm LEFT ATRIUM             Index        RIGHT ATRIUM          Index LA diam:        3.80 cm 1.54 cm/m   RA Area:     8.78 cm LA Vol (A2C):   27.6 ml 11.19 ml/m  RA Volume:   16.10 ml 6.53 ml/m LA Vol (A4C):   40.8 ml 16.54 ml/m LA Biplane Vol: 33.1 ml 13.42 ml/m  AORTIC VALVE LVOT Vmax:   112.00 cm/s LVOT Vmean:  71.700 cm/s LVOT VTI:    0.217 m  AORTA Ao Root diam: 3.80 cm Ao Asc diam:  4.00 cm MITRAL VALVE MV Area (PHT): 3.17 cm    SHUNTS MV  Decel Time: 240 msec    Systemic VTI:  0.22 m MV E velocity: 97.45 cm/s  Systemic Diam: 2.40 cm MV A velocity: 64.70 cm/s MV E/A ratio:  1.51 Epifanio Lesches MD Electronically signed by Epifanio Lesches MD Signature Date/Time: 11/09/2022/3:47:28 PM    Final    CT ANGIO HEAD NECK W WO CM  Result Date: 11/09/2022 CLINICAL DATA:  Initial evaluation for neuro deficit, stroke. EXAM: CT ANGIOGRAPHY HEAD AND NECK WITH AND WITHOUT CONTRAST TECHNIQUE: Multidetector CT imaging of the head and neck was performed using the standard protocol during bolus administration of intravenous contrast. Multiplanar CT image reconstructions and MIPs were obtained to evaluate the vascular anatomy. Carotid  stenosis measurements (when applicable) are obtained utilizing NASCET criteria, using the distal internal carotid diameter as the denominator. RADIATION DOSE REDUCTION: This exam was performed according to the departmental dose-optimization program which includes automated exposure control, adjustment of the mA and/or kV according to patient size and/or use of iterative reconstruction technique. CONTRAST:  75mL OMNIPAQUE IOHEXOL 350 MG/ML SOLN COMPARISON:  Prior CT and MRI from earlier the same evening. FINDINGS: CTA NECK FINDINGS Aortic arch: Visualized aortic arch normal caliber with normal 3 vessel morphology. Mild atheromatous change about the arch itself. No stenosis about the origin of the great vessels. Right carotid system: Right common and internal carotid arteries are patent without stenosis or dissection. Left carotid system: Left common and internal carotid arteries are patent without stenosis or dissection. Vertebral arteries: Both vertebral arteries arise from subclavian arteries. No proximal subclavian artery stenosis. Vertebral arteries are patent without stenosis or dissection. Skeleton: No discrete or worrisome osseous lesions. Mild for age spondylosis present at C5-6 and C6-7. Other neck: No other acute abnormality within the neck. Upper chest: Visualized upper chest demonstrates no acute finding. Review of the MIP images confirms the above findings CTA HEAD FINDINGS Anterior circulation: Is mild atheromatous change about the carotid siphons without hemodynamically significant stenosis. A1 segments patent bilaterally. Normal anterior communicating complex. Anterior cerebral arteries patent without stenosis. No M1 stenosis or occlusion. Distal left MCA branches are patent and well perfused. On the right, there is irregularity with probable severe stenosis involving a proximal right M2 branch just distal to the bifurcation (series 7, image 130), inferior division. Additionally, there is occlusion of  a distal right M3 branch, superior division (series 7, image 108). This corresponds with susceptibility artifact seen on prior brain MRI, consistent with intraluminal thrombus. The remainder of the right MCA branches remain patent and perfused. Posterior circulation: Both V4 segments patent without stenosis. Both PICA grossly patent at their origins. Basilar patent without stenosis. Superior cerebral arteries patent bilaterally. Both PCAs primarily supplied via the basilar well perfused or distal aspects. Venous sinuses: Patent allowing for timing the contrast bolus. Anatomic variants: None significant.  No aneurysm. Review of the MIP images confirms the above findings IMPRESSION: 1. Occlusion of a distal right M3 branch, superior division. This corresponds with susceptibility artifact seen on prior MRI, and is consistent with intraluminal thrombus. Finding is in keeping with the previously identified small right MCA territory infarct. 2. Additional irregularity with severe stenosis involving a proximal right M2 branch, inferior division. The remainder of the right MCA branches remain patent and perfused. 3. Mild atheromatous change about the carotid siphons without hemodynamically significant stenosis. Case discussed by telephone around the time of image acquisition 11/09/2022 at approximately 1:20 a.m. with provider ERIC LINDZEN. Electronically Signed   By: Rise Mu M.D.   On: 11/09/2022 02:20  MR BRAIN WO CONTRAST  Result Date: 11/09/2022 CLINICAL DATA:  Initial evaluation for neuro deficit, stroke. EXAM: MRI HEAD WITHOUT CONTRAST TECHNIQUE: Multiplanar, multiecho pulse sequences of the brain and surrounding structures were obtained without intravenous contrast. COMPARISON:  Prior study from 11/08/2022. FINDINGS: Brain: Cerebral volume within normal limits. No significant cerebral white matter disease for age. Subtle small focus of restricted diffusion is seen involving the posterior right corona  radiata, extending towards the external capsule (series 2, image 32). This measures approximately 2.3 cm in length. Associated signal loss on ADC map (series 250, image 32). Finding suspicious for a tiny acute right MCA territory infarct. No associated hemorrhage or mass effect. No other evidence for acute or subacute ischemia. Gray-white matter differentiation otherwise maintained. No areas of chronic cortical infarction. No acute or chronic intracranial blood products. No mass lesion, midline shift or mass effect no hydrocephalus or extra-axial fluid collection. Pituitary gland suprasellar region within normal limits. Vascular: Small focus of susceptibility artifact at the level of the right sylvian fissure, suspected to reflect small volume intraluminal thrombus within a distal right MCA branch (series 8, image 59). Major intracranial vascular flow voids are otherwise maintained. Skull and upper cervical spine: Craniocervical junction within normal limits. Bone marrow signal intensity normal. No scalp soft tissue abnormality. Sinuses/Orbits: Globes orbital soft tissues within normal limits. Chronic right frontoethmoidal sinus disease. Mild mucosal thickening noted about the maxillary sinuses. Superimposed small right maxillary sinus retention cyst. No mastoid effusion. Other: None. IMPRESSION: 1. Subtle 2.3 cm focus of restricted diffusion involving the posterior right corona radiata/external capsule, consistent with a small acute right MCA territory infarct. No associated hemorrhage or mass effect. 2. Small focus of susceptibility artifact at the level of the right Sylvian fissure, suspected to reflect intraluminal thrombus within a distal right MCA branch. 3. Otherwise normal brain MRI for age. 4. Chronic right frontoethmoidal sinus disease. Electronically Signed   By: Rise Mu M.D.   On: 11/09/2022 01:22   CT HEAD CODE STROKE WO CONTRAST  Result Date: 11/08/2022 CLINICAL DATA:  Code stroke.  Initial evaluation for neuro deficit, stroke. EXAM: CT HEAD WITHOUT CONTRAST TECHNIQUE: Contiguous axial images were obtained from the base of the skull through the vertex without intravenous contrast. RADIATION DOSE REDUCTION: This exam was performed according to the departmental dose-optimization program which includes automated exposure control, adjustment of the mA and/or kV according to patient size and/or use of iterative reconstruction technique. COMPARISON:  None Available. FINDINGS: Brain: Cerebral volume within normal limits. No acute intracranial hemorrhage. No acute large vessel territory infarct. No mass lesion, mass effect or midline shift. No hydrocephalus or extra-axial fluid collection. Vascular: No appreciable abnormal hyperdense vessel. Scattered vascular calcifications noted within the carotid siphons. Skull: Scalp soft tissues and calvarium demonstrate no acute finding. Sinuses/Orbits: Globes orbital soft tissues within normal limits. Chronic right frontoethmoidal sinusitis noted. Mild mucosal thickening noted about the ethmoidal air cells as well. Mastoid air cells are clear. Other: None. ASPECTS [facility] Stroke Program Early CT Score) - Ganglionic level infarction (caudate, lentiform nuclei, internal capsule, insula, M1-M3 cortex): 7 - Supraganglionic infarction (M4-M6 cortex): 3 Total score (0-10 with 10 being normal): 10 IMPRESSION: 1. No acute intracranial abnormality. 2. ASPECTS is 10. 3. Chronic right frontoethmoidal sinusitis. These results were communicated to Dr. Otelia Limes at 11:06 pm on 11/08/2022 by text page via the [facility] messaging system. Electronically Signed   By: Rise Mu M.D.   On: 11/08/2022 23:07      HISTORY OF PRESENT  ILLNESS Bijon Siemon is an 57 y.o. male with a PMHx of DM, ED, gout and HTN who presents from home via EMS as a Code Stroke after acute onset of slurred speech and left sided weakness. LKN was 2148 on 11/08/22. On arrival his weakness was  resolved but he still had residual mild speech hesitancy. He also was complaining of left sided numbness and tingling with EMS along with shuffling gait with dragging of his left foot on EMS initial assessment. The patient also complained of his left arm feeling it was "not my own". CBG per EMS was 84. He was given juice and peanut butter with improvement of CBG to 115 but his symptoms persisted. BP was 164/82. A Code Stroke was called in the field. He is not on any blood thinners or antiplatelet medications.  Work up revealed patient with a small acute right MCA infarct.   HOSPITAL COURSE Stroke: Small acute right MCA ischemic infarct Etiology: Likely cardioembolic Code Stroke  CT head No acute abnormality.  ASPECTS 10.    CTA head & neck occlusion of right distal M3.  Irregularity with severe stenosis in proximal right M2 MRI  small acute right MCA infarct 24 hours CT head scheduled for 1:00 in the morning 5/19 [place] BLE- no evidence of DVT in the lower extremities bilaterally 2D Echo LVEF 60-65%.  No regional wall motion abnormalities.  Moderate LVH.  There is dilation of the ascending aorta, measuring 40 mm.  The interatrial septum was not well-visualized. Hypercoagulable panel pending LDL 96 HgbA1c 5.6 VTE prophylaxis -ambulate       Diet    Diet regular Room service appropriate? Yes; Fluid consistency: Thin        No antithrombotics prior to admission, now on aspirin 81 mg PO daily and clopidogrel 75 mg PO daily for 3 weeks followed by aspirin monotherapy. Therapy recommendations: No follow-up recommended Disposition: Discharge to home   Anxiety depression Continue Wellbutrin   Hypertension Home meds: Amlodipine 10 mg, bisoprolol 10 mg Stable Long-term BP goal normotensive   Hyperlipidemia Home meds: None LDL 96, goal < 70 Add Crestor 20 mg Continue statin at discharge   Questionable new onset A-fib EKG with sinus rhythm with first-degree AV block and PACs Per cardiology,  patient will be placed on 30-day event monitor at discharge   Diabetes type II Controlled Home meds: Trulicity HgbA1c 5.6, goal < 7.0 CBGs Recent Labs (last 2 labs)      Recent Labs    11/09/22 0743 11/09/22 1201  GLUCAP 85 88        Hypokalemia, improving K2.8 > 3.3 s/p replacement  Hypomagnesemia Mg 1.3, improved to 1.7 after replacement 11/09/22   EtOH abuse Marijuana use CIWA protocol UDS positive for THC Thiamine folate and multivitamin Patient is willing to quit and looking for resources Will consult TOC for assistance   Other Stroke Risk Factors  ETOH use, alcohol level <10, advised to drink no more than 2 drink(s) a day Substance abuse - UDS:  THC POSITIVE, Cocaine NONE DETECTED. Patient advised to stop using due to stroke risk.   Other Active Problems Gout GERD CKD stage 2 due to elevated Cr and GFR >60. Anemia Hgb 8.9 > 8.4, asymptomatic. Will need close PCP follow up for further evaluation and management of anemia.   DISCHARGE EXAM Blood pressure 132/85, pulse 63, temperature 98.1 F (36.7 C), temperature source Axillary, resp. rate 15, height 6\' 6"  (1.981 m), weight 111.7 kg, SpO2 96 %.  General -  Well nourished, well developed, in no apparent distress. Cardiovascular -questionable A-fib on monitor   Mental Status -  Level of arousal and orientation to time, place, and person were intact. Language including expression, naming, repetition, comprehension was assessed and found intact. Attention span and concentration were normal. Recent and remote memory were intact. Fund of Knowledge was assessed and was intact.   Cranial Nerves II - XII - II - Visual field intact OU. III, IV, VI - Extraocular movements intact. V - Facial sensation intact bilaterally. VII - Facial movement intact bilaterally. VIII - Hearing & vestibular intact bilaterally. X - Palate elevates symmetrically. XI - Chin turning & shoulder shrug intact bilaterally. XII - Tongue  protrusion intact.   Motor Strength -right arm 5/5, left arm 5-/5 with no drift. bilateral lower extremities 5/5 Motor Tone - Muscle tone was assessed at the neck and appendages and was normal.   Sensory - Light touch, temperature/pinprick were assessed and were symmetrical.     Coordination -mild ataxia left arm   Gait and Station - deferred.  Discharge Diet       Diet   Diet regular Room service appropriate? Yes; Fluid consistency: Thin   liquids  DISCHARGE PLAN Disposition: Discharge to home aspirin 81 mg daily and clopidogrel 75 mg daily for secondary stroke prevention for 3 weeks then aspirin alone. Ongoing stroke risk factor control by Primary Care Physician at time of discharge Follow-up PCP Nafziger, Kandee Keen, NP in 2 weeks. Follow-up in [facility] in 8 weeks, office to schedule an appointment.   35 minutes were spent preparing discharge.  Lanae Boast, AGACNP-BC [facility] Pager: [phone]  ATTENDING ATTESTATION:  Dr. Viviann Spare evaluated pt independently, reviewed imaging, chart, labs. Discussed and formulated plan with the Resident/APP. Changes were made to the note where appropriate. Please see APP/resident note above for details.   Total 36 minutes spent on counseling patient and coordinating care, writing notes and reviewing chart.   Zackarey Holleman,MD

## 2022-11-10 NOTE — Progress Notes (Signed)
Ordered 30 Day Event Monitor at the request of Dr. Carolan Clines. Please see her rounding note from today for more information.  Corrin Parker, PA-C 11/10/2022 9:35 AM

## 2022-11-10 NOTE — Progress Notes (Signed)
SLP Cancellation Note  Patient Details Name: Keith Leblanc MRN: [id number] DOB: Mar 24, 1966   Cancelled treatment:       Reason Eval/Treat Not Completed: SLP screened, no needs identified, will sign off  Angela Nevin, MA, CCC-SLP Speech Therapy

## 2022-11-10 NOTE — Progress Notes (Addendum)
Rounding Note    Patient Name: Keith Leblanc Date of Encounter: 11/10/2022  [facility] Cardiologist: None   Subjective   Feels well this AM. No weakness. Read to go home  Inpatient Medications    Scheduled Meds:   stroke: early stages of recovery book   Does not apply Once   amLODipine  10 mg Oral Daily   bisoprolol  20 mg Oral Daily   buPROPion  300 mg Oral Daily   Chlorhexidine Gluconate Cloth  6 each Topical q morning   colchicine  0.6 mg Oral Daily   enoxaparin (LOVENOX) injection  40 mg Subcutaneous Daily   folic acid  1 mg Oral Daily   insulin aspart  0-15 Units Subcutaneous TID WC   multivitamin with minerals  1 tablet Oral Daily   pantoprazole (PROTONIX) IV  40 mg Intravenous QHS   potassium chloride  20 mEq Oral BID   rosuvastatin  20 mg Oral Daily   thiamine  100 mg Oral Daily   Continuous Infusions:  clevidipine     PRN Meds: acetaminophen **OR** acetaminophen (TYLENOL) oral liquid 160 mg/5 mL **OR** acetaminophen, hydrALAZINE, labetalol, senna-docusate   Vital Signs    Vitals:   11/10/22 0500 11/10/22 0600 11/10/22 0700 11/10/22 0800  BP: 126/74 119/75 (!) 144/85 135/85  Pulse: 62 61 (!) 58 61  Resp: 16 18 16  (!) 27  Temp:      TempSrc:      SpO2: 98% 97% 96% 97%  Weight:      Height:        Intake/Output Summary (Last 24 hours) at 11/10/2022 0908 Last data filed at 11/10/2022 0000 Gross per 24 hour  Intake 477.63 ml  Output 1300 ml  Net -822.37 ml      11/09/2022    5:10 AM 11/08/2022   11:17 PM 11/08/2022   10:52 PM  Last 3 Weights  Weight (lbs) 246 lb 4.1 oz 258 lb 6.1 oz 258 lb 6.1 oz  Weight (kg) 111.7 kg 117.2 kg 117.2 kg      Telemetry    Sinus rhythm, PACs, 1st degree AV block - Personally Reviewed  ECG    No new - Personally Reviewed  Physical Exam   Vitals:   11/10/22 0700 11/10/22 0800  BP: (!) 144/85 135/85  Pulse: (!) 58 61  Resp: 16 (!) 27  Temp:    SpO2: 96% 97%    GEN: No acute distress.    Neck: No JVD Cardiac: RRR, no murmurs, rubs, or gallops.  Respiratory: Clear to auscultation bilaterally. GI: Soft, nontender, non-distended  MS: No edema; No deformity. Neuro:  Nonfocal  Psych: Normal affect   Labs    High Sensitivity Troponin:  No results for input(s): "TROPONINIHS" in the last 720 hours.   Chemistry Recent Labs  Lab 11/08/22 2251 11/08/22 2252 11/09/22 0045 11/09/22 1031 11/09/22 1511 11/10/22 0321  NA 140   < > 139  --  136 137  K 3.3*   < > 2.8*  --  3.5 3.3*  CL 106   < > 103  --  103 105  CO2 24  --  23  --  24 22  GLUCOSE 89   < > 85  --  107* 80  BUN 18   < > 16  --  9 11  CREATININE 1.66*   < > 1.54*  --  1.37* 1.29*  CALCIUM 8.8*  --  8.9  --  9.1 8.5*  MG  --    < > 1.3* 1.3* 1.8 1.7  PROT 6.6  --   --   --   --   --   ALBUMIN 3.6  --   --   --   --   --   AST 21  --   --   --   --   --   ALT 18  --   --   --   --   --   ALKPHOS 33*  --   --   --   --   --   BILITOT 0.7  --   --   --   --   --   GFRNONAA 49*  --  53*  --  >60 >60  ANIONGAP 10  --  13  --  9 10   < > = values in this interval not displayed.    Lipids  Recent Labs  Lab 11/09/22 0045  CHOL 199  TRIG 135  HDL 76  LDLCALC 96  CHOLHDL 2.6    Hematology Recent Labs  Lab 11/08/22 2251 11/08/22 2252 11/09/22 0045 11/10/22 0321  WBC 6.6  --  8.3 4.8  RBC 3.62*  --  3.79* 3.52*  HGB 8.3* 10.2* 8.9* 8.4*  HCT 28.8* 30.0* 30.4* 27.7*  MCV 79.6*  --  80.2 78.7*  MCH 22.9*  --  23.5* 23.9*  MCHC 28.8*  --  29.3* 30.3  RDW 16.3*  --  16.5* 16.8*  PLT 350  --  376 298   Thyroid  Recent Labs  Lab 11/09/22 1031  TSH 1.813    BNPNo results for input(s): "BNP", "PROBNP" in the last 168 hours.  DDimer No results for input(s): "DDIMER" in the last 168 hours.   Radiology    VAS [place] LOWER EXTREMITY VENOUS (DVT)  Result Date: 11/10/2022  Lower Venous DVT Study Patient Name:  Keith Leblanc  Date of Exam:   11/09/2022 Medical Rec #: [id number]        Accession #:     [id number] Date of Birth: 1965/08/31        Patient Gender: M Patient Age:   57 years Exam Location:  [facility] Procedure:      VAS [place] LOWER EXTREMITY VENOUS (DVT) Referring Phys: Gevena Mart --------------------------------------------------------------------------------  Indications: Stroke.  Comparison Study: No prior study on file Performing Technologist: Sherren Kerns RVS  Examination Guidelines: A complete evaluation includes B-mode imaging, spectral Doppler, color Doppler, and power Doppler as needed of all accessible portions of each vessel. Bilateral testing is considered an integral part of a complete examination. Limited examinations for reoccurring indications may be performed as noted. The reflux portion of the exam is performed with the patient in reverse Trendelenburg.  +---------+---------------+---------+-----------+----------+--------------+ RIGHT    CompressibilityPhasicitySpontaneityPropertiesThrombus Aging +---------+---------------+---------+-----------+----------+--------------+ CFV      Full           Yes      Yes                                 +---------+---------------+---------+-----------+----------+--------------+ SFJ      Full                                                        +---------+---------------+---------+-----------+----------+--------------+  FV Prox  Full                                                        +---------+---------------+---------+-----------+----------+--------------+ FV Mid   Full           Yes      Yes                                 +---------+---------------+---------+-----------+----------+--------------+ FV DistalFull                                                        +---------+---------------+---------+-----------+----------+--------------+ PFV      Full                                                        +---------+---------------+---------+-----------+----------+--------------+  POP      Full           Yes      Yes                                 +---------+---------------+---------+-----------+----------+--------------+ PTV      Full                                                        +---------+---------------+---------+-----------+----------+--------------+ PERO     Full                                                        +---------+---------------+---------+-----------+----------+--------------+   +---------+---------------+---------+-----------+----------+--------------+ LEFT     CompressibilityPhasicitySpontaneityPropertiesThrombus Aging +---------+---------------+---------+-----------+----------+--------------+ CFV      Full           Yes      Yes                                 +---------+---------------+---------+-----------+----------+--------------+ SFJ      Full                                                        +---------+---------------+---------+-----------+----------+--------------+ FV Prox  Full                                                        +---------+---------------+---------+-----------+----------+--------------+  FV Mid   Full           Yes      Yes                                 +---------+---------------+---------+-----------+----------+--------------+ FV DistalFull                                                        +---------+---------------+---------+-----------+----------+--------------+ PFV      Full                                                        +---------+---------------+---------+-----------+----------+--------------+ POP      Full           Yes      Yes                                 +---------+---------------+---------+-----------+----------+--------------+ PTV      Full                                                        +---------+---------------+---------+-----------+----------+--------------+ PERO     Full                                                         +---------+---------------+---------+-----------+----------+--------------+     Summary: BILATERAL: - No evidence of deep vein thrombosis seen in the lower extremities, bilaterally. -No evidence of popliteal cyst, bilaterally.   *See table(s) above for measurements and observations. Electronically signed by Gerarda Fraction on 11/10/2022 at 8:32:52 AM.    Final    CT HEAD WO CONTRAST ( )  Result Date: 11/10/2022 CLINICAL DATA:  Follow-up examination for stroke. EXAM: CT HEAD WITHOUT CONTRAST TECHNIQUE: Contiguous axial images were obtained from the base of the skull through the vertex without intravenous contrast. RADIATION DOSE REDUCTION: This exam was performed according to the departmental dose-optimization program which includes automated exposure control, adjustment of the mA and/or kV according to patient size and/or use of iterative reconstruction technique. COMPARISON:  Prior studies from 11/09/2022. FINDINGS: Brain: Cerebral volume within normal limits. Previously identified small right MCA distribution infarct difficult to visualized by CT, but appears grossly stable. No acute intracranial hemorrhage. No other new large vessel territory infarct. No mass lesion, midline shift or mass effect. No hydrocephalus or extra-axial fluid collection. Vascular: No abnormal hyperdense vessel. Scattered vascular calcifications noted within the carotid siphons. Skull: Scalp soft tissues and calvarium demonstrate no new finding. Sinuses/Orbits: Globes and orbital soft tissues within normal limits. Chronic right frontoethmoidal sinus disease again noted. Right maxillary sinus retention cyst. Mastoid air cells remain clear. Other: None. IMPRESSION: 1. Grossly stable small right MCA distribution infarct, difficult to visualized  by CT. No evidence for hemorrhagic transformation or other complication. 2. No other new acute intracranial abnormality. Electronically Signed   By: Rise Mu  M.D.   On: 11/10/2022 02:51   ECHOCARDIOGRAM COMPLETE  Result Date: 11/09/2022    ECHOCARDIOGRAM REPORT   Patient Name:   Keith Leblanc Date of Exam: 11/09/2022 Medical Rec #:  [id number]       Height:       78.0 in Accession #:    [id number]      Weight:       246.3 lb Date of Birth:  1966-02-13       BSA:          2.466 m Patient Age:    56 years        BP:           133/71 mmHg Patient Gender: M               HR:           63 bpm. Exam Location:  Inpatient Procedure: 2D Echo, 3D Echo, Cardiac Doppler, Color Doppler and Strain Analysis Indications:    Stroke  History:        Patient has prior history of Echocardiogram examinations, most                 recent 02/08/2004. Stroke; Risk Factors:Hypertension, Current                 Smoker and Diabetes.  Sonographer:    Sheralyn Boatman RDCS Referring Phys: [id number] ERIC LINDZEN IMPRESSIONS  1. Left ventricular ejection fraction, by estimation, is 60 to 65%. Left ventricular ejection fraction by 3D volume is 61 %. The left ventricle has normal function. The left ventricle has no regional wall motion abnormalities. There is moderate left ventricular hypertrophy. Left ventricular diastolic parameters are indeterminate. The average left ventricular global longitudinal strain is -20.7 %. The global longitudinal strain is normal.  2. Right ventricular systolic function is normal. The right ventricular size is normal. Tricuspid regurgitation signal is inadequate for assessing PA pressure.  3. The mitral valve is normal in structure. No evidence of mitral valve regurgitation.  4. The aortic valve is tricuspid. Aortic valve regurgitation is not visualized. No aortic stenosis is present.  5. Aortic dilatation noted. There is dilatation of the ascending aorta, measuring 40 mm.  6. The inferior vena cava is normal in size with greater than 50% respiratory variability, suggesting right atrial pressure of 3 mmHg. FINDINGS  Left Ventricle: Left ventricular ejection fraction, by estimation,  is 60 to 65%. Left ventricular ejection fraction by 3D volume is 61 %. The left ventricle has normal function. The left ventricle has no regional wall motion abnormalities. The average left ventricular global longitudinal strain is -20.7 %. The global longitudinal strain is normal. The left ventricular internal cavity size was normal in size. There is moderate left ventricular hypertrophy. Left ventricular diastolic parameters are indeterminate. Right Ventricle: The right ventricular size is normal. No increase in right ventricular wall thickness. Right ventricular systolic function is normal. Tricuspid regurgitation signal is inadequate for assessing PA pressure. Left Atrium: Left atrial size was normal in size. Right Atrium: Right atrial size was normal in size. Pericardium: There is no evidence of pericardial effusion. Mitral Valve: The mitral valve is normal in structure. No evidence of mitral valve regurgitation. Tricuspid Valve: The tricuspid valve is normal in structure. Tricuspid valve regurgitation is trivial. Aortic Valve: The aortic valve is tricuspid. Aortic  valve regurgitation is not visualized. No aortic stenosis is present. Pulmonic Valve: The pulmonic valve was not well visualized. Pulmonic valve regurgitation is not visualized. Aorta: The aortic root is normal in size and structure and aortic dilatation noted. There is dilatation of the ascending aorta, measuring 40 mm. Venous: The inferior vena cava is normal in size with greater than 50% respiratory variability, suggesting right atrial pressure of 3 mmHg. IAS/Shunts: The interatrial septum was not well visualized.  LEFT VENTRICLE PLAX 2D LVIDd:         4.75 cm         Diastology LVIDs:         3.00 cm         LV e' medial:    5.98 cm/s LV PW:         1.60 cm         LV E/e' medial:  16.3 LV IVS:        1.40 cm         LV e' lateral:   7.73 cm/s LVOT diam:     2.40 cm         LV E/e' lateral: 12.6 LV SV:         98 LV SV Index:   40              2D  LVOT Area:     4.52 cm        Longitudinal                                Strain                                2D Strain GLS  -20.7 %                                Avg:                                 3D Volume EF                                LV 3D EF:    Left                                             ventricul                                             ar                                             ejection  fraction                                             by 3D                                             volume is                                             61 %.                                 3D Volume EF:                                3D EF:        61 %                                LV EDV:       183 ml                                LV ESV:       71 ml                                LV SV:        112 ml RIGHT VENTRICLE             IVC RV S prime:     10.30 cm/s  IVC diam: 2.00 cm TAPSE (M-mode): 2.0 cm LEFT ATRIUM             Index        RIGHT ATRIUM          Index LA diam:        3.80 cm 1.54 cm/m   RA Area:     8.78 cm LA Vol (A2C):   27.6 ml 11.19 ml/m  RA Volume:   16.10 ml 6.53 ml/m LA Vol (A4C):   40.8 ml 16.54 ml/m LA Biplane Vol: 33.1 ml 13.42 ml/m  AORTIC VALVE LVOT Vmax:   112.00 cm/s LVOT Vmean:  71.700 cm/s LVOT VTI:    0.217 m  AORTA Ao Root diam: 3.80 cm Ao Asc diam:  4.00 cm MITRAL VALVE MV Area (PHT): 3.17 cm    SHUNTS MV Decel Time: 240 msec    Systemic VTI:  0.22 m MV E velocity: 97.45 cm/s  Systemic Diam: 2.40 cm MV A velocity: 64.70 cm/s MV E/A ratio:  1.51 Epifanio Lesches MD Electronically signed by Epifanio Lesches MD Signature Date/Time: 11/09/2022/3:47:28 PM    Final    CT ANGIO HEAD NECK W WO CM  Result Date: 11/09/2022 CLINICAL DATA:  Initial evaluation for neuro deficit, stroke. EXAM: CT ANGIOGRAPHY HEAD AND NECK WITH AND WITHOUT CONTRAST TECHNIQUE: Multidetector CT imaging of the head and  neck was performed  using the standard protocol during bolus administration of intravenous contrast. Multiplanar CT image reconstructions and MIPs were obtained to evaluate the vascular anatomy. Carotid stenosis measurements (when applicable) are obtained utilizing NASCET criteria, using the distal internal carotid diameter as the denominator. RADIATION DOSE REDUCTION: This exam was performed according to the departmental dose-optimization program which includes automated exposure control, adjustment of the mA and/or kV according to patient size and/or use of iterative reconstruction technique. CONTRAST:  75mL OMNIPAQUE IOHEXOL 350 MG/ML SOLN COMPARISON:  Prior CT and MRI from earlier the same evening. FINDINGS: CTA NECK FINDINGS Aortic arch: Visualized aortic arch normal caliber with normal 3 vessel morphology. Mild atheromatous change about the arch itself. No stenosis about the origin of the great vessels. Right carotid system: Right common and internal carotid arteries are patent without stenosis or dissection. Left carotid system: Left common and internal carotid arteries are patent without stenosis or dissection. Vertebral arteries: Both vertebral arteries arise from subclavian arteries. No proximal subclavian artery stenosis. Vertebral arteries are patent without stenosis or dissection. Skeleton: No discrete or worrisome osseous lesions. Mild for age spondylosis present at C5-6 and C6-7. Other neck: No other acute abnormality within the neck. Upper chest: Visualized upper chest demonstrates no acute finding. Review of the MIP images confirms the above findings CTA HEAD FINDINGS Anterior circulation: Is mild atheromatous change about the carotid siphons without hemodynamically significant stenosis. A1 segments patent bilaterally. Normal anterior communicating complex. Anterior cerebral arteries patent without stenosis. No M1 stenosis or occlusion. Distal left MCA branches are patent and well perfused. On the right, there is  irregularity with probable severe stenosis involving a proximal right M2 Krysteena Stalker just distal to the bifurcation (series 7, image 130), inferior division. Additionally, there is occlusion of a distal right M3 Sigifredo Pignato, superior division (series 7, image 108). This corresponds with susceptibility artifact seen on prior brain MRI, consistent with intraluminal thrombus. The remainder of the right MCA branches remain patent and perfused. Posterior circulation: Both V4 segments patent without stenosis. Both PICA grossly patent at their origins. Basilar patent without stenosis. Superior cerebral arteries patent bilaterally. Both PCAs primarily supplied via the basilar well perfused or distal aspects. Venous sinuses: Patent allowing for timing the contrast bolus. Anatomic variants: None significant.  No aneurysm. Review of the MIP images confirms the above findings IMPRESSION: 1. Occlusion of a distal right M3 Shatisha Falter, superior division. This corresponds with susceptibility artifact seen on prior MRI, and is consistent with intraluminal thrombus. Finding is in keeping with the previously identified small right MCA territory infarct. 2. Additional irregularity with severe stenosis involving a proximal right M2 Rajanae Mantia, inferior division. The remainder of the right MCA branches remain patent and perfused. 3. Mild atheromatous change about the carotid siphons without hemodynamically significant stenosis. Case discussed by telephone around the time of image acquisition 11/09/2022 at approximately 1:20 a.m. with provider ERIC LINDZEN. Electronically Signed   By: Rise Mu M.D.   On: 11/09/2022 02:20   MR BRAIN WO CONTRAST  Result Date: 11/09/2022 CLINICAL DATA:  Initial evaluation for neuro deficit, stroke. EXAM: MRI HEAD WITHOUT CONTRAST TECHNIQUE: Multiplanar, multiecho pulse sequences of the brain and surrounding structures were obtained without intravenous contrast. COMPARISON:  Prior study from 11/08/2022.  FINDINGS: Brain: Cerebral volume within normal limits. No significant cerebral white matter disease for age. Subtle small focus of restricted diffusion is seen involving the posterior right corona radiata, extending towards the external capsule (series 2, image 32). This measures approximately 2.3 cm  in length. Associated signal loss on ADC map (series 250, image 32). Finding suspicious for a tiny acute right MCA territory infarct. No associated hemorrhage or mass effect. No other evidence for acute or subacute ischemia. Gray-white matter differentiation otherwise maintained. No areas of chronic cortical infarction. No acute or chronic intracranial blood products. No mass lesion, midline shift or mass effect no hydrocephalus or extra-axial fluid collection. Pituitary gland suprasellar region within normal limits. Vascular: Small focus of susceptibility artifact at the level of the right sylvian fissure, suspected to reflect small volume intraluminal thrombus within a distal right MCA Christophr Calix (series 8, image 59). Major intracranial vascular flow voids are otherwise maintained. Skull and upper cervical spine: Craniocervical junction within normal limits. Bone marrow signal intensity normal. No scalp soft tissue abnormality. Sinuses/Orbits: Globes orbital soft tissues within normal limits. Chronic right frontoethmoidal sinus disease. Mild mucosal thickening noted about the maxillary sinuses. Superimposed small right maxillary sinus retention cyst. No mastoid effusion. Other: None. IMPRESSION: 1. Subtle 2.3 cm focus of restricted diffusion involving the posterior right corona radiata/external capsule, consistent with a small acute right MCA territory infarct. No associated hemorrhage or mass effect. 2. Small focus of susceptibility artifact at the level of the right Sylvian fissure, suspected to reflect intraluminal thrombus within a distal right MCA Graesyn Schreifels. 3. Otherwise normal brain MRI for age. 4. Chronic right  frontoethmoidal sinus disease. Electronically Signed   By: Rise Mu M.D.   On: 11/09/2022 01:22   CT HEAD CODE STROKE WO CONTRAST  Result Date: 11/08/2022 CLINICAL DATA:  Code stroke. Initial evaluation for neuro deficit, stroke. EXAM: CT HEAD WITHOUT CONTRAST TECHNIQUE: Contiguous axial images were obtained from the base of the skull through the vertex without intravenous contrast. RADIATION DOSE REDUCTION: This exam was performed according to the departmental dose-optimization program which includes automated exposure control, adjustment of the mA and/or kV according to patient size and/or use of iterative reconstruction technique. COMPARISON:  None Available. FINDINGS: Brain: Cerebral volume within normal limits. No acute intracranial hemorrhage. No acute large vessel territory infarct. No mass lesion, mass effect or midline shift. No hydrocephalus or extra-axial fluid collection. Vascular: No appreciable abnormal hyperdense vessel. Scattered vascular calcifications noted within the carotid siphons. Skull: Scalp soft tissues and calvarium demonstrate no acute finding. Sinuses/Orbits: Globes orbital soft tissues within normal limits. Chronic right frontoethmoidal sinusitis noted. Mild mucosal thickening noted about the ethmoidal air cells as well. Mastoid air cells are clear. Other: None. ASPECTS [facility] Stroke Program Early CT Score) - Ganglionic level infarction (caudate, lentiform nuclei, internal capsule, insula, M1-M3 cortex): 7 - Supraganglionic infarction (M4-M6 cortex): 3 Total score (0-10 with 10 being normal): 10 IMPRESSION: 1. No acute intracranial abnormality. 2. ASPECTS is 10. 3. Chronic right frontoethmoidal sinusitis. These results were communicated to Dr. Otelia Limes at 11:06 pm on 11/08/2022 by text page via the [facility] messaging system. Electronically Signed   By: Rise Mu M.D.   On: 11/08/2022 23:07    Cardiac Studies   TTE 11/09/2022  1. Left ventricular ejection  fraction, by estimation, is 60 to 65%. Left  ventricular ejection fraction by 3D volume is 61 %. The left ventricle has  normal function. The left ventricle has no regional wall motion  abnormalities. There is moderate left  ventricular hypertrophy. Left ventricular diastolic parameters are  indeterminate. The average left ventricular global longitudinal strain is  -20.7 %. The global longitudinal strain is normal.   2. Right ventricular systolic function is normal. The right ventricular  size is  normal. Tricuspid regurgitation signal is inadequate for assessing  PA pressure.   3. The mitral valve is normal in structure. No evidence of mitral valve  regurgitation.   4. The aortic valve is tricuspid. Aortic valve regurgitation is not  visualized. No aortic stenosis is present.   5. Aortic dilatation noted. There is dilatation of the ascending aorta,  measuring 40 mm.   6. The inferior vena cava is normal in size with greater than 50%  respiratory variability, suggesting right atrial pressure of 3 mmHg.   Patient Profile     Keith Leblanc is a 57 y.o. male with a history of hypertension, type 2 diabetes mellitus, and gout who is being seen 11/09/2022 for the evaluation of c/f atrial fibrillation at the request of Dr. Otelia Limes. Here with CVA (small acute R MCA infarct) s/p TNK 5/18  Assessment & Plan    Irregular rhythm - tele shows sinus with PACs, 1st degree AV block. Tele has been reading afib; when the baseline is poor the rhythm is irregular with a false reading. Again unlikely to be going in and out of afib.  - will arrange for a 30 day cardiac monitor and FU with him - normal echocardiogram - otherwise from a cardiology standpoint, he can d/c  Otherwise, per primary team: - Acute stroke - Hypertension - Type 2 diabetes mellitus - Hypokalemia   For questions or updates, please contact [facility] Please consult [email or web address] for contact info under         Signed, Maisie Fus, MD  11/10/2022, 9:08 AM

## 2022-11-11 ENCOUNTER — Telehealth: Payer: Self-pay

## 2022-11-11 ENCOUNTER — Other Ambulatory Visit: Payer: Self-pay | Admitting: Student

## 2022-11-11 DIAGNOSIS — I440 Atrioventricular block, first degree: Secondary | ICD-10-CM

## 2022-11-11 DIAGNOSIS — I639 Cerebral infarction, unspecified: Secondary | ICD-10-CM

## 2022-11-11 DIAGNOSIS — R42 Dizziness and giddiness: Secondary | ICD-10-CM

## 2022-11-11 DIAGNOSIS — I491 Atrial premature depolarization: Secondary | ICD-10-CM

## 2022-11-11 LAB — BETA-2-GLYCOPROTEIN I ABS, IGG/M/A
Beta-2 Glyco I IgG: <9 GPI IgG units (ref 0–20)
Beta-2-Glycoprotein I IgA: <9 GPI IgA units (ref 0–25)
Beta-2-Glycoprotein I IgM: <9 GPI IgM units (ref 0–32)

## 2022-11-11 LAB — LUPUS ANTICOAGULANT PANEL
DRVVT: 33.9 s (ref 0.0–47.0)
PTT Lupus Anticoagulant: 29.4 s (ref 0.0–43.5)

## 2022-11-11 LAB — HOMOCYSTEINE: Homocysteine: 21.3 umol/L — ABNORMAL HIGH (ref 0.0–14.5)

## 2022-11-11 NOTE — Transitions of Care (Post Inpatient/ED Visit) (Signed)
   11/11/2022  Name: Keith Leblanc MRN: [id number] DOB: 03-09-1966  Today's TOC FU Call Status: Today's TOC FU Call Status:: Unsuccessul Call (1st Attempt) Unsuccessful Call (1st Attempt) Date: 11/11/22  Attempted to reach the patient regarding the most recent Inpatient/ED visit.  Follow Up Plan: Additional outreach attempts will be made to reach the patient to complete the Transitions of Care (Post Inpatient/ED visit) call.     Antionette Fairy, RN,BSN,CCM [facility] Care Management Care Management Community Coordinator Direct Phone: [phone] Toll Free: [phone] Fax: [phone]

## 2022-11-12 ENCOUNTER — Telehealth: Payer: Self-pay

## 2022-11-12 LAB — CARDIOLIPIN ANTIBODIES, IGG, IGM, IGA
Anticardiolipin IgA: <9 APL U/mL (ref 0–11)
Anticardiolipin IgG: <9 GPL U/mL (ref 0–14)
Anticardiolipin IgM: <9 MPL U/mL (ref 0–12)

## 2022-11-12 NOTE — Transitions of Care (Post Inpatient/ED Visit) (Signed)
11/12/2022  Name: Keith Leblanc MRN: [id number] DOB: Feb 19, 1966  Today's TOC FU Call Status: Today's TOC FU Call Status:: Successful TOC FU Call Competed TOC FU Call Complete Date: 11/12/22 (Incoming call from pt returning RN CM call)  Transition Care Management Follow-up Telephone Call Discharge Facility: [facility]) Type of Discharge: Inpatient Admission Primary Inpatient Discharge Diagnosis:: "CVA" How have you been since you were released from the hospital?: Better (Pt states he has had a very mild headache but otherwise doing okay sicne returning home. He is up ambulating with no issues-plans to return to work soon. Appetite fair.) Any questions or concerns?: No  Items Reviewed: Did you receive and understand the discharge instructions provided?: Yes Medications obtained,verified, and reconciled?: Yes (Medications Reviewed) Any new allergies since your discharge?: No Dietary orders reviewed?: Yes Type of Diet Ordered:: low salt/heart healthy/carb modified Do you have support at home?: Yes  Medications Reviewed Today: Medications Reviewed Today     Reviewed by Charlyn Minerva, RN (Registered Nurse) on 11/12/22 at 1033  Med List Status: <None>   Medication Order Taking? Sig Documenting Provider Last Dose Status Informant  allopurinol (ZYLOPRIM) 300 MG tablet [id number] Yes TAKE 1 TABLET BY MOUTH DAILY Nafziger, Kandee Keen, NP Taking Active Self  amLODipine (NORVASC) 10 MG tablet [id number] Yes TAKE 1 TABLET BY MOUTH DAILY Nafziger, Kandee Keen, NP Taking Active Self           Med Note (SATTERFIELD, Genoveva Ill   Sat Nov 09, 2022  1:24 AM) Patient verified he is still taking this medication   aspirin EC 81 MG tablet [id number] Yes Take 1 tablet (81 mg total) by mouth daily. Swallow whole. Kara Mead, NP Taking Active   bisoprolol (ZEBETA) 10 MG tablet [id number] Yes TAKE 2 TABLETS BY MOUTH DAILY Nafziger, Kandee Keen, NP Taking Active Self           Med Note (SATTERFIELD, Genoveva Ill   Sat Nov 09, 2022  1:25 AM) Patient states he is still taking this medication and dose is correct per patient   blood glucose meter kit and supplies [id number] Yes Dispense based on patient and insurance preference. Use up to four times daily as directed. (FOR ICD-9 250.00, 250.01). Rodolph Bong, MD Taking Active Self  buPROPion (WELLBUTRIN XL) 300 MG 24 hr tablet [id number] Yes TAKE 1 TABLET BY MOUTH ONCE  DAILY  Patient taking differently: Take 300 mg by mouth daily.   Nafziger, Kandee Keen, NP Taking Active Self  clopidogrel (PLAVIX) 75 MG tablet [id number] Yes Take 1 tablet (75 mg total) by mouth daily. Kara Mead, NP Taking Active   colchicine 0.6 MG tablet [id number] Yes TAKE 1 TABLET BY MOUTH  DAILY. WHEN HAVING FLARES  TAKE 1 TABLET BY MOUTH  TWICE DAILY  Patient taking differently: Take 0.6 mg by mouth daily.   Pollyann Savoy, MD Taking Active Self  Dulaglutide (TRULICITY) 0.75 MG/0.5ML Namon Cirri [id number] Yes Inject 0.75 mg in am weekly under skin Carlus Pavlov, MD Taking Active Self           Med Note Alvera Novel, Genoveva Ill   Sat Nov 09, 2022  1:25 AM) Usually take on Mondays per patient   fenofibrate (TRICOR) 145 MG tablet [id number]  Take 1 tablet (145 mg total) by mouth daily.  Patient not taking: Reported on 11/09/2022   Shirline Frees, NP  Active Self  Insulin Pen Needle (B-D UF III MINI PEN NEEDLES) 31G X 5 MM MISC [id number] Yes USE 4 TIMES DAILY  AFTER MEALS AND AT BEDTIME Carlus Pavlov, MD Taking Active Self  Lancets [facility] ULTRASOFT) lancets [id number] Yes Test blood sugars as directed. Dx E11.9 Shirline Frees, NP Taking Active Self  LANTUS SOLOSTAR 100 UNIT/ML Solostar Pen [id number] Yes INJECT SUBCUTANEOUSLY 10 TO 12  UNITS DAILY  Patient taking differently: Inject 30 Units into the skin daily.   Carlus Pavlov, MD Taking Active Self           Med Note Alvera Novel, Genoveva Ill   Sat Nov 09, 2022  1:26 AM) Patient verified dose is correct and he is taking this  medication per patient   Multiple Vitamin (MULTIVITAMIN WITH MINERALS) TABS tablet [id number] Yes Take 1 tablet by mouth daily. Kara Mead, NP Taking Active   omeprazole (PRILOSEC) 40 MG capsule [id number] Yes Take 1 capsule (40 mg total) by mouth daily. Shirline Frees, NP Taking Active Self  ONE TOUCH ULTRA TEST test strip [id number] Yes USE UP TO 4 TIMES DAILY AS DIRECTED Nelwyn Salisbury, MD Taking Active Self  sildenafil (REVATIO) 20 MG tablet [id number] Yes TAKE 2-5 TABLETS BY MOUTH AS NEEDED FOR SEXUAL ACTIVITY  Patient taking differently: Take 20 mg by mouth daily as needed (For ED).   Nafziger, Kandee Keen, NP Taking Active Self  thiamine (VITAMIN B-1) 100 MG tablet [id number] Yes Take 1 tablet (100 mg total) by mouth daily. Kara Mead, NP Taking Active             Home Care and Equipment/Supplies: Were Home Health Services Ordered?: NA Any new equipment or medical supplies ordered?: NA  Functional Questionnaire: Do you need assistance with bathing/showering or dressing?: No Do you need assistance with meal preparation?: No Do you need assistance with eating?: No Do you have difficulty maintaining continence: No Do you need assistance with getting out of bed/getting out of a chair/moving?: No Do you have difficulty managing or taking your medications?: No  Follow up appointments reviewed: PCP Follow-up appointment confirmed?: Yes Date of PCP follow-up appointment?: 11/13/22 (pt had appt with provider scheduled prior to admission-care guide changed visit type to hosp f/u) Follow-up Provider: [facility] Follow-up appointment confirmed?: Yes Date of Specialist follow-up appointment?: 12/16/22 Follow-Up Specialty Provider:: Dr. Wyline Mood Do you need transportation to your follow-up appointment?: No (pt confirms he is able to drive himself to appts) Do you understand care options if your condition(s) worsen?: Yes-patient verbalized understanding  SDOH  Interventions Today    Flowsheet Row Most Recent Value  SDOH Interventions   Food Insecurity Interventions Intervention Not Indicated  Transportation Interventions Intervention Not Indicated      TOC Interventions Today    Flowsheet Row Most Recent Value  TOC Interventions   TOC Interventions Discussed/Reviewed TOC Interventions Discussed      Interventions Today    Flowsheet Row Most Recent Value  Chronic Disease   Chronic disease during today's visit Other  [stroke]  General Interventions   General Interventions Discussed/Reviewed General Interventions Discussed, Doctor Visits, Referral to Nurse, Communication with, Community Resources, Horticulturist, commercial (DME)  [follow up appt scheduled with assigned nurse]  Doctor Visits Discussed/Reviewed Doctor Visits Discussed, Specialist, PCP  Durable Medical Equipment (DME) Glucomoter, BP Cuff, Other  [heart monitor-pt to begin wearing 30day heart monitor- reviewed info per mychart message sent to pt]  PCP/Specialist Visits Compliance with follow-up visit  Communication with RN, Social Work  Education Interventions   Education Provided Provided Education  Provided Verbal Education On Nutrition, When to see the doctor, Medication, Blood Sugar  Monitoring  Mental Health Interventions   Mental Health Discussed/Reviewed Mental Health Discussed, Refer to Social Work for counseling  Refer to Social Work for counseling regarding Substance Abuse  Nutrition Interventions   Nutrition Discussed/Reviewed Nutrition Discussed, Adding fruits and vegetables, Decreasing salt, Decreasing sugar intake  Pharmacy Interventions   Pharmacy Dicussed/Reviewed Pharmacy Topics Discussed, Medications and their functions  Safety Interventions   Safety Discussed/Reviewed Safety Discussed        Alessandra Grout [facility] Care Management Care Management Community Coordinator Direct Phone: [phone] Toll Free:  [phone] Fax: [phone]

## 2022-11-12 NOTE — Transitions of Care (Post Inpatient/ED Visit) (Signed)
   11/12/2022  Name: Keith Leblanc MRN: [id number] DOB: July 26, 1965  Today's TOC FU Call Status: Today's TOC FU Call Status:: Unsuccessful Call (2nd Attempt) Unsuccessful Call (2nd Attempt) Date: 11/12/22  Attempted to reach the patient regarding the most recent Inpatient/ED visit.  Follow Up Plan: Additional outreach attempts will be made to reach the patient to complete the Transitions of Care (Post Inpatient/ED visit) call.     Antionette Fairy, RN,BSN,CCM [facility] Care Management Care Management Community Coordinator Direct Phone: [phone] Toll Free: [phone] Fax: [phone]

## 2022-11-13 ENCOUNTER — Ambulatory Visit: Payer: BC Managed Care – PPO | Admitting: Adult Health

## 2022-11-13 ENCOUNTER — Encounter: Payer: Self-pay | Admitting: Adult Health

## 2022-11-13 VITALS — BP 132/86 | HR 90 | Temp 99.0°F | Ht 77.0 in | Wt 256.0 lb

## 2022-11-13 DIAGNOSIS — E1169 Type 2 diabetes mellitus with other specified complication: Secondary | ICD-10-CM

## 2022-11-13 DIAGNOSIS — E876 Hypokalemia: Secondary | ICD-10-CM | POA: Diagnosis not present

## 2022-11-13 DIAGNOSIS — Z7985 Long-term (current) use of injectable non-insulin antidiabetic drugs: Secondary | ICD-10-CM | POA: Diagnosis not present

## 2022-11-13 DIAGNOSIS — D649 Anemia, unspecified: Secondary | ICD-10-CM

## 2022-11-13 DIAGNOSIS — F419 Anxiety disorder, unspecified: Secondary | ICD-10-CM

## 2022-11-13 DIAGNOSIS — I499 Cardiac arrhythmia, unspecified: Secondary | ICD-10-CM

## 2022-11-13 DIAGNOSIS — F1290 Cannabis use, unspecified, uncomplicated: Secondary | ICD-10-CM

## 2022-11-13 DIAGNOSIS — F32A Depression, unspecified: Secondary | ICD-10-CM

## 2022-11-13 DIAGNOSIS — I10 Essential (primary) hypertension: Secondary | ICD-10-CM | POA: Diagnosis not present

## 2022-11-13 DIAGNOSIS — E785 Hyperlipidemia, unspecified: Secondary | ICD-10-CM

## 2022-11-13 DIAGNOSIS — F1010 Alcohol abuse, uncomplicated: Secondary | ICD-10-CM

## 2022-11-13 DIAGNOSIS — I639 Cerebral infarction, unspecified: Secondary | ICD-10-CM

## 2022-11-13 DIAGNOSIS — Z8673 Personal history of transient ischemic attack (TIA), and cerebral infarction without residual deficits: Secondary | ICD-10-CM

## 2022-11-13 LAB — IBC + FERRITIN
Ferritin: 7.3 ng/mL — ABNORMAL LOW (ref 22.0–322.0)
Iron: 12 ug/dL — ABNORMAL LOW (ref 42–165)
Saturation Ratios: 2.5 % — ABNORMAL LOW (ref 20.0–50.0)
TIBC: 477.4 ug/dL — ABNORMAL HIGH (ref 250.0–450.0)
Transferrin: 341 mg/dL (ref 212.0–360.0)

## 2022-11-13 LAB — MAGNESIUM: Magnesium: 1.4 mg/dL — ABNORMAL LOW (ref 1.5–2.5)

## 2022-11-13 LAB — CBC WITH DIFFERENTIAL/PLATELET
Basophils Absolute: 0.1 10*3/uL (ref 0.0–0.1)
Basophils Relative: 1.4 % (ref 0.0–3.0)
Eosinophils Absolute: 0.1 10*3/uL (ref 0.0–0.7)
Eosinophils Relative: 1.7 % (ref 0.0–5.0)
HCT: 28.1 % — ABNORMAL LOW (ref 39.0–52.0)
Hemoglobin: 8.6 g/dL — ABNORMAL LOW (ref 13.0–17.0)
Lymphocytes Relative: 39.5 % (ref 12.0–46.0)
Lymphs Abs: 2.1 10*3/uL (ref 0.7–4.0)
MCHC: 30.7 g/dL (ref 30.0–36.0)
MCV: 78.4 fl (ref 78.0–100.0)
Monocytes Absolute: 0.5 10*3/uL (ref 0.1–1.0)
Monocytes Relative: 8.4 % (ref 3.0–12.0)
Neutro Abs: 2.6 10*3/uL (ref 1.4–7.7)
Neutrophils Relative %: 49 % (ref 43.0–77.0)
Platelets: 335 10*3/uL (ref 150.0–400.0)
RBC: 3.58 Mil/uL — ABNORMAL LOW (ref 4.22–5.81)
RDW: 18.9 % — ABNORMAL HIGH (ref 11.5–15.5)
WBC: 5.3 10*3/uL (ref 4.0–10.5)

## 2022-11-13 LAB — BASIC METABOLIC PANEL
BUN: 19 mg/dL (ref 6–23)
CO2: 27 mEq/L (ref 19–32)
Calcium: 8.9 mg/dL (ref 8.4–10.5)
Chloride: 105 mEq/L (ref 96–112)
Creatinine, Ser: 1.74 mg/dL — ABNORMAL HIGH (ref 0.40–1.50)
GFR: 43.82 mL/min — ABNORMAL LOW (ref >60.00)
Glucose, Bld: 86 mg/dL (ref 70–99)
Potassium: 3.9 mEq/L (ref 3.5–5.1)
Sodium: 141 mEq/L (ref 135–145)

## 2022-11-13 LAB — VITAMIN B12: Vitamin B-12: 187 pg/mL — ABNORMAL LOW (ref 211–911)

## 2022-11-13 MED ORDER — ROSUVASTATIN CALCIUM 20 MG PO TABS: 20.0000 mg | ORAL_TABLET | Freq: Every day | ORAL | 3 refills | Status: DC

## 2022-11-13 NOTE — Progress Notes (Signed)
Subjective:    Patient ID: Keith Leblanc, male    DOB: 1966-05-28, 57 y.o.   MRN: [id number]  HPI 56 year old male who  has a past medical history of Blood in stool, Chicken pox, Diabetes mellitus (HCC), Erectile dysfunction, Gout, and Hypertension.  Admit Date 11/08/2022 Discharge Date 11/10/2022  He presented to the emergency room via EMS as a code stroke after acute onset of slurred speech and left-sided weakness.  On arrival his weakness had resolved but he still had residual mild speech hesitancy.  He was complaining of left-sided numbness and tingling with EMS along with a shuffling gait and dragging his left foot on EMS initial assessment.  He also reported that his left arm was feeling like "it was not my own".  CBG per EMS was 84.  He was given juice and peanut butter with improvement of CBG to 115 but his symptoms persisted.  BP was 164/82.  Workup revealed a small acute right MCA infarct.  Hospital Course  Stroke: Small acute right MCA hemic infarct Etiology: likely cardioembolic  -Stroke CT with no acute abnormality.  CTA head and neck occlusion of the right distal M3.  Irregularity with severe stenosis in proximal right M2.  MRI with small acute right MCA infarct.  Ultrasound of bilateral lower extremity did not show any evidence of DVT. -He had a 2D echo which showed an LVEF of 60 to 65%.  No regional wall motion abnormalities.  Moderate LVH.  There was dilation of the ascending aorta measuring 40 mm.  The intra trill septum was not well-visualized. -He was started on Plavix 75 mg daily for 3 weeks as well as aspirin 81 mg continuation after Plavix.  Anxiety and depression -Continued on Wellbutrin  Hypertension -Stable.  He was continued on amlodipine 10 mg daily and bisoprolol 10 mg daily  Hyperlipidemia  - started on Crestor 20 mg daily   Questionable new onset A-fib -EKG was sinus rhythm with first-degree AV block and PACs.  Patient was placed on a 30-day event  monitor at discharge  Diabetes mellitus type 2 -A1C 5.6 -He was continued on Trulicity and Lantus 30 units daily.   Hypokalemia -K initially 2.8, increased to 3.3 status post replacement  Hypomagnesemia  -Initial magnesium 1.3, improved to 1.7 after replacement  EtOH abuse/marijuana use -positive UDS for THC -Was started on thiamine, folate and multivitamin -Advised not to drink more than 2 drinks a day  Anemia -Hemoglobin 8.9 but dropped to 8.4, he is asymptomatic.  Today he reports that he seem to be have recovered fully.  He is not experiencing any slurred speech, decreased strength, numbness or tingling, or gait instability.  Has not received his cardiac monitor in the mail yet nor has he received a phone call from neurology to schedule an appointment.  Feels as though he has quite a bit of anxiety worrying "that everything is a stroke".  He has been drinking more than 2 drinks a day.  He plans to get back into using an app that he found for alcoholics that acts as a Advertising account planner.  He found this helpful and was able to quit for roughly 28 days back in April 2024.    Review of Systems  Constitutional: Negative.   HENT: Negative.    Eyes: Negative.   Respiratory: Negative.    Cardiovascular: Negative.   Gastrointestinal: Negative.   Endocrine: Negative.   Genitourinary: Negative.   Musculoskeletal: Negative.   Skin: Negative.   Allergic/Immunologic:  Negative.   Neurological: Negative.   Hematological: Negative.   Psychiatric/Behavioral:  The patient is nervous/anxious.   All other systems reviewed and are negative.  Past Medical History:  Diagnosis Date   Blood in stool    bright red blood    Chicken pox    Diabetes mellitus (HCC)    Erectile dysfunction    Gout    Hypertension     Social History   Socioeconomic History   Marital status: Single    Spouse name: Not on file   Number of children: Not on file   Years of education: Not on file   Highest education  level: Not on file  Occupational History   Not on file  Tobacco Use   Smoking status: Former   Smokeless tobacco: Never  Vaping Use   Vaping Use: Never used  Substance and Sexual Activity   Alcohol use: Yes    Comment: daily    Drug use: Not Currently   Sexual activity: Yes  Other Topics Concern   Not on file  Social History Narrative   Works for [employer]    Not married    One daughter who does not live with him    Likes to gamble, walk in the parks, travel.    Social Determinants of Health   Financial Resource Strain: Not on file  Food Insecurity: No Food Insecurity (11/12/2022)   Hunger Vital Sign    Worried About Running Out of Food in the Last Year: Never true    Ran Out of Food in the Last Year: Never true  Transportation Needs: No Transportation Needs (11/12/2022)   PRAPARE - Administrator, Civil Service (Medical): No    Lack of Transportation (Non-Medical): No  Physical Activity: Not on file  Stress: Not on file  Social Connections: Not on file  Intimate Partner Violence: Not on file    Past Surgical History:  Procedure Laterality Date   APPENDECTOMY  2004   COLONOSCOPY     TOOTH EXTRACTION  07/06/2020    Family History  Problem Relation Age of Onset   Alcohol abuse Father    Hypertension Father    Heart disease Father    Gout Father    Breast cancer Mother    Hypertension Mother    Gout Mother    Healthy Sister    Healthy Daughter    Colon cancer Neg Hx    Esophageal cancer Neg Hx    Rectal cancer Neg Hx    Stomach cancer Neg Hx     No Known Allergies  Current Outpatient Medications on File Prior to Visit  Medication Sig Dispense Refill   allopurinol (ZYLOPRIM) 300 MG tablet TAKE 1 TABLET BY MOUTH DAILY 90 tablet 3   amLODipine (NORVASC) 10 MG tablet TAKE 1 TABLET BY MOUTH DAILY 90 tablet 3   aspirin EC 81 MG tablet Take 1 tablet (81 mg total) by mouth daily. Swallow whole. 30 tablet 12   bisoprolol (ZEBETA) 10 MG tablet TAKE 2  TABLETS BY MOUTH DAILY 180 tablet 3   blood glucose meter kit and supplies Dispense based on patient and insurance preference. Use up to four times daily as directed. (FOR ICD-9 250.00, 250.01). 1 each 0   buPROPion (WELLBUTRIN XL) 300 MG 24 hr tablet TAKE 1 TABLET BY MOUTH ONCE  DAILY (Patient taking differently: Take 300 mg by mouth daily.) 30 tablet 11   clopidogrel (PLAVIX) 75 MG tablet Take 1 tablet (75 mg  total) by mouth daily. 20 tablet 0   colchicine 0.6 MG tablet TAKE 1 TABLET BY MOUTH  DAILY. WHEN HAVING FLARES  TAKE 1 TABLET BY MOUTH  TWICE DAILY (Patient taking differently: Take 0.6 mg by mouth daily.) 180 tablet 0   Dulaglutide (TRULICITY) 0.75 MG/0.5ML SOPN Inject 0.75 mg in am weekly under skin 2 mL 5   fenofibrate (TRICOR) 145 MG tablet Take 1 tablet (145 mg total) by mouth daily. 90 tablet 1   Insulin Pen Needle (B-D UF III MINI PEN NEEDLES) 31G X 5 MM MISC USE 4 TIMES DAILY AFTER MEALS AND AT BEDTIME 100 each 11   Lancets (ONETOUCH ULTRASOFT) lancets Test blood sugars as directed. Dx E11.9 100 each 12   LANTUS SOLOSTAR 100 UNIT/ML Solostar Pen INJECT SUBCUTANEOUSLY 10 TO 12  UNITS DAILY (Patient taking differently: Inject 30 Units into the skin daily.) 15 mL 3   Multiple Vitamin (MULTIVITAMIN WITH MINERALS) TABS tablet Take 1 tablet by mouth daily. 30 tablet 0   omeprazole (PRILOSEC) 40 MG capsule Take 1 capsule (40 mg total) by mouth daily. 90 capsule 3   ONE TOUCH ULTRA TEST test strip USE UP TO 4 TIMES DAILY AS DIRECTED 100 each 3   sildenafil (REVATIO) 20 MG tablet TAKE 2-5 TABLETS BY MOUTH AS NEEDED FOR SEXUAL ACTIVITY (Patient taking differently: Take 20 mg by mouth daily as needed (For ED).) 50 tablet 2   thiamine (VITAMIN B-1) 100 MG tablet Take 1 tablet (100 mg total) by mouth daily. 30 tablet 0   No current facility-administered medications on file prior to visit.    BP 132/86   Pulse 90   Temp 99 F (37.2 C) (Oral)   Ht 6\' 5"  (1.956 m)   Wt 256 lb (116.1 kg)    SpO2 98%   BMI 30.36 kg/m       Objective:   Physical Exam Vitals and nursing note reviewed.  Constitutional:      Appearance: Normal appearance.  Cardiovascular:     Rate and Rhythm: Normal rate and regular rhythm.     Pulses: Normal pulses.     Heart sounds: Normal heart sounds.  Pulmonary:     Effort: Pulmonary effort is normal.     Breath sounds: Normal breath sounds.  Musculoskeletal:        General: Normal range of motion.  Skin:    General: Skin is warm and dry.  Neurological:     General: No focal deficit present.     Mental Status: He is alert and oriented to person, place, and time.     Cranial Nerves: Cranial nerves 2-12 are intact.     Sensory: Sensation is intact.     Motor: Motor function is intact.     Coordination: Coordination is intact.     Gait: Gait is intact.     Deep Tendon Reflexes: Reflexes are normal and symmetric.  Psychiatric:        Mood and Affect: Mood normal.        Behavior: Behavior normal.        Thought Content: Thought content normal.        Judgment: Judgment normal.        Assessment & Plan:  1. Cerebrovascular accident (CVA), unspecified mechanism (HCC) - Reviewed hospital notes, discharge instructions, labs, imaging and medication changes. All questions answered to the best of my ability  - Phone number given for Neurology. He will call if he has not heard from them  in a week  - Take Plavix until gone and then continue on ASA 81 mg daily.  - CBC with Differential/Platelet; Future - Basic Metabolic Panel; Future - IBC + Ferritin; Future - Vitamin B12; Future - Magnesium; Future - Microalbumin/Creatinine Ratio, Urine; Future - Microalbumin/Creatinine Ratio, Urine - Magnesium - Vitamin B12 - IBC + Ferritin - Basic Metabolic Panel - CBC with Differential/Platelet  2. Anxiety and depression - Continue with prescribed medications  - CBC with Differential/Platelet; Future - Basic Metabolic Panel; Future - IBC + Ferritin;  Future - Vitamin B12; Future - Magnesium; Future - Microalbumin/Creatinine Ratio, Urine; Future - Microalbumin/Creatinine Ratio, Urine - Magnesium - Vitamin B12 - IBC + Ferritin - Basic Metabolic Panel - CBC with Differential/Platelet  3. Essential hypertension - Controlled. No change in medication  - CBC with Differential/Platelet; Future - Basic Metabolic Panel; Future - IBC + Ferritin; Future - Vitamin B12; Future - Magnesium; Future - Microalbumin/Creatinine Ratio, Urine; Future - Microalbumin/Creatinine Ratio, Urine - Magnesium - Vitamin B12 - IBC + Ferritin - Basic Metabolic Panel - CBC with Differential/Platelet  4. Hyperlipidemia associated with type 2 diabetes mellitus (HCC) - Continue with Crestor 20 mg daily - Refill sent in  - CBC with Differential/Platelet; Future - Basic Metabolic Panel; Future - IBC + Ferritin; Future - Vitamin B12; Future - Magnesium; Future - Microalbumin/Creatinine Ratio, Urine; Future - Microalbumin/Creatinine Ratio, Urine - Magnesium - Vitamin B12 - IBC + Ferritin - Basic Metabolic Panel - CBC with Differential/Platelet  5. Cardiac arrhythmia, unspecified cardiac arrhythmia type - Has an appointment scheduled with Cardiology  - CBC with Differential/Platelet; Future - Basic Metabolic Panel; Future - IBC + Ferritin; Future - Vitamin B12; Future - Magnesium; Future - Microalbumin/Creatinine Ratio, Urine; Future - Microalbumin/Creatinine Ratio, Urine - Magnesium - Vitamin B12 - IBC + Ferritin - Basic Metabolic Panel - CBC with Differential/Platelet  6. Long-term current use of injectable noninsulin antidiabetic medication - Follow up with Endocrinology as directed - CBC with Differential/Platelet; Future - Basic Metabolic Panel; Future - IBC + Ferritin; Future - Vitamin B12; Future - Magnesium; Future - Microalbumin/Creatinine Ratio, Urine; Future - Microalbumin/Creatinine Ratio, Urine - Magnesium - Vitamin B12 -  IBC + Ferritin - Basic Metabolic Panel - CBC with Differential/Platelet  7. Hypokalemia  - CBC with Differential/Platelet; Future - Basic Metabolic Panel; Future - IBC + Ferritin; Future - Vitamin B12; Future - Magnesium; Future - Microalbumin/Creatinine Ratio, Urine; Future - Microalbumin/Creatinine Ratio, Urine - Magnesium - Vitamin B12 - IBC + Ferritin - Basic Metabolic Panel - CBC with Differential/Platelet  8. Hypomagnesemia  - CBC with Differential/Platelet; Future - Basic Metabolic Panel; Future - IBC + Ferritin; Future - Vitamin B12; Future - Magnesium; Future - Microalbumin/Creatinine Ratio, Urine; Future - Microalbumin/Creatinine Ratio, Urine - Magnesium - Vitamin B12 - IBC + Ferritin - Basic Metabolic Panel - CBC with Differential/Platelet  9. ETOH abuse - He knows that he needs to quit. I am glad he found resources that work, encouraged to get back into the app  - He undderstands the excessive drinking can lead to future CVAs - CBC with Differential/Platelet; Future - Basic Metabolic Panel; Future - IBC + Ferritin; Future - Vitamin B12; Future - Magnesium; Future - Microalbumin/Creatinine Ratio, Urine; Future - Microalbumin/Creatinine Ratio, Urine - Magnesium - Vitamin B12 - IBC + Ferritin - Basic Metabolic Panel - CBC with Differential/Platelet  10. Marijuana use - Stop completely  - CBC with Differential/Platelet; Future - Basic Metabolic Panel; Future - IBC + Ferritin; Future -  Vitamin B12; Future - Magnesium; Future - Microalbumin/Creatinine Ratio, Urine; Future - Microalbumin/Creatinine Ratio, Urine - Magnesium - Vitamin B12 - IBC + Ferritin - Basic Metabolic Panel - CBC with Differential/Platelet  11. Anemia, unspecified type - Will check B12 and iron levels today  - CBC with Differential/Platelet; Future - Basic Metabolic Panel; Future - IBC + Ferritin; Future - Vitamin B12; Future - Magnesium; Future - Microalbumin/Creatinine  Ratio, Urine; Future - Microalbumin/Creatinine Ratio, Urine - Magnesium - Vitamin B12 - IBC + Ferritin - Basic Metabolic Panel - CBC with Differential/Platelet  Shirline Frees, NP

## 2022-11-13 NOTE — Patient Instructions (Addendum)
It was great seeing you today   We will follow up with you regarding your lab work   Please let me know if you need anything    If you have not heard from [facility] in the next week or so call them at [phone]

## 2022-11-14 ENCOUNTER — Other Ambulatory Visit: Payer: Self-pay | Admitting: Adult Health

## 2022-11-14 DIAGNOSIS — D649 Anemia, unspecified: Secondary | ICD-10-CM

## 2022-11-14 DIAGNOSIS — N289 Disorder of kidney and ureter, unspecified: Secondary | ICD-10-CM

## 2022-11-14 DIAGNOSIS — E538 Deficiency of other specified B group vitamins: Secondary | ICD-10-CM

## 2022-11-14 DIAGNOSIS — E611 Iron deficiency: Secondary | ICD-10-CM

## 2022-11-14 NOTE — Addendum Note (Signed)
Addended by: Marian Sorrow D on: 11/14/2022 04:38 PM   Modules accepted: Orders

## 2022-11-15 ENCOUNTER — Encounter: Payer: Self-pay | Admitting: Licensed Clinical Social Worker

## 2022-11-17 DIAGNOSIS — I491 Atrial premature depolarization: Secondary | ICD-10-CM | POA: Diagnosis not present

## 2022-11-17 DIAGNOSIS — R42 Dizziness and giddiness: Secondary | ICD-10-CM | POA: Diagnosis not present

## 2022-11-17 DIAGNOSIS — I440 Atrioventricular block, first degree: Secondary | ICD-10-CM | POA: Diagnosis not present

## 2022-11-17 DIAGNOSIS — I639 Cerebral infarction, unspecified: Secondary | ICD-10-CM | POA: Diagnosis not present

## 2022-11-18 ENCOUNTER — Telehealth: Payer: Self-pay | Admitting: Internal Medicine

## 2022-11-18 DIAGNOSIS — I4892 Unspecified atrial flutter: Secondary | ICD-10-CM

## 2022-11-18 NOTE — Telephone Encounter (Signed)
Contacted by [employer] regarding Keith Leblanc's arrhythmia monitor.  Per agent, patient has been in paroxysmal atrial flutter for which he was in at the time of the call.  Patient reportedly asymptomatic without any patient alerts.  I will alert the referring cardiologist.

## 2022-11-19 ENCOUNTER — Telehealth: Payer: Self-pay

## 2022-11-19 ENCOUNTER — Telehealth: Payer: Self-pay | Admitting: Student

## 2022-11-19 MED ORDER — APIXABAN 5 MG PO TABS
5.0000 mg | ORAL_TABLET | Freq: Two times a day (BID) | ORAL | 11 refills | Status: DC
Start: 2022-11-19 — End: 2022-12-16

## 2022-11-19 NOTE — Telephone Encounter (Signed)
Left voicemail for patient to return call to office. 

## 2022-11-19 NOTE — Telephone Encounter (Signed)
Error

## 2022-11-19 NOTE — Telephone Encounter (Signed)
   Cardiac Monitor Alert  Date of alert:  11/19/2022   Patient Name: Keith Leblanc  DOB: 11-16-1965  MRN: [id number]   [facility] Cardiologist: Dr. Samara Snide [facility] EP:  N/A  Monitor Information: Cardiac Event Monitor [[employer]]  Reason:  Cardiac arrhytmia Ordering provider:  Dr. Wyline Mood   Alert Atrial Fibrillation/Flutter This is the 3rd alert for this rhythm.  The patient has no hx of Atrial Fibrillation/Flutter.  The patient is on anticoagulation.  Next Cardiology Appointment   Date:  12/16/22  Provider:  Dr. Wyline Mood  The patient was contacted today.  He is asymptomatic. Dr.Berry spoke with Dr. Wyline Mood and she states Callie will start him on Eliquis    Other:   Judene Companion, LPN  7/84/6962 95:28 PM

## 2022-11-19 NOTE — Telephone Encounter (Signed)
Spoke with Dr. Wyline Mood who recommend we go ahead and start Eliquis. I personally spoke with the on-call Neurology attending (Dr. Otelia Limes) and he is also okay with [place] stopping Aspirin and Plavix and starting Eliquis instead. Called and spoke with patient. He denies any chest pain, shortness of breath, or palpitations and is feeling fine. Explained monitor results and new medication recommendation. Patient was instructed to stop Aspirin and Plavix and start Eliquis 5mg  twice daily instead. He voiced understanding and agreed. Sent prescription to [facility] in [place] as requested. Advised patient to let us know if the Eliquis is too expensive and we can help get him a 30 day free card and provide patient assistance forms.  Corrin Parker, PA-C 11/19/2022 1:00 PM

## 2022-11-20 ENCOUNTER — Ambulatory Visit: Payer: Self-pay

## 2022-11-20 NOTE — Patient Instructions (Signed)
Visit Information  Thank you for taking time to visit with me today. Please don't hesitate to contact me if I can be of assistance to you.   Following are the goals we discussed today:   Goals Addressed             This Visit's Progress    Stroke and diabetes management       Care Coordination Interventions: Counseled on increased risk of stroke due to Afib and benefits of anticoagulation for stroke prevention Reviewed importance of adherence to anticoagulant exactly as prescribed Provided education to patient about basic DM disease process Discussed plans with patient for ongoing care management follow up and provided patient with direct contact information for care management team Active listening / Reflection utilized  Emotional Support Provided Patient Goals/Self Care Activities: Reviewed Importance of taking all medications as prescribed Reviewed Importance of attending all scheduled provider appointments Screening for signs and symptoms of depression related to chronic disease state;  Assessed social determinant of health barriers Assessed for signs and symptoms of stroke          Our next appointment is by telephone on 12/05/22 at 1000 am  Please call the care guide team at [phone] if you need to cancel or reschedule your appointment.   If you are experiencing a Mental Health or Behavioral Health Crisis or need someone to talk to, please call the Suicide and Crisis Lifeline: 988   Patient verbalizes understanding of instructions and care plan provided today and agrees to view in MyChart. Active MyChart status and patient understanding of how to access instructions and care plan via MyChart confirmed with patient.     The patient has been provided with contact information for the care management team and has been advised to call with any health related questions or concerns.   Bary Leriche, RN, MSN [facility] Care Management Care Management Coordinator Direct Line  [phone]

## 2022-11-20 NOTE — Patient Outreach (Signed)
  Care Coordination   Initial Visit Note   11/20/2022 Name: Camaren Tai MRN: [id number] DOB: [birth year]  Airam Raifsnider is a 57 y.o. year old male who sees Nafziger, Kandee Keen, NP for primary care. I spoke with  Irena Reichmann by phone today. Patient has 30 day monitor.  A. Fib. Was detected and patient started on Eliquis. Discussed Eliquis and importance of medication.   What matters to the patients health and wellness today?  Stroke recovery    Goals Addressed             This Visit's Progress    Stroke and diabetes management       Care Coordination Interventions: Counseled on increased risk of stroke due to Afib and benefits of anticoagulation for stroke prevention Reviewed importance of adherence to anticoagulant exactly as prescribed Provided education to patient about basic DM disease process Discussed plans with patient for ongoing care management follow up and provided patient with direct contact information for care management team Active listening / Reflection utilized  Emotional Support Provided Patient Goals/Self Care Activities: Reviewed Importance of taking all medications as prescribed Reviewed Importance of attending all scheduled provider appointments Screening for signs and symptoms of depression related to chronic disease state;  Assessed social determinant of health barriers Assessed for signs and symptoms of stroke          SDOH assessments and interventions completed:  Yes  SDOH Interventions Today    Flowsheet Row Most Recent Value  SDOH Interventions   Housing Interventions Intervention Not Indicated  Utilities Interventions Intervention Not Indicated        Care Coordination Interventions:  Yes, provided   Follow up plan: Follow up call scheduled for June    Encounter Outcome:  Pt. Visit Completed   Bary Leriche, RN, MSN [facility] Care Management Care Management Coordinator Direct Line [phone]

## 2022-11-22 LAB — PROTHROMBIN GENE MUTATION

## 2022-11-27 ENCOUNTER — Encounter: Payer: Self-pay | Admitting: Licensed Clinical Social Worker

## 2022-11-27 ENCOUNTER — Telehealth: Payer: Self-pay | Admitting: Licensed Clinical Social Worker

## 2022-11-27 NOTE — Patient Outreach (Signed)
  Care Coordination   11/27/2022 Name: Keith Leblanc MRN: [id number] DOB: 1965/06/26   Care Coordination Outreach Attempts:  An unsuccessful telephone outreach was attempted for a scheduled appointment today.  Follow Up Plan:  Additional outreach attempts will be made to offer the patient care coordination information and services.   Encounter Outcome:  No Answer   Care Coordination Interventions:  No, not indicated    Jenel Lucks, MSW, LCSW [facility] Care Management   [facility] [email or web address] Phone [phone] 3:06 PM

## 2022-11-29 ENCOUNTER — Telehealth: Payer: Self-pay | Admitting: *Deleted

## 2022-11-29 NOTE — Progress Notes (Unsigned)
  Care Coordination Note  11/29/2022 Name: Keith Leblanc MRN: [id number] DOB: Nov 20, 1965  Keith Leblanc is a 57 y.o. year old male who is a primary care patient of Nafziger, Kandee Keen, NP and is actively engaged with the care management team. I reached out to Irena Reichmann by phone today to assist with re-scheduling a follow up visit with the Licensed Clinical Social Worker  Follow up plan: Unsuccessful telephone outreach attempt made. A HIPAA compliant phone message was left for the patient providing contact information and requesting a return call.   [facility]  Care Coordination Care Guide  Direct Dial: [phone]

## 2022-12-02 ENCOUNTER — Telehealth: Payer: Self-pay | Admitting: Internal Medicine

## 2022-12-02 LAB — FACTOR 5 LEIDEN

## 2022-12-02 NOTE — Telephone Encounter (Signed)
Pt c/o medication issue:  1. Name of Medication: Eliquis  2. How are you currently taking this medication (dosage and times per day)? 2 times a day  3. Are you having a reaction (difficulty breathing--STAT)?    4. What is your medication issue? Over the weekend both ankles  were swollen, one is down this morning. He says he is having bloody stools

## 2022-12-02 NOTE — Progress Notes (Signed)
  Care Coordination Note  12/02/2022 Name: Keith Leblanc MRN: [id number] DOB: 1966-02-16  Keith Leblanc is a 57 y.o. year old male who is a primary care patient of Nafziger, Kandee Keen, NP and is actively engaged with the care management team. I reached out to Irena Reichmann by phone today to assist with re-scheduling a follow up visit with the Licensed Clinical Social Worker  Follow up plan: Telephone appointment with care management team member scheduled for:12/03/22  [facility] Coordination Care Guide  Direct Dial: [phone]

## 2022-12-02 NOTE — Telephone Encounter (Signed)
Called pt, he states he has been having some bloody stools since starting Eliquis (prescription stated 5/28), he goes at once daily. Deep red in color, in the water, a lot. However over the weekend his ankles were swollen, since Friday. Saturday he elevated both legs, this morning one ankle is improved but not the other. He denies pain, but feels tight. Pt has appt with Dr. Wyline Mood 12/16/22. Pt has not been see in office yet. Pt was seen in the hospital by Marjie Skiff s/p stroke.

## 2022-12-02 NOTE — Telephone Encounter (Signed)
Called pt to relay Dr. Verna Czech message. Pt verbalized understanding, no further questions at this time.

## 2022-12-03 ENCOUNTER — Ambulatory Visit: Payer: Self-pay | Admitting: Licensed Clinical Social Worker

## 2022-12-05 ENCOUNTER — Ambulatory Visit: Payer: Self-pay

## 2022-12-05 NOTE — Patient Outreach (Signed)
  Care Coordination   Follow Up Visit Note   12/05/2022 Name: Jettson Crable MRN: [id number] DOB: 01-15-1966  Bernell Sigal is a 57 y.o. year old male who sees Nafziger, Kandee Keen, NP for primary care. I spoke with  Irena Reichmann by phone today.  What matters to the patients health and wellness today?  Stroke recovery    Goals Addressed             This Visit's Progress    Stroke and diabetes management       Care Coordination Interventions: Counseled on increased risk of stroke due to Afib and benefits of anticoagulation for stroke prevention Provided education to patient about basic DM disease process Discussed plans with patient for ongoing care management follow up and provided patient with direct contact information for care management team Active listening / Reflection utilized  Emotional Support Provided Patient Goals/Self Care Activities: Reviewed Importance of taking all medications as prescribed Reviewed Importance of attending all scheduled provider appointments Assessed for signs and symptoms of stroke   Patient continues to recover from stroke.  MD stopped Eliquis due to some blood in stool.  Discussed with patient.          SDOH assessments and interventions completed:  Yes     Care Coordination Interventions:  Yes, provided   Follow up plan: Follow up call scheduled for July    Encounter Outcome:  Pt. Visit Completed   Bary Leriche, RN, MSN [facility] Care Management Care Management Coordinator Direct Line [phone]

## 2022-12-05 NOTE — Patient Outreach (Signed)
  Care Coordination   Initial Visit Note   12/03/2022 Name: Keith Leblanc MRN: [id number] DOB: 1966/01/17  Keith Leblanc is a 57 y.o. year old male who sees Nafziger, Kandee Keen, NP for primary care. I spoke with  Irena Reichmann by phone today.  What matters to the patients health and wellness today?  Care Coordination    Goals Addressed             This Visit's Progress    Obtain Supportive Resources   On track    Activities and task to complete in order to accomplish goals.   Keep all upcoming appointments discussed today Continue with compliance of taking medication prescribed by Doctor Implement healthy coping skills discussed to assist with management of symptoms Continue working with [facility] care team to assist with goals identified         SDOH assessments and interventions completed:  No     Care Coordination Interventions:  Yes, provided  Interventions Today    Flowsheet Row Most Recent Value  Chronic Disease   Chronic disease during today's visit Hypertension (HTN), Diabetes, Other  [Hx of stroke]  General Interventions   General Interventions Discussed/Reviewed General Interventions Discussed, [employer], Doctor Visits  [LCSW introduced self and explained role in care coordination]  Doctor Visits Discussed/Reviewed Doctor Visits Discussed  [Reviewed ED visit]  Mental Health Interventions   Mental Health Discussed/Reviewed Mental Health Discussed, Coping Strategies  [Validation and encouragement provided. Patient unable to identify coping skills, strategies discussed. Support system assessed]  Nutrition Interventions   Nutrition Discussed/Reviewed Nutrition Discussed  Pharmacy Interventions   Pharmacy Dicussed/Reviewed Pharmacy Topics Discussed, Medication Adherence  [Pt has collaborated with specialist providers about adverse effects with medication. No barriers obtaining meds]  Safety Interventions   Safety Discussed/Reviewed Safety Discussed        Follow up plan: Follow up call scheduled for 2-4 weeks    Encounter Outcome:  Pt. Visit Completed   Jenel Lucks, MSW, LCSW [facility] Care Management [facility]  [facility] [email or web address] Phone [phone] 1:53 PM

## 2022-12-05 NOTE — Patient Instructions (Signed)
Visit Information  Thank you for taking time to visit with me today. Please don't hesitate to contact me if I can be of assistance to you.   Following are the goals we discussed today:   Goals Addressed             This Visit's Progress    Stroke and diabetes management       Care Coordination Interventions: Counseled on increased risk of stroke due to Afib and benefits of anticoagulation for stroke prevention Provided education to patient about basic DM disease process Discussed plans with patient for ongoing care management follow up and provided patient with direct contact information for care management team Active listening / Reflection utilized  Emotional Support Provided Patient Goals/Self Care Activities: Reviewed Importance of taking all medications as prescribed Reviewed Importance of attending all scheduled provider appointments Assessed for signs and symptoms of stroke   Patient continues to recover from stroke.  MD stopped Eliquis due to some blood in stool.  Discussed with patient.          Our next appointment is by telephone on 01/02/23 at 1100 am  Please call the care guide team at [phone] if you need to cancel or reschedule your appointment.   If you are experiencing a Mental Health or Behavioral Health Crisis or need someone to talk to, please call the Suicide and Crisis Lifeline: 988   Patient verbalizes understanding of instructions and care plan provided today and agrees to view in MyChart. Active MyChart status and patient understanding of how to access instructions and care plan via MyChart confirmed with patient.     The patient has been provided with contact information for the care management team and has been advised to call with any health related questions or concerns.   Bary Leriche, RN, MSN [facility] Care Management Care Management Coordinator Direct Line [phone]

## 2022-12-05 NOTE — Patient Instructions (Signed)
Visit Information  Thank you for taking time to visit with me today. Please don't hesitate to contact me if I can be of assistance to you.   Following are the goals we discussed today:   Goals Addressed             This Visit's Progress    Obtain Supportive Resources   On track    Activities and task to complete in order to accomplish goals.   Keep all upcoming appointments discussed today Continue with compliance of taking medication prescribed by Doctor Implement healthy coping skills discussed to assist with management of symptoms Continue working with [facility] care team to assist with goals identified         Our next appointment is by telephone on 07/2 at 3:30 PM  Please call the care guide team at [phone] if you need to cancel or reschedule your appointment.   If you are experiencing a Mental Health or Behavioral Health Crisis or need someone to talk to, please call the Suicide and Crisis Lifeline: 988 call 911   Patient verbalizes understanding of instructions and care plan provided today and agrees to view in MyChart. Active MyChart status and patient understanding of how to access instructions and care plan via MyChart confirmed with patient.     Jenel Lucks, MSW, LCSW [facility] Care Management [place]  [facility] [email or web address] Phone [phone] 1:54 PM

## 2022-12-10 ENCOUNTER — Encounter: Payer: Self-pay | Admitting: Adult Health

## 2022-12-12 DIAGNOSIS — Z0279 Encounter for issue of other medical certificate: Secondary | ICD-10-CM

## 2022-12-16 ENCOUNTER — Encounter: Payer: Self-pay | Admitting: Internal Medicine

## 2022-12-16 ENCOUNTER — Ambulatory Visit: Payer: BC Managed Care – PPO | Attending: Internal Medicine | Admitting: Internal Medicine

## 2022-12-16 VITALS — BP 140/82 | HR 66 | Ht 77.0 in | Wt 254.6 lb

## 2022-12-16 DIAGNOSIS — K625 Hemorrhage of anus and rectum: Secondary | ICD-10-CM | POA: Diagnosis not present

## 2022-12-16 DIAGNOSIS — I4891 Unspecified atrial fibrillation: Secondary | ICD-10-CM | POA: Diagnosis not present

## 2022-12-16 DIAGNOSIS — I10 Essential (primary) hypertension: Secondary | ICD-10-CM

## 2022-12-16 NOTE — Progress Notes (Signed)
Cardiology Office Note:    Date:  12/16/2022   ID:  Keith Leblanc, DOB May 08, 1966, MRN [id number]  PCP:  Shirline Frees, NP   [facility] Providers Cardiologist:  None     Referring MD: Shirline Frees, NP   No chief complaint on file. CVA  History of Present Illness:    Keith Leblanc is a 57 y.o. male with a hx of hypertension, type 2 diabetes mellitus, and gout referral for hospital FU. He was seen in the hospital post CVA. Small right MCA. Tele was not clearly afib at the time. He had a cardiac monitor  and I was alerted that his monitor showed afib. He was then started on eliquis. After starting, he noted BRB in his toilet. He was instructed to stop his eliquis. The bleeding ceased. He also noted leg swelling that resolved.  He comes in today, he denies any palpitations. No signs of OSA. He has intermittent SOB. No signs of CHF.  Past Medical History:  Diagnosis Date   Blood in stool    bright red blood    Chicken pox    Diabetes mellitus (HCC)    Erectile dysfunction    Gout    Hypertension     Past Surgical History:  Procedure Laterality Date   APPENDECTOMY  2004   COLONOSCOPY     TOOTH EXTRACTION  07/06/2020    Current Medications: Current Meds  Medication Sig   allopurinol (ZYLOPRIM) 300 MG tablet TAKE 1 TABLET BY MOUTH DAILY   amLODipine (NORVASC) 10 MG tablet TAKE 1 TABLET BY MOUTH DAILY   bisoprolol (ZEBETA) 10 MG tablet TAKE 2 TABLETS BY MOUTH DAILY   blood glucose meter kit and supplies Dispense based on patient and insurance preference. Use up to four times daily as directed. (FOR ICD-9 250.00, 250.01).   buPROPion (WELLBUTRIN XL) 300 MG 24 hr tablet TAKE 1 TABLET BY MOUTH ONCE  DAILY (Patient taking differently: Take 300 mg by mouth daily.)   colchicine 0.6 MG tablet TAKE 1 TABLET BY MOUTH  DAILY. WHEN HAVING FLARES  TAKE 1 TABLET BY MOUTH  TWICE DAILY (Patient taking differently: Take 0.6 mg by mouth daily.)   Dulaglutide (TRULICITY) 0.75  MG/0.5ML SOPN Inject 0.75 mg in am weekly under skin   fenofibrate (TRICOR) 145 MG tablet Take 1 tablet (145 mg total) by mouth daily.   Insulin Pen Needle (B-D UF III MINI PEN NEEDLES) 31G X 5 MM MISC USE 4 TIMES DAILY AFTER MEALS AND AT BEDTIME   Lancets (ONETOUCH ULTRASOFT) lancets Test blood sugars as directed. Dx E11.9   LANTUS SOLOSTAR 100 UNIT/ML Solostar Pen INJECT SUBCUTANEOUSLY 10 TO 12  UNITS DAILY (Patient taking differently: Inject 30 Units into the skin daily.)   Multiple Vitamin (MULTIVITAMIN WITH MINERALS) TABS tablet Take 1 tablet by mouth daily.   omeprazole (PRILOSEC) 40 MG capsule Take 1 capsule (40 mg total) by mouth daily.   ONE TOUCH ULTRA TEST test strip USE UP TO 4 TIMES DAILY AS DIRECTED   rosuvastatin (CRESTOR) 20 MG tablet Take 1 tablet (20 mg total) by mouth daily.   sildenafil (REVATIO) 20 MG tablet TAKE 2-5 TABLETS BY MOUTH AS NEEDED FOR SEXUAL ACTIVITY (Patient taking differently: Take 20 mg by mouth daily as needed (For ED).)   thiamine (VITAMIN B-1) 100 MG tablet Take 1 tablet (100 mg total) by mouth daily.     Allergies:   Patient has no known allergies.   Social History   Socioeconomic History  Marital status: Single    Spouse name: Not on file   Number of children: Not on file   Years of education: Not on file   Highest education level: Not on file  Occupational History   Not on file  Tobacco Use   Smoking status: Former   Smokeless tobacco: Never  Vaping Use   Vaping Use: Never used  Substance and Sexual Activity   Alcohol use: Yes    Comment: daily    Drug use: Not Currently   Sexual activity: Yes  Other Topics Concern   Not on file  Social History Narrative   Works for [employer]    Not married    One daughter who does not live with him    Likes to gamble, walk in the parks, travel.    Social Determinants of Health   Financial Resource Strain: Not on file  Food Insecurity: No Food Insecurity (11/12/2022)   Hunger Vital Sign    Worried  About Running Out of Food in the Last Year: Never true    Ran Out of Food in the Last Year: Never true  Transportation Needs: No Transportation Needs (11/12/2022)   PRAPARE - Administrator, Civil Service (Medical): No    Lack of Transportation (Non-Medical): No  Physical Activity: Not on file  Stress: Not on file  Social Connections: Not on file     Family History: The patient's family history includes Alcohol abuse in his father; Breast cancer in his mother; Gout in his father and mother; Healthy in his daughter and sister; Heart disease in his father; Hypertension in his father and mother. There is no history of Colon cancer, Esophageal cancer, Rectal cancer, or Stomach cancer.  ROS:   Please see the history of present illness.     All other systems reviewed and are negative.  EKGs/Labs/Other Studies Reviewed:    The following studies were reviewed today:  Recent Labs: 11/08/2022: ALT 18 11/09/2022: TSH 1.813 11/13/2022: BUN 19; Creatinine, Ser 1.74; Hemoglobin 8.6; Magnesium 1.4; Platelets 335.0; Potassium 3.9; Sodium 141   Recent Lipid Panel    Component Value Date/Time   CHOL 199 11/09/2022 0045   TRIG 135 11/09/2022 0045   HDL 76 11/09/2022 0045   CHOLHDL 2.6 11/09/2022 0045   VLDL 27 11/09/2022 0045   LDLCALC 96 11/09/2022 0045   LDLDIRECT 72.0 09/12/2021 1007     Risk Assessment/Calculations:    CHA2DS2-VASc Score = 4   This indicates a 4.8% annual risk of stroke. The patient's score is based upon: CHF History: 0 HTN History: 1 Diabetes History: 1 Stroke History: 2 Vascular Disease History: 0 Age Score: 0 Gender Score: 0       Physical Exam:    VS:  BP (!) 140/82   Pulse 66   Ht 6\' 5"  (1.956 m)   Wt 254 lb 9.6 oz (115.5 kg)   SpO2 100%   BMI 30.19 kg/m     Wt Readings from Last 3 Encounters:  12/16/22 254 lb 9.6 oz (115.5 kg)  11/13/22 256 lb (116.1 kg)  11/09/22 246 lb 4.1 oz (111.7 kg)     GEN:  Well nourished, well developed  in no acute distress HEENT: Normal NECK: No JVD; No carotid bruits LYMPHATICS: No lymphadenopathy CARDIAC: RRR, no murmurs, rubs, gallops RESPIRATORY:  Clear to auscultation without rales, wheezing or rhonchi  ABDOMEN: Soft, non-tender, non-distended MUSCULOSKELETAL:  No edema; No deformity  SKIN: Warm and dry NEUROLOGIC:  Alert and oriented  x 3 PSYCHIATRIC:  Normal affect   ASSESSMENT:    Paroxysmal Atrial Fibrillation: Was seen in the hospital for a small CVA. He had sinus with PACs mostly. He wore a cardiac monitor on dispo and we were called by [employer] stating he had an episode of afib. He was recommended to start eliquis.  - had BRBPR; on eliquis, will consult GI for EGD/colonscopy to assess the source of bleedinig - continue to hold eliquis 5 mg BID - pending his cardiac monitor, told him to send it in today.  - regular rhythm today - continue bisoprolol 20 mg daily  Post CVA: holding eliquis per above. Cont crestor.   HTN: continue norvasc 10 mg daily  PLAN:    In order of problems listed above:  Pending cardiac monitor Hold eliquis 5 mg BID GI consult for bleeding Follow up 3 months      Medication Adjustments/Labs and Tests Ordered: Current medicines are reviewed at length with the patient today.  Concerns regarding medicines are outlined above.  Orders Placed This Encounter  Procedures   EKG 12-Lead   No orders of the defined types were placed in this encounter.   There are no Patient Instructions on file for this visit.   Signed, Maisie Fus, MD  12/16/2022 11:18 AM    [facility]

## 2022-12-16 NOTE — Patient Instructions (Addendum)
Medication Instructions:  NO CHANGES  *If you need a refill on your cardiac medications before your next appointment, please call your pharmacy*     Follow-Up: At [facility], you and your health needs are our priority.  As part of our continuing mission to provide you with exceptional heart care, we have created designated Provider Care Teams.  These Care Teams include your primary Cardiologist (physician) and Advanced Practice Providers (APPs -  Physician Assistants and Nurse Practitioners) who all work together to provide you with the care you need, when you need it.  We recommend signing up for the patient portal called "MyChart".  Sign up information is provided on this After Visit Summary.  MyChart is used to connect with patients for Virtual Visits (Telemedicine).  Patients are able to view lab/test results, encounter notes, upcoming appointments, etc.  Non-urgent messages can be sent to your provider as well.   To learn more about what you can do with MyChart, go to [email or web address].    Your next appointment:    3 months with Dr. Carolan Clines  Other Instructions  You have been referred to [facility]  If you have not received a call for an appointment by 6/28, please contact their office.   [facility] [address] [place] [address] [place],  [place]  [address] Phone: [phone]    Please mail back heart monitor as soon as able.

## 2022-12-17 ENCOUNTER — Other Ambulatory Visit (INDEPENDENT_AMBULATORY_CARE_PROVIDER_SITE_OTHER): Payer: BC Managed Care – PPO

## 2022-12-17 DIAGNOSIS — Z7985 Long-term (current) use of injectable non-insulin antidiabetic drugs: Secondary | ICD-10-CM

## 2022-12-17 DIAGNOSIS — F1010 Alcohol abuse, uncomplicated: Secondary | ICD-10-CM

## 2022-12-17 DIAGNOSIS — E611 Iron deficiency: Secondary | ICD-10-CM

## 2022-12-17 DIAGNOSIS — D649 Anemia, unspecified: Secondary | ICD-10-CM | POA: Diagnosis not present

## 2022-12-17 DIAGNOSIS — N289 Disorder of kidney and ureter, unspecified: Secondary | ICD-10-CM | POA: Diagnosis not present

## 2022-12-17 DIAGNOSIS — I10 Essential (primary) hypertension: Secondary | ICD-10-CM

## 2022-12-17 DIAGNOSIS — E538 Deficiency of other specified B group vitamins: Secondary | ICD-10-CM

## 2022-12-17 DIAGNOSIS — F419 Anxiety disorder, unspecified: Secondary | ICD-10-CM | POA: Diagnosis not present

## 2022-12-17 DIAGNOSIS — F1290 Cannabis use, unspecified, uncomplicated: Secondary | ICD-10-CM

## 2022-12-17 DIAGNOSIS — E785 Hyperlipidemia, unspecified: Secondary | ICD-10-CM

## 2022-12-17 DIAGNOSIS — I639 Cerebral infarction, unspecified: Secondary | ICD-10-CM | POA: Diagnosis not present

## 2022-12-17 DIAGNOSIS — E1169 Type 2 diabetes mellitus with other specified complication: Secondary | ICD-10-CM

## 2022-12-17 DIAGNOSIS — F32A Depression, unspecified: Secondary | ICD-10-CM

## 2022-12-17 DIAGNOSIS — I499 Cardiac arrhythmia, unspecified: Secondary | ICD-10-CM

## 2022-12-17 DIAGNOSIS — E876 Hypokalemia: Secondary | ICD-10-CM

## 2022-12-17 LAB — CBC
HCT: 32.5 % — ABNORMAL LOW (ref 39.0–52.0)
Hemoglobin: 10.2 g/dL — ABNORMAL LOW (ref 13.0–17.0)
MCHC: 31.2 g/dL (ref 30.0–36.0)
MCV: 86.4 fl (ref 78.0–100.0)
Platelets: 317 10*3/uL (ref 150.0–400.0)
RBC: 3.77 Mil/uL — ABNORMAL LOW (ref 4.22–5.81)
RDW: 26 % — ABNORMAL HIGH (ref 11.5–15.5)
WBC: 3 10*3/uL — ABNORMAL LOW (ref 4.0–10.5)

## 2022-12-17 LAB — IBC + FERRITIN
Ferritin: 10.8 ng/mL — ABNORMAL LOW (ref 22.0–322.0)
Iron: 127 ug/dL (ref 42–165)
Saturation Ratios: 28.9 % (ref 20.0–50.0)
TIBC: 439.6 ug/dL (ref 250.0–450.0)
Transferrin: 314 mg/dL (ref 212.0–360.0)

## 2022-12-17 LAB — BASIC METABOLIC PANEL
BUN: 19 mg/dL (ref 6–23)
CO2: 27 mEq/L (ref 19–32)
Calcium: 9.2 mg/dL (ref 8.4–10.5)
Chloride: 104 mEq/L (ref 96–112)
Creatinine, Ser: 1.47 mg/dL (ref 0.40–1.50)
GFR: 53.61 mL/min — ABNORMAL LOW (ref >60.00)
Glucose, Bld: 79 mg/dL (ref 70–99)
Potassium: 3.5 mEq/L (ref 3.5–5.1)
Sodium: 141 mEq/L (ref 135–145)

## 2022-12-17 LAB — MAGNESIUM: Magnesium: 1.4 mg/dL — ABNORMAL LOW (ref 1.5–2.5)

## 2022-12-17 LAB — MICROALBUMIN / CREATININE URINE RATIO
Creatinine,U: 228.2 mg/dL
Microalb Creat Ratio: 2.8 mg/g (ref 0.0–30.0)
Microalb, Ur: 6.4 mg/dL — ABNORMAL HIGH (ref 0.0–1.9)

## 2022-12-17 LAB — VITAMIN B12: Vitamin B-12: 165 pg/mL — ABNORMAL LOW (ref 211–911)

## 2022-12-19 NOTE — Telephone Encounter (Signed)
Pt notified that FMLA ppw was faxed 12/13/22 at 9:34 with confirmation. Pt verbalized understanding and was advise to call us if there are any issues.

## 2022-12-21 ENCOUNTER — Other Ambulatory Visit: Payer: Self-pay | Admitting: Adult Health

## 2022-12-24 ENCOUNTER — Ambulatory Visit: Payer: Self-pay | Admitting: Licensed Clinical Social Worker

## 2022-12-24 ENCOUNTER — Ambulatory Visit: Payer: BC Managed Care – PPO | Attending: Internal Medicine

## 2022-12-24 DIAGNOSIS — I491 Atrial premature depolarization: Secondary | ICD-10-CM

## 2022-12-24 DIAGNOSIS — R42 Dizziness and giddiness: Secondary | ICD-10-CM

## 2022-12-24 DIAGNOSIS — I639 Cerebral infarction, unspecified: Secondary | ICD-10-CM

## 2022-12-24 DIAGNOSIS — I440 Atrioventricular block, first degree: Secondary | ICD-10-CM

## 2022-12-25 NOTE — Patient Outreach (Signed)
  Care Coordination   Follow Up Visit Note   12/24/2022 Name: Keith Leblanc MRN: [id number] DOB: [birth year]  Keith Leblanc is a 57 y.o. year old male who sees Nafziger, Kandee Keen, NP for primary care. I spoke with  Irena Reichmann by phone today.  What matters to the patients health and wellness today?  FMLA and Symptom Management    Goals Addressed             This Visit's Progress    Obtain Supportive Resources   On track    Activities and task to complete in order to accomplish goals.   Keep all upcoming appointments discussed today Continue with compliance of taking medication prescribed by Doctor Implement healthy coping skills discussed to assist with management of symptoms Continue working with [facility] care team to assist with goals identified F/up with PCP office regarding FMLA ppwk         SDOH assessments and interventions completed:  No     Care Coordination Interventions:  Yes, provided  Interventions Today    Flowsheet Row Most Recent Value  Chronic Disease   Chronic disease during today's visit Hypertension (HTN), Diabetes  General Interventions   General Interventions Discussed/Reviewed General Interventions Reviewed, Doctor Visits  Doctor Visits Discussed/Reviewed Doctor Visits Reviewed  Mental Health Interventions   Mental Health Discussed/Reviewed Mental Health Reviewed, Coping Strategies  Nutrition Interventions   Nutrition Discussed/Reviewed Nutrition Reviewed  Pharmacy Interventions   Pharmacy Dicussed/Reviewed Pharmacy Topics Reviewed       Follow up plan: Follow up call scheduled for 2-4 weeks    Encounter Outcome:  Pt. Visit Completed   Jenel Lucks, MSW, LCSW [facility] Care Management [facility]  [facility] [email or web address] Phone [phone] 11:53 PM

## 2022-12-25 NOTE — Patient Instructions (Signed)
Visit Information  Thank you for taking time to visit with me today. Please don't hesitate to contact me if I can be of assistance to you.   Following are the goals we discussed today:   Goals Addressed             This Visit's Progress    Obtain Supportive Resources   On track    Activities and task to complete in order to accomplish goals.   Keep all upcoming appointments discussed today Continue with compliance of taking medication prescribed by Doctor Implement healthy coping skills discussed to assist with management of symptoms Continue working with [facility] care team to assist with goals identified F/up with PCP office regarding FMLA ppwk         Our next appointment is by telephone on 07/23 at 3:30 PM  Please call the care guide team at [phone] if you need to cancel or reschedule your appointment.   If you are experiencing a Mental Health or Behavioral Health Crisis or need someone to talk to, please call the Suicide and Crisis Lifeline: 988 call 911   Patient verbalizes understanding of instructions and care plan provided today and agrees to view in MyChart. Active MyChart status and patient understanding of how to access instructions and care plan via MyChart confirmed with patient.     Jenel Lucks, MSW, LCSW [facility] Care Management [place]  [facility] [email or web address] Phone [phone] 11:54 PM

## 2023-01-01 ENCOUNTER — Ambulatory Visit (INDEPENDENT_AMBULATORY_CARE_PROVIDER_SITE_OTHER): Payer: BC Managed Care – PPO | Admitting: Gastroenterology

## 2023-01-01 ENCOUNTER — Encounter: Payer: Self-pay | Admitting: Gastroenterology

## 2023-01-01 VITALS — BP 138/88 | HR 68 | Ht 77.0 in | Wt 250.0 lb

## 2023-01-01 DIAGNOSIS — K648 Other hemorrhoids: Secondary | ICD-10-CM | POA: Diagnosis not present

## 2023-01-01 DIAGNOSIS — Z86010 Personal history of colonic polyps: Secondary | ICD-10-CM | POA: Diagnosis not present

## 2023-01-01 DIAGNOSIS — D509 Iron deficiency anemia, unspecified: Secondary | ICD-10-CM

## 2023-01-01 DIAGNOSIS — E538 Deficiency of other specified B group vitamins: Secondary | ICD-10-CM | POA: Diagnosis not present

## 2023-01-01 NOTE — Patient Instructions (Signed)
_______________________________________________________  If your blood pressure at your visit was 140/90 or greater, please contact your primary care physician to follow up on this.  _______________________________________________________  If you are age 55 or older, your body mass index should be between 23-30. Your Body mass index is 29.65 kg/m. If this is out of the aforementioned range listed, please consider follow up with your Primary Care Provider.  If you are age 49 or younger, your body mass index should be between 19-25. Your Body mass index is 29.65 kg/m. If this is out of the aformentioned range listed, please consider follow up with your Primary Care Provider.   ________________________________________________________  The [place] [facility] providers would like to encourage you to use [facility] to communicate with providers for non-urgent requests or questions.  Due to long hold times on the telephone, sending your provider a message by [facility] may be a faster and more efficient way to get a response.  Please allow 48 business hours for a response.  Please remember that this is for non-urgent requests.  _______________________________________________________  It has been recommended to you by your physician that you have a(n) colonoscopy completed. Per your request, we did not schedule the procedure(s) today. Please contact our office at [phone] should you decide to have the procedure completed. You will be scheduled for a pre-visit and procedure at that time.   Please increase your Iron to TWICE a day.   Start the ELIQUIS 24 hours after using the hemorrhoid suppositories.  Please call if rectal bleeding returns.   It was a pleasure to see you today!  Thank you for trusting me with your gastrointestinal care!

## 2023-01-01 NOTE — Progress Notes (Addendum)
[facility] Consult Note:  History: Keith Leblanc 01/01/2023  Referring provider: Shirline Frees, NP  Reason for consult/chief complaint: Rectal Bleeding (Was put on Eliquis for a mild stroke, developed rectal bleeding. Now resolved but not taking Eliquis.)   Subjective  HPI: Keith Leblanc was referred to [place] by primary care and cardiology after recent onset of painless rectal bleeding.  He was admitted to the hospital 11/08/2022 with a small right MCA infarct.  Cardiology consult obtained, and subsequent cardiac event monitor revealed atrial fibrillation.  He was put on Eliquis 5 mg twice daily, then advised to stop it when he developed rectal bleeding.  Last cardiology office visit on 12/16/2022. Keith Leblanc was seen by Keith Leblanc at this office for rectal bleeding leading to a colonoscopy in November 2019.  This revealed 2 diminutive polyps (TA and SSP), diverticulosis and internal hemorrhoids.  EGD at that time for reflux revealed grade a esophagitis, small hiatal hernia, mild nonspecific patchy gastric erythema with biopsies negative for H. pylori.  Duodenal polypoid mucosa-biopsies normal. He has had anemia dating back to at least 2021, at times microcytic. ______________________  Today, he complains of rectal bleeding that began after using Eliquis.  He has since stopped taking Eliquis. H states that the bleeding started a week after Eliquis use and has not recurred since stopping the medicine.Marland Kitchen He denies any accompanying rectal pain. He reports compliance and good tolerance of Omeprazole 40 mg daily.   He reports typically having normal BM without any complications. He reports taking iron supplement and B12 supplements once daily.  We reviewed his last visits with Keith Leblanc and discussed his previous complains and his previous lab work.   Keith Leblanc denies diarrhea, constipation, nausea, black stool, vomiting, abdominal pain, bloating, unintentional weight loss,  reflux, dysphagia.  ROS:  Review of Systems  Constitutional:  Negative for appetite change and fever.  HENT:  Negative for trouble swallowing.   Respiratory:  Negative for cough and shortness of breath.   Cardiovascular:  Negative for chest pain.  Gastrointestinal:  Positive for anal bleeding and blood in stool. Negative for abdominal distention, abdominal pain, constipation, diarrhea, nausea, rectal pain and vomiting.  Genitourinary:  Negative for dysuria.  Musculoskeletal:  Negative for back pain.  Skin:  Negative for rash.  Neurological:  Negative for weakness.  All other systems reviewed and are negative.    Past Medical History: Past Medical History:  Diagnosis Date   Blood in stool    bright red blood    Chicken pox    Diabetes mellitus (HCC)    Erectile dysfunction    Gout    Hypertension    Stroke [facility])      Past Surgical History: Past Surgical History:  Procedure Laterality Date   APPENDECTOMY  2004   COLONOSCOPY     TOOTH EXTRACTION  07/06/2020     Family History: Family History  Problem Relation Age of Onset   Alcohol abuse Father    Hypertension Father    Heart disease Father    Gout Father    Breast cancer Mother    Hypertension Mother    Gout Mother    Healthy Sister    Healthy Daughter    Colon cancer Neg Hx    Esophageal cancer Neg Hx    Rectal cancer Neg Hx    Stomach cancer Neg Hx     Social History: Social History   Socioeconomic History   Marital status: Single    Spouse name: Not on  file   Number of children: Not on file   Years of education: Not on file   Highest education level: Not on file  Occupational History   Not on file  Tobacco Use   Smoking status: Former   Smokeless tobacco: Never  Vaping Use   Vaping Use: Never used  Substance and Sexual Activity   Alcohol use: Yes    Comment: several times a week   Drug use: Never   Sexual activity: Yes  Other Topics Concern   Not on file  Social History Narrative    Works for [employer]    Not married    One daughter who does not live with him    Likes to gamble, walk in the parks, travel.    Social Determinants of Health   Financial Resource Strain: Not on file  Food Insecurity: No Food Insecurity (11/12/2022)   Hunger Vital Sign    Worried About Running Out of Food in the Last Year: Never true    Ran Out of Food in the Last Year: Never true  Transportation Needs: No Transportation Needs (11/12/2022)   PRAPARE - Administrator, Civil Service (Medical): No    Lack of Transportation (Non-Medical): No  Physical Activity: Not on file  Stress: Not on file  Social Connections: Not on file    Allergies: No Known Allergies  Outpatient Meds: Current Outpatient Medications  Medication Sig Dispense Refill   allopurinol (ZYLOPRIM) 300 MG tablet TAKE 1 TABLET BY MOUTH DAILY 90 tablet 3   amLODipine (NORVASC) 10 MG tablet TAKE 1 TABLET BY MOUTH DAILY 90 tablet 3   bisoprolol (ZEBETA) 10 MG tablet TAKE 2 TABLETS BY MOUTH DAILY 180 tablet 3   blood glucose meter kit and supplies Dispense based on patient and insurance preference. Use up to four times daily as directed. (FOR ICD-9 250.00, 250.01). 1 each 0   buPROPion (WELLBUTRIN XL) 300 MG 24 hr tablet TAKE 1 TABLET BY MOUTH ONCE  DAILY (Patient taking differently: Take 300 mg by mouth daily.) 30 tablet 11   colchicine 0.6 MG tablet TAKE 1 TABLET BY MOUTH  DAILY. WHEN HAVING FLARES  TAKE 1 TABLET BY MOUTH  TWICE DAILY (Patient taking differently: Take 0.6 mg by mouth daily.) 180 tablet 0   Dulaglutide (TRULICITY) 0.75 MG/0.5ML SOPN Inject 0.75 mg in am weekly under skin 2 mL 5   fenofibrate (TRICOR) 145 MG tablet Take 1 tablet (145 mg total) by mouth daily. 90 tablet 1   Insulin Pen Needle (B-D UF III MINI PEN NEEDLES) 31G X 5 MM MISC USE 4 TIMES DAILY AFTER MEALS AND AT BEDTIME 100 each 11   Lancets (ONETOUCH ULTRASOFT) lancets Test blood sugars as directed. Dx E11.9 100 each 12   LANTUS SOLOSTAR 100  UNIT/ML Solostar Pen INJECT SUBCUTANEOUSLY 10 TO 12  UNITS DAILY (Patient taking differently: Inject 30 Units into the skin daily.) 15 mL 3   Multiple Vitamin (MULTIVITAMIN WITH MINERALS) TABS tablet Take 1 tablet by mouth daily. 30 tablet 0   Multiple Vitamin (MULTIVITAMIN) tablet Take 1 tablet by mouth daily.     omeprazole (PRILOSEC) 40 MG capsule TAKE 1 CAPSULE BY MOUTH DAILY 90 capsule 3   ONE TOUCH ULTRA TEST test strip USE UP TO 4 TIMES DAILY AS DIRECTED 100 each 3   sildenafil (REVATIO) 20 MG tablet TAKE 2-5 TABLETS BY MOUTH AS NEEDED FOR SEXUAL ACTIVITY (Patient taking differently: Take 20 mg by mouth daily as needed (For ED).)  50 tablet 2   thiamine (VITAMIN B-1) 100 MG tablet Take 1 tablet (100 mg total) by mouth daily. 30 tablet 0   No current facility-administered medications for this visit.      ___________________________________________________________________ Objective   Exam:  BP 138/88   Pulse 68   Ht 6\' 5"  (1.956 m)   Wt 250 lb (113.4 kg)   BMI 29.65 kg/m  Wt Readings from Last 3 Encounters:  01/01/23 250 lb (113.4 kg)  12/16/22 254 lb 9.6 oz (115.5 kg)  11/13/22 256 lb (116.1 kg)   General: well-appearing   Eyes: sclera anicteric, no redness ENT: oral mucosa moist without lesions, no cervical or supraclavicular lymphadenopathy CV: RRR, no JVD, no peripheral edema Resp: clear to auscultation bilaterally, normal RR and effort noted GI: soft, no tenderness, with active bowel sounds. No guarding or palpable organomegaly noted. Skin; warm and dry, no rash or jaundice noted Neuro: awake, alert and oriented x 3. Normal gross motor function and fluent speech Rectal Exam: Heightened sphincter tone, DRE otherwise normal  anoscopy, internal hemorrhoids (primarily right-sided)  Labs:     Latest Ref Rng & Units 12/17/2022    8:42 AM 11/13/2022   11:00 AM 11/10/2022    3:21 AM  CBC  WBC 4.0 - 10.5 K/uL 3.0  5.3  4.8   Hemoglobin 13.0 - 17.0 g/dL 82.9  8.6  C 8.4    Hematocrit 39.0 - 52.0 % 32.5  28.1  27.7   Platelets 150.0 - 400.0 K/uL 317.0  335.0  298     C Corrected result   Iron/TIBC/Ferritin/ %Sat    Component Value Date/Time   IRON 127 12/17/2022 0842   TIBC 439.6 12/17/2022 0842   FERRITIN 10.8 (L) 12/17/2022 0842   IRONPCTSAT 28.9 12/17/2022 0842      Latest Ref Rng & Units 12/17/2022    8:42 AM 11/13/2022   11:00 AM 11/10/2022    3:21 AM  CMP  Glucose 70 - 99 mg/dL 79  86  80   BUN 6 - 23 mg/dL 19  19  11    Creatinine 0.40 - 1.50 mg/dL 5.62  1.30  8.65   Sodium 135 - 145 mEq/L 141  141  137   Potassium 3.5 - 5.1 mEq/L 3.5  3.9  3.3   Chloride 96 - 112 mEq/L 104  105  105   CO2 19 - 32 mEq/L 27  27  22    Calcium 8.4 - 10.5 mg/dL 9.2  8.9  8.5    GFR fluctuates high 40s to low 50s  B12 decreased at 125 in March 2023    level decreased at 187 in May of this year and 164 in June of this year Radiologic Studies:   Assessment: Bleeding internal hemorrhoids  B12 deficiency  Iron deficiency anemia, unspecified iron deficiency anemia type  Personal history of colonic polyps  Bleeding internal hemorrhoids-self-limited episode a week after starting anticoagulation, then has not recurred since stopping it.  However, he is at high risk for recurrent stroke and has been off anticoagulation for weeks. He has chronic anemia from both B12 and iron deficiency, and had colon polyps 5 years ago and is due for surveillance.  I offered him a colonoscopy later this month for further evaluation of the anemia and for polyp surveillance, but he did not wish to proceed with that at this time.  I recommended he do that by the end of this calendar year.  (His procedure could not presently be  done in the [facility] having had a CVA within the last 3 months, but could potentially be done there in the future if he does not have recurrent CVA) Also recommended increasing his iron supplement to twice daily and communicate with primary care about the possibility of  getting at least a couple of B12 injections to bring his level up faster.  If his ferritin does not improve soon with twice daily iron, IV iron would also likely help him.  Regarding the hemorrhoids, I recommend that he start using once daily Preparation H suppository today and then resume his Eliquis the following day with dosing per his cardiologist.  If bleeding recurs, increase the suppository to twice daily.  If bleeding persist despite that, he should have hemorrhoidal banding.  I had an initial discussion about hemorrhoidal banding, showed him diagrams of the anatomy and how the procedure is done and gave him a brochure.  Lastly, I asked Keith Leblanc to contact us within 2 weeks to give an update on his condition so we can proceed accordingly.   Thank you for the courtesy of this consult.  Please call me with any questions or concerns.   - Amada Jupiter, MD    [facility]   Ladona Mow M Kadhim,acting as a scribe for Charlie Pitter III, MD.,have documented all relevant documentation on the behalf of Sherrilyn Rist, MD,as directed by  Sherrilyn Rist, MD while in the presence of Sherrilyn Rist, MD.   Marvis Repress III, MD, have reviewed all documentation for this visit. The documentation on 01/01/23 for the exam, diagnosis, procedures, and orders are all accurate and complete.    CC: Referring provider noted above

## 2023-01-01 NOTE — Progress Notes (Deleted)
[facility] Consult Note:  History: Keith Leblanc 01/01/2023  Referring provider: Shirline Frees, NP  Reason for consult/chief complaint: No chief complaint on file.   Subjective  HPI: Keith Leblanc was referred to [place] by primary care and cardiology after recent onset of painless rectal bleeding.  He was admitted to the hospital 11/08/2022 with a small right MCA infarct.  Cardiology consult obtained, and subsequent cardiac event monitor revealed atrial fibrillation.  He was put on Eliquis 5 mg twice daily, then advised to stop it when he developed rectal bleeding.  Last cardiology office visit on 12/16/2022. Keith Leblanc was seen by Dr. Tressia Danas at this office for rectal bleeding leading to a colonoscopy in November 2019.  This revealed 2 diminutive polyps (TA and SSP), diverticulosis and internal hemorrhoids.  EGD at that time for reflux revealed grade a esophagitis, small hiatal hernia, mild nonspecific patchy gastric erythema with biopsies negative for H. pylori.  Duodenal polypoid mucosa-biopsies normal. He has had anemia dating back to at least 2021, at times microcytic. _______________   ***   ROS:  Review of Systems   Past Medical History: Past Medical History:  Diagnosis Date   Blood in stool    bright red blood    Chicken pox    Diabetes mellitus (HCC)    Erectile dysfunction    Gout    Hypertension      Past Surgical History: Past Surgical History:  Procedure Laterality Date   APPENDECTOMY  2004   COLONOSCOPY     TOOTH EXTRACTION  07/06/2020     Family History: Family History  Problem Relation Age of Onset   Alcohol abuse Father    Hypertension Father    Heart disease Father    Gout Father    Breast cancer Mother    Hypertension Mother    Gout Mother    Healthy Sister    Healthy Daughter    Colon cancer Neg Hx    Esophageal cancer Neg Hx    Rectal cancer Neg Hx    Stomach cancer Neg Hx     Social History: Social History    Socioeconomic History   Marital status: Single    Spouse name: Not on file   Number of children: Not on file   Years of education: Not on file   Highest education level: Not on file  Occupational History   Not on file  Tobacco Use   Smoking status: Former   Smokeless tobacco: Never  Vaping Use   Vaping Use: Never used  Substance and Sexual Activity   Alcohol use: Yes    Comment: daily    Drug use: Not Currently   Sexual activity: Yes  Other Topics Concern   Not on file  Social History Narrative   Works for [employer]    Not married    One daughter who does not live with him    Likes to gamble, walk in the parks, travel.    Social Determinants of Health   Financial Resource Strain: Not on file  Food Insecurity: No Food Insecurity (11/12/2022)   Hunger Vital Sign    Worried About Running Out of Food in the Last Year: Never true    Ran Out of Food in the Last Year: Never true  Transportation Needs: No Transportation Needs (11/12/2022)   PRAPARE - Administrator, Civil Service (Medical): No    Lack of Transportation (Non-Medical): No  Physical Activity: Not on file  Stress: Not on  file  Social Connections: Not on file    Allergies: No Known Allergies  Outpatient Meds: Current Outpatient Medications  Medication Sig Dispense Refill   allopurinol (ZYLOPRIM) 300 MG tablet TAKE 1 TABLET BY MOUTH DAILY 90 tablet 3   amLODipine (NORVASC) 10 MG tablet TAKE 1 TABLET BY MOUTH DAILY 90 tablet 3   bisoprolol (ZEBETA) 10 MG tablet TAKE 2 TABLETS BY MOUTH DAILY 180 tablet 3   blood glucose meter kit and supplies Dispense based on patient and insurance preference. Use up to four times daily as directed. (FOR ICD-9 250.00, 250.01). 1 each 0   buPROPion (WELLBUTRIN XL) 300 MG 24 hr tablet TAKE 1 TABLET BY MOUTH ONCE  DAILY (Patient taking differently: Take 300 mg by mouth daily.) 30 tablet 11   colchicine 0.6 MG tablet TAKE 1 TABLET BY MOUTH  DAILY. WHEN HAVING FLARES  TAKE 1  TABLET BY MOUTH  TWICE DAILY (Patient taking differently: Take 0.6 mg by mouth daily.) 180 tablet 0   Dulaglutide (TRULICITY) 0.75 MG/0.5ML SOPN Inject 0.75 mg in am weekly under skin 2 mL 5   fenofibrate (TRICOR) 145 MG tablet Take 1 tablet (145 mg total) by mouth daily. 90 tablet 1   Insulin Pen Needle (B-D UF III MINI PEN NEEDLES) 31G X 5 MM MISC USE 4 TIMES DAILY AFTER MEALS AND AT BEDTIME 100 each 11   Lancets (ONETOUCH ULTRASOFT) lancets Test blood sugars as directed. Dx E11.9 100 each 12   LANTUS SOLOSTAR 100 UNIT/ML Solostar Pen INJECT SUBCUTANEOUSLY 10 TO 12  UNITS DAILY (Patient taking differently: Inject 30 Units into the skin daily.) 15 mL 3   Multiple Vitamin (MULTIVITAMIN WITH MINERALS) TABS tablet Take 1 tablet by mouth daily. 30 tablet 0   omeprazole (PRILOSEC) 40 MG capsule TAKE 1 CAPSULE BY MOUTH DAILY 90 capsule 3   ONE TOUCH ULTRA TEST test strip USE UP TO 4 TIMES DAILY AS DIRECTED 100 each 3   rosuvastatin (CRESTOR) 20 MG tablet Take 1 tablet (20 mg total) by mouth daily. 90 tablet 3   sildenafil (REVATIO) 20 MG tablet TAKE 2-5 TABLETS BY MOUTH AS NEEDED FOR SEXUAL ACTIVITY (Patient taking differently: Take 20 mg by mouth daily as needed (For ED).) 50 tablet 2   thiamine (VITAMIN B-1) 100 MG tablet Take 1 tablet (100 mg total) by mouth daily. 30 tablet 0   No current facility-administered medications for this visit.      ___________________________________________________________________ Objective   Exam:  There were no vitals taken for this visit. Wt Readings from Last 3 Encounters:  12/16/22 254 lb 9.6 oz (115.5 kg)  11/13/22 256 lb (116.1 kg)  11/09/22 246 lb 4.1 oz (111.7 kg)    General: ***  Eyes: sclera anicteric, no redness ENT: oral mucosa moist without lesions, no cervical or supraclavicular lymphadenopathy CV: ***, no JVD, no peripheral edema Resp: clear to auscultation bilaterally, normal RR and effort noted GI: soft, *** tenderness, with active  bowel sounds. No guarding or palpable organomegaly noted. Skin; warm and dry, no rash or jaundice noted Neuro: awake, alert and oriented x 3. Normal gross motor function and fluent speech  Labs:     Latest Ref Rng & Units 12/17/2022    8:42 AM 11/13/2022   11:00 AM 11/10/2022    3:21 AM  CBC  WBC 4.0 - 10.5 K/uL 3.0  5.3  4.8   Hemoglobin 13.0 - 17.0 g/dL 86.5  8.6  C 8.4   Hematocrit 39.0 - 52.0 %  32.5  28.1  27.7   Platelets 150.0 - 400.0 K/uL 317.0  335.0  298     C Corrected result   Iron/TIBC/Ferritin/ %Sat    Component Value Date/Time   IRON 127 12/17/2022 0842   TIBC 439.6 12/17/2022 0842   FERRITIN 10.8 (L) 12/17/2022 0842   IRONPCTSAT 28.9 12/17/2022 0842      Latest Ref Rng & Units 12/17/2022    8:42 AM 11/13/2022   11:00 AM 11/10/2022    3:21 AM  CMP  Glucose 70 - 99 mg/dL 79  86  80   BUN 6 - 23 mg/dL 19  19  11    Creatinine 0.40 - 1.50 mg/dL 0.98  1.19  1.47   Sodium 135 - 145 mEq/L 141  141  137   Potassium 3.5 - 5.1 mEq/L 3.5  3.9  3.3   Chloride 96 - 112 mEq/L 104  105  105   CO2 19 - 32 mEq/L 27  27  22    Calcium 8.4 - 10.5 mg/dL 9.2  8.9  8.5    GFR fluctuates high 40s to low 50s  B12 decreased at 125 in March 2023    level decreased at 187 in May of this year and 164 in June of this year Radiologic Studies:  ***  Assessment: No diagnosis found.  ***  Plan:  ***  Thank you for the courtesy of this consult.  Please call me with any questions or concerns.  Charlie Pitter III  CC: Referring provider noted above

## 2023-01-02 ENCOUNTER — Ambulatory Visit: Payer: Self-pay

## 2023-01-02 NOTE — Patient Outreach (Signed)
  Care Coordination   01/02/2023 Name: Tayron Hunnell MRN: [id number] DOB: [birth year]   Care Coordination Outreach Attempts:  An unsuccessful telephone outreach was attempted today to offer the patient information about available care coordination services.  Follow Up Plan:  Additional outreach attempts will be made to offer the patient care coordination information and services.   Encounter Outcome:  No Answer   Care Coordination Interventions:  No, not indicated    Bary Leriche, RN, MSN [facility] Care Management Care Management Coordinator Direct Line [phone]

## 2023-01-08 ENCOUNTER — Telehealth: Payer: Self-pay

## 2023-01-08 NOTE — Patient Outreach (Signed)
  Care Coordination   01/08/2023 Name: Chong Wojdyla MRN: [id number] DOB: 1966/03/28   Care Coordination Outreach Attempts:  A second unsuccessful outreach was attempted today to offer the patient with information about available care coordination services.  Follow Up Plan:  Additional outreach attempts will be made to offer the patient care coordination information and services.   Encounter Outcome:  No Answer   Care Coordination Interventions:  No, not indicated    Bary Leriche, RN, MSN [facility] Care Management Care Management Coordinator Direct Line [phone]

## 2023-01-14 ENCOUNTER — Encounter: Payer: Self-pay | Admitting: Licensed Clinical Social Worker

## 2023-01-14 ENCOUNTER — Telehealth: Payer: Self-pay

## 2023-01-14 NOTE — Patient Outreach (Signed)
  Care Coordination   01/14/2023 Name: Keith Leblanc MRN: [id number] DOB: June 14, 1966   Care Coordination Outreach Attempts:  A third unsuccessful outreach was attempted today to offer the patient with information about available care coordination services.  Follow Up Plan:  No further outreach attempts will be made at this time. We have been unable to contact the patient to offer or enroll patient in care coordination services  Encounter Outcome:  No Answer   Care Coordination Interventions:  No, not indicated    Keith Leriche, RN, MSN [facility] Care Management Care Management Coordinator Direct Line [phone]

## 2023-01-16 ENCOUNTER — Telehealth: Payer: Self-pay | Admitting: Licensed Clinical Social Worker

## 2023-01-16 NOTE — Patient Outreach (Signed)
  Care Coordination   01/14/2023 Name: Keith Leblanc MRN: [id number] DOB: May 27, 1966   Care Coordination Outreach Attempts:  An unsuccessful telephone outreach was attempted for a scheduled appointment today.  Follow Up Plan:  Additional outreach attempts will be made to offer the patient care coordination information and services.   Encounter Outcome:  No Answer   Care Coordination Interventions:  No, not indicated    Jenel Lucks, MSW, LCSW [facility] Care Management [place]  [facility] [email or web address] Phone [phone] 8:49 AM

## 2023-02-18 ENCOUNTER — Other Ambulatory Visit: Payer: Self-pay

## 2023-02-18 NOTE — Patient Outreach (Signed)
First telephone outreach attempt to obtain mRS. No answer. Left message for returned call.  Philmore Pali [facility] Management Assistant [phone]

## 2023-02-20 ENCOUNTER — Other Ambulatory Visit: Payer: Self-pay

## 2023-02-20 NOTE — Patient Outreach (Signed)
Second telephone outreach attempt to obtain mRS. No answer. Left message for returned call.  Shacoya Summers [facility] Management Assistant [phone]  

## 2023-02-21 ENCOUNTER — Other Ambulatory Visit: Payer: Self-pay

## 2023-02-21 NOTE — Patient Outreach (Signed)
Telephone outreach to patient to obtain mRS was successfully completed. MRS= 0  Keith Leblanc [facility] Care Management Assistant [phone]  

## 2023-03-06 ENCOUNTER — Other Ambulatory Visit: Payer: Self-pay | Admitting: Adult Health

## 2023-03-06 DIAGNOSIS — M1A49X0 Other secondary chronic gout, multiple sites, without tophus (tophi): Secondary | ICD-10-CM

## 2023-03-18 ENCOUNTER — Ambulatory Visit: Payer: BC Managed Care – PPO | Admitting: Internal Medicine

## 2023-03-26 ENCOUNTER — Ambulatory Visit: Payer: BC Managed Care – PPO | Admitting: Internal Medicine

## 2023-03-31 NOTE — Progress Notes (Unsigned)
Naz Denunzio [phone]) on 04/01/2023 8:11:54 AM   CV Studies:  Cardiac Studies & Procedures       ECHOCARDIOGRAM  ECHOCARDIOGRAM COMPLETE 11/09/2022  Narrative ECHOCARDIOGRAM REPORT    Patient Name:   Keith Leblanc Date of Exam: 11/09/2022 Medical Rec #:  [id number]       Height:       78.0 in Accession #:     [id number]      Weight:       246.3 lb Date of Birth:  1965/08/26       BSA:          2.466 m Patient Age:    56 years        BP:           133/71 mmHg Patient Gender: M               HR:           63 bpm. Exam Location:  Inpatient  Procedure: 2D Echo, 3D Echo, Cardiac Doppler, Color Doppler and Strain Analysis  Indications:    Stroke  History:        Patient has prior history of Echocardiogram examinations, most recent 02/08/2004. Stroke; Risk Factors:Hypertension, Current Smoker and Diabetes.  Sonographer:    Sheralyn Boatman RDCS Referring Phys: [id number] ERIC LINDZEN  IMPRESSIONS   1. Left ventricular ejection fraction, by estimation, is 60 to 65%. Left ventricular ejection fraction by 3D volume is 61 %. The left ventricle has normal function. The left ventricle has no regional wall motion abnormalities. There is moderate left ventricular hypertrophy. Left ventricular diastolic parameters are indeterminate. The average left ventricular global longitudinal strain is -20.7 %. The global longitudinal strain is normal. 2. Right ventricular systolic function is normal. The right ventricular size is normal. Tricuspid regurgitation signal is inadequate for assessing PA pressure. 3. The mitral valve is normal in structure. No evidence of mitral valve regurgitation. 4. The aortic valve is tricuspid. Aortic valve regurgitation is not visualized. No aortic stenosis is present. 5. Aortic dilatation noted. There is dilatation of the ascending aorta, measuring 40 mm. 6. The inferior vena cava is normal in size with greater than 50% respiratory variability, suggesting right atrial pressure of 3 mmHg.  FINDINGS Left Ventricle: Left ventricular ejection fraction, by estimation, is 60 to 65%. Left ventricular ejection fraction by 3D volume is 61 %. The left ventricle has normal function. The left ventricle has no regional wall motion abnormalities. The average left ventricular global longitudinal strain is  -20.7 %. The global longitudinal strain is normal. The left ventricular internal cavity size was normal in size. There is moderate left ventricular hypertrophy. Left ventricular diastolic parameters are indeterminate.  Right Ventricle: The right ventricular size is normal. No increase in right ventricular wall thickness. Right ventricular systolic function is normal. Tricuspid regurgitation signal is inadequate for assessing PA pressure.  Left Atrium: Left atrial size was normal in size.  Right Atrium: Right atrial size was normal in size.  Pericardium: There is no evidence of pericardial effusion.  Mitral Valve: The mitral valve is normal in structure. No evidence of mitral valve regurgitation.  Tricuspid Valve: The tricuspid valve is normal in structure. Tricuspid valve regurgitation is trivial.  Aortic Valve: The aortic valve is tricuspid. Aortic valve regurgitation is not visualized. No aortic stenosis is present.  Pulmonic Valve: The pulmonic valve was not well visualized. Pulmonic valve regurgitation is not visualized.  Aorta: The aortic root is normal in size and structure and aortic  Naz Denunzio [phone]) on 04/01/2023 8:11:54 AM   CV Studies:  Cardiac Studies & Procedures       ECHOCARDIOGRAM  ECHOCARDIOGRAM COMPLETE 11/09/2022  Narrative ECHOCARDIOGRAM REPORT    Patient Name:   Keith Leblanc Date of Exam: 11/09/2022 Medical Rec #:  [id number]       Height:       78.0 in Accession #:     [id number]      Weight:       246.3 lb Date of Birth:  1965/08/26       BSA:          2.466 m Patient Age:    56 years        BP:           133/71 mmHg Patient Gender: M               HR:           63 bpm. Exam Location:  Inpatient  Procedure: 2D Echo, 3D Echo, Cardiac Doppler, Color Doppler and Strain Analysis  Indications:    Stroke  History:        Patient has prior history of Echocardiogram examinations, most recent 02/08/2004. Stroke; Risk Factors:Hypertension, Current Smoker and Diabetes.  Sonographer:    Sheralyn Boatman RDCS Referring Phys: [id number] ERIC LINDZEN  IMPRESSIONS   1. Left ventricular ejection fraction, by estimation, is 60 to 65%. Left ventricular ejection fraction by 3D volume is 61 %. The left ventricle has normal function. The left ventricle has no regional wall motion abnormalities. There is moderate left ventricular hypertrophy. Left ventricular diastolic parameters are indeterminate. The average left ventricular global longitudinal strain is -20.7 %. The global longitudinal strain is normal. 2. Right ventricular systolic function is normal. The right ventricular size is normal. Tricuspid regurgitation signal is inadequate for assessing PA pressure. 3. The mitral valve is normal in structure. No evidence of mitral valve regurgitation. 4. The aortic valve is tricuspid. Aortic valve regurgitation is not visualized. No aortic stenosis is present. 5. Aortic dilatation noted. There is dilatation of the ascending aorta, measuring 40 mm. 6. The inferior vena cava is normal in size with greater than 50% respiratory variability, suggesting right atrial pressure of 3 mmHg.  FINDINGS Left Ventricle: Left ventricular ejection fraction, by estimation, is 60 to 65%. Left ventricular ejection fraction by 3D volume is 61 %. The left ventricle has normal function. The left ventricle has no regional wall motion abnormalities. The average left ventricular global longitudinal strain is  -20.7 %. The global longitudinal strain is normal. The left ventricular internal cavity size was normal in size. There is moderate left ventricular hypertrophy. Left ventricular diastolic parameters are indeterminate.  Right Ventricle: The right ventricular size is normal. No increase in right ventricular wall thickness. Right ventricular systolic function is normal. Tricuspid regurgitation signal is inadequate for assessing PA pressure.  Left Atrium: Left atrial size was normal in size.  Right Atrium: Right atrial size was normal in size.  Pericardium: There is no evidence of pericardial effusion.  Mitral Valve: The mitral valve is normal in structure. No evidence of mitral valve regurgitation.  Tricuspid Valve: The tricuspid valve is normal in structure. Tricuspid valve regurgitation is trivial.  Aortic Valve: The aortic valve is tricuspid. Aortic valve regurgitation is not visualized. No aortic stenosis is present.  Pulmonic Valve: The pulmonic valve was not well visualized. Pulmonic valve regurgitation is not visualized.  Aorta: The aortic root is normal in size and structure and aortic  Naz Denunzio [phone]) on 04/01/2023 8:11:54 AM   CV Studies:  Cardiac Studies & Procedures       ECHOCARDIOGRAM  ECHOCARDIOGRAM COMPLETE 11/09/2022  Narrative ECHOCARDIOGRAM REPORT    Patient Name:   Keith Leblanc Date of Exam: 11/09/2022 Medical Rec #:  [id number]       Height:       78.0 in Accession #:     [id number]      Weight:       246.3 lb Date of Birth:  1965/08/26       BSA:          2.466 m Patient Age:    56 years        BP:           133/71 mmHg Patient Gender: M               HR:           63 bpm. Exam Location:  Inpatient  Procedure: 2D Echo, 3D Echo, Cardiac Doppler, Color Doppler and Strain Analysis  Indications:    Stroke  History:        Patient has prior history of Echocardiogram examinations, most recent 02/08/2004. Stroke; Risk Factors:Hypertension, Current Smoker and Diabetes.  Sonographer:    Sheralyn Boatman RDCS Referring Phys: [id number] ERIC LINDZEN  IMPRESSIONS   1. Left ventricular ejection fraction, by estimation, is 60 to 65%. Left ventricular ejection fraction by 3D volume is 61 %. The left ventricle has normal function. The left ventricle has no regional wall motion abnormalities. There is moderate left ventricular hypertrophy. Left ventricular diastolic parameters are indeterminate. The average left ventricular global longitudinal strain is -20.7 %. The global longitudinal strain is normal. 2. Right ventricular systolic function is normal. The right ventricular size is normal. Tricuspid regurgitation signal is inadequate for assessing PA pressure. 3. The mitral valve is normal in structure. No evidence of mitral valve regurgitation. 4. The aortic valve is tricuspid. Aortic valve regurgitation is not visualized. No aortic stenosis is present. 5. Aortic dilatation noted. There is dilatation of the ascending aorta, measuring 40 mm. 6. The inferior vena cava is normal in size with greater than 50% respiratory variability, suggesting right atrial pressure of 3 mmHg.  FINDINGS Left Ventricle: Left ventricular ejection fraction, by estimation, is 60 to 65%. Left ventricular ejection fraction by 3D volume is 61 %. The left ventricle has normal function. The left ventricle has no regional wall motion abnormalities. The average left ventricular global longitudinal strain is  -20.7 %. The global longitudinal strain is normal. The left ventricular internal cavity size was normal in size. There is moderate left ventricular hypertrophy. Left ventricular diastolic parameters are indeterminate.  Right Ventricle: The right ventricular size is normal. No increase in right ventricular wall thickness. Right ventricular systolic function is normal. Tricuspid regurgitation signal is inadequate for assessing PA pressure.  Left Atrium: Left atrial size was normal in size.  Right Atrium: Right atrial size was normal in size.  Pericardium: There is no evidence of pericardial effusion.  Mitral Valve: The mitral valve is normal in structure. No evidence of mitral valve regurgitation.  Tricuspid Valve: The tricuspid valve is normal in structure. Tricuspid valve regurgitation is trivial.  Aortic Valve: The aortic valve is tricuspid. Aortic valve regurgitation is not visualized. No aortic stenosis is present.  Pulmonic Valve: The pulmonic valve was not well visualized. Pulmonic valve regurgitation is not visualized.  Aorta: The aortic root is normal in size and structure and aortic  Naz Denunzio [phone]) on 04/01/2023 8:11:54 AM   CV Studies:  Cardiac Studies & Procedures       ECHOCARDIOGRAM  ECHOCARDIOGRAM COMPLETE 11/09/2022  Narrative ECHOCARDIOGRAM REPORT    Patient Name:   Keith Leblanc Date of Exam: 11/09/2022 Medical Rec #:  [id number]       Height:       78.0 in Accession #:     [id number]      Weight:       246.3 lb Date of Birth:  1965/08/26       BSA:          2.466 m Patient Age:    56 years        BP:           133/71 mmHg Patient Gender: M               HR:           63 bpm. Exam Location:  Inpatient  Procedure: 2D Echo, 3D Echo, Cardiac Doppler, Color Doppler and Strain Analysis  Indications:    Stroke  History:        Patient has prior history of Echocardiogram examinations, most recent 02/08/2004. Stroke; Risk Factors:Hypertension, Current Smoker and Diabetes.  Sonographer:    Sheralyn Boatman RDCS Referring Phys: [id number] ERIC LINDZEN  IMPRESSIONS   1. Left ventricular ejection fraction, by estimation, is 60 to 65%. Left ventricular ejection fraction by 3D volume is 61 %. The left ventricle has normal function. The left ventricle has no regional wall motion abnormalities. There is moderate left ventricular hypertrophy. Left ventricular diastolic parameters are indeterminate. The average left ventricular global longitudinal strain is -20.7 %. The global longitudinal strain is normal. 2. Right ventricular systolic function is normal. The right ventricular size is normal. Tricuspid regurgitation signal is inadequate for assessing PA pressure. 3. The mitral valve is normal in structure. No evidence of mitral valve regurgitation. 4. The aortic valve is tricuspid. Aortic valve regurgitation is not visualized. No aortic stenosis is present. 5. Aortic dilatation noted. There is dilatation of the ascending aorta, measuring 40 mm. 6. The inferior vena cava is normal in size with greater than 50% respiratory variability, suggesting right atrial pressure of 3 mmHg.  FINDINGS Left Ventricle: Left ventricular ejection fraction, by estimation, is 60 to 65%. Left ventricular ejection fraction by 3D volume is 61 %. The left ventricle has normal function. The left ventricle has no regional wall motion abnormalities. The average left ventricular global longitudinal strain is  -20.7 %. The global longitudinal strain is normal. The left ventricular internal cavity size was normal in size. There is moderate left ventricular hypertrophy. Left ventricular diastolic parameters are indeterminate.  Right Ventricle: The right ventricular size is normal. No increase in right ventricular wall thickness. Right ventricular systolic function is normal. Tricuspid regurgitation signal is inadequate for assessing PA pressure.  Left Atrium: Left atrial size was normal in size.  Right Atrium: Right atrial size was normal in size.  Pericardium: There is no evidence of pericardial effusion.  Mitral Valve: The mitral valve is normal in structure. No evidence of mitral valve regurgitation.  Tricuspid Valve: The tricuspid valve is normal in structure. Tricuspid valve regurgitation is trivial.  Aortic Valve: The aortic valve is tricuspid. Aortic valve regurgitation is not visualized. No aortic stenosis is present.  Pulmonic Valve: The pulmonic valve was not well visualized. Pulmonic valve regurgitation is not visualized.  Aorta: The aortic root is normal in size and structure and aortic  Cardiology Office Note    Date:  04/01/2023  ID:  Keith Leblanc, DOB 1966-03-27, MRN [id number] PCP:  Shirline Frees, NP  Cardiologist:  Maisie Fus, MD  Electrophysiologist:  None   Chief Complaint: Follow up for atrial fibrillation   History of Present Illness: .    Keith Leblanc is a 57 y.o. male with visit-pertinent history of CVA  in 10/2022, PAF, hypertension, type 2 diabetes mellitus, chronic anemia from B12 deficiency and iron deficiency and gout.   On 11/08/2022 he was admitted in setting of acute stroke after presenting with sudden onset of slurred speech and left-sided weakness.  CT showed no acute intracranial abnormality, brain MRI showed a subtle 2.3 cm focus of restricted diffusion involving the posterior right corona radiata/external capsule, consistent with a small acute right MCA territory infarct as well as a small focus of susceptibility artifact at the level of the right sylvian fissure suspected to reflect intraluminal thrombus with a distal right MCA branch.  Inpatient telemetry showed sinus with PACs, first-degree AV block.  Vascular ultrasound showed no evidence of DVT. Echo during admission indicated an LVEF of 60 to 65%, LV with normal function, no RWMA, moderate left ventricular hypertrophy, LV diastolic parameters were indeterminate.  Aortic dilation was noted measuring 40 mm.  He was discharged with a 30-day event monitor. This showed an average heart rate of 81 bpm, predominant rhythm was sinus rhythm, he did have episodes of atrial fibrillation, atrial flutter and 2nd degree AV block type 1. Following he was started on Eliquis 5 mg twice daily.   After initiation of Eliquis patient started noting bright red blood in his toilet. He was last seen by Dr. Wyline Mood on 12/16/22. He had remained stable from a cardiac perspective. He was instructed to hold Eliquis and to follow up with GI. On 01/01/23 he was seen by Dr. Myrtie Neither with [facility], patient was noted to have  bleeding internal hemorrhoids. It was recommended that he start using preparation H suppository then resume Eliquis. If bleeding recurred to increase suppository to twice daily. If bleeding persists he would need hemorrhoidal banding.   Today he presents for follow-up.  He reports that he is doing well.  He did restart his Eliquis although he was under the impression he was only taking 5 mg once daily, discussed that he should take 5 mg once in the morning and once at night, he is agreeable to restarting as directed.  He does note occasional bright red blood in the toilet, he has not been using suppositories as directed by GI, he will restart and follow-up with GI as needed.  He recently went on a trip to [place] and walked over 10,000 steps without anginal symptoms.   Labwork independently reviewed: 12/17/2022: Sodium 141, potassium 3.5, creatinine 1.47, magnesium 1.4, hemoglobin 10.2, hematocrit 32.5  ROS: .   Today he denies chest pain, shortness of breath, lower extremity edema, fatigue, palpitations, melena, hematuria, hemoptysis, diaphoresis, weakness, presyncope, syncope, orthopnea, and PND.  All other systems are reviewed and otherwise negative. Studies Reviewed: Marland Kitchen    EKG:  EKG is ordered today, personally reviewed, demonstrating  EKG Interpretation Date/Time:  Tuesday April 01 2023 08:04:14 EDT Ventricular Rate:  90 PR Interval:  264 QRS Duration:  78 QT Interval:  392 QTC Calculation: 479 R Axis:   -33  Text Interpretation: Sinus rhythm with 1st degree A-V block Left axis deviation Nonspecific T wave abnormality No significant change since last tracing Confirmed by Chad,

## 2023-04-01 ENCOUNTER — Encounter: Payer: Self-pay | Admitting: Physician Assistant

## 2023-04-01 ENCOUNTER — Ambulatory Visit: Payer: BC Managed Care – PPO | Attending: Internal Medicine | Admitting: Cardiology

## 2023-04-01 VITALS — BP 132/82 | HR 90 | Ht 77.0 in | Wt 250.0 lb

## 2023-04-01 DIAGNOSIS — D649 Anemia, unspecified: Secondary | ICD-10-CM

## 2023-04-01 DIAGNOSIS — Z79899 Other long term (current) drug therapy: Secondary | ICD-10-CM

## 2023-04-01 DIAGNOSIS — I480 Paroxysmal atrial fibrillation: Secondary | ICD-10-CM

## 2023-04-01 DIAGNOSIS — I10 Essential (primary) hypertension: Secondary | ICD-10-CM

## 2023-04-01 DIAGNOSIS — I639 Cerebral infarction, unspecified: Secondary | ICD-10-CM

## 2023-04-01 DIAGNOSIS — K625 Hemorrhage of anus and rectum: Secondary | ICD-10-CM | POA: Diagnosis not present

## 2023-04-01 MED ORDER — ELIQUIS 5 MG PO TABS: 5.0000 mg | ORAL_TABLET | Freq: Two times a day (BID) | ORAL | 5 refills | Status: DC

## 2023-04-01 NOTE — Patient Instructions (Signed)
Medication Instructions:  NO CHANGES *If you need a refill on your cardiac medications before your next appointment, please call your pharmacy*   Lab Work: CBC,BMET, AND MAGNESIUM TODAY. If you have labs (blood work) drawn today and your tests are completely normal, you will receive your results only by: MyChart Message (if you have MyChart) OR A paper copy in the mail If you have any lab test that is abnormal or we need to change your treatment, we will call you to review the results.   Testing/Procedures: NO TESTING   Follow-Up: At [facility], you and your health needs are our priority.  As part of our continuing mission to provide you with exceptional heart care, we have created designated Provider Care Teams.  These Care Teams include your primary Cardiologist (physician) and Advanced Practice Providers (APPs -  Physician Assistants and Nurse Practitioners) who all work together to provide you with the care you need, when you need it.     Your next appointment:   6 month(s)  Provider:   Carolan Clines, MD

## 2023-04-02 LAB — BASIC METABOLIC PANEL
BUN/Creatinine Ratio: 12 (ref 9–20)
BUN: 19 mg/dL (ref 6–24)
CO2: 27 mmol/L (ref 20–29)
Calcium: 9.4 mg/dL (ref 8.7–10.2)
Chloride: 102 mmol/L (ref 96–106)
Creatinine, Ser: 1.6 mg/dL — ABNORMAL HIGH (ref 0.76–1.27)
Glucose: 73 mg/dL (ref 70–99)
Potassium: 3.8 mmol/L (ref 3.5–5.2)
Sodium: 146 mmol/L — ABNORMAL HIGH (ref 134–144)
eGFR: 51 mL/min/{1.73_m2} — ABNORMAL LOW (ref >59)

## 2023-04-02 LAB — CBC
Hematocrit: 40.4 % (ref 37.5–51.0)
Hemoglobin: 13 g/dL (ref 13.0–17.7)
MCH: 32.8 pg (ref 26.6–33.0)
MCHC: 32.2 g/dL (ref 31.5–35.7)
MCV: 102 fL — ABNORMAL HIGH (ref 79–97)
Platelets: 363 10*3/uL (ref 150–450)
RBC: 3.96 x10E6/uL — ABNORMAL LOW (ref 4.14–5.80)
RDW: 13.5 % (ref 11.6–15.4)
WBC: 6.6 10*3/uL (ref 3.4–10.8)

## 2023-04-02 LAB — MAGNESIUM: Magnesium: 1.4 mg/dL — ABNORMAL LOW (ref 1.6–2.3)

## 2023-04-03 ENCOUNTER — Other Ambulatory Visit: Payer: Self-pay | Admitting: *Deleted

## 2023-04-03 DIAGNOSIS — Z79899 Other long term (current) drug therapy: Secondary | ICD-10-CM

## 2023-04-03 MED ORDER — MAGNESIUM OXIDE 400 MG PO TABS: 400.0000 mg | ORAL_TABLET | Freq: Every day | ORAL | 5 refills | Status: DC

## 2023-04-10 ENCOUNTER — Other Ambulatory Visit: Payer: Self-pay | Admitting: Adult Health

## 2023-04-10 DIAGNOSIS — I10 Essential (primary) hypertension: Secondary | ICD-10-CM

## 2023-04-11 NOTE — Telephone Encounter (Signed)
Pt needs to schedule CPE for further refills.

## 2023-05-16 ENCOUNTER — Other Ambulatory Visit: Payer: Self-pay | Admitting: *Deleted

## 2023-05-16 ENCOUNTER — Other Ambulatory Visit: Payer: Self-pay

## 2023-05-16 DIAGNOSIS — I480 Paroxysmal atrial fibrillation: Secondary | ICD-10-CM

## 2023-05-16 DIAGNOSIS — I10 Essential (primary) hypertension: Secondary | ICD-10-CM

## 2023-05-16 MED ORDER — MAGNESIUM OXIDE 400 MG PO TABS: 400.0000 mg | ORAL_TABLET | Freq: Every day | ORAL | 10 refills | Status: DC

## 2023-05-16 MED ORDER — APIXABAN 5 MG PO TABS: 5.0000 mg | ORAL_TABLET | Freq: Two times a day (BID) | ORAL | 1 refills | Status: DC

## 2023-05-16 NOTE — Telephone Encounter (Signed)
Eliquis 5mg  refill request received. Patient is 57 years old, weight-113.4kg, Crea-1.60 on 04/01/23, Diagnosis-Afib, and last seen by Reather Littler, NP on 04/01/23. Dose is appropriate based on dosing criteria. Will send in refill to requested pharmacy.

## 2023-05-23 ENCOUNTER — Other Ambulatory Visit: Payer: Self-pay | Admitting: Adult Health

## 2023-05-23 ENCOUNTER — Other Ambulatory Visit: Payer: Self-pay | Admitting: Internal Medicine

## 2023-05-23 DIAGNOSIS — E1165 Type 2 diabetes mellitus with hyperglycemia: Secondary | ICD-10-CM

## 2023-05-23 DIAGNOSIS — I10 Essential (primary) hypertension: Secondary | ICD-10-CM

## 2023-07-10 ENCOUNTER — Other Ambulatory Visit: Payer: Self-pay | Admitting: Adult Health

## 2023-07-10 NOTE — Telephone Encounter (Signed)
Copied from CRM [id number]. Topic: Clinical - Medication Refill >> Jul 10, 2023 12:48 PM Ernst Spell wrote: Most Recent Primary Care Visit:  Provider: [facility]  Department: [facility]  Visit Type: LAB  Date: 12/17/2022  Medication: colchicine tablet 0.6 mg  Has the patient contacted their pharmacy? No Pt stated that the medication is discontinued.  Is this the correct pharmacy for this prescription? Yes   [id number] - [place], [place] - [address] AT [facility] [address] [place] [address] Phone: [phone] Fax: [phone]   Has the prescription been filled recently? No  Is the patient out of the medication? Yes  Has the patient been seen for an appointment in the last year OR does the patient have an upcoming appointment? No  Can we respond through MyChart? Yes  Agent: Please be advised that Rx refills may take up to 3 business days. We ask that you follow-up with your pharmacy.

## 2023-07-11 MED ORDER — COLCHICINE 0.6 MG PO TABS: ORAL_TABLET | ORAL | 0 refills | Status: DC

## 2023-07-16 ENCOUNTER — Other Ambulatory Visit: Payer: Self-pay | Admitting: Adult Health

## 2023-07-16 DIAGNOSIS — F339 Major depressive disorder, recurrent, unspecified: Secondary | ICD-10-CM

## 2023-08-05 ENCOUNTER — Other Ambulatory Visit: Payer: Self-pay | Admitting: Internal Medicine

## 2023-08-05 DIAGNOSIS — Z794 Long term (current) use of insulin: Secondary | ICD-10-CM

## 2023-08-08 ENCOUNTER — Telehealth: Payer: Self-pay

## 2023-08-08 NOTE — Telephone Encounter (Signed)
Pt pharmacy reached out requesting a refill and this patient has not been seen x 1 year.   Please get pt scheduled.,

## 2023-08-08 NOTE — Telephone Encounter (Signed)
LMx1 for patient to call office to schedule follow up with Dr. Elvera Lennox.

## 2023-08-14 NOTE — Telephone Encounter (Signed)
 LMx1 for patient to call office to schedule follow up with Dr. Elvera Lennox.

## 2023-08-21 ENCOUNTER — Ambulatory Visit: Payer: BC Managed Care – PPO | Admitting: Internal Medicine

## 2023-08-28 ENCOUNTER — Telehealth: Admitting: Internal Medicine

## 2023-08-29 ENCOUNTER — Telehealth: Payer: Self-pay

## 2023-08-29 ENCOUNTER — Ambulatory Visit: Attending: Internal Medicine | Admitting: Internal Medicine

## 2023-08-29 VITALS — BP 154/81 | HR 74 | Ht 77.0 in | Wt 241.0 lb

## 2023-08-29 DIAGNOSIS — I480 Paroxysmal atrial fibrillation: Secondary | ICD-10-CM

## 2023-08-29 NOTE — Patient Instructions (Signed)
 Medication Instructions:  Continue current medications *If you need a refill on your cardiac medications before your next appointment, please call your pharmacy*   Follow-Up: At [facility], you and your health needs are our priority.  As part of our continuing mission to provide you with exceptional heart care, we have created designated Provider Care Teams.  These Care Teams include your primary Cardiologist (physician) and Advanced Practice Providers (APPs -  Physician Assistants and Nurse Practitioners) who all work together to provide you with the care you need, when you need it.   Your next appointment:   6 month(s)  Provider:   Theodore Demark, PA-C, Edd Fabian, FNP, Micah Flesher, PA-C, Marjie Skiff, PA-C, Robet Leu, PA-C, Juanda Crumble, PA-C, Joni Reining, DNP, ANP, Azalee Course, PA-C, Bernadene Person, NP, or Reather Littler, NP  Other Instructions

## 2023-08-29 NOTE — Progress Notes (Signed)
 Cardiology Office Note:    Date:  08/29/2023   ID:  Keith Leblanc, DOB [birth year], MRN [id number]  PCP:  Shirline Frees, NP   [facility] Providers Cardiologist:  Maisie Fus, MD     Referring MD: Shirline Frees, NP   No chief complaint on file. CVA  History of Present Illness:    Keith Leblanc is a 58 y.o. male with a hx of hypertension, type 2 diabetes mellitus, and gout referral for hospital FU. He was seen in the hospital post CVA. Small right MCA. Tele was not clearly afib at the time. He had a cardiac monitor  and I was alerted that his monitor showed afib. He was then started on eliquis. After starting, he noted BRB in his toilet. He was instructed to stop his eliquis. The bleeding ceased. He also noted leg swelling that resolved.  He comes in today, he denies any palpitations. No signs of OSA. He has intermittent SOB. No signs of CHF.   Interim hx 08/29/2023 Virtual Visit He had flu symptoms recently. He was SOB. He also developed swelling in his ankles. Today however, his swelling has improved. He notes occasional bleeding from the lower GI. ?hemorrhoids. He was recommended a suppository and consideration of banding. He has had a prior colonoscopies, he had some polyps.He is still taking eliquis. He denies chest pain    Current Medications: Current Outpatient Medications on File Prior to Visit  Medication Sig Dispense Refill   allopurinol (ZYLOPRIM) 300 MG tablet TAKE 1 TABLET BY MOUTH DAILY 90 tablet 3   amLODipine (NORVASC) 10 MG tablet TAKE 1 TABLET BY MOUTH DAILY 30 tablet 11   apixaban (ELIQUIS) 5 MG TABS tablet Take 1 tablet (5 mg total) by mouth 2 (two) times daily. 180 tablet 1   bisoprolol (ZEBETA) 10 MG tablet TAKE 2 TABLETS BY MOUTH DAILY 180 tablet 3   blood glucose meter kit and supplies Dispense based on patient and insurance preference. Use up to four times daily as directed. (FOR ICD-9 250.00, 250.01). 1 each 0   buPROPion (WELLBUTRIN XL) 300  MG 24 hr tablet TAKE 1 TABLET BY MOUTH ONCE  DAILY 30 tablet 3   colchicine 0.6 MG tablet TAKE 1 TABLET BY MOUTH  DAILY WHEN HAVING FLARES  TAKE 1 TABLET BY MOUTH  TWICE DAILY 180 tablet 0   Dulaglutide (TRULICITY) 0.75 MG/0.5ML SOPN Inject 0.75 mg in am weekly under skin 2 mL 5   fenofibrate (TRICOR) 145 MG tablet Take 1 tablet (145 mg total) by mouth daily. 90 tablet 1   Insulin Pen Needle (B-D UF III MINI PEN NEEDLES) 31G X 5 MM MISC USE 4 TIMES DAILY AFTER MEALS AND AT BEDTIME 100 each 11   Lancets (ONETOUCH ULTRASOFT) lancets Test blood sugars as directed. Dx E11.9 100 each 12   LANTUS SOLOSTAR 100 UNIT/ML Solostar Pen INJECT SUBCUTANEOUSLY 10 TO 12  UNITS DAILY (Patient taking differently: Inject 30 Units into the skin daily.) 15 mL 3   magnesium oxide (MAG-OX) 400 MG tablet Take 1 tablet (400 mg total) by mouth daily. 30 tablet 10   Multiple Vitamin (MULTIVITAMIN WITH MINERALS) TABS tablet Take 1 tablet by mouth daily. 30 tablet 0   Multiple Vitamin (MULTIVITAMIN) tablet Take 1 tablet by mouth daily.     omeprazole (PRILOSEC) 40 MG capsule TAKE 1 CAPSULE BY MOUTH DAILY 90 capsule 3   ONE TOUCH ULTRA TEST test strip USE UP TO 4 TIMES DAILY AS DIRECTED 100 each 3  sildenafil (REVATIO) 20 MG tablet TAKE 2-5 TABLETS BY MOUTH AS NEEDED FOR SEXUAL ACTIVITY (Patient taking differently: Take 20 mg by mouth daily as needed (For ED).) 50 tablet 2   thiamine (VITAMIN B-1) 100 MG tablet Take 1 tablet (100 mg total) by mouth daily. 30 tablet 0   No current facility-administered medications on file prior to visit.     Allergies:   Patient has no known allergies.   Family History: The patient's family history includes Alcohol abuse in his father; Breast cancer in his mother; Gout in his father and mother; Healthy in his daughter and sister; Heart disease in his father; Hypertension in his father and mother. There is no history of Colon cancer, Esophageal cancer, Rectal cancer, or Stomach  cancer.  ROS:   Please see the history of present illness.     All other systems reviewed and are negative.  EKGs/Labs/Other Studies Reviewed:    The following studies were reviewed today:  Recent Labs: 11/08/2022: ALT 18 11/09/2022: TSH 1.813 04/01/2023: BUN 19; Creatinine, Ser 1.60; Hemoglobin 13.0; Magnesium 1.4; Platelets 363; Potassium 3.8; Sodium 146   Recent Lipid Panel    Component Value Date/Time   CHOL 199 11/09/2022 0045   TRIG 135 11/09/2022 0045   HDL 76 11/09/2022 0045   CHOLHDL 2.6 11/09/2022 0045   VLDL 27 11/09/2022 0045   LDLCALC 96 11/09/2022 0045   LDLDIRECT 72.0 09/12/2021 1007     Risk Assessment/Calculations:    CHA2DS2-VASc Score = 4   This indicates a 4.8% annual risk of stroke. The patient's score is based upon: CHF History: 0 HTN History: 1 Diabetes History: 1 Stroke History: 2 Vascular Disease History: 0 Age Score: 0 Gender Score: 0       Physical Exam:    VS:   Vitals:   08/29/23 0930  BP: (!) 154/81  Pulse: 74     Wt Readings from Last 3 Encounters:  04/01/23 250 lb (113.4 kg)  01/01/23 250 lb (113.4 kg)  12/16/22 254 lb 9.6 oz (115.5 kg)    Well appearing MMM Normal WOB No abdominal distension Normal Affect   ASSESSMENT:   Ankle Swelling resolved  Paroxysmal Atrial Fibrillation: Was seen in the hospital for a small CVA. He had sinus with PACs mostly. He wore a cardiac monitor on dispo and we were called by [employer] stating he had an episode of afib. He was recommended to start eliquis. He had BRBPR and stopped eliquis. He saw GI who recommended suppositories for possible hemorrhoids. He is still on [facility] - continue  eliquis 5 mg BID - regular rhythm today - continue bisoprolol 20 mg daily  Post CVA: cont eliquis per above. Cont crestor.   HTN: continue norvasc 10 mg daily  PLAN:    In order of problems listed above:   Follow up 6 months with an APP  Virtual Visit via Video Note  I connected with  Irena Reichmann on 08/29/23 at  9:40 AM EST by a video enabled telemedicine application and verified that I am speaking with the correct person using two identifiers.  I discussed the limitations of evaluation and management by telemedicine and the availability of in person appointments. The patient expressed understanding and agreed to proceed.  I discussed the assessment and treatment plan with the patient. The patient was provided an opportunity to ask questions and all were answered. The patient agreed with the plan and demonstrated an understanding of the instructions.   The patient was advised  to call back or seek an in-person evaluation if the symptoms worsen or if the condition fails to improve as anticipated.  I provided 15 minutes of non-face-to-face time during this encounter.   Maisie Fus, MD      Medication Adjustments/Labs and Tests Ordered: Current medicines are reviewed at length with the patient today.  Concerns regarding medicines are outlined above.    Signed, Maisie Fus, MD  08/29/2023 8:59 AM    [facility]

## 2023-08-29 NOTE — Telephone Encounter (Signed)
  Patient Consent for Virtual Visit        Keith Leblanc has provided verbal consent on 08/29/2023 for a virtual visit (video or telephone).   CONSENT FOR VIRTUAL VISIT FOR:  Keith Leblanc  By participating in this virtual visit I agree to the following:  I hereby voluntarily request, consent and authorize [facility] and its employed or contracted physicians, physician assistants, nurse practitioners or other licensed health care professionals (the Practitioner), to provide me with telemedicine health care services (the "Services") as deemed necessary by the treating Practitioner. I acknowledge and consent to receive the Services by the Practitioner via telemedicine. I understand that the telemedicine visit will involve communicating with the Practitioner through live audiovisual communication technology and the disclosure of certain medical information by electronic transmission. I acknowledge that I have been given the opportunity to request an in-person assessment or other available alternative prior to the telemedicine visit and am voluntarily participating in the telemedicine visit.  I understand that I have the right to withhold or withdraw my consent to the use of telemedicine in the course of my care at any time, without affecting my right to future care or treatment, and that the Practitioner or I may terminate the telemedicine visit at any time. I understand that I have the right to inspect all information obtained and/or recorded in the course of the telemedicine visit and may receive copies of available information for a reasonable fee.  I understand that some of the potential risks of receiving the Services via telemedicine include:  Delay or interruption in medical evaluation due to technological equipment failure or disruption; Information transmitted may not be sufficient (e.g. poor resolution of images) to allow for appropriate medical decision making by the  Practitioner; and/or  In rare instances, security protocols could fail, causing a breach of personal health information.  Furthermore, I acknowledge that it is my responsibility to provide information about my medical history, conditions and care that is complete and accurate to the best of my ability. I acknowledge that Practitioner's advice, recommendations, and/or decision may be based on factors not within their control, such as incomplete or inaccurate data provided by me or distortions of diagnostic images or specimens that may result from electronic transmissions. I understand that the practice of medicine is not an exact science and that Practitioner makes no warranties or guarantees regarding treatment outcomes. I acknowledge that a copy of this consent can be made available to me via my patient portal [facility] MyChart), or I can request a printed copy by calling the office of [facility].    I understand that my insurance will be billed for this visit.   I have read or had this consent read to me. I understand the contents of this consent, which adequately explains the benefits and risks of the Services being provided via telemedicine.  I have been provided ample opportunity to ask questions regarding this consent and the Services and have had my questions answered to my satisfaction. I give my informed consent for the services to be provided through the use of telemedicine in my medical care

## 2023-09-04 ENCOUNTER — Other Ambulatory Visit: Payer: Self-pay | Admitting: Adult Health

## 2023-09-05 ENCOUNTER — Other Ambulatory Visit: Payer: Self-pay | Admitting: Adult Health

## 2023-09-05 DIAGNOSIS — F339 Major depressive disorder, recurrent, unspecified: Secondary | ICD-10-CM

## 2023-09-16 ENCOUNTER — Other Ambulatory Visit: Payer: Self-pay | Admitting: Internal Medicine

## 2023-09-16 DIAGNOSIS — E1165 Type 2 diabetes mellitus with hyperglycemia: Secondary | ICD-10-CM

## 2023-09-19 ENCOUNTER — Other Ambulatory Visit (HOSPITAL_COMMUNITY): Payer: Self-pay

## 2023-09-19 ENCOUNTER — Telehealth: Payer: Self-pay | Admitting: Pharmacy Technician

## 2023-09-19 ENCOUNTER — Encounter: Payer: Self-pay | Admitting: Pharmacy Technician

## 2023-09-19 NOTE — Telephone Encounter (Signed)
error 

## 2023-09-19 NOTE — Telephone Encounter (Signed)
 Prior Authorization form/request asks a question that requires your assistance. Please see the question below and advise accordingly. The PA will not be submitted until the necessary information is received.   **Received PA for Trulicity. The last documented office visit was from 2023 with your office. A good chance the PA will be denied. Patient needs a recent office visit and labs for approval.**

## 2023-10-02 ENCOUNTER — Other Ambulatory Visit: Payer: Self-pay

## 2023-10-02 MED ORDER — ONETOUCH ULTRA TEST VI STRP: ORAL_STRIP | 5 refills | Status: DC

## 2023-10-02 NOTE — Telephone Encounter (Signed)
 Requested Prescriptions   Signed Prescriptions Disp Refills   glucose blood (ONETOUCH ULTRA TEST) test strip 200 each 5    Sig: Use to check blood sugar 1-2 times a day.    Authorizing Provider: Carlus Pavlov    Ordering User: Pollie Meyer

## 2023-10-03 ENCOUNTER — Telehealth: Payer: Self-pay | Admitting: Internal Medicine

## 2023-10-03 NOTE — Telephone Encounter (Signed)
 Patient identification verified by 2 forms. Keith Rail, RN    Called and spoke to patient  Patient states:   -his ankles and top of foot sometimes become numb and swell   -he works from home and sits still all day, unsure if this could be the cause   -sits at a desk for work   -symptoms on going for 3-4 weeks   -discussed concerns with Dr. Wyline Mood at 3/7 video visit   -lately he has been elevating feet more   -swelling decreases overtime when feet is elevated   -he is wearing compression socks, noticed decreased swelling since wearing the compression socks   -prior to swelling starting he develops numbness in legs, once that happens he starts to wear compression socks  Patient denies:   -SOB/difficulty breathing   -weight gain  Advised patient  -Start wearing compression socks at beginning of work day   -try to walk periodically throughout work day   -elevate feet more during work day  Informed patient message sent to Dr. Wyline Mood for recommendations  Patient verbalized understanding, no questions at this time

## 2023-10-03 NOTE — Telephone Encounter (Signed)
 Attempted to call patient, no answer left message requesting a call back.

## 2023-10-03 NOTE — Telephone Encounter (Signed)
  Per MyChart scheduling message:   Pt c/o swelling/edema: STAT if pt has developed SOB within 24 hours   If swelling, where is the swelling located? In my foot/ankle area   How much weight have you gained and in what time span? no weight gain or loss    Have you gained 2 pounds in a day or 5 pounds in a week? no   Do you have a log of your daily weights (if so, list)? no   Are you currently taking a fluid pill? I'm currently taking a blood thinner (Eliquis)   Are you currently shortness of breath? no   Have you traveled recently in a car or plane for an extended period of time? no

## 2023-10-03 NOTE — Telephone Encounter (Signed)
 Patient is returning call.

## 2023-10-10 NOTE — Telephone Encounter (Signed)
 Spoke with pt, he reports elevating his feet while sitting and using his compression socks are working for his swelling.

## 2023-10-23 ENCOUNTER — Encounter: Payer: Self-pay | Admitting: Internal Medicine

## 2023-10-23 ENCOUNTER — Ambulatory Visit (INDEPENDENT_AMBULATORY_CARE_PROVIDER_SITE_OTHER): Payer: BC Managed Care – PPO | Admitting: Internal Medicine

## 2023-10-23 VITALS — BP 124/70 | HR 80 | Ht 77.0 in | Wt 260.6 lb

## 2023-10-23 DIAGNOSIS — E1165 Type 2 diabetes mellitus with hyperglycemia: Secondary | ICD-10-CM

## 2023-10-23 DIAGNOSIS — E66811 Obesity, class 1: Secondary | ICD-10-CM

## 2023-10-23 DIAGNOSIS — Z794 Long term (current) use of insulin: Secondary | ICD-10-CM

## 2023-10-23 DIAGNOSIS — E781 Pure hyperglyceridemia: Secondary | ICD-10-CM

## 2023-10-23 DIAGNOSIS — E661 Drug-induced obesity: Secondary | ICD-10-CM

## 2023-10-23 DIAGNOSIS — Z6832 Body mass index (BMI) 32.0-32.9, adult: Secondary | ICD-10-CM

## 2023-10-23 LAB — POCT GLYCOSYLATED HEMOGLOBIN (HGB A1C): Hemoglobin A1C: 4.7 % (ref 4.0–5.6)

## 2023-10-23 MED ORDER — TRULICITY 0.75 MG/0.5ML ~~LOC~~ SOAJ: SUBCUTANEOUS | 3 refills | Status: DC

## 2023-10-23 MED ORDER — LANTUS SOLOSTAR 100 UNIT/ML ~~LOC~~ SOPN: 24.0000 [IU] | PEN_INJECTOR | Freq: Every day | SUBCUTANEOUS | 3 refills | Status: DC

## 2023-10-23 NOTE — Patient Instructions (Addendum)
 Please restart: - Trulicity  0.75 mg weekly  Reduce: - Lantus  24 units in a.m.  Please stop at the lab.  Please return in 6 months with your sugar log.

## 2023-10-23 NOTE — Progress Notes (Signed)
 Patient ID: Keith Leblanc, male   DOB: 1966/04/06, 58 y.o.   MRN: [id number]   HPI: Keith Leblanc is a 58 y.o.-year-old male, returning for f/u for  DM2, dx in 06/2016 (prev. Prediabetes since 2015-16), insulin -dependent, uncontrolled, with complications (CKD). Last visit 1 year and 8 months ago.  Interim history: He was not able to stop drinking since last visit.  He did see counselors without good results.  He did not try AA.  Also, he was given a medication by PCP to help with this, but he did not try it.  However, his daughter is marrying next month and starting today he is trying to stay off alcohol so that he can walk her down the aisle. No chest pain, increased urination, nausea Since last visit, he had a stroke in 10/2022.  He was found to have PAF.  Now on Eliquis .  Reviewed history: Patient was diagnosed with diabetes in 07/07/2016, when he presented to the hospital in DKA. An A1c was 12%. His CBG was 1081. Retrospectively, he had increased urination, increased thirst, blurred vision, fatigue, rapid weight loss of 25 lbs. He had a lot of stress at work + drinking A LOT of sweet drinks (4 cans of soda a day + gatorade + juice). He then came off off these but still working on decreasing Alcohol - 4-5 beers a day.  He was started on insulin  inhouse, and his CBG is greatly improved afterwards. Less than a month later, his HbA1c decreased to 9.6%.  Reviewed HbA1c levels: Lab Results  Component Value Date   HGBA1C 5.6 11/09/2022   HGBA1C 4.9 03/14/2022   HGBA1C 4.8 07/10/2021  03/14/2022: HbA1c calculated from fructosamine is 5.3%.  Pt was on a regimen of: - Metformin  500 mg 0-1x a day with dinner >> could not tolerate this 2/2 nausea - Basaglar  40 units at bedtime - takes 2-3x a week - Novolog  6 units 3x a day, before meals >> stopped 12/2016  Now on: - Trulicity  0.75 >> 1.5 >> 0.75 mg weekly - out for 3 weeks - Lantus  30 >> 24 >> 20 >> 10-12 >> but actually taking 30 units daily  in am   Pt checks his sugars once a day: - am: 89, 130s, 140 >> 85-110 >> 69, 90-105 >> 78, 95-138, 160 - 2h after b'fast: 107-122 >> n/c >> 176 >> n/c - before lunch: 130-140 >> 78, 120-134 >> n/c - 2h after lunch: n/c >> 165 >> n/c >> 87-139 - before dinner: n/c >> 95-110 >> n/c >> 173 >> n/c - 2h after dinner: 120s >> n/c >> 30 min-1h: 115-130 >> 100-120, 189 - bedtime: n/c >> 89-100 >> n/c >> 165 >> n/c - nighttime: n/c Lowest sugar was 89 >> 78 >> 69; he has hypoglycemia awareness at 80s Highest sugar was 400s ... >> 176 >> 165 >> 2 mo ago: 189. He was in the ED 05/2018 for hyperglycemia (CBG 400s >> ED: 361).   Glucometer: One Touch Ultra  Pt's meals are: - Breakfast: 2-3 boiled eggs, sausage, cheese, diet tea - Lunch: salad, chicken or pork, coke zero - Dinner: sea food + veggies + brown rice, coke zero - Snacks: water  -+ CKD: Lab Results  Component Value Date   BUN 19 04/01/2023   BUN 19 12/17/2022   CREATININE 1.60 (H) 04/01/2023   CREATININE 1.47 12/17/2022   Lab Results  Component Value Date   MICRALBCREAT 2.8 12/17/2022  11/22/2021: 22/2.28, GFR 33, ACR 11, glucose  95  -+ HL: Lab Results  Component Value Date   CHOL 199 11/09/2022   HDL 76 11/09/2022   LDLCALC 96 11/09/2022   LDLDIRECT 72.0 09/12/2021   TRIG 135 11/09/2022   CHOLHDL 2.6 11/09/2022  Prev. On Fenofibrate  145 mg daily >> now off, he thinks. He remembers starting a statin >> not anymore.  - last eye exam was in 06/2020: No DR reportedly.   -No numbness and tingling in his feet.  Previously on White Ginseng, Ashwagandha, burdock root. He has a low B12 of 165 in 11/2022.  He is on a multivitamin.  ROS: + see HPI  I reviewed pt's medications, allergies, PMH, social hx, family hx, and changes were documented in the history of present illness. Otherwise, unchanged from my initial visit note.  Past Medical History:  Diagnosis Date   Blood in stool    bright red blood    Chicken pox     Diabetes mellitus (HCC)    Erectile dysfunction    Gout    Hypertension    Stroke [facility])    Past Surgical History:  Procedure Laterality Date   APPENDECTOMY  2004   COLONOSCOPY     TOOTH EXTRACTION  07/06/2020   Social History   Social History   Marital status: Single    Spouse name: N/A   Number of children: 1 - 81 y/o in 2018   Occupational History   Aeronautical engineer    Social History Main Topics   Smoking status: Former Smoker   Smokeless tobacco: Started to chew tobacco    Alcohol use      Comment: 4-5 beers a day    Drug use: Yes    Types: Marijuana   Sexual activity: Yes   Social History Narrative   Works for [employer]    Not married    One daughter who does not live with him    Likes to gamble, walk in the parks, travel.    Current Outpatient Medications on File Prior to Visit  Medication Sig Dispense Refill   allopurinol  (ZYLOPRIM ) 300 MG tablet TAKE 1 TABLET BY MOUTH DAILY 90 tablet 3   amLODipine  (NORVASC ) 10 MG tablet TAKE 1 TABLET BY MOUTH DAILY 30 tablet 11   apixaban  (ELIQUIS ) 5 MG TABS tablet Take 1 tablet (5 mg total) by mouth 2 (two) times daily. 180 tablet 1   bisoprolol  (ZEBETA ) 10 MG tablet TAKE 2 TABLETS BY MOUTH DAILY 180 tablet 3   blood glucose meter kit and supplies Dispense based on patient and insurance preference. Use up to four times daily as directed. (FOR ICD-9 250.00, 250.01). 1 each 0   buPROPion  (WELLBUTRIN  XL) 300 MG 24 hr tablet TAKE 1 TABLET BY MOUTH ONCE  DAILY 30 tablet 0   colchicine  0.6 MG tablet TAKE 1 TABLET BY MOUTH DAILY  WHEN HAVING FLARES TAKE 1 TABLET BY MOUTH TWICE DAILY 180 tablet 3   Dulaglutide  (TRULICITY ) 0.75 MG/0.5ML SOAJ INJECT THE CONTENTS OF ONE PEN  SUBCUTANEOUSLY WEEKLY AS  DIRECTED 6 mL 3   fenofibrate  (TRICOR ) 145 MG tablet Take 1 tablet (145 mg total) by mouth daily. 90 tablet 1   glucose blood (ONETOUCH ULTRA TEST) test strip Use to check blood sugar 1-2 times a day. 200 each 5   insulin  glargine  (LANTUS  SOLOSTAR) 100 UNIT/ML Solostar Pen Inject 30 Units into the skin daily. 15 mL 2   Insulin  Pen Needle (B-D UF III MINI PEN NEEDLES) 31G X 5 MM  MISC USE 4 TIMES DAILY AFTER MEALS AND AT BEDTIME 100 each 11   Lancets (ONETOUCH ULTRASOFT) lancets Test blood sugars as directed. Dx E11.9 100 each 12   magnesium  oxide (MAG-OX) 400 MG tablet Take 1 tablet (400 mg total) by mouth daily. 30 tablet 10   Multiple Vitamin (MULTIVITAMIN WITH MINERALS) TABS tablet Take 1 tablet by mouth daily. 30 tablet 0   Multiple Vitamin (MULTIVITAMIN) tablet Take 1 tablet by mouth daily.     omeprazole  (PRILOSEC) 40 MG capsule TAKE 1 CAPSULE BY MOUTH DAILY 90 capsule 3   sildenafil  (REVATIO ) 20 MG tablet TAKE 2-5 TABLETS BY MOUTH AS NEEDED FOR SEXUAL ACTIVITY (Patient taking differently: Take 20 mg by mouth daily as needed (For ED).) 50 tablet 2   thiamine  (VITAMIN B-1) 100 MG tablet Take 1 tablet (100 mg total) by mouth daily. 30 tablet 0   No current facility-administered medications on file prior to visit.   No Known Allergies Family History  Problem Relation Age of Onset   Alcohol abuse Father    Hypertension Father    Heart disease Father    Gout Father    Breast cancer Mother    Hypertension Mother    Gout Mother    Healthy Sister    Healthy Daughter    Colon cancer Neg Hx    Esophageal cancer Neg Hx    Rectal cancer Neg Hx    Stomach cancer Neg Hx   Pt has FH of DM in mother.  PE: BP 124/70   Pulse 80   Ht 6\' 5"  (1.956 m)   Wt 260 lb 9.6 oz (118.2 kg)   SpO2 93%   BMI 30.90 kg/m  Wt Readings from Last 10 Encounters:  10/23/23 260 lb 9.6 oz (118.2 kg)  08/29/23 241 lb (109.3 kg)  04/01/23 250 lb (113.4 kg)  01/01/23 250 lb (113.4 kg)  12/16/22 254 lb 9.6 oz (115.5 kg)  11/13/22 256 lb (116.1 kg)  11/09/22 246 lb 4.1 oz (111.7 kg)  03/14/22 230 lb 12.8 oz (104.7 kg)  09/12/21 243 lb (110.2 kg)  07/10/21 240 lb 9.6 oz (109.1 kg)   Constitutional: overweight, in NAD Eyes:  EOMI, no  exophthalmos ENT: no neck masses, no cervical lymphadenopathy Cardiovascular: RRR, No MRG, + B significant pitting edema Respiratory: CTA B Musculoskeletal: no deformities Skin:no rashes Neurological: + tremor with outstretched hands Diabetic Foot Exam - Simple   Simple Foot Form Diabetic Foot exam was performed with the following findings: Yes 10/23/2023  9:52 AM  Visual Inspection See comments: Yes Sensation Testing See comments: Yes Pulse Check See comments: Yes Comments No sensation to monofilament B Significant pitting edema B Pedal pulses nonpalpable B B thick halluceal toenails      ASSESSMENT: 1. DM2, -insulin -dependent, now more controlled, with complications - CKD  He has type 2, rather than type 1 diabetes: Component     Latest Ref Rng & Units 08/27/2016  Glucose     70 - 99 mg/dL 409 (H)  Pancreatic Islet Cell Antibody     <5 JDF Units <5  Glutamic Acid Decarb Ab     <5 IU/mL <5  C-Peptide     0.80 - 3.85 ng/mL 1.45   2. Obesity class 1 3.  Hypertriglyceridemia  PLAN:  1. Patient with history of fairly well-controlled diabetes, on basal insulin  and weekly GLP-1 receptor agonist, returning after a long absence of 1 year and 8 months.  When I last saw him, HbA1c was  at goal, at 5.6% but he also has a history of a low hemoglobin which could influence the results.  We checked a fructosamine and the calculated HbA1c was 5.3%, concordant with a directly measured HbA1c.  Blood sugars were at goal but the HbA1c was still lower than expected from his log.  We discussed about reducing the Lantus  further.  We continued his Trulicity . -Since last visit, he had a stroke 10/2022.  He has a history of paroxysmal atrial fibrillation.  He is on Eliquis . At that time, HbA1c was still at goal:5.6%. - At today's visit, he is off Trulicity  after running out and not having refills.  I will refill this for him today.  Also, he is taking a higher dose of Lantus  than recommended so we  will go ahead and decrease the dose today. -We again discussed about the importance of quitting alcohol.  I recommended to try AA. -I advised him to: Patient Instructions  Please restart: - Trulicity  0.75 mg weekly  Reduce: - Lantus  24 units in a.m.  Please stop at the lab.  Please return in 6 months with your sugar log.   - we checked his HbA1c: 4.7% (lower than expected from his blood sugars-explained why this is likely not accurate for him).  Will check a fructosamine level. - advised to check sugars at different times of the day - 1x a day, rotating check times - advised for yearly eye exams >> he is not UTD - will check annual labs today - return to clinic in 6 months  2. Obesity class 1 - Will continue his GLP-1 receptor agonist which should also help with weight loss - He lost 58 pounds before the last 3 visits combined - He gained 30 pounds since last visit, part of it could be fluid retention  3. HTG - We discussed at the previous visit about reducing/stopping alcohol - Latest lipid panel from a year ago showed LDL above target, the rest of the fractions at goal: Lab Results  Component Value Date   CHOL 199 11/09/2022   HDL 76 11/09/2022   LDLCALC 96 11/09/2022   LDLDIRECT 72.0 09/12/2021   TRIG 135 11/09/2022   CHOLHDL 2.6 11/09/2022  - He was on fenofibrate  145 mg daily -but he does not think he is taking it now.  He is not on a statin, although he seems to remember that he was recommended to start this after his stroke. - he is due for another lipid panel.  He will probably need to restart fenofibrate  and also to start a statin afterwards.  Our goal LDL for him is less than 55.  Orders Placed This Encounter  Procedures   Lipid Panel w/reflex Direct LDL   Microalbumin / creatinine urine ratio   Fructosamine   Comprehensive metabolic panel with GFR   POCT glycosylated hemoglobin (Hb A1C)   No urine sample.  Component     Latest Ref Rng 10/23/2023  Sodium      135 - 146 mmol/L 141   Potassium     3.5 - 5.3 mmol/L 5.1   Chloride     98 - 110 mmol/L 105   CO2     20 - 32 mmol/L 24   Glucose     65 - 139 mg/dL 161   BUN     7 - 25 mg/dL 17   Creatinine     0.96 - 1.30 mg/dL 0.45 (H)   Total Bilirubin     0.2 -  1.2 mg/dL 0.5   AST     10 - 35 U/L 55 (H)   ALT     9 - 46 U/L 27   Total Protein     6.1 - 8.1 g/dL 7.2   Calcium      8.6 - 10.3 mg/dL 9.2   Cholesterol     <161 mg/dL 096   Triglycerides     <150 mg/dL 045   HDL Cholesterol     > OR = 40 mg/dL 66   Total CHOL/HDL Ratio     <5.0 (calc) 2.5   Hemoglobin A1C     4.0 - 5.6 % 4.7   BUN/Creatinine Ratio     6 - 22 (calc) 11   Albumin MSPROF     3.6 - 5.1 g/dL 3.8   Globulin     1.9 - 3.7 g/dL (calc) 3.4   AG Ratio     1.0 - 2.5 (calc) 1.1   Alkaline phosphatase (APISO)     35 - 144 U/L 68   Fructosamine     205 - 285 umol/L 227   eGFR     > OR = 60 mL/min/1.36m2 52 (L)   LDL Cholesterol (Calc)     mg/dL (calc) 76   Non-HDL Cholesterol (Calc)     <130 mg/dL (calc) 98   [id number] calculated from fructosamine is 5.4%. eGFR is slightly low, AST slightly high, LDL is slightly higher than our goal of less than 70. The rest of the labs are at goal.  Emilie Harden, MD PhD [facility]

## 2023-10-25 LAB — COMPREHENSIVE METABOLIC PANEL WITH GFR
AG Ratio: 1.1 (calc) (ref 1.0–2.5)
ALT: 27 U/L (ref 9–46)
AST: 55 U/L — ABNORMAL HIGH (ref 10–35)
Albumin: 3.8 g/dL (ref 3.6–5.1)
Alkaline phosphatase (APISO): 68 U/L (ref 35–144)
BUN/Creatinine Ratio: 11 (calc) (ref 6–22)
BUN: 17 mg/dL (ref 7–25)
CO2: 24 mmol/L (ref 20–32)
Calcium: 9.2 mg/dL (ref 8.6–10.3)
Chloride: 105 mmol/L (ref 98–110)
Creat: 1.56 mg/dL — ABNORMAL HIGH (ref 0.70–1.30)
Globulin: 3.4 g/dL (ref 1.9–3.7)
Glucose, Bld: 120 mg/dL (ref 65–139)
Potassium: 5.1 mmol/L (ref 3.5–5.3)
Sodium: 141 mmol/L (ref 135–146)
Total Bilirubin: 0.5 mg/dL (ref 0.2–1.2)
Total Protein: 7.2 g/dL (ref 6.1–8.1)
eGFR: 52 mL/min/{1.73_m2} — ABNORMAL LOW (ref >=60)

## 2023-10-25 LAB — LIPID PANEL W/REFLEX DIRECT LDL
Cholesterol: 164 mg/dL (ref <200)
HDL: 66 mg/dL (ref >=40)
LDL Cholesterol (Calc): 76 mg/dL
Non-HDL Cholesterol (Calc): 98 mg/dL (ref <130)
Total CHOL/HDL Ratio: 2.5 (calc) (ref <5.0)
Triglycerides: 134 mg/dL (ref <150)

## 2023-10-25 LAB — FRUCTOSAMINE: Fructosamine: 227 umol/L (ref 205–285)

## 2023-10-27 ENCOUNTER — Encounter: Payer: Self-pay | Admitting: Internal Medicine

## 2023-11-10 ENCOUNTER — Other Ambulatory Visit: Payer: Self-pay | Admitting: Adult Health

## 2023-12-04 ENCOUNTER — Encounter: Payer: Self-pay | Admitting: Internal Medicine

## 2023-12-04 NOTE — Telephone Encounter (Signed)
 Called and spoke with patient about MyChart message.  Patient states he has had swelling and numbness in his feet since April this year, this has continued daily. Patient states he wears compression socks and has recently started using diabetic nerve cream with temporary relief.  Patient states swelling goes away completely overnight when sleeping and gradually returns throughout the day as he sits in front of computer working from home.  Patient states he has occasional SOB with minimal activity (ex. Putting on socks). He states he is not very active and is likely out of shape. He reports he first noticed this about a month ago. Denies SOB at rest, denies CP, dizziness, change in appetite.  He does take amlodipine  (has been taking for a few years now).  Patient states he elevates his feet while sitting on a pillow-like footrest. He states when he does this he can feel the numbness starting in his heels.  Encouraged patient to continue to wear compression socks, elevating legs/feet as much as possible when sitting for extended periods of time and also to get and up walk around every 1-2 hours to help with circulation.  Patient would like to know if there is anything else he needs to do to help with swelling/occasional SOB or if he needs an office visit for further evaluation.   Will forward to Katlyn West, NP to review and advise.

## 2023-12-10 ENCOUNTER — Encounter (HOSPITAL_BASED_OUTPATIENT_CLINIC_OR_DEPARTMENT_OTHER): Payer: Self-pay

## 2023-12-10 NOTE — Progress Notes (Unsigned)
 Cardiology Office Note    Patient Name: Keith Leblanc Date of Encounter: 12/10/2023  Primary Care Provider:  Alto Atta, NP Primary Cardiologist:  Bridgette Campus, MD (Inactive) Primary Electrophysiologist: None   Past Medical History    Past Medical History:  Diagnosis Date   Blood in stool    bright red blood    Chicken pox    Diabetes mellitus (HCC)    Erectile dysfunction    Gout    Hypertension    Stroke Keith Leblanc)     History of Present Illness  Keith Leblanc is a 58 y.o. male with a PMH of paroxysmal AF, HTN, DM type II, CVA s/p right MCA 2024, ascending aortic dilation, who presents today for complaint of lower extremity swelling and numbness.  Keith Leblanc was seen initially in 11/09/2022 for evaluation of atrial fibrillation in the setting of acute right MCA CVA.  Went a TTE that showed EF of 60 to 65% with no RWMA and moderate LVH with aortic dilation noted.  He was discharged with 30-day event monitor that showed episodes of AF as well as atrial flutter with second-degree AVB and patient was started on Eliquis  5 mg twice daily.  He developed rectal bleeding and was evaluated by [facility] with recommendation to start prep H suppositories.  Eliquis  was resumed with no recurrent bleeding.  He was last seen by Dr. Amanda Jungling by virtual visit on 08/29/2023 and noted some swelling in his ankles with prior flulike symptoms.  He denied any chest pain and BP was elevated at 154/81.  He contacted our office on 12/04/2023 with complaint of numbness and lower extremity swelling.  He reported swelling resolving completely overnight but gradually returning during the day.  He also reported new complaint of shortness of breath.  Keith Leblanc presents today for complaint of shortness of breath and lower extremity swelling with numbness. He experiences occasional shortness of breath over the past three months, particularly after physical activities like taking out the trash. He describes it as  feeling like a 'stuffy nose' and notes it is not constant. No chest pain is reported. He experiences swelling that returns to baseline overnight but reoccurs throughout the day. He is on amlodipine , which he has been taking for years at 10 mg, and he notes that the swelling could be related to this medication. He recently started using compression socks, which help reduce swelling and numbness when worn. He experiences numbness and tightness in his calves, especially when sitting for long periods. A Doppler study last year ruled out DVT. He monitors his blood pressure at home, which is usually around 120 mmHg. He experiences 'white coat syndrome' during office visits. He works from home and acknowledges not getting up often enough during his workday, which lasts about eight hours. He has type 2 diabetes and manages his blood sugar with an endocrinologist. His blood sugar was 122 mg/dL this morning. He consumes caffeinated sodas, which could be a trigger for his AFib. He does not exercise regularly. He reports occasional tingling and numbness in his feet, especially when sitting for extended periods. He uses a foot cushion to elevate his feet while working. Patient denies chest pain, palpitations, PND, orthopnea, nausea, vomiting, dizziness, syncope, edema, weight gain, or early satiety.  Discussed the use of AI scribe software for clinical note transcription with the patient, who gave verbal consent to proceed.  History of Present Illness   Review of Systems  Please see the history of present illness.  All other systems reviewed and are otherwise negative except as noted above.  Physical Exam    Wt Readings from Last 3 Encounters:  10/23/23 260 lb 9.6 oz (118.2 kg)  08/29/23 241 lb (109.3 kg)  04/01/23 250 lb (113.4 kg)   ZO:[id number] were no vitals filed for this visit.,There is no height or weight on file to calculate BMI. GEN: Well nourished, well developed in no acute distress Neck: No JVD; No  carotid bruits Pulmonary: Clear to auscultation without rales, wheezing or rhonchi  Cardiovascular: Irregularly irregular regular rhythm. Normal S1. Normal S2.   Murmurs: There is no murmur.  ABDOMEN: Soft, non-tender, non-distended EXTREMITIES: Bilateral +2 pitting edema  EKG/LABS/ Recent Cardiac Studies   ECG personally reviewed by me today -atrial fibrillation with a rate of 60 bpm and TWI in leads V3, V5 and V6 consistent with prior EKG.  Risk Assessment/Calculations:    CHA2DS2-VASc Score = 4   This indicates a 4.8% annual risk of stroke. The patient's score is based upon: CHF History: 0 HTN History: 1 Diabetes History: 1 Stroke History: 2 Vascular Disease History: 0 Age Score: 0 Gender Score: 0         Lab Results  Component Value Date   WBC 6.6 04/01/2023   HGB 13.0 04/01/2023   HCT 40.4 04/01/2023   MCV 102 (H) 04/01/2023   PLT 363 04/01/2023   Lab Results  Component Value Date   CREATININE 1.56 (H) 10/23/2023   BUN 17 10/23/2023   NA 141 10/23/2023   K 5.1 10/23/2023   CL 105 10/23/2023   CO2 24 10/23/2023   Lab Results  Component Value Date   CHOL 164 10/23/2023   HDL 66 10/23/2023   LDLCALC 76 10/23/2023   LDLDIRECT 72.0 09/12/2021   TRIG 134 10/23/2023   CHOLHDL 2.5 10/23/2023    Lab Results  Component Value Date   HGBA1C 4.7 10/23/2023   Assessment & Plan   Assessment & Plan  1.  Lower extremity edema: -Peripheral edema likely due to amlodipine  and prolonged sitting. No DVT signs. - Reduce amlodipine  to 5 mg daily. -2+ pitting edema bilaterally -Patient will start Lasix 20 mg x 1 week and then as needed -Patient will take potassium 10 mEq x 1 week and then as needed with Lasix - Continue use of compression socks and elevation. - Educate on standing and moving regularly during work hours. - BMET in 1 week  2.  Shortness of breath: -Intermittent post-exertional shortness of breath. Differential includes atrial fibrillation and  deconditioning. - Order echocardiogram to assess heart structure. - Encourage increased physical activity to address potential deconditioning. - Patient will try Lasix x 7 days and then as needed  3.  History of PAF: -PAF confirmed with EKG, controlled rate at 60 bpm. No acute changes. On Eliquis  for stroke risk reduction. - - Continue Eliquis  5 mg twice daily and Zebeta  10 mg twice daily - Educate on reducing caffeine and alcohol intake. - Consider 7-day monitor if shortness of breath persists.  4.  History of CVA: - Patient reports no residual effects from prior stroke - Continue Tricor  145 mg daily and Eliquis  5 mg twice daily  5.  Essential hypertension: - Patient's blood pressure today was initially elevated at 132/88 and was 122/84 on recheck - We will reduce amlodipine  to 5 mg daily and patient will continue Zebeta  10 mg twice daily  Disposition: Follow-up with Dr. Vira Grieves or APP in 6 months    Signed, Valentina Gasman  Tomas Fountain, NP 12/10/2023, 11:55 AM [facility]

## 2023-12-11 ENCOUNTER — Ambulatory Visit: Attending: Nurse Practitioner | Admitting: Nurse Practitioner

## 2023-12-11 ENCOUNTER — Encounter: Payer: Self-pay | Admitting: Nurse Practitioner

## 2023-12-11 VITALS — BP 122/84 | HR 62 | Ht 77.0 in | Wt 266.0 lb

## 2023-12-11 DIAGNOSIS — I10 Essential (primary) hypertension: Secondary | ICD-10-CM | POA: Diagnosis not present

## 2023-12-11 DIAGNOSIS — R600 Localized edema: Secondary | ICD-10-CM | POA: Diagnosis not present

## 2023-12-11 DIAGNOSIS — R0602 Shortness of breath: Secondary | ICD-10-CM

## 2023-12-11 DIAGNOSIS — I639 Cerebral infarction, unspecified: Secondary | ICD-10-CM | POA: Diagnosis not present

## 2023-12-11 DIAGNOSIS — I480 Paroxysmal atrial fibrillation: Secondary | ICD-10-CM

## 2023-12-11 MED ORDER — POTASSIUM CHLORIDE ER 10 MEQ PO TBCR: 10.0000 meq | EXTENDED_RELEASE_TABLET | Freq: Every day | ORAL | 0 refills | Status: DC

## 2023-12-11 MED ORDER — AMLODIPINE BESYLATE 5 MG PO TABS: 5.0000 mg | ORAL_TABLET | Freq: Every day | ORAL | 1 refills | Status: DC

## 2023-12-11 MED ORDER — FUROSEMIDE 20 MG PO TABS: 20.0000 mg | ORAL_TABLET | Freq: Every day | ORAL | 0 refills | Status: DC

## 2023-12-11 NOTE — Patient Instructions (Addendum)
 Medication Instructions:  DECREASE Norvasc  to 5mg  Take 1 tablet once a day  START Lasix 20mg  Take 1 tablet once a day for 7 days then take as needed START Potassium 10meq Take 1 tablet once a day with Lasix for 7 days then take as needed with lasix  *If you need a refill on your cardiac medications before your next appointment, please call your pharmacy*  Lab Work: BMET IN 1 WEEK (12/18/2023) If you have labs (blood work) drawn today and your tests are completely normal, you will receive your results only by: MyChart Message (if you have MyChart) OR A paper copy in the mail If you have any lab test that is abnormal or we need to change your treatment, we will call you to review the results.  Testing/Procedures: Your physician has requested that you have an echocardiogram. Echocardiography is a painless test that uses sound waves to create images of your heart. It provides your doctor with information about the size and shape of your heart and how well your heart's chambers and valves are working. This procedure takes approximately one hour. There are no restrictions for this procedure. Please do NOT wear cologne, perfume, aftershave, or lotions (deodorant is allowed). Please arrive 15 minutes prior to your appointment time.  Please note: We ask at that you not bring children with you during ultrasound (echo/ vascular) testing. Due to room size and safety concerns, children are not allowed in the ultrasound rooms during exams. Our front office staff cannot provide observation of children in our lobby area while testing is being conducted. An adult accompanying a patient to their appointment will only be allowed in the ultrasound room at the discretion of the ultrasound technician under special circumstances. We apologize for any inconvenience.  Follow-Up: At [facility], you and your health needs are our priority.  As part of our continuing mission to provide you with exceptional heart  care, our providers are all part of one team.  This team includes your primary Cardiologist (physician) and Advanced Practice Providers or APPs (Physician Assistants and Nurse Practitioners) who all work together to provide you with the care you need, when you need it.  Your next appointment:   6 month(s)  Provider:   Christena Covert, MD    We recommend signing up for the patient portal called MyChart.  Sign up information is provided on this After Visit Summary.  MyChart is used to connect with patients for Virtual Visits (Telemedicine).  Patients are able to view lab/test results, encounter notes, upcoming appointments, etc.  Non-urgent messages can be sent to your provider as well.   To learn more about what you can do with MyChart, go to [email or web address].   Other Instructions

## 2023-12-18 ENCOUNTER — Other Ambulatory Visit: Payer: Self-pay | Admitting: *Deleted

## 2023-12-18 DIAGNOSIS — R600 Localized edema: Secondary | ICD-10-CM

## 2023-12-18 DIAGNOSIS — I10 Essential (primary) hypertension: Secondary | ICD-10-CM

## 2023-12-18 DIAGNOSIS — R0602 Shortness of breath: Secondary | ICD-10-CM

## 2023-12-18 DIAGNOSIS — I639 Cerebral infarction, unspecified: Secondary | ICD-10-CM | POA: Diagnosis not present

## 2023-12-18 DIAGNOSIS — I480 Paroxysmal atrial fibrillation: Secondary | ICD-10-CM

## 2023-12-18 LAB — BASIC METABOLIC PANEL WITH GFR
BUN/Creatinine Ratio: 12 (ref 9–20)
BUN: 18 mg/dL (ref 6–24)
CO2: 21 mmol/L (ref 20–29)
Calcium: 9.3 mg/dL (ref 8.7–10.2)
Chloride: 102 mmol/L (ref 96–106)
Creatinine, Ser: 1.46 mg/dL — ABNORMAL HIGH (ref 0.76–1.27)
Glucose: 107 mg/dL — ABNORMAL HIGH (ref 70–99)
Potassium: 3.9 mmol/L (ref 3.5–5.2)
Sodium: 142 mmol/L (ref 134–144)
eGFR: 56 mL/min/{1.73_m2} — ABNORMAL LOW (ref >59)

## 2023-12-19 ENCOUNTER — Ambulatory Visit: Payer: Self-pay | Admitting: Nurse Practitioner

## 2023-12-19 DIAGNOSIS — E859 Amyloidosis, unspecified: Secondary | ICD-10-CM

## 2023-12-25 NOTE — Progress Notes (Signed)
 [facility] Quality Team Note  Name: [name] Date of Birth: 10-Nov-1965 MRN: [id number] Date: 12/25/2023  [facility] Quality Team has reviewed this patient's chart, please see recommendations below:  [facility] Quality Other; (CHART REVIEWED. ABSTRACTED BLOOD PRESSURE AND MOST RECENT A1C. PATIENT NEEDS URINE MICROALBUMIN/CREATININE RATIO FOR GAP CLOSURE.)

## 2024-01-07 ENCOUNTER — Other Ambulatory Visit: Payer: Self-pay | Admitting: Nurse Practitioner

## 2024-01-27 ENCOUNTER — Ambulatory Visit (HOSPITAL_COMMUNITY)
Admission: RE | Admit: 2024-01-27 | Discharge: 2024-01-27 | Disposition: A | Discharge status: Home / Self Care | Source: Ambulatory Visit | Attending: Cardiology | Admitting: Cardiology

## 2024-01-27 DIAGNOSIS — I639 Cerebral infarction, unspecified: Secondary | ICD-10-CM | POA: Insufficient documentation

## 2024-01-27 DIAGNOSIS — R0602 Shortness of breath: Secondary | ICD-10-CM | POA: Diagnosis not present

## 2024-01-27 DIAGNOSIS — R600 Localized edema: Secondary | ICD-10-CM | POA: Insufficient documentation

## 2024-01-27 DIAGNOSIS — I10 Essential (primary) hypertension: Secondary | ICD-10-CM | POA: Diagnosis not present

## 2024-01-27 DIAGNOSIS — I480 Paroxysmal atrial fibrillation: Secondary | ICD-10-CM | POA: Insufficient documentation

## 2024-01-27 LAB — ECHOCARDIOGRAM COMPLETE
AR max vel: 2.38 cm2
AV Area VTI: 2.34 cm2
AV Area mean vel: 2.49 cm2
AV Mean grad: 3.7 mmHg
AV Peak grad: 6.5 mmHg
Ao pk vel: 1.28 m/s
Area-P 1/2: 2.82 cm2
S' Lateral: 3.1 cm

## 2024-01-30 ENCOUNTER — Telehealth: Payer: Self-pay

## 2024-01-30 NOTE — Telephone Encounter (Signed)
 Spoke to patient he stated someone called him this morning and scheduled a amyloid test.He would like to speak to PA about recent echo results to discuss reason for amyloid test.I will send message to [facility] PA.

## 2024-02-06 ENCOUNTER — Telehealth: Payer: Self-pay | Admitting: Nurse Practitioner

## 2024-02-06 NOTE — Telephone Encounter (Signed)
 Pt would like a c/b regarding echo results as well as discuss upcoming test. Please advise

## 2024-02-09 ENCOUNTER — Other Ambulatory Visit: Payer: Self-pay | Admitting: Cardiology

## 2024-02-10 ENCOUNTER — Other Ambulatory Visit: Payer: Self-pay | Admitting: Adult Health

## 2024-02-11 ENCOUNTER — Telehealth (HOSPITAL_COMMUNITY): Payer: Self-pay | Admitting: *Deleted

## 2024-02-11 ENCOUNTER — Other Ambulatory Visit: Payer: Self-pay | Admitting: Cardiology

## 2024-02-11 DIAGNOSIS — E859 Amyloidosis, unspecified: Secondary | ICD-10-CM

## 2024-02-11 NOTE — Telephone Encounter (Signed)
 Left instructions for Amyloid Study on vm.

## 2024-02-11 NOTE — Telephone Encounter (Signed)
 The original prescription was discontinued on 01/01/2023 by Krystal Andree GAILS, CMA for the following reason: Change in therapy. Renewing this prescription may not be appropriate.    Does pt need to still be on this medication?

## 2024-02-12 ENCOUNTER — Ambulatory Visit (HOSPITAL_COMMUNITY)
Admission: RE | Admit: 2024-02-12 | Discharge: 2024-02-12 | Disposition: A | Discharge status: Home / Self Care | Source: Ambulatory Visit | Attending: Cardiology | Admitting: Cardiology

## 2024-02-12 DIAGNOSIS — E859 Amyloidosis, unspecified: Secondary | ICD-10-CM | POA: Diagnosis not present

## 2024-02-12 MED ORDER — TECHNETIUM TC 99M PYROPHOSPHATE
20.7000 | Freq: Once | INTRAVENOUS | Status: AC
  Administered 2024-02-12: 20.7 via INTRAVENOUS

## 2024-02-13 ENCOUNTER — Ambulatory Visit: Payer: Self-pay | Admitting: Cardiology

## 2024-02-13 LAB — MYOCARDIAL AMYLOID PLANAR & SPECT: H/CL Ratio: 1.42

## 2024-02-13 NOTE — Telephone Encounter (Signed)
 Tried to call pt no answer. Pt needs to schedule a CPE for further refills.

## 2024-03-01 ENCOUNTER — Other Ambulatory Visit: Payer: Self-pay

## 2024-03-01 ENCOUNTER — Other Ambulatory Visit: Payer: Self-pay | Admitting: Adult Health

## 2024-03-01 DIAGNOSIS — F339 Major depressive disorder, recurrent, unspecified: Secondary | ICD-10-CM

## 2024-03-01 MED ORDER — ONETOUCH ULTRA TEST VI STRP: ORAL_STRIP | 12 refills | Status: DC

## 2024-03-03 NOTE — Telephone Encounter (Signed)
 Patient need to schedule for more refills.

## 2024-03-12 ENCOUNTER — Inpatient Hospital Stay (HOSPITAL_COMMUNITY)
Admission: EM | Admit: 2024-03-12 | Discharge: 2024-03-18 | DRG: 286 | Disposition: A | Discharge status: Home / Self Care | Attending: Internal Medicine | Admitting: Internal Medicine

## 2024-03-12 ENCOUNTER — Emergency Department (HOSPITAL_COMMUNITY)

## 2024-03-12 ENCOUNTER — Encounter (HOSPITAL_COMMUNITY): Payer: Self-pay

## 2024-03-12 ENCOUNTER — Telehealth: Payer: Self-pay | Admitting: Physician Assistant

## 2024-03-12 ENCOUNTER — Other Ambulatory Visit: Payer: Self-pay

## 2024-03-12 DIAGNOSIS — Z7901 Long term (current) use of anticoagulants: Secondary | ICD-10-CM

## 2024-03-12 DIAGNOSIS — I110 Hypertensive heart disease with heart failure: Secondary | ICD-10-CM | POA: Diagnosis not present

## 2024-03-12 DIAGNOSIS — F17290 Nicotine dependence, other tobacco product, uncomplicated: Secondary | ICD-10-CM | POA: Diagnosis present

## 2024-03-12 DIAGNOSIS — Z8249 Family history of ischemic heart disease and other diseases of the circulatory system: Secondary | ICD-10-CM

## 2024-03-12 DIAGNOSIS — I5082 Biventricular heart failure: Secondary | ICD-10-CM | POA: Diagnosis not present

## 2024-03-12 DIAGNOSIS — I2722 Pulmonary hypertension due to left heart disease: Secondary | ICD-10-CM | POA: Diagnosis not present

## 2024-03-12 DIAGNOSIS — Z803 Family history of malignant neoplasm of breast: Secondary | ICD-10-CM

## 2024-03-12 DIAGNOSIS — N1832 Chronic kidney disease, stage 3b: Secondary | ICD-10-CM | POA: Diagnosis not present

## 2024-03-12 DIAGNOSIS — E1122 Type 2 diabetes mellitus with diabetic chronic kidney disease: Secondary | ICD-10-CM | POA: Diagnosis present

## 2024-03-12 DIAGNOSIS — E876 Hypokalemia: Secondary | ICD-10-CM | POA: Diagnosis present

## 2024-03-12 DIAGNOSIS — F1090 Alcohol use, unspecified, uncomplicated: Secondary | ICD-10-CM | POA: Diagnosis not present

## 2024-03-12 DIAGNOSIS — N183 Chronic kidney disease, stage 3 unspecified: Secondary | ICD-10-CM

## 2024-03-12 DIAGNOSIS — I509 Heart failure, unspecified: Principal | ICD-10-CM

## 2024-03-12 DIAGNOSIS — E66811 Obesity, class 1: Secondary | ICD-10-CM | POA: Diagnosis not present

## 2024-03-12 DIAGNOSIS — F1210 Cannabis abuse, uncomplicated: Secondary | ICD-10-CM | POA: Diagnosis not present

## 2024-03-12 DIAGNOSIS — Z6832 Body mass index (BMI) 32.0-32.9, adult: Secondary | ICD-10-CM

## 2024-03-12 DIAGNOSIS — F1010 Alcohol abuse, uncomplicated: Secondary | ICD-10-CM | POA: Diagnosis not present

## 2024-03-12 DIAGNOSIS — Z7985 Long-term (current) use of injectable non-insulin antidiabetic drugs: Secondary | ICD-10-CM

## 2024-03-12 DIAGNOSIS — J9811 Atelectasis: Secondary | ICD-10-CM | POA: Diagnosis not present

## 2024-03-12 DIAGNOSIS — I3139 Other pericardial effusion (noninflammatory): Secondary | ICD-10-CM | POA: Diagnosis present

## 2024-03-12 DIAGNOSIS — I422 Other hypertrophic cardiomyopathy: Secondary | ICD-10-CM | POA: Diagnosis not present

## 2024-03-12 DIAGNOSIS — E119 Type 2 diabetes mellitus without complications: Secondary | ICD-10-CM

## 2024-03-12 DIAGNOSIS — Z79899 Other long term (current) drug therapy: Secondary | ICD-10-CM

## 2024-03-12 DIAGNOSIS — R9389 Abnormal findings on diagnostic imaging of other specified body structures: Secondary | ICD-10-CM | POA: Diagnosis not present

## 2024-03-12 DIAGNOSIS — N179 Acute kidney failure, unspecified: Secondary | ICD-10-CM | POA: Diagnosis present

## 2024-03-12 DIAGNOSIS — I5033 Acute on chronic diastolic (congestive) heart failure: Secondary | ICD-10-CM | POA: Diagnosis not present

## 2024-03-12 DIAGNOSIS — R7989 Other specified abnormal findings of blood chemistry: Secondary | ICD-10-CM

## 2024-03-12 DIAGNOSIS — I43 Cardiomyopathy in diseases classified elsewhere: Secondary | ICD-10-CM | POA: Diagnosis present

## 2024-03-12 DIAGNOSIS — F32A Depression, unspecified: Secondary | ICD-10-CM | POA: Diagnosis not present

## 2024-03-12 DIAGNOSIS — I130 Hypertensive heart and chronic kidney disease with heart failure and stage 1 through stage 4 chronic kidney disease, or unspecified chronic kidney disease: Principal | ICD-10-CM | POA: Diagnosis present

## 2024-03-12 DIAGNOSIS — I5021 Acute systolic (congestive) heart failure: Secondary | ICD-10-CM | POA: Diagnosis not present

## 2024-03-12 DIAGNOSIS — I2489 Other forms of acute ischemic heart disease: Secondary | ICD-10-CM | POA: Diagnosis not present

## 2024-03-12 DIAGNOSIS — I480 Paroxysmal atrial fibrillation: Secondary | ICD-10-CM | POA: Diagnosis not present

## 2024-03-12 DIAGNOSIS — E11649 Type 2 diabetes mellitus with hypoglycemia without coma: Secondary | ICD-10-CM | POA: Diagnosis not present

## 2024-03-12 DIAGNOSIS — E854 Organ-limited amyloidosis: Secondary | ICD-10-CM | POA: Diagnosis present

## 2024-03-12 DIAGNOSIS — R918 Other nonspecific abnormal finding of lung field: Secondary | ICD-10-CM | POA: Diagnosis not present

## 2024-03-12 DIAGNOSIS — Z794 Long term (current) use of insulin: Secondary | ICD-10-CM | POA: Diagnosis not present

## 2024-03-12 DIAGNOSIS — E785 Hyperlipidemia, unspecified: Secondary | ICD-10-CM | POA: Diagnosis not present

## 2024-03-12 DIAGNOSIS — Z8673 Personal history of transient ischemic attack (TIA), and cerebral infarction without residual deficits: Secondary | ICD-10-CM

## 2024-03-12 DIAGNOSIS — M109 Gout, unspecified: Secondary | ICD-10-CM | POA: Diagnosis present

## 2024-03-12 DIAGNOSIS — E162 Hypoglycemia, unspecified: Secondary | ICD-10-CM | POA: Diagnosis not present

## 2024-03-12 DIAGNOSIS — R0602 Shortness of breath: Secondary | ICD-10-CM | POA: Diagnosis not present

## 2024-03-12 DIAGNOSIS — N189 Chronic kidney disease, unspecified: Secondary | ICD-10-CM | POA: Diagnosis not present

## 2024-03-12 DIAGNOSIS — Z720 Tobacco use: Secondary | ICD-10-CM | POA: Diagnosis present

## 2024-03-12 DIAGNOSIS — Z7984 Long term (current) use of oral hypoglycemic drugs: Secondary | ICD-10-CM

## 2024-03-12 DIAGNOSIS — I639 Cerebral infarction, unspecified: Secondary | ICD-10-CM | POA: Diagnosis present

## 2024-03-12 DIAGNOSIS — D869 Sarcoidosis, unspecified: Secondary | ICD-10-CM | POA: Diagnosis not present

## 2024-03-12 DIAGNOSIS — Z811 Family history of alcohol abuse and dependence: Secondary | ICD-10-CM

## 2024-03-12 DIAGNOSIS — I10 Essential (primary) hypertension: Secondary | ICD-10-CM | POA: Diagnosis present

## 2024-03-12 DIAGNOSIS — F17210 Nicotine dependence, cigarettes, uncomplicated: Secondary | ICD-10-CM | POA: Diagnosis present

## 2024-03-12 LAB — BASIC METABOLIC PANEL WITH GFR
Anion gap: 17 — ABNORMAL HIGH (ref 5–15)
BUN: 25 mg/dL — ABNORMAL HIGH (ref 6–20)
CO2: 22 mmol/L (ref 22–32)
Calcium: 9.7 mg/dL (ref 8.9–10.3)
Chloride: 105 mmol/L (ref 98–111)
Creatinine, Ser: 2.53 mg/dL — ABNORMAL HIGH (ref 0.61–1.24)
GFR, Estimated: 29 mL/min — ABNORMAL LOW (ref >60)
Glucose, Bld: 77 mg/dL (ref 70–99)
Potassium: 4.1 mmol/L (ref 3.5–5.1)
Sodium: 144 mmol/L (ref 135–145)

## 2024-03-12 LAB — CBC
HCT: 42.9 % (ref 39.0–52.0)
Hemoglobin: 13 g/dL (ref 13.0–17.0)
MCH: 29.1 pg (ref 26.0–34.0)
MCHC: 30.3 g/dL (ref 30.0–36.0)
MCV: 96.2 fL (ref 80.0–100.0)
Platelets: 286 K/uL (ref 150–400)
RBC: 4.46 MIL/uL (ref 4.22–5.81)
RDW: 14 % (ref 11.5–15.5)
WBC: 5 K/uL (ref 4.0–10.5)
nRBC: 0 % (ref 0.0–0.2)

## 2024-03-12 LAB — PRO BRAIN NATRIURETIC PEPTIDE: Pro Brain Natriuretic Peptide: 5364 pg/mL — ABNORMAL HIGH (ref <300.0)

## 2024-03-12 LAB — MAGNESIUM: Magnesium: 1.6 mg/dL — ABNORMAL LOW (ref 1.7–2.4)

## 2024-03-12 LAB — TROPONIN T, HIGH SENSITIVITY
Troponin T High Sensitivity: 186 ng/L (ref 0–19)
Troponin T High Sensitivity: 176 ng/L (ref 0–19)

## 2024-03-12 LAB — RESP PANEL BY RT-PCR (RSV, FLU A&B, COVID)  RVPGX2
Influenza A by PCR: NEGATIVE
Influenza B by PCR: NEGATIVE
Resp Syncytial Virus by PCR: NEGATIVE
SARS Coronavirus 2 by RT PCR: NEGATIVE

## 2024-03-12 LAB — GLUCOSE, CAPILLARY: Glucose-Capillary: 83 mg/dL (ref 70–99)

## 2024-03-12 LAB — HEMOGLOBIN A1C
Hgb A1c MFr Bld: 5.4 % (ref 4.8–5.6)
Mean Plasma Glucose: 108.28 mg/dL

## 2024-03-12 LAB — HIV ANTIBODY (ROUTINE TESTING W REFLEX): HIV Screen 4th Generation wRfx: NONREACTIVE

## 2024-03-12 MED ORDER — POTASSIUM CHLORIDE CRYS ER 20 MEQ PO TBCR
20.0000 meq | EXTENDED_RELEASE_TABLET | Freq: Two times a day (BID) | ORAL | Status: DC
  Administered 2024-03-12: 20 meq via ORAL
  Filled 2024-03-12: qty 1

## 2024-03-12 MED ORDER — BISOPROLOL FUMARATE 5 MG PO TABS
20.0000 mg | ORAL_TABLET | Freq: Every morning | ORAL | Status: DC
  Administered 2024-03-13 – 2024-03-18 (×4): 20 mg via ORAL
  Filled 2024-03-12 (×6): qty 4

## 2024-03-12 MED ORDER — THIAMINE MONONITRATE 100 MG PO TABS
100.0000 mg | ORAL_TABLET | Freq: Every day | ORAL | Status: DC
  Administered 2024-03-12 – 2024-03-17 (×6): 100 mg via ORAL
  Filled 2024-03-12 (×7): qty 1

## 2024-03-12 MED ORDER — FUROSEMIDE 10 MG/ML IJ SOLN
20.0000 mg | Freq: Once | INTRAMUSCULAR | Status: AC
  Administered 2024-03-12: 20 mg via INTRAVENOUS
  Filled 2024-03-12: qty 4

## 2024-03-12 MED ORDER — ADULT MULTIVITAMIN W/MINERALS CH
1.0000 | ORAL_TABLET | Freq: Every day | ORAL | Status: DC
  Administered 2024-03-12 – 2024-03-18 (×7): 1 via ORAL
  Filled 2024-03-12 (×7): qty 1

## 2024-03-12 MED ORDER — BUPROPION HCL ER (XL) 150 MG PO TB24
300.0000 mg | ORAL_TABLET | Freq: Every day | ORAL | Status: DC
  Administered 2024-03-12 – 2024-03-18 (×7): 300 mg via ORAL
  Filled 2024-03-12: qty 1
  Filled 2024-03-12 (×5): qty 2
  Filled 2024-03-12: qty 1

## 2024-03-12 MED ORDER — FUROSEMIDE 10 MG/ML IJ SOLN
40.0000 mg | Freq: Two times a day (BID) | INTRAMUSCULAR | Status: DC
  Administered 2024-03-12 – 2024-03-13 (×2): 40 mg via INTRAVENOUS
  Filled 2024-03-12 (×2): qty 4

## 2024-03-12 MED ORDER — NICOTINE 7 MG/24HR TD PT24
7.0000 mg | MEDICATED_PATCH | Freq: Every day | TRANSDERMAL | Status: DC
  Administered 2024-03-12 – 2024-03-16 (×5): 7 mg via TRANSDERMAL
  Filled 2024-03-12 (×7): qty 1

## 2024-03-12 MED ORDER — POLYETHYLENE GLYCOL 3350 17 G PO PACK: 17.0000 g | PACK | Freq: Every day | ORAL | Status: DC | PRN

## 2024-03-12 MED ORDER — LORAZEPAM 2 MG/ML IJ SOLN: 1.0000 mg | INTRAMUSCULAR | Status: AC | PRN

## 2024-03-12 MED ORDER — POTASSIUM CHLORIDE ER 10 MEQ PO TBCR
10.0000 meq | EXTENDED_RELEASE_TABLET | Freq: Every morning | ORAL | Status: DC
Start: 2024-03-13 — End: 2024-03-12
  Filled 2024-03-12: qty 1

## 2024-03-12 MED ORDER — LORAZEPAM 1 MG PO TABS
1.0000 mg | ORAL_TABLET | ORAL | Status: AC | PRN
  Administered 2024-03-12 – 2024-03-14 (×2): 1 mg via ORAL
  Filled 2024-03-12 (×2): qty 1

## 2024-03-12 MED ORDER — FOLIC ACID 1 MG PO TABS
1.0000 mg | ORAL_TABLET | Freq: Every day | ORAL | Status: DC
  Administered 2024-03-12 – 2024-03-18 (×7): 1 mg via ORAL
  Filled 2024-03-12 (×7): qty 1

## 2024-03-12 MED ORDER — ACETAMINOPHEN 325 MG PO TABS: 650.0000 mg | ORAL_TABLET | Freq: Four times a day (QID) | ORAL | Status: DC | PRN

## 2024-03-12 MED ORDER — AMLODIPINE BESYLATE 5 MG PO TABS
5.0000 mg | ORAL_TABLET | Freq: Every day | ORAL | Status: DC
  Administered 2024-03-13 – 2024-03-17 (×5): 5 mg via ORAL
  Filled 2024-03-12 (×5): qty 1

## 2024-03-12 MED ORDER — APIXABAN 5 MG PO TABS
5.0000 mg | ORAL_TABLET | Freq: Two times a day (BID) | ORAL | Status: DC
Start: 2024-03-12 — End: 2024-03-14
  Administered 2024-03-12 – 2024-03-14 (×4): 5 mg via ORAL
  Filled 2024-03-12 (×4): qty 1

## 2024-03-12 MED ORDER — ALLOPURINOL 300 MG PO TABS
300.0000 mg | ORAL_TABLET | Freq: Every day | ORAL | Status: DC
  Administered 2024-03-12 – 2024-03-16 (×5): 300 mg via ORAL
  Filled 2024-03-12 (×4): qty 1
  Filled 2024-03-12: qty 3
  Filled 2024-03-12: qty 1
  Filled 2024-03-12: qty 3

## 2024-03-12 MED ORDER — THIAMINE HCL 100 MG/ML IJ SOLN
100.0000 mg | Freq: Every day | INTRAMUSCULAR | Status: DC
  Administered 2024-03-18: 100 mg via INTRAVENOUS
  Filled 2024-03-12 (×2): qty 2

## 2024-03-12 MED ORDER — PANTOPRAZOLE SODIUM 40 MG PO TBEC
40.0000 mg | DELAYED_RELEASE_TABLET | Freq: Every day | ORAL | Status: DC
Start: 2024-03-12 — End: 2024-03-18
  Administered 2024-03-12 – 2024-03-18 (×7): 40 mg via ORAL
  Filled 2024-03-12 (×7): qty 1

## 2024-03-12 MED ORDER — SODIUM CHLORIDE 0.9% FLUSH
3.0000 mL | Freq: Two times a day (BID) | INTRAVENOUS | Status: DC
  Administered 2024-03-12 – 2024-03-18 (×7): 3 mL via INTRAVENOUS

## 2024-03-12 MED ORDER — ACETAMINOPHEN 650 MG RE SUPP: 650.0000 mg | Freq: Four times a day (QID) | RECTAL | Status: DC | PRN

## 2024-03-12 MED ORDER — ENOXAPARIN SODIUM 60 MG/0.6ML IJ SOSY
60.0000 mg | PREFILLED_SYRINGE | INTRAMUSCULAR | Status: DC
  Administered 2024-03-12: 60 mg via SUBCUTANEOUS
  Filled 2024-03-12: qty 0.6

## 2024-03-12 MED ORDER — ROSUVASTATIN CALCIUM 20 MG PO TABS
20.0000 mg | ORAL_TABLET | Freq: Every day | ORAL | Status: DC
  Administered 2024-03-13 – 2024-03-18 (×6): 20 mg via ORAL
  Filled 2024-03-12 (×6): qty 1

## 2024-03-12 MED ORDER — ALBUTEROL SULFATE (2.5 MG/3ML) 0.083% IN NEBU: 2.5000 mg | INHALATION_SOLUTION | Freq: Four times a day (QID) | RESPIRATORY_TRACT | Status: DC | PRN

## 2024-03-12 MED ORDER — MAGNESIUM SULFATE 2 GM/50ML IV SOLN
2.0000 g | Freq: Once | INTRAVENOUS | Status: AC
  Administered 2024-03-12: 2 g via INTRAVENOUS
  Filled 2024-03-12: qty 50

## 2024-03-12 NOTE — Consult Note (Signed)
 Cardiology Consultation   Patient ID: Keith Leblanc MRN: [id number]; DOB: Oct 14, 1965  Admit date: 03/12/2024 Date of Consult: 03/12/2024  PCP:  [name], NP   [facility] Providers Cardiologist:  Keith Ronal BRAVO, MD (Inactive)        Patient Profile:   Keith Leblanc is a 58 y.o. male with a hx of PAF, hypertension, diabetes mellitus and right MCA stroke in 2024 who is being seen 03/12/2024 for the evaluation of new onset heart failure at the request of Dr. Trenda Mar. History of Present Illness:   Keith Leblanc is a 58 y.o. male patient was initially evaluated for new onset atrial fibrillation on 11/01/2022 in the setting of right MCA CVA, presently on long-term Eliquis , hypertension, diabetes mellitus and paroxysmal atrial fibrillation.  He also has chronic leg edema. Patient has had extensive cardiac workup due to severe LVH including PYP scan for amyloidosis which was negative on 02/12/2024.  He has mildly reduced LVEF at 45 to 50% on 01/27/2024 compared to normal wall motion and EF on 11/09/2022.  He is now admitted to the hospital with worsening shortness of breath over the past 3 months, worsening leg edema and generalized weight gain.  His girlfriend is present at the bedside.  Denies chest pain, PND or orthopnea.  Tele/EKG/Cardiac studies    EKG:  EKG 03/12/2024: Atrial fibrillation with controlled ventricular response EKG 03/12/2024: Atrial fibrillation with controlled ventricular response at the rate of 73 bpm, left intrafascicular block.  Cannot exclude inferior infarct old.  Poor R wave progression, anterolateral infarct old.  Nonspecific T abnormality.  Compared to 04/11/2023, sinus rhythm has been replaced.  Poor R wave progression is more evident.  ECHOCARDIOGRAM COMPLETE 01/27/2024  1. Left ventricular ejection fraction, by estimation, is 45 to 50%. The left ventricle has mildly decreased function. There is severe left ventricular hypertrophy. Hypokinesis  of the mid and base with best preserved function at the apex. Left ventricular diastolic parameters are indeterminate. Elevated left atrial pressure. The E/e' is 20.6. 2. Abnormal RV free wall strain, -17%. Right ventricular systolic function is mildly reduced. The right ventricular size is mildly enlarged. There is mildly elevated pulmonary artery systolic pressure. The estimated right ventricular systolic pressure is 40.8 mmHg. 3. Left atrial size was moderately dilated. 4. Right atrial size was mildly dilated. 5. A small pericardial effusion is present. 6. The mitral valve is grossly normal. Trivial mitral valve regurgitation. No evidence of mitral stenosis. 7. The tricuspid valve is abnormally thickened. Mild TR. 8. The aortic valve is tricuspid. Aortic valve regurgitation is trivial. No aortic stenosis is present. 9. The inferior vena cava is dilated in size with <50% respiratory variability, suggesting right atrial pressure of 15 mmHg.   Conclusion(s)/Recommendation(s): Consider further evaluation of LVH with cardiac MRI if clinically indicated.  MYOCARDIAL AMYLOID PLANAR AND SPECT 02/12/2024    Findings are not suggestive of cardiac ATTR amyloidosis. The myocardium was negative for radiotracer uptake.   The visual grade of myocardial uptake relative to the ribs was Grade 0 (No myocardial uptake and normal bone uptake).   CT images were obtained for attenuation correction and were examined for the presence of coronary calcium  when appropriate.   Coronary calcium  was present on the attenuation correction CT images. Mild coronary calcifications were present. Coronary calcifications were present in the left anterior descending artery distribution(s).   Prior study not available for comparison.  CARDIAC EVENT MONITOR 12/24/2022   Narrative Triggered events for sinus rhythm. Detected 15% afib/flutter.  AV block was asymptomatic and in the early AM.  Radiology  DG Chest 2 View Result Date:  03/12/2024 CLINICAL DATA:  Shortness of breath. EXAM: CHEST - 2 VIEW COMPARISON:  July 07, 2016. FINDINGS: Stable cardiomediastinal silhouette. Left lung is clear. Elevated right hemidiaphragm is noted with mild right basilar subsegmental atelectasis. Small right pleural effusion may be present. Bony thorax is unremarkable. IMPRESSION: Elevated right hemidiaphragm with mild right basilar subsegmental atelectasis. Possible small right pleural effusion. Electronically Signed   By: Lynwood Landy Raddle M.D.   On: 03/12/2024 09:56   Past Medical History:  Diagnosis Date   Blood in stool    bright red blood    Chicken pox    Diabetes mellitus (HCC)    Erectile dysfunction    Gout    Hypertension    Stroke [facility])     Past Surgical History:  Procedure Laterality Date   APPENDECTOMY  2004   COLONOSCOPY     TOOTH EXTRACTION  07/06/2020     Home Medications:  Prior to Admission medications   Medication Sig Start Date End Date Taking? Authorizing Provider  allopurinol  (ZYLOPRIM ) 300 MG tablet TAKE 1 TABLET BY MOUTH DAILY 03/07/23   Nafziger, Darleene, NP  amLODipine  (NORVASC ) 5 MG tablet Take 1 tablet (5 mg total) by mouth daily. 12/11/23   Wyn Jackee VEAR Raddle., NP  apixaban  (ELIQUIS ) 5 MG TABS tablet Take 1 tablet (5 mg total) by mouth 2 (two) times daily. 05/16/23   West, Katlyn D, NP  bisoprolol  (ZEBETA ) 10 MG tablet TAKE 2 TABLETS BY MOUTH DAILY 11/12/23   Merna Darleene, NP  blood glucose meter kit and supplies Dispense based on patient and insurance preference. Use up to four times daily as directed. (FOR ICD-9 250.00, 250.01). 07/09/16   Sebastian Toribio GAILS, MD  buPROPion  (WELLBUTRIN  XL) 300 MG 24 hr tablet TAKE 1 TABLET BY MOUTH ONCE  DAILY 09/09/23   Nafziger, Darleene, NP  colchicine  0.6 MG tablet TAKE 1 TABLET BY MOUTH DAILY  WHEN HAVING FLARES TAKE 1 TABLET BY MOUTH TWICE DAILY Patient taking differently: Take 0.6 mg by mouth as needed. TAKE 1 TABLET BY MOUTH  DAILY WHEN HAVING FLARES  TAKE 1 TABLET BY  MOUTH  TWICE DAILY 09/05/23   Nafziger, Darleene, NP  Dulaglutide  (TRULICITY ) 0.75 MG/0.5ML SOAJ INJECT THE CONTENTS OF ONE PEN  SUBCUTANEOUSLY WEEKLY AS  DIRECTED 10/23/23   Trixie File, MD  fenofibrate  (TRICOR ) 145 MG tablet Take 1 tablet (145 mg total) by mouth daily. 09/13/21   Nafziger, Darleene, NP  furosemide  (LASIX ) 20 MG tablet TAKE 1 TABLET(20 MG) BY MOUTH DAILY 01/08/24   Wyn Jackee VEAR Raddle., NP  glucose blood (ONETOUCH ULTRA TEST) test strip Use to check blood sugar 2-3 times a day. 03/01/24   Trixie File, MD  insulin  glargine (LANTUS  SOLOSTAR) 100 UNIT/ML Solostar Pen Inject 24 Units into the skin daily. 10/23/23   Trixie File, MD  Insulin  Pen Needle (B-D UF III MINI PEN NEEDLES) 31G X 5 MM MISC USE 4 TIMES DAILY AFTER MEALS AND AT BEDTIME 02/24/20   Trixie File, MD  Lancets [facility] ULTRASOFT) lancets Test blood sugars as directed. Dx E11.9 08/06/16   Merna Darleene, NP  magnesium  oxide (MAG-OX) 400 (240 Mg) MG tablet TAKE 1 TABLET(400 MG) BY MOUTH DAILY 02/12/24   West, Katlyn D, NP  magnesium  oxide (MAG-OX) 400 MG tablet Take 1 tablet (400 mg total) by mouth daily. 05/16/23   Keith Ronal BRAVO, MD  Multiple Vitamin (  MULTIVITAMIN WITH MINERALS) TABS tablet Take 1 tablet by mouth daily. 11/11/22   Toberman, Stevi W, NP  Multiple Vitamin (MULTIVITAMIN) tablet Take 1 tablet by mouth daily.    [provider]  omeprazole  (PRILOSEC) 40 MG capsule TAKE 1 CAPSULE BY MOUTH DAILY 11/12/23   Nafziger, Darleene, NP  potassium chloride  (KLOR-CON ) 10 MEQ tablet TAKE 1 TABLET(10 MEQ) BY MOUTH DAILY 01/08/24   Dick, Ernest H Jr., NP  rosuvastatin  (CRESTOR ) 20 MG tablet TAKE 1 TABLET BY MOUTH DAILY 02/13/24   Nafziger, Darleene, NP  sildenafil  (REVATIO ) 20 MG tablet TAKE 2-5 TABLETS BY MOUTH AS NEEDED FOR SEXUAL ACTIVITY Patient taking differently: Take 20 mg by mouth daily as needed (For ED). 08/23/22   Nafziger, Darleene, NP  thiamine  (VITAMIN B-1) 100 MG tablet Take 1 tablet (100 mg total) by mouth daily.  11/11/22   Toberman, Stevi W, NP    Inpatient Medications: Scheduled Meds:  Continuous Infusions:  magnesium  sulfate bolus IVPB 2 g (03/12/24 1348)   PRN Meds:   Allergies:   No Known Allergies  Social History   Tobacco Use   Smoking status: Former   Smokeless tobacco: Never  Substance Use Topics   Alcohol use: Yes    Comment: several times a week  Single     Family History:    Family History  Problem Relation Age of Onset   Alcohol abuse Father    Hypertension Father    Heart disease Father    Gout Father    Breast cancer Mother    Hypertension Mother    Gout Mother    Healthy Sister    Healthy Daughter    Colon cancer Neg Hx    Esophageal cancer Neg Hx    Rectal cancer Neg Hx    Stomach cancer Neg Hx     ROS:  Review of Systems  Constitutional: Positive for weight gain.  Cardiovascular:  Positive for dyspnea on exertion and leg swelling. Negative for chest pain.    Physical Exam    Vitals:   03/12/24 0903 03/12/24 1227  BP: (!) 144/97 123/89  Pulse: 87 67  Resp: 18 18  Temp: 98 F (36.7 C) (!) 97.1 F (36.2 C)  TempSrc: Oral Oral  SpO2: 98% 100%   Physical Exam Neck:     Vascular: Hepatojugular reflux and JVD present. No carotid bruit.  Cardiovascular:     Rate and Rhythm: Normal rate and regular rhythm.     Pulses: Intact distal pulses.     Heart sounds: Normal heart sounds. No murmur heard.    No gallop.  Pulmonary:     Effort: Pulmonary effort is normal.     Breath sounds: Normal breath sounds.  Abdominal:     General: Bowel sounds are normal.     Palpations: Abdomen is soft. There is fluid wave and hepatomegaly.     Tenderness: There is abdominal tenderness in the right upper quadrant. There is no guarding.  Musculoskeletal:     Right lower leg: Edema (3+ Pitting bilateral leg edema) present.     Left lower leg: Edema (3+ Pitting bilateral leg edema) present.     No intake or output data in the 24 hours ending 03/12/24 1447     12/11/2023   11:16 AM 10/23/2023    9:40 AM 08/29/2023    9:30 AM  Last 3 Weights  Weight (lbs) 266 lb 260 lb 9.6 oz 241 lb  Weight (kg) 120.657 kg 118.207 kg 109.317 kg  Net IO Since Admission: No IO data has been entered for this period [03/12/24 1447]  Labs   Lab Results  Component Value Date   NA 144 03/12/2024   K 4.1 03/12/2024   CO2 22 03/12/2024   GLUCOSE 77 03/12/2024   BUN 25 (H) 03/12/2024   CREATININE 2.53 (H) 03/12/2024   CALCIUM  9.7 03/12/2024   GFR 53.61 (L) 12/17/2022   EGFR 56 (L) 12/18/2023   GFRNONAA 29 (L) 03/12/2024       Latest Ref Rng & Units 03/12/2024    9:42 AM 12/18/2023    9:26 AM 10/23/2023   10:05 AM  BMP  Glucose 70 - 99 mg/dL 77  892  879   BUN 6 - 20 mg/dL 25  18  17    Creatinine 0.61 - 1.24 mg/dL 7.46  8.53  8.43   BUN/Creat Ratio 9 - 20  12  11    Sodium 135 - 145 mmol/L 144  142  141   Potassium 3.5 - 5.1 mmol/L 4.1  3.9  5.1   Chloride 98 - 111 mmol/L 105  102  105   CO2 22 - 32 mmol/L 22  21  24    Calcium  8.9 - 10.3 mg/dL 9.7  9.3  9.2        Latest Ref Rng & Units 03/12/2024    9:42 AM 04/01/2023    8:51 AM 12/17/2022    8:42 AM  CBC  WBC 4.0 - 10.5 K/uL 5.0  6.6  3.0   Hemoglobin 13.0 - 17.0 g/dL 86.9  86.9  89.7   Hematocrit 39.0 - 52.0 % 42.9  40.4  32.5   Platelets 150 - 400 K/uL 286  363  317.0     Lab Results  Component Value Date   CHOL 164 10/23/2023   HDL 66 10/23/2023   LDLCALC 76 10/23/2023   LDLDIRECT 72.0 09/12/2021   TRIG 134 10/23/2023   CHOLHDL 2.5 10/23/2023    Lab Results  Component Value Date   TSH 1.813 11/09/2022    Lab Results  Component Value Date   HGBA1C 4.7 10/23/2023    High Sensitivity Troponin:  No results for input(s): TROPONINIHS in the last 720 hours.    ProBNP (last 3 results) Recent Labs    03/12/24 0942  PROBNP 5,364.0*    DDimer No results for input(s): DDIMER in the last 168 hours.   Assessment & Plan .     1.  Acute on chronic diastolic heart failure 2.   Acute on chronic kidney disease stage IIIb due to ATN and cardio-renal syndrome. 3.  Anasarca 4.  Excess alcohol use, drinks about a pint of tequila a day  Recommendations: Will start him on Lasix  40 mg 3 times daily, may need higher dose but will try with 40 for now, will start with Jardiance  and also replace his potassium.  He has generalized anasarca, hepatal jugular reflux and 3+ leg edema.  He needs aggressive diuresis and would also recommend transfer to [facility] as he will need advanced heart failure team to be involved and will eventually need right and left heart catheterization to evaluate his cardiac function and coronary status.  Discussed with the patient and his girlfriend at the bedside regarding lifestyle changes that need to be done, dietary changes and excess alcohol use.  He appears motivated.  Discussed with Dr. Gwynne Commander with heart failure team who has graciously accepted to see the patient tomorrow. D/W Primary team regarding the  plan.   Gordy Bergamo, MD, [facility] 03/12/2024, 2:47 PM [facility] [address] [place], [place] [address] Phone: [phone]. Fax:  [phone]

## 2024-03-12 NOTE — ED Triage Notes (Signed)
 Patient here from home reporting ongoing issues with sob for  months increase on exertion. Seen by cardiology with no relief. Increased at night.

## 2024-03-12 NOTE — Telephone Encounter (Signed)
   The patient called the answering service after-hours this morning. Since August, he has been experiencing worsening SOB, DOE, and now progressed to orthopnea/PND. Given the severity of symptoms I have advised proceeding to the ER for evaluation. The patient verbalized understanding and gratitude. He plans to get dressed and go. Will cc to Jackee Alberts, NP, as RICK.  Warden Buffa N Xochilt Conant, PA-C

## 2024-03-12 NOTE — ED Provider Notes (Signed)
 [facility]MENT AT [facility] Provider Note   CSN: [id number] Arrival date & time: 03/12/24  9141     Patient presents with: Shortness of Breath   Keith Leblanc is a 58 y.o. male.   Pt is a 58 yo male with pmhx significant for afib (on Eliquis ), HTN, DM, CVA, gout, and depression.  Pt has been having increasing SOB for the past month.  He's seen cardiology and had an ECHO on 8/5 which showed:  Mildly reduced EF 45 to 50%.  There are multiple findings concerning for possible cardiac amyloidosis with severe LVH, regional wall motion abnormalities, and elevated E to E ratio.  RV is mildly reduced.  RA pressure is also elevated significantly at 15 which may suggest excess volume.  On 8/21, he had an amyloid study with [facility] which was not suggestive of cardiac amyloidosis.   He was supposed to get a blood test for multiple myeloma, but has not yet.   Pt said SOB has been bad for the past week.  He can't go to the grocery store without sob.  He tried to go to the [employer] this weekend and could not walk more than a short bit and had to go home.  He could not sleep last night due to sob.  He said he'd fall asleep, then wake up feeling like he could not breathe (orthopnea).  His legs are swollen up to his thighs.  Today, he had to park far away in the parking garage and did not think he'd make it to the ED.  He almost went home, but thought that if he did not make it, he was close to the ED and someone would find him.  He does take lasix  20 mg daily.  He's compliant with that as well as his other meds.       Prior to Admission medications   Medication Sig Start Date End Date Taking? Authorizing Provider  allopurinol  (ZYLOPRIM ) 300 MG tablet TAKE 1 TABLET BY MOUTH DAILY 03/07/23   Nafziger, Darleene, NP  amLODipine  (NORVASC ) 5 MG tablet Take 1 tablet (5 mg total) by mouth daily. 12/11/23   Wyn Jackee VEAR Mickey., NP  apixaban  (ELIQUIS ) 5 MG TABS tablet Take 1 tablet (5  mg total) by mouth 2 (two) times daily. 05/16/23   West, Katlyn D, NP  bisoprolol  (ZEBETA ) 10 MG tablet TAKE 2 TABLETS BY MOUTH DAILY 11/12/23   Merna Darleene, NP  blood glucose meter kit and supplies Dispense based on patient and insurance preference. Use up to four times daily as directed. (FOR ICD-9 250.00, 250.01). 07/09/16   Sebastian Toribio GAILS, MD  buPROPion  (WELLBUTRIN  XL) 300 MG 24 hr tablet TAKE 1 TABLET BY MOUTH ONCE  DAILY 09/09/23   Nafziger, Darleene, NP  colchicine  0.6 MG tablet TAKE 1 TABLET BY MOUTH DAILY  WHEN HAVING FLARES TAKE 1 TABLET BY MOUTH TWICE DAILY Patient taking differently: Take 0.6 mg by mouth as needed. TAKE 1 TABLET BY MOUTH  DAILY WHEN HAVING FLARES  TAKE 1 TABLET BY MOUTH  TWICE DAILY 09/05/23   Nafziger, Darleene, NP  Dulaglutide  (TRULICITY ) 0.75 MG/0.5ML SOAJ INJECT THE CONTENTS OF ONE PEN  SUBCUTANEOUSLY WEEKLY AS  DIRECTED 10/23/23   Trixie File, MD  fenofibrate  (TRICOR ) 145 MG tablet Take 1 tablet (145 mg total) by mouth daily. 09/13/21   Nafziger, Darleene, NP  furosemide  (LASIX ) 20 MG tablet TAKE 1 TABLET(20 MG) BY MOUTH DAILY 01/08/24   Dick, Ernest H Jr.,  NP  glucose blood (ONETOUCH ULTRA TEST) test strip Use to check blood sugar 2-3 times a day. 03/01/24   Trixie File, MD  insulin  glargine (LANTUS  SOLOSTAR) 100 UNIT/ML Solostar Pen Inject 24 Units into the skin daily. 10/23/23   Trixie File, MD  Insulin  Pen Needle (B-D UF III MINI PEN NEEDLES) 31G X 5 MM MISC USE 4 TIMES DAILY AFTER MEALS AND AT BEDTIME 02/24/20   Trixie File, MD  Lancets [facility] ULTRASOFT) lancets Test blood sugars as directed. Dx E11.9 08/06/16   [name], NP  magnesium  oxide (MAG-OX) 400 (240 Mg) MG tablet TAKE 1 TABLET(400 MG) BY MOUTH DAILY 02/12/24   West, Katlyn D, NP  magnesium  oxide (MAG-OX) 400 MG tablet Take 1 tablet (400 mg total) by mouth daily. 05/16/23   Alvan Ronal BRAVO, MD  Multiple Vitamin (MULTIVITAMIN WITH MINERALS) TABS tablet Take 1 tablet by mouth daily. 11/11/22    Toberman, Stevi W, NP  Multiple Vitamin (MULTIVITAMIN) tablet Take 1 tablet by mouth daily.    [provider]  omeprazole  (PRILOSEC) 40 MG capsule TAKE 1 CAPSULE BY MOUTH DAILY 11/12/23   Nafziger, Huxley, NP  potassium chloride  (KLOR-CON ) 10 MEQ tablet TAKE 1 TABLET(10 MEQ) BY MOUTH DAILY 01/08/24   Dick, Ernest H Jr., NP  rosuvastatin  (CRESTOR ) 20 MG tablet TAKE 1 TABLET BY MOUTH DAILY 02/13/24   Nafziger, Huxley, NP  sildenafil  (REVATIO ) 20 MG tablet TAKE 2-5 TABLETS BY MOUTH AS NEEDED FOR SEXUAL ACTIVITY Patient taking differently: Take 20 mg by mouth daily as needed (For ED). 08/23/22   Nafziger, Huxley, NP  thiamine  (VITAMIN B-1) 100 MG tablet Take 1 tablet (100 mg total) by mouth daily. 11/11/22   Toberman, Stevi W, NP    Allergies: Patient has no known allergies.    Review of Systems  Respiratory:  Positive for shortness of breath.   Cardiovascular:  Positive for leg swelling.  All other systems reviewed and are negative.   Updated Vital Signs BP 123/89 (BP Location: Right Arm)   Pulse 67   Temp (!) 97.1 F (36.2 C) (Oral)   Resp 18   SpO2 100%   Physical Exam Vitals and nursing note reviewed.  Constitutional:      Appearance: He is well-developed.  HENT:     Head: Normocephalic and atraumatic.     Mouth/Throat:     Mouth: Mucous membranes are moist.     Pharynx: Oropharynx is clear.  Eyes:     Extraocular Movements: Extraocular movements intact.     Pupils: Pupils are equal, round, and reactive to light.  Cardiovascular:     Rate and Rhythm: Normal rate. Rhythm irregular.  Pulmonary:     Effort: Pulmonary effort is normal.     Breath sounds: Rhonchi present.  Abdominal:     General: Bowel sounds are normal.     Palpations: Abdomen is soft.  Musculoskeletal:        General: Normal range of motion.     Cervical back: Normal range of motion and neck supple.     Right lower leg: Edema present.     Left lower leg: Edema present.  Skin:    General: Skin is warm.      Capillary Refill: Capillary refill takes less than 2 seconds.  Neurological:     General: No focal deficit present.     Mental Status: He is alert and oriented to person, place, and time.  Psychiatric:        Mood and Affect:  Mood normal.        Behavior: Behavior normal.     (all labs ordered are listed, but only abnormal results are displayed) Labs Reviewed  BASIC METABOLIC PANEL WITH GFR - Abnormal; Notable for the following components:      Result Value   BUN 25 (*)    Creatinine, Ser 2.53 (*)    GFR, Estimated 29 (*)    Anion gap 17 (*)    All other components within normal limits  PRO BRAIN NATRIURETIC PEPTIDE - Abnormal; Notable for the following components:   Pro Brain Natriuretic Peptide 5,364.0 (*)    All other components within normal limits  MAGNESIUM  - Abnormal; Notable for the following components:   Magnesium  1.6 (*)    All other components within normal limits  TROPONIN T, HIGH SENSITIVITY - Abnormal; Notable for the following components:   Troponin T High Sensitivity 186 (*)    All other components within normal limits  TROPONIN T, HIGH SENSITIVITY - Abnormal; Notable for the following components:   Troponin T High Sensitivity 176 (*)    All other components within normal limits  RESP PANEL BY RT-PCR (RSV, FLU A&B, COVID)  RVPGX2  CBC  MULTIPLE MYELOMA PANEL, SERUM    EKG: EKG Interpretation Date/Time:  Friday March 12 2024 09:07:08 EDT Ventricular Rate:  73 PR Interval:    QRS Duration:  84 QT Interval:  414 QTC Calculation: 457 R Axis:   -59  Text Interpretation: Atrial fibrillation Inferior infarct, old Anterior infarct, old No significant change since last tracing Confirmed by Dean Clarity [phone]) on 03/12/2024 9:16:53 AM  Radiology: [facility] Chest 2 View Result Date: 03/12/2024 CLINICAL DATA:  Shortness of breath. EXAM: CHEST - 2 VIEW COMPARISON:  July 07, 2016. FINDINGS: Stable cardiomediastinal silhouette. Left lung is clear. Elevated  right hemidiaphragm is noted with mild right basilar subsegmental atelectasis. Small right pleural effusion may be present. Bony thorax is unremarkable. IMPRESSION: Elevated right hemidiaphragm with mild right basilar subsegmental atelectasis. Possible small right pleural effusion. Electronically Signed   By: Lynwood Landy Raddle M.D.   On: 03/12/2024 09:56     Procedures   Medications Ordered in the ED  magnesium  sulfate IVPB 2 g 50 mL (2 g Intravenous New Bag/Given 03/12/24 1348)  furosemide  (LASIX ) injection 20 mg (20 mg Intravenous Given 03/12/24 0940)                                    Medical Decision Making Amount and/or Complexity of Data Reviewed Labs: ordered. Radiology: ordered.  Risk Prescription drug management. Decision regarding hospitalization.   This patient presents to the ED for concern of sob, this involves an extensive number of treatment options, and is a complaint that carries with it a high risk of complications and morbidity.  The differential diagnosis includes chf exac, mi, pna, covid/flu/rsv   Co morbidities that complicate the patient evaluation  afib (on Eliquis ), HTN, DM, CVA, gout, and depression   Additional history obtained:  Additional history obtained from epic chart review  Lab Tests:  I Ordered, and personally interpreted labs.  The pertinent results include:  cbc nl, bmp nl other than bun elevated at 25 and cr. Elevated at 2.53 (cr 1.46 on 6/26); mg low at 1.6; trop elevated at 186; BNP elevated at 5364; trop elevated, but flat   Imaging Studies ordered:  I ordered imaging studies including cxr  I  independently visualized and interpreted imaging which showed  Elevated right hemidiaphragm with mild right basilar subsegmental  atelectasis. Possible small right pleural effusion.   I agree with the radiologist interpretation   Cardiac Monitoring:  The patient was maintained on a cardiac monitor.  I personally viewed and interpreted the  cardiac monitored which showed an underlying rhythm of: nsr   Medicines ordered and prescription drug management:  I ordered medication including lasix   for sob and iv mg for low mg Reevaluation of the patient after these medicines showed that the patient improved I have reviewed the patients home medicines and have made adjustments as needed   Critical Interventions:  lasix    Consultations Obtained:  I requested consultation with the cardiologist (Dr. Burton),  and discussed lab and imaging findings as well as pertinent plan - he recommended that pt stay at [facility].  Cards will see in the am. Pt d/w Dr. Judeth (triad) for admission.   Problem List / ED Course:  CHF: sob likely due to sob.  Pt given iv lasix .  He is not requiring supplemental oxygen. AKI:  new from June.  Etiology unclear.  No recent med changes Elevated troponin:  no cp.  Trop is flat.   Reevaluation:  After the interventions noted above, I reevaluated the patient and found that they have :improved   Social Determinants of Health:  Lives at home   Dispostion:  After consideration of the diagnostic results and the patients response to treatment, I feel that the patent would benefit from admission.  CRITICAL CARE Performed by: Mliss Boyers   Total critical care time: 30 minutes  Critical care time was exclusive of separately billable procedures and treating other patients.  Critical care was necessary to treat or prevent imminent or life-threatening deterioration.  Critical care was time spent personally by me on the following activities: development of treatment plan with patient and/or surrogate as well as nursing, discussions with consultants, evaluation of patient's response to treatment, examination of patient, obtaining history from patient or surrogate, ordering and performing treatments and interventions, ordering and review of laboratory studies, ordering and review of radiographic studies,  pulse oximetry and re-evaluation of patient's condition.        Final diagnoses:  Acute congestive heart failure, unspecified heart failure type (HCC)  Hypomagnesemia  AKI (acute kidney injury) (HCC)  Elevated troponin    ED Discharge Orders     None          Boyers Mliss, MD 03/12/24 1446

## 2024-03-12 NOTE — ED Notes (Signed)
 Pt currently in x ray. Unable to obtain labs

## 2024-03-12 NOTE — H&P (Addendum)
 History and Physical    Mac Dowdell FMW:[id number] DOB: 13-May-1966 DOA: 03/12/2024  PCP: [name], NP   I have briefly reviewed patients previous medical reports in [facility].  Patient coming from: Home  Chief Complaint: Dyspnea on exertion, orthopnea, PND, abdominal swelling, leg edema.  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  He reports that his symptoms for started around April - May 2025 as dyspnea when he did his usual activities such as putting out the trash can.  He started noticing bilateral leg edema about the same time but attributed it to his sedentary work life/sitting.  Over the next several weeks, he noticed progressively worsening dyspnea on exertion and decreasing effort tolerance.  He states that even when he had to shop with his girlfriend in [place], he had to take rest breaks, use a cart to walk and several times sat in the car due to concern that he would get dyspnea and have panic attack in the store.  Approximately a week ago, while he was at the [facility], he noticed that he had to stop and take rest breaks every 20 minutes due to his dyspnea.  2 weeks ago, girlfriend noticed that he was wheezing all night.  Within the last week, he reports orthopnea, having unrested sleep, having to wake up several times in the night due to dyspnea.  He denies any chest pain, palpitations, dizziness.  He reports compliance to his home diuretic regimen but does not have much urine output.  He drinks fluids  liberally up to 3.5 L/day, liberal salt intake.  He does not weigh himself daily.  He also reports abdominal distention.  ED Course: Afebrile and vital signs stable.  Lab work significant for BUN 25, creatinine 2.53, anion gap 17, magnesium  1.6, proBNP 5364, troponin T high-sensitivity 186 > 176.  Normal CBCs.  Blood glucose 77.  Flu, RSV and COVID-19 panel negative.   Chest x-ray: Elevated right hemidiaphragm with mild right basilar subsegmental atelectasis. Possible small right pleural effusion.  EKG personally reviewed: Low voltage, A-fib with ventricular rate of 73 bpm, LAD, Q waves in inferior leads, slow R wave progression.  QTcB: 457 ms.  He was given a dose of Lasix  20 mg IV x 1, magnesium  2 g IV x 1 and hospitalist admission was requested.  Review of Systems:  All other systems reviewed and apart from HPI, are negative.  Past Medical History:  Diagnosis Date   Blood in stool    bright red blood    Chicken pox    Diabetes mellitus (HCC)    Erectile dysfunction    Gout    Hypertension    Stroke [facility])     Past Surgical History:  Procedure Laterality Date   APPENDECTOMY  2004   COLONOSCOPY     TOOTH EXTRACTION  07/06/2020    Social History  reports that he has been smoking cigarettes and cigars. He has never used smokeless tobacco. He reports current alcohol use. He reports current drug use. Drug: Marijuana.  No Known Allergies  Family History  Problem Relation Age of Onset   Alcohol abuse  Father    Hypertension Father    Heart disease Father    Gout Father    Breast cancer Mother    Hypertension Mother    Gout Mother    Healthy Sister    Healthy Daughter    Colon cancer Neg Hx    Esophageal cancer Neg Hx    Rectal cancer Neg Hx    Stomach cancer Neg Hx      Prior to Admission medications   Medication Sig Start Date End Date Taking? Authorizing Provider  furosemide  (LASIX ) 20 MG tablet TAKE 1 TABLET(20 MG) BY MOUTH DAILY 01/08/24  Yes Wyn Jackee VEAR Mickey.,  NP  magnesium  oxide (MAG-OX) 400 MG tablet Take 1 tablet (400 mg total) by mouth daily. 05/16/23  Yes BranchRonal BRAVO, MD  Multiple Vitamins-Minerals (ONE-A-DAY MENS 50+) TABS Take 1 tablet by mouth daily with breakfast.   Yes [provider]  potassium chloride  (KLOR-CON ) 10 MEQ tablet TAKE 1 TABLET(10 MEQ) BY MOUTH DAILY 01/08/24  Yes Wyn Jackee VEAR Mickey., NP  thiamine  (VITAMIN B-1) 100 MG tablet Take 1 tablet (100 mg total) by mouth daily. 11/11/22  Yes Toberman, Stevi W, NP  allopurinol  (ZYLOPRIM ) 300 MG tablet TAKE 1 TABLET BY MOUTH DAILY 03/07/23   Nafziger, Darleene, NP  amLODipine  (NORVASC ) 5 MG tablet Take 1 tablet (5 mg total) by mouth daily. 12/11/23   Wyn Jackee VEAR Mickey., NP  apixaban  (ELIQUIS ) 5 MG TABS tablet Take 1 tablet (5 mg total) by mouth 2 (two) times daily. 05/16/23   West, Katlyn D, NP  bisoprolol  (ZEBETA ) 10 MG tablet TAKE 2 TABLETS BY MOUTH DAILY 11/12/23   Merna Darleene, NP  blood glucose meter kit and supplies Dispense based on patient and insurance preference. Use up to four times daily as directed. (FOR ICD-9 250.00, 250.01). 07/09/16   Sebastian Toribio GAILS, MD  buPROPion  (WELLBUTRIN  XL) 300 MG 24 hr tablet TAKE 1 TABLET BY MOUTH ONCE  DAILY 09/09/23   Nafziger, Darleene, NP  colchicine  0.6 MG tablet TAKE 1 TABLET BY MOUTH DAILY  WHEN HAVING FLARES TAKE 1 TABLET BY MOUTH TWICE DAILY Patient taking differently: Take 0.6 mg by mouth as needed. TAKE 1 TABLET BY MOUTH  DAILY WHEN HAVING FLARES  TAKE 1 TABLET BY MOUTH  TWICE DAILY 09/05/23   Nafziger, Darleene, NP  Dulaglutide  (TRULICITY ) 0.75 MG/0.5ML SOAJ INJECT THE CONTENTS OF ONE PEN  SUBCUTANEOUSLY WEEKLY AS  DIRECTED 10/23/23   Trixie File, MD  fenofibrate  (TRICOR ) 145 MG tablet Take 1 tablet (145 mg total) by mouth daily. 09/13/21   Nafziger, Darleene, NP  glucose blood (ONETOUCH ULTRA TEST) test strip Use to check blood sugar 2-3 times a day. 03/01/24   Trixie File, MD  insulin  glargine (LANTUS  SOLOSTAR) 100 UNIT/ML Solostar Pen  Inject 24 Units into the skin daily. 10/23/23   Trixie File, MD  Insulin  Pen Needle (B-D UF III MINI PEN NEEDLES) 31G X 5 MM MISC USE 4 TIMES DAILY AFTER MEALS AND AT BEDTIME 02/24/20   Trixie File, MD  Lancets [facility] ULTRASOFT) lancets Test blood sugars as directed. Dx E11.9 08/06/16   Merna Darleene, NP  magnesium  oxide (MAG-OX) 400 (240 Mg) MG tablet TAKE 1 TABLET(400 MG) BY MOUTH DAILY 02/12/24   West, Katlyn D, NP  Multiple Vitamin (MULTIVITAMIN WITH MINERALS) TABS tablet Take 1 tablet by mouth daily. Patient not taking: Reported on 03/12/2024 11/11/22   Toberman, Stevi W, NP  omeprazole  (PRILOSEC) 40 MG capsule TAKE 1 CAPSULE BY MOUTH  DAILY 11/12/23   Nafziger, Darleene, NP  rosuvastatin  (CRESTOR ) 20 MG tablet TAKE 1 TABLET BY MOUTH DAILY 02/13/24   Nafziger, Darleene, NP  sildenafil  (REVATIO ) 20 MG tablet TAKE 2-5 TABLETS BY MOUTH AS NEEDED FOR SEXUAL ACTIVITY Patient taking differently: Take 20 mg by mouth daily as needed (For ED). 08/23/22   Merna Darleene, NP    Physical Exam: Vitals:   03/12/24 0903 03/12/24 1227 03/12/24 1600 03/12/24 1639  BP: (!) 144/97 123/89  124/87  Pulse: 87 67  62  Resp: 18 18  20   Temp: 98 F (36.7 C) (!) 97.1 F (36.2 C)  98.1 F (36.7 C)  TempSrc: Oral Oral  Oral  SpO2: 98% 100%  97%  Weight:   126.2 kg   Height:   6' 5 (1.956 m)       Constitutional: Young male, moderately built and obese lying comfortably propped up in bed without distress. Eyes: PERTLA, lids and conjunctivae normal.  Bilateral immature cataracts. ENMT: Mucous membranes are moist. Posterior pharynx clear of any exudate or lesions. Normal dentition.  Neck: supple, no masses, no thyromegaly Respiratory: Diminished breath sounds in the bases, right>left.  Occasional basal crackles.  Rest of lung fields clear to auscultation.  No increased work of breathing. Cardiovascular: S1 & S2 heard, irregularly irregular. No JVD, murmurs, rubs or clicks.  Anasarca.  2-3+ pitting bilateral leg  edema, Band-Aid over what appears to be a ruptured fluid blister over right shin, bilateral thigh edema and presacral edema. Abdomen: Non distended. Non tender. Soft. No organomegaly or masses appreciated. No clinical Ascites. Normal bowel sounds heard. Musculoskeletal: no clubbing / cyanosis. No joint deformity upper and lower extremities. Good ROM, no contractures. Normal muscle tone.  Skin: no rashes, lesions, ulcers. No induration Neurologic: CN 2-12 grossly intact. Sensation intact, DTR normal. Strength 5/5 in all 4 limbs.  Psychiatric: Normal judgment and insight. Alert and oriented x 3. Normal mood.     Labs on Admission: I have personally reviewed following labs and imaging studies  CBC: Recent Labs  Lab 03/12/24 0942  WBC 5.0  HGB 13.0  HCT 42.9  MCV 96.2  PLT 286    Basic Metabolic Panel: Recent Labs  Lab 03/12/24 0942  NA 144  K 4.1  CL 105  CO2 22  GLUCOSE 77  BUN 25*  CREATININE 2.53*  CALCIUM  9.7  MG 1.6*    Liver Function Tests: No results for input(s): AST, ALT, ALKPHOS, BILITOT, PROT, ALBUMIN in the last 168 hours.  Urine analysis:    Component Value Date/Time   COLORURINE AMBER (A) 06/18/2018 1445   APPEARANCEUR CLOUDY (A) 06/18/2018 1445   LABSPEC 1.029 06/18/2018 1445   PHURINE 5.0 06/18/2018 1445   GLUCOSEU >=500 (A) 06/18/2018 1445   HGBUR NEGATIVE 06/18/2018 1445   BILIRUBINUR NEGATIVE 06/18/2018 1445   KETONESUR 20 (A) 06/18/2018 1445   PROTEINUR NEGATIVE 06/18/2018 1445   NITRITE NEGATIVE 06/18/2018 1445   LEUKOCYTESUR NEGATIVE 06/18/2018 1445     Radiological Exams on Admission: DG Chest 2 View Result Date: 03/12/2024 CLINICAL DATA:  Shortness of breath. EXAM: CHEST - 2 VIEW COMPARISON:  July 07, 2016. FINDINGS: Stable cardiomediastinal silhouette. Left lung is clear. Elevated right hemidiaphragm is noted with mild right basilar subsegmental atelectasis. Small right pleural effusion may be present. Bony thorax is  unremarkable. IMPRESSION: Elevated right hemidiaphragm with mild right basilar subsegmental atelectasis. Possible small right pleural effusion. Electronically Signed   By: Lynwood Landy Raddle M.D.   On: 03/12/2024  09:56      Assessment/Plan Principal Problem:   Acute on chronic diastolic CHF (congestive heart failure) (HCC) Active Problems:   Essential hypertension   Tobacco abuse   Stroke (cerebrum) (HCC)   Alcohol use disorder   Marijuana abuse   Controlled diabetes mellitus (HCC)   Acute kidney injury superimposed on chronic kidney disease (HCC)   Paroxysmal A-fib (HCC)   Dyslipidemia     Acute on chronic diastolic CHF TTE 06/27/7972: LVEF 45-50%, severe LVH, wall motion abnormalities.  Left ventricular diastolic parameters were indeterminate.  Defer decision regarding repeating echo to cardiology. PYP scan for amyloidosis which was negative on 02/12/2024.  Heart failure pathway initiated.  Strict intake output, daily weight, fluid restriction up to 2 L/day. Will need much higher doses of IV Lasix  than was given in ED.  Discussed with Dr. Ladona, Cardiology and defer diuretic management to him. As per Dr. Ladona, he will likely need transfer to [facility] tomorrow so that he can be closely followed by the advanced heart failure team, may benefit from [facility].  Transfer can be done tomorrow. GDMT pending home medication reconciliation by pharmacy and as per cardiology  Elevated troponins due to demand ischemia No anginal symptoms.  EKG without acute findings. Cardiology considering LHC/RHC this admission.  Acute kidney injury complicating stage IIIa CKD Baseline creatinine is probably in the 1.4-1.6 range.  Presented with creatinine of 2.53. Possibly cardiorenal etiology. Follow BMP daily with aggressive IV Lasix  diuresis. Avoid hypotension and nephrotoxic agents Will need Nephrology consultation and follow-up as outpatient.  Hypomagnesemia Replaced.  Follow magnesium  in AM.  Essential  hypertension Controlled.  Resume home meds pending review by pharmacy  Dyslipidemia Resume home meds pending review by pharmacy  Type II DM, controlled A1c 4.7 on 5/1 suggests supertight control.  Not sure if he is taking any insulins or other antidiabetics at home. For now only monitor CBGs and if consistently >180 then consider very sensitive SSI. Checking repeat A1c Sees Dr. Tawni Fendt, Endocrinologist  Paroxysmal atrial fibrillation Rate controlled.  Monitor on telemetry. Pending home med reconciliation by pharmacy, resume Eliquis   History of stroke Anticoagulation discussion as above and statins as above.  Polysubstance abuse (cigars, alcohol, marijuana) Cessation counseled Nicotine  patch at patient's request CIWA protocol initiated.   DVT prophylaxis: Subcutaneous Lovenox  Code Status: Full, confirmed with patient in the presence of his girlfriend at bedside. Family Communication: Girlfriend was present at bedside throughout the course of his evaluation.  Updated care and answered all questions. Disposition Plan:   Patient is from:  Home  Anticipated DC to:  Home  Anticipated DC date:  4 to 5 days  Anticipated DC barriers: None   Consults called: Cardiology Admission status: Inpatient, telemetry  Severity of Illness: The appropriate patient status for this patient is INPATIENT. Inpatient status is judged to be reasonable and necessary in order to provide the required intensity of service to ensure the patient's safety. The patient's presenting symptoms, physical exam findings, and initial radiographic and laboratory data in the context of their chronic comorbidities is felt to place them at high risk for further clinical deterioration. Furthermore, it is not anticipated that the patient will be medically stable for discharge from the hospital within 2 midnights of admission.   * I certify that at the point of admission it is my clinical judgment that the patient  will require inpatient hospital care spanning beyond 2 midnights from the point of admission due to high intensity of service, high risk for  further deterioration and high frequency of surveillance required.*    Trenda Mar MD FACP, [facility], [facility], [facility]   [facility] [place]    To contact the attending provider between 7A-7P or the covering provider during after hours 7P-7A, please log into the web site [email or web address] and access using universal [place] password for that web site. If you do not have the password, please call the hospital operator.  03/12/2024, 4:55 PM

## 2024-03-13 DIAGNOSIS — N179 Acute kidney failure, unspecified: Secondary | ICD-10-CM | POA: Diagnosis not present

## 2024-03-13 DIAGNOSIS — E876 Hypokalemia: Secondary | ICD-10-CM

## 2024-03-13 DIAGNOSIS — Z794 Long term (current) use of insulin: Secondary | ICD-10-CM

## 2024-03-13 DIAGNOSIS — E11649 Type 2 diabetes mellitus with hypoglycemia without coma: Secondary | ICD-10-CM | POA: Diagnosis not present

## 2024-03-13 DIAGNOSIS — I5033 Acute on chronic diastolic (congestive) heart failure: Secondary | ICD-10-CM | POA: Diagnosis not present

## 2024-03-13 DIAGNOSIS — I5082 Biventricular heart failure: Secondary | ICD-10-CM

## 2024-03-13 LAB — GLUCOSE, CAPILLARY
Glucose-Capillary: 60 mg/dL — ABNORMAL LOW (ref 70–99)
Glucose-Capillary: 72 mg/dL (ref 70–99)
Glucose-Capillary: 132 mg/dL — ABNORMAL HIGH (ref 70–99)
Glucose-Capillary: 137 mg/dL — ABNORMAL HIGH (ref 70–99)
Glucose-Capillary: 131 mg/dL — ABNORMAL HIGH (ref 70–99)
Glucose-Capillary: 174 mg/dL — ABNORMAL HIGH (ref 70–99)

## 2024-03-13 LAB — CBC
HCT: 34.5 % — ABNORMAL LOW (ref 39.0–52.0)
Hemoglobin: 11.2 g/dL — ABNORMAL LOW (ref 13.0–17.0)
MCH: 30.7 pg (ref 26.0–34.0)
MCHC: 32.5 g/dL (ref 30.0–36.0)
MCV: 94.5 fL (ref 80.0–100.0)
Platelets: 264 K/uL (ref 150–400)
RBC: 3.65 MIL/uL — ABNORMAL LOW (ref 4.22–5.81)
RDW: 13.9 % (ref 11.5–15.5)
WBC: 4.8 K/uL (ref 4.0–10.5)
nRBC: 0 % (ref 0.0–0.2)

## 2024-03-13 LAB — BASIC METABOLIC PANEL WITH GFR
Anion gap: 13 (ref 5–15)
BUN: 24 mg/dL — ABNORMAL HIGH (ref 6–20)
CO2: 26 mmol/L (ref 22–32)
Calcium: 9.3 mg/dL (ref 8.9–10.3)
Chloride: 103 mmol/L (ref 98–111)
Creatinine, Ser: 2.63 mg/dL — ABNORMAL HIGH (ref 0.61–1.24)
GFR, Estimated: 28 mL/min — ABNORMAL LOW (ref >60)
Glucose, Bld: 53 mg/dL — ABNORMAL LOW (ref 70–99)
Potassium: 3.2 mmol/L — ABNORMAL LOW (ref 3.5–5.1)
Sodium: 142 mmol/L (ref 135–145)

## 2024-03-13 LAB — MAGNESIUM: Magnesium: 1.7 mg/dL (ref 1.7–2.4)

## 2024-03-13 MED ORDER — FUROSEMIDE 10 MG/ML IJ SOLN
80.0000 mg | Freq: Two times a day (BID) | INTRAMUSCULAR | Status: DC
  Administered 2024-03-13 – 2024-03-15 (×4): 80 mg via INTRAVENOUS
  Filled 2024-03-13 (×4): qty 8

## 2024-03-13 MED ORDER — POTASSIUM CHLORIDE CRYS ER 10 MEQ PO TBCR
40.0000 meq | EXTENDED_RELEASE_TABLET | Freq: Two times a day (BID) | ORAL | Status: DC
  Administered 2024-03-13 – 2024-03-18 (×11): 40 meq via ORAL
  Filled 2024-03-13: qty 2
  Filled 2024-03-13: qty 4
  Filled 2024-03-13: qty 2
  Filled 2024-03-13 (×3): qty 4
  Filled 2024-03-13 (×2): qty 2
  Filled 2024-03-13: qty 4
  Filled 2024-03-13: qty 2

## 2024-03-13 MED ORDER — MAGNESIUM SULFATE 2 GM/50ML IV SOLN
2.0000 g | Freq: Once | INTRAVENOUS | Status: AC
  Administered 2024-03-13: 2 g via INTRAVENOUS
  Filled 2024-03-13: qty 50

## 2024-03-13 MED ORDER — EMPAGLIFLOZIN 10 MG PO TABS
10.0000 mg | ORAL_TABLET | Freq: Every day | ORAL | Status: DC
  Administered 2024-03-13 – 2024-03-18 (×6): 10 mg via ORAL
  Filled 2024-03-13 (×6): qty 1

## 2024-03-13 MED ORDER — FUROSEMIDE 10 MG/ML IJ SOLN
40.0000 mg | Freq: Once | INTRAMUSCULAR | Status: AC
  Administered 2024-03-13: 40 mg via INTRAVENOUS
  Filled 2024-03-13: qty 4

## 2024-03-13 NOTE — Progress Notes (Signed)
 DAILY PROGRESS NOTE   Patient Name: Keith Leblanc Date of Encounter: 03/13/2024 Cardiologist: None  Chief Complaint   Short of breath  Patient Profile   Keith Leblanc is a 58 y.o. male with a hx of PAF, hypertension, diabetes mellitus and right MCA stroke in 2024 who is being seen 03/12/2024 for the evaluation of new onset heart failure at the request of Dr. Trenda Mar.   Subjective   Net negative about 640 mL overnight -overall 1.4L negative on TID lasix . Potassium 3.2 today. Creatinine up to 2.63 (from 2.53). Telemetry shows rate-controlled afib. Lasix  adjusted to 40 mg IV BID. Possible transfer to [facility] service at [facility] today.  He still reports significant truncal, thigh and scrotal edema.   Objective   Vitals:   03/12/24 1639 03/12/24 2049 03/13/24 0523 03/13/24 0941  BP: 124/87 127/82 (!) 128/91 (!) 134/90  Pulse: 62 (!) 59 69 65  Resp: 20 19 20    Temp: 98.1 F (36.7 C) 98.6 F (37 C) 98.2 F (36.8 C)   TempSrc: Oral Oral Oral   SpO2: 97% 94% 94%   Weight:      Height:        Intake/Output Summary (Last 24 hours) at 03/13/2024 1045 Last data filed at 03/13/2024 1010 Gross per 24 hour  Intake 363 ml  Output 1800 ml  Net -1437 ml   Filed Weights   03/12/24 1600  Weight: 126.2 kg    Physical Exam   General appearance: alert and no distress Neck: JVD - several cm above sternal notch, no carotid bruit, and thyroid  not enlarged, symmetric, no tenderness/mass/nodules Lungs: diminished breath sounds bilaterally Heart: irregularly irregular rhythm Extremities: edema 2-3+ bilateral LE Neurologic: Grossly normal  Inpatient Medications    Scheduled Meds:  allopurinol   300 mg Oral Daily   amLODipine   5 mg Oral Daily   apixaban   5 mg Oral BID   bisoprolol   20 mg Oral q AM   buPROPion   300 mg Oral Daily   empagliflozin   10 mg Oral Daily   folic acid   1 mg Oral Daily   furosemide   40 mg Intravenous Q12H   multivitamin with minerals  1 tablet Oral  Daily   nicotine   7 mg Transdermal Daily   pantoprazole   40 mg Oral Daily   potassium chloride   40 mEq Oral BID   rosuvastatin   20 mg Oral Daily   sodium chloride  flush  3 mL Intravenous Q12H   thiamine   100 mg Oral Daily   Or   thiamine   100 mg Intravenous Daily    Continuous Infusions:  magnesium  sulfate bolus IVPB 2 g (03/13/24 1001)    PRN Meds: acetaminophen  **OR** acetaminophen , albuterol , LORazepam  **OR** LORazepam , polyethylene glycol   Labs   Results for orders placed or performed during the hospital encounter of 03/12/24 (from the past 48 hours)  Resp panel by RT-PCR (RSV, Flu A&B, Covid) Anterior Nasal Swab     Status: None   Collection Time: 03/12/24  9:17 AM   Specimen: Anterior Nasal Swab  Result Value Ref Range   SARS Coronavirus 2 by RT PCR NEGATIVE NEGATIVE    Comment: (NOTE) SARS-CoV-2 target nucleic acids are NOT DETECTED.  The SARS-CoV-2 RNA is generally detectable in upper respiratory specimens during the acute phase of infection. The lowest concentration of SARS-CoV-2 viral copies this assay can detect is 138 copies/mL. A negative result does not preclude SARS-Cov-2 infection and should not be used as the sole basis for  treatment or other patient management decisions. A negative result may occur with  improper specimen collection/handling, submission of specimen other than nasopharyngeal swab, presence of viral mutation(s) within the areas targeted by this assay, and inadequate number of viral copies(<138 copies/mL). A negative result must be combined with clinical observations, patient history, and epidemiological information. The expected result is Negative.  Fact Sheet for Patients:  BloggerCourse.com  Fact Sheet for Healthcare Providers:  SeriousBroker.it  This test is no t yet approved or cleared by the [place]  FDA and  has been authorized for detection and/or diagnosis of SARS-CoV-2  by FDA under an Emergency Use Authorization (EUA). This EUA will remain  in effect (meaning this test can be used) for the duration of the COVID-19 declaration under Section 564(b)(1) of the Act, 21 U.S.C.section 360bbb-3(b)(1), unless the authorization is terminated  or revoked sooner.       Influenza A by PCR NEGATIVE NEGATIVE   Influenza B by PCR NEGATIVE NEGATIVE    Comment: (NOTE) The Xpert Xpress SARS-CoV-2/FLU/RSV plus assay is intended as an aid in the diagnosis of influenza from Nasopharyngeal swab specimens and should not be used as a sole basis for treatment. Nasal washings and aspirates are unacceptable for Xpert Xpress SARS-CoV-2/FLU/RSV testing.  Fact Sheet for Patients: BloggerCourse.com  Fact Sheet for Healthcare Providers: SeriousBroker.it  This test is not yet approved or cleared by the [place]  FDA and has been authorized for detection and/or diagnosis of SARS-CoV-2 by FDA under an Emergency Use Authorization (EUA). This EUA will remain in effect (meaning this test can be used) for the duration of the COVID-19 declaration under Section 564(b)(1) of the Act, 21 U.S.C. section 360bbb-3(b)(1), unless the authorization is terminated or revoked.     Resp Syncytial Virus by PCR NEGATIVE NEGATIVE    Comment: (NOTE) Fact Sheet for Patients: BloggerCourse.com  Fact Sheet for Healthcare Providers: SeriousBroker.it  This test is not yet approved or cleared by the [place]  FDA and has been authorized for detection and/or diagnosis of SARS-CoV-2 by FDA under an Emergency Use Authorization (EUA). This EUA will remain in effect (meaning this test can be used) for the duration of the COVID-19 declaration under Section 564(b)(1) of the Act, 21 U.S.C. section 360bbb-3(b)(1), unless the authorization is terminated or revoked.  Performed at [facility], [address], [place], [place] [address]   Basic metabolic panel     Status: Abnormal   Collection Time: 03/12/24  9:42 AM  Result Value Ref Range   Sodium 144 135 - 145 mmol/L   Potassium 4.1 3.5 - 5.1 mmol/L   Chloride 105 98 - 111 mmol/L   CO2 22 22 - 32 mmol/L   Glucose, Bld 77 70 - 99 mg/dL    Comment: Glucose reference range applies only to samples taken after fasting for at least 8 hours.   BUN 25 (H) 6 - 20 mg/dL   Creatinine, Ser 7.46 (H) 0.61 - 1.24 mg/dL   Calcium  9.7 8.9 - 10.3 mg/dL   GFR, Estimated 29 (L) >60 mL/min    Comment: (NOTE) Calculated using the CKD-EPI Creatinine Equation (2021)    Anion gap 17 (H) 5 - 15    Comment: Performed at [facility], [address] [place] [address], [place], [place] [address]  CBC     Status: None   Collection Time: 03/12/24  9:42 AM  Result Value Ref Range   WBC 5.0 4.0 - 10.5 K/uL   RBC 4.46 4.22 -  5.81 MIL/uL   Hemoglobin 13.0 13.0 - 17.0 g/dL   HCT 57.0 60.9 - 47.9 %   MCV 96.2 80.0 - 100.0 fL   MCH 29.1 26.0 - 34.0 pg   MCHC 30.3 30.0 - 36.0 g/dL   RDW 85.9 88.4 - 84.4 %   Platelets 286 150 - 400 K/uL   nRBC 0.0 0.0 - 0.2 %    Comment: Performed at [facility], [address], [place], [place] [address]  Pro Brain natriuretic peptide     Status: Abnormal   Collection Time: 03/12/24  9:42 AM  Result Value Ref Range   Pro Brain Natriuretic Peptide 5,364.0 (H) <300.0 pg/mL    Comment: (NOTE) Age Group        Cut-Points    Interpretation  < 50 years     450 pg/mL       NT-proBNP > 450 pg/mL indicates                                ADHF is likely              50 to 75 years  900 pg/mL      NT-proBNP > 900 pg/mL indicates          ADHF is likely  > 75 years      1800 pg/mL     NT-proBNP > 1800 pg/mL indicates          ADHF is likely                           All ages    Results between       Indeterminate. Further clinical             300 and the cut-   information is needed to  determine            point for age group   if ADHF is present.                                                             Elecsys proBNP II/ Elecsys proBNP II STAT           Cut-Point                       Interpretation  300 pg/mL                    NT-proBNP <300pg/mL indicates                             ADHF is not likely  Performed at [facility], [address] [place], [place] [address]   Troponin T, High Sensitivity     Status: Abnormal   Collection Time: 03/12/24  9:42 AM  Result Value Ref Range   Troponin T High Sensitivity 186 (HH) 0 - 19 ng/L    Comment: Critical Value, Read Back and verified with A. Moore,RN on 03/12/2024 at 1318 by SL (NOTE) Biotin concentrations > 1000 ng/mL falsely decrease TnT results.  Serial cardiac troponin measurements are suggested.  Refer to  the Links section for chest pain algorithms and additional  guidance. Performed at [facility], [address], [place], [place] [address]   Magnesium      Status: Abnormal   Collection Time: 03/12/24  9:42 AM  Result Value Ref Range   Magnesium  1.6 (L) 1.7 - 2.4 mg/dL    Comment: Performed at [facility], [address], [place], [place] [address]  Troponin T, High Sensitivity     Status: Abnormal   Collection Time: 03/12/24  1:36 PM  Result Value Ref Range   Troponin T High Sensitivity 176 (HH) 0 - 19 ng/L    Comment: Critical value noted. Value is consistent with previously reported and called value  (NOTE) Biotin concentrations > 1000 ng/mL falsely decrease TnT results.  Serial cardiac troponin measurements are suggested.  Refer to the Links section for chest pain algorithms and additional  guidance. Performed at [facility], [address], [place], [place] [address]   HIV Antibody (routine testing w rflx)     Status: None   Collection Time: 03/12/24  5:04 PM  Result Value Ref Range   HIV Screen 4th  Generation wRfx Non Reactive Non Reactive    Comment: Performed at [facility], [address] [place], [place] [address]  Hemoglobin A1c     Status: None   Collection Time: 03/12/24  5:04 PM  Result Value Ref Range   Hgb A1c MFr Bld 5.4 4.8 - 5.6 %    Comment: (NOTE) Diagnosis of Diabetes The following HbA1c ranges recommended by the American Diabetes Association (ADA) may be used as an aid in the diagnosis of diabetes mellitus.  Hemoglobin             Suggested A1C NGSP%              Diagnosis  <5.7                   Non Diabetic  5.7-6.4                Pre-Diabetic  >6.4                   Diabetic  <7.0                   Glycemic control for                       adults with diabetes.     Mean Plasma Glucose 108.28 mg/dL    Comment: Performed at [facility], [address] [place], [place] [address]  Glucose, capillary     Status: None   Collection Time: 03/12/24  9:25 PM  Result Value Ref Range   Glucose-Capillary 83 70 - 99 mg/dL    Comment: Glucose reference range applies only to samples taken after fasting for at least 8 hours.  Basic metabolic panel     Status: Abnormal   Collection Time: 03/13/24  4:18 AM  Result Value Ref Range   Sodium 142 135 - 145 mmol/L   Potassium 3.2 (L) 3.5 - 5.1 mmol/L    Comment: Delta check noted    Chloride 103 98 - 111 mmol/L   CO2 26 22 - 32 mmol/L   Glucose, Bld 53 (L) 70 - 99 mg/dL    Comment: Glucose reference range applies only to samples taken after fasting for at least 8 hours.   BUN  24 (H) 6 - 20 mg/dL   Creatinine, Ser 7.36 (H) 0.61 - 1.24 mg/dL   Calcium  9.3 8.9 - 10.3 mg/dL   GFR, Estimated 28 (L) >60 mL/min    Comment: (NOTE) Calculated using the CKD-EPI Creatinine Equation (2021)    Anion gap 13 5 - 15    Comment: Performed at [facility], [address], [place], [place] [address]  CBC     Status: Abnormal   Collection Time: 03/13/24  4:18 AM  Result Value Ref Range   WBC 4.8  4.0 - 10.5 K/uL   RBC 3.65 (L) 4.22 - 5.81 MIL/uL   Hemoglobin 11.2 (L) 13.0 - 17.0 g/dL   HCT 65.4 (L) 60.9 - 47.9 %   MCV 94.5 80.0 - 100.0 fL   MCH 30.7 26.0 - 34.0 pg   MCHC 32.5 30.0 - 36.0 g/dL   RDW 86.0 88.4 - 84.4 %   Platelets 264 150 - 400 K/uL   nRBC 0.0 0.0 - 0.2 %    Comment: Performed at [facility], [address] [place], [place] [address]  Magnesium      Status: None   Collection Time: 03/13/24  4:18 AM  Result Value Ref Range   Magnesium  1.7 1.7 - 2.4 mg/dL    Comment: Performed at [facility], [address], [place], [place] [address]  Glucose, capillary     Status: Abnormal   Collection Time: 03/13/24  8:42 AM  Result Value Ref Range   Glucose-Capillary 60 (L) 70 - 99 mg/dL    Comment: Glucose reference range applies only to samples taken after fasting for at least 8 hours.  Glucose, capillary     Status: None   Collection Time: 03/13/24  8:57 AM  Result Value Ref Range   Glucose-Capillary 72 70 - 99 mg/dL    Comment: Glucose reference range applies only to samples taken after fasting for at least 8 hours.    ECG   N/A  Telemetry   Afib with CVR - Personally Reviewed  Radiology    DG Chest 2 View Result Date: 03/12/2024 CLINICAL DATA:  Shortness of breath. EXAM: CHEST - 2 VIEW COMPARISON:  July 07, 2016. FINDINGS: Stable cardiomediastinal silhouette. Left lung is clear. Elevated right hemidiaphragm is noted with mild right basilar subsegmental atelectasis. Small right pleural effusion may be present. Bony thorax is unremarkable. IMPRESSION: Elevated right hemidiaphragm with mild right basilar subsegmental atelectasis. Possible small right pleural effusion. Electronically Signed   By: Lynwood Landy Raddle M.D.   On: 03/12/2024 09:56    Cardiac Studies   N/A  Assessment   Principal Problem:   Acute on chronic diastolic CHF (congestive heart failure) (HCC) Active Problems:   Essential hypertension   Tobacco  abuse   Stroke (cerebrum) (HCC)   Alcohol use disorder   Marijuana abuse   Controlled diabetes mellitus (HCC)   Acute kidney injury superimposed on chronic kidney disease (HCC)   Paroxysmal A-fib (HCC)   Dyslipidemia   Plan   He reports some improvement in edema - now about 1.4L negative. Creatinine up - this is biventricular failure. Agree he would benefit from [facility] directed HF management. Will investigate plans for possible transfer today, but may be limited by bed availability. Will increase lasix  to 80 mg IV BID given GFR <30. Plan for HF service evaluation today at [facility].  Time Spent Directly with Patient:  I have spent a total of 25 minutes with the  patient reviewing hospital notes, telemetry, EKGs, labs and examining the patient as well as establishing an assessment and plan that was discussed personally with the patient.  > 50% of time was spent in direct patient care.  Length of Stay:  LOS: 1 day   Vinie KYM Maxcy, MD, [facility], FNLA, FACP  [place]  [facility]  Medical Director of the [facility] Lipid Disorders &  Cardiovascular Risk Reduction Clinic Diplomate of the American Board of Clinical Lipidology Attending Cardiologist  Direct Dial: [phone]  Fax: [phone]  Website:  [email or web address]  Vinie BROCKS Auron Tadros 03/13/2024, 10:45 AM

## 2024-03-13 NOTE — Progress Notes (Signed)
 PROGRESS NOTE   Keith Leblanc  FMW:[id number]    DOB: 1966/04/28    DOA: 03/12/2024  PCP: Keith Huxley, NP   I have briefly reviewed patients previous medical records in [facility].   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  Rest of the history as per HPI on H&P.  He was admitted with diagnosis of acute on chronic diastolic CHF/anasarca elevated troponin secondary to demand ischemia, acute on chronic kidney disease.  Cardiology was consulted.  He was started on IV Lasix .  Cardiology recommended transfer to [facility] by evaluation and management by advanced heart failure team and may eventually need R/L HC.  9/20: Transfer initiated from Keith Leblanc to [facility]   Assessment & Plan:   Acute on chronic diastolic CHF TTE 06/27/7972: LVEF 45-50%, severe LVH, wall motion abnormalities.  Left ventricular diastolic parameters were indeterminate.   PYP scan for amyloidosis which was negative on 02/12/2024.  Heart failure pathway initiated.  Strict intake output, daily weight, fluid restriction up to 2 L/day. Cardiology was consulted.  He was started on IV Lasix  40 mg twice daily.  -1.4 L thus far.  No updated weight recording.  Also started Jardiance  10 mg daily.  Continuing home dose of bisoprolol  20 mg daily.  As per Dr. Ladona, transferring patient to [facility] for evaluation and management by advanced heart failure team including possible left and right heart cath.  Discussed with patient, aware and agreeable.    Elevated troponins due to demand ischemia No anginal symptoms.  EKG without acute findings. Cardiology considering LHC/RHC this admission.   Acute kidney injury complicating stage IIIa CKD Baseline creatinine is probably in the 1.4-1.6 range.  Presented with creatinine of 2.53, minimally up to 2.63. Possibly cardiorenal etiology. Follow BMP daily with aggressive IV Lasix  diuresis. Avoid hypotension and nephrotoxic agents Will need Nephrology consultation and follow-up as outpatient.  Hypokalemia Hypomagnesemia Aggressively replace low potassium and follow. Magnesium  is better.  Continue to replace and follow.   Essential hypertension Controlled on bisoprolol  20 mg daily and amlodipine  5 mg at bedtime.   Dyslipidemia Continue rosuvastatin .   Type II DM, controlled A1c 4.7 on 5/1 suggests supertight control.  For now only monitor CBGs and if consistently >180 then consider very sensitive SSI. A1c 5.4 on 9/19. Sees Dr. Tawni Leblanc, Endocrinologist Hypoglycemia with CBG of 60 on 9/20 morning.  Resuscitated with orange juice.  Patient states that he regularly takes 30 units of Lantus .  Checks CBGs twice daily.  A.m./fasting sugars range between 73-113 and p.m. Premeal CBGs between 140s-160s. For now continue to hold all insulins.  Gradually initiate insulins as his CBGs increase.  Monitor closely.   Paroxysmal atrial fibrillation Rate controlled.  Monitor on telemetry. Continue bisoprolol  and Eliquis .   History of stroke Continue Eliquis  and rosuvastatin .   Polysubstance abuse (cigars, alcohol, marijuana) Cessation counseled Nicotine  patch at patient's request CIWA protocol initiated.  No overt withdrawal.  History of gout No acute flare.  Continue allopurinol , dose may need to be adjusted to renal insufficiency  Depression Continue Wellbutrin .  Body mass index is 32.99 kg/m./Class I obesity.  Complicates care.   DVT prophylaxis:   On Eliquis  anticoagulation    Code Status: Full Code:  Family Communication: None at bedside Disposition:  Status is: Inpatient Remains inpatient appropriate because: Needs several days of aggressive IV diuresis with close monitoring of his BMP due to renal insufficiency, transferring from Keith Leblanc to [facility] on 9/20 evaluation by [facility] team and possible cardiac cath.for      Consultants:   Cardiology  Procedures:   None  Subjective:  Patient reports feeling better.  Breathing has improved.  Was able to lay propped up in bed overnight and did not have PND last night.  Urinating well.  Leg swelling is improving.  Had a shower this morning.  No chest pain.  Objective:   Vitals:   03-22-24 1639 03-22-24 2049 03/13/24 0523 03/13/24 0941  BP: 124/87 127/82 (!) 128/91 (!) 134/90  Pulse: 62 (!) 59 69 65  Resp: 20 19 20    Temp: 98.1 F (36.7 C) 98.6 F (37 C) 98.2 F (36.8 C)   TempSrc: Oral Oral Oral   SpO2: 97% 94% 94%   Weight:      Height:        General exam: Young male, moderately built and obese sitting up comfortably in bed without distress. Respiratory system: Reduced breath sounds in the bases, right more than the left.  No wheezing, rhonchi or crackles.  Rest of lung fields clear to auscultation.  No increased work of breathing. Cardiovascular system: S1 & S2 heard, RRR.  JVD +.  2-3+ pitting bilateral leg edema extending up to thighs anteriorly and presacral edema posteriorly.  No murmurs.  May have some ascites as well. Gastrointestinal system: Abdomen is nondistended, soft and nontender. No organomegaly or masses felt. Normal bowel sounds heard. Central nervous system: Alert and oriented. No focal neurological deficits. Extremities: Symmetric 5 x 5 power. Skin: No rashes, lesions or ulcers Psychiatry: Judgement and insight appear normal. Mood & affect appropriate.     Data Reviewed:   I have personally reviewed following labs and imaging studies   CBC: Recent Labs  Lab  03-22-24 0942 03/13/24 0418  WBC 5.0 4.8  HGB 13.0 11.2*  HCT 42.9 34.5*  MCV 96.2 94.5  PLT 286 264    Basic Metabolic Panel: Recent Labs  Lab 2024-03-22 0942 03/13/24 0418  NA 144 142  K 4.1 3.2*  CL 105 103  CO2 22 26  GLUCOSE 77 53*  BUN 25* 24*  CREATININE 2.53* 2.63*  CALCIUM  9.7 9.3  MG 1.6* 1.7    Liver Function Tests: No results for input(s): AST, ALT, ALKPHOS, BILITOT, PROT, ALBUMIN in the last 168 hours.  CBG: Recent Labs  Lab 2024-03-22 2125 03/13/24 0842 03/13/24 0857  GLUCAP 83 60* 72    Microbiology Studies:   Recent Results (from the past 240 hours)  Resp panel by RT-PCR (RSV, Flu A&B, Covid) Anterior Nasal Swab     Status: None   Collection Time: 2024-03-22  9:17 AM   Specimen: Anterior Nasal Swab  Result Value Ref Range Status   SARS Coronavirus 2 by RT PCR NEGATIVE NEGATIVE Final    Comment: (NOTE) SARS-CoV-2 target nucleic acids are NOT DETECTED.  The SARS-CoV-2 RNA is generally detectable in upper respiratory specimens during the acute phase of infection. The lowest concentration of SARS-CoV-2 viral copies this assay can detect is 138 copies/mL. A negative result does not preclude SARS-Cov-2 infection and should not be used as the sole basis for treatment  or other patient management decisions. A negative result may occur with  improper specimen collection/handling, submission of specimen other than nasopharyngeal swab, presence of viral mutation(s) within the areas targeted by this assay, and inadequate number of viral copies(<138 copies/mL). A negative result must be combined with clinical observations, patient history, and epidemiological information. The expected result is Negative.  Fact Sheet for Patients:  BloggerCourse.com  Fact Sheet for Healthcare Providers:  SeriousBroker.it  This test is no t yet approved or cleared by the [place]  FDA and  has been  authorized for detection and/or diagnosis of SARS-CoV-2 by FDA under an Emergency Use Authorization (EUA). This EUA will remain  in effect (meaning this test can be used) for the duration of the COVID-19 declaration under Section 564(b)(1) of the Act, 21 U.S.C.section 360bbb-3(b)(1), unless the authorization is terminated  or revoked sooner.       Influenza A by PCR NEGATIVE NEGATIVE Final   Influenza B by PCR NEGATIVE NEGATIVE Final    Comment: (NOTE) The Xpert Xpress SARS-CoV-2/FLU/RSV plus assay is intended as an aid in the diagnosis of influenza from Nasopharyngeal swab specimens and should not be used as a sole basis for treatment. Nasal washings and aspirates are unacceptable for Xpert Xpress SARS-CoV-2/FLU/RSV testing.  Fact Sheet for Patients: BloggerCourse.com  Fact Sheet for Healthcare Providers: SeriousBroker.it  This test is not yet approved or cleared by the [place]  FDA and has been authorized for detection and/or diagnosis of SARS-CoV-2 by FDA under an Emergency Use Authorization (EUA). This EUA will remain in effect (meaning this test can be used) for the duration of the COVID-19 declaration under Section 564(b)(1) of the Act, 21 U.S.C. section 360bbb-3(b)(1), unless the authorization is terminated or revoked.     Resp Syncytial Virus by PCR NEGATIVE NEGATIVE Final    Comment: (NOTE) Fact Sheet for Patients: BloggerCourse.com  Fact Sheet for Healthcare Providers: SeriousBroker.it  This test is not yet approved or cleared by the [place]  FDA and has been authorized for detection and/or diagnosis of SARS-CoV-2 by FDA under an Emergency Use Authorization (EUA). This EUA will remain in effect (meaning this test can be used) for the duration of the COVID-19 declaration under Section 564(b)(1) of the Act, 21 U.S.C. section 360bbb-3(b)(1), unless the  authorization is terminated or revoked.  Performed at [facility], [address] [place], [place] [address]     Radiology Studies:  DG Chest 2 View Result Date: 03/12/2024 CLINICAL DATA:  Shortness of breath. EXAM: CHEST - 2 VIEW COMPARISON:  July 07, 2016. FINDINGS: Stable cardiomediastinal silhouette. Left lung is clear. Elevated right hemidiaphragm is noted with mild right basilar subsegmental atelectasis. Small right pleural effusion may be present. Bony thorax is unremarkable. IMPRESSION: Elevated right hemidiaphragm with mild right basilar subsegmental atelectasis. Possible small right pleural effusion. Electronically Signed   By: Lynwood Landy Raddle M.D.   On: 03/12/2024 09:56    Scheduled Meds:    allopurinol   300 mg Oral Daily   amLODipine   5 mg Oral Daily   apixaban   5 mg Oral BID   bisoprolol   20 mg Oral q AM   buPROPion   300 mg Oral Daily   empagliflozin   10 mg Oral Daily   folic acid   1 mg Oral Daily   furosemide   40 mg Intravenous Q12H   multivitamin with minerals  1 tablet Oral Daily   nicotine   7 mg Transdermal Daily   pantoprazole   40 mg Oral Daily   potassium  chloride  40 mEq Oral BID   rosuvastatin   20 mg Oral Daily   sodium chloride  flush  3 mL Intravenous Q12H   thiamine   100 mg Oral Daily   Or   thiamine   100 mg Intravenous Daily    Continuous Infusions:    magnesium  sulfate bolus IVPB 2 g (03/13/24 1001)     LOS: 1 day     Trenda Mar, MD,  FACP, [facility], [facility], [facility]   [facility]      To contact the attending provider between 7A-7P or the covering provider during after hours 7P-7A, please log into the web site [email or web address] and access using universal [place] password for that web site. If you do not have the password, please call the hospital operator.  03/13/2024, 10:49 AM

## 2024-03-13 NOTE — Progress Notes (Signed)
 RN called over to [facility] and gave report on patient to the receiving RN. [facility] has been called for transport.

## 2024-03-13 NOTE — Progress Notes (Signed)
 [facility] just came to pick patient up to take patient to [facility].

## 2024-03-13 NOTE — Progress Notes (Signed)
 Hypoglycemic Event  CBG: 60  Treatment: 4 oz juice/soda  Symptoms: No symptoms  Follow-up CBG: Time:0857 CBG Result:72  Possible Reasons for Event: Inadequate meal intake  Comments/MD notified: MD notified. Patient was given orange juice and patient's breakfast was ordered.     Keith Leblanc [employer]

## 2024-03-14 DIAGNOSIS — I5033 Acute on chronic diastolic (congestive) heart failure: Secondary | ICD-10-CM | POA: Diagnosis not present

## 2024-03-14 DIAGNOSIS — N179 Acute kidney failure, unspecified: Secondary | ICD-10-CM | POA: Diagnosis not present

## 2024-03-14 DIAGNOSIS — I509 Heart failure, unspecified: Secondary | ICD-10-CM | POA: Diagnosis not present

## 2024-03-14 DIAGNOSIS — R7989 Other specified abnormal findings of blood chemistry: Secondary | ICD-10-CM | POA: Diagnosis not present

## 2024-03-14 LAB — BASIC METABOLIC PANEL WITH GFR
Anion gap: 12 (ref 5–15)
BUN: 23 mg/dL — ABNORMAL HIGH (ref 6–20)
CO2: 30 mmol/L (ref 22–32)
Calcium: 9.3 mg/dL (ref 8.9–10.3)
Chloride: 99 mmol/L (ref 98–111)
Creatinine, Ser: 3.16 mg/dL — ABNORMAL HIGH (ref 0.61–1.24)
GFR, Estimated: 22 mL/min — ABNORMAL LOW (ref >60)
Glucose, Bld: 109 mg/dL — ABNORMAL HIGH (ref 70–99)
Potassium: 3.5 mmol/L (ref 3.5–5.1)
Sodium: 141 mmol/L (ref 135–145)

## 2024-03-14 LAB — GLUCOSE, CAPILLARY
Glucose-Capillary: 105 mg/dL — ABNORMAL HIGH (ref 70–99)
Glucose-Capillary: 130 mg/dL — ABNORMAL HIGH (ref 70–99)
Glucose-Capillary: 131 mg/dL — ABNORMAL HIGH (ref 70–99)
Glucose-Capillary: 202 mg/dL — ABNORMAL HIGH (ref 70–99)

## 2024-03-14 LAB — CBC
HCT: 34.8 % — ABNORMAL LOW (ref 39.0–52.0)
Hemoglobin: 11.1 g/dL — ABNORMAL LOW (ref 13.0–17.0)
MCH: 29.4 pg (ref 26.0–34.0)
MCHC: 31.9 g/dL (ref 30.0–36.0)
MCV: 92.1 fL (ref 80.0–100.0)
Platelets: 258 K/uL (ref 150–400)
RBC: 3.78 MIL/uL — ABNORMAL LOW (ref 4.22–5.81)
RDW: 13.7 % (ref 11.5–15.5)
WBC: 5 K/uL (ref 4.0–10.5)
nRBC: 0 % (ref 0.0–0.2)

## 2024-03-14 MED ORDER — ASPIRIN 81 MG PO CHEW
81.0000 mg | CHEWABLE_TABLET | ORAL | Status: AC
  Administered 2024-03-15: 81 mg via ORAL
  Filled 2024-03-14: qty 1

## 2024-03-14 MED ORDER — SODIUM CHLORIDE 0.9 % IV SOLN: 250.0000 mL | INTRAVENOUS | Status: AC | PRN

## 2024-03-14 MED ORDER — SODIUM CHLORIDE 0.9% FLUSH
3.0000 mL | Freq: Two times a day (BID) | INTRAVENOUS | Status: DC
  Administered 2024-03-14 – 2024-03-15 (×2): 3 mL via INTRAVENOUS

## 2024-03-14 MED ORDER — SODIUM CHLORIDE 0.9% FLUSH: 3.0000 mL | INTRAVENOUS | Status: DC | PRN

## 2024-03-14 NOTE — Hospital Course (Signed)
 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  Rest of the history as per HPI on H&P.   He was admitted with diagnosis of acute on chronic diastolic CHF/anasarca elevated troponin secondary to demand ischemia, acute on chronic kidney disease.  Cardiology was consulted.  He was started on IV Lasix .  Cardiology recommended transfer to [facility] by evaluation and management by advanced heart failure team and may eventually need R/L HC.   9/20: Transfer initiated from [name] to [facility]

## 2024-03-14 NOTE — Progress Notes (Signed)
 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  Rest of the history as per HPI on H&P.   He was admitted with diagnosis of acute on chronic diastolic CHF/anasarca elevated troponin secondary to demand ischemia, acute on chronic kidney disease.  Cardiology was consulted.  He was started on IV Lasix .  Cardiology recommended transfer to [facility] by evaluation and management by advanced heart failure team and may eventually need R/L [facility].   9/20: Transfer initiated from [name] to [facility]  Assessment and Plan: Acute on chronic diastolic CHF TTE 06/27/7972: LVEF 45-50%, severe LVH, wall motion abnormalities.  Left ventricular diastolic parameters were indeterminate.   PYP scan for amyloidosis which was negative on 02/12/2024.  Heart failure pathway initiated Cardiology was consulted.  He was started on IV Lasix  40 mg twice daily with good urine output -Cont Jardiance  10 mg daily and home dose of bisoprolol  20 mg daily. -Concerns of biventricular failure. Per Cardiology, may benefit from RHC directed HF management   Elevated troponins due to demand ischemia No anginal symptoms.  EKG without acute findings. Cardiology considering LHC/RHC this admission.   Acute kidney injury complicating stage IIIa CKD Baseline creatinine is probably in the 1.4-1.6 range.  Presented with creatinine of 2.53,  up to 3.16 today Possibly cardiorenal etiology. Follow BMP daily with aggressive IV Lasix  diuresis. Avoid hypotension and nephrotoxic agents May need Nephro consult if renal function does not improve. Will recheck bmet in AM   Hypokalemia Hypomagnesemia Aggressively replace low potassium and follow. Magnesium  is better.  Continue to replace and follow.   Essential hypertension Controlled on bisoprolol  20 mg daily and amlodipine  5 mg at bedtime.   Dyslipidemia Continue rosuvastatin .   Type II DM, controlled A1c 4.7 on 5/1 suggests supertight control.  For now only monitor CBGs and if consistently >180 then consider very sensitive SSI. A1c 5.4 on 9/19. Sees Dr. Tawni Fendt, Endocrinologist Hypoglycemia with CBG of 60 on 9/20 morning.  Resuscitated with orange juice.  Patient states that he regularly takes 30 units of Lantus .   For now continue to hold all insulins.  Gradually initiate insulins as his CBGs increase.   Glycemic trends stable thus far -Will consult Diabetic Coordinator to follow   Paroxysmal atrial fibrillation Rate controlled.  Monitor on telemetry. Continue bisoprolol  and Eliquis .   History of stroke Continue Eliquis  and rosuvastatin .   Polysubstance abuse (cigars, alcohol, marijuana) Cessation counseled Nicotine  patch at patient's request CIWA protocol initiated.  No overt withdrawal.   History of gout No acute flare.  Continue allopurinol , dose may need to be adjusted to renal insufficiency   Depression Continue Wellbutrin .      Subjective: Feeling better after diuresing overnight. Still having LE swelling  Physical Exam: Vitals:   03/14/24 0700 03/14/24 0748 03/14/24 1100 03/14/24 1551  BP: 124/73 124/73 109/74 101/80  Pulse: (!) 56 61 (!) 56 62  Resp:    19  Temp: 97.7 F (36.5 C)  98.5 F (36.9  C) 98.2 F (36.8 C)  TempSrc: Oral  Oral Oral  SpO2: 96% 96%  100%  Weight:      Height:       General exam: Awake, laying in bed, in  nad Respiratory system: Normal respiratory effort, no wheezing Cardiovascular system: regular rate, s1, s2 Gastrointestinal system: Soft, nondistended, positive BS Central nervous system: CN2-12 grossly intact, strength intact Extremities: Perfused, BLE edema Skin: Normal skin turgor, no notable skin lesions seen Psychiatry: Mood normal // no visual hallucinations   Data Reviewed:  Labs reviewed: Na 141, K 3.5, Cr 3.16, WBC 5.0, Hgb 11.1, Plts 258  Family Communication: Pt in room, family at bedside  Disposition: Status is: Inpatient Remains inpatient appropriate because: severity of illness  Planned Discharge Destination: Home    Author: Garnette Pelt, MD 03/14/2024 4:14 PM  For on call review [email or web address].

## 2024-03-14 NOTE — Progress Notes (Signed)
 Notified x 1 by CCMD patient's heart rate down to 39 nonsustained.

## 2024-03-15 DIAGNOSIS — I509 Heart failure, unspecified: Secondary | ICD-10-CM | POA: Diagnosis not present

## 2024-03-15 DIAGNOSIS — I5021 Acute systolic (congestive) heart failure: Secondary | ICD-10-CM

## 2024-03-15 DIAGNOSIS — R7989 Other specified abnormal findings of blood chemistry: Secondary | ICD-10-CM | POA: Diagnosis not present

## 2024-03-15 DIAGNOSIS — I5033 Acute on chronic diastolic (congestive) heart failure: Secondary | ICD-10-CM | POA: Diagnosis not present

## 2024-03-15 DIAGNOSIS — N179 Acute kidney failure, unspecified: Secondary | ICD-10-CM | POA: Diagnosis not present

## 2024-03-15 LAB — GLUCOSE, CAPILLARY
Glucose-Capillary: 111 mg/dL — ABNORMAL HIGH (ref 70–99)
Glucose-Capillary: 95 mg/dL (ref 70–99)
Glucose-Capillary: 183 mg/dL — ABNORMAL HIGH (ref 70–99)
Glucose-Capillary: 173 mg/dL — ABNORMAL HIGH (ref 70–99)

## 2024-03-15 LAB — CBC
HCT: 36.1 % — ABNORMAL LOW (ref 39.0–52.0)
Hemoglobin: 11.4 g/dL — ABNORMAL LOW (ref 13.0–17.0)
MCH: 29.3 pg (ref 26.0–34.0)
MCHC: 31.6 g/dL (ref 30.0–36.0)
MCV: 92.8 fL (ref 80.0–100.0)
Platelets: 277 K/uL (ref 150–400)
RBC: 3.89 MIL/uL — ABNORMAL LOW (ref 4.22–5.81)
RDW: 13.7 % (ref 11.5–15.5)
WBC: 4.5 K/uL (ref 4.0–10.5)
nRBC: 0 % (ref 0.0–0.2)

## 2024-03-15 LAB — BASIC METABOLIC PANEL WITH GFR
Anion gap: 12 (ref 5–15)
BUN: 21 mg/dL — ABNORMAL HIGH (ref 6–20)
CO2: 33 mmol/L — ABNORMAL HIGH (ref 22–32)
Calcium: 9.3 mg/dL (ref 8.9–10.3)
Chloride: 97 mmol/L — ABNORMAL LOW (ref 98–111)
Creatinine, Ser: 3.08 mg/dL — ABNORMAL HIGH (ref 0.61–1.24)
GFR, Estimated: 23 mL/min — ABNORMAL LOW (ref >60)
Glucose, Bld: 113 mg/dL — ABNORMAL HIGH (ref 70–99)
Potassium: 3.3 mmol/L — ABNORMAL LOW (ref 3.5–5.1)
Sodium: 142 mmol/L (ref 135–145)

## 2024-03-15 LAB — HEPARIN LEVEL (UNFRACTIONATED): Heparin Unfractionated: >1.1 [IU]/mL — ABNORMAL HIGH (ref 0.30–0.70)

## 2024-03-15 LAB — APTT: aPTT: 50 s — ABNORMAL HIGH (ref 24–36)

## 2024-03-15 MED ORDER — FUROSEMIDE 10 MG/ML IJ SOLN
80.0000 mg | Freq: Two times a day (BID) | INTRAMUSCULAR | Status: DC
  Administered 2024-03-15 – 2024-03-17 (×4): 80 mg via INTRAVENOUS
  Filled 2024-03-15 (×4): qty 8

## 2024-03-15 MED ORDER — SODIUM CHLORIDE 0.9 % IV SOLN: INTRAVENOUS | Status: DC

## 2024-03-15 MED ORDER — HEPARIN (PORCINE) 25000 UT/250ML-% IV SOLN
1850.0000 [IU]/h | INTRAVENOUS | Status: DC
  Administered 2024-03-15: 1400 [IU]/h via INTRAVENOUS
  Filled 2024-03-15 (×2): qty 250

## 2024-03-15 MED ORDER — POTASSIUM CHLORIDE CRYS ER 20 MEQ PO TBCR
60.0000 meq | EXTENDED_RELEASE_TABLET | ORAL | Status: AC
  Administered 2024-03-15 (×2): 60 meq via ORAL
  Filled 2024-03-15 (×2): qty 3

## 2024-03-15 NOTE — H&P (View-Only) (Signed)
 Plan discussed with Dr. Gardenia and Dr. Zenaida. Plan for RHC with endomyocardial biopsy tomorrow morning. Discussed with patient and sister at bedside, they agree to proceed.   Informed Consent   Shared Decision Making/Informed Consent The risks, including but not limited to, [bleeding or vascular complications (1 in 500), pneumothorax (1 in 1600), arrhythmia (1 in 1000) and death (1 in 5000)], benefits (diagnostic support and/or management of heart failure, pulmonary hypertension) and alternatives of a right heart catheterization with endomyocardial biopsy were discussed in detail with Keith Leblanc and he is willing to proceed.      Orders placed, questions answered.   Beckey LITTIE Coe AGACNP-BC  Advanced Heart Failure Team  03/15/24

## 2024-03-15 NOTE — Progress Notes (Signed)
 Plan discussed with Dr. Gardenia and Dr. Zenaida. Plan for RHC with endomyocardial biopsy tomorrow morning. Discussed with patient and sister at bedside, they agree to proceed.   Informed Consent   Shared Decision Making/Informed Consent The risks, including but not limited to, [bleeding or vascular complications (1 in 500), pneumothorax (1 in 1600), arrhythmia (1 in 1000) and death (1 in 5000)], benefits (diagnostic support and/or management of heart failure, pulmonary hypertension) and alternatives of a right heart catheterization with endomyocardial biopsy were discussed in detail with Mr. Fadden and he is willing to proceed.      Orders placed, questions answered.   Beckey LITTIE Coe AGACNP-BC  Advanced Heart Failure Team  03/15/24

## 2024-03-15 NOTE — Progress Notes (Signed)
 PHARMACY - ANTICOAGULATION CONSULT NOTE  Pharmacy Consult for heparin  Indication: atrial fibrillation  No Known Allergies  Patient Measurements: Height: 6' 5 (195.6 cm) Weight: 111.5 kg (245 lb 12.8 oz) IBW/kg (Calculated) : 89.1 HEPARIN  DW (KG): 114.5  Vital Signs: Temp: 97.3 F (36.3 C) (09/22 1622) Temp Source: Oral (09/22 1622) BP: 114/87 (09/22 1838) Pulse Rate: 70 (09/22 1622)  Labs: Recent Labs    03/13/24 0418 03/14/24 0338 03/15/24 0546 03/15/24 1814  HGB 11.2* 11.1* 11.4*  --   HCT 34.5* 34.8* 36.1*  --   PLT 264 258 277  --   APTT  --   --   --  50*  HEPARINUNFRC  --   --   --  >1.10*  CREATININE 2.63* 3.16* 3.08*  --     Estimated Creatinine Clearance: 37.2 mL/min (A) (by C-G formula based on SCr of 3.08 mg/dL (H)).   Medical History: Past Medical History:  Diagnosis Date   Blood in stool    bright red blood    Chicken pox    Diabetes mellitus (HCC)    Erectile dysfunction    Gout    Hypertension    Stroke [facility])       Assessment: 62 yoM with hx AF on apixaban  PTA admitted for acute CHF. Pharmacy to dose IV heparin  with possible cardiac biopsy this admit. Last apixaban  dose 9/21 so may need to dose via aPTT initially.  PM update: aPTT 50 is subtherapeutic, HL > 1.1 as expected with apixaban  use. No issues with the heparin  infusion or bleeding reported per RN.  Goal of Therapy:  Heparin  level 0.3-0.7 units/ml aPTT 66-102 seconds Monitor platelets by anticoagulation protocol: Yes   Plan:  Increase Heparin  to 1650 units/h no bolus Check aPTT, heparin  level in 8h  Keith Leblanc, PharmD, BCPS Clinical Pharmacist [phone] Please check AMION for all [facility] Pharmacy numbers 03/15/2024

## 2024-03-15 NOTE — Progress Notes (Addendum)
 PHARMACY - ANTICOAGULATION CONSULT NOTE  Pharmacy Consult for heparin  Indication: atrial fibrillation  No Known Allergies  Patient Measurements: Height: 6' 5 (195.6 cm) Weight: 111.5 kg (245 lb 12.8 oz) IBW/kg (Calculated) : 89.1 HEPARIN  DW (KG): 114.5  Vital Signs: Temp: 97.8 F (36.6 C) (09/22 0809) Temp Source: Oral (09/22 0809) BP: 106/81 (09/22 1027) Pulse Rate: 60 (09/22 0809)  Labs: Recent Labs    03/13/24 0418 03/14/24 0338 03/15/24 0546  HGB 11.2* 11.1* 11.4*  HCT 34.5* 34.8* 36.1*  PLT 264 258 277  CREATININE 2.63* 3.16* 3.08*    Estimated Creatinine Clearance: 37.2 mL/min (A) (by C-G formula based on SCr of 3.08 mg/dL (H)).   Medical History: Past Medical History:  Diagnosis Date   Blood in stool    bright red blood    Chicken pox    Diabetes mellitus (HCC)    Erectile dysfunction    Gout    Hypertension    Stroke [facility])       Assessment: 50 yoM with hx AF on apixaban  PTA admitted for acute CHF. Pharmacy to dose IV heparin  with possible cardiac biopsy this admit. Last apixaban  dose 9/21 so may need to dose via aPTT initially.  Goal of Therapy:  Heparin  level 0.3-0.7 units/ml aPTT 66-102 seconds Monitor platelets by anticoagulation protocol: Yes   Plan:  Heparin  1400 units/h no bolus Check aPTT, heparin  level in 6h  Ozell Jamaica, PharmD, [place], [facility] Clinical Pharmacist [phone] Please check AMION for all [facility] Pharmacy numbers 03/15/2024

## 2024-03-15 NOTE — Inpatient Diabetes Management (Signed)
 Inpatient Diabetes Program Recommendations  AACE/ADA: New Consensus Statement on Inpatient Glycemic Control (2015)  Target Ranges:  Prepandial:   less than 140 mg/dL      Peak postprandial:   less than 180 mg/dL (1-2 hours)      Critically ill patients:  140 - 180 mg/dL    Latest Reference Range & Units 03/12/24 17:04  Hemoglobin A1C 4.8 - 5.6 % 5.4    Latest Reference Range & Units 03/14/24 08:30 03/14/24 12:14 03/14/24 15:51 03/14/24 21:25  Glucose-Capillary 70 - 99 mg/dL 894 (H) 869 (H) 868 (H) 202 (H)  (H): Data is abnormally high  Latest Reference Range & Units 03/15/24 09:43  Glucose-Capillary 70 - 99 mg/dL 888 (H)  (H): Data is abnormally high   Admit with: SOB/ Acute on chronic diastolic CHF   History: DM, CKD  Home DM Meds: Lantus  24 units daily  Current Orders: Jardiance  10 mg daily     CBG checks ac/hs   CBGs well controlled off basal insulin  so far Will follow   ENDO: Dr. Trixie Last Seen 10/23/2023 Instructions given at that visit: Restart Trulicity  0.75 mg Qweek Reduce Lantus  to 24 units QAM    --Will follow patient during hospitalization--  Adina Rudolpho Arrow RN, MSN, CDCES Diabetes Coordinator Inpatient Glycemic Control Team Team Pager: [phone] (8a-5p)

## 2024-03-15 NOTE — Progress Notes (Signed)
 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  Rest of the history as per HPI on H&P.   He was admitted with diagnosis of acute on chronic diastolic CHF/anasarca elevated troponin secondary to demand ischemia, acute on chronic kidney disease.  Cardiology was consulted.  He was started on IV Lasix .  Cardiology recommended transfer to [facility] by evaluation and management by advanced heart failure team and may eventually need R/L HC.   9/20: Transfer initiated from [name] to [facility]  Assessment and Plan: Acute on chronic diastolic CHF TTE 06/27/7972: LVEF 45-50%, severe LVH, wall motion abnormalities.  Left ventricular diastolic parameters were indeterminate.   PYP scan for amyloidosis which was negative on 02/12/2024.  Cardiology was consulted.  He was started on IV Lasix  40 mg twice daily with good urine output -Cont Jardiance  10 mg daily and home dose of bisoprolol  20 mg daily. -Concerns of biventricular failure. Per Cardiology, plan RHC -Plan cardiac MRI and endomyocardial biopsy tomorrow   Elevated troponins due to demand ischemia No anginal symptoms.  EKG without acute findings. Cardiology considering LHC/RHC this admission.   Acute kidney injury complicating stage IIIa CKD Baseline creatinine is probably in the 1.4-1.6 range.  Presented with creatinine of 2.53, up to 3.16  today Possibly cardiorenal etiology. Follow BMP daily with aggressive IV Lasix  diuresis. Avoid hypotension and nephrotoxic agents   Hypokalemia Hypomagnesemia Aggressively replace low potassium and follow. Magnesium  is better.  Continue to replace and follow.   Essential hypertension Controlled on bisoprolol  20 mg daily and amlodipine  5 mg at bedtime.   Dyslipidemia Continue rosuvastatin .   Type II DM, controlled A1c 4.7 on 5/1 suggests supertight control.  For now only monitor CBGs and if consistently >180 then consider very sensitive SSI. A1c 5.4 on 9/19. Sees Dr. Tawni Fendt, Endocrinologist Hypoglycemia with CBG of 60 on 9/20 morning.  Resuscitated with orange juice.  Patient states that he regularly takes 30 units of Lantus .   For now continue to hold all insulins.  Gradually initiate insulins as his CBGs increase.   Glycemic trends stable thus far -Will consult Diabetic Coordinator to follow   Paroxysmal atrial fibrillation Rate controlled.  Monitor on telemetry. Continue bisoprolol   -continued on heparin  gtt   History of stroke Continue Eliquis  and rosuvastatin .   Polysubstance abuse (cigars, alcohol, marijuana) Cessation counseled Nicotine  patch at patient's request CIWA protocol initiated.  No overt withdrawal.   History of gout No acute flare.  Continue allopurinol , dose may need to be adjusted to renal insufficiency   Depression Continue Wellbutrin .      Subjective: Without complaints when seen. Denies chest pain or sob  Physical Exam: Vitals:   03/15/24 0809 03/15/24 1027 03/15/24 1150 03/15/24 1622  BP: 121/86 106/81 134/83 109/69  Pulse: 60  63 70  Resp:   20   Temp: 97.8 F (36.6 C)  97.9 F (36.6 C) (!) 97.3 F (36.3 C)  TempSrc: Oral  Oral Oral  SpO2: 98%  98% 100%  Weight:      Height:       General exam: Conversant, in no acute distress Respiratory system: normal chest rise, clear, no audible wheezing Cardiovascular system:  regular rhythm, s1-s2 Gastrointestinal system: Nondistended, nontender, pos BS Central nervous system: No seizures, no tremors Extremities: No cyanosis, no joint deformities Skin: No rashes, no pallor Psychiatry: Affect normal // no auditory hallucinations   Data Reviewed:  Labs reviewed: Na 142, K 3.3, Cr 3.08, WBC 4.5, Hgb 11.4, Plts 277  Family Communication: Pt in room, family at bedside  Disposition: Status is: Inpatient Remains inpatient appropriate because: severity of illness  Planned Discharge Destination: Home    Author: Garnette Pelt, MD 03/15/2024 5:49 PM  For on call review [email or web address].

## 2024-03-15 NOTE — TOC Initial Note (Addendum)
 Transition of Care [facility]) - Initial/Assessment Note    Patient Details  Name: Keith Leblanc MRN: [id number] Date of Birth: 28-Jan-1966  Transition of Care [facility]) CM/SW Contact:    Sudie Erminio Deems, RN Phone Number: 03/15/2024, 12:22 PM  Clinical Narrative:  Patient presented for dyspnea and lower extremity edema. PTA patient was from home and has support of sister. Patient states he has PCP and no issues with transportation. Patient does not use any DME in the home. Inpatient Case Manager will continue to follow for disposition needs as the patient progresses.               Expected Discharge Plan: Home/Self Care Barriers to Discharge: Continued Medical Work up   Patient Goals and CMS Choice Patient states their goals for this hospitalization and ongoing recovery are:: Plan to return home once stable  Expected Discharge Plan and Services In-house Referral: NA Discharge Planning Services: CM Consult Post Acute Care Choice: NA Living arrangements for the past 2 months: Apartment                   DME Agency: NA  Prior Living Arrangements/Services Living arrangements for the past 2 months: Apartment Lives with:: Self (has support of sister.) Patient language and need for interpreter reviewed:: Yes Do you feel safe going back to the place where you live?: Yes      Need for Family Participation in Patient Care: No (Comment) Care giver support system in place?: No (comment)   Criminal Activity/Legal Involvement Pertinent to Current Situation/Hospitalization: No - Comment as needed  Activities of Daily Living   ADL Screening (condition at time of admission) Independently performs ADLs?: Yes (appropriate for developmental age) Is the patient deaf or have difficulty hearing?: No Does the patient have difficulty seeing, even when wearing glasses/contacts?: No Does the patient have difficulty concentrating, remembering, or making decisions?: No  Permission  Sought/Granted Permission sought to share information with : Case Manager, Family Supports   Emotional Assessment Appearance:: Appears stated age Attitude/Demeanor/Rapport: Engaged Affect (typically observed): Appropriate Orientation: : Oriented to Self, Oriented to Place, Oriented to  Time, Oriented to Situation Alcohol / Substance Use: Not Applicable Psych Involvement: No (comment)  Admission diagnosis:  Hypomagnesemia [E83.42] Elevated troponin [R79.89] AKI (acute kidney injury) (HCC) [N17.9] Acute on chronic diastolic CHF (congestive heart failure) (HCC) [I50.33] Acute congestive heart failure, unspecified heart failure type (HCC) [I50.9] Patient Active Problem List   Diagnosis Date Noted   Biventricular congestive heart failure (HCC) 03/13/2024   Acute on chronic diastolic CHF (congestive heart failure) (HCC) 03/12/2024   Alcohol use disorder 03/12/2024   Marijuana abuse 03/12/2024   Controlled diabetes mellitus (HCC) 03/12/2024   Acute kidney injury superimposed on chronic kidney disease (HCC) 03/12/2024   Paroxysmal A-fib (HCC) 03/12/2024   Dyslipidemia 03/12/2024   Slurred speech 11/09/2022   Stroke (cerebrum) (HCC) 11/09/2022   Hypertriglyceridemia 05/04/2018   Class 1 drug-induced obesity with serious comorbidity and body mass index (BMI) of 32.0 to 32.9 in adult 01/21/2017   Type 2 diabetes mellitus with hyperglycemia (HCC) 08/27/2016   Protein-calorie malnutrition, severe 07/08/2016   Diabetic ketoacidosis without coma associated with type 2 diabetes mellitus (HCC) 07/07/2016   Acute renal failure (ARF) (HCC) 07/07/2016   Hyponatremia 07/07/2016   Hyperkalemia 07/07/2016   Obesity (BMI 30-39.9) 07/07/2016   Tobacco abuse 07/07/2016   Overweight (BMI 25.0-29.9)    Blood in stool 05/21/2016   Essential hypertension 04/28/2015   Erectile dysfunction 04/28/2015   Gout 04/28/2015  PCP:  [name], NP Pharmacy:   [facility] [phone] - [place], [place] -  [address] AT [address] [place] [address] Phone: [phone] Fax: [phone]  Social Drivers of Health (SDOH) Social History: SDOH Screenings   Food Insecurity: No Food Insecurity (03/12/2024)  Housing: Low Risk  (03/12/2024)  Transportation Needs: No Transportation Needs (03/12/2024)  Utilities: Not At Risk (03/12/2024)  Depression (PHQ2-9): High Risk (09/12/2021)  Tobacco Use: High Risk (03/12/2024)  SDOH Interventions:     Readmission Risk Interventions     No data to display

## 2024-03-15 NOTE — TOC CM/SW Note (Signed)
 CSW attached outpatient substance use treatment services resources to patients AVS.

## 2024-03-15 NOTE — Progress Notes (Signed)
 Heart Failure Navigator Progress Note  Assessed for Heart & Vascular TOC clinic readiness.  Patient does not meet criteria due to Advanced Heart Failure Team consulted..   Navigator will sign off at this time.    Stephane Haddock, BSN, Scientist, clinical (histocompatibility and immunogenetics) Only

## 2024-03-15 NOTE — Discharge Instructions (Signed)

## 2024-03-15 NOTE — Consult Note (Addendum)
 Advanced Heart Failure Team Consult Note   Primary Physician: [name], NP Cardiologist:  None  Reason for Consultation: Acute HFmrEF  HPI:    Keith Leblanc is seen today for evaluation of Acute HFmrEF at the request of Dr. Margaretann, gen cards.   Keith Leblanc is a 58 y.o. AAM with DMII, HTN, HLD, hx stroke, CKD, PAF on eliquis , gout and polysubstance abuse (ETOH, cigar, marijuana).  Has had cardiac workup recently with ongoing SOB and constant congestion. Echo 8/25 showed EF 45-50% (drop from 60-65% 5/24) with severe LVH. PYP pursued which was not consistent with amyloid.   He presented to [facility] ED after reporting ongoing SOB, BLE edema and new orthopnea. Said the orthopnea was his last straw. SOB and BLE had been ongoing since April. Follows with PCP regularly. Reports compliance with all of his medications PTA and reports SBP at home ~110s when he checks. Smokes marijuana daily. Reports heavy ETOH use daily since he was an adult. Last discussed he was drinking a pint of tequila a day. Works a sedentary job as a Production designer, theatre/television/film for [employer]. Father with hx of CHF. Transferred to [facility] for further management/workup from AHF team.   Admission labs reviewed: K 4.1, SCr 2.53, HsTrop 186>176. Resp panel (-). Diuresis was started with low dose IV lasix . SCr uptrending initially, got up to 3.16. Diuretic dose increased yesterday and SCr now down trending. 3.08 today.   Daughter and sister at bedside. Reports no chest pain and no SOB.   Cardiac studies reviewed:  Echo 8/25: EF 45-50%, severe LVH, abnormal RV free wall strain, ~17%, RV mildly reduced, mildly elevated PASP. LA mod dilated, RA mildly dilated, small pericardial effusion present, trivial MR, mild TR, tricuspid valve abnormally thickened, aortic valve regurgitation trivial.  PYP 8/25 not suggestive of cardiac amyloid.  Echo 5/24: EF 60-65%, mod LVH, RV normal, no MR  Home Medications Prior to Admission medications   Medication Sig  Start Date End Date Taking? Authorizing Provider  allopurinol  (ZYLOPRIM ) 300 MG tablet TAKE 1 TABLET BY MOUTH DAILY 03/07/23  Yes Nafziger, Huxley, NP  amLODipine  (NORVASC ) 5 MG tablet Take 1 tablet (5 mg total) by mouth daily. 12/11/23  Yes Wyn Jackee VEAR Mickey., NP  apixaban  (ELIQUIS ) 5 MG TABS tablet Take 1 tablet (5 mg total) by mouth 2 (two) times daily. 05/16/23  Yes West, Katlyn D, NP  bisoprolol  (ZEBETA ) 10 MG tablet TAKE 2 TABLETS BY MOUTH DAILY Patient taking differently: Take 20 mg by mouth in the morning. 11/12/23  Yes Nafziger, Huxley, NP  buPROPion  (WELLBUTRIN  XL) 300 MG 24 hr tablet TAKE 1 TABLET BY MOUTH ONCE  DAILY 09/09/23  Yes Nafziger, Cory, NP  CLEAR EYES NATURAL TEARS 5-6 MG/ML SOLN Place 1 drop into both eyes 3 (three) times daily as needed (for dryness).   Yes [provider]  colchicine  0.6 MG tablet TAKE 1 TABLET BY MOUTH DAILY  WHEN HAVING FLARES TAKE 1 TABLET BY MOUTH TWICE DAILY Patient taking differently: Take 0.6 mg by mouth as needed. TAKE 1 TABLET BY MOUTH  DAILY WHEN HAVING FLARES  TAKE 1 TABLET BY MOUTH  TWICE DAILY 09/05/23  Yes Nafziger, Huxley, NP  furosemide  (LASIX ) 20 MG tablet TAKE 1 TABLET(20 MG) BY MOUTH DAILY 01/08/24  Yes Wyn Jackee VEAR Mickey., NP  insulin  glargine (LANTUS  SOLOSTAR) 100 UNIT/ML Solostar Pen Inject 24 Units into the skin daily. 10/23/23  Yes Trixie File, MD  magnesium  oxide (MAG-OX) 400 (240 Mg) MG tablet TAKE 1  TABLET(400 MG) BY MOUTH DAILY 02/12/24  Yes West, Katlyn D, NP  Multiple Vitamins-Minerals (ONE-A-DAY MENS 50+) TABS Take 1 tablet by mouth daily with breakfast.   Yes [provider]  omeprazole  (PRILOSEC) 40 MG capsule TAKE 1 CAPSULE BY MOUTH DAILY Patient taking differently: Take 40 mg by mouth daily before breakfast. 11/12/23  Yes Nafziger, Darleene, NP  potassium chloride  (KLOR-CON ) 10 MEQ tablet TAKE 1 TABLET(10 MEQ) BY MOUTH DAILY Patient taking differently: Take 10 mEq by mouth in the morning. 01/08/24  Yes Wyn Jackee VEAR Mickey.,  NP  rosuvastatin  (CRESTOR ) 20 MG tablet TAKE 1 TABLET BY MOUTH DAILY 02/13/24  Yes Nafziger, Darleene, NP  sildenafil  (REVATIO ) 20 MG tablet TAKE 2-5 TABLETS BY MOUTH AS NEEDED FOR SEXUAL ACTIVITY Patient taking differently: Take 20 mg by mouth daily as needed (For ED). 08/23/22  Yes Nafziger, Darleene, NP  thiamine  (VITAMIN B-1) 100 MG tablet Take 1 tablet (100 mg total) by mouth daily. 11/11/22  Yes Denna Mimi ORN, NP  blood glucose meter kit and supplies Dispense based on patient and insurance preference. Use up to four times daily as directed. (FOR ICD-9 250.00, 250.01). 07/09/16   Sebastian Toribio GAILS, MD  glucose blood (ONETOUCH ULTRA TEST) test strip Use to check blood sugar 2-3 times a day. 03/01/24   Trixie File, MD  Insulin  Pen Needle (B-D UF III MINI PEN NEEDLES) 31G X 5 MM MISC USE 4 TIMES DAILY AFTER MEALS AND AT BEDTIME 02/24/20   Trixie File, MD  Lancets [facility] ULTRASOFT) lancets Test blood sugars as directed. Dx E11.9 08/06/16   Merna Darleene, NP    Past Medical History: Past Medical History:  Diagnosis Date   Blood in stool    bright red blood    Chicken pox    Diabetes mellitus (HCC)    Erectile dysfunction    Gout    Hypertension    Stroke [facility])     Past Surgical History: Past Surgical History:  Procedure Laterality Date   APPENDECTOMY  2004   COLONOSCOPY     TOOTH EXTRACTION  07/06/2020    Family History: Family History  Problem Relation Age of Onset   Alcohol abuse Father    Hypertension Father    Heart disease Father    Gout Father    Breast cancer Mother    Hypertension Mother    Gout Mother    Healthy Sister    Healthy Daughter    Colon cancer Neg Hx    Esophageal cancer Neg Hx    Rectal cancer Neg Hx    Stomach cancer Neg Hx     Social History: Social History   Socioeconomic History   Marital status: Single    Spouse name: Not on file   Number of children: Not on file   Years of education: Not on file   Highest education level: Not on  file  Occupational History   Not on file  Tobacco Use   Smoking status: Every Day    Types: Cigarettes, Cigars   Smokeless tobacco: Never  Vaping Use   Vaping status: Never Used  Substance and Sexual Activity   Alcohol use: Yes    Comment: Drinks 1 pint of tequila daily.   Drug use: Yes    Types: Marijuana   Sexual activity: Yes  Other Topics Concern   Not on file  Social History Narrative   Works for [employer]    Not married    One daughter who does not live  with him    Likes to gamble, walk in the parks, travel.    Social Drivers of Corporate investment banker Strain: Not on file  Food Insecurity: No Food Insecurity (03/12/2024)   Hunger Vital Sign    Worried About Running Out of Food in the Last Year: Never true    Ran Out of Food in the Last Year: Never true  Transportation Needs: No Transportation Needs (03/12/2024)   PRAPARE - Administrator, Civil Service (Medical): No    Lack of Transportation (Non-Medical): No  Physical Activity: Not on file  Stress: Not on file  Social Connections: Not on file    Allergies:  No Known Allergies  Objective:    Vital Signs:   Temp:  [97.8 F (36.6 C)-98.5 F (36.9 C)] 97.8 F (36.6 C) (09/22 0809) Pulse Rate:  [56-62] 60 (09/22 0809) Resp:  [16-20] 20 (09/22 0500) BP: (101-121)/(72-86) 121/86 (09/22 0809) SpO2:  [97 %-100 %] 98 % (09/22 0809) Weight:  [111.5 kg] 111.5 kg (09/22 0500) Last BM Date : 03/14/24  Weight change: Filed Weights   03/13/24 1518 03/14/24 0419 03/15/24 0500  Weight: 121.8 kg 120.2 kg 111.5 kg    Intake/Output:   Intake/Output Summary (Last 24 hours) at 03/15/2024 0910 Last data filed at 03/15/2024 0500 Gross per 24 hour  Intake 1120 ml  Output 1750 ml  Net -630 ml      Physical Exam   General:  well appearing, sitting on EOB.  No respiratory difficulty Neck: JVD ~8 cm.  Cor: Irregular rate & rhythm. No murmurs. Lungs: clear Extremities: non-pitting BLE edema but legs are  tight Neuro: alert & oriented x 3. Affect pleasant.   Telemetry   A fib 60s (Personally reviewed)    EKG    A fib 73 bpm 03/12/24  Labs   Basic Metabolic Panel: Recent Labs  Lab 03/12/24 0942 03/13/24 0418 03/14/24 0338 03/15/24 0546  NA 144 142 141 142  K 4.1 3.2* 3.5 3.3*  CL 105 103 99 97*  CO2 22 26 30  33*  GLUCOSE 77 53* 109* 113*  BUN 25* 24* 23* 21*  CREATININE 2.53* 2.63* 3.16* 3.08*  CALCIUM  9.7 9.3 9.3 9.3  MG 1.6* 1.7  --   --     Liver Function Tests: No results for input(s): AST, ALT, ALKPHOS, BILITOT, PROT, ALBUMIN in the last 168 hours. No results for input(s): LIPASE, AMYLASE in the last 168 hours. No results for input(s): AMMONIA in the last 168 hours.  CBC: Recent Labs  Lab 03/12/24 0942 03/13/24 0418 03/14/24 0338 03/15/24 0546  WBC 5.0 4.8 5.0 4.5  HGB 13.0 11.2* 11.1* 11.4*  HCT 42.9 34.5* 34.8* 36.1*  MCV 96.2 94.5 92.1 92.8  PLT 286 264 258 277    Cardiac Enzymes: No results for input(s): CKTOTAL, CKMB, CKMBINDEX, TROPONINI in the last 168 hours.  BNP: BNP (last 3 results) No results for input(s): BNP in the last 8760 hours.  ProBNP (last 3 results) Recent Labs    03/12/24 0942  PROBNP 5,364.0*     CBG: Recent Labs  Lab 03/13/24 2153 03/14/24 0830 03/14/24 1214 03/14/24 1551 03/14/24 2125  GLUCAP 174* 105* 130* 131* 202*    Coagulation Studies: No results for input(s): LABPROT, INR in the last 72 hours.   Imaging   No results found.   Medications:     Current Medications:  allopurinol   300 mg Oral Daily   amLODipine   5 mg Oral  Daily   bisoprolol   20 mg Oral q AM   buPROPion   300 mg Oral Daily   empagliflozin   10 mg Oral Daily   folic acid   1 mg Oral Daily   furosemide   80 mg Intravenous Q12H   multivitamin with minerals  1 tablet Oral Daily   nicotine   7 mg Transdermal Daily   pantoprazole   40 mg Oral Daily   potassium chloride   40 mEq Oral BID   rosuvastatin   20  mg Oral Daily   sodium chloride  flush  3 mL Intravenous Q12H   sodium chloride  flush  3 mL Intravenous Q12H   thiamine   100 mg Oral Daily   Or   thiamine   100 mg Intravenous Daily    Infusions:  sodium chloride         Patient Profile   Marin Milley is a 58 y.o. AAM with DMII, HTN, HLD, hx stroke, CKD, PAF on eliquis , gout and polysubstance abuse (ETOH, cigar, marijuana). Admitted with acute HFpEF  Assessment/Plan  Acute HFmrEF - Echo 8/25: EF 45-50%, severe LVH, abnormal RV free wall strain, ~17%, RV mildly reduced, mildly elevated PASP. LA mod dilated, RA mildly dilated, small pericardial effusion present, trivial MR, mild TR, tricuspid valve abnormally thickened, aortic valve regurgitation trivial.  - Need to r/o iCM. HTN CM (though he reports normal BP readings at home consistently), ETOH induced CM? , tachy mediated (though rate-controlled a fib here)? Infiltrative heart disease? (Pending further w/u). Familial? (Father with CHF) - NYHA IIIb on admission - Volume difficult to gauge on exam. JVD not significantly elevated. BLE tight.  Continue lasix  80 IV bid for now. Plan to revisit post RHC.  - Place ted hose - GDMT limited with AKI, consider SGLT2i tomorrow  - Plan for RHC today vs tomorrow (pending biopsy decision) - Plan LHC if there is renal recovery - cMRI today  Concern for cardiac amyloid - PYP 8/25 not suggestive of cardiac amyloid.  - low-voltage EKG - Does not report carpal tunnel or frequent back pain - Severe LVH on echo this admission - cMRI today - Consider cardiac biopsy - MMP pending  PAF - Suspect cause of stroke back in 5/24 - Eliquis  stopped yesterday? - Start hep gtt - rate controlled on tele - Consider DCCV, +/- TEE if noncompliant with eliquis  at home  HTN - Reports compliance with BP meds at home with controlled BP - BP stable  - Continue current regimen.  - Consider bidil   AKI on CKD stage IIIa - Baseline SCr ~ 1.5 - Up to 3.16 -  Now downtrending with diuresis - avoid hypotension  ETOH abuse Marijuana abuse - Drinks 1 pint of tequila/day. Smokes marijuana daily - Monitor for signs of withdrawal, does not appear to need CIWA at this time - Cessation advised   Length of Stay: 3  Beckey LITTIE Coe, NP  03/15/2024, 9:10 AM   Advanced Heart Failure Team Pager [phone] (M-F; 7a - 5p)  Please contact [facility] for night-coverage after hours (4p -7a ) and weekends on [email or web address]

## 2024-03-16 ENCOUNTER — Inpatient Hospital Stay (HOSPITAL_COMMUNITY)

## 2024-03-16 ENCOUNTER — Encounter (HOSPITAL_COMMUNITY)
Admission: EM | Disposition: A | Discharge status: Home / Self Care | Payer: Self-pay | Source: Home / Self Care | Attending: Internal Medicine

## 2024-03-16 DIAGNOSIS — D869 Sarcoidosis, unspecified: Secondary | ICD-10-CM

## 2024-03-16 DIAGNOSIS — N179 Acute kidney failure, unspecified: Secondary | ICD-10-CM | POA: Diagnosis not present

## 2024-03-16 DIAGNOSIS — E854 Organ-limited amyloidosis: Secondary | ICD-10-CM | POA: Diagnosis not present

## 2024-03-16 DIAGNOSIS — I509 Heart failure, unspecified: Secondary | ICD-10-CM | POA: Diagnosis not present

## 2024-03-16 DIAGNOSIS — I5021 Acute systolic (congestive) heart failure: Secondary | ICD-10-CM | POA: Diagnosis not present

## 2024-03-16 DIAGNOSIS — R7989 Other specified abnormal findings of blood chemistry: Secondary | ICD-10-CM | POA: Diagnosis not present

## 2024-03-16 DIAGNOSIS — I5033 Acute on chronic diastolic (congestive) heart failure: Secondary | ICD-10-CM | POA: Diagnosis not present

## 2024-03-16 HISTORY — PX: RIGHT HEART CATH: CATH118263

## 2024-03-16 HISTORY — PX: ENDOMYOCARDIAL BIOPSY: CATH118375

## 2024-03-16 LAB — MULTIPLE MYELOMA PANEL, SERUM
Albumin SerPl Elph-Mcnc: 3.4 g/dL (ref 2.9–4.4)
Albumin/Glob SerPl: 0.9 (ref 0.7–1.7)
Alpha 1: 0.4 g/dL (ref 0.0–0.4)
Alpha2 Glob SerPl Elph-Mcnc: 0.8 g/dL (ref 0.4–1.0)
B-Globulin SerPl Elph-Mcnc: 1.4 g/dL — ABNORMAL HIGH (ref 0.7–1.3)
Gamma Glob SerPl Elph-Mcnc: 1.5 g/dL (ref 0.4–1.8)
Globulin, Total: 4.1 g/dL — ABNORMAL HIGH (ref 2.2–3.9)
IgA: 497 mg/dL — ABNORMAL HIGH (ref 90–386)
IgG (Immunoglobin G), Serum: 1781 mg/dL — ABNORMAL HIGH (ref 603–1613)
IgM (Immunoglobulin M), Srm: 68 mg/dL (ref 20–172)
Total Protein ELP: 7.5 g/dL (ref 6.0–8.5)

## 2024-03-16 LAB — BASIC METABOLIC PANEL WITH GFR
Anion gap: 14 (ref 5–15)
BUN: 22 mg/dL — ABNORMAL HIGH (ref 6–20)
CO2: 32 mmol/L (ref 22–32)
Calcium: 9.5 mg/dL (ref 8.9–10.3)
Chloride: 95 mmol/L — ABNORMAL LOW (ref 98–111)
Creatinine, Ser: 2.79 mg/dL — ABNORMAL HIGH (ref 0.61–1.24)
GFR, Estimated: 26 mL/min — ABNORMAL LOW (ref >60)
Glucose, Bld: 109 mg/dL — ABNORMAL HIGH (ref 70–99)
Potassium: 3.7 mmol/L (ref 3.5–5.1)
Sodium: 141 mmol/L (ref 135–145)

## 2024-03-16 LAB — CBC
HCT: 38 % — ABNORMAL LOW (ref 39.0–52.0)
Hemoglobin: 12.1 g/dL — ABNORMAL LOW (ref 13.0–17.0)
MCH: 29.2 pg (ref 26.0–34.0)
MCHC: 31.8 g/dL (ref 30.0–36.0)
MCV: 91.8 fL (ref 80.0–100.0)
Platelets: 298 K/uL (ref 150–400)
RBC: 4.14 MIL/uL — ABNORMAL LOW (ref 4.22–5.81)
RDW: 13.8 % (ref 11.5–15.5)
WBC: 4.6 K/uL (ref 4.0–10.5)
nRBC: 0 % (ref 0.0–0.2)

## 2024-03-16 LAB — POCT I-STAT EG7
Acid-Base Excess: 11 mmol/L — ABNORMAL HIGH (ref 0.0–2.0)
Acid-Base Excess: 11 mmol/L — ABNORMAL HIGH (ref 0.0–2.0)
Bicarbonate: 36.4 mmol/L — ABNORMAL HIGH (ref 20.0–28.0)
Bicarbonate: 36.7 mmol/L — ABNORMAL HIGH (ref 20.0–28.0)
Calcium, Ion: 1.16 mmol/L (ref 1.15–1.40)
Calcium, Ion: 1.2 mmol/L (ref 1.15–1.40)
HCT: 39 % (ref 39.0–52.0)
HCT: 40 % (ref 39.0–52.0)
Hemoglobin: 13.3 g/dL (ref 13.0–17.0)
Hemoglobin: 13.6 g/dL (ref 13.0–17.0)
O2 Saturation: 66 %
O2 Saturation: 67 %
Potassium: 3.9 mmol/L (ref 3.5–5.1)
Potassium: 4 mmol/L (ref 3.5–5.1)
Sodium: 141 mmol/L (ref 135–145)
Sodium: 141 mmol/L (ref 135–145)
TCO2: 38 mmol/L — ABNORMAL HIGH (ref 22–32)
TCO2: 38 mmol/L — ABNORMAL HIGH (ref 22–32)
pCO2, Ven: 51.9 mmHg (ref 44–60)
pCO2, Ven: 52.6 mmHg (ref 44–60)
pH, Ven: 7.451 — ABNORMAL HIGH (ref 7.25–7.43)
pH, Ven: 7.453 — ABNORMAL HIGH (ref 7.25–7.43)
pO2, Ven: 33 mmHg (ref 32–45)
pO2, Ven: 34 mmHg (ref 32–45)

## 2024-03-16 LAB — APTT: aPTT: 64 s — ABNORMAL HIGH (ref 24–36)

## 2024-03-16 LAB — ECHOCARDIOGRAM LIMITED
Height: 77 in
Weight: 3785.6 [oz_av]

## 2024-03-16 LAB — GLUCOSE, CAPILLARY
Glucose-Capillary: 117 mg/dL — ABNORMAL HIGH (ref 70–99)
Glucose-Capillary: 117 mg/dL — ABNORMAL HIGH (ref 70–99)
Glucose-Capillary: 144 mg/dL — ABNORMAL HIGH (ref 70–99)

## 2024-03-16 LAB — MAGNESIUM: Magnesium: 1.8 mg/dL (ref 1.7–2.4)

## 2024-03-16 LAB — HEPARIN LEVEL (UNFRACTIONATED): Heparin Unfractionated: 0.89 [IU]/mL — ABNORMAL HIGH (ref 0.30–0.70)

## 2024-03-16 SURGERY — ENDOMYOCARDIAL BIOPSY
Anesthesia: LOCAL

## 2024-03-16 MED ORDER — GADOBUTROL 1 MMOL/ML IV SOLN
10.0000 mL | Freq: Once | INTRAVENOUS | Status: AC | PRN
Start: 2024-03-16 — End: 2024-03-16
  Administered 2024-03-16: 10 mL via INTRAVENOUS

## 2024-03-16 MED ORDER — MIDAZOLAM HCL 2 MG/2ML IJ SOLN
INTRAMUSCULAR | Status: DC | PRN
  Administered 2024-03-16: 1 mg via INTRAVENOUS

## 2024-03-16 MED ORDER — HEPARIN (PORCINE) IN NACL 1000-0.9 UT/500ML-% IV SOLN
INTRAVENOUS | Status: DC | PRN
  Administered 2024-03-16: 500 mL

## 2024-03-16 MED ORDER — LIDOCAINE HCL (PF) 1 % IJ SOLN
INTRAMUSCULAR | Status: DC | PRN
  Administered 2024-03-16: 2 mL via INTRADERMAL

## 2024-03-16 MED ORDER — MAGNESIUM SULFATE 2 GM/50ML IV SOLN
2.0000 g | Freq: Once | INTRAVENOUS | Status: AC
  Administered 2024-03-16: 2 g via INTRAVENOUS
  Filled 2024-03-16: qty 50

## 2024-03-16 MED ORDER — POTASSIUM CHLORIDE CRYS ER 10 MEQ PO TBCR
40.0000 meq | EXTENDED_RELEASE_TABLET | Freq: Once | ORAL | Status: AC
Start: 2024-03-16 — End: 2024-03-16
  Administered 2024-03-16: 40 meq via ORAL
  Filled 2024-03-16: qty 4

## 2024-03-16 MED ORDER — LIDOCAINE HCL (PF) 1 % IJ SOLN
INTRAMUSCULAR | Status: AC
  Filled 2024-03-16: qty 30

## 2024-03-16 MED ORDER — MIDAZOLAM HCL 2 MG/2ML IJ SOLN
INTRAMUSCULAR | Status: AC
  Filled 2024-03-16: qty 2

## 2024-03-16 MED ORDER — FENTANYL CITRATE (PF) 100 MCG/2ML IJ SOLN
INTRAMUSCULAR | Status: AC
  Filled 2024-03-16: qty 2

## 2024-03-16 MED ORDER — FENTANYL CITRATE (PF) 100 MCG/2ML IJ SOLN
INTRAMUSCULAR | Status: DC | PRN
  Administered 2024-03-16: 25 ug via INTRAVENOUS

## 2024-03-16 MED ORDER — APIXABAN 5 MG PO TABS
5.0000 mg | ORAL_TABLET | Freq: Two times a day (BID) | ORAL | Status: DC
  Administered 2024-03-17 – 2024-03-18 (×3): 5 mg via ORAL
  Filled 2024-03-16 (×3): qty 1

## 2024-03-16 SURGICAL SUPPLY — 9 items
CATH SWAN GANZ 7F STRAIGHT (CATHETERS) IMPLANT
FORCEPS JAWZ 7FR 2.2 PCRVD 50 (FORCEP) IMPLANT
KIT RIGHT HEART ACIST (MISCELLANEOUS) IMPLANT
PACK CARDIAC CATHETERIZATION (CUSTOM PROCEDURE TRAY) ×1 IMPLANT
SHEATH PINNACLE VASC 9FR (SHEATH) IMPLANT
SHEATH PROBE COVER 6X72 (BAG) IMPLANT
WIRE EMERALD 3MM-J .025X260CM (WIRE) IMPLANT
WIRE EMERALD 3MM-J .035X150CM (WIRE) IMPLANT
WIRE MICRO SET SILHO 5FR 7 (SHEATH) IMPLANT

## 2024-03-16 NOTE — Interval H&P Note (Signed)
 History and Physical Interval Note:  03/16/2024 7:42 AM  Keith Leblanc  has presented today for surgery, with the diagnosis of HF.  The various methods of treatment have been discussed with the patient and family. After consideration of risks, benefits and other options for treatment, the patient has consented to  Procedure(s): ENDOMYOCARDIAL BIOPSY (N/A) RIGHT HEART CATH (N/A) as a surgical intervention.  The patient's history has been reviewed, patient examined, no change in status, stable for surgery.  I have reviewed the patient's chart and labs.  Questions were answered to the patient's satisfaction.     [name]

## 2024-03-16 NOTE — Progress Notes (Signed)
 PHARMACY - ANTICOAGULATION CONSULT NOTE  Pharmacy Consult for heparin  Indication: atrial fibrillation  No Known Allergies  Patient Measurements: Height: 6' 5 (195.6 cm) Weight: 107.3 kg (236 lb 9.6 oz) IBW/kg (Calculated) : 89.1 HEPARIN  DW (KG): 114.5  Vital Signs: Temp: 98.1 F (36.7 C) (09/23 0451) Temp Source: Oral (09/23 0451) BP: 119/89 (09/23 0451) Pulse Rate: 64 (09/23 0451)  Labs: Recent Labs    03/14/24 0338 03/15/24 0546 03/15/24 1814 03/16/24 0436  HGB 11.1* 11.4*  --  12.1*  HCT 34.8* 36.1*  --  38.0*  PLT 258 277  --  298  APTT  --   --  50* 64*  HEPARINUNFRC  --   --  >1.10* 0.89*  CREATININE 3.16* 3.08*  --  2.79*    Estimated Creatinine Clearance: 40.3 mL/min (A) (by C-G formula based on SCr of 2.79 mg/dL (H)).   Assessment: 34 yoM with hx AF on apixaban  PTA admitted for acute CHF. Pharmacy to dose IV heparin  with possible cardiac biopsy this admit. Last apixaban  dose 9/21 so may need to dose via aPTT initially.  PM update: aPTT 50 is subtherapeutic, HL > 1.1 as expected with apixaban  use. No issues with the heparin  infusion or bleeding reported per RN.  AM: aPTT is subtherapeutic at 64 sec on 1650 units/hr. Heparin  level 0.89 and still affected by recent apixaban . No bleeding or infusion issues per RN. CBC stable.  Goal of Therapy:  Heparin  level 0.3-0.7 units/ml aPTT 66-102 seconds Monitor platelets by anticoagulation protocol: Yes   Plan:  Increase heparin  drip to 1850 units/hr - no bolus Check 8h aPTT Daily heparin  level, aPTT, CBC Monitor for s/sx of bleeding  Thank you for involving pharmacy in this patient's care.  Delon Sax, PharmD, BCPS Clinical Pharmacist 03/16/2024 6:25 AM

## 2024-03-16 NOTE — Progress Notes (Signed)
 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  Rest of the history as per HPI on H&P.   He was admitted with diagnosis of acute on chronic diastolic CHF/anasarca elevated troponin secondary to demand ischemia, acute on chronic kidney disease.  Cardiology was consulted.  He was started on IV Lasix .  Cardiology recommended transfer to [facility] by evaluation and management by advanced heart failure team and may eventually need R/L HC.   9/20: Transfer initiated from [name] to [facility]  Assessment and Plan: Acute on chronic diastolic CHF TTE 06/27/7972: LVEF 45-50%, severe LVH, wall motion abnormalities.  Left ventricular diastolic parameters were indeterminate.   PYP scan for amyloidosis which was negative on 02/12/2024.  Cardiology following -Cont Jardiance  10 mg daily and home dose of bisoprolol  20 mg daily. -Concerns of biventricular failure.  -Pt now s/p RHC and cardiac MRI with biopsy. Findings c/w cardiac amyloid -Will likely require transplant in future, but needs to quit drinking first -Cont IV lasix  per Cardiology -Planning Amvutra as outpt   Elevated troponins due to demand ischemia No anginal symptoms.  EKG without acute findings.   Acute kidney injury complicating stage IIIa CKD Baseline creatinine is probably in the 1.4-1.6 range.  Presented with creatinine of 2.53, up  to 3.16 today Possibly cardiorenal etiology. Follow BMP daily with aggressive IV Lasix  diuresis. Avoid hypotension and nephrotoxic agents   Hypokalemia Hypomagnesemia Aggressively replace low potassium and follow.   Essential hypertension Controlled on bisoprolol  20 mg daily and amlodipine  5 mg at bedtime.   Dyslipidemia Continue rosuvastatin .   Type II DM, controlled A1c 4.7 on 5/1 suggests supertight control.  For now only monitor CBGs and if consistently >180 then consider very sensitive SSI. A1c 5.4 on 9/19. Sees Dr. Tawni Fendt, Endocrinologist Hypoglycemia with CBG of 60 on 9/20 morning.  Resuscitated with orange juice.  Patient states that he regularly takes 30 units of Lantus .   For now continue to hold all insulins.  Gradually initiate insulins as his CBGs increase.   Glycemic trends stable thus far -Diabetic Coordinator consulted   Paroxysmal atrial fibrillation Rate controlled.  Monitor on telemetry. Continue bisoprolol   -was on heparin  gtt, now back on eliquis    History of stroke Continue Eliquis  and rosuvastatin .   Polysubstance abuse (cigars, alcohol, marijuana) Cessation counseled Nicotine  patch at patient's request CIWA protocol initiated.  No overt withdrawal.   History of gout No acute flare.  Continue allopurinol , dose may need to be adjusted to renal insufficiency   Depression Continue Wellbutrin .      Subjective: Seen after cardiac MRI and biopsy. Without complaints. No chest pains  Physical Exam: Vitals:   03/16/24 0842 03/16/24 1208 03/16/24 1540 03/16/24 1623  BP: 107/81 118/81 108/74 106/78  Pulse: 65   60  Resp: 18 16 16 17   Temp: 98.2 F (36.8 C) 98.3 F (36.8 C) 98.7 F (37.1 C) 98.1 F (36.7 C)  TempSrc: Oral Oral Oral Oral  SpO2: 97%  100%  99%  Weight:      Height:       General exam: Awake, laying in bed, in nad Respiratory system: Normal respiratory effort, no wheezing Cardiovascular system: regular rate, s1,  s2 Gastrointestinal system: Soft, nondistended, positive BS Central nervous system: CN2-12 grossly intact, strength intact Extremities: Perfused, no clubbing Skin: Normal skin turgor, no notable skin lesions seen Psychiatry: Mood normal // no visual hallucinations   Data Reviewed:  Labs reviewed: Na 141, K 3.7, Cr 2.79, WBC 4.6, Hgb 12.1, Plts 298  Family Communication: Pt in room, family at bedside  Disposition: Status is: Inpatient Remains inpatient appropriate because: severity of illness  Planned Discharge Destination: Home    Author: Garnette Pelt, MD 03/16/2024 5:49 PM  For on call review [email or web address].

## 2024-03-16 NOTE — Progress Notes (Signed)
  Echocardiogram 2D Echocardiogram has been performed.  Thea Norlander 03/16/2024, 8:36 AM

## 2024-03-16 NOTE — TOC Progression Note (Signed)
 Transition of Care [facility]) - Progression Note    Patient Details  Name: Edgardo Petrenko MRN: [id number] Date of Birth: February 19, 1966  [facility] CM/SW Contact  Justina Delcia Czar, RN Phone Number: [phone] 03/16/2024, 5:23 PM  Clinical Narrative:     Spoke to pt and states he has scale at home for daily weights. Provided pt with Living Better with HF booklet. Discussed low sodium/heart healthy diet. Pt states he is independent at home and works full-time.   Sister will provide transportation home. Will schedule hospital follow up with PCP at time of dc.   Expected Discharge Plan: Home/Self Care Barriers to Discharge: Continued Medical Work up    Expected Discharge Plan and Services In-house Referral: NA Discharge Planning Services: CM Consult Post Acute Care Choice: NA Living arrangements for the past 2 months: Apartment                   DME Agency: NA       Social Drivers of Health (SDOH) Interventions SDOH Screenings   Food Insecurity: No Food Insecurity (03/12/2024)  Housing: Low Risk  (03/12/2024)  Transportation Needs: No Transportation Needs (03/12/2024)  Utilities: Not At Risk (03/12/2024)  Depression (PHQ2-9): High Risk (09/12/2021)  Tobacco Use: High Risk (03/12/2024)    Readmission Risk Interventions    03/15/2024   12:35 PM  Readmission Risk Prevention Plan  Transportation Screening Complete  HRI or Home Care Consult Complete  Social Work Consult for Recovery Care Planning/Counseling Complete  Palliative Care Screening Not Applicable  Medication Review Oceanographer) Referral to Pharmacy

## 2024-03-16 NOTE — Progress Notes (Signed)
 Advanced Heart Failure Rounding Note  Cardiologist: None  Chief Complaint: Heart Failure  Subjective:    S/P Cath with endomyocardial biopsy. Elevated filling pressures, severely reduced index.    Denies SOB.    Objective:   Weight Range: 107.3 kg Body mass index is 28.06 kg/m.   Vital Signs:   Temp:  [97.3 F (36.3 C)-98.5 F (36.9 C)] 98.3 F (36.8 C) (09/23 1208) Pulse Rate:  [57-84] 65 (09/23 0842) Resp:  [15-24] 16 (09/23 1208) BP: (107-127)/(68-90) 118/81 (09/23 1208) SpO2:  [91 %-100 %] 97 % (09/23 0842) Weight:  [107.3 kg] 107.3 kg (09/23 0451) Last BM Date : 03/14/24  Weight change: Filed Weights   03/14/24 0419 03/15/24 0500 03/16/24 0451  Weight: 120.2 kg 111.5 kg 107.3 kg    Intake/Output:   Intake/Output Summary (Last 24 hours) at 03/16/2024 1236 Last data filed at 03/16/2024 0407 Gross per 24 hour  Intake 714.86 ml  Output --  Net 714.86 ml      Physical Exam  General:   No resp difficulty Neck: no JVD.  Cor: Irregular rate & rhythm.  Lungs: clear Abdomen: soft, nontender, nondistended.  Extremities: no  edema Neuro: alert & oriented x3   Telemetry   A fib 60-70s   EKG    N/A   Labs    CBC Recent Labs    03/15/24 0546 03/16/24 0436  WBC 4.5 4.6  HGB 11.4* 12.1*  HCT 36.1* 38.0*  MCV 92.8 91.8  PLT 277 298   Basic Metabolic Panel Recent Labs    90/77/74 0546 03/16/24 0436 03/16/24 0830  NA 142 141  --   K 3.3* 3.7  --   CL 97* 95*  --   CO2 33* 32  --   GLUCOSE 113* 109*  --   BUN 21* 22*  --   CREATININE 3.08* 2.79*  --   CALCIUM  9.3 9.5  --   MG  --   --  1.8   Liver Function Tests No results for input(s): AST, ALT, ALKPHOS, BILITOT, PROT, ALBUMIN in the last 72 hours. No results for input(s): LIPASE, AMYLASE in the last 72 hours. Cardiac Enzymes No results for input(s): CKTOTAL, CKMB, CKMBINDEX, TROPONINI in the last 72 hours.  BNP: BNP (last 3 results) No results for  input(s): BNP in the last 8760 hours.  ProBNP (last 3 results) Recent Labs    03/12/24 0942  PROBNP 5,364.0*     D-Dimer No results for input(s): DDIMER in the last 72 hours. Hemoglobin A1C No results for input(s): HGBA1C in the last 72 hours. Fasting Lipid Panel No results for input(s): CHOL, HDL, LDLCALC, TRIG, CHOLHDL, LDLDIRECT in the last 72 hours. Thyroid  Function Tests No results for input(s): TSH, T4TOTAL, T3FREE, THYROIDAB in the last 72 hours.  Invalid input(s): FREET3  Other results:   Imaging    CARDIAC CATHETERIZATION Result Date: 03/16/2024 HEMODYNAMICS: RA:       13 mmHg (mean) RV:       50/4, 13 mmHg PA:       50/22 mmHg (32 mean) PCWP: 22 mmHg (mean) with v waves to    Estimated Fick CO/CI   7.06L/min, 2.89L/min/m2 Thermodilution CO/CI   4.48L/min, 1.83L/min/m2    TPG  10  mmHg     PVR  2.23 Wood Units PAPi  2.1  IMPRESSION: Right heart catheterization and endomyocardial biopsy with 4 specimens obtained Moderately elevated biventricular filling pressures Severely reduced index by thermodilution Moderate group II  PH with evidence of noncompliant left atrium Post procedure RA and echo similar to pre RECOMMENDATIONS: Await specimen results, findings communicated to HF team     Medications:     Scheduled Medications:  allopurinol   300 mg Oral Daily   amLODipine   5 mg Oral Daily   bisoprolol   20 mg Oral q AM   buPROPion   300 mg Oral Daily   empagliflozin   10 mg Oral Daily   folic acid   1 mg Oral Daily   furosemide   80 mg Intravenous BID   multivitamin with minerals  1 tablet Oral Daily   nicotine   7 mg Transdermal Daily   pantoprazole   40 mg Oral Daily   potassium chloride   40 mEq Oral BID   potassium chloride   40 mEq Oral Once   rosuvastatin   20 mg Oral Daily   sodium chloride  flush  3 mL Intravenous Q12H   thiamine   100 mg Oral Daily   Or   thiamine   100 mg Intravenous Daily    Infusions:  magnesium  sulfate bolus  IVPB 2 g (03/16/24 1217)    PRN Medications: acetaminophen  **OR** acetaminophen , albuterol , polyethylene glycol    Patient Profile    Keith Leblanc is a 58 y.o. AAM with DMII, HTN, HLD, hx stroke, CKD, PAF on eliquis , gout and polysubstance abuse (ETOH, cigar, marijuana). Admitted with acute HFpEF   Assessment/Plan  Acute HFmrEF - Echo 8/25: EF 45-50%, severe LVH, abnormal RV free wall strain, ~17%, RV mildly reduced, mildly elevated PASP. LA mod dilated, RA mildly dilated, small pericardial effusion present, trivial MR, mild TR, tricuspid valve abnormally thickened, aortic valve regurgitation trivial.  - Need to r/o iCM. HTN CM (though he reports normal BP readings at home consistently), ETOH induced CM? , tachy mediated (though rate-controlled a fib here)? Infiltrative heart disease? (Pending further w/u). Familial? (Father with CHF) - NYHA IIIb on admission - RHC with elevated filling pressures and severely reduced index.  - Continue IV lasix  80 mg twice a day.  - GDMT limited with AKI, consider SGLT2i tomorrow  - - Plan LHC if there is renal recovery - cMRI results pending.    Concern for cardiac amyloid - PYP 8/25 not suggestive of cardiac amyloid.  - low-voltage EKG - Does not report carpal tunnel or frequent back pain - Severe LVH on echo this admission - cMRI today - Consider cardiac biopsy - MMP pending   PAF - Suspect cause of stroke back in 5/24 - Eliquis  stopped yesterday? - Start hep gtt - rate controlled on tele - Consider DCCV, +/- TEE if noncompliant with eliquis  at home   HTN - Reports compliance with BP meds at home with controlled BP - BP stable  - Continue current regimen.  - Consider bidil    AKI on CKD stage IIIa - Baseline SCr ~ 1.5 - Up to 3.16-->2.8 - avoid hypotension   ETOH abuse Marijuana abuse - Drinks 1 pint of tequila/day. Smokes marijuana daily - Monitor for signs of withdrawal, does not appear to need CIWA at this time -  Cessation advised      Length of Stay: 4  Ryu Cerreta, NP  03/16/2024, 12:36 PM  Advanced Heart Failure Team Pager [phone] (M-F; 7a - 5p)  Please contact [facility] for night-coverage after hours (5p -7a ) and weekends on [email or web address]

## 2024-03-17 ENCOUNTER — Encounter (HOSPITAL_COMMUNITY): Payer: Self-pay | Admitting: Cardiology

## 2024-03-17 DIAGNOSIS — I5033 Acute on chronic diastolic (congestive) heart failure: Secondary | ICD-10-CM | POA: Diagnosis not present

## 2024-03-17 DIAGNOSIS — I5021 Acute systolic (congestive) heart failure: Secondary | ICD-10-CM | POA: Diagnosis not present

## 2024-03-17 LAB — CBC
HCT: 41.5 % (ref 39.0–52.0)
Hemoglobin: 13.2 g/dL (ref 13.0–17.0)
MCH: 29.3 pg (ref 26.0–34.0)
MCHC: 31.8 g/dL (ref 30.0–36.0)
MCV: 92.2 fL (ref 80.0–100.0)
Platelets: 333 K/uL (ref 150–400)
RBC: 4.5 MIL/uL (ref 4.22–5.81)
RDW: 13.8 % (ref 11.5–15.5)
WBC: 4.9 K/uL (ref 4.0–10.5)
nRBC: 0 % (ref 0.0–0.2)

## 2024-03-17 LAB — BASIC METABOLIC PANEL WITH GFR
Anion gap: 12 (ref 5–15)
BUN: 20 mg/dL (ref 6–20)
CO2: 32 mmol/L (ref 22–32)
Calcium: 9.5 mg/dL (ref 8.9–10.3)
Chloride: 96 mmol/L — ABNORMAL LOW (ref 98–111)
Creatinine, Ser: 3.05 mg/dL — ABNORMAL HIGH (ref 0.61–1.24)
GFR, Estimated: 23 mL/min — ABNORMAL LOW (ref >60)
Glucose, Bld: 113 mg/dL — ABNORMAL HIGH (ref 70–99)
Potassium: 4.1 mmol/L (ref 3.5–5.1)
Sodium: 140 mmol/L (ref 135–145)

## 2024-03-17 LAB — GLUCOSE, CAPILLARY
Glucose-Capillary: 120 mg/dL — ABNORMAL HIGH (ref 70–99)
Glucose-Capillary: 127 mg/dL — ABNORMAL HIGH (ref 70–99)
Glucose-Capillary: 177 mg/dL — ABNORMAL HIGH (ref 70–99)
Glucose-Capillary: 235 mg/dL — ABNORMAL HIGH (ref 70–99)

## 2024-03-17 NOTE — Progress Notes (Signed)
 Advanced Heart Failure Rounding Note  Cardiologist: None  Chief Complaint: Heart Failure  Subjective:    S/P Cath with endomyocardial biopsy. Elevated filling pressures, severely reduced index.   cMRI with LVEF 43%, GHK, mod concentric hypertrophy, LVEF 47%, normal L/R atrial size, severe dilatation of PA, 35mm. LGE pattern consistent with cardiac amyloidosis.   Feels better this morning. Sister at bedside.   Objective:   Weight Range: 104.1 kg Body mass index is 27.2 kg/m.   Vital Signs:   Temp:  [98 F (36.7 C)-98.7 F (37.1 C)] 98.3 F (36.8 C) (09/24 0844) Pulse Rate:  [60-75] 65 (09/24 0844) Resp:  [16-18] 16 (09/24 0844) BP: (99-118)/(63-85) 99/65 (09/24 0927) SpO2:  [99 %-100 %] 100 % (09/24 0538) Weight:  [104.1 kg] 104.1 kg (09/24 0538) Last BM Date : 03/16/24  Weight change: Filed Weights   03/15/24 0500 03/16/24 0451 03/17/24 0538  Weight: 111.5 kg 107.3 kg 104.1 kg    Intake/Output:   Intake/Output Summary (Last 24 hours) at 03/17/2024 0953 Last data filed at 03/17/2024 0936 Gross per 24 hour  Intake 289.12 ml  Output --  Net 289.12 ml    Physical Exam  General:  well appearing.  No respiratory difficulty Neck: JVD flat.  Cor: Regular rate & irregular rhythm. No murmurs. Lungs: clear Extremities: no edema  Neuro: alert & oriented x 3. Affect pleasant.   Telemetry   A fib 50s-60s (Personally reviewed)    Labs    CBC Recent Labs    03/16/24 0436 03/16/24 0806 03/17/24 0556  WBC 4.6  --  4.9  HGB 12.1* 13.3  13.6 13.2  HCT 38.0* 39.0  40.0 41.5  MCV 91.8  --  92.2  PLT 298  --  333   Basic Metabolic Panel Recent Labs    90/76/74 0436 03/16/24 0806 03/16/24 0830 03/17/24 0556  NA 141 141  141  --  140  K 3.7 3.9  4.0  --  4.1  CL 95*  --   --  96*  CO2 32  --   --  32  GLUCOSE 109*  --   --  113*  BUN 22*  --   --  20  CREATININE 2.79*  --   --  3.05*  CALCIUM  9.5  --   --  9.5  MG  --   --  1.8  --    Liver  Function Tests No results for input(s): AST, ALT, ALKPHOS, BILITOT, PROT, ALBUMIN in the last 72 hours. No results for input(s): LIPASE, AMYLASE in the last 72 hours. Cardiac Enzymes No results for input(s): CKTOTAL, CKMB, CKMBINDEX, TROPONINI in the last 72 hours.  BNP: BNP (last 3 results) No results for input(s): BNP in the last 8760 hours.  ProBNP (last 3 results) Recent Labs    03/12/24 0942  PROBNP 5,364.0*     D-Dimer No results for input(s): DDIMER in the last 72 hours. Hemoglobin A1C No results for input(s): HGBA1C in the last 72 hours. Fasting Lipid Panel No results for input(s): CHOL, HDL, LDLCALC, TRIG, CHOLHDL, LDLDIRECT in the last 72 hours. Thyroid  Function Tests No results for input(s): TSH, T4TOTAL, T3FREE, THYROIDAB in the last 72 hours.  Invalid input(s): FREET3  Other results:  Imaging    MR CARDIAC MORPHOLOGY W WO CONTRAST Result Date: 03/16/2024 CLINICAL DATA:  Clinical question of infiltrative cardiomyopathy Study assumes HCT of 38 and BSA of 2.41 m2. EXAM: CARDIAC MRI TECHNIQUE: The patient was scanned on  a 1.5 Tesla GE magnet. A dedicated cardiac coil was used. Functional imaging was done using Fiesta sequences. 2,3, and 4 chamber views were done to assess for RWMA's. Modified Simpson's rule using was used to calculate an ejection fraction on a dedicated work [employer]. The patient received 10 cc of Gadavist . After 10 minutes inversion recovery sequences were used to assess for infiltration and scar tissue. Flow quantification was performed 2 times during this examination with flow quantification performed at the levels of the ascending aorta above the valve, pulmonary artery above the valve. CONTRAST:  10 cc  of Gadavist  FINDINGS: 1. Normal left ventricular size, with LVEDD 53 mm, and LVEDVi 77 mL/m2. Moderate concentric hypertrophy, with intraventricular septal thickness of 14 mm,  posterior wall thickness of 13 mm, and myocardial mass index of 90 g/m2, severely elevated. Mild decrease in left ventricular systolic function (LVEF =43%). Global hypokinesis. Left ventricular parametric mapping notable for normal T2. ECV elevated, 36%. There is late gadolinium enhancement in the left ventricular myocardium- diffuse patchy LGE throughout myocardium with difficulty nulling myocardium. Normal resting perfusion. 2.  Dilated right ventricular size with RVEDVI 100 mL/m2. Mild increase in thickness of the RV free wall, 5 mm. Low normal right ventricular systolic function (RVEF =47%). There are no regional wall motion abnormalities or aneurysms. 3.  Normal left and right atrial size. 4. Normal size of the aortic root, ascending aorta. Severe dilation of pulmonary artery, 35 mm. 5. Valve assessment: Aortic Valve: Valve morphology is not well visualized. Qualitatively, there is no significant regurgitation. Regurgitant fraction < 1%. Mean gradient 1 mm Hg. Pulmonic Valve: Qualitatively, there is no significant regurgitation. Regurgitant fraction 4%. Tricuspid Valve: Valve morphology is tri-leaflet. Qualitatively, there is no significant regurgitation. Mitral Valve: Valve morphology is notable for relative posterior restriction. Qualitatively, there is mild regurgitation. Regurgitant fraction 13%. 6.  Normal pericardium.  Trivial RV anterior pericardial effusion. 7. Grossly, a right sided pleural effusion is noted. Recommended dedicated study if concerned for non-cardiac pathology. IMPRESSION: 1. Mild decrease in left ventricular systolic function (LVEF =43%). Myocardial mass index of 90 g/m2, severely elevated with LGE pattern and parametric mapping consistent with cardiac amyloidosis. 2. Low normal right ventricular systolic function (RVEF =47%). Mild increase in thickness, consistent with cardiac amyloidosis. Dilation of the pulmonary artery with RV dilation by volumetric assessment. Results discussed  with primary team. Stanly Leavens MD Electronically Signed   By: Stanly Leavens M.D.   On: 03/16/2024 16:12   MR CARDIAC VELOCITY FLOW MAP Result Date: 03/16/2024 CLINICAL DATA:  Clinical question of infiltrative cardiomyopathy Study assumes HCT of 38 and BSA of 2.41 m2. EXAM: CARDIAC MRI TECHNIQUE: The patient was scanned on a 1.5 Tesla GE magnet. A dedicated cardiac coil was used. Functional imaging was done using Fiesta sequences. 2,3, and 4 chamber views were done to assess for RWMA's. Modified Simpson's rule using was used to calculate an ejection fraction on a dedicated work [employer] officer, [employer]. The patient received 10 cc of Gadavist . After 10 minutes inversion recovery sequences were used to assess for infiltration and scar tissue. Flow quantification was performed 2 times during this examination with flow quantification performed at the levels of the ascending aorta above the valve, pulmonary artery above the valve. CONTRAST:  10 cc  of Gadavist  FINDINGS: 1. Normal left ventricular size, with LVEDD 53 mm, and LVEDVi 77 mL/m2. Moderate concentric hypertrophy, with intraventricular septal thickness of 14 mm, posterior wall thickness of 13 mm, and myocardial  mass index of 90 g/m2, severely elevated. Mild decrease in left ventricular systolic function (LVEF =43%). Global hypokinesis. Left ventricular parametric mapping notable for normal T2. ECV elevated, 36%. There is late gadolinium enhancement in the left ventricular myocardium- diffuse patchy LGE throughout myocardium with difficulty nulling myocardium. Normal resting perfusion. 2.  Dilated right ventricular size with RVEDVI 100 mL/m2. Mild increase in thickness of the RV free wall, 5 mm. Low normal right ventricular systolic function (RVEF =47%). There are no regional wall motion abnormalities or aneurysms. 3.  Normal left and right atrial size. 4. Normal size of the aortic root, ascending aorta. Severe dilation of pulmonary  artery, 35 mm. 5. Valve assessment: Aortic Valve: Valve morphology is not well visualized. Qualitatively, there is no significant regurgitation. Regurgitant fraction < 1%. Mean gradient 1 mm Hg. Pulmonic Valve: Qualitatively, there is no significant regurgitation. Regurgitant fraction 4%. Tricuspid Valve: Valve morphology is tri-leaflet. Qualitatively, there is no significant regurgitation. Mitral Valve: Valve morphology is notable for relative posterior restriction. Qualitatively, there is mild regurgitation. Regurgitant fraction 13%. 6.  Normal pericardium.  Trivial RV anterior pericardial effusion. 7. Grossly, a right sided pleural effusion is noted. Recommended dedicated study if concerned for non-cardiac pathology. IMPRESSION: 1. Mild decrease in left ventricular systolic function (LVEF =43%). Myocardial mass index of 90 g/m2, severely elevated with LGE pattern and parametric mapping consistent with cardiac amyloidosis. 2. Low normal right ventricular systolic function (RVEF =47%). Mild increase in thickness, consistent with cardiac amyloidosis. Dilation of the pulmonary artery with RV dilation by volumetric assessment. Results discussed with primary team. Stanly Leavens MD Electronically Signed   By: Stanly Leavens M.D.   On: 03/16/2024 16:12   MR CARDIAC VELOCITY FLOW MAP Result Date: 03/16/2024 CLINICAL DATA:  Clinical question of infiltrative cardiomyopathy Study assumes HCT of 38 and BSA of 2.41 m2. EXAM: CARDIAC MRI TECHNIQUE: The patient was scanned on a 1.5 Tesla GE magnet. A dedicated cardiac coil was used. Functional imaging was done using Fiesta sequences. 2,3, and 4 chamber views were done to assess for RWMA's. Modified Simpson's rule using was used to calculate an ejection fraction on a dedicated work [employer] officer, [employer]. The patient received 10 cc of Gadavist . After 10 minutes inversion recovery sequences were used to assess for infiltration and scar tissue. Flow  quantification was performed 2 times during this examination with flow quantification performed at the levels of the ascending aorta above the valve, pulmonary artery above the valve. CONTRAST:  10 cc  of Gadavist  FINDINGS: 1. Normal left ventricular size, with LVEDD 53 mm, and LVEDVi 77 mL/m2. Moderate concentric hypertrophy, with intraventricular septal thickness of 14 mm, posterior wall thickness of 13 mm, and myocardial mass index of 90 g/m2, severely elevated. Mild decrease in left ventricular systolic function (LVEF =43%). Global hypokinesis. Left ventricular parametric mapping notable for normal T2. ECV elevated, 36%. There is late gadolinium enhancement in the left ventricular myocardium- diffuse patchy LGE throughout myocardium with difficulty nulling myocardium. Normal resting perfusion. 2.  Dilated right ventricular size with RVEDVI 100 mL/m2. Mild increase in thickness of the RV free wall, 5 mm. Low normal right ventricular systolic function (RVEF =47%). There are no regional wall motion abnormalities or aneurysms. 3.  Normal left and right atrial size. 4. Normal size of the aortic root, ascending aorta. Severe dilation of pulmonary artery, 35 mm. 5. Valve assessment: Aortic Valve: Valve morphology is not well visualized. Qualitatively, there is no significant regurgitation. Regurgitant fraction < 1%. Mean gradient 1  mm Hg. Pulmonic Valve: Qualitatively, there is no significant regurgitation. Regurgitant fraction 4%. Tricuspid Valve: Valve morphology is tri-leaflet. Qualitatively, there is no significant regurgitation. Mitral Valve: Valve morphology is notable for relative posterior restriction. Qualitatively, there is mild regurgitation. Regurgitant fraction 13%. 6.  Normal pericardium.  Trivial RV anterior pericardial effusion. 7. Grossly, a right sided pleural effusion is noted. Recommended dedicated study if concerned for non-cardiac pathology. IMPRESSION: 1. Mild decrease in left ventricular  systolic function (LVEF =43%). Myocardial mass index of 90 g/m2, severely elevated with LGE pattern and parametric mapping consistent with cardiac amyloidosis. 2. Low normal right ventricular systolic function (RVEF =47%). Mild increase in thickness, consistent with cardiac amyloidosis. Dilation of the pulmonary artery with RV dilation by volumetric assessment. Results discussed with primary team. Stanly Leavens MD Electronically Signed   By: Stanly Leavens M.D.   On: 03/16/2024 16:12     Medications:     Scheduled Medications:  allopurinol   300 mg Oral Daily   amLODipine   5 mg Oral Daily   apixaban   5 mg Oral BID   bisoprolol   20 mg Oral q AM   buPROPion   300 mg Oral Daily   empagliflozin   10 mg Oral Daily   folic acid   1 mg Oral Daily   furosemide   80 mg Intravenous BID   multivitamin with minerals  1 tablet Oral Daily   nicotine   7 mg Transdermal Daily   pantoprazole   40 mg Oral Daily   potassium chloride   40 mEq Oral BID   rosuvastatin   20 mg Oral Daily   sodium chloride  flush  3 mL Intravenous Q12H   thiamine   100 mg Oral Daily   Or   thiamine   100 mg Intravenous Daily    Infusions:    PRN Medications: acetaminophen  **OR** acetaminophen , albuterol , polyethylene glycol    Patient Profile   Keith Leblanc is a 58 y.o. AAM with DMII, HTN, HLD, hx stroke, CKD, PAF on eliquis , gout and polysubstance abuse (ETOH, cigar, marijuana). Admitted with acute HFpEF   Assessment/Plan  Acute HFmrEF - Echo 8/25: EF 45-50%, severe LVH, abnormal RV free wall strain, ~17%, RV mildly reduced, mildly elevated PASP. LA mod dilated, RA mildly dilated, small pericardial effusion present, trivial MR, mild TR, tricuspid valve abnormally thickened, aortic valve regurgitation trivial.  - Need to r/o iCM. HTN CM (though he reports normal BP readings at home consistently), ETOH induced CM? , tachy mediated (though rate-controlled a fib here)? Infiltrative heart disease? (Pending  further w/u). Familial? (Father with CHF) - NYHA IIIb on admission - RHC with elevated filling pressures and severely reduced index.  - Appears euvolemic on exam and SCr now rising. Stop further IV diuresis. May transition to PO tomorrow.  - Continue Jardiance  10 mg daily - GDMT limited with renal function - Plan LHC if there is renal recovery - cMRI with LVEF 43%, GHK, mod concentric hypertrophy, LVEF 47%, normal L/R atrial size, severe dilatation of PA, 35mm. LGE pattern consistent with cardiac amyloidosis.  - Genetic testing OP  - Discussed importance of quitting ETOH, smoking cigarettes and marijuana. May need heart tx down the road.    Concern for cardiac amyloid - PYP 8/25 not suggestive of cardiac amyloid.  - low-voltage EKG - Does not report carpal tunnel or frequent back pain - Severe LVH on echo this admission - cMRI consistent with cardiac amyloidosis - Check K/L chains and UIFE - MMP pending - Biopsy results pending   PAF - Suspect cause  of stroke back in 5/24 - Continue Eliquis  5 mg BID - rate controlled on tele - Consider DCCV, +/- TEE if noncompliant with eliquis  at home   HTN - Reports compliance with BP meds at home with controlled BP - Continue current regimen.  - Consider bidil, BP low/stable. No need at this time   AKI on CKD stage IIIa - Baseline SCr ~ 1.5 - Up to 3.16-->2.8 > 3.05 - Holding diuretics - avoid hypotension   ETOH abuse Marijuana abuse - Drinks 1 pint of tequila/day. Smokes marijuana daily - Monitor for signs of withdrawal, does not appear to need CIWA at this time - Cessation advised    Length of Stay: 5  Beckey LITTIE Coe, NP  03/17/2024, 9:53 AM  Advanced Heart Failure Team Pager [phone] (M-F; 7a - 5p)  Please contact [facility] for night-coverage after hours (5p -7a ) and weekends on [email or web address]

## 2024-03-17 NOTE — Progress Notes (Addendum)
 PROGRESS NOTE    Keith Leblanc  FMW:[id number] DOB: 1966/04/30 DOA: 03/12/2024 PCP: [name], NP  58/M with history of hypertension, type 2 diabetes mellitus, paroxysmal A-fib on Eliquis , CVA, gout, polysubstance abuse including alcohol, cannabis and cigar admitted with acute diastolic CHF, AKI on CKD 3a workup concerning for cardiac amyloidosis, - Echo with EF 45-50%, severe LVH - RHC noted elevated filling pressures, severely reduced cardiac index  Subjective: -Feels better, denies any orthopnea or dyspnea, ambulated in the hall yesterday  Assessment and Plan:  Acute systolic CHF, severe LVH Concern for cardiac amyloidosis -Echo with EF 45-50%, severe LVH, abnormal RV free wall strain, mildly elevated PASP - EtOH could be contributing, Afib as well -Cardiac MRI concerning for amyloidosis -RHC/endomyocardial biopsy with elevated filling pressures, severely reduced cardiac index, ? Stop bisoprolol  - Improved with diuresis, weight down 40 LB - GDMT limited by AKI/CKD - Continue Jardiance , holding further diuretics today  AKI on CKD 3a - Baseline creatinine around 1.5,, 2.5 on admission, peaked at 3.16, now plateauing, avoid hypotension - Will need nephrology follow-up  Paroxysmal A-fib - Rate controlled, continue Eliquis  -?DCCV  Polysubstance abuse, EtOH, marijuana - Counseled - No withdrawal noted  Hypertension Meds as above  DVT prophylaxis: Apixaban  Code Status: Full code Family Communication: None present Disposition Plan:   Consultants:    Procedures:   Antimicrobials:    Objective: Vitals:   03/17/24 0538 03/17/24 0736 03/17/24 0844 03/17/24 0927  BP: 103/73  104/73 99/65  Pulse: 68 63 65   Resp: 18 16 16    Temp: 98 F (36.7 C) 98.1 F (36.7 C) 98.3 F (36.8 C)   TempSrc: Oral Oral Oral   SpO2: 100%     Weight: 104.1 kg     Height:        Intake/Output Summary (Last 24 hours) at 03/17/2024 1107 Last data filed at 03/17/2024 0936 Gross  per 24 hour  Intake 289.12 ml  Output --  Net 289.12 ml   Filed Weights   03/15/24 0500 03/16/24 0451 03/17/24 0538  Weight: 111.5 kg 107.3 kg 104.1 kg    Examination:  General exam: Appears calm and comfortable  HEENT: no JVD Respiratory system: Clear to auscultation Cardiovascular system: S1 & S2 heard, irregular rhythm Abd: nondistended, soft and nontender.Normal bowel sounds heard. Central nervous system: Alert and oriented. No focal neurological deficits. Extremities: no edema Skin: No rashes Psychiatry:  Mood & affect appropriate.     Data Reviewed:   CBC: Recent Labs  Lab 03/13/24 0418 03/14/24 0338 03/15/24 0546 03/16/24 0436 03/16/24 0806 03/17/24 0556  WBC 4.8 5.0 4.5 4.6  --  4.9  HGB 11.2* 11.1* 11.4* 12.1* 13.3  13.6 13.2  HCT 34.5* 34.8* 36.1* 38.0* 39.0  40.0 41.5  MCV 94.5 92.1 92.8 91.8  --  92.2  PLT 264 258 277 298  --  333   Basic Metabolic Panel: Recent Labs  Lab 03/12/24 0942 03/13/24 0418 03/14/24 0338 03/15/24 0546 03/16/24 0436 03/16/24 0806 03/16/24 0830 03/17/24 0556  NA 144 142 141 142 141 141  141  --  140  K 4.1 3.2* 3.5 3.3* 3.7 3.9  4.0  --  4.1  CL 105 103 99 97* 95*  --   --  96*  CO2 22 26 30  33* 32  --   --  32  GLUCOSE 77 53* 109* 113* 109*  --   --  113*  BUN 25* 24* 23* 21* 22*  --   --  20  CREATININE 2.53* 2.63* 3.16* 3.08* 2.79*  --   --  3.05*  CALCIUM  9.7 9.3 9.3 9.3 9.5  --   --  9.5  MG 1.6* 1.7  --   --   --   --  1.8  --    GFR: Estimated Creatinine Clearance: 34.1 mL/min (A) (by C-G formula based on SCr of 3.05 mg/dL (H)). Liver Function Tests: No results for input(s): AST, ALT, ALKPHOS, BILITOT, PROT, ALBUMIN in the last 168 hours. No results for input(s): LIPASE, AMYLASE in the last 168 hours. No results for input(s): AMMONIA in the last 168 hours. Coagulation Profile: No results for input(s): INR, PROTIME in the last 168 hours. Cardiac Enzymes: No results for input(s):  CKTOTAL, CKMB, CKMBINDEX, TROPONINI in the last 168 hours. BNP (last 3 results) Recent Labs    03/12/24 0942  PROBNP 5,364.0*   HbA1C: No results for input(s): HGBA1C in the last 72 hours. CBG: Recent Labs  Lab 03/15/24 2106 03/16/24 0846 03/16/24 1621 03/16/24 2101 03/17/24 0733  GLUCAP 173* 117* 117* 144* 120*   Lipid Profile: No results for input(s): CHOL, HDL, LDLCALC, TRIG, CHOLHDL, LDLDIRECT in the last 72 hours. Thyroid  Function Tests: No results for input(s): TSH, T4TOTAL, FREET4, T3FREE, THYROIDAB in the last 72 hours. Anemia Panel: No results for input(s): VITAMINB12, FOLATE, FERRITIN, TIBC, IRON, RETICCTPCT in the last 72 hours. Urine analysis:    Component Value Date/Time   COLORURINE AMBER (A) 06/18/2018 1445   APPEARANCEUR CLOUDY (A) 06/18/2018 1445   LABSPEC 1.029 06/18/2018 1445   PHURINE 5.0 06/18/2018 1445   GLUCOSEU >=500 (A) 06/18/2018 1445   HGBUR NEGATIVE 06/18/2018 1445   BILIRUBINUR NEGATIVE 06/18/2018 1445   KETONESUR 20 (A) 06/18/2018 1445   PROTEINUR NEGATIVE 06/18/2018 1445   NITRITE NEGATIVE 06/18/2018 1445   LEUKOCYTESUR NEGATIVE 06/18/2018 1445   Sepsis Labs: @LABRCNTIP (procalcitonin:4,lacticidven:4)  ) Recent Results (from the past 240 hours)  Resp panel by RT-PCR (RSV, Flu A&B, Covid) Anterior Nasal Swab     Status: None   Collection Time: 03/12/24  9:17 AM   Specimen: Anterior Nasal Swab  Result Value Ref Range Status   SARS Coronavirus 2 by RT PCR NEGATIVE NEGATIVE Final    Comment: (NOTE) SARS-CoV-2 target nucleic acids are NOT DETECTED.  The SARS-CoV-2 RNA is generally detectable in upper respiratory specimens during the acute phase of infection. The lowest concentration of SARS-CoV-2 viral copies this assay can detect is 138 copies/mL. A negative result does not preclude SARS-Cov-2 infection and should not be used as the sole basis for treatment or other patient management  decisions. A negative result may occur with  improper specimen collection/handling, submission of specimen other than nasopharyngeal swab, presence of viral mutation(s) within the areas targeted by this assay, and inadequate number of viral copies(<138 copies/mL). A negative result must be combined with clinical observations, patient history, and epidemiological information. The expected result is Negative.  Fact Sheet for Patients:  BloggerCourse.com  Fact Sheet for Healthcare Providers:  SeriousBroker.it  This test is no t yet approved or cleared by the [place]  FDA and  has been authorized for detection and/or diagnosis of SARS-CoV-2 by FDA under an Emergency Use Authorization (EUA). This EUA will remain  in effect (meaning this test can be used) for the duration of the COVID-19 declaration under Section 564(b)(1) of the Act, 21 U.S.C.section 360bbb-3(b)(1), unless the authorization is terminated  or revoked sooner.       Influenza A by PCR NEGATIVE NEGATIVE  Final   Influenza B by PCR NEGATIVE NEGATIVE Final    Comment: (NOTE) The Xpert Xpress SARS-CoV-2/FLU/RSV plus assay is intended as an aid in the diagnosis of influenza from Nasopharyngeal swab specimens and should not be used as a sole basis for treatment. Nasal washings and aspirates are unacceptable for Xpert Xpress SARS-CoV-2/FLU/RSV testing.  Fact Sheet for Patients: BloggerCourse.com  Fact Sheet for Healthcare Providers: SeriousBroker.it  This test is not yet approved or cleared by the [place]  FDA and has been authorized for detection and/or diagnosis of SARS-CoV-2 by FDA under an Emergency Use Authorization (EUA). This EUA will remain in effect (meaning this test can be used) for the duration of the COVID-19 declaration under Section 564(b)(1) of the Act, 21 U.S.C. section 360bbb-3(b)(1), unless the  authorization is terminated or revoked.     Resp Syncytial Virus by PCR NEGATIVE NEGATIVE Final    Comment: (NOTE) Fact Sheet for Patients: BloggerCourse.com  Fact Sheet for Healthcare Providers: SeriousBroker.it  This test is not yet approved or cleared by the [place]  FDA and has been authorized for detection and/or diagnosis of SARS-CoV-2 by FDA under an Emergency Use Authorization (EUA). This EUA will remain in effect (meaning this test can be used) for the duration of the COVID-19 declaration under Section 564(b)(1) of the Act, 21 U.S.C. section 360bbb-3(b)(1), unless the authorization is terminated or revoked.  Performed at [facility], [address], [place], [place] [address]      Radiology Studies: ECHOCARDIOGRAM LIMITED Result Date: 03/16/2024    ECHOCARDIOGRAM LIMITED REPORT   Patient Name:   SPENSER HARREN Date of Exam: 03/16/2024 Medical Rec #:  [id number]       Height:       77.0 in Accession #:    [id number]      Weight:       236.6 lb Date of Birth:  1966-03-30       BSA:          2.402 m Patient Age:    58 years        BP:           106/78 mmHg Patient Gender: M               HR:           60 bpm. Exam Location:  Inpatient Procedure: Limited Color Doppler and Limited Echo (Both Spectral and Color Flow            Doppler were utilized during procedure). Indications:    Cardiac Sarcoidosis  History:        Patient has prior history of Echocardiogram examinations, most                 recent 01/27/2024. CHF, Stroke and CKD, Arrythmias:Atrial                 Fibrillation; Risk Factors:Hypertension, Diabetes and                 Dyslipidemia.  Sonographer:    Thea Norlander RCS Referring Phys: [id number] BENJAMIN J STONER IMPRESSIONS  1. Left ventricular ejection fraction, by estimation, is 55 to 60%. The left ventricle has normal function.  2. Right ventricular systolic function is low normal.  3. Echo  performed as a part of biopsy. There is no evidence of significant pericardial effusion after procedural intervention. FINDINGS  Left Ventricle: Left ventricular ejection fraction, by estimation, is 55 to 60%. The left ventricle has normal function. Right  Ventricle: Right ventricular systolic function is low normal. Tricuspid Valve: The tricuspid valve is normal in structure. Tricuspid valve regurgitation is trivial. No evidence of tricuspid stenosis. Stanly Leavens MD Electronically signed by Stanly Leavens MD Signature Date/Time: 03/16/2024/6:00:48 PM    Final    MR CARDIAC MORPHOLOGY W WO CONTRAST Result Date: 03/16/2024 CLINICAL DATA:  Clinical question of infiltrative cardiomyopathy Study assumes HCT of 38 and BSA of 2.41 m2. EXAM: CARDIAC MRI TECHNIQUE: The patient was scanned on a 1.5 Tesla GE magnet. A dedicated cardiac coil was used. Functional imaging was done using Fiesta sequences. 2,3, and 4 chamber views were done to assess for RWMA's. Modified Simpson's rule using was used to calculate an ejection fraction on a dedicated work [employer], [employer]. The patient received 10 cc of Gadavist . After 10 minutes inversion recovery sequences were used to assess for infiltration and scar tissue. Flow quantification was performed 2 times during this examination with flow quantification performed at the levels of the ascending aorta above the valve, pulmonary artery above the valve. CONTRAST:  10 cc  of Gadavist  FINDINGS: 1. Normal left ventricular size, with LVEDD 53 mm, and LVEDVi 77 mL/m2. Moderate concentric hypertrophy, with intraventricular septal thickness of 14 mm, posterior wall thickness of 13 mm, and myocardial mass index of 90 g/m2, severely elevated. Mild decrease in left ventricular systolic function (LVEF =43%). Global hypokinesis. Left ventricular parametric mapping notable for normal T2. ECV elevated, 36%. There is late gadolinium enhancement in the left ventricular  myocardium- diffuse patchy LGE throughout myocardium with difficulty nulling myocardium. Normal resting perfusion. 2.  Dilated right ventricular size with RVEDVI 100 mL/m2. Mild increase in thickness of the RV free wall, 5 mm. Low normal right ventricular systolic function (RVEF =47%). There are no regional wall motion abnormalities or aneurysms. 3.  Normal left and right atrial size. 4. Normal size of the aortic root, ascending aorta. Severe dilation of pulmonary artery, 35 mm. 5. Valve assessment: Aortic Valve: Valve morphology is not well visualized. Qualitatively, there is no significant regurgitation. Regurgitant fraction < 1%. Mean gradient 1 mm Hg. Pulmonic Valve: Qualitatively, there is no significant regurgitation. Regurgitant fraction 4%. Tricuspid Valve: Valve morphology is tri-leaflet. Qualitatively, there is no significant regurgitation. Mitral Valve: Valve morphology is notable for relative posterior restriction. Qualitatively, there is mild regurgitation. Regurgitant fraction 13%. 6.  Normal pericardium.  Trivial RV anterior pericardial effusion. 7. Grossly, a right sided pleural effusion is noted. Recommended dedicated study if concerned for non-cardiac pathology. IMPRESSION: 1. Mild decrease in left ventricular systolic function (LVEF =43%). Myocardial mass index of 90 g/m2, severely elevated with LGE pattern and parametric mapping consistent with cardiac amyloidosis. 2. Low normal right ventricular systolic function (RVEF =47%). Mild increase in thickness, consistent with cardiac amyloidosis. Dilation of the pulmonary artery with RV dilation by volumetric assessment. Results discussed with primary team. Stanly Leavens MD Electronically Signed   By: Stanly Leavens M.D.   On: 03/16/2024 16:12   MR CARDIAC VELOCITY FLOW MAP Result Date: 03/16/2024 CLINICAL DATA:  Clinical question of infiltrative cardiomyopathy Study assumes HCT of 38 and BSA of 2.41 m2. EXAM: CARDIAC MRI TECHNIQUE:  The patient was scanned on a 1.5 Tesla GE magnet. A dedicated cardiac coil was used. Functional imaging was done using Fiesta sequences. 2,3, and 4 chamber views were done to assess for RWMA's. Modified Simpson's rule using was used to calculate an ejection fraction on a dedicated work [employer] officer, [employer]. The patient received 10 cc of  Gadavist . After 10 minutes inversion recovery sequences were used to assess for infiltration and scar tissue. Flow quantification was performed 2 times during this examination with flow quantification performed at the levels of the ascending aorta above the valve, pulmonary artery above the valve. CONTRAST:  10 cc  of Gadavist  FINDINGS: 1. Normal left ventricular size, with LVEDD 53 mm, and LVEDVi 77 mL/m2. Moderate concentric hypertrophy, with intraventricular septal thickness of 14 mm, posterior wall thickness of 13 mm, and myocardial mass index of 90 g/m2, severely elevated. Mild decrease in left ventricular systolic function (LVEF =43%). Global hypokinesis. Left ventricular parametric mapping notable for normal T2. ECV elevated, 36%. There is late gadolinium enhancement in the left ventricular myocardium- diffuse patchy LGE throughout myocardium with difficulty nulling myocardium. Normal resting perfusion. 2.  Dilated right ventricular size with RVEDVI 100 mL/m2. Mild increase in thickness of the RV free wall, 5 mm. Low normal right ventricular systolic function (RVEF =47%). There are no regional wall motion abnormalities or aneurysms. 3.  Normal left and right atrial size. 4. Normal size of the aortic root, ascending aorta. Severe dilation of pulmonary artery, 35 mm. 5. Valve assessment: Aortic Valve: Valve morphology is not well visualized. Qualitatively, there is no significant regurgitation. Regurgitant fraction < 1%. Mean gradient 1 mm Hg. Pulmonic Valve: Qualitatively, there is no significant regurgitation. Regurgitant fraction 4%. Tricuspid Valve: Valve morphology  is tri-leaflet. Qualitatively, there is no significant regurgitation. Mitral Valve: Valve morphology is notable for relative posterior restriction. Qualitatively, there is mild regurgitation. Regurgitant fraction 13%. 6.  Normal pericardium.  Trivial RV anterior pericardial effusion. 7. Grossly, a right sided pleural effusion is noted. Recommended dedicated study if concerned for non-cardiac pathology. IMPRESSION: 1. Mild decrease in left ventricular systolic function (LVEF =43%). Myocardial mass index of 90 g/m2, severely elevated with LGE pattern and parametric mapping consistent with cardiac amyloidosis. 2. Low normal right ventricular systolic function (RVEF =47%). Mild increase in thickness, consistent with cardiac amyloidosis. Dilation of the pulmonary artery with RV dilation by volumetric assessment. Results discussed with primary team. Stanly Leavens MD Electronically Signed   By: Stanly Leavens M.D.   On: 03/16/2024 16:12   MR CARDIAC VELOCITY FLOW MAP Result Date: 03/16/2024 CLINICAL DATA:  Clinical question of infiltrative cardiomyopathy Study assumes HCT of 38 and BSA of 2.41 m2. EXAM: CARDIAC MRI TECHNIQUE: The patient was scanned on a 1.5 Tesla GE magnet. A dedicated cardiac coil was used. Functional imaging was done using Fiesta sequences. 2,3, and 4 chamber views were done to assess for RWMA's. Modified Simpson's rule using was used to calculate an ejection fraction on a dedicated work [employer] officer, [employer]. The patient received 10 cc of Gadavist . After 10 minutes inversion recovery sequences were used to assess for infiltration and scar tissue. Flow quantification was performed 2 times during this examination with flow quantification performed at the levels of the ascending aorta above the valve, pulmonary artery above the valve. CONTRAST:  10 cc  of Gadavist  FINDINGS: 1. Normal left ventricular size, with LVEDD 53 mm, and LVEDVi 77 mL/m2. Moderate concentric hypertrophy,  with intraventricular septal thickness of 14 mm, posterior wall thickness of 13 mm, and myocardial mass index of 90 g/m2, severely elevated. Mild decrease in left ventricular systolic function (LVEF =43%). Global hypokinesis. Left ventricular parametric mapping notable for normal T2. ECV elevated, 36%. There is late gadolinium enhancement in the left ventricular myocardium- diffuse patchy LGE throughout myocardium with difficulty nulling myocardium. Normal resting perfusion. 2.  Dilated  right ventricular size with RVEDVI 100 mL/m2. Mild increase in thickness of the RV free wall, 5 mm. Low normal right ventricular systolic function (RVEF =47%). There are no regional wall motion abnormalities or aneurysms. 3.  Normal left and right atrial size. 4. Normal size of the aortic root, ascending aorta. Severe dilation of pulmonary artery, 35 mm. 5. Valve assessment: Aortic Valve: Valve morphology is not well visualized. Qualitatively, there is no significant regurgitation. Regurgitant fraction < 1%. Mean gradient 1 mm Hg. Pulmonic Valve: Qualitatively, there is no significant regurgitation. Regurgitant fraction 4%. Tricuspid Valve: Valve morphology is tri-leaflet. Qualitatively, there is no significant regurgitation. Mitral Valve: Valve morphology is notable for relative posterior restriction. Qualitatively, there is mild regurgitation. Regurgitant fraction 13%. 6.  Normal pericardium.  Trivial RV anterior pericardial effusion. 7. Grossly, a right sided pleural effusion is noted. Recommended dedicated study if concerned for non-cardiac pathology. IMPRESSION: 1. Mild decrease in left ventricular systolic function (LVEF =43%). Myocardial mass index of 90 g/m2, severely elevated with LGE pattern and parametric mapping consistent with cardiac amyloidosis. 2. Low normal right ventricular systolic function (RVEF =47%). Mild increase in thickness, consistent with cardiac amyloidosis. Dilation of the pulmonary artery with RV  dilation by volumetric assessment. Results discussed with primary team. Stanly Leavens MD Electronically Signed   By: Stanly Leavens M.D.   On: 03/16/2024 16:12   CARDIAC CATHETERIZATION Result Date: 03/16/2024 HEMODYNAMICS: RA:       13 mmHg (mean) RV:       50/4, 13 mmHg PA:       50/22 mmHg (32 mean) PCWP: 22 mmHg (mean) with v waves to    Estimated Fick CO/CI   7.06L/min, 2.89L/min/m2 Thermodilution CO/CI   4.48L/min, 1.83L/min/m2    TPG  10  mmHg     PVR  2.23 Wood Units PAPi  2.1  IMPRESSION: Right heart catheterization and endomyocardial biopsy with 4 specimens obtained Moderately elevated biventricular filling pressures Severely reduced index by thermodilution Moderate group II PH with evidence of noncompliant left atrium Post procedure RA and echo similar to pre RECOMMENDATIONS: Await specimen results, findings communicated to HF team     Scheduled Meds:  allopurinol   300 mg Oral Daily   amLODipine   5 mg Oral Daily   apixaban   5 mg Oral BID   bisoprolol   20 mg Oral q AM   buPROPion   300 mg Oral Daily   empagliflozin   10 mg Oral Daily   folic acid   1 mg Oral Daily   multivitamin with minerals  1 tablet Oral Daily   nicotine   7 mg Transdermal Daily   pantoprazole   40 mg Oral Daily   potassium chloride   40 mEq Oral BID   rosuvastatin   20 mg Oral Daily   sodium chloride  flush  3 mL Intravenous Q12H   thiamine   100 mg Oral Daily   Or   thiamine   100 mg Intravenous Daily   Continuous Infusions:   LOS: 5 days    Time spent:    Sigurd Pac, MD [facility]   03/17/2024, 11:07 AM

## 2024-03-17 NOTE — Plan of Care (Signed)
  Problem: Education: Goal: Knowledge of General Education information will improve Description: Including pain rating scale, medication(s)/side effects and non-pharmacologic comfort measures Outcome: Progressing   Problem: Health Behavior/Discharge Planning: Goal: Ability to manage health-related needs will improve Outcome: Progressing   Problem: Clinical Measurements: Goal: Ability to maintain clinical measurements within normal limits will improve Outcome: Progressing Goal: Will remain free from infection Outcome: Progressing Goal: Diagnostic test results will improve Outcome: Progressing Goal: Respiratory complications will improve Outcome: Progressing Goal: Cardiovascular complication will be avoided Outcome: Progressing   Problem: Activity: Goal: Risk for activity intolerance will decrease Outcome: Progressing   Problem: Nutrition: Goal: Adequate nutrition will be maintained Outcome: Progressing   Problem: Coping: Goal: Level of anxiety will decrease Outcome: Progressing   Problem: Elimination: Goal: Will not experience complications related to bowel motility Outcome: Progressing Goal: Will not experience complications related to urinary retention Outcome: Progressing   Problem: Pain Managment: Goal: General experience of comfort will improve and/or be controlled Outcome: Progressing   Problem: Safety: Goal: Ability to remain free from injury will improve Outcome: Progressing   Problem: Skin Integrity: Goal: Risk for impaired skin integrity will decrease Outcome: Progressing   Problem: Activity: Goal: Capacity to carry out activities will improve Outcome: Progressing   Problem: Cardiac: Goal: Ability to achieve and maintain adequate cardiopulmonary perfusion will improve Outcome: Progressing   Problem: Education: Goal: Understanding of CV disease, CV risk reduction, and recovery process will improve Outcome: Progressing Goal: Individualized  Educational Video(s) Outcome: Progressing   Problem: Activity: Goal: Ability to return to baseline activity level will improve Outcome: Progressing   Problem: Cardiovascular: Goal: Ability to achieve and maintain adequate cardiovascular perfusion will improve Outcome: Progressing Goal: Vascular access site(s) Level 0-1 will be maintained Outcome: Progressing   Problem: Health Behavior/Discharge Planning: Goal: Ability to safely manage health-related needs after discharge will improve Outcome: Progressing

## 2024-03-18 ENCOUNTER — Other Ambulatory Visit (HOSPITAL_COMMUNITY): Payer: Self-pay | Admitting: *Deleted

## 2024-03-18 ENCOUNTER — Other Ambulatory Visit (HOSPITAL_COMMUNITY): Payer: Self-pay

## 2024-03-18 DIAGNOSIS — I5032 Chronic diastolic (congestive) heart failure: Secondary | ICD-10-CM

## 2024-03-18 DIAGNOSIS — I5021 Acute systolic (congestive) heart failure: Secondary | ICD-10-CM | POA: Diagnosis not present

## 2024-03-18 DIAGNOSIS — I5033 Acute on chronic diastolic (congestive) heart failure: Secondary | ICD-10-CM | POA: Diagnosis not present

## 2024-03-18 LAB — CBC
HCT: 38.7 % — ABNORMAL LOW (ref 39.0–52.0)
Hemoglobin: 12.3 g/dL — ABNORMAL LOW (ref 13.0–17.0)
MCH: 28.9 pg (ref 26.0–34.0)
MCHC: 31.8 g/dL (ref 30.0–36.0)
MCV: 91.1 fL (ref 80.0–100.0)
Platelets: 332 K/uL (ref 150–400)
RBC: 4.25 MIL/uL (ref 4.22–5.81)
RDW: 13.9 % (ref 11.5–15.5)
WBC: 4.7 K/uL (ref 4.0–10.5)
nRBC: 0 % (ref 0.0–0.2)

## 2024-03-18 LAB — BASIC METABOLIC PANEL WITH GFR
Anion gap: 13 (ref 5–15)
BUN: 24 mg/dL — ABNORMAL HIGH (ref 6–20)
CO2: 28 mmol/L (ref 22–32)
Calcium: 9.3 mg/dL (ref 8.9–10.3)
Chloride: 99 mmol/L (ref 98–111)
Creatinine, Ser: 2.88 mg/dL — ABNORMAL HIGH (ref 0.61–1.24)
GFR, Estimated: 25 mL/min — ABNORMAL LOW (ref >60)
Glucose, Bld: 115 mg/dL — ABNORMAL HIGH (ref 70–99)
Potassium: 4.7 mmol/L (ref 3.5–5.1)
Sodium: 140 mmol/L (ref 135–145)

## 2024-03-18 LAB — KAPPA/LAMBDA LIGHT CHAINS
Kappa free light chain: 64.9 mg/L — ABNORMAL HIGH (ref 3.3–19.4)
Kappa, lambda light chain ratio: 0.14 — ABNORMAL LOW (ref 0.26–1.65)
Lambda free light chains: 448 mg/L — ABNORMAL HIGH (ref 5.7–26.3)

## 2024-03-18 MED ORDER — POTASSIUM CHLORIDE CRYS ER 20 MEQ PO TBCR
40.0000 meq | EXTENDED_RELEASE_TABLET | Freq: Every day | ORAL | 2 refills | Status: DC | PRN
  Filled 2024-03-18: qty 30, 15d supply, fill #0

## 2024-03-18 MED ORDER — EMPAGLIFLOZIN 10 MG PO TABS
10.0000 mg | ORAL_TABLET | Freq: Every day | ORAL | 1 refills | Status: DC
  Filled 2024-03-18: qty 30, 30d supply, fill #0

## 2024-03-18 MED ORDER — FUROSEMIDE 20 MG PO TABS
40.0000 mg | ORAL_TABLET | ORAL | 0 refills | Status: DC | PRN
  Filled 2024-03-18: qty 60, 30d supply, fill #0

## 2024-03-18 MED ORDER — NICOTINE 7 MG/24HR TD PT24
7.0000 mg | MEDICATED_PATCH | Freq: Every day | TRANSDERMAL | 0 refills | Status: DC
  Filled 2024-03-18: qty 28, 28d supply, fill #0

## 2024-03-18 NOTE — Plan of Care (Signed)
  Problem: Health Behavior/Discharge Planning: Goal: Ability to manage health-related needs will improve Outcome: Adequate for Discharge   Problem: Education: Goal: Knowledge of General Education information will improve Description: Including pain rating scale, medication(s)/side effects and non-pharmacologic comfort measures Outcome: Adequate for Discharge   Problem: Clinical Measurements: Goal: Cardiovascular complication will be avoided Outcome: Adequate for Discharge   Problem: Nutrition: Goal: Adequate nutrition will be maintained Outcome: Adequate for Discharge   Problem: Coping: Goal: Level of anxiety will decrease Outcome: Adequate for Discharge   Problem: Education: Goal: Ability to demonstrate management of disease process will improve Outcome: Adequate for Discharge   Problem: Cardiac: Goal: Ability to achieve and maintain adequate cardiopulmonary perfusion will improve Outcome: Adequate for Discharge   Problem: Education: Goal: Understanding of CV disease, CV risk reduction, and recovery process will improve Outcome: Adequate for Discharge   Problem: Cardiovascular: Goal: Ability to achieve and maintain adequate cardiovascular perfusion will improve Outcome: Adequate for Discharge   Problem: Health Behavior/Discharge Planning: Goal: Ability to safely manage health-related needs after discharge will improve Outcome: Adequate for Discharge

## 2024-03-18 NOTE — Discharge Summary (Signed)
 Physician Discharge Summary  Keith Leblanc FMW:[id number] DOB: 1965/07/20 DOA: 03/12/2024  PCP: [name], NP  Admit date: 03/12/2024 Discharge date: 03/18/2024  Time spent: 45 minutes  Recommendations for Outpatient Follow-up:  Advanced heart failure clinic in 1 week, please check BMP at follow-up [facility] Dr. Dolan, referral sent   Discharge Diagnoses:  Principal Problem:   Acute on chronic diastolic CHF (congestive heart failure) (HCC) Active Problems:   Essential hypertension   Tobacco abuse   Stroke (cerebrum) (HCC)   Alcohol use disorder   Marijuana abuse   Controlled diabetes mellitus (HCC)   Acute kidney injury superimposed on chronic kidney disease 3b   Paroxysmal A-fib (HCC)   Dyslipidemia   Biventricular congestive heart failure [facility])   Discharge Condition: Improved low-sodium,  Diet recommendation: Heart healthy  Filed Weights   03/16/24 0451 03/17/24 0538 03/18/24 0500  Weight: 107.3 kg 104.1 kg 104.3 kg    History of present illness:  58/M with history of hypertension, type 2 diabetes mellitus, paroxysmal A-fib on Eliquis , CVA, gout, polysubstance abuse including alcohol, cannabis and cigar admitted with acute diastolic CHF, AKI on CKD 3a workup concerning for cardiac amyloidosis, - Echo with EF 45-50%, severe LVH - RHC noted elevated filling pressures, reduced cardiac index    Hospital Course:   Acute systolic CHF, severe LVH Concern for cardiac amyloidosis -Echo with EF 45-50%, severe LVH, abnormal RV free wall strain, mildly elevated PASP - EtOH could be contributing, Afib as well -Cardiac MRI concerning for amyloidosis -RHC/endomyocardial biopsy with elevated filling pressures, severely reduced cardiac index,  - Followed by heart failure team, improved with diuresis, weight down 40 LB - GDMT limited by AKI/CKD, discussed with cardiology recommended to continue bisoprolol  and Lasix  40 mg as needed, continue  Jardiance  -Close follow-up in heart failure clinic, follow-up biopsy results   AKI on CKD 3a - Baseline creatinine around 1.5,, 2.5 on admission, peaked at 3.16, now improving, avoid hypotension - Will need nephrology follow-up, referral sent to [facility]   Paroxysmal A-fib - Rate controlled, continue Eliquis  - Cards suggested DCCV at follow-up plus or minus TEE if compliance questionable   Polysubstance abuse, EtOH, marijuana - Counseled - No withdrawal noted  History of type 2 diabetes mellitus -At some point he was on insulin  at baseline, not any longer, CBGs stable throughout this admission, A1c is 5.7 this admission, insulin  discontinued -Continue Jardiance    Hypertension Meds as above  Discharge Exam: Vitals:   03/18/24 0017 03/18/24 0500  BP: 107/66 92/77  Pulse: (!) 51 61  Resp: 16 18  Temp: 98.4 F (36.9 C) 98.2 F (36.8 C)  SpO2: 97% 99%    General exam: Appears calm and comfortable  HEENT: no JVD Respiratory system: Clear to auscultation Cardiovascular system: S1 & S2 heard, irregular rhythm Abd: nondistended, soft and nontender.Normal bowel sounds heard. Central nervous system: Alert and oriented. No focal neurological deficits. Extremities: no edema Skin: No rashes Psychiatry:  Mood & affect appropriate.   Discharge Instructions   Discharge Instructions     Ambulatory referral to Nephrology   Complete by: As directed    AKI on CKD, concern for cardiac amyloidosis   Diet - low sodium heart healthy   Complete by: As directed    Increase activity slowly   Complete by: As directed       Allergies as of 03/18/2024   No Known Allergies      Medication List     STOP taking these medications  amLODipine  5 MG tablet Commonly known as: NORVASC    Lantus  SoloStar 100 UNIT/ML Solostar Pen Generic drug: insulin  glargine   potassium chloride  10 MEQ tablet Commonly known as: KLOR-CON    sildenafil  20 MG tablet Commonly known  as: REVATIO        TAKE these medications    allopurinol  300 MG tablet Commonly known as: ZYLOPRIM  TAKE 1 TABLET BY MOUTH DAILY   apixaban  5 MG Tabs tablet Commonly known as: Eliquis  Take 1 tablet (5 mg total) by mouth 2 (two) times daily.   B-D UF III MINI PEN NEEDLES 31G X 5 MM Misc Generic drug: Insulin  Pen Needle USE 4 TIMES DAILY AFTER MEALS AND AT BEDTIME   bisoprolol  10 MG tablet Commonly known as: ZEBETA  TAKE 2 TABLETS BY MOUTH DAILY What changed: when to take this   blood glucose meter kit and supplies Dispense based on patient and insurance preference. Use up to four times daily as directed. (FOR ICD-9 250.00, 250.01).   buPROPion  300 MG 24 hr tablet Commonly known as: WELLBUTRIN  XL TAKE 1 TABLET BY MOUTH ONCE  DAILY   Clear Eyes Natural Tears 5-6 MG/ML Soln Generic drug: Polyvinyl Alcohol-Povidone Place 1 drop into both eyes 3 (three) times daily as needed (for dryness).   colchicine  0.6 MG tablet TAKE 1 TABLET BY MOUTH DAILY  WHEN HAVING FLARES TAKE 1 TABLET BY MOUTH TWICE DAILY What changed: See the new instructions.   empagliflozin  10 MG Tabs tablet Commonly known as: JARDIANCE  Take 1 tablet (10 mg total) by mouth daily. Start taking on: March 19, 2024   furosemide  20 MG tablet Commonly known as: LASIX  Take 2 tablets (40 mg total) by mouth as needed (For increased swelling, weight gain of 3 LB in 1 day or 5 LB in 1 week). What changed: See the new instructions.   magnesium  oxide 400 (240 Mg) MG tablet Commonly known as: MAG-OX TAKE 1 TABLET(400 MG) BY MOUTH DAILY   nicotine  7 mg/24hr patch Commonly known as: NICODERM CQ  - dosed in mg/24 hr Place 1 patch (7 mg total) onto the skin daily. Start taking on: March 19, 2024   omeprazole  40 MG capsule Commonly known as: PRILOSEC TAKE 1 CAPSULE BY MOUTH DAILY What changed: when to take this   One-A-Day Mens 50+ Tabs Take 1 tablet by mouth daily with breakfast.   OneTouch Ultra Test test  strip Generic drug: glucose blood Use to check blood sugar 2-3 times a day.   onetouch ultrasoft lancets Test blood sugars as directed. Dx E11.9   rosuvastatin  20 MG tablet Commonly known as: CRESTOR  TAKE 1 TABLET BY MOUTH DAILY   thiamine  100 MG tablet Commonly known as: Vitamin B-1 Take 1 tablet (100 mg total) by mouth daily.       No Known Allergies  Follow-up Information     [name], NP Follow up.   Specialty: Family Medicine Why: please call your PCP office to schedule hospital follow up in 7-14 days. Contact information: [address] [place] [address] [phone]                  The results of significant diagnostics from this hospitalization (including imaging, microbiology, ancillary and laboratory) are listed below for reference.    Significant Diagnostic Studies: ECHOCARDIOGRAM LIMITED Result Date: 03/16/2024    ECHOCARDIOGRAM LIMITED REPORT   Patient Name:   LARRI YEHLE Date of Exam: 03/16/2024 Medical Rec #:  [id number]       Height:  77.0 in Accession #:    [id number]      Weight:       236.6 lb Date of Birth:  [birth year]       BSA:          2.402 m Patient Age:    58 years        BP:           106/78 mmHg Patient Gender: M               HR:           60 bpm. Exam Location:  Inpatient Procedure: Limited Color Doppler and Limited Echo (Both Spectral and Color Flow            Doppler were utilized during procedure). Indications:    Cardiac Sarcoidosis  History:        Patient has prior history of Echocardiogram examinations, most                 recent 01/27/2024. CHF, Stroke and CKD, Arrythmias:Atrial                 Fibrillation; Risk Factors:Hypertension, Diabetes and                 Dyslipidemia.  Sonographer:    Thea Norlander RCS Referring Phys: [id number] BENJAMIN J STONER IMPRESSIONS  1. Left ventricular ejection fraction, by estimation, is 55 to 60%. The left ventricle has normal function.  2. Right ventricular systolic  function is low normal.  3. Echo performed as a part of biopsy. There is no evidence of significant pericardial effusion after procedural intervention. FINDINGS  Left Ventricle: Left ventricular ejection fraction, by estimation, is 55 to 60%. The left ventricle has normal function. Right Ventricle: Right ventricular systolic function is low normal. Tricuspid Valve: The tricuspid valve is normal in structure. Tricuspid valve regurgitation is trivial. No evidence of tricuspid stenosis. Stanly Leavens MD Electronically signed by Stanly Leavens MD Signature Date/Time: 03/16/2024/6:00:48 PM    Final    MR CARDIAC MORPHOLOGY W WO CONTRAST Result Date: 03/16/2024 CLINICAL DATA:  Clinical question of infiltrative cardiomyopathy Study assumes HCT of 38 and BSA of 2.41 m2. EXAM: CARDIAC MRI TECHNIQUE: The patient was scanned on a 1.5 Tesla GE magnet. A dedicated cardiac coil was used. Functional imaging was done using Fiesta sequences. 2,3, and 4 chamber views were done to assess for RWMA's. Modified Simpson's rule using was used to calculate an ejection fraction on a dedicated work [employer], [employer]. The patient received 10 cc of Gadavist . After 10 minutes inversion recovery sequences were used to assess for infiltration and scar tissue. Flow quantification was performed 2 times during this examination with flow quantification performed at the levels of the ascending aorta above the valve, pulmonary artery above the valve. CONTRAST:  10 cc  of Gadavist  FINDINGS: 1. Normal left ventricular size, with LVEDD 53 mm, and LVEDVi 77 mL/m2. Moderate concentric hypertrophy, with intraventricular septal thickness of 14 mm, posterior wall thickness of 13 mm, and myocardial mass index of 90 g/m2, severely elevated. Mild decrease in left ventricular systolic function (LVEF =43%). Global hypokinesis. Left ventricular parametric mapping notable for normal T2. ECV elevated, 36%. There is late gadolinium enhancement  in the left ventricular myocardium- diffuse patchy LGE throughout myocardium with difficulty nulling myocardium. Normal resting perfusion. 2.  Dilated right ventricular size with RVEDVI 100 mL/m2. Mild increase in thickness of the RV free wall, 5 mm. Low normal right ventricular systolic  function (RVEF =47%). There are no regional wall motion abnormalities or aneurysms. 3.  Normal left and right atrial size. 4. Normal size of the aortic root, ascending aorta. Severe dilation of pulmonary artery, 35 mm. 5. Valve assessment: Aortic Valve: Valve morphology is not well visualized. Qualitatively, there is no significant regurgitation. Regurgitant fraction < 1%. Mean gradient 1 mm Hg. Pulmonic Valve: Qualitatively, there is no significant regurgitation. Regurgitant fraction 4%. Tricuspid Valve: Valve morphology is tri-leaflet. Qualitatively, there is no significant regurgitation. Mitral Valve: Valve morphology is notable for relative posterior restriction. Qualitatively, there is mild regurgitation. Regurgitant fraction 13%. 6.  Normal pericardium.  Trivial RV anterior pericardial effusion. 7. Grossly, a right sided pleural effusion is noted. Recommended dedicated study if concerned for non-cardiac pathology. IMPRESSION: 1. Mild decrease in left ventricular systolic function (LVEF =43%). Myocardial mass index of 90 g/m2, severely elevated with LGE pattern and parametric mapping consistent with cardiac amyloidosis. 2. Low normal right ventricular systolic function (RVEF =47%). Mild increase in thickness, consistent with cardiac amyloidosis. Dilation of the pulmonary artery with RV dilation by volumetric assessment. Results discussed with primary team. Stanly Leavens MD Electronically Signed   By: Stanly Leavens M.D.   On: 03/16/2024 16:12   MR CARDIAC VELOCITY FLOW MAP Result Date: 03/16/2024 CLINICAL DATA:  Clinical question of infiltrative cardiomyopathy Study assumes HCT of 38 and BSA of 2.41 m2. EXAM:  CARDIAC MRI TECHNIQUE: The patient was scanned on a 1.5 Tesla GE magnet. A dedicated cardiac coil was used. Functional imaging was done using Fiesta sequences. 2,3, and 4 chamber views were done to assess for RWMA's. Modified Simpson's rule using was used to calculate an ejection fraction on a dedicated work [employer] officer, [employer]. The patient received 10 cc of Gadavist . After 10 minutes inversion recovery sequences were used to assess for infiltration and scar tissue. Flow quantification was performed 2 times during this examination with flow quantification performed at the levels of the ascending aorta above the valve, pulmonary artery above the valve. CONTRAST:  10 cc  of Gadavist  FINDINGS: 1. Normal left ventricular size, with LVEDD 53 mm, and LVEDVi 77 mL/m2. Moderate concentric hypertrophy, with intraventricular septal thickness of 14 mm, posterior wall thickness of 13 mm, and myocardial mass index of 90 g/m2, severely elevated. Mild decrease in left ventricular systolic function (LVEF =43%). Global hypokinesis. Left ventricular parametric mapping notable for normal T2. ECV elevated, 36%. There is late gadolinium enhancement in the left ventricular myocardium- diffuse patchy LGE throughout myocardium with difficulty nulling myocardium. Normal resting perfusion. 2.  Dilated right ventricular size with RVEDVI 100 mL/m2. Mild increase in thickness of the RV free wall, 5 mm. Low normal right ventricular systolic function (RVEF =47%). There are no regional wall motion abnormalities or aneurysms. 3.  Normal left and right atrial size. 4. Normal size of the aortic root, ascending aorta. Severe dilation of pulmonary artery, 35 mm. 5. Valve assessment: Aortic Valve: Valve morphology is not well visualized. Qualitatively, there is no significant regurgitation. Regurgitant fraction < 1%. Mean gradient 1 mm Hg. Pulmonic Valve: Qualitatively, there is no significant regurgitation. Regurgitant fraction 4%. Tricuspid  Valve: Valve morphology is tri-leaflet. Qualitatively, there is no significant regurgitation. Mitral Valve: Valve morphology is notable for relative posterior restriction. Qualitatively, there is mild regurgitation. Regurgitant fraction 13%. 6.  Normal pericardium.  Trivial RV anterior pericardial effusion. 7. Grossly, a right sided pleural effusion is noted. Recommended dedicated study if concerned for non-cardiac pathology. IMPRESSION: 1. Mild decrease in left ventricular  systolic function (LVEF =43%). Myocardial mass index of 90 g/m2, severely elevated with LGE pattern and parametric mapping consistent with cardiac amyloidosis. 2. Low normal right ventricular systolic function (RVEF =47%). Mild increase in thickness, consistent with cardiac amyloidosis. Dilation of the pulmonary artery with RV dilation by volumetric assessment. Results discussed with primary team. Stanly Leavens MD Electronically Signed   By: Stanly Leavens M.D.   On: 03/16/2024 16:12   MR CARDIAC VELOCITY FLOW MAP Result Date: 03/16/2024 CLINICAL DATA:  Clinical question of infiltrative cardiomyopathy Study assumes HCT of 38 and BSA of 2.41 m2. EXAM: CARDIAC MRI TECHNIQUE: The patient was scanned on a 1.5 Tesla GE magnet. A dedicated cardiac coil was used. Functional imaging was done using Fiesta sequences. 2,3, and 4 chamber views were done to assess for RWMA's. Modified Simpson's rule using was used to calculate an ejection fraction on a dedicated work [employer] officer, [employer]. The patient received 10 cc of Gadavist . After 10 minutes inversion recovery sequences were used to assess for infiltration and scar tissue. Flow quantification was performed 2 times during this examination with flow quantification performed at the levels of the ascending aorta above the valve, pulmonary artery above the valve. CONTRAST:  10 cc  of Gadavist  FINDINGS: 1. Normal left ventricular size, with LVEDD 53 mm, and LVEDVi 77 mL/m2. Moderate  concentric hypertrophy, with intraventricular septal thickness of 14 mm, posterior wall thickness of 13 mm, and myocardial mass index of 90 g/m2, severely elevated. Mild decrease in left ventricular systolic function (LVEF =43%). Global hypokinesis. Left ventricular parametric mapping notable for normal T2. ECV elevated, 36%. There is late gadolinium enhancement in the left ventricular myocardium- diffuse patchy LGE throughout myocardium with difficulty nulling myocardium. Normal resting perfusion. 2.  Dilated right ventricular size with RVEDVI 100 mL/m2. Mild increase in thickness of the RV free wall, 5 mm. Low normal right ventricular systolic function (RVEF =47%). There are no regional wall motion abnormalities or aneurysms. 3.  Normal left and right atrial size. 4. Normal size of the aortic root, ascending aorta. Severe dilation of pulmonary artery, 35 mm. 5. Valve assessment: Aortic Valve: Valve morphology is not well visualized. Qualitatively, there is no significant regurgitation. Regurgitant fraction < 1%. Mean gradient 1 mm Hg. Pulmonic Valve: Qualitatively, there is no significant regurgitation. Regurgitant fraction 4%. Tricuspid Valve: Valve morphology is tri-leaflet. Qualitatively, there is no significant regurgitation. Mitral Valve: Valve morphology is notable for relative posterior restriction. Qualitatively, there is mild regurgitation. Regurgitant fraction 13%. 6.  Normal pericardium.  Trivial RV anterior pericardial effusion. 7. Grossly, a right sided pleural effusion is noted. Recommended dedicated study if concerned for non-cardiac pathology. IMPRESSION: 1. Mild decrease in left ventricular systolic function (LVEF =43%). Myocardial mass index of 90 g/m2, severely elevated with LGE pattern and parametric mapping consistent with cardiac amyloidosis. 2. Low normal right ventricular systolic function (RVEF =47%). Mild increase in thickness, consistent with cardiac amyloidosis. Dilation of the  pulmonary artery with RV dilation by volumetric assessment. Results discussed with primary team. Stanly Leavens MD Electronically Signed   By: Stanly Leavens M.D.   On: 03/16/2024 16:12   CARDIAC CATHETERIZATION Result Date: 03/16/2024 HEMODYNAMICS: RA:       13 mmHg (mean) RV:       50/4, 13 mmHg PA:       50/22 mmHg (32 mean) PCWP: 22 mmHg (mean) with v waves to    Estimated Fick CO/CI   7.06L/min, 2.89L/min/m2 Thermodilution CO/CI   4.48L/min, 1.83L/min/m2  TPG  10  mmHg     PVR  2.23 Wood Units PAPi  2.1  IMPRESSION: Right heart catheterization and endomyocardial biopsy with 4 specimens obtained Moderately elevated biventricular filling pressures Severely reduced index by thermodilution Moderate group II PH with evidence of noncompliant left atrium Post procedure RA and echo similar to pre RECOMMENDATIONS: Await specimen results, findings communicated to HF team   DG Chest 2 View Result Date: 03/12/2024 CLINICAL DATA:  Shortness of breath. EXAM: CHEST - 2 VIEW COMPARISON:  July 07, 2016. FINDINGS: Stable cardiomediastinal silhouette. Left lung is clear. Elevated right hemidiaphragm is noted with mild right basilar subsegmental atelectasis. Small right pleural effusion may be present. Bony thorax is unremarkable. IMPRESSION: Elevated right hemidiaphragm with mild right basilar subsegmental atelectasis. Possible small right pleural effusion. Electronically Signed   By: Lynwood Landy Raddle M.D.   On: 03/12/2024 09:56    Microbiology: Recent Results (from the past 240 hours)  Resp panel by RT-PCR (RSV, Flu A&B, Covid) Anterior Nasal Swab     Status: None   Collection Time: 03/12/24  9:17 AM   Specimen: Anterior Nasal Swab  Result Value Ref Range Status   SARS Coronavirus 2 by RT PCR NEGATIVE NEGATIVE Final    Comment: (NOTE) SARS-CoV-2 target nucleic acids are NOT DETECTED.  The SARS-CoV-2 RNA is generally detectable in upper respiratory specimens during the acute phase of  infection. The lowest concentration of SARS-CoV-2 viral copies this assay can detect is 138 copies/mL. A negative result does not preclude SARS-Cov-2 infection and should not be used as the sole basis for treatment or other patient management decisions. A negative result may occur with  improper specimen collection/handling, submission of specimen other than nasopharyngeal swab, presence of viral mutation(s) within the areas targeted by this assay, and inadequate number of viral copies(<138 copies/mL). A negative result must be combined with clinical observations, patient history, and epidemiological information. The expected result is Negative.  Fact Sheet for Patients:  BloggerCourse.com  Fact Sheet for Healthcare Providers:  SeriousBroker.it  This test is no t yet approved or cleared by the [place]  FDA and  has been authorized for detection and/or diagnosis of SARS-CoV-2 by FDA under an Emergency Use Authorization (EUA). This EUA will remain  in effect (meaning this test can be used) for the duration of the COVID-19 declaration under Section 564(b)(1) of the Act, 21 U.S.C.section 360bbb-3(b)(1), unless the authorization is terminated  or revoked sooner.       Influenza A by PCR NEGATIVE NEGATIVE Final   Influenza B by PCR NEGATIVE NEGATIVE Final    Comment: (NOTE) The Xpert Xpress SARS-CoV-2/FLU/RSV plus assay is intended as an aid in the diagnosis of influenza from Nasopharyngeal swab specimens and should not be used as a sole basis for treatment. Nasal washings and aspirates are unacceptable for Xpert Xpress SARS-CoV-2/FLU/RSV testing.  Fact Sheet for Patients: BloggerCourse.com  Fact Sheet for Healthcare Providers: SeriousBroker.it  This test is not yet approved or cleared by the [place]  FDA and has been authorized for detection and/or diagnosis of SARS-CoV-2  by FDA under an Emergency Use Authorization (EUA). This EUA will remain in effect (meaning this test can be used) for the duration of the COVID-19 declaration under Section 564(b)(1) of the Act, 21 U.S.C. section 360bbb-3(b)(1), unless the authorization is terminated or revoked.     Resp Syncytial Virus by PCR NEGATIVE NEGATIVE Final    Comment: (NOTE) Fact Sheet for Patients: BloggerCourse.com  Fact Sheet for Healthcare Providers: SeriousBroker.it  This test is not yet approved or cleared by the [place]  FDA and has been authorized for detection and/or diagnosis of SARS-CoV-2 by FDA under an Emergency Use Authorization (EUA). This EUA will remain in effect (meaning this test can be used) for the duration of the COVID-19 declaration under Section 564(b)(1) of the Act, 21 U.S.C. section 360bbb-3(b)(1), unless the authorization is terminated or revoked.  Performed at [facility], [address] [place], [place] [address]      Labs: Basic Metabolic Panel: Recent Labs  Lab 03/12/24 [phone] 03/13/24 0418 03/14/24 9661 03/15/24 0546 03/16/24 0436 03/16/24 0806 03/16/24 0830 03/17/24 0556 03/18/24 0357  NA 144 142 141 142 141 141  141  --  140 140  K 4.1 3.2* 3.5 3.3* 3.7 3.9  4.0  --  4.1 4.7  CL 105 103 99 97* 95*  --   --  96* 99  CO2 22 26 30  33* 32  --   --  32 28  GLUCOSE 77 53* 109* 113* 109*  --   --  113* 115*  BUN 25* 24* 23* 21* 22*  --   --  20 24*  CREATININE 2.53* 2.63* 3.16* 3.08* 2.79*  --   --  3.05* 2.88*  CALCIUM  9.7 9.3 9.3 9.3 9.5  --   --  9.5 9.3  MG 1.6* 1.7  --   --   --   --  1.8  --   --    Liver Function Tests: No results for input(s): AST, ALT, ALKPHOS, BILITOT, PROT, ALBUMIN in the last 168 hours. No results for input(s): LIPASE, AMYLASE in the last 168 hours. No results for input(s): AMMONIA in the last 168 hours. CBC: Recent Labs  Lab  03/14/24 0338 03/15/24 0546 03/16/24 0436 03/16/24 0806 03/17/24 0556 03/18/24 0357  WBC 5.0 4.5 4.6  --  4.9 4.7  HGB 11.1* 11.4* 12.1* 13.3  13.6 13.2 12.3*  HCT 34.8* 36.1* 38.0* 39.0  40.0 41.5 38.7*  MCV 92.1 92.8 91.8  --  92.2 91.1  PLT 258 277 298  --  333 332   Cardiac Enzymes: No results for input(s): CKTOTAL, CKMB, CKMBINDEX, TROPONINI in the last 168 hours. BNP: BNP (last 3 results) No results for input(s): BNP in the last 8760 hours.  ProBNP (last 3 results) Recent Labs    03/12/24 0942  PROBNP 5,364.0*    CBG: Recent Labs  Lab 03/16/24 2101 03/17/24 0733 03/17/24 1155 03/17/24 1554 03/17/24 2053  GLUCAP 144* 120* 127* 177* 235*       Signed:  Sigurd Pac MD.  [facility] 03/18/2024, 12:06 PM

## 2024-03-18 NOTE — TOC Transition Note (Addendum)
 Transition of Care [facility]) - Discharge Note   Patient Details  Name: Keith Leblanc MRN: [id number] Date of Birth: 11/19/1965  Transition of Care [facility]) CM/SW Contact:  Justina Delcia Czar, RN Phone Number: [phone] 03/18/2024, 12:14 PM   Clinical Narrative:    Spoke to pt and does not need note for work.  Sister will provide transportation home.  Scheduled PCP hospital follow up appt for 03/23/2024 at 11 am.    Final next level of care: Home/Self Care Barriers to Discharge: No Barriers Identified   Patient Goals and CMS Choice Patient states their goals for this hospitalization and ongoing recovery are:: wants recover          Discharge Placement                       Discharge Plan and Services Additional resources added to the After Visit Summary for   In-house Referral: NA Discharge Planning Services: CM Consult Post Acute Care Choice: NA            DME Agency: NA                  Social Drivers of Health (SDOH) Interventions SDOH Screenings   Food Insecurity: No Food Insecurity (03/12/2024)  Housing: Low Risk  (03/12/2024)  Transportation Needs: No Transportation Needs (03/12/2024)  Utilities: Not At Risk (03/12/2024)  Depression (PHQ2-9): High Risk (09/12/2021)  Tobacco Use: High Risk (03/12/2024)     Readmission Risk Interventions    03/15/2024   12:35 PM  Readmission Risk Prevention Plan  Transportation Screening Complete  HRI or Home Care Consult Complete  Social Work Consult for Recovery Care Planning/Counseling Complete  Palliative Care Screening Not Applicable  Medication Review Oceanographer) Referral to Pharmacy

## 2024-03-18 NOTE — Plan of Care (Signed)

## 2024-03-18 NOTE — Progress Notes (Signed)
 Advanced Heart Failure Rounding Note  Cardiologist: None  Chief Complaint: Heart Failure  Subjective:    - Feels significantly better today; ambulating the halls without difficulty.   Objective:   Weight Range: 104.3 kg Body mass index is 27.26 kg/m.   Vital Signs:   Temp:  [97.6 F (36.4 C)-98.6 F (37 C)] 98.2 F (36.8 C) (09/25 0500) Pulse Rate:  [51-61] 61 (09/25 0500) Resp:  [16-18] 18 (09/25 0500) BP: (92-107)/(56-77) 92/77 (09/25 0500) SpO2:  [97 %-99 %] 99 % (09/25 0500) Weight:  [104.3 kg] 104.3 kg (09/25 0500) Last BM Date : 03/16/24  Weight change: Filed Weights   03/16/24 0451 03/17/24 0538 03/18/24 0500  Weight: 107.3 kg 104.1 kg 104.3 kg    Intake/Output:   Intake/Output Summary (Last 24 hours) at 03/18/2024 1115 Last data filed at 03/18/2024 0526 Gross per 24 hour  Intake 363 ml  Output 850 ml  Net -487 ml    Physical Exam  General:  well appearing.  No respiratory difficulty Neck: JVD flat.  Cor: Regular rate & irregular rhythm. No murmurs. Lungs: clear Extremities: no edema  Neuro: alert & oriented x 3. Affect pleasant.   Telemetry   A fib 50s-60s (Personally reviewed)    Labs    CBC Recent Labs    03/17/24 0556 03/18/24 0357  WBC 4.9 4.7  HGB 13.2 12.3*  HCT 41.5 38.7*  MCV 92.2 91.1  PLT 333 332   Basic Metabolic Panel Recent Labs    90/76/74 0830 03/17/24 0556 03/18/24 0357  NA  --  140 140  K  --  4.1 4.7  CL  --  96* 99  CO2  --  32 28  GLUCOSE  --  113* 115*  BUN  --  20 24*  CREATININE  --  3.05* 2.88*  CALCIUM   --  9.5 9.3  MG 1.8  --   --    Liver Function Tests No results for input(s): AST, ALT, ALKPHOS, BILITOT, PROT, ALBUMIN in the last 72 hours. No results for input(s): LIPASE, AMYLASE in the last 72 hours. Cardiac Enzymes No results for input(s): CKTOTAL, CKMB, CKMBINDEX, TROPONINI in the last 72 hours.  BNP: BNP (last 3 results) No results for input(s): BNP in the  last 8760 hours.  ProBNP (last 3 results) Recent Labs    03/12/24 0942  PROBNP 5,364.0*     D-Dimer No results for input(s): DDIMER in the last 72 hours. Hemoglobin A1C No results for input(s): HGBA1C in the last 72 hours. Fasting Lipid Panel No results for input(s): CHOL, HDL, LDLCALC, TRIG, CHOLHDL, LDLDIRECT in the last 72 hours. Thyroid  Function Tests No results for input(s): TSH, T4TOTAL, T3FREE, THYROIDAB in the last 72 hours.  Invalid input(s): FREET3  Other results:  Imaging    No results found.    Medications:     Scheduled Medications:  allopurinol   300 mg Oral Daily   apixaban   5 mg Oral BID   bisoprolol   20 mg Oral q AM   buPROPion   300 mg Oral Daily   empagliflozin   10 mg Oral Daily   folic acid   1 mg Oral Daily   multivitamin with minerals  1 tablet Oral Daily   nicotine   7 mg Transdermal Daily   pantoprazole   40 mg Oral Daily   potassium chloride   40 mEq Oral BID   rosuvastatin   20 mg Oral Daily   sodium chloride  flush  3 mL Intravenous Q12H   thiamine   100 mg Oral Daily   Or   thiamine   100 mg Intravenous Daily    Infusions:    PRN Medications: acetaminophen  **OR** acetaminophen , albuterol , polyethylene glycol    Patient Profile   Keith Leblanc is a 58 y.o. AAM with DMII, HTN, HLD, hx stroke, CKD, PAF on eliquis , gout and polysubstance abuse (ETOH, cigar, marijuana). Admitted with acute HFpEF   Assessment/Plan  Acute HFmrEF - Echo 8/25: EF 45-50%, severe LVH, abnormal RV free wall strain, ~17%, RV mildly reduced, mildly elevated PASP. LA mod dilated, RA mildly dilated, small pericardial effusion present, trivial MR, mild TR, tricuspid valve abnormally thickened, aortic valve regurgitation trivial.  - Need to r/o iCM. HTN CM (though he reports normal BP readings at home consistently), ETOH induced CM? , tachy mediated (though rate-controlled a fib here)? Infiltrative heart disease? (Pending further w/u).  Familial? (Father with CHF) - NYHA IIIb on admission - RHC with elevated filling pressures and severely reduced index.  - Appears euvolemic on exam and SCr now rising.  - Continue Jardiance  10 mg daily - GDMT limited with renal function - cMRI with LVEF 43%, GHK, mod concentric hypertrophy, LVEF 47%, normal L/R atrial size, severe dilatation of PA, 35mm. LGE pattern consistent with cardiac amyloidosis.  - Genetic testing OP  - Stable for discharge home today on lasix  40mg  PRN. Discussed importance of daily weights. Will follow up closely outpatient. Biopsy results pending. Plan for CPX outpatient. Discussed possible need for advanced therapies.    Concern for cardiac amyloid - PYP 8/25 not suggestive of cardiac amyloid.  - low-voltage EKG - Does not report carpal tunnel or frequent back pain - Severe LVH on echo this admission - cMRI consistent with cardiac amyloidosis - Check K/L chains and UIFE - MMP pending - Biopsy results pending   PAF - Suspect cause of stroke back in 5/24 - Continue Eliquis  5 mg BID - rate controlled on tele - Consider DCCV, +/- TEE if noncompliant with eliquis  at home   HTN - Reports compliance with BP meds at home with controlled BP - Continue current regimen.  - Consider bidil, BP low/stable. No need at this time   AKI on CKD stage IIIa - Baseline SCr ~ 1.5 - Up to 3.16-->2.8 > 3.05-> 2.88 today, improving.  - Holding diuretics - avoid hypotension   ETOH abuse Marijuana abuse - Drinks 1 pint of tequila/day. Smokes marijuana daily - Monitor for signs of withdrawal, does not appear to need CIWA at this time - Cessation advised    Length of Stay: 6  Lyndon Chenoweth, DO  03/18/2024, 11:15 AM  Advanced Heart Failure Team Pager [phone] (M-F; 7a - 5p)  Please contact [facility] for night-coverage after hours (5p -7a ) and weekends on [email or web address]

## 2024-03-19 ENCOUNTER — Telehealth: Payer: Self-pay

## 2024-03-19 LAB — SURGICAL PATHOLOGY

## 2024-03-19 NOTE — Transitions of Care (Post Inpatient/ED Visit) (Signed)
   03/19/2024  Name: Keith Leblanc MRN: [id number] DOB: 09/19/1965  Today's TOC FU Call Status: Today's TOC FU Call Status:: Unsuccessful Call (1st Attempt) Unsuccessful Call (1st Attempt) Date: 03/19/24  Attempted to reach the patient regarding the most recent Inpatient/ED visit. Left a HIPAA approved voicemail message to phone number provided in demographics per DPR.    Follow Up Plan: Additional outreach attempts will be made to reach the patient to complete the Transitions of Care (Post Inpatient/ED visit) call.   Richerd Fish, RN, BSN, CCM [facility], [facility] RN Care Manager Direct Dial: [phone]

## 2024-03-19 NOTE — Transitions of Care (Post Inpatient/ED Visit) (Signed)
   03/19/2024  Name: Wilmore Holsomback MRN: [id number] DOB: [birth year]  Today's TOC FU Call Status: Today's TOC FU Call Status:: Unsuccessful Call (2nd Attempt) Unsuccessful Call (2nd Attempt) Date: 03/19/24  Attempted to reach the patient regarding the most recent Inpatient/ED visit.  Follow Up Plan: Additional outreach attempts will be made to reach the patient to complete the Transitions of Care (Post Inpatient/ED visit) call.   Alan Ee, RN, BSN, CEN [employer]- Transition of Care Team.  [facility] [phone]

## 2024-03-22 ENCOUNTER — Telehealth: Payer: Self-pay

## 2024-03-22 NOTE — Transitions of Care (Post Inpatient/ED Visit) (Signed)
   03/22/2024  Name: Keith Leblanc MRN: [id number] DOB: [birth year]  Today's TOC FU Call Status: Today's TOC FU Call Status:: Unsuccessful Call (3rd Attempt) Unsuccessful Call (3rd Attempt) Date: 03/22/24  Attempted to reach the patient regarding the most recent Inpatient/ED visit.  Follow Up Plan: No further outreach attempts will be made at this time. We have been unable to contact the patient.  Alan Ee, RN, BSN, CEN [employer]- Transition of Care Team.  [facility] [phone]

## 2024-03-23 ENCOUNTER — Encounter: Payer: Self-pay | Admitting: Adult Health

## 2024-03-23 ENCOUNTER — Ambulatory Visit (INDEPENDENT_AMBULATORY_CARE_PROVIDER_SITE_OTHER): Admitting: Adult Health

## 2024-03-23 ENCOUNTER — Ambulatory Visit: Payer: Self-pay | Admitting: Adult Health

## 2024-03-23 ENCOUNTER — Telehealth (HOSPITAL_COMMUNITY): Payer: Self-pay

## 2024-03-23 VITALS — BP 120/100 | HR 66 | Temp 98.3°F | Ht 77.0 in | Wt 236.0 lb

## 2024-03-23 DIAGNOSIS — I10 Essential (primary) hypertension: Secondary | ICD-10-CM

## 2024-03-23 DIAGNOSIS — N1831 Chronic kidney disease, stage 3a: Secondary | ICD-10-CM | POA: Diagnosis not present

## 2024-03-23 DIAGNOSIS — N179 Acute kidney failure, unspecified: Secondary | ICD-10-CM

## 2024-03-23 DIAGNOSIS — I480 Paroxysmal atrial fibrillation: Secondary | ICD-10-CM

## 2024-03-23 DIAGNOSIS — I5033 Acute on chronic diastolic (congestive) heart failure: Secondary | ICD-10-CM | POA: Diagnosis not present

## 2024-03-23 DIAGNOSIS — R21 Rash and other nonspecific skin eruption: Secondary | ICD-10-CM

## 2024-03-23 DIAGNOSIS — Z7984 Long term (current) use of oral hypoglycemic drugs: Secondary | ICD-10-CM

## 2024-03-23 DIAGNOSIS — E119 Type 2 diabetes mellitus without complications: Secondary | ICD-10-CM

## 2024-03-23 LAB — BASIC METABOLIC PANEL WITH GFR
BUN: 27 mg/dL — ABNORMAL HIGH (ref 6–23)
CO2: 30 meq/L (ref 19–32)
Calcium: 10 mg/dL (ref 8.4–10.5)
Chloride: 102 meq/L (ref 96–112)
Creatinine, Ser: 2.42 mg/dL — ABNORMAL HIGH (ref 0.40–1.50)
GFR: 29.21 mL/min — ABNORMAL LOW (ref >60.00)
Glucose, Bld: 127 mg/dL — ABNORMAL HIGH (ref 70–99)
Potassium: 4.4 meq/L (ref 3.5–5.1)
Sodium: 140 meq/L (ref 135–145)

## 2024-03-23 MED ORDER — HYDROXYZINE PAMOATE 50 MG PO CAPS: 50.0000 mg | ORAL_CAPSULE | Freq: Three times a day (TID) | ORAL | 0 refills | Status: AC | PRN

## 2024-03-23 MED ORDER — TRIAMCINOLONE ACETONIDE 0.5 % EX CREA: 1.0000 | TOPICAL_CREAM | Freq: Two times a day (BID) | CUTANEOUS | 0 refills | Status: DC

## 2024-03-23 NOTE — Progress Notes (Incomplete)
 ADVANCED HF CLINIC CONSULT NOTE  Referring Physician: [name], NP Primary Care: [name], NP Primary Cardiologist: None  Chief Complaint: Chronic HFmrEF HPI: Keith Leblanc is a 58 y.o. AAM with DMII, HTN, HLD, hx stroke, CKD, PAF on eliquis , gout and polysubstance abuse (ETOH, cigar, marijuana).   Has had cardiac workup recently with ongoing SOB and constant congestion. Echo 8/25 showed EF 45-50% (drop from 60-65% 5/24) with severe LVH. PYP pursued which was not consistent with amyloid.   Admitted 9/25 with acute HFmrEF. Volume overloaded on admission, diuresed well with IV lasix , down 40lbs at discharge. LHC deferred with CKD. RHC with elevated filling pressures and severely reduced index. cMRI with LVEF 43%, GHK, mod concentric hypertrophy, LVEF 47%, normal L/R atrial size, severe dilatation of PA, 35mm. LGE pattern consistent with cardiac amyloidosis. Underwent cardiac biopsy. GDMT limited by CKD at discharge.   Today he returns for post hospital follow up. Overall feeling good. Denies palpitations, CP, edema, or PND/Orthopnea. Occasional dizziness, typically with brisk movement. No SOB. Appetite good, trying to watch salt intake. Does not eat out much. Weight at home 231 pounds. Taking all medications but does occasionally miss PM dose of Eliquis . Back to working from home. Wants to start walking.   Previously smoked marijuana daily (stopped on admission). Prior heavy ETOH use daily since he was an adult. Previously drinking a pint of tequila a day, also stopped since admission. Works a sedentary job as a Production designer, theatre/television/film for [employer].   Father with hx of CHF.   Cardiac studies reviewed:  cMRI 9/25 with LVEF 43%, GHK, mod concentric hypertrophy, LVEF 47%, normal L/R atrial size, severe dilatation of PA, 35mm. LGE pattern consistent with cardiac amyloidosis.  RHC 9/25: with elevated filling pressures and severely reduced index.  Echo 8/25: EF 45-50%, severe LVH, abnormal RV free  wall strain, ~17%, RV mildly reduced, mildly elevated PASP. LA mod dilated, RA mildly dilated, small pericardial effusion present, trivial MR, mild TR, tricuspid valve abnormally thickened, aortic valve regurgitation trivial.  PYP 8/25 not suggestive of cardiac amyloid.  Echo 5/24: EF 60-65%, mod LVH, RV normal, no MR    Past Medical History:  Diagnosis Date   Blood in stool    bright red blood    Chicken pox    Diabetes mellitus (HCC)    Erectile dysfunction    Gout    Hypertension    Stroke [facility])     Current Outpatient Medications  Medication Sig Dispense Refill   allopurinol  (ZYLOPRIM ) 300 MG tablet TAKE 1 TABLET BY MOUTH DAILY 90 tablet 3   apixaban  (ELIQUIS ) 5 MG TABS tablet Take 1 tablet (5 mg total) by mouth 2 (two) times daily. 180 tablet 1   bisoprolol  (ZEBETA ) 10 MG tablet TAKE 2 TABLETS BY MOUTH DAILY (Patient taking differently: Take 20 mg by mouth in the morning.) 180 tablet 3   blood glucose meter kit and supplies Dispense based on patient and insurance preference. Use up to four times daily as directed. (FOR ICD-9 250.00, 250.01). 1 each 0   buPROPion  (WELLBUTRIN  XL) 300 MG 24 hr tablet TAKE 1 TABLET BY MOUTH ONCE  DAILY 30 tablet 0   CLEAR EYES NATURAL TEARS 5-6 MG/ML SOLN Place 1 drop into both eyes 3 (three) times daily as needed (for dryness).     colchicine  0.6 MG tablet TAKE 1 TABLET BY MOUTH DAILY  WHEN HAVING FLARES TAKE 1 TABLET BY MOUTH TWICE DAILY (Patient taking differently: Take 0.6 mg by mouth as  needed. TAKE 1 TABLET BY MOUTH  DAILY WHEN HAVING FLARES  TAKE 1 TABLET BY MOUTH  TWICE DAILY) 180 tablet 3   empagliflozin  (JARDIANCE ) 10 MG TABS tablet Take 1 tablet (10 mg total) by mouth daily. 30 tablet 1   furosemide  (LASIX ) 20 MG tablet Take 2 tablets (40 mg total) by mouth as needed (For increased swelling, weight gain of 3 LB in 1 day or 5 LB in 1 week). 90 tablet 0   glucose blood (ONETOUCH ULTRA TEST) test strip Use to check blood sugar 2-3 times a day. 300  each 12   hydrOXYzine (VISTARIL) 50 MG capsule Take 1 capsule (50 mg total) by mouth every 8 (eight) hours as needed. 15 capsule 0   Insulin  Pen Needle (B-D UF III MINI PEN NEEDLES) 31G X 5 MM MISC USE 4 TIMES DAILY AFTER MEALS AND AT BEDTIME 100 each 11   Lancets (ONETOUCH ULTRASOFT) lancets Test blood sugars as directed. Dx E11.9 100 each 12   magnesium  oxide (MAG-OX) 400 (240 Mg) MG tablet TAKE 1 TABLET(400 MG) BY MOUTH DAILY 30 tablet 6   Multiple Vitamins-Minerals (ONE-A-DAY MENS 50+) TABS Take 1 tablet by mouth daily with breakfast.     nicotine  (NICODERM CQ  - DOSED IN MG/24 HR) 7 mg/24hr patch Place 1 patch (7 mg total) onto the skin daily. 28 patch 0   omeprazole  (PRILOSEC) 40 MG capsule TAKE 1 CAPSULE BY MOUTH DAILY (Patient taking differently: Take 40 mg by mouth daily before breakfast.) 90 capsule 3   potassium chloride  SA (KLOR-CON  M) 20 MEQ tablet Take 2 tablets (40 mEq total) by mouth daily as needed (take with lasix  as needed dose). 30 tablet 2   rosuvastatin  (CRESTOR ) 20 MG tablet TAKE 1 TABLET BY MOUTH DAILY 90 tablet 0   thiamine  (VITAMIN B-1) 100 MG tablet Take 1 tablet (100 mg total) by mouth daily. 30 tablet 0   triamcinolone cream (KENALOG) 0.5 % Apply 1 Application topically 2 (two) times daily. 90 g 0   No current facility-administered medications for this encounter.    No Known Allergies    Social History   Socioeconomic History   Marital status: Single    Spouse name: Not on file   Number of children: Not on file   Years of education: Not on file   Highest education level: Not on file  Occupational History   Not on file  Tobacco Use   Smoking status: Former    Current packs/day: 0.00    Types: Cigarettes, Cigars    Quit date: 03/10/2024    Years since quitting: 0.0   Smokeless tobacco: Never  Vaping Use   Vaping status: Never Used  Substance and Sexual Activity   Alcohol use: Not Currently   Drug use: Not Currently    Types: Marijuana   Sexual  activity: Yes  Other Topics Concern   Not on file  Social History Narrative   Works for [employer]    Not married    One daughter who does not live with him    Likes to gamble, walk in the parks, travel.    Social Drivers of Corporate investment banker Strain: Not on file  Food Insecurity: No Food Insecurity (03/12/2024)   Hunger Vital Sign    Worried About Running Out of Food in the Last Year: Never true    Ran Out of Food in the Last Year: Never true  Transportation Needs: No Transportation Needs (03/12/2024)   PRAPARE -  Administrator, Civil Service (Medical): No    Lack of Transportation (Non-Medical): No  Physical Activity: Not on file  Stress: Not on file  Social Connections: Not on file  Intimate Partner Violence: Not At Risk (03/12/2024)   Humiliation, Afraid, Rape, and Kick questionnaire    Fear of Current or Ex-Partner: No    Emotionally Abused: No    Physically Abused: No    Sexually Abused: No      Family History  Problem Relation Age of Onset   Alcohol abuse Father    Hypertension Father    Heart disease Father    Gout Father    Breast cancer Mother    Hypertension Mother    Gout Mother    Healthy Sister    Healthy Daughter    Colon cancer Neg Hx    Esophageal cancer Neg Hx    Rectal cancer Neg Hx    Stomach cancer Neg Hx     Vitals:   03/24/24 1550  BP: (!) 120/90  Pulse: 62  SpO2: 99%  Weight: 108.5 kg (239 lb 3.2 oz)  Height: 6' 5 (1.956 m)    PHYSICAL EXAM: General:  well appearing.  No respiratory difficulty. Walked into clinic.  Neck: JVD ~6 cm.  Cor: Regular rate & rhythm. No murmurs. Lungs: clear Extremities: non-pitting BLE edema  Neuro: alert & oriented x 3. Affect pleasant.   ECG: a fib, low voltage EKG 58 bpm (Personally reviewed)     ASSESSMENT & PLAN: Chronic HFmrEF - Echo 8/25: EF 45-50%, severe LVH, abnormal RV free wall strain, ~17%, RV mildly reduced, mildly elevated PASP. LA mod dilated, RA mildly dilated, small  pericardial effusion present, trivial MR, mild TR, tricuspid valve abnormally thickened, aortic valve regurgitation trivial.  - Need to r/o iCM. HTN CM?, ETOH induced CM? , tachy mediated (though rate-controlled a fib here)? Infiltrative heart disease? (Work up negative). Familial? (Father with CHF) - RHC 9/25 with elevated filling pressures and severely reduced index.  cMRI with LVEF 43%, GHK, mod concentric hypertrophy, LVEF 47%, normal L/R atrial size, severe dilatation of PA, 35mm. LGE pattern consistent with cardiac amyloidosis.  - NYHA I-II today - Appears euvolemic on exam. Has been taking lasix  40 mg daily rather than PRN. Change back to PRN today along with the [facility]. Will check labs today, if worrisome for volume will restart lasix /KDUR.  - Continue Jardiance  10 mg daily - GDMT limited with renal function - Will order genetic testing today - Biopsy results negative for amyloid. Plan for CPX, scheduled for tomorrow. Discussed possible need for advanced therapies.    Concern for cardiac amyloid - PYP 8/25 not suggestive of cardiac amyloid.  - low-voltage EKG - Does not report carpal tunnel or frequent back pain - Severe LVH on echo this admission - cMRI consistent with cardiac amyloidosis - K/L chains elevated - MMP no M-Spike - Biopsy results negative for amyloid. The myocytes show cellular e hypertrophy with enlarged, hyperchromatic and irregular nuclei.  These features are most typical of hypertrophic or dilated cardiomyopathy. Plan to send genetic testing today.    PAF - Suspect cause of stroke back in 5/24 - Continue Eliquis  5 mg BID. Denies abnormal bleeding.  - EKG with rate controleld a fib today. Has been noncompliant with Eliquis  at home. Stressed importance of BID dosing today. - Consider DCCV, +/- TEE if he persists with noncompliant with eliquis  at home   HTN - BP stable today.  - Continue  current regimen.  - Consider bidil, BP low/stable. No need at this time    CKD stage IIIa - Baseline SCr ~ 1.5 - Up to 3.16 during last admission.  - Last 2.42 - Labs today   ETOH abuse Marijuana abuse - Previously used to drink 1 pint of tequila/day. Smoked marijuana daily - Has not drank or smoked since he was admitted. Congratulated.   Saw PCP yesterday  Follow up with APP in ~4 weeks.   Beckey LITTIE Coe AGACNP-BC  03/24/24   [facility] [place] [address] [facility] [place] [address] [phone] (office)

## 2024-03-23 NOTE — Telephone Encounter (Signed)
 Called to confirm/remind patient of their appointment at the Advanced Heart Failure Clinic on 03/24/24 3:30.   Appointment:   [] Confirmed  [x] Left mess   [] No answer/No voice mail  [] VM Full/unable to leave message  [] Phone not in service  Patient reminded to bring all medications and/or complete list.  Confirmed patient has transportation. Gave directions, instructed to utilize valet parking.

## 2024-03-23 NOTE — Progress Notes (Signed)
 Subjective:    Patient ID: Keith Leblanc, male    DOB: 06/20/1966, 58 y.o.   MRN: [id number]  HPI 57 year old male who  has a past medical history of Blood in stool, Chicken pox, Diabetes mellitus (HCC), Erectile dysfunction, Gout, Hypertension, and Stroke (HCC).  He presents to the office today for TCM visit  Admit Date 03/12/2024 Discharge Date 03/18/2024  He presented to the emergency room on 03/12/2024 with the complaints of worsening BLE, abdominal swelling and bilateral leg edema.  His symptoms started around April 2025 with dyspnea when he did his usual activities such as working out the trash can.  He started noticing bilateral leg edema about the same time which he attributed to his sedentary work life/sitting for most of the day.  Over the next few weeks he noticed progressive worsening dyspnea on exertion and decreasing effort tolerance.  He was admitted for acute diastolic CHF, AKI on CKD 3 A and workup concerning for cardiac amyloidosis   Hospital Course  Acute Systolic CHF with severe LVH  - Concern for Cardiac Amyloidosis - Echo with EF 45 to 50%, severe LVH, abnormal RV free wall strain, mildly elevated PASP. - Thought alcohol could be contributing as well as A-fib.  Cardiac MRI concerning for amyloidosis. - RHC/endomyocardial biopsy with elevated filling pressures, severely reduced cardiac index. - He is followed by the heart failure team and improved with diuresis, weight down 40 pounds. - GDMT limited by AKI/CKD, discussed with cardiology and he was recommended to continue bisoprolol  and Lasix  40 mg as needed, continue Jardiance  - Follow-up with heart failure team  AKI on CKD 3a - Baseline creatinine around 1.5, 2.5 on admission, peaked at 3.16.  Was improving upon discharge. - He was referred to [facility]  Paroxysmal A-fib - Rate controlled was continued on Eliquis .  Cardiology suggested DCCV at follow-up plus or minus TEE if compliance  questionable  Polysubstance abuse, EtOH, marijuana - Counseling provided.  He did not go through any withdrawal during hospital admission  History of type 2 diabetes - CBGs stable throughout his admission.  A1c was 5.7 during this hospital admission. He was started on Jardiance  in the hospital and was taken off insulin    Hypertension - Continued on bisoprolol  and Lasix  40 mg as needed  Today he reports that he is doing well.  He has an appointment tomorrow with cardiology.  He denies any chest pain or shortness of breath.  He has not had any discharge from the hospital.  He is happy to report that he is 13 sober and cannot believe the change diabetic overall.  His only complaint today is a rash that he has developed on his abdomin and lower back.  Ports that he first noticed the rash a day or 2 before he was discharged from the hospital.  The rash is itchy and burns.  At home he has been applying over-the-counter cortisone cream as well as taking Benadryl these do not seem to be helping.  He is wondering if this from starting Jardiance    Review of Systems  Constitutional: Negative.   HENT: Negative.    Eyes: Negative.   Respiratory: Negative.    Cardiovascular: Negative.   Gastrointestinal: Negative.   Endocrine: Negative.   Genitourinary: Negative.   Musculoskeletal: Negative.   Skin:  Positive for rash.  Allergic/Immunologic: Negative.   Neurological: Negative.   Hematological: Negative.   Psychiatric/Behavioral: Negative.    All other systems reviewed and are negative.  Past Medical History:  Diagnosis Date   Blood in stool    bright red blood    Chicken pox    Diabetes mellitus (HCC)    Erectile dysfunction    Gout    Hypertension    Stroke [facility])     Social History   Socioeconomic History   Marital status: Single    Spouse name: Not on file   Number of children: Not on file   Years of education: Not on file   Highest education level: Not on file  Occupational  History   Not on file  Tobacco Use   Smoking status: Former    Types: Cigarettes, Cigars    Quit date: 03/10/2024    Years since quitting: 0.0   Smokeless tobacco: Never  Vaping Use   Vaping status: Never Used  Substance and Sexual Activity   Alcohol use: Yes    Comment: Drinks 1 pint of tequila daily.   Drug use: Yes    Types: Marijuana   Sexual activity: Yes  Other Topics Concern   Not on file  Social History Narrative   Works for [employer]    Not married    One daughter who does not live with him    Likes to gamble, walk in the parks, travel.    Social Drivers of Corporate investment banker Strain: Not on file  Food Insecurity: No Food Insecurity (03/12/2024)   Hunger Vital Sign    Worried About Running Out of Food in the Last Year: Never true    Ran Out of Food in the Last Year: Never true  Transportation Needs: No Transportation Needs (03/12/2024)   PRAPARE - Administrator, Civil Service (Medical): No    Lack of Transportation (Non-Medical): No  Physical Activity: Not on file  Stress: Not on file  Social Connections: Not on file  Intimate Partner Violence: Not At Risk (03/12/2024)   Humiliation, Afraid, Rape, and Kick questionnaire    Fear of Current or Ex-Partner: No    Emotionally Abused: No    Physically Abused: No    Sexually Abused: No    Past Surgical History:  Procedure Laterality Date   APPENDECTOMY  2004   COLONOSCOPY     ENDOMYOCARDIAL BIOPSY N/A 03/16/2024   Procedure: ENDOMYOCARDIAL BIOPSY;  Surgeon: Zenaida Morene PARAS, MD;  Location: [facility] INVASIVE CV LAB;  Service: Cardiovascular;  Laterality: N/A;   RIGHT HEART CATH N/A 03/16/2024   Procedure: RIGHT HEART CATH;  Surgeon: Zenaida Morene PARAS, MD;  Location: [facility] INVASIVE CV LAB;  Service: Cardiovascular;  Laterality: N/A;   TOOTH EXTRACTION  07/06/2020    Family History  Problem Relation Age of Onset   Alcohol abuse Father    Hypertension Father    Heart disease Father    Gout Father     Breast cancer Mother    Hypertension Mother    Gout Mother    Healthy Sister    Healthy Daughter    Colon cancer Neg Hx    Esophageal cancer Neg Hx    Rectal cancer Neg Hx    Stomach cancer Neg Hx     No Known Allergies  Current Outpatient Medications on File Prior to Visit  Medication Sig Dispense Refill   allopurinol  (ZYLOPRIM ) 300 MG tablet TAKE 1 TABLET BY MOUTH DAILY 90 tablet 3   apixaban  (ELIQUIS ) 5 MG TABS tablet Take 1 tablet (5 mg total) by mouth 2 (two) times daily. 180 tablet 1  bisoprolol  (ZEBETA ) 10 MG tablet TAKE 2 TABLETS BY MOUTH DAILY (Patient taking differently: Take 20 mg by mouth in the morning.) 180 tablet 3   blood glucose meter kit and supplies Dispense based on patient and insurance preference. Use up to four times daily as directed. (FOR ICD-9 250.00, 250.01). 1 each 0   buPROPion  (WELLBUTRIN  XL) 300 MG 24 hr tablet TAKE 1 TABLET BY MOUTH ONCE  DAILY 30 tablet 0   CLEAR EYES NATURAL TEARS 5-6 MG/ML SOLN Place 1 drop into both eyes 3 (three) times daily as needed (for dryness).     colchicine  0.6 MG tablet TAKE 1 TABLET BY MOUTH DAILY  WHEN HAVING FLARES TAKE 1 TABLET BY MOUTH TWICE DAILY (Patient taking differently: Take 0.6 mg by mouth as needed. TAKE 1 TABLET BY MOUTH  DAILY WHEN HAVING FLARES  TAKE 1 TABLET BY MOUTH  TWICE DAILY) 180 tablet 3   empagliflozin  (JARDIANCE ) 10 MG TABS tablet Take 1 tablet (10 mg total) by mouth daily. 30 tablet 1   furosemide  (LASIX ) 20 MG tablet Take 2 tablets (40 mg total) by mouth as needed (For increased swelling, weight gain of 3 LB in 1 day or 5 LB in 1 week). 90 tablet 0   glucose blood (ONETOUCH ULTRA TEST) test strip Use to check blood sugar 2-3 times a day. 300 each 12   Insulin  Pen Needle (B-D UF III MINI PEN NEEDLES) 31G X 5 MM MISC USE 4 TIMES DAILY AFTER MEALS AND AT BEDTIME 100 each 11   Lancets (ONETOUCH ULTRASOFT) lancets Test blood sugars as directed. Dx E11.9 100 each 12   magnesium  oxide (MAG-OX) 400 (240 Mg)  MG tablet TAKE 1 TABLET(400 MG) BY MOUTH DAILY 30 tablet 6   Multiple Vitamins-Minerals (ONE-A-DAY MENS 50+) TABS Take 1 tablet by mouth daily with breakfast.     nicotine  (NICODERM CQ  - DOSED IN MG/24 HR) 7 mg/24hr patch Place 1 patch (7 mg total) onto the skin daily. 28 patch 0   omeprazole  (PRILOSEC) 40 MG capsule TAKE 1 CAPSULE BY MOUTH DAILY (Patient taking differently: Take 40 mg by mouth daily before breakfast.) 90 capsule 3   potassium chloride  SA (KLOR-CON  M) 20 MEQ tablet Take 2 tablets (40 mEq total) by mouth daily as needed (take with lasix  as needed dose). 30 tablet 2   rosuvastatin  (CRESTOR ) 20 MG tablet TAKE 1 TABLET BY MOUTH DAILY 90 tablet 0   thiamine  (VITAMIN B-1) 100 MG tablet Take 1 tablet (100 mg total) by mouth daily. 30 tablet 0   No current facility-administered medications on file prior to visit.    BP (!) 120/100   Pulse 66   Temp 98.3 F (36.8 C) (Oral)   Ht 6' 5 (1.956 m)   Wt 236 lb (107 kg)   SpO2 99%   BMI 27.99 kg/m       Objective:   Physical Exam Vitals and nursing note reviewed.  Constitutional:      Appearance: Normal appearance. He is obese.  Cardiovascular:     Rate and Rhythm: Normal rate and regular rhythm.     Pulses: Normal pulses.     Heart sounds: Normal heart sounds.  Pulmonary:     Effort: Pulmonary effort is normal.     Breath sounds: Normal breath sounds.  Skin:    General: Skin is warm and dry.     Findings: Rash present. Rash is scaling.         Comments: Hyperpigmentated raised scaly  rash on torso and lower back  Neurological:     General: No focal deficit present.     Mental Status: He is alert and oriented to person, place, and time.  Psychiatric:        Mood and Affect: Mood normal.        Behavior: Behavior normal.        Thought Content: Thought content normal.        Judgment: Judgment normal.        Assessment & Plan:  1. Acute on chronic diastolic CHF (congestive heart failure) (HCC) (Primary) -  Reviewed hospital notes, discharge instructions, labs, imaging, and medication changes with the patient.  All questions answered to the best of my ability.  2. AKI (acute kidney injury) - Follow up with Nephrology as directed  Avoid nephrotoxic avgents - Basic Metabolic Panel; Future - Basic Metabolic Panel  3. CKD stage 3a, GFR 45-59 ml/min (HCC) - See above - Basic Metabolic Panel; Future - Basic Metabolic Panel  4. Paroxysmal A-fib (HCC) - SR today.  - Continue with Eliquis   - Basic Metabolic Panel; Future - Basic Metabolic Panel  5. Diabetes mellitus treated with oral medication [facility]) - Per endocrinology  - Basic Metabolic Panel; Future - Basic Metabolic Panel  6. Essential hypertension - Well controlled. No change in medication  - Basic Metabolic Panel; Future - Basic Metabolic Panel  7. Rash - Unsure if rash is related to Jardiance  or irritant while in the hospital. There does appear to be some drying and improvement. Will have him discuss with Cardiology tomorrow on whether he can be switched to alternative agent  - Will send in Vistaril for itching and kenalog cream - hydrOXYzine (VISTARIL) 50 MG capsule; Take 1 capsule (50 mg total) by mouth every 8 (eight) hours as needed.  Dispense: 15 capsule; Refill: 0 - triamcinolone cream (KENALOG) 0.5 %; Apply 1 Application topically 2 (two) times daily.  Dispense: 90 g; Refill: 0  Darleene Shape, NP

## 2024-03-24 ENCOUNTER — Ambulatory Visit (HOSPITAL_COMMUNITY)
Admit: 2024-03-24 | Discharge: 2024-03-24 | Disposition: A | Discharge status: Home / Self Care | Source: Ambulatory Visit | Attending: Internal Medicine | Admitting: Internal Medicine

## 2024-03-24 ENCOUNTER — Encounter (HOSPITAL_COMMUNITY): Payer: Self-pay

## 2024-03-24 VITALS — BP 120/90 | HR 62 | Ht 77.0 in | Wt 239.2 lb

## 2024-03-24 DIAGNOSIS — N1831 Chronic kidney disease, stage 3a: Secondary | ICD-10-CM | POA: Insufficient documentation

## 2024-03-24 DIAGNOSIS — M109 Gout, unspecified: Secondary | ICD-10-CM | POA: Insufficient documentation

## 2024-03-24 DIAGNOSIS — Z7984 Long term (current) use of oral hypoglycemic drugs: Secondary | ICD-10-CM | POA: Insufficient documentation

## 2024-03-24 DIAGNOSIS — E859 Amyloidosis, unspecified: Secondary | ICD-10-CM | POA: Diagnosis not present

## 2024-03-24 DIAGNOSIS — Z7901 Long term (current) use of anticoagulants: Secondary | ICD-10-CM | POA: Diagnosis not present

## 2024-03-24 DIAGNOSIS — E1122 Type 2 diabetes mellitus with diabetic chronic kidney disease: Secondary | ICD-10-CM | POA: Diagnosis not present

## 2024-03-24 DIAGNOSIS — F17290 Nicotine dependence, other tobacco product, uncomplicated: Secondary | ICD-10-CM | POA: Diagnosis not present

## 2024-03-24 DIAGNOSIS — F1010 Alcohol abuse, uncomplicated: Secondary | ICD-10-CM | POA: Insufficient documentation

## 2024-03-24 DIAGNOSIS — I10 Essential (primary) hypertension: Secondary | ICD-10-CM | POA: Diagnosis not present

## 2024-03-24 DIAGNOSIS — I5022 Chronic systolic (congestive) heart failure: Secondary | ICD-10-CM | POA: Insufficient documentation

## 2024-03-24 DIAGNOSIS — E785 Hyperlipidemia, unspecified: Secondary | ICD-10-CM | POA: Insufficient documentation

## 2024-03-24 DIAGNOSIS — I130 Hypertensive heart and chronic kidney disease with heart failure and stage 1 through stage 4 chronic kidney disease, or unspecified chronic kidney disease: Secondary | ICD-10-CM | POA: Insufficient documentation

## 2024-03-24 DIAGNOSIS — Z794 Long term (current) use of insulin: Secondary | ICD-10-CM | POA: Diagnosis not present

## 2024-03-24 DIAGNOSIS — F1211 Cannabis abuse, in remission: Secondary | ICD-10-CM | POA: Insufficient documentation

## 2024-03-24 DIAGNOSIS — I480 Paroxysmal atrial fibrillation: Secondary | ICD-10-CM | POA: Diagnosis not present

## 2024-03-24 DIAGNOSIS — M069 Rheumatoid arthritis, unspecified: Secondary | ICD-10-CM | POA: Insufficient documentation

## 2024-03-24 DIAGNOSIS — I081 Rheumatic disorders of both mitral and tricuspid valves: Secondary | ICD-10-CM | POA: Diagnosis not present

## 2024-03-24 DIAGNOSIS — F1011 Alcohol abuse, in remission: Secondary | ICD-10-CM

## 2024-03-24 DIAGNOSIS — I3139 Other pericardial effusion (noninflammatory): Secondary | ICD-10-CM | POA: Diagnosis not present

## 2024-03-24 DIAGNOSIS — Z79899 Other long term (current) drug therapy: Secondary | ICD-10-CM | POA: Insufficient documentation

## 2024-03-24 DIAGNOSIS — Z91148 Patient's other noncompliance with medication regimen for other reason: Secondary | ICD-10-CM | POA: Diagnosis not present

## 2024-03-24 DIAGNOSIS — F1210 Cannabis abuse, uncomplicated: Secondary | ICD-10-CM

## 2024-03-24 DIAGNOSIS — Z8673 Personal history of transient ischemic attack (TIA), and cerebral infarction without residual deficits: Secondary | ICD-10-CM | POA: Insufficient documentation

## 2024-03-24 LAB — BRAIN NATRIURETIC PEPTIDE: B Natriuretic Peptide: 1093.8 pg/mL — ABNORMAL HIGH (ref 0.0–100.0)

## 2024-03-24 LAB — BASIC METABOLIC PANEL WITH GFR
Anion gap: 11 (ref 5–15)
BUN: 31 mg/dL — ABNORMAL HIGH (ref 6–20)
CO2: 25 mmol/L (ref 22–32)
Calcium: 9.3 mg/dL (ref 8.9–10.3)
Chloride: 102 mmol/L (ref 98–111)
Creatinine, Ser: 2.47 mg/dL — ABNORMAL HIGH (ref 0.61–1.24)
GFR, Estimated: 30 mL/min — ABNORMAL LOW (ref >60)
Glucose, Bld: 94 mg/dL (ref 70–99)
Potassium: 4.3 mmol/L (ref 3.5–5.1)
Sodium: 138 mmol/L (ref 135–145)

## 2024-03-24 LAB — CBC
HCT: 35.7 % — ABNORMAL LOW (ref 39.0–52.0)
Hemoglobin: 11.4 g/dL — ABNORMAL LOW (ref 13.0–17.0)
MCH: 29.5 pg (ref 26.0–34.0)
MCHC: 31.9 g/dL (ref 30.0–36.0)
MCV: 92.2 fL (ref 80.0–100.0)
Platelets: 355 K/uL (ref 150–400)
RBC: 3.87 MIL/uL — ABNORMAL LOW (ref 4.22–5.81)
RDW: 13.9 % (ref 11.5–15.5)
WBC: 5.7 K/uL (ref 4.0–10.5)
nRBC: 0 % (ref 0.0–0.2)

## 2024-03-24 NOTE — Patient Instructions (Signed)
 ONLY TAKE LASIX  AS NEEDED FOR WEIGHT GAIN OF 3 LB IN 24 HOURS OR 5 LB IN A WEEK.  Labs done today, your results will be available in MyChart, we will contact you for abnormal readings.  Genetic testing has been collected, this has to be sent to [place]  for processing and can take 1-2 weeks for us  to get results back.  We will let you know the results once reviewed by your provider.  Your physician recommends that you schedule a follow-up appointment in: 4 weeks.  If you have any questions or concerns before your next appointment please send us  a message through [place] or call our office at [phone].    TO LEAVE A MESSAGE FOR THE NURSE SELECT OPTION 2, PLEASE LEAVE A MESSAGE INCLUDING: YOUR NAME DATE OF BIRTH CALL BACK NUMBER REASON FOR CALL**this is important as we prioritize the call backs  YOU WILL RECEIVE A CALL BACK THE SAME DAY AS LONG AS YOU CALL BEFORE 4:00 PM  At the [facility], you and your health needs are our priority. As part of our continuing mission to provide you with exceptional heart care, we have created designated Provider Care Teams. These Care Teams include your primary Cardiologist (physician) and Advanced Practice Providers (APPs- Physician Assistants and Nurse Practitioners) who all work together to provide you with the care you need, when you need it.   You may see any of the following providers on your designated Care Team at your next follow up: Dr Toribio Fuel Dr Ezra Shuck Dr. Ria Commander Dr. Morene Brownie Amy Lenetta, NP Caffie Shed, [place] [facility] [place], [place] Beckey Coe, NP [place] Lee, NP Ellouise Class, NP Tinnie Redman, PharmD Jaun Bash, PharmD   Please be sure to bring in all your medications bottles to every appointment.    Thank you for choosing [facility]

## 2024-03-24 NOTE — Progress Notes (Signed)
 Cardiomyopathy  genetic testing collected via blood per Dr Bruce Caper.  Order form completed, signed and shipped with sample by Fed[employer] to [facility].

## 2024-03-25 ENCOUNTER — Encounter (HOSPITAL_COMMUNITY)

## 2024-03-25 ENCOUNTER — Ambulatory Visit (HOSPITAL_COMMUNITY): Payer: Self-pay | Admitting: Internal Medicine

## 2024-03-25 NOTE — Telephone Encounter (Signed)
 Pt aware.

## 2024-04-05 ENCOUNTER — Other Ambulatory Visit: Payer: Self-pay | Admitting: Nurse Practitioner

## 2024-04-08 ENCOUNTER — Other Ambulatory Visit (HOSPITAL_COMMUNITY): Payer: Self-pay

## 2024-04-08 ENCOUNTER — Ambulatory Visit (HOSPITAL_COMMUNITY): Attending: Internal Medicine

## 2024-04-08 DIAGNOSIS — I480 Paroxysmal atrial fibrillation: Secondary | ICD-10-CM

## 2024-04-08 DIAGNOSIS — I5032 Chronic diastolic (congestive) heart failure: Secondary | ICD-10-CM | POA: Insufficient documentation

## 2024-04-08 MED ORDER — APIXABAN 5 MG PO TABS: 5.0000 mg | ORAL_TABLET | Freq: Two times a day (BID) | ORAL | 1 refills | Status: DC

## 2024-04-08 MED ORDER — ROSUVASTATIN CALCIUM 20 MG PO TABS: 20.0000 mg | ORAL_TABLET | Freq: Every day | ORAL | 3 refills | Status: DC

## 2024-04-08 MED ORDER — EMPAGLIFLOZIN 10 MG PO TABS: 10.0000 mg | ORAL_TABLET | Freq: Every day | ORAL | 11 refills | Status: DC

## 2024-04-08 MED ORDER — BISOPROLOL FUMARATE 10 MG PO TABS: 20.0000 mg | ORAL_TABLET | Freq: Every day | ORAL | 3 refills | Status: DC

## 2024-04-08 MED ORDER — POTASSIUM CHLORIDE CRYS ER 20 MEQ PO TBCR: 40.0000 meq | EXTENDED_RELEASE_TABLET | Freq: Every day | ORAL | 2 refills | Status: DC | PRN

## 2024-04-08 MED ORDER — FUROSEMIDE 20 MG PO TABS: 40.0000 mg | ORAL_TABLET | ORAL | 3 refills | Status: DC | PRN

## 2024-04-09 DIAGNOSIS — I5032 Chronic diastolic (congestive) heart failure: Secondary | ICD-10-CM | POA: Diagnosis not present

## 2024-04-13 ENCOUNTER — Telehealth (HOSPITAL_COMMUNITY): Payer: Self-pay

## 2024-04-13 ENCOUNTER — Other Ambulatory Visit: Payer: Self-pay | Admitting: Adult Health

## 2024-04-13 DIAGNOSIS — F339 Major depressive disorder, recurrent, unspecified: Secondary | ICD-10-CM

## 2024-04-13 NOTE — Telephone Encounter (Signed)
 Advanced [facility] Triage Encounter  Patient Name: Keith Leblanc  Date of Call: 04/13/24  Problem:  Patient states since having his CPX on 04/09/24 he as been tired and short of breath. Patient states he is short of breath when he's in bed also has to rest when showering. Patient is not able to do much without being short of breath. Patient denies swelling or any weight gain.   Plan:  Sent to provider for further review.   Jasper Ruminski N Curtisha Bendix, CMA

## 2024-04-14 NOTE — Telephone Encounter (Signed)
 Left patient a message to call back.

## 2024-04-15 ENCOUNTER — Other Ambulatory Visit: Payer: Self-pay | Admitting: Adult Health

## 2024-04-16 NOTE — Telephone Encounter (Signed)
 Patient aware and voiced understanding  Would like to have labs repeated at follow up

## 2024-04-16 NOTE — Progress Notes (Incomplete)
 ADVANCED HF CLINIC CONSULT NOTE  Referring Physician: [name], NP Primary Care: [name], NP Primary Cardiologist: None  Chief Complaint: Chronic HFmrEF HPI: Keith Leblanc is a 58 y.o. AAM with DMII, HTN, HLD, hx stroke, CKD, PAF on eliquis , gout and polysubstance abuse (ETOH, cigar, marijuana).   Has had cardiac workup recently with ongoing SOB and constant congestion. Echo 8/25 showed EF 45-50% (drop from 60-65% 5/24) with severe LVH. PYP pursued which was not consistent with amyloid.   Admitted 9/25 with acute HFmrEF. Volume overloaded on admission, diuresed well with IV lasix , down 40lbs at discharge. LHC deferred with CKD. RHC with elevated filling pressures and severely reduced index. cMRI with LVEF 43%, GHK, mod concentric hypertrophy, LVEF 47%, normal L/R atrial size, severe dilatation of PA, 35mm. LGE pattern consistent with cardiac amyloidosis. Underwent cardiac biopsy. GDMT limited by CKD at discharge.   Today he returns for post hospital follow up. Overall feeling good. Denies palpitations, CP, edema, or PND/Orthopnea. Occasional dizziness, typically with brisk movement. No SOB. Appetite good, trying to watch salt intake. Does not eat out much. Weight at home 231 pounds. Taking all medications but does occasionally miss PM dose of Eliquis . Back to working from home. Wants to start walking.   Previously smoked marijuana daily (stopped on admission). Prior heavy ETOH use daily since he was an adult. Previously drinking a pint of tequila a day, also stopped since admission. Works a sedentary job as a Production designer, theatre/television/film for [employer].   Father with hx of CHF.   Cardiac studies reviewed:  cMRI 9/25 with LVEF 43%, GHK, mod concentric hypertrophy, LVEF 47%, normal L/R atrial size, severe dilatation of PA, 35mm. LGE pattern consistent with cardiac amyloidosis.  RHC 9/25: with elevated filling pressures and severely reduced index.  Echo 8/25: EF 45-50%, severe LVH, abnormal RV free  wall strain, ~17%, RV mildly reduced, mildly elevated PASP. LA mod dilated, RA mildly dilated, small pericardial effusion present, trivial MR, mild TR, tricuspid valve abnormally thickened, aortic valve regurgitation trivial.  PYP 8/25 not suggestive of cardiac amyloid.  Echo 5/24: EF 60-65%, mod LVH, RV normal, no MR    Past Medical History:  Diagnosis Date   Blood in stool    bright red blood    Chicken pox    Diabetes mellitus (HCC)    Erectile dysfunction    Gout    Hypertension    Stroke [facility])     Current Outpatient Medications  Medication Sig Dispense Refill   allopurinol  (ZYLOPRIM ) 300 MG tablet TAKE 1 TABLET BY MOUTH DAILY 90 tablet 3   apixaban  (ELIQUIS ) 5 MG TABS tablet Take 1 tablet (5 mg total) by mouth 2 (two) times daily. 180 tablet 1   bisoprolol  (ZEBETA ) 10 MG tablet Take 2 tablets (20 mg total) by mouth daily. 180 tablet 3   blood glucose meter kit and supplies Dispense based on patient and insurance preference. Use up to four times daily as directed. (FOR ICD-9 250.00, 250.01). 1 each 0   buPROPion  (WELLBUTRIN  XL) 300 MG 24 hr tablet TAKE 1 TABLET BY MOUTH ONCE  DAILY 30 tablet 11   CLEAR EYES NATURAL TEARS 5-6 MG/ML SOLN Place 1 drop into both eyes 3 (three) times daily as needed (for dryness).     colchicine  0.6 MG tablet TAKE 1 TABLET BY MOUTH DAILY  WHEN HAVING FLARES TAKE 1 TABLET BY MOUTH TWICE DAILY (Patient taking differently: Take 0.6 mg by mouth as needed. TAKE 1 TABLET BY MOUTH  DAILY  WHEN HAVING FLARES  TAKE 1 TABLET BY MOUTH  TWICE DAILY) 180 tablet 3   empagliflozin  (JARDIANCE ) 10 MG TABS tablet Take 1 tablet (10 mg total) by mouth daily. 30 tablet 11   furosemide  (LASIX ) 20 MG tablet Take 2 tablets (40 mg total) by mouth as needed (For increased swelling, weight gain of 3 LB in 1 day or 5 LB in 1 week). 90 tablet 3   glucose blood (ONETOUCH ULTRA TEST) test strip Use to check blood sugar 2-3 times a day. 300 each 12   hydrOXYzine (VISTARIL) 50 MG capsule  Take 1 capsule (50 mg total) by mouth every 8 (eight) hours as needed. 15 capsule 0   Insulin  Pen Needle (B-D UF III MINI PEN NEEDLES) 31G X 5 MM MISC USE 4 TIMES DAILY AFTER MEALS AND AT BEDTIME 100 each 11   Lancets (ONETOUCH ULTRASOFT) lancets Test blood sugars as directed. Dx E11.9 100 each 12   magnesium  oxide (MAG-OX) 400 (240 Mg) MG tablet TAKE 1 TABLET(400 MG) BY MOUTH DAILY 30 tablet 6   Multiple Vitamins-Minerals (ONE-A-DAY MENS 50+) TABS Take 1 tablet by mouth daily with breakfast.     nicotine  (NICODERM CQ  - DOSED IN MG/24 HR) 7 mg/24hr patch Place 1 patch (7 mg total) onto the skin daily. 28 patch 0   omeprazole  (PRILOSEC) 40 MG capsule TAKE 1 CAPSULE BY MOUTH DAILY (Patient taking differently: Take 40 mg by mouth daily before breakfast.) 90 capsule 3   potassium chloride  (KLOR-CON ) 10 MEQ tablet TAKE 1 TABLET(10 MEQ) BY MOUTH DAILY 90 tablet 0   potassium chloride  SA (KLOR-CON  M) 20 MEQ tablet Take 2 tablets (40 mEq total) by mouth daily as needed (take with lasix  as needed dose). 30 tablet 2   rosuvastatin  (CRESTOR ) 20 MG tablet Take 1 tablet (20 mg total) by mouth daily. 90 tablet 3   thiamine  (VITAMIN B-1) 100 MG tablet Take 1 tablet (100 mg total) by mouth daily. 30 tablet 0   triamcinolone cream (KENALOG) 0.5 % Apply 1 Application topically 2 (two) times daily. 90 g 0   No current facility-administered medications for this visit.    No Known Allergies    Social History   Socioeconomic History   Marital status: Single    Spouse name: Not on file   Number of children: Not on file   Years of education: Not on file   Highest education level: Not on file  Occupational History   Not on file  Tobacco Use   Smoking status: Former    Current packs/day: 0.00    Types: Cigarettes, Cigars    Quit date: 03/10/2024    Years since quitting: 0.1   Smokeless tobacco: Never  Vaping Use   Vaping status: Never Used  Substance and Sexual Activity   Alcohol use: Not Currently    Drug use: Not Currently    Types: Marijuana   Sexual activity: Yes  Other Topics Concern   Not on file  Social History Narrative   Works for [employer]    Not married    One daughter who does not live with him    Likes to gamble, walk in the parks, travel.    Social Drivers of Corporate investment banker Strain: Not on file  Food Insecurity: No Food Insecurity (03/12/2024)   Hunger Vital Sign    Worried About Running Out of Food in the Last Year: Never true    Ran Out of Food in the Last Year:  Never true  Transportation Needs: No Transportation Needs (03/12/2024)   PRAPARE - Administrator, Civil Service (Medical): No    Lack of Transportation (Non-Medical): No  Physical Activity: Not on file  Stress: Not on file  Social Connections: Not on file  Intimate Partner Violence: Not At Risk (03/12/2024)   Humiliation, Afraid, Rape, and Kick questionnaire    Fear of Current or Ex-Partner: No    Emotionally Abused: No    Physically Abused: No    Sexually Abused: No      Family History  Problem Relation Age of Onset   Alcohol abuse Father    Hypertension Father    Heart disease Father    Gout Father    Breast cancer Mother    Hypertension Mother    Gout Mother    Healthy Sister    Healthy Daughter    Colon cancer Neg Hx    Esophageal cancer Neg Hx    Rectal cancer Neg Hx    Stomach cancer Neg Hx     There were no vitals filed for this visit.   PHYSICAL EXAM: General:  well appearing.  No respiratory difficulty. Walked into clinic.  Neck: JVD ~6 cm.  Cor: Regular rate & rhythm. No murmurs. Lungs: clear Extremities: non-pitting BLE edema  Neuro: alert & oriented x 3. Affect pleasant.   ECG: a fib, low voltage EKG 58 bpm (Personally reviewed)     ASSESSMENT & PLAN: Chronic HFmrEF - Echo 8/25: EF 45-50%, severe LVH, abnormal RV free wall strain, ~17%, RV mildly reduced, mildly elevated PASP. LA mod dilated, RA mildly dilated, small pericardial effusion present,  trivial MR, mild TR, tricuspid valve abnormally thickened, aortic valve regurgitation trivial.  - Need to r/o iCM. HTN CM?, ETOH induced CM? , tachy mediated (though rate-controlled a fib here)? Infiltrative heart disease? (Work up negative). Familial? (Father with CHF) - RHC 9/25 with elevated filling pressures and severely reduced index.  cMRI with LVEF 43%, GHK, mod concentric hypertrophy, LVEF 47%, normal L/R atrial size, severe dilatation of PA, 35mm. LGE pattern consistent with cardiac amyloidosis.  - NYHA I-II today - Appears euvolemic on exam. Has been taking lasix  40 mg daily rather than PRN. Change back to PRN today along with the [facility]. Will check labs today, if worrisome for volume will restart lasix /KDUR.  - Continue Jardiance  10 mg daily - GDMT limited with renal function - Will order genetic testing today - Biopsy results negative for amyloid. Plan for CPX, scheduled for tomorrow. Discussed possible need for advanced therapies.    Concern for cardiac amyloid - PYP 8/25 not suggestive of cardiac amyloid.  - low-voltage EKG - Does not report carpal tunnel or frequent back pain - Severe LVH on echo this admission - cMRI consistent with cardiac amyloidosis - K/L chains elevated - MMP no M-Spike - Biopsy results negative for amyloid. The myocytes show cellular e hypertrophy with enlarged, hyperchromatic and irregular nuclei.  These features are most typical of hypertrophic or dilated cardiomyopathy. Plan to send genetic testing today.    PAF - Suspect cause of stroke back in 5/24 - Continue Eliquis  5 mg BID. Denies abnormal bleeding.  - EKG with rate controleld a fib today. Has been noncompliant with Eliquis  at home. Stressed importance of BID dosing today. - Consider DCCV, +/- TEE if he persists with noncompliant with eliquis  at home   HTN - BP stable today.  - Continue current regimen.  - Consider bidil, BP low/stable. No  need at this time   CKD stage IIIa - Baseline SCr ~  1.5 - Up to 3.16 during last admission.  - Last 2.42 - Labs today   ETOH abuse Marijuana abuse - Previously used to drink 1 pint of tequila/day. Smoked marijuana daily - Has not drank or smoked since he was admitted. Congratulated.   Saw PCP yesterday  Follow up with APP in ~4 weeks.   Harlene [facility] AGACNP-BC  04/16/24   [facility] [place] [address] [facility] [place] [address] [phone] (office)

## 2024-04-17 ENCOUNTER — Other Ambulatory Visit: Payer: Self-pay

## 2024-04-17 ENCOUNTER — Emergency Department (HOSPITAL_COMMUNITY)

## 2024-04-17 ENCOUNTER — Encounter (HOSPITAL_COMMUNITY): Payer: Self-pay | Admitting: Pharmacy Technician

## 2024-04-17 ENCOUNTER — Inpatient Hospital Stay (HOSPITAL_COMMUNITY)
Admission: EM | Admit: 2024-04-17 | Discharge: 2024-05-03 | DRG: 377 | Disposition: A | Discharge status: Home / Self Care | Attending: Internal Medicine | Admitting: Internal Medicine

## 2024-04-17 DIAGNOSIS — I5022 Chronic systolic (congestive) heart failure: Secondary | ICD-10-CM | POA: Diagnosis not present

## 2024-04-17 DIAGNOSIS — I422 Other hypertrophic cardiomyopathy: Secondary | ICD-10-CM | POA: Diagnosis present

## 2024-04-17 DIAGNOSIS — R809 Proteinuria, unspecified: Secondary | ICD-10-CM | POA: Diagnosis present

## 2024-04-17 DIAGNOSIS — I959 Hypotension, unspecified: Secondary | ICD-10-CM | POA: Diagnosis present

## 2024-04-17 DIAGNOSIS — I5082 Biventricular heart failure: Secondary | ICD-10-CM | POA: Diagnosis not present

## 2024-04-17 DIAGNOSIS — K2289 Other specified disease of esophagus: Secondary | ICD-10-CM | POA: Diagnosis not present

## 2024-04-17 DIAGNOSIS — F32A Depression, unspecified: Secondary | ICD-10-CM | POA: Diagnosis not present

## 2024-04-17 DIAGNOSIS — I5041 Acute combined systolic (congestive) and diastolic (congestive) heart failure: Secondary | ICD-10-CM

## 2024-04-17 DIAGNOSIS — I2489 Other forms of acute ischemic heart disease: Secondary | ICD-10-CM | POA: Diagnosis not present

## 2024-04-17 DIAGNOSIS — J449 Chronic obstructive pulmonary disease, unspecified: Secondary | ICD-10-CM | POA: Diagnosis not present

## 2024-04-17 DIAGNOSIS — Z8249 Family history of ischemic heart disease and other diseases of the circulatory system: Secondary | ICD-10-CM

## 2024-04-17 DIAGNOSIS — I5043 Acute on chronic combined systolic (congestive) and diastolic (congestive) heart failure: Secondary | ICD-10-CM | POA: Diagnosis not present

## 2024-04-17 DIAGNOSIS — D123 Benign neoplasm of transverse colon: Secondary | ICD-10-CM | POA: Diagnosis not present

## 2024-04-17 DIAGNOSIS — R0602 Shortness of breath: Secondary | ICD-10-CM | POA: Diagnosis not present

## 2024-04-17 DIAGNOSIS — R0600 Dyspnea, unspecified: Secondary | ICD-10-CM | POA: Diagnosis not present

## 2024-04-17 DIAGNOSIS — Z87891 Personal history of nicotine dependence: Secondary | ICD-10-CM

## 2024-04-17 DIAGNOSIS — R140 Abdominal distension (gaseous): Secondary | ICD-10-CM | POA: Diagnosis not present

## 2024-04-17 DIAGNOSIS — K3189 Other diseases of stomach and duodenum: Secondary | ICD-10-CM | POA: Diagnosis not present

## 2024-04-17 DIAGNOSIS — I700 Atherosclerosis of aorta: Secondary | ICD-10-CM | POA: Diagnosis not present

## 2024-04-17 DIAGNOSIS — I480 Paroxysmal atrial fibrillation: Secondary | ICD-10-CM

## 2024-04-17 DIAGNOSIS — Z811 Family history of alcohol abuse and dependence: Secondary | ICD-10-CM

## 2024-04-17 DIAGNOSIS — Z515 Encounter for palliative care: Secondary | ICD-10-CM | POA: Diagnosis not present

## 2024-04-17 DIAGNOSIS — K2971 Gastritis, unspecified, with bleeding: Secondary | ICD-10-CM | POA: Diagnosis not present

## 2024-04-17 DIAGNOSIS — Z01810 Encounter for preprocedural cardiovascular examination: Secondary | ICD-10-CM | POA: Diagnosis not present

## 2024-04-17 DIAGNOSIS — N289 Disorder of kidney and ureter, unspecified: Secondary | ICD-10-CM | POA: Diagnosis not present

## 2024-04-17 DIAGNOSIS — N179 Acute kidney failure, unspecified: Secondary | ICD-10-CM | POA: Diagnosis present

## 2024-04-17 DIAGNOSIS — E876 Hypokalemia: Secondary | ICD-10-CM | POA: Diagnosis present

## 2024-04-17 DIAGNOSIS — K640 First degree hemorrhoids: Secondary | ICD-10-CM | POA: Diagnosis present

## 2024-04-17 DIAGNOSIS — D62 Acute posthemorrhagic anemia: Principal | ICD-10-CM | POA: Diagnosis present

## 2024-04-17 DIAGNOSIS — F1011 Alcohol abuse, in remission: Secondary | ICD-10-CM | POA: Diagnosis present

## 2024-04-17 DIAGNOSIS — N185 Chronic kidney disease, stage 5: Secondary | ICD-10-CM | POA: Diagnosis not present

## 2024-04-17 DIAGNOSIS — I272 Pulmonary hypertension, unspecified: Secondary | ICD-10-CM | POA: Diagnosis not present

## 2024-04-17 DIAGNOSIS — I509 Heart failure, unspecified: Secondary | ICD-10-CM | POA: Diagnosis not present

## 2024-04-17 DIAGNOSIS — I420 Dilated cardiomyopathy: Secondary | ICD-10-CM | POA: Diagnosis present

## 2024-04-17 DIAGNOSIS — K921 Melena: Secondary | ICD-10-CM | POA: Diagnosis not present

## 2024-04-17 DIAGNOSIS — I4819 Other persistent atrial fibrillation: Secondary | ICD-10-CM | POA: Diagnosis present

## 2024-04-17 DIAGNOSIS — Z7984 Long term (current) use of oral hypoglycemic drugs: Secondary | ICD-10-CM

## 2024-04-17 DIAGNOSIS — K5791 Diverticulosis of intestine, part unspecified, without perforation or abscess with bleeding: Secondary | ICD-10-CM | POA: Diagnosis not present

## 2024-04-17 DIAGNOSIS — I5020 Unspecified systolic (congestive) heart failure: Secondary | ICD-10-CM

## 2024-04-17 DIAGNOSIS — D649 Anemia, unspecified: Secondary | ICD-10-CM | POA: Diagnosis not present

## 2024-04-17 DIAGNOSIS — E1122 Type 2 diabetes mellitus with diabetic chronic kidney disease: Secondary | ICD-10-CM | POA: Diagnosis present

## 2024-04-17 DIAGNOSIS — K760 Fatty (change of) liver, not elsewhere classified: Secondary | ICD-10-CM | POA: Diagnosis not present

## 2024-04-17 DIAGNOSIS — K922 Gastrointestinal hemorrhage, unspecified: Secondary | ICD-10-CM | POA: Diagnosis not present

## 2024-04-17 DIAGNOSIS — J9811 Atelectasis: Secondary | ICD-10-CM | POA: Diagnosis not present

## 2024-04-17 DIAGNOSIS — Z7901 Long term (current) use of anticoagulants: Secondary | ICD-10-CM | POA: Diagnosis not present

## 2024-04-17 DIAGNOSIS — G4700 Insomnia, unspecified: Secondary | ICD-10-CM | POA: Diagnosis present

## 2024-04-17 DIAGNOSIS — I5023 Acute on chronic systolic (congestive) heart failure: Secondary | ICD-10-CM

## 2024-04-17 DIAGNOSIS — I493 Ventricular premature depolarization: Secondary | ICD-10-CM | POA: Diagnosis present

## 2024-04-17 DIAGNOSIS — I132 Hypertensive heart and chronic kidney disease with heart failure and with stage 5 chronic kidney disease, or end stage renal disease: Secondary | ICD-10-CM | POA: Diagnosis not present

## 2024-04-17 DIAGNOSIS — I5032 Chronic diastolic (congestive) heart failure: Secondary | ICD-10-CM | POA: Diagnosis not present

## 2024-04-17 DIAGNOSIS — Z803 Family history of malignant neoplasm of breast: Secondary | ICD-10-CM

## 2024-04-17 DIAGNOSIS — N184 Chronic kidney disease, stage 4 (severe): Secondary | ICD-10-CM

## 2024-04-17 DIAGNOSIS — E785 Hyperlipidemia, unspecified: Secondary | ICD-10-CM | POA: Diagnosis not present

## 2024-04-17 DIAGNOSIS — Z7189 Other specified counseling: Secondary | ICD-10-CM | POA: Diagnosis not present

## 2024-04-17 DIAGNOSIS — Z860101 Personal history of adenomatous and serrated colon polyps: Secondary | ICD-10-CM

## 2024-04-17 DIAGNOSIS — I472 Ventricular tachycardia, unspecified: Secondary | ICD-10-CM | POA: Diagnosis not present

## 2024-04-17 DIAGNOSIS — E872 Acidosis, unspecified: Secondary | ICD-10-CM | POA: Diagnosis present

## 2024-04-17 DIAGNOSIS — Z79899 Other long term (current) drug therapy: Secondary | ICD-10-CM

## 2024-04-17 DIAGNOSIS — Z452 Encounter for adjustment and management of vascular access device: Secondary | ICD-10-CM | POA: Diagnosis not present

## 2024-04-17 DIAGNOSIS — M109 Gout, unspecified: Secondary | ICD-10-CM | POA: Diagnosis present

## 2024-04-17 DIAGNOSIS — R3915 Urgency of urination: Secondary | ICD-10-CM | POA: Diagnosis not present

## 2024-04-17 DIAGNOSIS — Z860100 Personal history of colon polyps, unspecified: Secondary | ICD-10-CM | POA: Diagnosis not present

## 2024-04-17 DIAGNOSIS — R9389 Abnormal findings on diagnostic imaging of other specified body structures: Secondary | ICD-10-CM | POA: Diagnosis not present

## 2024-04-17 DIAGNOSIS — I5021 Acute systolic (congestive) heart failure: Secondary | ICD-10-CM | POA: Diagnosis not present

## 2024-04-17 DIAGNOSIS — I517 Cardiomegaly: Secondary | ICD-10-CM | POA: Diagnosis not present

## 2024-04-17 DIAGNOSIS — I110 Hypertensive heart disease with heart failure: Secondary | ICD-10-CM | POA: Diagnosis not present

## 2024-04-17 DIAGNOSIS — R188 Other ascites: Secondary | ICD-10-CM | POA: Diagnosis not present

## 2024-04-17 DIAGNOSIS — E875 Hyperkalemia: Secondary | ICD-10-CM | POA: Diagnosis not present

## 2024-04-17 DIAGNOSIS — Z8673 Personal history of transient ischemic attack (TIA), and cerebral infarction without residual deficits: Secondary | ICD-10-CM

## 2024-04-17 DIAGNOSIS — K2970 Gastritis, unspecified, without bleeding: Secondary | ICD-10-CM

## 2024-04-17 DIAGNOSIS — J90 Pleural effusion, not elsewhere classified: Secondary | ICD-10-CM | POA: Diagnosis not present

## 2024-04-17 DIAGNOSIS — K2100 Gastro-esophageal reflux disease with esophagitis, without bleeding: Secondary | ICD-10-CM | POA: Diagnosis not present

## 2024-04-17 DIAGNOSIS — N1832 Chronic kidney disease, stage 3b: Secondary | ICD-10-CM | POA: Diagnosis not present

## 2024-04-17 DIAGNOSIS — R531 Weakness: Secondary | ICD-10-CM

## 2024-04-17 LAB — BASIC METABOLIC PANEL WITH GFR
Anion gap: 11 (ref 5–15)
BUN: 57 mg/dL — ABNORMAL HIGH (ref 6–20)
CO2: 21 mmol/L — ABNORMAL LOW (ref 22–32)
Calcium: 9.1 mg/dL (ref 8.9–10.3)
Chloride: 105 mmol/L (ref 98–111)
Creatinine, Ser: 3.13 mg/dL — ABNORMAL HIGH (ref 0.61–1.24)
GFR, Estimated: 22 mL/min — ABNORMAL LOW (ref >60)
Glucose, Bld: 174 mg/dL — ABNORMAL HIGH (ref 70–99)
Potassium: 4.4 mmol/L (ref 3.5–5.1)
Sodium: 137 mmol/L (ref 135–145)

## 2024-04-17 LAB — CBC
HCT: 24.8 % — ABNORMAL LOW (ref 39.0–52.0)
Hemoglobin: 7.5 g/dL — ABNORMAL LOW (ref 13.0–17.0)
MCH: 27 pg (ref 26.0–34.0)
MCHC: 30.2 g/dL (ref 30.0–36.0)
MCV: 89.2 fL (ref 80.0–100.0)
Platelets: 465 K/uL — ABNORMAL HIGH (ref 150–400)
RBC: 2.78 MIL/uL — ABNORMAL LOW (ref 4.22–5.81)
RDW: 15.1 % (ref 11.5–15.5)
WBC: 8 K/uL (ref 4.0–10.5)
nRBC: 1.4 % — ABNORMAL HIGH (ref 0.0–0.2)

## 2024-04-17 LAB — PROTIME-INR
INR: 1.6 — ABNORMAL HIGH (ref 0.8–1.2)
Prothrombin Time: 19.5 s — ABNORMAL HIGH (ref 11.4–15.2)

## 2024-04-17 LAB — BRAIN NATRIURETIC PEPTIDE: B Natriuretic Peptide: 1324 pg/mL — ABNORMAL HIGH (ref 0.0–100.0)

## 2024-04-17 LAB — ABO/RH: ABO/RH(D): O POS

## 2024-04-17 LAB — PREPARE RBC (CROSSMATCH)

## 2024-04-17 LAB — TROPONIN I (HIGH SENSITIVITY)
Troponin I (High Sensitivity): 59 ng/L — ABNORMAL HIGH (ref <18)
Troponin I (High Sensitivity): 54 ng/L — ABNORMAL HIGH (ref <18)

## 2024-04-17 LAB — POC OCCULT BLOOD, ED: Fecal Occult Blood, POC: POSITIVE — AB

## 2024-04-17 LAB — CBG MONITORING, ED: Glucose-Capillary: 124 mg/dL — ABNORMAL HIGH (ref 70–99)

## 2024-04-17 MED ORDER — PANTOPRAZOLE SODIUM 40 MG IV SOLR
40.0000 mg | Freq: Once | INTRAVENOUS | Status: AC
  Administered 2024-04-17: 40 mg via INTRAVENOUS
  Filled 2024-04-17: qty 10

## 2024-04-17 MED ORDER — PANTOPRAZOLE SODIUM 40 MG IV SOLR
40.0000 mg | Freq: Two times a day (BID) | INTRAVENOUS | Status: DC
  Administered 2024-04-18 – 2024-04-20 (×5): 40 mg via INTRAVENOUS
  Filled 2024-04-17 (×5): qty 10

## 2024-04-17 MED ORDER — ACETAMINOPHEN 650 MG RE SUPP: 650.0000 mg | Freq: Four times a day (QID) | RECTAL | Status: DC | PRN

## 2024-04-17 MED ORDER — ROSUVASTATIN CALCIUM 20 MG PO TABS
20.0000 mg | ORAL_TABLET | Freq: Every day | ORAL | Status: DC
  Administered 2024-04-19 – 2024-05-03 (×14): 20 mg via ORAL
  Filled 2024-04-17 (×14): qty 1

## 2024-04-17 MED ORDER — ACETAMINOPHEN 325 MG PO TABS
650.0000 mg | ORAL_TABLET | Freq: Four times a day (QID) | ORAL | Status: DC | PRN
  Administered 2024-04-23 – 2024-04-24 (×3): 650 mg via ORAL
  Filled 2024-04-17 (×4): qty 2

## 2024-04-17 MED ORDER — BUPROPION HCL ER (XL) 150 MG PO TB24
300.0000 mg | ORAL_TABLET | Freq: Every day | ORAL | Status: DC
  Administered 2024-04-19 – 2024-05-03 (×14): 300 mg via ORAL
  Filled 2024-04-17 (×14): qty 2

## 2024-04-17 MED ORDER — ONDANSETRON HCL 4 MG PO TABS
4.0000 mg | ORAL_TABLET | Freq: Four times a day (QID) | ORAL | Status: DC | PRN
  Filled 2024-04-17: qty 1

## 2024-04-17 MED ORDER — LACTATED RINGERS IV BOLUS
500.0000 mL | Freq: Once | INTRAVENOUS | Status: AC
  Administered 2024-04-17: 500 mL via INTRAVENOUS

## 2024-04-17 MED ORDER — APIXABAN 5 MG PO TABS: 5.0000 mg | ORAL_TABLET | Freq: Two times a day (BID) | ORAL | Status: DC

## 2024-04-17 MED ORDER — ONDANSETRON HCL 4 MG/2ML IJ SOLN: 4.0000 mg | Freq: Four times a day (QID) | INTRAMUSCULAR | Status: DC | PRN

## 2024-04-17 MED ORDER — SODIUM CHLORIDE 0.9% IV SOLUTION: Freq: Once | INTRAVENOUS | Status: AC

## 2024-04-17 NOTE — ED Triage Notes (Signed)
 Pt here POV with reports of feeling short of breath for the last few weeks. Reports decreased appetite, fatigue. Hx CHF, recently doubled his dose of lasix  but has not put out more urine. Also endorses dizziness.

## 2024-04-17 NOTE — ED Provider Triage Note (Signed)
 Emergency Medicine Provider Triage Evaluation Note  Keith Leblanc , a 58 y.o. male  was evaluated in triage.  Pt complains of shortness of breath that is worse with exertion.  Patient has a history of heart failure, recently had stress test last week and states that since the stress test he has been experiencing worsening shortness of breath on exertion.  Denies fever, chills, chest pain.  Review of Systems  Positive: Shortness of breath Negative: Fever, chills, chest pain  Physical Exam  BP (!) 108/93 (BP Location: Right Arm)   Pulse (!) 49   Temp 98 F (36.7 C)   Resp 18   SpO2 100%  Gen:   Awake, no distress   Resp:  Normal effort, patient not in acute respiratory distress at time of my encounter, patient not tachypneic, no obvious abnormality with auscultation of heart or lungs MSK:   Moves extremities without difficulty  Other:    Medical Decision Making  Medically screening exam initiated at 5:36 PM.  Appropriate orders placed.  Keith Leblanc was informed that the remainder of the evaluation will be completed by another provider, this initial triage assessment does not replace that evaluation, and the importance of remaining in the ED until their evaluation is complete.  Orders: CBC, BMP, troponin, BNP, chest x-ray, EKG   Keith Leblanc, [place] 04/17/24 1737

## 2024-04-17 NOTE — H&P (Signed)
 History and Physical    Amon Costilla FMW:[id number] DOB: [birth year] DOA: 04/17/2024  PCP: [name], NP   Chief Complaint:  sob  HPI: Keith Leblanc is a 58 y.o. male with medical history significant of prior stroke, hypertension, diabetes who presented to the emergency department due to shortness of breath.  Patient has endorsed progressively worsening shortness of breath over the last week.  He is currently on Eliquis  for A-fib and noticed bright red blood in his stool.  He presented to emergency department was found to be afebrile and hemodynamically stable.  Labs were obtained which showed bicarb 21, creatinine 3.1 baseline around 3, WBC 8.0, hemoglobin 7.5 baseline around 12, troponin 59, 54, INR 1.6.  Patient had chest x-ray which showed no acute changes.  Due to acute anemia reported blood loss he was admitted for further workup.  GI was consulted and patient was started on Protonix .  He was transfused 1 unit of packed RBCs.  His Eliquis  was held.  Of note, he had a colonoscopy in 2019 which showed diverticulosis and multiple polyps.  He denies any NSAID usage.  Review of Systems: Review of Systems  Constitutional: Negative.   HENT: Negative.    Eyes: Negative.   Respiratory:  Positive for shortness of breath.   Cardiovascular: Negative.   Gastrointestinal: Negative.   Genitourinary: Negative.   Musculoskeletal: Negative.   Skin: Negative.   Neurological: Negative.   Endo/Heme/Allergies: Negative.   Psychiatric/Behavioral: Negative.       As per HPI otherwise 10 point review of systems negative.   No Known Allergies  Past Medical History:  Diagnosis Date   Blood in stool    bright red blood    Chicken pox    Diabetes mellitus (HCC)    Erectile dysfunction    Gout    Hypertension    Stroke [facility])     Past Surgical History:  Procedure Laterality Date   APPENDECTOMY  2004   COLONOSCOPY     ENDOMYOCARDIAL BIOPSY N/A 03/16/2024   Procedure: ENDOMYOCARDIAL  BIOPSY;  Surgeon: Zenaida Morene PARAS, MD;  Location: [facility] INVASIVE CV LAB;  Service: Cardiovascular;  Laterality: N/A;   RIGHT HEART CATH N/A 03/16/2024   Procedure: RIGHT HEART CATH;  Surgeon: Zenaida Morene PARAS, MD;  Location[facility] INVASIVE CV LAB;  Service: Cardiovascular;  Laterality: N/A;   TOOTH EXTRACTION  07/06/2020     reports that he quit smoking about 5 weeks ago. His smoking use included cigarettes and cigars. He has never used smokeless tobacco. He reports that he does not currently use alcohol. He reports that he does not currently use drugs after having used the following drugs: Marijuana.  Family History  Problem Relation Age of Onset   Alcohol abuse Father    Hypertension Father    Heart disease Father    Gout Father    Breast cancer Mother    Hypertension Mother    Gout Mother    Healthy Sister    Healthy Daughter    Colon cancer Neg Hx    Esophageal cancer Neg Hx    Rectal cancer Neg Hx    Stomach cancer Neg Hx     Prior to Admission medications   Medication Sig Start Date End Date Taking? Authorizing Provider  apixaban  (ELIQUIS ) 5 MG TABS tablet Take 1 tablet (5 mg total) by mouth 2 (two) times daily. 04/08/24  Yes Hayes Beckey CROME, NP  bisoprolol  (ZEBETA ) 10 MG tablet Take 2 tablets (20 mg total) by mouth  daily. 04/08/24  Yes Hayes Beckey CROME, NP  buPROPion  (WELLBUTRIN  XL) 300 MG 24 hr tablet TAKE 1 TABLET BY MOUTH ONCE  DAILY 04/13/24  Yes Nafziger, Darleene, NP  colchicine  0.6 MG tablet TAKE 1 TABLET BY MOUTH DAILY  WHEN HAVING FLARES TAKE 1 TABLET BY MOUTH TWICE DAILY Patient taking differently: Take 0.6 mg by mouth as needed. TAKE 1 TABLET BY MOUTH  DAILY WHEN HAVING FLARES  TAKE 1 TABLET BY MOUTH  TWICE DAILY 09/05/23  Yes Nafziger, Darleene, NP  empagliflozin  (JARDIANCE ) 10 MG TABS tablet Take 1 tablet (10 mg total) by mouth daily. 04/08/24  Yes Hayes Beckey CROME, NP  furosemide  (LASIX ) 20 MG tablet Take 2 tablets (40 mg total) by mouth as needed (For increased swelling, weight gain  of 3 LB in 1 day or 5 LB in 1 week). 04/08/24  Yes Hayes Beckey CROME, NP  hydrOXYzine (VISTARIL) 50 MG capsule Take 1 capsule (50 mg total) by mouth every 8 (eight) hours as needed. Patient taking differently: Take 50 mg by mouth every 8 (eight) hours as needed for itching. 03/23/24  Yes Nafziger, Darleene, NP  magnesium  oxide (MAG-OX) 400 (240 Mg) MG tablet TAKE 1 TABLET(400 MG) BY MOUTH DAILY 02/12/24  Yes West, Katlyn D, NP  Multiple Vitamins-Minerals (ONE-A-DAY MENS 50+) TABS Take 1 tablet by mouth daily with breakfast.   Yes [provider]  omeprazole  (PRILOSEC) 40 MG capsule TAKE 1 CAPSULE BY MOUTH DAILY Patient taking differently: Take 40 mg by mouth daily before breakfast. 11/12/23  Yes Nafziger, Darleene, NP  potassium chloride  SA (KLOR-CON  M) 20 MEQ tablet Take 2 tablets (40 mEq total) by mouth daily as needed (take with lasix  as needed dose). 04/08/24  Yes Hayes Beckey CROME, NP  rosuvastatin  (CRESTOR ) 20 MG tablet Take 1 tablet (20 mg total) by mouth daily. 04/08/24  Yes Hayes Beckey CROME, NP  thiamine  (VITAMIN B-1) 100 MG tablet Take 1 tablet (100 mg total) by mouth daily. 11/11/22  Yes Toberman, Stevi W, NP  triamcinolone cream (KENALOG) 0.5 % Apply 1 Application topically 2 (two) times daily. 03/23/24  Yes Nafziger, Darleene, NP  blood glucose meter kit and supplies Dispense based on patient and insurance preference. Use up to four times daily as directed. (FOR ICD-9 250.00, 250.01). 07/09/16   Sebastian Toribio GAILS, MD  glucose blood (ONETOUCH ULTRA TEST) test strip Use to check blood sugar 2-3 times a day. 03/01/24   Trixie File, MD  Insulin  Pen Needle (B-D UF III MINI PEN NEEDLES) 31G X 5 MM MISC USE 4 TIMES DAILY AFTER MEALS AND AT BEDTIME 02/24/20   Trixie File, MD  Lancets [facility] ULTRASOFT) lancets Test blood sugars as directed. Dx E11.9 08/06/16   Nafziger, Cory, NP  nicotine  (NICODERM CQ  - DOSED IN MG/24 HR) 7 mg/24hr patch Place 1 patch (7 mg total) onto the skin daily. Patient not taking:  Reported on 04/17/2024 03/19/24   Fairy Frames, MD  potassium chloride  (KLOR-CON ) 10 MEQ tablet TAKE 1 TABLET(10 MEQ) BY MOUTH DAILY Patient not taking: Reported on 04/17/2024 04/08/24   Hayes Beckey CROME, NP    Physical Exam: Vitals:   04/17/24 1816 04/17/24 2000 04/17/24 2100 04/17/24 2200  BP: (!) 92/54 90/68 (!) 87/62 (!) 85/61  Pulse: (!) 52 (!) 54 (!) 52 (!) 54  Resp:  18 15 17   Temp:      SpO2:  100% 100% 100%   Physical Exam Constitutional:      Appearance: He is normal weight.  HENT:     Head: Normocephalic.     Mouth/Throat:     Mouth: Mucous membranes are moist.     Pharynx: Oropharynx is clear.  Eyes:     Conjunctiva/sclera: Conjunctivae normal.     Pupils: Pupils are equal, round, and reactive to light.  Cardiovascular:     Rate and Rhythm: Normal rate and regular rhythm.     Pulses: Normal pulses.     Heart sounds: Normal heart sounds.  Pulmonary:     Effort: Pulmonary effort is normal.     Breath sounds: Normal breath sounds.  Abdominal:     General: Abdomen is flat. Bowel sounds are normal.  Musculoskeletal:        General: Normal range of motion.     Cervical back: Normal range of motion.  Skin:    Capillary Refill: Capillary refill takes less than 2 seconds.  Neurological:     General: No focal deficit present.     Mental Status: He is alert.  Psychiatric:        Mood and Affect: Mood normal.        Labs on Admission: I have personally reviewed the patients's labs and imaging studies.  Assessment/Plan Principal Problem:   GI bleed   Acute anemia most likely secondary to lower GI bleed - Patient endorses hematochezia however noted to have melena in the emergency department - Hemoglobin found to be 7 from baseline around 12 - Patient endorsing shortness of breath  Plan: Trend hemoglobin every 8 hours Placed on twice daily PPI Clear liquid diet Appreciate GI consultation  # Depression-continue bupropion   # Hyperlipidemia-continue  rosuvastatin   # Paroxysmal A-fib-hold Eliquis   # History of heart failure with reduced ejection fraction-there was concern for nonischemic cardiomyopathy.  Patient has been on goal-directed medical therapy.  # Hypertension-patient is on numerous blood pressure medications at home.  Will hold in setting of borderline hypotension  # CKD stage IIIb-trend creatinine     Admission status: Inpatient Med-Surg  Certification: The appropriate patient status for this patient is INPATIENT. Inpatient status is judged to be reasonable and necessary in order to provide the required intensity of service to ensure the patient's safety. The patient's presenting symptoms, physical exam findings, and initial radiographic and laboratory data in the context of their chronic comorbidities is felt to place them at high risk for further clinical deterioration. Furthermore, it is not anticipated that the patient will be medically stable for discharge from the hospital within 2 midnights of admission.   * I certify that at the point of admission it is my clinical judgment that the patient will require inpatient hospital care spanning beyond 2 midnights from the point of admission due to high intensity of service, high risk for further deterioration and high frequency of surveillance required.DEWAINE Lamar Dess MD [facility] If 7PM-7AM, please contact night-coverage [email or web address]  04/17/2024, 11:42 PM   \

## 2024-04-17 NOTE — ED Provider Notes (Signed)
 [place] [facility]PARTMENT AT [facility] Provider Note   CSN: [id number] Arrival date & time: 04/17/24  1619     History  No chief complaint on file.   Keith Leblanc is a 58 y.o. male with diastolic heart failure, HTN, HLD, pAF T2DM, gout, history of CVA, history of blood in stool, history of alcohol use disorder, tobacco use who presents with shortness of breath that is worse with exertion, worse over the last week.  Patient has a history of heart failure, recently had stress test last week and states that since the stress test he has been experiencing worsening shortness of breath on exertion.  Denies fever, chills, chest pain, cough, leg swelling. Takes eliquis  for Afib, last dose was this afternoon. Does have h/o rectal bleeding but it was years ago. States he has some rectal bleeding from time to time.    Past Medical History:  Diagnosis Date   Blood in stool    bright red blood    Chicken pox    Diabetes mellitus (HCC)    Erectile dysfunction    Gout    Hypertension    Stroke [facility])        Home Medications Prior to Admission medications   Medication Sig Start Date End Date Taking? Authorizing Provider  apixaban  (ELIQUIS ) 5 MG TABS tablet Take 1 tablet (5 mg total) by mouth 2 (two) times daily. 04/08/24  Yes Hayes Beckey CROME, NP  bisoprolol  (ZEBETA ) 10 MG tablet Take 2 tablets (20 mg total) by mouth daily. 04/08/24  Yes Hayes Beckey CROME, NP  buPROPion  (WELLBUTRIN  XL) 300 MG 24 hr tablet TAKE 1 TABLET BY MOUTH ONCE  DAILY 04/13/24  Yes Nafziger, Darleene, NP  colchicine  0.6 MG tablet TAKE 1 TABLET BY MOUTH DAILY  WHEN HAVING FLARES TAKE 1 TABLET BY MOUTH TWICE DAILY Patient taking differently: Take 0.6 mg by mouth as needed. TAKE 1 TABLET BY MOUTH  DAILY WHEN HAVING FLARES  TAKE 1 TABLET BY MOUTH  TWICE DAILY 09/05/23  Yes Nafziger, Darleene, NP  empagliflozin  (JARDIANCE ) 10 MG TABS tablet Take 1 tablet (10 mg total) by mouth daily. 04/08/24  Yes Hayes Beckey CROME, NP   furosemide  (LASIX ) 20 MG tablet Take 2 tablets (40 mg total) by mouth as needed (For increased swelling, weight gain of 3 LB in 1 day or 5 LB in 1 week). 04/08/24  Yes Hayes Beckey CROME, NP  hydrOXYzine (VISTARIL) 50 MG capsule Take 1 capsule (50 mg total) by mouth every 8 (eight) hours as needed. Patient taking differently: Take 50 mg by mouth every 8 (eight) hours as needed for itching. 03/23/24  Yes Nafziger, Darleene, NP  magnesium  oxide (MAG-OX) 400 (240 Mg) MG tablet TAKE 1 TABLET(400 MG) BY MOUTH DAILY 02/12/24  Yes West, Katlyn D, NP  Multiple Vitamins-Minerals (ONE-A-DAY MENS 50+) TABS Take 1 tablet by mouth daily with breakfast.   Yes [provider]  omeprazole  (PRILOSEC) 40 MG capsule TAKE 1 CAPSULE BY MOUTH DAILY Patient taking differently: Take 40 mg by mouth daily before breakfast. 11/12/23  Yes Nafziger, Darleene, NP  potassium chloride  SA (KLOR-CON  M) 20 MEQ tablet Take 2 tablets (40 mEq total) by mouth daily as needed (take with lasix  as needed dose). 04/08/24  Yes Hayes Beckey CROME, NP  rosuvastatin  (CRESTOR ) 20 MG tablet Take 1 tablet (20 mg total) by mouth daily. 04/08/24  Yes Hayes Beckey CROME, NP  thiamine  (VITAMIN B-1) 100 MG tablet Take 1 tablet (100 mg total) by mouth  daily. 11/11/22  Yes Toberman, Stevi W, NP  triamcinolone cream (KENALOG) 0.5 % Apply 1 Application topically 2 (two) times daily. 03/23/24  Yes Nafziger, Darleene, NP  blood glucose meter kit and supplies Dispense based on patient and insurance preference. Use up to four times daily as directed. (FOR ICD-9 250.00, 250.01). 07/09/16   Sebastian Toribio GAILS, MD  glucose blood (ONETOUCH ULTRA TEST) test strip Use to check blood sugar 2-3 times a day. 03/01/24   Trixie File, MD  Insulin  Pen Needle (B-D UF III MINI PEN NEEDLES) 31G X 5 MM MISC USE 4 TIMES DAILY AFTER MEALS AND AT BEDTIME 02/24/20   Trixie File, MD  Lancets [facility] ULTRASOFT) lancets Test blood sugars as directed. Dx E11.9 08/06/16   Nafziger, Cory, NP  nicotine   (NICODERM CQ  - DOSED IN MG/24 HR) 7 mg/24hr patch Place 1 patch (7 mg total) onto the skin daily. Patient not taking: Reported on 04/17/2024 03/19/24   Fairy Frames, MD  potassium chloride  (KLOR-CON ) 10 MEQ tablet TAKE 1 TABLET(10 MEQ) BY MOUTH DAILY Patient not taking: Reported on 04/17/2024 04/08/24   Hayes Beckey CROME, NP      Allergies    Patient has no known allergies.    Review of Systems   Review of Systems A 10 point review of systems was performed and is negative unless otherwise reported in HPI.  Physical Exam Updated Vital Signs BP (!) 85/61   Pulse (!) 54   Temp 97.9 F (36.6 C)   Resp 17   SpO2 100%  Physical Exam General: Normal appearing male, lying in bed.  HEENT: PERRLA, Sclera anicteric, MMM, trachea midline.  Cardiology: RRR, no murmurs/rubs/gallops.  Resp: Normal respiratory rate and effort. CTAB, no wheezes, rhonchi, crackles.  Abd: Soft, non-tender, non-distended. No rebound tenderness or guarding.  Rectal: Performed with nurse chaperone.  Normal-appearing external sphincter with no gross blood.  Digital rectal exam with no abnormalities palpated.  Black stool on glove. MSK: No peripheral edema or signs of trauma. Extremities without deformity or TTP. No cyanosis or clubbing. Skin: warm, dry.  Back: No CVA tenderness Neuro: A&Ox4, CNs II-XII grossly intact. MAEs. Sensation grossly intact.  Psych: Normal mood and affect.   ED Results / Procedures / Treatments   Labs (all labs ordered are listed, but only abnormal results are displayed) Labs Reviewed  BASIC METABOLIC PANEL WITH GFR - Abnormal; Notable for the following components:      Result Value   CO2 21 (*)    Glucose, Bld 174 (*)    BUN 57 (*)    Creatinine, Ser 3.13 (*)    GFR, Estimated 22 (*)    All other components within normal limits  CBC - Abnormal; Notable for the following components:   RBC 2.78 (*)    Hemoglobin 7.5 (*)    HCT 24.8 (*)    Platelets 465 (*)    nRBC 1.4 (*)    All  other components within normal limits  BRAIN NATRIURETIC PEPTIDE - Abnormal; Notable for the following components:   B Natriuretic Peptide 1,324.0 (*)    All other components within normal limits  POC OCCULT BLOOD, ED - Abnormal; Notable for the following components:   Fecal Occult Blood, POC Positive (*)    All other components within normal limits  CBG MONITORING, ED - Abnormal; Notable for the following components:   Glucose-Capillary 124 (*)    All other components within normal limits  TROPONIN I (HIGH SENSITIVITY) - Abnormal; Notable for  the following components:   Troponin I (High Sensitivity) 59 (*)    All other components within normal limits  TROPONIN I (HIGH SENSITIVITY) - Abnormal; Notable for the following components:   Troponin I (High Sensitivity) 54 (*)    All other components within normal limits  PROTIME-INR  ABO/RH  TYPE AND SCREEN  PREPARE RBC (CROSSMATCH)    EKG EKG Interpretation Date/Time:  Saturday April 17 2024 16:37:38 EDT Ventricular Rate:  68 PR Interval:    QRS Duration:  82 QT Interval:  424 QTC Calculation: 450 R Axis:   -67  Text Interpretation: Accelerated Junctional rhythm with frequent Premature ventricular complexes Left axis deviation Inferior infarct , age undetermined Anterior infarct , age undetermined Abnormal ECG When compared with ECG of 24-Mar-2024 16:05, Premature ventricular complexes are now present Confirmed by Raford Lenis ([id number]) on 04/17/2024 10:52:01 PM  Radiology DG Chest 2 View Result Date: 04/17/2024 CLINICAL DATA:  Shortness of breath. EXAM: DG CHEST 2V COMPARISON:  Radiograph 03/12/2024 FINDINGS: Mild cardiomegaly, stable from prior. Improved right lung base aeration with decreased elevation of hemidiaphragm and opacity. Minor subsegmental atelectasis at the left lung base. No pulmonary edema. No pneumothorax. No significant pleural effusion. IMPRESSION: 1. Improved right lung base aeration with decreased elevation of  hemidiaphragm and opacity. 2. Minor subsegmental atelectasis at the left lung base. Electronically Signed   By: Andrea Gasman M.D.   On: 04/17/2024 18:04    Procedures .Critical Care  Performed by: Franklyn Sid SAILOR, MD Authorized by: Franklyn Sid SAILOR, MD   Critical care provider statement:    Critical care time (minutes):  40   Critical care was necessary to treat or prevent imminent or life-threatening deterioration of the following conditions:  Circulatory failure   Critical care was time spent personally by me on the following activities:  Development of treatment plan with patient or surrogate, discussions with consultants, evaluation of patient's response to treatment, examination of patient, ordering and review of laboratory studies, ordering and review of radiographic studies, ordering and performing treatments and interventions, pulse oximetry, re-evaluation of patient's condition, review of old charts and obtaining history from patient or surrogate   Care discussed with: admitting provider       Medications Ordered in ED Medications  0.9 %  sodium chloride  infusion (Manually program via Guardrails IV Fluids) (0 mLs Intravenous Hold 04/17/24 2300)  lactated ringers  bolus 500 mL (500 mLs Intravenous New Bag/Given 04/17/24 2259)  pantoprazole  (PROTONIX ) injection 40 mg (40 mg Intravenous Given 04/17/24 2259)    ED Course/ Medical Decision Making/ A&P                          Medical Decision Making Amount and/or Complexity of Data Reviewed Labs: ordered. Decision-making details documented in ED Course. Radiology: ordered. Decision-making details documented in ED Course.  Risk Prescription drug management. Decision regarding hospitalization.    This patient presents to the ED for concern of dyspnea, generalized weakness, this involves an extensive number of treatment options, and is a complaint that carries with it a high risk of complications and morbidity.  I considered  the following differential and admission for this acute, potentially life threatening condition.  Patient with systolic blood pressure in the 90s and 100s.  Afebrile.  MDM:    DDX for dyspnea includes but is not limited to:  Cardiac- CHF, Myocardial Ischemia, Valvular heart disease Respiratory -pneumonia / atelectasis / pulmonary effusion, Pneumothorax, PE Other - Sepsis, Anemia  Patient is noted to have a new anemia down from 11.4-->7.5.  This is likely the source of his dyspnea.  He does have a history of heart failure and was recently admitted for this however his chest x-ray demonstrates improved aeration actually and he has clear lung sounds.  His BNP is still elevated though I believe this is less likely the cause of his symptoms, no orthopnea.  He has reported intermittent bright red blood per rectum and melena, on exam today performed with nurse tech chaperone he has melena that is positive for blood.  He has no known liver disease but has a history of alcohol use.  Will treat with IV Protonix  and consult with GI.  His pressures have been in the 90s in the 100 systolic here and he feels orthostatic just sitting up in bed.  Will begin resuscitation with a small fluid bolus but patient will need blood products.  He and his girlfriend at bedside are consented for 1 unit packed red blood cells which are ordered typed.  He has no abdominal pain that would indicate diverticulitis or acute mesenteric ischemia.  He does take a blood thinner for A-fib, Eliquis , last dose was this afternoon and will hold this.  Clinical Course as of 04/17/24 2311  Sat Apr 17, 2024  1859 Creatinine(!): 3.13 +AKI on CKD [HN]  1859 Hemoglobin(!): 7.5 Down from 11.4 three weeks ago [HN]  1900 DG Chest 2 View 1. Improved right lung base aeration with decreased elevation of hemidiaphragm and opacity. 2. Minor subsegmental atelectasis at the left lung base.   [HN]  1900 B Natriuretic Peptide(!): 1,324.0 [HN]  2042  Fecal Occult Blood, POC(!): Positive [HN]  2109 D/w Dr. Legrand w/ gastroenterology who recommends NPO except sips and ice chips, hold eliquis , IV Protonix  (already ordered), and will likely need EGD tomorrow. [HN]    Clinical Course User Index [HN] Franklyn Sid SAILOR, MD    Labs: I Ordered, and personally interpreted labs.  The pertinent results include:  those listed above  Imaging Studies ordered: I ordered imaging studies including CXR I independently visualized and interpreted imaging. I agree with the radiologist interpretation  Additional history obtained from chart review, girlfriend at bedside.    Cardiac Monitoring: The patient was maintained on a cardiac monitor.  I personally viewed and interpreted the cardiac monitored which showed an underlying rhythm of: sinus bradycardia  Reevaluation: After the interventions noted above, I reevaluated the patient and found that they have :stayed the same  Social Determinants of Health: Lives independently  Disposition: Admit to hospitalist with medicine following  Co morbidities that complicate the patient evaluation  Past Medical History:  Diagnosis Date   Blood in stool    bright red blood    Chicken pox    Diabetes mellitus (HCC)    Erectile dysfunction    Gout    Hypertension    Stroke (HCC)      Medicines Meds ordered this encounter  Medications   lactated ringers  bolus 500 mL   0.9 %  sodium chloride  infusion (Manually program via Guardrails IV Fluids)   pantoprazole  (PROTONIX ) injection 40 mg    I have reviewed the patients home medicines and have made adjustments as needed  Problem List / ED Course: Problem List Items Addressed This Visit   None Visit Diagnoses       Acute on chronic blood loss anemia    -  Primary     Generalized weakness  Dyspnea, unspecified type         AKI (acute kidney injury)                       This note was created using dictation software, which may  contain spelling or grammatical errors.    Franklyn Sid SAILOR, MD 04/17/24 [phone]

## 2024-04-18 DIAGNOSIS — R0600 Dyspnea, unspecified: Secondary | ICD-10-CM | POA: Diagnosis not present

## 2024-04-18 DIAGNOSIS — K921 Melena: Secondary | ICD-10-CM

## 2024-04-18 DIAGNOSIS — D62 Acute posthemorrhagic anemia: Principal | ICD-10-CM

## 2024-04-18 DIAGNOSIS — N184 Chronic kidney disease, stage 4 (severe): Secondary | ICD-10-CM | POA: Diagnosis not present

## 2024-04-18 DIAGNOSIS — I4819 Other persistent atrial fibrillation: Secondary | ICD-10-CM | POA: Diagnosis not present

## 2024-04-18 DIAGNOSIS — K922 Gastrointestinal hemorrhage, unspecified: Secondary | ICD-10-CM | POA: Diagnosis not present

## 2024-04-18 DIAGNOSIS — I5022 Chronic systolic (congestive) heart failure: Secondary | ICD-10-CM

## 2024-04-18 DIAGNOSIS — Z01810 Encounter for preprocedural cardiovascular examination: Secondary | ICD-10-CM

## 2024-04-18 LAB — CBC
HCT: 22.1 % — ABNORMAL LOW (ref 39.0–52.0)
HCT: 25 % — ABNORMAL LOW (ref 39.0–52.0)
Hemoglobin: 6.8 g/dL — CL (ref 13.0–17.0)
Hemoglobin: 7.9 g/dL — ABNORMAL LOW (ref 13.0–17.0)
MCH: 27.2 pg (ref 26.0–34.0)
MCH: 27.9 pg (ref 26.0–34.0)
MCHC: 30.8 g/dL (ref 30.0–36.0)
MCHC: 31.6 g/dL (ref 30.0–36.0)
MCV: 88.4 fL (ref 80.0–100.0)
MCV: 88.3 fL (ref 80.0–100.0)
Platelets: 432 K/uL — ABNORMAL HIGH (ref 150–400)
Platelets: 424 K/uL — ABNORMAL HIGH (ref 150–400)
RBC: 2.5 MIL/uL — ABNORMAL LOW (ref 4.22–5.81)
RBC: 2.83 MIL/uL — ABNORMAL LOW (ref 4.22–5.81)
RDW: 15.4 % (ref 11.5–15.5)
RDW: 14.9 % (ref 11.5–15.5)
WBC: 8.3 K/uL (ref 4.0–10.5)
WBC: 9.2 K/uL (ref 4.0–10.5)
nRBC: 1.2 % — ABNORMAL HIGH (ref 0.0–0.2)
nRBC: 1.5 % — ABNORMAL HIGH (ref 0.0–0.2)

## 2024-04-18 MED ORDER — SODIUM CHLORIDE 0.9 % IV BOLUS
250.0000 mL | INTRAVENOUS | Status: AC
  Administered 2024-04-18 – 2024-04-19 (×2): 250 mL via INTRAVENOUS

## 2024-04-18 MED ORDER — MIDODRINE HCL 5 MG PO TABS
10.0000 mg | ORAL_TABLET | ORAL | Status: AC
  Administered 2024-04-18: 10 mg via ORAL
  Filled 2024-04-18: qty 2

## 2024-04-18 NOTE — Plan of Care (Signed)

## 2024-04-18 NOTE — Progress Notes (Signed)
 Patient refusing to wear SCD's. Ambulates frequently. Provider aware.

## 2024-04-18 NOTE — Progress Notes (Addendum)
 PROGRESS NOTE  Keith Leblanc FMW:[id number] DOB: 1966-05-19 DOA: 04/17/2024 PCP: [name], NP  HPI/Recap of past 24 hours: Keith Leblanc is a 58 y.o. male with medical history significant of prior stroke, hypertension, diabetes who presented to the emergency department due to shortness of breath.  Patient has endorsed progressively worsening shortness of breath over the last week.  He is currently on Eliquis  for A-fib and noticed bright red blood in his stool.  He presented to emergency department was found to be afebrile and hemodynamically stable.  Labs were obtained which showed bicarb 21, creatinine 3.1 baseline around 3, WBC 8.0, hemoglobin 7.5 baseline around 12, troponin 59, 54, INR 1.6.  Patient had chest x-ray which showed no acute changes.  Due to acute anemia reported blood loss he was admitted for further workup.  GI was consulted and patient was started on IV PPI BID.  He was transfused 1 unit of packed RBCs for Hg 6.8.  His Eliquis  was held.   Of note, he had a colonoscopy in 2019 which showed diverticulosis and multiple polyps.    04/18/2024: The patient was seen and examined .  Denies use of NSAIDs.  Last bowel movement was 2 days ago and every time he has a bowel movement he notices blood in his stool.  States his shortness of breath is improved this morning post blood transfusion.  No chest pain.  Hemoglobin 7.9 from 6.8.  Baseline hemoglobin 13.   Assessment/Plan: Principal Problem:   GI bleed Active Problems:   Melena   ABLA (acute blood loss anemia)   Hematochezia  Acute blood loss anemia most likely secondary to lower GI bleed - Patient endorses hematochezia however noted to have melena in the emergency department - Baseline hemoglobin 13. -1 unit PRBCs transfused for hemoglobin of 6.8. -History of LA grade a reflux esophagitis and gastritis seen on EGD done in 2019. - Denies use of NSAIDs - Continue IV PPI twice daily - Appreciate GI and cardiology  assistance   # Chronic depression-continue home bupropion    # Hyperlipidemia-continue home rosuvastatin    # Paroxysmal A-fib-Home Eliquis  on hold.   # History of heart failure with reduced ejection fraction-there was concern for nonischemic cardiomyopathy.  Prior to admission on goal-directed medical therapy.   # Hypertension-patient is on numerous blood pressure medications at home.  Will hold in setting of borderline hypotension.  Continue to closely monitor vital signs.  Maintain MAP greater than 65.   # CKD stage IIIb-continue to trend creatinine.          Time: 50 minutes.    Code Status: Full code.  Family Communication: None at bedside.  Disposition Plan: Likely discharge to home after GI clearance.   Consultants: GI  Procedures: EGD  Antimicrobials: None.  DVT prophylaxis: None.  Status is: Inpatient Inpatient status.  The patient requires at least 2 midnights for further evaluation and treatment of present condition.     Objective: Vitals:   04/18/24 0900 04/18/24 1003 04/18/24 1241 04/18/24 1349  BP: 99/70 (!) 91/59    Pulse: 75 (!) 53    Resp: 15 16    Temp:   98.3 F (36.8 C)   TempSrc:  Oral Oral   SpO2:  100%    Height:    6' 5 (1.956 m)   No intake or output data in the 24 hours ending 04/18/24 1353 There were no vitals filed for this visit.  Exam:  General: 58 y.o. year-old male well developed well  nourished in no acute distress.  Alert and oriented x3. Cardiovascular: Regular rate and rhythm with no rubs or gallops.  No thyromegaly or JVD noted.   Respiratory: Clear to auscultation with no wheezes or rales. Good inspiratory effort. Abdomen: Soft nontender nondistended with normal bowel sounds x4 quadrants. Musculoskeletal: Trace lower extremity edema bilaterally. Skin: No ulcerative lesions noted or rashes, Psychiatry: Mood is appropriate for condition and setting   Data Reviewed: CBC: Recent Labs  Lab 04/17/24 1704  04/18/24 0104 04/18/24 0743  WBC 8.0 8.3 9.2  HGB 7.5* 6.8* 7.9*  HCT 24.8* 22.1* 25.0*  MCV 89.2 88.4 88.3  PLT 465* 432* 424*   Basic Metabolic Panel: Recent Labs  Lab 04/17/24 1704  NA 137  K 4.4  CL 105  CO2 21*  GLUCOSE 174*  BUN 57*  CREATININE 3.13*  CALCIUM  9.1   GFR: CrCl cannot be calculated (Unknown ideal weight.). Liver Function Tests: No results for input(s): AST, ALT, ALKPHOS, BILITOT, PROT, ALBUMIN in the last 168 hours. No results for input(s): LIPASE, AMYLASE in the last 168 hours. No results for input(s): AMMONIA in the last 168 hours. Coagulation Profile: Recent Labs  Lab 04/17/24 2253  INR 1.6*   Cardiac Enzymes: No results for input(s): CKTOTAL, CKMB, CKMBINDEX, TROPONINI in the last 168 hours. BNP (last 3 results) Recent Labs    03/12/24 0942  PROBNP 5,364.0*   HbA1C: No results for input(s): HGBA1C in the last 72 hours. CBG: Recent Labs  Lab 04/17/24 1947  GLUCAP 124*   Lipid Profile: No results for input(s): CHOL, HDL, LDLCALC, TRIG, CHOLHDL, LDLDIRECT in the last 72 hours. Thyroid  Function Tests: No results for input(s): TSH, T4TOTAL, FREET4, T3FREE, THYROIDAB in the last 72 hours. Anemia Panel: No results for input(s): VITAMINB12, FOLATE, FERRITIN, TIBC, IRON, RETICCTPCT in the last 72 hours. Urine analysis:    Component Value Date/Time   COLORURINE AMBER (A) 06/18/2018 1445   APPEARANCEUR CLOUDY (A) 06/18/2018 1445   LABSPEC 1.029 06/18/2018 1445   PHURINE 5.0 06/18/2018 1445   GLUCOSEU >=500 (A) 06/18/2018 1445   HGBUR NEGATIVE 06/18/2018 1445   BILIRUBINUR NEGATIVE 06/18/2018 1445   KETONESUR 20 (A) 06/18/2018 1445   PROTEINUR NEGATIVE 06/18/2018 1445   NITRITE NEGATIVE 06/18/2018 1445   LEUKOCYTESUR NEGATIVE 06/18/2018 1445   Sepsis Labs: @LABRCNTIP (procalcitonin:4,lacticidven:4)  )No results found for this or any previous visit (from the past 240  hours).    Studies: DG Chest 2 View Result Date: 04/17/2024 CLINICAL DATA:  Shortness of breath. EXAM: DG CHEST 2V COMPARISON:  Radiograph 03/12/2024 FINDINGS: Mild cardiomegaly, stable from prior. Improved right lung base aeration with decreased elevation of hemidiaphragm and opacity. Minor subsegmental atelectasis at the left lung base. No pulmonary edema. No pneumothorax. No significant pleural effusion. IMPRESSION: 1. Improved right lung base aeration with decreased elevation of hemidiaphragm and opacity. 2. Minor subsegmental atelectasis at the left lung base. Electronically Signed   By: Andrea Gasman M.D.   On: 04/17/2024 18:04    Scheduled Meds:  sodium chloride    Intravenous Once   buPROPion   300 mg Oral Daily   pantoprazole  (PROTONIX ) IV  40 mg Intravenous Q12H   rosuvastatin   20 mg Oral Daily    Continuous Infusions:   LOS: 1 day     Terry LOISE Hurst, MD [facility] Pager [phone]  If 7PM-7AM, please contact night-coverage [email or web address] Password [email or web address] 04/18/2024, 1:53 PM

## 2024-04-18 NOTE — Consult Note (Addendum)
 Cardiology Consultation:  Patient ID: Keith Leblanc MRN: [id number]; DOB: [birth year]  Admit date: 04/17/2024 Date of Consult: 04/18/2024  Primary Care Provider: [name], NP Primary Cardiologist: None  Primary Electrophysiologist:  None   Patient Profile:  Keith Leblanc is a 58 y.o. male with a hx of CHF, hypertrophic cardiomyopathy, paroxysmal AF, HTN, CKD IV, etoh who is being seen today for the evaluation of preoperative assessment at the request of Keith Hurst, DO.  History of Present Illness:  [name] presents with worsening shortness of breath in the past several days.  He has a known history of systolic heart failure with midrange ejection fraction EF 45-50%.  Concerns for cardiac amyloidosis, but EMB consistent with HCM.  He reports symptoms have been with him since taking his heart failure medications.  On arrival to [facility] BP 90/60.  Hemoglobin found to be 6.8.  Hemoglobin 1 month ago was 12.3.  He is noticed blood in his stool.  Labs are notable for serum creatinine of 3.13.  BNP 1324.  Troponin 59 and 54 on repeat.  Chest x-ray shows no pulmonary edema.  EKG shows atrial fibrillation heart rate 68 with PVCs.  At rest he is not short of breath.  Has noticeable edema on exam.  He is a bit worried about taking Lasix  while in a hallway bed.  He describes no chest pain.  No fevers or chills.  He underwent a cardiopulmonary exercise stress test on 04/09/2024 which showed severe functional impairment.  Past Medical History: Past Medical History:  Diagnosis Date   Blood in stool    bright red blood    Chicken pox    Diabetes mellitus (HCC)    Erectile dysfunction    Gout    Hypertension    Stroke Keith Leblanc)     Past Surgical History: Past Surgical History:  Procedure Laterality Date   APPENDECTOMY  2004   COLONOSCOPY     ENDOMYOCARDIAL BIOPSY N/A 03/16/2024   Procedure: ENDOMYOCARDIAL BIOPSY;  Surgeon: Keith Morene PARAS, MD;  Location: [facility] INVASIVE CV  LAB;  Service: Cardiovascular;  Laterality: N/A;   RIGHT HEART CATH N/A 03/16/2024   Procedure: RIGHT HEART CATH;  Surgeon: Keith Morene PARAS, MD;  Location: [facility] INVASIVE CV LAB;  Service: Cardiovascular;  Laterality: N/A;   TOOTH EXTRACTION  07/06/2020     Allergies:    No Known Allergies  Social History:   Social History   Socioeconomic History   Marital status: Single    Spouse name: Not on file   Number of children: Not on file   Years of education: Not on file   Highest education level: Not on file  Occupational History   Not on file  Tobacco Use   Smoking status: Former    Current packs/day: 0.00    Types: Cigarettes, Cigars    Quit date: 03/10/2024    Years since quitting: 0.1   Smokeless tobacco: Never  Vaping Use   Vaping status: Never Used  Substance and Sexual Activity   Alcohol use: Not Currently   Drug use: Not Currently    Types: Marijuana   Sexual activity: Yes  Other Topics Concern   Not on file  Social History Narrative   Works for [employer]    Not married    One daughter who does not live with him    Likes to gamble, walk in the parks, travel.    Social Drivers of Corporate Investment Banker Strain: Not on file  Food Insecurity: No Food Insecurity (03/12/2024)   Hunger Vital Sign    Worried About Running Out of Food in the Last Year: Never true    Ran Out of Food in the Last Year: Never true  Transportation Needs: No Transportation Needs (03/12/2024)   PRAPARE - Administrator, Civil Service (Medical): No    Lack of Transportation (Non-Medical): No  Physical Activity: Not on file  Stress: Not on file  Social Connections: Not on file  Intimate Partner Violence: Not At Risk (03/12/2024)   Humiliation, Afraid, Rape, and Kick questionnaire    Fear of Current or Ex-Partner: No    Emotionally Abused: No    Physically Abused: No    Sexually Abused: No     Family History:    Family History  Problem Relation Age of Onset   Alcohol abuse  Father    Hypertension Father    Heart disease Father    Gout Father    Breast cancer Mother    Hypertension Mother    Gout Mother    Healthy Sister    Healthy Daughter    Colon cancer Neg Hx    Esophageal cancer Neg Hx    Rectal cancer Neg Hx    Stomach cancer Neg Hx      ROS:  All other ROS reviewed and negative. Pertinent positives noted in the HPI.     Physical Exam/Data:   Vitals:   04/18/24 0800 04/18/24 0856 04/18/24 0900 04/18/24 1003  BP: 96/73  99/70 (!) 91/59  Pulse:   75 (!) 53  Resp: 13  15 16   Temp:  98.5 F (36.9 C)    TempSrc:  Oral  Oral  SpO2:    100%   No intake or output data in the 24 hours ending 04/18/24 1231     03/24/2024    3:50 PM 03/23/2024   11:04 AM 03/18/2024    5:00 AM  Last 3 Weights  Weight (lbs) 239 lb 3.2 oz 236 lb 229 lb 14.4 oz  Weight (kg) 108.5 kg 107.049 kg 104.282 kg    There is no height or weight on file to calculate BMI.  General: Well nourished, well developed, in no acute distress Head: Atraumatic, normal size  Eyes: PEERLA, EOMI  Neck: Supple, no JVD Endocrine: No thryomegaly Cardiac: Normal S1, S2; irregular rhythm, no murmurs Lungs: Clear to auscultation bilaterally, no wheezing, rhonchi or rales  Abd: Soft, nontender, no hepatomegaly  Ext: 2+ pitting edema Musculoskeletal: No deformities, BUE and BLE strength normal and equal Skin: Warm and dry, no rashes   Neuro: Alert and oriented to person, place, time, and situation, CNII-XII grossly intact, no focal deficits  Psych: Normal mood and affect   EKG:  The EKG was personally reviewed and demonstrates: Atrial fibrillation heart rate 68, PVCs, low voltage   Relevant CV Studies: CMR 03/16/2024 IMPRESSION: 1. Mild decrease in left ventricular systolic function (LVEF =43%). Myocardial mass index of 90 g/m2, severely elevated with LGE pattern and parametric mapping consistent with cardiac amyloidosis.   2. Low normal right ventricular systolic function (RVEF =47%).  Mild increase in thickness, consistent with cardiac amyloidosis. Dilation of the pulmonary artery with RV dilation by volumetric assessment. Results discussed with primary team.  TTE 03/16/2024  1. Left ventricular ejection fraction, by estimation, is 55 to 60%. The  left ventricle has normal function.   2. Right ventricular systolic function is low normal.   3. Echo performed as a part  of biopsy. There is no evidence of  significant pericardial effusion after procedural intervention.   EMB 03/16/2024 The myocytes show cellular e hypertrophy with enlarged, hyperchromatic  and irregular nuclei.  These features are most typical of hypertrophic  or dilated cardiomyopathy.  [place] red stain is negative for amyloid.   Assessment and Plan:   # Preoperative Assessment # Blood loss anemia - Presents with worsening shortness of breath.  Found to have new anemia and blood in his stool.  Hemoglobin is considerably lower than it was previously. - Concerns for GI bleed. - Hold Eliquis . - He does have some lower extremity edema but from respiratory standpoint he is stable.  He is not wanting to take any IV Lasix  while he is in a hallway bed in the ER.  I think we can hold this until he is in a room. - From my standpoint, his anemia is quite concerning.  This needs urgent evaluation.  Would recommend to proceed with endoscopy as gastroenterology determines appropriate.  His edema should not preclude him from this.  He has no pulmonary symptoms at rest.  We can optimize his volume status while he is here in the hospital. - No further cardiac testing prior to EGD.  He has moderate risk based on low blood pressure and what appears to be advanced hypertrophic cardiomyopathy.  # Chronic heart failure with midrange ejection fraction, EF 43% # HCM - Concerns for cardiac amyloidosis but endomyocardial biopsy came back with hypertrophic cardiomyopathy. - He does have some evidence of volume overload but it is  mainly in the legs. - Would recommend to hold diuresis for now.  He needs to get through his EGD.  We need improvement in his volume status. - Given marginal blood pressure and acute GI bleed would recommend to hold GDMT.  We need to get him through his procedure.  # Persistent Afib - hold eliquis  -no av nodal agents   # Elevated Troponin - Demand.   # CKD IV - Stable      For questions or updates, please contact [facility] Please consult [email or web address] for contact info under     Signed, Darryle T. Barbaraann, MD, [facility] [place]  [facility]  04/18/2024 12:31 PM

## 2024-04-18 NOTE — Consult Note (Addendum)
 Referring Provider: Dr. Terry Hurst Primary Care Physician:  [name], NP Primary Gastroenterologist: Dr. Victory Brand  Reason for Consultation:  Anemia, black stool  HPI: Keith Leblanc is a 58 y.o. male with a past medical history of hypertension, hyperlipidemia, paroxysmal atrial fibrillation on Eliquis , biventricular CHF, CVA 10/2022, diabetes mellitus type 2, CKD stage IIIa, alcohol use disorder, chronic tobacco use, gout, GERD, diverticulosis and colon polyps.   He was recently admitted to the hospital 9/19 - 02/2024 with acute diastolic CHF, AKI on CKD with concerns for cardiac amyloidosis.  ECHO showed LVEF 45 to 50% with severe LVH.  He was carefully diuresed, weight down 40lbs. Right heart cath/endomyocardial biopsy showed elevated filling pressures, severely reduced cardiac index and endomyocardial biopsies were negative for amyloidosis.  His clinical status stabilized and he was discharged home with plans for cardiology and nephrology follow-up.  He was discharged home on Lasix  40 mg daily as needed and Bisoprolol  10 mg daily.  He presented to the ED 04/17/2024 with fatigue, decreased appetite and SOB which had progressively worsened since he underwent a stress test on 04/09/2024 (results pending). He also noted having dizziness and decreased urine output on Lasix  40mg  every day.  No chest pain.  Labs in the ED showed a WBC count of 8.0. Hg 7.5 ( Hg 11.4 three weeks ago). HCT 24.8. MCV 89.2. PLT 465. Na+ 137. K+ 4.4. Glu 174. BUN 57 up from 31 and Cr 3.13 up from 2.47 three weeks ago. GFR 22. BNP 1,324.0.  Troponin 59 and 54. FOBT positive. INR 1.6.  LFTs not done.  Chest xray showed improved right lung base aeration with decreased elevation of hemidiaphragm and opacity and minor subsegmental atelectasis to the left lung base.   Repeat Hg 6.8. Rectal exam per the ED physician identified black stool on glove without palpable mass. Started on Pantoprazole  IV. Systolic BP 90's - 100's.  Received IV fluid and transfused one unit of PRBCs.   Last dose of Eliquis  was 1pm on 10/25.  No NSAID use.  He endorses having shortness of breath with exertion for many months progressively worsened over the past week following his cardiac stress test as noted above.  No chest pain.  He stated passing black soft to loose stools daily for the past year and also noted seeing bright red blood in the toilet water and on the toilet tissue daily for the past year.  No abdominal or rectal pain.  He has a history of GERD which he attributed to heavy alcohol use for which he took Omeprazole  20 mg daily for many years, quit taking it 1 month ago. No recent heartburn.  No dysphagia.  He underwent an EGD 04/29/2018 which showed a small hiatal hernia, reflux esophagitis, gastritis without evidence of H. pylori and a benign duodenal polyp.  He underwent a colonoscopy on the same date which identified 2 polyps removed from the colon, path report consistent with a tubular adenoma and sessile serrated polyp without dysplasia, diverticulosis to the left colon and nonbleeding internal hemorrhoids.  He was advised to repeat a colonoscopy in 5 years which was not done.  He was seen by Dr. Brand in our outpatient GI clinic 01/01/2023 due to having iron deficiency anemia in setting of bleeding. Rectal exam at that time identified internal hemorrhoids.  He was also noted to have B12 deficiency at that time.  A colonoscopy was recommended, however, the patient did not wish to pursue at that time.  No known family  history of esophageal, gastric or colorectal cancer.  He has a history of alcohol use disorder.  Previously drank 1 pint of tequila daily for many years.  Last alcohol intake was 40 days ago.  History of tobacco use, stopped smoking cigars 40 days ago as well.  Denies drug use.  CARDIAC EVALUATION:  Cardiac stress test 04/09/2024, preliminary results:  Preliminary Conclusion: Exercise testing with gas exchange  demonstrates a sever functional impairment when compared to matched sedentary norms.   Limited ECHO 03/16/2024:  IMPRESSIONS Left ventricular ejection fraction, by estimation, is 55 to 60%. The left ventricle has normal function. 1. 2. Right ventricular systolic function is low normal. Echo performed as a part of biopsy. There is no evidence of significant pericardial effusion after procedural intervention.   Right heart catheterization 03/15/2024:  IMPRESSION:  1. Right heart catheterization and endomyocardial biopsy with 4 specimens obtained  2. Moderately elevated biventricular filling pressures  3. Severely reduced index by thermodilution  4. Moderate group II PH with evidence of noncompliant left atrium  5. Post procedure RA and echo similar to pre  6.   A. ENDOMYOCARDIUM, RIGHT, BIOPSY:  Myocyte hypertrophy with nuclear enlargement, hyperchromasia and  irregularity.  [place]ongo red stain negative for amyloid.  See comment.   COMMENT:  The myocytes show cellular e hypertrophy with enlarged, hyperchromatic  and irregular nuclei.  These features are most typical of hypertrophic  or dilated cardiomyopathy.  Congo red stain is negative for amyloid.   ECHO 01/2024:   EF 45-50%, severe LVH, abnormal RV free wall strain, ~17%, RV mildly reduced, mildly elevated PASP. LA mod dilated, RA mildly dilated, small pericardial effusion present, trivial MR, mild TR, tricuspid valve abnormally thickened, aortic valve regurgitation trivial.   GI PROCEDURES:   EGD 04/29/2018:  - Small hiatal hernia.  - LA Grade A reflux esophagitis. Biopsied.  - Small hiatal hernia.  - Gastritis. Biopsied.   Colonoscopy 04/29/2018:  - Diverticulosis in the sigmoid colon and in the descending colon.  - One 6 mm polyp in the ascending colon, removed with a cold snare. Resected and retrieved.  - One 4 mm polyp in the descending colon, removed with a cold snare. Resected and retrieved.  - Non-bleeding internal  hemorrhoids.  - The examination was otherwise normal on direct and retroflexion views.  - 5 year recall colonoscopy  1. Surgical [P], duodenal, polyp  - BENIGN POLYPOID SMALL BOWEL-TYPE MUCOSA.  - THERE IS NO EVIDENCE OF MALIGNANCY.  2. Surgical [P], gastric antrum and gastric body  - CHRONIC INACTIVE GASTRITIS, MILD.  - THERE IS NO EVIDENCE OF HELICOBACTER PYLORI, DYSPLASIA OR MALIGNANCY.  - SEE COMMENT.  3. Surgical [P], GE junction  - BENIGN SQUAMOUS MUCOSA.  - THERE IS NO EVIDENCE OF SIGNIFICANT INFLAMMATION, GOBLET CELL METAPLASIA, DYSPLASIA OR  MALIGNANCY.  - SEE COMMENT.  4. Surgical [P], ascending, descending, polyp (2)  - TUBULAR ADENOMA(S).  - SESSILE SERRATED POLYP WITHOUT CYTOLOGIC DYSPLASIA.  - HIGH GRADE DYSPLASIA IS NOT IDENTIFIED    Past Medical History:  Diagnosis Date   Blood in stool    bright red blood    Chicken pox    Diabetes mellitus (HCC)    Erectile dysfunction    Gout    Hypertension    Stroke [facility])     Past Surgical History:  Procedure Laterality Date   APPENDECTOMY  2004   COLONOSCOPY     ENDOMYOCARDIAL BIOPSY N/A 03/16/2024   Procedure: ENDOMYOCARDIAL BIOPSY;  Surgeon: Zenaida Morene PARAS,  MD;  Location: [facility] INVASIVE CV LAB;  Service: Cardiovascular;  Laterality: N/A;   RIGHT HEART CATH N/A 03/16/2024   Procedure: RIGHT HEART CATH;  Surgeon: Zenaida Morene PARAS, MD;  Location: [facility] INVASIVE CV LAB;  Service: Cardiovascular;  Laterality: N/A;   TOOTH EXTRACTION  07/06/2020    Prior to Admission medications   Medication Sig Start Date End Date Taking? Authorizing Provider  apixaban  (ELIQUIS ) 5 MG TABS tablet Take 1 tablet (5 mg total) by mouth 2 (two) times daily. 04/08/24  Yes Hayes Beckey CROME, NP  bisoprolol  (ZEBETA ) 10 MG tablet Take 2 tablets (20 mg total) by mouth daily. 04/08/24  Yes Hayes Beckey CROME, NP  buPROPion  (WELLBUTRIN  XL) 300 MG 24 hr tablet TAKE 1 TABLET BY MOUTH ONCE  DAILY 04/13/24  Yes Nafziger, Darleene, NP  colchicine  0.6 MG tablet TAKE 1  TABLET BY MOUTH DAILY  WHEN HAVING FLARES TAKE 1 TABLET BY MOUTH TWICE DAILY Patient taking differently: Take 0.6 mg by mouth as needed. TAKE 1 TABLET BY MOUTH  DAILY WHEN HAVING FLARES  TAKE 1 TABLET BY MOUTH  TWICE DAILY 09/05/23  Yes Nafziger, Darleene, NP  empagliflozin  (JARDIANCE ) 10 MG TABS tablet Take 1 tablet (10 mg total) by mouth daily. 04/08/24  Yes Hayes Beckey CROME, NP  furosemide  (LASIX ) 20 MG tablet Take 2 tablets (40 mg total) by mouth as needed (For increased swelling, weight gain of 3 LB in 1 day or 5 LB in 1 week). 04/08/24  Yes Hayes Beckey CROME, NP  hydrOXYzine (VISTARIL) 50 MG capsule Take 1 capsule (50 mg total) by mouth every 8 (eight) hours as needed. Patient taking differently: Take 50 mg by mouth every 8 (eight) hours as needed for itching. 03/23/24  Yes Nafziger, Darleene, NP  magnesium  oxide (MAG-OX) 400 (240 Mg) MG tablet TAKE 1 TABLET(400 MG) BY MOUTH DAILY 02/12/24  Yes West, Katlyn D, NP  Multiple Vitamins-Minerals (ONE-A-DAY MENS 50+) TABS Take 1 tablet by mouth daily with breakfast.   Yes [provider]  omeprazole  (PRILOSEC) 40 MG capsule TAKE 1 CAPSULE BY MOUTH DAILY Patient taking differently: Take 40 mg by mouth daily before breakfast. 11/12/23  Yes Nafziger, Darleene, NP  potassium chloride  SA (KLOR-CON  M) 20 MEQ tablet Take 2 tablets (40 mEq total) by mouth daily as needed (take with lasix  as needed dose). 04/08/24  Yes Hayes Beckey CROME, NP  rosuvastatin  (CRESTOR ) 20 MG tablet Take 1 tablet (20 mg total) by mouth daily. 04/08/24  Yes Hayes Beckey CROME, NP  thiamine  (VITAMIN B-1) 100 MG tablet Take 1 tablet (100 mg total) by mouth daily. 11/11/22  Yes Toberman, Stevi W, NP  triamcinolone cream (KENALOG) 0.5 % Apply 1 Application topically 2 (two) times daily. 03/23/24  Yes Nafziger, Darleene, NP  blood glucose meter kit and supplies Dispense based on patient and insurance preference. Use up to four times daily as directed. (FOR ICD-9 250.00, 250.01). 07/09/16   Sebastian Toribio GAILS, MD  glucose  blood (ONETOUCH ULTRA TEST) test strip Use to check blood sugar 2-3 times a day. 03/01/24   Trixie File, MD  Insulin  Pen Needle (B-D UF III MINI PEN NEEDLES) 31G X 5 MM MISC USE 4 TIMES DAILY AFTER MEALS AND AT BEDTIME 02/24/20   Trixie File, MD  Lancets [facility] ULTRASOFT) lancets Test blood sugars as directed. Dx E11.9 08/06/16   Nafziger, Cory, NP  nicotine  (NICODERM CQ  - DOSED IN MG/24 HR) 7 mg/24hr patch Place 1 patch (7 mg total) onto the skin daily.  Patient not taking: Reported on 04/17/2024 03/19/24   Fairy Frames, MD  potassium chloride  (KLOR-CON ) 10 MEQ tablet TAKE 1 TABLET(10 MEQ) BY MOUTH DAILY Patient not taking: Reported on 04/17/2024 04/08/24   Hayes Beckey CROME, NP    Current Facility-Administered Medications  Medication Dose Route Frequency Provider Last Rate Last Admin   0.9 %  sodium chloride  infusion (Manually program via Guardrails IV Fluids)   Intravenous Once Dorrell, Robert, MD   Held at 04/17/24 2300   acetaminophen  (TYLENOL ) tablet 650 mg  650 mg Oral Q6H PRN Dena Charleston, MD       Or   acetaminophen  (TYLENOL ) suppository 650 mg  650 mg Rectal Q6H PRN Dena Charleston, MD       buPROPion  (WELLBUTRIN  XL) 24 hr tablet 300 mg  300 mg Oral Daily Dorrell, Robert, MD       ondansetron  (ZOFRAN ) tablet 4 mg  4 mg Oral Q6H PRN Dena Charleston, MD       Or   ondansetron  (ZOFRAN ) injection 4 mg  4 mg Intravenous Q6H PRN Dena Charleston, MD       pantoprazole  (PROTONIX ) injection 40 mg  40 mg Intravenous Q12H Dena Charleston, MD       rosuvastatin  (CRESTOR ) tablet 20 mg  20 mg Oral Daily Dorrell, Robert, MD       Current Outpatient Medications  Medication Sig Dispense Refill   apixaban  (ELIQUIS ) 5 MG TABS tablet Take 1 tablet (5 mg total) by mouth 2 (two) times daily. 180 tablet 1   bisoprolol  (ZEBETA ) 10 MG tablet Take 2 tablets (20 mg total) by mouth daily. 180 tablet 3   buPROPion  (WELLBUTRIN  XL) 300 MG 24 hr tablet TAKE 1 TABLET BY MOUTH ONCE  DAILY 30 tablet 11    colchicine  0.6 MG tablet TAKE 1 TABLET BY MOUTH DAILY  WHEN HAVING FLARES TAKE 1 TABLET BY MOUTH TWICE DAILY (Patient taking differently: Take 0.6 mg by mouth as needed. TAKE 1 TABLET BY MOUTH  DAILY WHEN HAVING FLARES  TAKE 1 TABLET BY MOUTH  TWICE DAILY) 180 tablet 3   empagliflozin  (JARDIANCE ) 10 MG TABS tablet Take 1 tablet (10 mg total) by mouth daily. 30 tablet 11   furosemide  (LASIX ) 20 MG tablet Take 2 tablets (40 mg total) by mouth as needed (For increased swelling, weight gain of 3 LB in 1 day or 5 LB in 1 week). 90 tablet 3   hydrOXYzine (VISTARIL) 50 MG capsule Take 1 capsule (50 mg total) by mouth every 8 (eight) hours as needed. (Patient taking differently: Take 50 mg by mouth every 8 (eight) hours as needed for itching.) 15 capsule 0   magnesium  oxide (MAG-OX) 400 (240 Mg) MG tablet TAKE 1 TABLET(400 MG) BY MOUTH DAILY 30 tablet 6   Multiple Vitamins-Minerals (ONE-A-DAY MENS 50+) TABS Take 1 tablet by mouth daily with breakfast.     omeprazole  (PRILOSEC) 40 MG capsule TAKE 1 CAPSULE BY MOUTH DAILY (Patient taking differently: Take 40 mg by mouth daily before breakfast.) 90 capsule 3   potassium chloride  SA (KLOR-CON  M) 20 MEQ tablet Take 2 tablets (40 mEq total) by mouth daily as needed (take with lasix  as needed dose). 30 tablet 2   rosuvastatin  (CRESTOR ) 20 MG tablet Take 1 tablet (20 mg total) by mouth daily. 90 tablet 3   thiamine  (VITAMIN B-1) 100 MG tablet Take 1 tablet (100 mg total) by mouth daily. 30 tablet 0   triamcinolone cream (KENALOG) 0.5 % Apply 1 Application topically 2 (  two) times daily. 90 g 0   blood glucose meter kit and supplies Dispense based on patient and insurance preference. Use up to four times daily as directed. (FOR ICD-9 250.00, 250.01). 1 each 0   glucose blood (ONETOUCH ULTRA TEST) test strip Use to check blood sugar 2-3 times a day. 300 each 12   Insulin  Pen Needle (B-D UF III MINI PEN NEEDLES) 31G X 5 MM MISC USE 4 TIMES DAILY AFTER MEALS AND AT BEDTIME  100 each 11   Lancets (ONETOUCH ULTRASOFT) lancets Test blood sugars as directed. Dx E11.9 100 each 12   nicotine  (NICODERM CQ  - DOSED IN MG/24 HR) 7 mg/24hr patch Place 1 patch (7 mg total) onto the skin daily. (Patient not taking: Reported on 04/17/2024) 28 patch 0   potassium chloride  (KLOR-CON ) 10 MEQ tablet TAKE 1 TABLET(10 MEQ) BY MOUTH DAILY (Patient not taking: Reported on 04/17/2024) 90 tablet 0    Allergies as of 04/17/2024   (No Known Allergies)    Family History  Problem Relation Age of Onset   Alcohol abuse Father    Hypertension Father    Heart disease Father    Gout Father    Breast cancer Mother    Hypertension Mother    Gout Mother    Healthy Sister    Healthy Daughter    Colon cancer Neg Hx    Esophageal cancer Neg Hx    Rectal cancer Neg Hx    Stomach cancer Neg Hx     Social History   Socioeconomic History   Marital status: Single    Spouse name: Not on file   Number of children: Not on file   Years of education: Not on file   Highest education level: Not on file  Occupational History   Not on file  Tobacco Use   Smoking status: Former    Current packs/day: 0.00    Types: Cigarettes, Cigars    Quit date: 03/10/2024    Years since quitting: 0.1   Smokeless tobacco: Never  Vaping Use   Vaping status: Never Used  Substance and Sexual Activity   Alcohol use: Not Currently   Drug use: Not Currently    Types: Marijuana   Sexual activity: Yes  Other Topics Concern   Not on file  Social History Narrative   Works for [employer]    Not married    One daughter who does not live with him    Likes to gamble, walk in the parks, travel.    Social Drivers of Corporate Investment Banker Strain: Not on file  Food Insecurity: No Food Insecurity (03/12/2024)   Hunger Vital Sign    Worried About Running Out of Food in the Last Year: Never true    Ran Out of Food in the Last Year: Never true  Transportation Needs: No Transportation Needs (03/12/2024)   PRAPARE  - Administrator, Civil Service (Medical): No    Lack of Transportation (Non-Medical): No  Physical Activity: Not on file  Stress: Not on file  Social Connections: Not on file  Intimate Partner Violence: Not At Risk (03/12/2024)   Humiliation, Afraid, Rape, and Kick questionnaire    Fear of Current or Ex-Partner: No    Emotionally Abused: No    Physically Abused: No    Sexually Abused: No    Review of Systems: Gen: Denies fever, sweats or chills. No weight loss.  CV: Denies chest pain, palpitations or edema. Resp:+ DOE.  GI: See  HPI.  GU : Denies urinary burning, blood in urine, increased urinary frequency or incontinence. MS: Denies joint pain, muscles aches or weakness. Derm: Denies rash, itchiness, skin lesions or unhealing ulcers. Psych: Denies depression, anxiety, memory loss or confusion. Heme: Denies easy bruising, bleeding. Neuro: + Dizziness.  Endo:  Denies any problems with DM, thyroid  or adrenal function.  Physical Exam: Vital signs in last 24 hours: Temp:  [97.9 F (36.6 C)-98.5 F (36.9 C)] 98.3 F (36.8 C) (10/26 0425) Pulse Rate:  [28-99] 61 (10/26 0700) Resp:  [10-21] 18 (10/26 0700) BP: (78-108)/(42-93) 86/62 (10/26 0700) SpO2:  [76 %-100 %] 100 % (10/26 0700)   General: Alert and oriented 58 year old male in no acute distress. Head:  Normocephalic and atraumatic. Eyes:  No scleral icterus. Conjunctiva pink. Ears:  Normal auditory acuity. Nose:  No deformity, discharge or lesions. Mouth:  Dentition intact. No ulcers or lesions.  Neck:  Supple. No lymphadenopathy or thyromegaly.  Lungs: Breath sounds clear throughout. No wheezes, rhonchi or crackles.  Heart: Bradycardic, no murmurs. Abdomen: Soft, nondistended.  Nontender.  Positive bowel sounds to all 4 quadrants.  Small subcutaneous nodule, likely lipoma 3 to 4 cm below the xiphoid process.  No hepatosplenomegaly.  No bruit. Rectal: Deferred.  Black stool FOBT positive per ED  provider. Musculoskeletal:  Symmetrical without gross deformities.  Pulses:  Normal pulses noted. Extremities: Small amount of edema to the lower extremities. Neurologic:  Alert and  oriented x 4. No focal deficits.  Skin:  Intact without significant lesions or rashes. Psych:  Alert and cooperative. Normal mood and affect.  Intake/Output from previous day: No intake/output data recorded. Intake/Output this shift: No intake/output data recorded.  Lab Results: Recent Labs    04/17/24 1704 04/18/24 0104  WBC 8.0 8.3  HGB 7.5* 6.8*  HCT 24.8* 22.1*  PLT 465* 432*   BMET Recent Labs    04/17/24 1704  NA 137  K 4.4  CL 105  CO2 21*  GLUCOSE 174*  BUN 57*  CREATININE 3.13*  CALCIUM  9.1   LFT No results for input(s): PROT, ALBUMIN, AST, ALT, ALKPHOS, BILITOT, BILIDIR, IBILI in the last 72 hours. PT/INR Recent Labs    04/17/24 2253  LABPROT 19.5*  INR 1.6*   Hepatitis Panel No results for input(s): HEPBSAG, HCVAB, HEPAIGM, HEPBIGM in the last 72 hours.    Studies/Results: DG Chest 2 View Result Date: 04/17/2024 CLINICAL DATA:  Shortness of breath. EXAM: DG CHEST 2V COMPARISON:  Radiograph 03/12/2024 FINDINGS: Mild cardiomegaly, stable from prior. Improved right lung base aeration with decreased elevation of hemidiaphragm and opacity. Minor subsegmental atelectasis at the left lung base. No pulmonary edema. No pneumothorax. No significant pleural effusion. IMPRESSION: 1. Improved right lung base aeration with decreased elevation of hemidiaphragm and opacity. 2. Minor subsegmental atelectasis at the left lung base. Electronically Signed   By: Andrea Gasman M.D.   On: 04/17/2024 18:04    IMPRESSION/PLAN:  58 year old male with a history of borderline chronic anemia admitted with symptomatic acute anemia with melena and bright red rectal bleeding x 1 year. Admission hemoglobin 7.5 down from 11.4. Transfused one unit of PRBCs -> Hg 7.9. BP 70's  90's/50's - 70's.  - Cardiac clearance required prior to pursuing EGD and colonoscopy - Clear liquid diet - Pantoprazole  40 mg IV twice daily - Recommend transfusing to maintain Hg level > 7.5 - CBC every 8 hours - Await further recommendations per Dr. Legrand  CHF with SOB.  Recent  cardiac evaluation included a stress test 10/17, results pending.  Echo 02/2024 showed LVEF 55 to 60% with low RV function.  Status post right heart catheterization 02/2024 including endomyocardial biopsy showed evidence of hypertrophic/dilated cardiomyopathy without evidence of cardiac amyloidosis. BNP 1,324.  - Cardiac consult including cardiac clearance  Paroxysmal atrial fibrillation, last dose of Eliquis  was in the afternoon on 10/25.  INR 1.6. - Continue to hold Eliquis   AKI on CKD stage IIIa  GERD. On Omeprazole  40 mg daily at home years, stopped taking it 1 month ago.  Alcohol use disorder.  Last alcohol intake was 40 days ago.   - Patient counseled complete alcohol abstinence imperative - Add hepatic panel to a.m. lab draw  History of colon polyps.  Colonoscopy 04/2018 identified 2 tubular adenomatous polyps removed from the colon.  Colon polyp surveillance colonoscopy was recommended 04/2023. - See plan above  DM type II  CVA    Elida CHRISTELLA Shawl  04/18/2024, 8:45 AM  I have taken an interval history, thoroughly reviewed the chart and examined the patient. I agree with the Advanced Practitioner's note, impression and recommendations, and have recorded additional findings, impressions and recommendations below. I performed a substantive portion of this encounter (>50% time spent), including a complete performance of the medical decision making.  My additional thoughts are as follows:  He looks well at the present time, breathing comfortably on room air, good oxygen saturation, lungs clear except for some faint crackles at the right base.  Abdomen soft nontender with no reported  pain.  Blood pressure 105/65, pulse 84  58 year old man with heart failure and recent exacerbation leading to hospitalization with over 40 pound diuresis and improvement in symptoms.  Extensive cardiac testing during that admission, was negative for cardiac amyloidosis.  Cardiology follow-up office notes since then reviewed.  Here now with worsening dyspnea and found to have acute on chronic anemia with significant worsening in just last few weeks.  Perhaps as long as a year of intermittent melena, but more so recently and some found on exam by ED provider.  He also has years of bright red blood per rectum that is most likely hemorrhoidal bleeding (based on previous reports and office evaluations).  The melena is the more immediate issue requiring endoscopic attention. His last Eliquis  dose was less than 24 hours ago and he will have delayed clearance of that due to his renal function (which has modest AKI on CKD this admission).  He was initially hypotensive, and that has stabilized since last evening with PRBC transfusion and other treatments.  Troponin elevated, though not surprising given his overall clinical picture and likely represents troponin leak in the setting of anemia, cardiac disease and CKD.  He needs an upper endoscopy, and we are putting this off until tomorrow given his current hemodynamic stability, need for further Eliquis  washout, and request for cardiology evaluation to help us  understand the recent cardiopulmonary exercise test he had done on 04/09/2024.  That is essential for both the GI and anesthesia teams to thoughtfully plan the optimal timing and execution of an EGD for him.  As it stands now, we will plan for an EGD tomorrow with Dr. Stacia. He will need a colonoscopy afterward due to his history of colon polyps and ongoing rectal bleeding, but timing to be determined.  May be best to do it as inpatient given the complexities of his cardiac condition, the fact that he  will be off OAC, and the likely next availability of  an outpatient hospital-based endoscopic procedure being after the first of next year with our current availability.  Meanwhile, continue twice daily oral pantoprazole , every 8 hour hemoglobin and hematocrit (and transfuse to keep hemoglobin greater than 7.5.,  Or other threshold as determined by primary medical service).  I put him on a soft diet today and will make him n.p.o. after midnight.   _________________  This consultation required a high degree of medical decision making due to the nature and complexity of the acute condition(s) being evaluated as well as the patient's medical comorbidities.  Victory LITTIE Brand III [phone]

## 2024-04-18 NOTE — H&P (View-Only) (Signed)
 Referring Provider: Dr. Terry Hurst Primary Care Physician:  [name], NP Primary Gastroenterologist: Dr. Victory Brand  Reason for Consultation:  Anemia, black stool  HPI: Keith Leblanc is a 58 y.o. male with a past medical history of hypertension, hyperlipidemia, paroxysmal atrial fibrillation on Eliquis , biventricular CHF, CVA 10/2022, diabetes mellitus type 2, CKD stage IIIa, alcohol use disorder, chronic tobacco use, gout, GERD, diverticulosis and colon polyps.   He was recently admitted to the hospital 9/19 - 02/2024 with acute diastolic CHF, AKI on CKD with concerns for cardiac amyloidosis.  ECHO showed LVEF 45 to 50% with severe LVH.  He was carefully diuresed, weight down 40lbs. Right heart cath/endomyocardial biopsy showed elevated filling pressures, severely reduced cardiac index and endomyocardial biopsies were negative for amyloidosis.  His clinical status stabilized and he was discharged home with plans for cardiology and nephrology follow-up.  He was discharged home on Lasix  40 mg daily as needed and Bisoprolol  10 mg daily.  He presented to the ED 04/17/2024 with fatigue, decreased appetite and SOB which had progressively worsened since he underwent a stress test on 04/09/2024 (results pending). He also noted having dizziness and decreased urine output on Lasix  40mg  every day.  No chest pain.  Labs in the ED showed a WBC count of 8.0. Hg 7.5 ( Hg 11.4 three weeks ago). HCT 24.8. MCV 89.2. PLT 465. Na+ 137. K+ 4.4. Glu 174. BUN 57 up from 31 and Cr 3.13 up from 2.47 three weeks ago. GFR 22. BNP 1,324.0.  Troponin 59 and 54. FOBT positive. INR 1.6.  LFTs not done.  Chest xray showed improved right lung base aeration with decreased elevation of hemidiaphragm and opacity and minor subsegmental atelectasis to the left lung base.   Repeat Hg 6.8. Rectal exam per the ED physician identified black stool on glove without palpable mass. Started on Pantoprazole  IV. Systolic BP 90's - 100's.  Received IV fluid and transfused one unit of PRBCs.   Last dose of Eliquis  was 1pm on 10/25.  No NSAID use.  He endorses having shortness of breath with exertion for many months progressively worsened over the past week following his cardiac stress test as noted above.  No chest pain.  He stated passing black soft to loose stools daily for the past year and also noted seeing bright red blood in the toilet water and on the toilet tissue daily for the past year.  No abdominal or rectal pain.  He has a history of GERD which he attributed to heavy alcohol use for which he took Omeprazole  20 mg daily for many years, quit taking it 1 month ago. No recent heartburn.  No dysphagia.  He underwent an EGD 04/29/2018 which showed a small hiatal hernia, reflux esophagitis, gastritis without evidence of H. pylori and a benign duodenal polyp.  He underwent a colonoscopy on the same date which identified 2 polyps removed from the colon, path report consistent with a tubular adenoma and sessile serrated polyp without dysplasia, diverticulosis to the left colon and nonbleeding internal hemorrhoids.  He was advised to repeat a colonoscopy in 5 years which was not done.  He was seen by Dr. Brand in our outpatient GI clinic 01/01/2023 due to having iron deficiency anemia in setting of bleeding. Rectal exam at that time identified internal hemorrhoids.  He was also noted to have B12 deficiency at that time.  A colonoscopy was recommended, however, the patient did not wish to pursue at that time.  No known family  history of esophageal, gastric or colorectal cancer.  He has a history of alcohol use disorder.  Previously drank 1 pint of tequila daily for many years.  Last alcohol intake was 40 days ago.  History of tobacco use, stopped smoking cigars 40 days ago as well.  Denies drug use.  CARDIAC EVALUATION:  Cardiac stress test 04/09/2024, preliminary results:  Preliminary Conclusion: Exercise testing with gas exchange  demonstrates a sever functional impairment when compared to matched sedentary norms.   Limited ECHO 03/16/2024:  IMPRESSIONS Left ventricular ejection fraction, by estimation, is 55 to 60%. The left ventricle has normal function. 1. 2. Right ventricular systolic function is low normal. Echo performed as a part of biopsy. There is no evidence of significant pericardial effusion after procedural intervention.   Right heart catheterization 03/15/2024:  IMPRESSION:  1. Right heart catheterization and endomyocardial biopsy with 4 specimens obtained  2. Moderately elevated biventricular filling pressures  3. Severely reduced index by thermodilution  4. Moderate group II PH with evidence of noncompliant left atrium  5. Post procedure RA and echo similar to pre  6.   A. ENDOMYOCARDIUM, RIGHT, BIOPSY:  Myocyte hypertrophy with nuclear enlargement, hyperchromasia and  irregularity.  [place]ongo red stain negative for amyloid.  See comment.   COMMENT:  The myocytes show cellular e hypertrophy with enlarged, hyperchromatic  and irregular nuclei.  These features are most typical of hypertrophic  or dilated cardiomyopathy.  Congo red stain is negative for amyloid.   ECHO 01/2024:   EF 45-50%, severe LVH, abnormal RV free wall strain, ~17%, RV mildly reduced, mildly elevated PASP. LA mod dilated, RA mildly dilated, small pericardial effusion present, trivial MR, mild TR, tricuspid valve abnormally thickened, aortic valve regurgitation trivial.   GI PROCEDURES:   EGD 04/29/2018:  - Small hiatal hernia.  - LA Grade A reflux esophagitis. Biopsied.  - Small hiatal hernia.  - Gastritis. Biopsied.   Colonoscopy 04/29/2018:  - Diverticulosis in the sigmoid colon and in the descending colon.  - One 6 mm polyp in the ascending colon, removed with a cold snare. Resected and retrieved.  - One 4 mm polyp in the descending colon, removed with a cold snare. Resected and retrieved.  - Non-bleeding internal  hemorrhoids.  - The examination was otherwise normal on direct and retroflexion views.  - 5 year recall colonoscopy  1. Surgical [P], duodenal, polyp  - BENIGN POLYPOID SMALL BOWEL-TYPE MUCOSA.  - THERE IS NO EVIDENCE OF MALIGNANCY.  2. Surgical [P], gastric antrum and gastric body  - CHRONIC INACTIVE GASTRITIS, MILD.  - THERE IS NO EVIDENCE OF HELICOBACTER PYLORI, DYSPLASIA OR MALIGNANCY.  - SEE COMMENT.  3. Surgical [P], GE junction  - BENIGN SQUAMOUS MUCOSA.  - THERE IS NO EVIDENCE OF SIGNIFICANT INFLAMMATION, GOBLET CELL METAPLASIA, DYSPLASIA OR  MALIGNANCY.  - SEE COMMENT.  4. Surgical [P], ascending, descending, polyp (2)  - TUBULAR ADENOMA(S).  - SESSILE SERRATED POLYP WITHOUT CYTOLOGIC DYSPLASIA.  - HIGH GRADE DYSPLASIA IS NOT IDENTIFIED    Past Medical History:  Diagnosis Date   Blood in stool    bright red blood    Chicken pox    Diabetes mellitus (HCC)    Erectile dysfunction    Gout    Hypertension    Stroke [facility])     Past Surgical History:  Procedure Laterality Date   APPENDECTOMY  2004   COLONOSCOPY     ENDOMYOCARDIAL BIOPSY N/A 03/16/2024   Procedure: ENDOMYOCARDIAL BIOPSY;  Surgeon: Zenaida Morene PARAS,  MD;  Location: [facility] INVASIVE CV LAB;  Service: Cardiovascular;  Laterality: N/A;   RIGHT HEART CATH N/A 03/16/2024   Procedure: RIGHT HEART CATH;  Surgeon: Zenaida Morene PARAS, MD;  Location: [facility] INVASIVE CV LAB;  Service: Cardiovascular;  Laterality: N/A;   TOOTH EXTRACTION  07/06/2020    Prior to Admission medications   Medication Sig Start Date End Date Taking? Authorizing Provider  apixaban  (ELIQUIS ) 5 MG TABS tablet Take 1 tablet (5 mg total) by mouth 2 (two) times daily. 04/08/24  Yes Hayes Beckey CROME, NP  bisoprolol  (ZEBETA ) 10 MG tablet Take 2 tablets (20 mg total) by mouth daily. 04/08/24  Yes Hayes Beckey CROME, NP  buPROPion  (WELLBUTRIN  XL) 300 MG 24 hr tablet TAKE 1 TABLET BY MOUTH ONCE  DAILY 04/13/24  Yes Nafziger, Darleene, NP  colchicine  0.6 MG tablet TAKE 1  TABLET BY MOUTH DAILY  WHEN HAVING FLARES TAKE 1 TABLET BY MOUTH TWICE DAILY Patient taking differently: Take 0.6 mg by mouth as needed. TAKE 1 TABLET BY MOUTH  DAILY WHEN HAVING FLARES  TAKE 1 TABLET BY MOUTH  TWICE DAILY 09/05/23  Yes Nafziger, Darleene, NP  empagliflozin  (JARDIANCE ) 10 MG TABS tablet Take 1 tablet (10 mg total) by mouth daily. 04/08/24  Yes Hayes Beckey CROME, NP  furosemide  (LASIX ) 20 MG tablet Take 2 tablets (40 mg total) by mouth as needed (For increased swelling, weight gain of 3 LB in 1 day or 5 LB in 1 week). 04/08/24  Yes Hayes Beckey CROME, NP  hydrOXYzine (VISTARIL) 50 MG capsule Take 1 capsule (50 mg total) by mouth every 8 (eight) hours as needed. Patient taking differently: Take 50 mg by mouth every 8 (eight) hours as needed for itching. 03/23/24  Yes Nafziger, Darleene, NP  magnesium  oxide (MAG-OX) 400 (240 Mg) MG tablet TAKE 1 TABLET(400 MG) BY MOUTH DAILY 02/12/24  Yes West, Katlyn D, NP  Multiple Vitamins-Minerals (ONE-A-DAY MENS 50+) TABS Take 1 tablet by mouth daily with breakfast.   Yes [provider]  omeprazole  (PRILOSEC) 40 MG capsule TAKE 1 CAPSULE BY MOUTH DAILY Patient taking differently: Take 40 mg by mouth daily before breakfast. 11/12/23  Yes Nafziger, Darleene, NP  potassium chloride  SA (KLOR-CON  M) 20 MEQ tablet Take 2 tablets (40 mEq total) by mouth daily as needed (take with lasix  as needed dose). 04/08/24  Yes Hayes Beckey CROME, NP  rosuvastatin  (CRESTOR ) 20 MG tablet Take 1 tablet (20 mg total) by mouth daily. 04/08/24  Yes Hayes Beckey CROME, NP  thiamine  (VITAMIN B-1) 100 MG tablet Take 1 tablet (100 mg total) by mouth daily. 11/11/22  Yes Toberman, Stevi W, NP  triamcinolone cream (KENALOG) 0.5 % Apply 1 Application topically 2 (two) times daily. 03/23/24  Yes Nafziger, Darleene, NP  blood glucose meter kit and supplies Dispense based on patient and insurance preference. Use up to four times daily as directed. (FOR ICD-9 250.00, 250.01). 07/09/16   Sebastian Toribio GAILS, MD  glucose  blood (ONETOUCH ULTRA TEST) test strip Use to check blood sugar 2-3 times a day. 03/01/24   Trixie File, MD  Insulin  Pen Needle (B-D UF III MINI PEN NEEDLES) 31G X 5 MM MISC USE 4 TIMES DAILY AFTER MEALS AND AT BEDTIME 02/24/20   Trixie File, MD  Lancets [facility] ULTRASOFT) lancets Test blood sugars as directed. Dx E11.9 08/06/16   Nafziger, Cory, NP  nicotine  (NICODERM CQ  - DOSED IN MG/24 HR) 7 mg/24hr patch Place 1 patch (7 mg total) onto the skin daily.  Patient not taking: Reported on 04/17/2024 03/19/24   Fairy Frames, MD  potassium chloride  (KLOR-CON ) 10 MEQ tablet TAKE 1 TABLET(10 MEQ) BY MOUTH DAILY Patient not taking: Reported on 04/17/2024 04/08/24   Hayes Beckey CROME, NP    Current Facility-Administered Medications  Medication Dose Route Frequency Provider Last Rate Last Admin   0.9 %  sodium chloride  infusion (Manually program via Guardrails IV Fluids)   Intravenous Once Dorrell, Robert, MD   Held at 04/17/24 2300   acetaminophen  (TYLENOL ) tablet 650 mg  650 mg Oral Q6H PRN Dena Charleston, MD       Or   acetaminophen  (TYLENOL ) suppository 650 mg  650 mg Rectal Q6H PRN Dena Charleston, MD       buPROPion  (WELLBUTRIN  XL) 24 hr tablet 300 mg  300 mg Oral Daily Dorrell, Robert, MD       ondansetron  (ZOFRAN ) tablet 4 mg  4 mg Oral Q6H PRN Dena Charleston, MD       Or   ondansetron  (ZOFRAN ) injection 4 mg  4 mg Intravenous Q6H PRN Dena Charleston, MD       pantoprazole  (PROTONIX ) injection 40 mg  40 mg Intravenous Q12H Dena Charleston, MD       rosuvastatin  (CRESTOR ) tablet 20 mg  20 mg Oral Daily Dorrell, Robert, MD       Current Outpatient Medications  Medication Sig Dispense Refill   apixaban  (ELIQUIS ) 5 MG TABS tablet Take 1 tablet (5 mg total) by mouth 2 (two) times daily. 180 tablet 1   bisoprolol  (ZEBETA ) 10 MG tablet Take 2 tablets (20 mg total) by mouth daily. 180 tablet 3   buPROPion  (WELLBUTRIN  XL) 300 MG 24 hr tablet TAKE 1 TABLET BY MOUTH ONCE  DAILY 30 tablet 11    colchicine  0.6 MG tablet TAKE 1 TABLET BY MOUTH DAILY  WHEN HAVING FLARES TAKE 1 TABLET BY MOUTH TWICE DAILY (Patient taking differently: Take 0.6 mg by mouth as needed. TAKE 1 TABLET BY MOUTH  DAILY WHEN HAVING FLARES  TAKE 1 TABLET BY MOUTH  TWICE DAILY) 180 tablet 3   empagliflozin  (JARDIANCE ) 10 MG TABS tablet Take 1 tablet (10 mg total) by mouth daily. 30 tablet 11   furosemide  (LASIX ) 20 MG tablet Take 2 tablets (40 mg total) by mouth as needed (For increased swelling, weight gain of 3 LB in 1 day or 5 LB in 1 week). 90 tablet 3   hydrOXYzine (VISTARIL) 50 MG capsule Take 1 capsule (50 mg total) by mouth every 8 (eight) hours as needed. (Patient taking differently: Take 50 mg by mouth every 8 (eight) hours as needed for itching.) 15 capsule 0   magnesium  oxide (MAG-OX) 400 (240 Mg) MG tablet TAKE 1 TABLET(400 MG) BY MOUTH DAILY 30 tablet 6   Multiple Vitamins-Minerals (ONE-A-DAY MENS 50+) TABS Take 1 tablet by mouth daily with breakfast.     omeprazole  (PRILOSEC) 40 MG capsule TAKE 1 CAPSULE BY MOUTH DAILY (Patient taking differently: Take 40 mg by mouth daily before breakfast.) 90 capsule 3   potassium chloride  SA (KLOR-CON  M) 20 MEQ tablet Take 2 tablets (40 mEq total) by mouth daily as needed (take with lasix  as needed dose). 30 tablet 2   rosuvastatin  (CRESTOR ) 20 MG tablet Take 1 tablet (20 mg total) by mouth daily. 90 tablet 3   thiamine  (VITAMIN B-1) 100 MG tablet Take 1 tablet (100 mg total) by mouth daily. 30 tablet 0   triamcinolone cream (KENALOG) 0.5 % Apply 1 Application topically 2 (  two) times daily. 90 g 0   blood glucose meter kit and supplies Dispense based on patient and insurance preference. Use up to four times daily as directed. (FOR ICD-9 250.00, 250.01). 1 each 0   glucose blood (ONETOUCH ULTRA TEST) test strip Use to check blood sugar 2-3 times a day. 300 each 12   Insulin  Pen Needle (B-D UF III MINI PEN NEEDLES) 31G X 5 MM MISC USE 4 TIMES DAILY AFTER MEALS AND AT BEDTIME  100 each 11   Lancets (ONETOUCH ULTRASOFT) lancets Test blood sugars as directed. Dx E11.9 100 each 12   nicotine  (NICODERM CQ  - DOSED IN MG/24 HR) 7 mg/24hr patch Place 1 patch (7 mg total) onto the skin daily. (Patient not taking: Reported on 04/17/2024) 28 patch 0   potassium chloride  (KLOR-CON ) 10 MEQ tablet TAKE 1 TABLET(10 MEQ) BY MOUTH DAILY (Patient not taking: Reported on 04/17/2024) 90 tablet 0    Allergies as of 04/17/2024   (No Known Allergies)    Family History  Problem Relation Age of Onset   Alcohol abuse Father    Hypertension Father    Heart disease Father    Gout Father    Breast cancer Mother    Hypertension Mother    Gout Mother    Healthy Sister    Healthy Daughter    Colon cancer Neg Hx    Esophageal cancer Neg Hx    Rectal cancer Neg Hx    Stomach cancer Neg Hx     Social History   Socioeconomic History   Marital status: Single    Spouse name: Not on file   Number of children: Not on file   Years of education: Not on file   Highest education level: Not on file  Occupational History   Not on file  Tobacco Use   Smoking status: Former    Current packs/day: 0.00    Types: Cigarettes, Cigars    Quit date: 03/10/2024    Years since quitting: 0.1   Smokeless tobacco: Never  Vaping Use   Vaping status: Never Used  Substance and Sexual Activity   Alcohol use: Not Currently   Drug use: Not Currently    Types: Marijuana   Sexual activity: Yes  Other Topics Concern   Not on file  Social History Narrative   Works for [employer]    Not married    One daughter who does not live with him    Likes to gamble, walk in the parks, travel.    Social Drivers of Corporate Investment Banker Strain: Not on file  Food Insecurity: No Food Insecurity (03/12/2024)   Hunger Vital Sign    Worried About Running Out of Food in the Last Year: Never true    Ran Out of Food in the Last Year: Never true  Transportation Needs: No Transportation Needs (03/12/2024)   PRAPARE  - Administrator, Civil Service (Medical): No    Lack of Transportation (Non-Medical): No  Physical Activity: Not on file  Stress: Not on file  Social Connections: Not on file  Intimate Partner Violence: Not At Risk (03/12/2024)   Humiliation, Afraid, Rape, and Kick questionnaire    Fear of Current or Ex-Partner: No    Emotionally Abused: No    Physically Abused: No    Sexually Abused: No    Review of Systems: Gen: Denies fever, sweats or chills. No weight loss.  CV: Denies chest pain, palpitations or edema. Resp:+ DOE.  GI: See  HPI.  GU : Denies urinary burning, blood in urine, increased urinary frequency or incontinence. MS: Denies joint pain, muscles aches or weakness. Derm: Denies rash, itchiness, skin lesions or unhealing ulcers. Psych: Denies depression, anxiety, memory loss or confusion. Heme: Denies easy bruising, bleeding. Neuro: + Dizziness.  Endo:  Denies any problems with DM, thyroid  or adrenal function.  Physical Exam: Vital signs in last 24 hours: Temp:  [97.9 F (36.6 C)-98.5 F (36.9 C)] 98.3 F (36.8 C) (10/26 0425) Pulse Rate:  [28-99] 61 (10/26 0700) Resp:  [10-21] 18 (10/26 0700) BP: (78-108)/(42-93) 86/62 (10/26 0700) SpO2:  [76 %-100 %] 100 % (10/26 0700)   General: Alert and oriented 58 year old male in no acute distress. Head:  Normocephalic and atraumatic. Eyes:  No scleral icterus. Conjunctiva pink. Ears:  Normal auditory acuity. Nose:  No deformity, discharge or lesions. Mouth:  Dentition intact. No ulcers or lesions.  Neck:  Supple. No lymphadenopathy or thyromegaly.  Lungs: Breath sounds clear throughout. No wheezes, rhonchi or crackles.  Heart: Bradycardic, no murmurs. Abdomen: Soft, nondistended.  Nontender.  Positive bowel sounds to all 4 quadrants.  Small subcutaneous nodule, likely lipoma 3 to 4 cm below the xiphoid process.  No hepatosplenomegaly.  No bruit. Rectal: Deferred.  Black stool FOBT positive per ED  provider. Musculoskeletal:  Symmetrical without gross deformities.  Pulses:  Normal pulses noted. Extremities: Small amount of edema to the lower extremities. Neurologic:  Alert and  oriented x 4. No focal deficits.  Skin:  Intact without significant lesions or rashes. Psych:  Alert and cooperative. Normal mood and affect.  Intake/Output from previous day: No intake/output data recorded. Intake/Output this shift: No intake/output data recorded.  Lab Results: Recent Labs    04/17/24 1704 04/18/24 0104  WBC 8.0 8.3  HGB 7.5* 6.8*  HCT 24.8* 22.1*  PLT 465* 432*   BMET Recent Labs    04/17/24 1704  NA 137  K 4.4  CL 105  CO2 21*  GLUCOSE 174*  BUN 57*  CREATININE 3.13*  CALCIUM  9.1   LFT No results for input(s): PROT, ALBUMIN, AST, ALT, ALKPHOS, BILITOT, BILIDIR, IBILI in the last 72 hours. PT/INR Recent Labs    04/17/24 2253  LABPROT 19.5*  INR 1.6*   Hepatitis Panel No results for input(s): HEPBSAG, HCVAB, HEPAIGM, HEPBIGM in the last 72 hours.    Studies/Results: DG Chest 2 View Result Date: 04/17/2024 CLINICAL DATA:  Shortness of breath. EXAM: DG CHEST 2V COMPARISON:  Radiograph 03/12/2024 FINDINGS: Mild cardiomegaly, stable from prior. Improved right lung base aeration with decreased elevation of hemidiaphragm and opacity. Minor subsegmental atelectasis at the left lung base. No pulmonary edema. No pneumothorax. No significant pleural effusion. IMPRESSION: 1. Improved right lung base aeration with decreased elevation of hemidiaphragm and opacity. 2. Minor subsegmental atelectasis at the left lung base. Electronically Signed   By: Andrea Gasman M.D.   On: 04/17/2024 18:04    IMPRESSION/PLAN:  58 year old male with a history of borderline chronic anemia admitted with symptomatic acute anemia with melena and bright red rectal bleeding x 1 year. Admission hemoglobin 7.5 down from 11.4. Transfused one unit of PRBCs -> Hg 7.9. BP 70's  90's/50's - 70's.  - Cardiac clearance required prior to pursuing EGD and colonoscopy - Clear liquid diet - Pantoprazole  40 mg IV twice daily - Recommend transfusing to maintain Hg level > 7.5 - CBC every 8 hours - Await further recommendations per Dr. Legrand  CHF with SOB.  Recent  cardiac evaluation included a stress test 10/17, results pending.  Echo 02/2024 showed LVEF 55 to 60% with low RV function.  Status post right heart catheterization 02/2024 including endomyocardial biopsy showed evidence of hypertrophic/dilated cardiomyopathy without evidence of cardiac amyloidosis. BNP 1,324.  - Cardiac consult including cardiac clearance  Paroxysmal atrial fibrillation, last dose of Eliquis  was in the afternoon on 10/25.  INR 1.6. - Continue to hold Eliquis   AKI on CKD stage IIIa  GERD. On Omeprazole  40 mg daily at home years, stopped taking it 1 month ago.  Alcohol use disorder.  Last alcohol intake was 40 days ago.   - Patient counseled complete alcohol abstinence imperative - Add hepatic panel to a.m. lab draw  History of colon polyps.  Colonoscopy 04/2018 identified 2 tubular adenomatous polyps removed from the colon.  Colon polyp surveillance colonoscopy was recommended 04/2023. - See plan above  DM type II  CVA    Elida CHRISTELLA Shawl  04/18/2024, 8:45 AM  I have taken an interval history, thoroughly reviewed the chart and examined the patient. I agree with the Advanced Practitioner's note, impression and recommendations, and have recorded additional findings, impressions and recommendations below. I performed a substantive portion of this encounter (>50% time spent), including a complete performance of the medical decision making.  My additional thoughts are as follows:  He looks well at the present time, breathing comfortably on room air, good oxygen saturation, lungs clear except for some faint crackles at the right base.  Abdomen soft nontender with no reported  pain.  Blood pressure 105/65, pulse 84  58 year old man with heart failure and recent exacerbation leading to hospitalization with over 40 pound diuresis and improvement in symptoms.  Extensive cardiac testing during that admission, was negative for cardiac amyloidosis.  Cardiology follow-up office notes since then reviewed.  Here now with worsening dyspnea and found to have acute on chronic anemia with significant worsening in just last few weeks.  Perhaps as long as a year of intermittent melena, but more so recently and some found on exam by ED provider.  He also has years of bright red blood per rectum that is most likely hemorrhoidal bleeding (based on previous reports and office evaluations).  The melena is the more immediate issue requiring endoscopic attention. His last Eliquis  dose was less than 24 hours ago and he will have delayed clearance of that due to his renal function (which has modest AKI on CKD this admission).  He was initially hypotensive, and that has stabilized since last evening with PRBC transfusion and other treatments.  Troponin elevated, though not surprising given his overall clinical picture and likely represents troponin leak in the setting of anemia, cardiac disease and CKD.  He needs an upper endoscopy, and we are putting this off until tomorrow given his current hemodynamic stability, need for further Eliquis  washout, and request for cardiology evaluation to help us  understand the recent cardiopulmonary exercise test he had done on 04/09/2024.  That is essential for both the GI and anesthesia teams to thoughtfully plan the optimal timing and execution of an EGD for him.  As it stands now, we will plan for an EGD tomorrow with Dr. Stacia. He will need a colonoscopy afterward due to his history of colon polyps and ongoing rectal bleeding, but timing to be determined.  May be best to do it as inpatient given the complexities of his cardiac condition, the fact that he  will be off OAC, and the likely next availability of  an outpatient hospital-based endoscopic procedure being after the first of next year with our current availability.  Meanwhile, continue twice daily oral pantoprazole , every 8 hour hemoglobin and hematocrit (and transfuse to keep hemoglobin greater than 7.5.,  Or other threshold as determined by primary medical service).  I put him on a soft diet today and will make him n.p.o. after midnight.   _________________  This consultation required a high degree of medical decision making due to the nature and complexity of the acute condition(s) being evaluated as well as the patient's medical comorbidities.  Victory LITTIE Brand III [phone]

## 2024-04-18 NOTE — Plan of Care (Signed)
  Problem: Education: Goal: Knowledge of General Education information will improve Description: Including pain rating scale, medication(s)/side effects and non-pharmacologic comfort measures 04/18/2024 2307 by Evern Monica HERO, RN Outcome: Progressing 04/18/2024 2053 by Evern Monica HERO, RN Outcome: Progressing   Problem: Health Behavior/Discharge Planning: Goal: Ability to manage health-related needs will improve 04/18/2024 2307 by Evern Monica HERO, RN Outcome: Progressing 04/18/2024 2053 by Evern Monica HERO, RN Outcome: Progressing   Problem: Clinical Measurements: Goal: Ability to maintain clinical measurements within normal limits will improve 04/18/2024 2307 by Evern Monica HERO, RN Outcome: Progressing 04/18/2024 2053 by Evern Monica HERO, RN Outcome: Progressing Goal: Will remain free from infection 04/18/2024 2307 by Evern Monica HERO, RN Outcome: Progressing 04/18/2024 2053 by Evern Monica HERO, RN Outcome: Progressing Goal: Diagnostic test results will improve 04/18/2024 2307 by Evern Monica HERO, RN Outcome: Progressing 04/18/2024 2053 by Evern Monica HERO, RN Outcome: Progressing Goal: Respiratory complications will improve 04/18/2024 2307 by Evern Monica HERO, RN Outcome: Progressing 04/18/2024 2053 by Evern Monica HERO, RN Outcome: Progressing Goal: Cardiovascular complication will be avoided 04/18/2024 2307 by Evern Monica HERO, RN Outcome: Progressing 04/18/2024 2053 by Evern Monica HERO, RN Outcome: Progressing   Problem: Activity: Goal: Risk for activity intolerance will decrease 04/18/2024 2307 by Evern Monica HERO, RN Outcome: Progressing 04/18/2024 2053 by Evern Monica HERO, RN Outcome: Progressing   Problem: Nutrition: Goal: Adequate nutrition will be maintained 04/18/2024 2307 by Evern Monica HERO, RN Outcome: Progressing 04/18/2024 2053 by Evern Monica HERO, RN Outcome: Progressing   Problem: Coping: Goal: Level of anxiety will  decrease 04/18/2024 2307 by Evern Monica HERO, RN Outcome: Progressing 04/18/2024 2053 by Evern Monica HERO, RN Outcome: Progressing   Problem: Coping: Goal: Level of anxiety will decrease 04/18/2024 2307 by Evern Monica HERO, RN Outcome: Progressing 04/18/2024 2053 by Evern Monica HERO, RN Outcome: Progressing   Problem: Elimination: Goal: Will not experience complications related to bowel motility 04/18/2024 2307 by Evern Monica HERO, RN Outcome: Progressing 04/18/2024 2053 by Evern Monica HERO, RN Outcome: Progressing Goal: Will not experience complications related to urinary retention 04/18/2024 2307 by Evern Monica HERO, RN Outcome: Progressing 04/18/2024 2053 by Evern Monica HERO, RN Outcome: Progressing   Problem: Pain Managment: Goal: General experience of comfort will improve and/or be controlled 04/18/2024 2307 by Evern Monica HERO, RN Outcome: Progressing 04/18/2024 2053 by Evern Monica HERO, RN Outcome: Progressing   Problem: Safety: Goal: Ability to remain free from injury will improve 04/18/2024 2307 by Evern Monica HERO, RN Outcome: Progressing 04/18/2024 2053 by Evern Monica HERO, RN Outcome: Progressing   Problem: Skin Integrity: Goal: Risk for impaired skin integrity will decrease 04/18/2024 2307 by Evern Monica HERO, RN Outcome: Progressing 04/18/2024 2053 by Evern Monica HERO, RN Outcome: Progressing

## 2024-04-19 ENCOUNTER — Encounter (HOSPITAL_COMMUNITY)
Admission: EM | Disposition: A | Discharge status: Home / Self Care | Payer: Self-pay | Source: Home / Self Care | Attending: Internal Medicine

## 2024-04-19 ENCOUNTER — Inpatient Hospital Stay (HOSPITAL_COMMUNITY): Admitting: Anesthesiology

## 2024-04-19 ENCOUNTER — Encounter (HOSPITAL_COMMUNITY): Payer: Self-pay | Admitting: Internal Medicine

## 2024-04-19 DIAGNOSIS — K921 Melena: Secondary | ICD-10-CM | POA: Diagnosis not present

## 2024-04-19 DIAGNOSIS — K2289 Other specified disease of esophagus: Secondary | ICD-10-CM | POA: Diagnosis not present

## 2024-04-19 DIAGNOSIS — K3189 Other diseases of stomach and duodenum: Secondary | ICD-10-CM

## 2024-04-19 DIAGNOSIS — D62 Acute posthemorrhagic anemia: Secondary | ICD-10-CM | POA: Diagnosis not present

## 2024-04-19 DIAGNOSIS — N184 Chronic kidney disease, stage 4 (severe): Secondary | ICD-10-CM | POA: Diagnosis not present

## 2024-04-19 DIAGNOSIS — I4819 Other persistent atrial fibrillation: Secondary | ICD-10-CM | POA: Diagnosis not present

## 2024-04-19 DIAGNOSIS — K922 Gastrointestinal hemorrhage, unspecified: Secondary | ICD-10-CM | POA: Diagnosis not present

## 2024-04-19 DIAGNOSIS — I5022 Chronic systolic (congestive) heart failure: Secondary | ICD-10-CM | POA: Diagnosis not present

## 2024-04-19 DIAGNOSIS — K2970 Gastritis, unspecified, without bleeding: Secondary | ICD-10-CM

## 2024-04-19 HISTORY — PX: ESOPHAGOGASTRODUODENOSCOPY: SHX5428

## 2024-04-19 LAB — HEPATIC FUNCTION PANEL
ALT: 91 U/L — ABNORMAL HIGH (ref 0–44)
AST: 71 U/L — ABNORMAL HIGH (ref 15–41)
Albumin: 2.7 g/dL — ABNORMAL LOW (ref 3.5–5.0)
Alkaline Phosphatase: 104 U/L (ref 38–126)
Bilirubin, Direct: 0.3 mg/dL — ABNORMAL HIGH (ref 0.0–0.2)
Indirect Bilirubin: 0.9 mg/dL (ref 0.3–0.9)
Total Bilirubin: 1.2 mg/dL (ref 0.0–1.2)
Total Protein: 5.6 g/dL — ABNORMAL LOW (ref 6.5–8.1)

## 2024-04-19 LAB — COMPREHENSIVE METABOLIC PANEL WITH GFR
ALT: 102 U/L — ABNORMAL HIGH (ref 0–44)
AST: 73 U/L — ABNORMAL HIGH (ref 15–41)
Albumin: 2.4 g/dL — ABNORMAL LOW (ref 3.5–5.0)
Alkaline Phosphatase: 92 U/L (ref 38–126)
Anion gap: 12 (ref 5–15)
BUN: 73 mg/dL — ABNORMAL HIGH (ref 6–20)
CO2: 21 mmol/L — ABNORMAL LOW (ref 22–32)
Calcium: 8.8 mg/dL — ABNORMAL LOW (ref 8.9–10.3)
Chloride: 105 mmol/L (ref 98–111)
Creatinine, Ser: 3.97 mg/dL — ABNORMAL HIGH (ref 0.61–1.24)
GFR, Estimated: 17 mL/min — ABNORMAL LOW (ref >60)
Glucose, Bld: 150 mg/dL — ABNORMAL HIGH (ref 70–99)
Potassium: 4.3 mmol/L (ref 3.5–5.1)
Sodium: 138 mmol/L (ref 135–145)
Total Bilirubin: 1 mg/dL (ref 0.0–1.2)
Total Protein: 5.3 g/dL — ABNORMAL LOW (ref 6.5–8.1)

## 2024-04-19 LAB — CBC
HCT: 23.8 % — ABNORMAL LOW (ref 39.0–52.0)
HCT: 26.1 % — ABNORMAL LOW (ref 39.0–52.0)
Hemoglobin: 7.4 g/dL — ABNORMAL LOW (ref 13.0–17.0)
Hemoglobin: 8.1 g/dL — ABNORMAL LOW (ref 13.0–17.0)
MCH: 27 pg (ref 26.0–34.0)
MCH: 27.6 pg (ref 26.0–34.0)
MCHC: 31 g/dL (ref 30.0–36.0)
MCHC: 31.1 g/dL (ref 30.0–36.0)
MCV: 86.9 fL (ref 80.0–100.0)
MCV: 89.1 fL (ref 80.0–100.0)
Platelets: 404 K/uL — ABNORMAL HIGH (ref 150–400)
Platelets: 471 K/uL — ABNORMAL HIGH (ref 150–400)
RBC: 2.74 MIL/uL — ABNORMAL LOW (ref 4.22–5.81)
RBC: 2.93 MIL/uL — ABNORMAL LOW (ref 4.22–5.81)
RDW: 15.2 % (ref 11.5–15.5)
RDW: 15.3 % (ref 11.5–15.5)
WBC: 10.6 K/uL — ABNORMAL HIGH (ref 4.0–10.5)
WBC: 7.3 K/uL (ref 4.0–10.5)
nRBC: 2.2 % — ABNORMAL HIGH (ref 0.0–0.2)
nRBC: 2.2 % — ABNORMAL HIGH (ref 0.0–0.2)

## 2024-04-19 LAB — HEMOGLOBIN AND HEMATOCRIT, BLOOD
HCT: 22.5 % — ABNORMAL LOW (ref 39.0–52.0)
Hemoglobin: 7.1 g/dL — ABNORMAL LOW (ref 13.0–17.0)

## 2024-04-19 LAB — MAGNESIUM: Magnesium: 2.3 mg/dL (ref 1.7–2.4)

## 2024-04-19 LAB — PHOSPHORUS: Phosphorus: 6.2 mg/dL — ABNORMAL HIGH (ref 2.5–4.6)

## 2024-04-19 SURGERY — EGD (ESOPHAGOGASTRODUODENOSCOPY)
Anesthesia: Monitor Anesthesia Care

## 2024-04-19 MED ORDER — MIDODRINE HCL 5 MG PO TABS
10.0000 mg | ORAL_TABLET | Freq: Three times a day (TID) | ORAL | Status: DC | PRN
  Administered 2024-04-19 – 2024-04-20 (×3): 10 mg via ORAL
  Filled 2024-04-19 (×3): qty 2

## 2024-04-19 MED ORDER — LIDOCAINE 2% (20 MG/ML) 5 ML SYRINGE
INTRAMUSCULAR | Status: DC | PRN
  Administered 2024-04-19: 50 mg via INTRAVENOUS

## 2024-04-19 MED ORDER — SODIUM CHLORIDE 0.9 % IV BOLUS: 500.0000 mL | Freq: Once | INTRAVENOUS | Status: DC

## 2024-04-19 MED ORDER — PROPOFOL 500 MG/50ML IV EMUL
INTRAVENOUS | Status: DC | PRN
Start: 2024-04-19 — End: 2024-04-19
  Administered 2024-04-19: 150 ug/kg/min via INTRAVENOUS

## 2024-04-19 MED ORDER — PHENYLEPHRINE HCL-NACL 20-0.9 MG/250ML-% IV SOLN
INTRAVENOUS | Status: DC | PRN
  Administered 2024-04-19: 30 ug/min via INTRAVENOUS

## 2024-04-19 MED ORDER — SODIUM CHLORIDE 0.9 % IV SOLN
INTRAVENOUS | Status: AC | PRN
  Administered 2024-04-19: 500 mL via INTRAVENOUS

## 2024-04-19 MED ORDER — VASOPRESSIN 20 UNIT/ML IV SOLN
INTRAVENOUS | Status: DC | PRN
  Administered 2024-04-19: 1 [IU] via INTRAVENOUS

## 2024-04-19 MED ORDER — PHENYLEPHRINE 80 MCG/ML (10ML) SYRINGE FOR IV PUSH (FOR BLOOD PRESSURE SUPPORT)
PREFILLED_SYRINGE | INTRAVENOUS | Status: DC | PRN
  Administered 2024-04-19: 160 ug via INTRAVENOUS

## 2024-04-19 NOTE — Progress Notes (Signed)
 Consent for EGD signed by the patient and placed inside patient's chart.

## 2024-04-19 NOTE — Progress Notes (Signed)
 At 1300 this patient was noted to have received 250 ml NS bolus. VS rechecked as below. Contacted provider to update. No new orders at this time.  04/19/24 1304  Vitals  BP (!) 96/42  MAP (mmHg) (!) 57  MEWS COLOR  MEWS Score Color Yellow  MEWS Score  MEWS Temp 0  MEWS Systolic 1  MEWS Pulse 1  MEWS RR 0  MEWS LOC 0  MEWS Score 2   Will continue to monitor.

## 2024-04-19 NOTE — Hospital Course (Addendum)
 Keith Leblanc is a 58 y.o. male with PMH of of prior stroke, hypertension, diabetes who presented to the ED due to shortness of breath w/ progressive worsening over the last week and noticed bright red blood in stool , he in ED BP soft, labs showed bicarb 21, creatinine 3.1 baseline around 3, WBC 8.0, hemoglobin 7.5 baseline around 12, troponin 59, 54, INR 1.6.   CXR>>no acute changes.GI was consulted and started on IV PPI BID.  S/P 1 unit of packed RBCs for Hg 6.8. His Eliquis  was held. Last colonoscopy in 2019 which showed diverticulosis and multiple polyps.  Patient endorsed improving shortness of breath following transfusion no chest pain. Underwent EGD 10/27 has congested friable gastric mucosa likely the source of bleeding  10/30 colonoscopy showed hemorrhoids likely cause of patient's blood in the stool Patient having ongoing AKI nephro cardio following 10/31 underwent RHC, and had central line placement-patient was started on dobutamine drip 1. Elevated right-sided filling pressures out of proportion to mildly elevated PCWP.  Mild pulmonary venous hypertension Low cardiac output by thermo though preserved by Fick. Low PAPi>S uspect primarily RV dysfunction> started dobutamine 2.5 for RV support.  Subjective: Seen and examined Reports feels better today.  No complaints Overnight afebrile BP softer side 88/52 Labs with hyperkalemia creatinine about the same 4.7 hemoglobin 8.7 This am Co-ox 67% Weight on admission 232 > 237  234>228>224>221> 220 today Sleeping poorly overnight and having delirium in night- aaox3 now  Assessment and plan:  Acute on chronic CHF with midrange EF 43% Severe LVH Abnormal RV free wall strain ~ 17% HCM Hypotension Nonsustained V. tach: lvEF 45-50% in 01/2024, now at 43%, RV mildly reduced-previously endomyocardial biopsy negative for cardiac amyloidosis-Suspect HCM.  Receiving IV Lasix  along with midodrine due to some hypotension> creatinine started to  trend up S/p RHC 10/31-with RV dysfunction (see report) s/ p central line and started dobutamine drip and high-dose Lasix , and metolazone few doses GDMT limited due to hypotension and renal failure. Remains on dobutamine drip-dose decreased to 2, Lasix  stopping and switch to torsemide 40 twice daily 11/6 Advanced heart failure team managing input appreciated>, Dr. Bensimhon discussed with [facility] transplant about referral for heart kidney transplant and they will see him in the outpatient.  RUQ ultrasound done to rule out cirrhosis, report pending Monitor daily I/O,weight, electrolytes and net balance as below.Keep on  salt/fluid restricted diet and monitor in tele. Net IO Since Admission: -6,468.44 mL [04/29/24 1120]  Filed Weights   04/27/24 0500 04/28/24 0448 04/29/24 0604  Weight: 101.7 kg 100.4 kg 99.9 kg    AKI on CKD stage iv Metabolic acidosis Hypokalemia: Creat b/l 1.5-2.5.likely multifactorial initially suspecting AKI on CKD due to GI bleeding, severe anemia, hypotension,RV dysfunction/CHF and need for diuretics> now appears to be CRS-hopefully did not develop ATN.  Now looking into heart and kidney transplant at [facility] Nephro following closely-monitor renal function, replace electrolytes, monitor intake output. Avoid nephrotoxic medications including NSAIDs and iodinated intravenous contrast exposure unless the latter is absolutely indicated.Preferred narcotic agents for pain control jmz:ybimnfnmeynwz, fentanyl , and methadone and avoid morphine.Avoid Baclofen and avoid oral sodium phosphate and magnesium  citrate based laxatives / bowel preps. Continue strict Input and Output monitoring and serial renal functions.  Recent Labs    04/21/24 0319 04/22/24 0404 04/23/24 0445 04/23/24 1058 04/24/24 0500 04/25/24 0547 04/26/24 0500 04/26/24 1650 04/27/24 0535 04/28/24 0628 04/29/24 0422  BUN 77* 71* 70*  --  67* 63* 57* 55* 55* 49* 50*  CREATININE 4.25*  4.49* 4.82*  --  5.08* 5.17*  4.82* 4.88* 4.97* 4.72* 4.78*  CO2 18* 20* 18*  --  20* 20* 25 25 26 25 27   K 3.8 4.0 3.6   < > 3.4* 3.3* 3.0* 3.1* 3.2* 3.3* 3.1*   < > = values in this interval not displayed.   Intake/Output Summary (Last 24 hours) at 04/29/2024 1120 Last data filed at 04/29/2024 9167 Gross per 24 hour  Intake 654.8 ml  Output 1550 ml  Net -895.2 ml   ABLA due to upper GI bleed with friable congested gastric mucosa Hematochezia due to internal hemorrhoids:  history of LA grade a reflux esophagitis and gastritis seen on EGD in 2019. S/p EGD 10/27: congested friable gastric mucosa likely the source of bleeding.  Colonoscopy 10/30-internal hemorrhoids likely cause of hematochezia doubt it accounts for patient's drop in hemoglobin. So far received 3 units PRBC- hb holding >8 gm, S/P IV iron Continue to monitor hemoglobin. Eliquis  resumed 11/1 after GI clearance, cont ppi  Recent Labs  Lab 04/26/24 0500 04/27/24 0535 04/28/24 0628 04/28/24 2030 04/29/24 0422  HGB 7.8* 8.1* 7.9* 8.8* 8.7*  HCT 24.4* 25.7* 24.7* 28.4* 27.1*     Chronic depression Insomnia/delirium overnight: Patient having sleep disorder delirium overnight, placed on trazodone as needed.  Continue home bupropion .     Elevated troponin: Due to demand ischemia.  Plan per cardiology  Hyperlipidemia: continue  crestor    Persistent A-fib: Eliquis  resumed 11/1.Rate is stable monitor in telemetry  DVT prophylaxis: Place TED hose Start: 04/21/24 1237 SCDs Start: 04/17/24 2333 Code Status:   Code Status: Full Code Family Communication: plan of care discussed with patient  Patient status is: Remains hospitalized because of severity of illness Level of care: Progressive transfer to telemetry  Dispo: The patient is from: HOME alone, not married.            Anticipated disposition: Home after cardiac clearance 1-2 days  Objective: Vitals last 24 hrs: Vitals:   04/29/24 0005 04/29/24 0415 04/29/24 0604 04/29/24 0718  BP: 107/70  (!) 106/94  (!) 88/52  Pulse: 65 64  64  Resp: 16 19  19   Temp: 98.2 F (36.8 C) 98.1 F (36.7 C)    TempSrc: Oral Temporal    SpO2: 93% 98%  99%  Weight:   99.9 kg   Height:       Physical Examination: General exam: AAOX3 HEENT:Oral mucosa moist, Ear/Nose WNL grossly Respiratory system: CTTA B/L Cardiovascular system: S1 & S2 +, No JVD. Gastrointestinal system: Abdomen soft,NT,ND, BS+ Nervous System: Alert, awake, moving all extremities Extremities: extremities warm, leg edema neg Skin: Warm, no rashes MSK: Normal muscle bulk,tone, power   Medications reviewed:  Scheduled Meds:  sodium chloride    Intravenous Once   apixaban   5 mg Oral BID   buPROPion   300 mg Oral Daily   Chlorhexidine  Gluconate Cloth  6 each Topical Daily   insulin  aspart  0-9 Units Subcutaneous TID WC   midodrine  15 mg Oral TID WC   pantoprazole   40 mg Oral BID   potassium chloride   40 mEq Oral Q3H   rosuvastatin   20 mg Oral Daily   simethicone   240 mg Oral Once   sodium chloride  flush  10-40 mL Intracatheter Q12H   torsemide  40 mg Oral BID   traZODone  50 mg Oral Daily  Continuous Infusions:  DOBUTamine 2 mcg/kg/min (04/29/24 0909)   iron sucrose 200 mg (04/28/24 1223)  Diet: Diet Order  Diet Heart Room service appropriate? Yes; Fluid consistency: Thin  Diet effective now

## 2024-04-19 NOTE — Anesthesia Preprocedure Evaluation (Addendum)
 Anesthesia Evaluation  Patient identified by MRN, date of birth, ID band Patient awake    Reviewed: Allergy & Precautions, NPO status , Patient's Chart, lab work & pertinent test results, reviewed documented beta blocker date and time   History of Anesthesia Complications Negative for: history of anesthetic complications  Airway Mallampati: II  TM Distance: >3 FB     Dental no notable dental hx.    Pulmonary shortness of breath and with exertion, neg COPD, former smoker   breath sounds clear to auscultation       Cardiovascular Exercise Tolerance: Poor hypertension, (-) angina +CHF  (-) CAD, (-) Past MI and (-) Cardiac Stents + dysrhythmias Atrial Fibrillation  Rhythm:Regular Rate:Bradycardia  IMPRESSIONS     1. Left ventricular ejection fraction, by estimation, is 55 to 60%. The  left ventricle has normal function.   2. Right ventricular systolic function is low normal.   3. Echo performed as a part of biopsy. There is no evidence of  significant pericardial effusion after procedural intervention.      Neuro/Psych  PSYCHIATRIC DISORDERS      CVA    GI/Hepatic ,,,(+) neg Cirrhosis        Endo/Other  diabetes, Type 2    Renal/GU Renal disease     Musculoskeletal   Abdominal   Peds  Hematology  (+) Blood dyscrasia, anemia   Anesthesia Other Findings   Reproductive/Obstetrics                              Anesthesia Physical Anesthesia Plan  ASA: 3  Anesthesia Plan: MAC   Post-op Pain Management:    Induction: Intravenous  PONV Risk Score and Plan: 2 and Ondansetron  and Propofol infusion  Airway Management Planned: Natural Airway and Simple Face Mask  Additional Equipment:   Intra-op Plan:   Post-operative Plan:   Informed Consent: I have reviewed the patients History and Physical, chart, labs and discussed the procedure including the risks, benefits and alternatives  for the proposed anesthesia with the patient or authorized representative who has indicated his/her understanding and acceptance.     Dental advisory given  Plan Discussed with: CRNA  Anesthesia Plan Comments:          Anesthesia Quick Evaluation

## 2024-04-19 NOTE — Progress Notes (Signed)
 At 1200 this patient was noted to have hypotension. Contacted family and provider to update. Provider entered new orders.   04/19/24 1152  Vitals  Temp 97.8 F (36.6 C)  BP (!) 87/48  MAP (mmHg) (!) 60  BP Location Left Arm  BP Method Automatic  Patient Position (if appropriate) Lying  Pulse Rate (!) 48  Pulse Rate Source Monitor  Resp 18  MEWS COLOR  MEWS Score Color Yellow  Oxygen Therapy  SpO2 100 %  O2 Device Room Air  MEWS Score  MEWS Temp 0  MEWS Systolic 1  MEWS Pulse 1  MEWS RR 0  MEWS LOC 0  MEWS Score 2   Will continue to monitor.

## 2024-04-19 NOTE — Anesthesia Postprocedure Evaluation (Signed)
 Anesthesia Post Note  Patient: Keith Leblanc  Procedure(s) Performed: EGD (ESOPHAGOGASTRODUODENOSCOPY)     Patient location during evaluation: PACU Anesthesia Type: MAC Level of consciousness: awake and alert Pain management: pain level controlled Vital Signs Assessment: post-procedure vital signs reviewed and stable Respiratory status: spontaneous breathing, nonlabored ventilation, respiratory function stable and patient connected to nasal cannula oxygen Cardiovascular status: stable and blood pressure returned to baseline Postop Assessment: no apparent nausea or vomiting Anesthetic complications: no   No notable events documented.  Last Vitals:  Vitals:   04/19/24 1158 04/19/24 1304  BP: (!) 87/64 (!) 96/42  Pulse: (!) 41   Resp: 18   Temp:    SpO2: 98%     Last Pain:  Vitals:   04/19/24 1050  TempSrc:   PainSc: 0-No pain                 Lynwood MARLA Cornea

## 2024-04-19 NOTE — Plan of Care (Signed)

## 2024-04-19 NOTE — Progress Notes (Signed)
 Heart Failure Navigator Progress Note  Assessed for Heart & Vascular TOC clinic readiness.  Patient does not meet criteria due to he is an Advanced Heart Failure Team patient.   Navigator will sign off at this time.   Stephane Haddock, BSN, Scientist, Clinical (histocompatibility And Immunogenetics) Only

## 2024-04-19 NOTE — Progress Notes (Signed)
  Cardiology Progress Note  Patient ID: Keith Leblanc MRN: [id number] DOB: [birth year] Date of Encounter: 04/19/2024 Primary Cardiologist: None  Subjective   Chief Complaint: SOB  HPI: Hemoglobin remains low.  Plan for EGD today.  Still short of breath with activity.  ROS:  All other ROS reviewed and negative. Pertinent positives noted in the HPI.     Physical Exam   Vitals:   04/18/24 2033 04/19/24 0630 04/19/24 0741 04/19/24 0910  BP: 99/64 (!) 83/51 103/62 98/69  Pulse: 62 (!) 53 (!) 57 (!) 58  Resp: 17 18 18 12   Temp: 98.3 F (36.8 C) 97.9 F (36.6 C) 98 F (36.7 C) 97.7 F (36.5 C)  TempSrc: Oral Oral  Temporal  SpO2: 100% 97% 99% 100%  Weight:    105.2 kg  Height:    6' 5 (1.956 m)   No intake or output data in the 24 hours ending 04/19/24 0945     04/19/2024    9:10 AM 03/24/2024    3:50 PM 03/23/2024   11:04 AM  Last 3 Weights  Weight (lbs) 232 lb 239 lb 3.2 oz 236 lb  Weight (kg) 105.235 kg 108.5 kg 107.049 kg    Body mass index is 27.51 kg/m.  General: Well nourished, well developed, in no acute distress Head: Atraumatic, normal size  Eyes: PEERLA, EOMI  Neck: Supple, no JVD Endocrine: No thryomegaly Cardiac: Normal S1, S2; irregular rhythm, no murmurs Lungs: Clear to auscultation bilaterally, no wheezing, rhonchi or rales  Abd: Soft, nontender, no hepatomegaly  Ext: 1+ pitting edema Musculoskeletal: No deformities, BUE and BLE strength normal and equal Skin: Warm and dry, no rashes   Neuro: Alert and oriented to person, place, time, and situation, CNII-XII grossly intact, no focal deficits  Psych: Normal mood and affect   Cardiac Studies  CMR 03/16/2024  IMPRESSION: 1. Mild decrease in left ventricular systolic function (LVEF =43%). Myocardial mass index of 90 g/m2, severely elevated with LGE pattern and parametric mapping consistent with cardiac amyloidosis.   2. Low normal right ventricular systolic function (RVEF =47%). Mild increase in  thickness, consistent with cardiac amyloidosis. Dilation of the pulmonary artery with RV dilation by volumetric assessment. Results discussed with primary team.  Patient Profile  Keith Leblanc is a 58 y.o. male with systolic heart failure, hypertrophic cardiomyopathy, persistent A-fib, hypertension, CKD 4 admitted on 04/18/2024 with anemia.  Assessment & Plan   # Anemia - Plan for EGD today with GI. - Holding Eliquis .  # Chronic systolic heart failure, EF 43% # Concerns for hypertrophic cardiomyopathy based on endomyocardial biopsy - He does have some lower extremity edema.  Would recommend to hold diuresis.  Has AKI and here with anemia.  We can plan to diurese him once kidney function is improved anemia has resolved. - BP soft. Hold all meds for now.   # Hypotension  - Related to anemia and heart failure.  Hopefully he will tolerate EGD.  # CKD IV # AKI - Secondary to anemia and heart failure.  Holding further diuresis for now.  # Persistent A-fib - Rate controlled on no medications.  Holding Eliquis  until GI bleed is evaluated.      For questions or updates, please contact [facility] Please consult [email or web address] for contact info under        Signed, Darryle T. Barbaraann, MD, [facility]  [facility]  04/19/2024 9:45 AM

## 2024-04-19 NOTE — Progress Notes (Signed)
 PROGRESS NOTE Keith Leblanc  FMW:[id number] DOB: 1965/11/24 DOA: 04/17/2024 PCP: [name], NP  Brief Narrative/Hospital Course: Keith Leblanc is a 58 y.o. male with PMH of of prior stroke, hypertension, diabetes who presented to the ED due to shortness of breath w/ progressive worsening over the last week and noticed bright red blood in stool , he in ED BP soft, labs showed bicarb 21, creatinine 3.1 baseline around 3, WBC 8.0, hemoglobin 7.5 baseline around 12, troponin 59, 54, INR 1.6.   CXR>>no acute changes.GI was consulted and started on IV PPI BID.  S/P 1 unit of packed RBCs for Hg 6.8. His Eliquis  was held. Last colonoscopy in 2019 which showed diverticulosis and multiple polyps.  Patient endorsed improving shortness of breath following transfusion no chest pain.  Subjective: Seen and examined today Seen after endoscopy resting-o complaint, BP was soft later on and somewhat dizzy per nursing report and given IV fluid bolus to 50 cc Overnight episode of hypotension 83/51 but BP had been soft ranging from 80s to 100 systolic, afebrile,on RA Labs BUN 73 creatinine 3.9 AST ALT remains elevated 73/102 T. bili normal CBC with hemoglobin down to 7.4  Assessment and plan:  Acute blood loss anemia most likely secondary to lower GI bleed: Appreciate GI input received 1 unit PRBC hemoglobin downtrending history of LA grade a reflux esophagitis and gastritis seen on EGD done in 2019. continue IV PPI twice daily Cardiology following for perioperative evaluation. S/p EGD  this morning - congested friable with contact bleeding and nodular mucosa in the gastric body- biopsy taken normal duodenum, suspect patient bleed from diffuse friable gastric mucosa >ok  to resume diet await pathology and okay to resume Eliquis  10/28  Recent Labs    04/17/24 1704 04/18/24 0104 04/18/24 0743 04/18/24 2343 04/19/24 0447  HGB 7.5* 6.8* 7.9* 8.1* 7.4*  MCV 89.2 88.4 88.3 89.1 86.9    Chronic  depression: continue home bupropion    Elevated troponin: Due to demand ischemia.  Hyperlipidemia: continue home rosuvastatin    Persistent A-fib: Eliquis  on hold.   Chronic CHF with midrange EF 43% HCM: Prior to admission on goal-directed medical therapy.  Cardiology following blood pressure has been soft.  Hypertension history Hypotension: Patient having soft blood pressure here.  Antihypertensive remains on hold monitor closely.  Added IV fluid received bolus x 1, add midodrine.  CKD stage IV: continue to trend creatinine.  Recent Labs    03/13/24 0418 03/14/24 0338 03/15/24 0546 03/16/24 0436 03/16/24 0806 03/17/24 0556 03/18/24 0357 03/23/24 1138 03/24/24 1624 04/17/24 1704 04/19/24 0447  BUN 24* 23* 21* 22*  --  20 24* 27* 31* 57* 73*  CREATININE 2.63* 3.16* 3.08* 2.79*  --  3.05* 2.88* 2.42* 2.47* 3.13* 3.97*  CO2 26 30 33* 32  --  32 28 30 25  21* 21*  K 3.2* 3.5 3.3* 3.7   < > 4.1 4.7 4.4 4.3 4.4 4.3   < > = values in this interval not displayed.   DVT prophylaxis: SCDs Start: 04/17/24 2333 Code Status:   Code Status: Full Code Family Communication: plan of care discussed with patient and his wife at bedside. Patient status is: Remains hospitalized because of severity of illness Level of care: Med-Surg   Dispo: The patient is from: HOME            Anticipated disposition: TBD Objective: Vitals last 24 hrs: Vitals:   04/19/24 1034 04/19/24 1040 04/19/24 1044 04/19/24 1050  BP:  91/70  100/78  Pulse: ROLLEN)  49 60 (!) 53 (!) 53  Resp: (!) 26 (!) 24 15 (!) 21  Temp:      TempSrc:      SpO2: 100% 97% 100% 100%  Weight:      Height:        Physical Examination: General exam: alert awake, oriented, older than stated age HEENT:Oral mucosa moist, Ear/Nose WNL grossly Respiratory system: Bilaterally clear BS,no use of accessory muscle Cardiovascular system: S1 & S2 +, No JVD. Gastrointestinal system: Abdomen soft,NT,ND, BS+ Nervous System: Alert, awake,  moving all extremities,and following commands. Extremities: extremities warm, leg edema NEG Skin: Warm, no rashes MSK: Normal muscle bulk,tone, power   Medications reviewed:  Scheduled Meds:  sodium chloride    Intravenous Once   buPROPion   300 mg Oral Daily   pantoprazole  (PROTONIX ) IV  40 mg Intravenous Q12H   rosuvastatin   20 mg Oral Daily   Continuous Infusions: Diet: Diet Order             Diet Heart Room service appropriate? Yes; Fluid consistency: Thin  Diet effective now                    Data Reviewed: I have personally reviewed following labs and imaging studies ( see epic result tab) CBC: Recent Labs  Lab 04/17/24 1704 04/18/24 0104 04/18/24 0743 04/18/24 2343 04/19/24 0447  WBC 8.0 8.3 9.2 10.6* 7.3  HGB 7.5* 6.8* 7.9* 8.1* 7.4*  HCT 24.8* 22.1* 25.0* 26.1* 23.8*  MCV 89.2 88.4 88.3 89.1 86.9  PLT 465* 432* 424* 471* 404*   CMP: Recent Labs  Lab 04/17/24 1704 04/19/24 0447  NA 137 138  K 4.4 4.3  CL 105 105  CO2 21* 21*  GLUCOSE 174* 150*  BUN 57* 73*  CREATININE 3.13* 3.97*  CALCIUM  9.1 8.8*  MG  --  2.3  PHOS  --  6.2*   GFR: Estimated Creatinine Clearance: 26.2 mL/min (A) (by C-G formula based on SCr of 3.97 mg/dL (H)). Recent Labs  Lab 04/18/24 2343 04/19/24 0447  AST 71* 73*  ALT 91* 102*  ALKPHOS 104 92  BILITOT 1.2 1.0  PROT 5.6* 5.3*  ALBUMIN 2.7* 2.4*   No results for input(s): LIPASE, AMYLASE in the last 168 hours. No results for input(s): AMMONIA in the last 168 hours. Coagulation Profile:  Recent Labs  Lab 04/17/24 2253  INR 1.6*   Unresulted Labs (From admission, onward)     Start     Ordered   04/20/24 0500  Basic metabolic panel with GFR  Daily,   R     Question:  Specimen collection method  Answer:  Lab=Lab collect   04/19/24 1124   04/20/24 0500  CBC  Daily,   R     Question:  Specimen collection method  Answer:  Lab=Lab collect   04/19/24 1124   04/19/24 2100  Hemoglobin and hematocrit, blood  Once,    R       Question:  Specimen collection method  Answer:  Lab=Lab collect   04/19/24 1124           Antimicrobials/Microbiology: Anti-infectives (From admission, onward)    None         Component Value Date/Time   SDES BLOOD LEFT ANTECUBITAL 07/07/2016 1225   SPECREQUEST BOTTLES DRAWN AEROBIC AND ANAEROBIC 5CC 07/07/2016 1225   CULT  07/07/2016 1225    NO GROWTH 5 DAYS Performed at [facility], [address], [place], [place] [address]  REPTSTATUS 07/12/2016 FINAL 07/07/2016 1225    Procedures: Procedure(s) (LRB): EGD (ESOPHAGOGASTRODUODENOSCOPY) (N/A)   Mennie LAMY, MD [facility] 04/19/2024, 1:49 PM

## 2024-04-19 NOTE — Op Note (Signed)
 [facility] Patient Name: Keith Leblanc Procedure Date : 04/19/2024 MRN: [id number] Attending MD: Glendia BRAVO. Stacia , MD, [id number] Date of Birth: [birth year] CSN: [id number] Age: 58 Admit Type: Inpatient Procedure:                Upper GI endoscopy Indications:              Acute post hemorrhagic anemia, Melena Providers:                Glendia E. Stacia, MD, Mliss Eagles, RN,                            Haskel Chris, Technician Referring MD:              Medicines:                Monitored Anesthesia Care Complications:            No immediate complications. Estimated Blood Loss:     Estimated blood loss was minimal. Procedure:                Pre-Anesthesia Assessment:                           - Prior to the procedure, a History and Physical                            was performed, and patient medications and                            allergies were reviewed. The patient's tolerance of                            previous anesthesia was also reviewed. The risks                            and benefits of the procedure and the sedation                            options and risks were discussed with the patient.                            All questions were answered, and informed consent                            was obtained. Prior Anticoagulants: The patient has                            taken Eliquis  (apixaban ), last dose was 3 days                            prior to procedure. ASA Grade Assessment: IV - A                            patient with severe systemic disease that is a  constant threat to life. After reviewing the risks                            and benefits, the patient was deemed in                            satisfactory condition to undergo the procedure.                           After obtaining informed consent, the endoscope was                            passed under direct vision. Throughout the                             procedure, the patient's blood pressure, pulse, and                            oxygen saturations were monitored continuously. The                            [id number]-H190 ([id number]) Olympus endoscope was introduced                            through the mouth, and advanced to the second part                            of duodenum. The upper GI endoscopy was                            accomplished without difficulty. The patient                            tolerated the procedure well. Scope In: Scope Out: Findings:      The Z-line was irregular.      The exam of the esophagus was otherwise normal.      Diffuse moderate mucosal changes characterized by congestion, friability       (with contact bleeding) and nodularity were found in the gastric body.       There was a clear transition from normal to abnormal mucosa at the       gastric antrum. Biopsies were taken with a cold forceps for Helicobacter       pylori testing. Estimated blood loss was minimal.      The exam of the stomach was otherwise normal.      Biopsies were taken with a cold forceps in the gastric antrum for       Helicobacter pylori testing. Estimated blood loss was minimal.      The examined duodenum was normal. Impression:               - Z-line irregular.                           - Congested, friable (with contact bleeding) and  nodular mucosa in the gastric body. Biopsied.                            Etiology of abnormal mucosa unclear, but H. pylori                            possible.                           - Normal examined duodenum.                           - Biopsies were taken with a cold forceps for                            Helicobacter pylori testing.                           - Suspect patient bled from diffuse friable gastric                            mucosa. No high risk bleeding lesions present. Moderate Sedation:      N/A Recommendation:           - Return  patient to hospital ward for ongoing care.                           - Resume previous diet.                           - Await pathology results.                           - Ok to restart Eliquis  tomorrow                           - Would continue PPI to reduce risk of ongoing                            bleeding/oozing. Procedure Code(s):        --- Professional ---                           [phone], Esophagogastroduodenoscopy, flexible,                            transoral; with biopsy, single or multiple Diagnosis Code(s):        --- Professional ---                           K22.89, Other specified disease of esophagus                           K21.00, Gastro-esophageal reflux disease with                            esophagitis, without bleeding  K92.2, Gastrointestinal hemorrhage, unspecified                           K31.89, Other diseases of stomach and duodenum                           D62, Acute posthemorrhagic anemia                           K92.1, Melena (includes Hematochezia) CPT copyright 2022 American Medical Association. All rights reserved. The codes documented in this report are preliminary and upon coder review may  be revised to meet current compliance requirements. Truxton Stupka E. Stacia, MD 04/19/2024 10:38:36 AM This report has been signed electronically. Number of Addenda: 0

## 2024-04-19 NOTE — Interval H&P Note (Signed)
 History and Physical Interval Note:  04/19/2024 9:35 AM  Keith Leblanc  has presented today for surgery, with the diagnosis of Anemia, melena.  The various methods of treatment have been discussed with the patient and family. After consideration of risks, benefits and other options for treatment, the patient has consented to  Procedure(s): EGD (ESOPHAGOGASTRODUODENOSCOPY) (N/A) as a surgical intervention.  The patient's history has been reviewed, patient examined, no change in status, stable for surgery.  I have reviewed the patient's chart and labs.  Questions were answered to the patient's satisfaction.     Glendia FORBES Holt

## 2024-04-19 NOTE — Transfer of Care (Signed)
 Immediate Anesthesia Transfer of Care Note  Patient: Keith Leblanc  Procedure(s) Performed: EGD (ESOPHAGOGASTRODUODENOSCOPY)  Patient Location: Endoscopy Unit  Anesthesia Type:General  Level of Consciousness: drowsy  Airway & Oxygen Therapy: Patient Spontanous Breathing  Post-op Assessment: Report given to RN and Post -op Vital signs reviewed and stable  Post vital signs: Reviewed and stable  Last Vitals:  Vitals Value Taken Time  BP 87/63 04/19/24 10:30  Temp    Pulse 56 04/19/24 10:31  Resp 18 04/19/24 10:31  SpO2 100 % 04/19/24 10:31  Vitals shown include unfiled device data.  Last Pain:  Vitals:   04/19/24 0910  TempSrc: Temporal  PainSc: 0-No pain         Complications: No notable events documented.

## 2024-04-20 ENCOUNTER — Telehealth (HOSPITAL_COMMUNITY): Payer: Self-pay

## 2024-04-20 DIAGNOSIS — K922 Gastrointestinal hemorrhage, unspecified: Secondary | ICD-10-CM | POA: Diagnosis not present

## 2024-04-20 DIAGNOSIS — Z7901 Long term (current) use of anticoagulants: Secondary | ICD-10-CM

## 2024-04-20 DIAGNOSIS — K2971 Gastritis, unspecified, with bleeding: Secondary | ICD-10-CM | POA: Diagnosis not present

## 2024-04-20 DIAGNOSIS — N179 Acute kidney failure, unspecified: Secondary | ICD-10-CM | POA: Diagnosis not present

## 2024-04-20 DIAGNOSIS — I5041 Acute combined systolic (congestive) and diastolic (congestive) heart failure: Secondary | ICD-10-CM

## 2024-04-20 DIAGNOSIS — D62 Acute posthemorrhagic anemia: Secondary | ICD-10-CM | POA: Diagnosis not present

## 2024-04-20 DIAGNOSIS — K921 Melena: Secondary | ICD-10-CM | POA: Diagnosis not present

## 2024-04-20 DIAGNOSIS — I5021 Acute systolic (congestive) heart failure: Secondary | ICD-10-CM | POA: Diagnosis not present

## 2024-04-20 DIAGNOSIS — N184 Chronic kidney disease, stage 4 (severe): Secondary | ICD-10-CM

## 2024-04-20 DIAGNOSIS — I5023 Acute on chronic systolic (congestive) heart failure: Secondary | ICD-10-CM

## 2024-04-20 DIAGNOSIS — I5043 Acute on chronic combined systolic (congestive) and diastolic (congestive) heart failure: Secondary | ICD-10-CM | POA: Insufficient documentation

## 2024-04-20 LAB — CBC
HCT: 21.9 % — ABNORMAL LOW (ref 39.0–52.0)
Hemoglobin: 7 g/dL — ABNORMAL LOW (ref 13.0–17.0)
MCH: 27.1 pg (ref 26.0–34.0)
MCHC: 32 g/dL (ref 30.0–36.0)
MCV: 84.9 fL (ref 80.0–100.0)
Platelets: 410 K/uL — ABNORMAL HIGH (ref 150–400)
RBC: 2.58 MIL/uL — ABNORMAL LOW (ref 4.22–5.81)
RDW: 15.3 % (ref 11.5–15.5)
WBC: 7.9 K/uL (ref 4.0–10.5)
nRBC: 1.8 % — ABNORMAL HIGH (ref 0.0–0.2)

## 2024-04-20 LAB — BASIC METABOLIC PANEL WITH GFR
Anion gap: 12 (ref 5–15)
BUN: 76 mg/dL — ABNORMAL HIGH (ref 6–20)
CO2: 19 mmol/L — ABNORMAL LOW (ref 22–32)
Calcium: 8.6 mg/dL — ABNORMAL LOW (ref 8.9–10.3)
Chloride: 107 mmol/L (ref 98–111)
Creatinine, Ser: 4 mg/dL — ABNORMAL HIGH (ref 0.61–1.24)
GFR, Estimated: 17 mL/min — ABNORMAL LOW (ref >60)
Glucose, Bld: 155 mg/dL — ABNORMAL HIGH (ref 70–99)
Potassium: 3.7 mmol/L (ref 3.5–5.1)
Sodium: 138 mmol/L (ref 135–145)

## 2024-04-20 LAB — SURGICAL PATHOLOGY

## 2024-04-20 LAB — PREPARE RBC (CROSSMATCH)

## 2024-04-20 MED ORDER — MIDODRINE HCL 5 MG PO TABS
10.0000 mg | ORAL_TABLET | Freq: Three times a day (TID) | ORAL | Status: DC
  Administered 2024-04-20 – 2024-04-21 (×3): 10 mg via ORAL
  Filled 2024-04-20 (×3): qty 2

## 2024-04-20 MED ORDER — SIMETHICONE 80 MG PO CHEW
240.0000 mg | CHEWABLE_TABLET | Freq: Once | ORAL | Status: AC
  Administered 2024-04-21: 240 mg via ORAL
  Filled 2024-04-20: qty 3

## 2024-04-20 MED ORDER — SODIUM CHLORIDE 0.9 % IV SOLN: INTRAVENOUS | Status: AC

## 2024-04-20 MED ORDER — SIMETHICONE 80 MG PO CHEW
240.0000 mg | CHEWABLE_TABLET | Freq: Once | ORAL | Status: DC
  Filled 2024-04-20: qty 3

## 2024-04-20 MED ORDER — NA SULFATE-K SULFATE-MG SULF 17.5-3.13-1.6 GM/177ML PO SOLN
0.5000 | Freq: Once | ORAL | Status: DC
  Filled 2024-04-20: qty 1

## 2024-04-20 MED ORDER — SIMETHICONE 80 MG PO CHEW: 240.0000 mg | CHEWABLE_TABLET | Freq: Once | ORAL | Status: DC

## 2024-04-20 MED ORDER — NA SULFATE-K SULFATE-MG SULF 17.5-3.13-1.6 GM/177ML PO SOLN
0.5000 | Freq: Once | ORAL | Status: AC
  Administered 2024-04-21: 177 mL via ORAL
  Filled 2024-04-20: qty 1

## 2024-04-20 MED ORDER — SODIUM CHLORIDE 0.9% IV SOLUTION: Freq: Once | INTRAVENOUS | Status: AC

## 2024-04-20 MED ORDER — PANTOPRAZOLE SODIUM 40 MG PO TBEC
40.0000 mg | DELAYED_RELEASE_TABLET | Freq: Two times a day (BID) | ORAL | Status: DC
  Administered 2024-04-20 – 2024-05-03 (×25): 40 mg via ORAL
  Filled 2024-04-20 (×26): qty 1

## 2024-04-20 MED ORDER — FUROSEMIDE 10 MG/ML IJ SOLN
80.0000 mg | Freq: Once | INTRAMUSCULAR | Status: AC
  Administered 2024-04-20: 80 mg via INTRAVENOUS
  Filled 2024-04-20: qty 8

## 2024-04-20 NOTE — Progress Notes (Signed)
 [facility] ROUNDING NOTE   Subjective: Patient feeling okay today.  Thinks his dyspnea may be a little better after getting a unit.  Has not had much of a response to the Lasix  so far.  Continues to not have much of an appetite, but he thinks that this is because it will make him go to the bathroom, which worsens his dyspnea.  Denies significant abdominal pain, no nausea or vomiting.  Passed some stool last night which was reportedly dark in color.  Hemoglobin drifted down to 7 today, prompting 1 unit PRBC.   Objective: Vital signs in last 24 hours: Temp:  [97.4 F (36.3 C)-98.1 F (36.7 C)] 98.1 F (36.7 C) (10/28 1558) Pulse Rate:  [51-66] 61 (10/28 1558) Resp:  [16-20] 18 (10/28 1425) BP: (87-97)/(57-69) 94/66 (10/28 1600) SpO2:  [100 %] 100 % (10/28 1558) Last BM Date : 04/20/24 General: NAD, pleasant African-American male, sister at bedside Lungs:  CTA b/l, no w/r/r Heart:  RRR, no m/r/g Abdomen:  Soft, NT, ND, +BS Ext: Trace edema    Intake/Output from previous day: 10/27 0701 - 10/28 0700 In: 1400 [P.O.:1050; I.V.:100; IV Piggyback:250] Out: -  Intake/Output this shift: Total I/O In: 364 [I.V.:10; Blood:354] Out: -    Lab Results: Recent Labs    04/18/24 2343 04/19/24 0447 04/19/24 2314 04/20/24 0435  WBC 10.6* 7.3  --  7.9  HGB 8.1* 7.4* 7.1* 7.0*  PLT 471* 404*  --  410*  MCV 89.1 86.9  --  84.9   BMET Recent Labs    04/17/24 1704 04/19/24 0447 04/20/24 0435  NA 137 138 138  K 4.4 4.3 3.7  CL 105 105 107  CO2 21* 21* 19*  GLUCOSE 174* 150* 155*  BUN 57* 73* 76*  CREATININE 3.13* 3.97* 4.00*  CALCIUM  9.1 8.8* 8.6*   LFT Recent Labs    04/18/24 2343 04/19/24 0447  PROT 5.6* 5.3*  ALBUMIN 2.7* 2.4*  AST 71* 73*  ALT 91* 102*  ALKPHOS 104 92  BILITOT 1.2 1.0  BILIDIR 0.3*  --   IBILI 0.9  --    PT/INR Recent Labs    04/17/24 2253  INR 1.6*      Imaging/Other results: No results found.    Assessment and  Plan:  58 year old male with chronic systolic heart failure with concerns for hypertrophic cardiomyopathy, admitted with symptomatic anemia in the setting of chronic dark stools.  Hemoglobin on admission 7.5 down from 11 three weeks prior. Hemoglobin has drifted slowly and he continues to have dark-colored stools. EGD 10/27 showed diffusely congested, friable gastric mucosa involving the gastric body.  Antrum appeared normal.  Biopsies showed nonspecific edema and hyperplastic changes.  No H. pylori noted.  Acute blood loss anemia Although I suspect that the patient's anemia was secondary to diffuse gastritis/mucosal friability noted on the EGD, it is reasonable to proceed with a colonoscopy prior to discharge to more definitively exclude the colon as a bleeding source.  The patient is due for his colon cancer screening/polyp surveillance colonoscopy anyway.  We would be able to accomplish this now why he is still holding his Eliquis , and he would not have to worry about this as an outpatient. The patient was agreeable to proceed with a colonoscopy during his hospitalization.  We will plan for colonoscopy on Thursday. - Clear liquid diet starting tomorrow - Bowel prep Wednesday night - Colonoscopy Thursday - Continue to hold Eliquis    Gastritis/mucosal friability Suspected source of GI blood loss.  No H. pylori or other specific changes noted. -Continue Protonix   Systolic heart failure/hypertrophic cardiomyopathy -Management per cardiology with midodrine 10 mg 3 times daily -Given Lasix  80 mg IV x 1 today -Holding off on GDMT given anemia  CKD stage IV-V - Stable  A-fib - Rate controlled, holding Eliquis  for now  The details, risks (including bleeding, perforation, infection, missed lesions, medication reactions and possible hospitalization or surgery if complications occur), benefits, and alternatives to colonoscopy with possible biopsy and possible polypectomy were discussed with the  patient and he consents to proceed.    Glendia FORBES Holt, MD  04/20/2024, 5:00 PM [facility]   Moderate complex medical decision making (this includes chart review, review of results, face-to-face time used for counseling as well as treatment plan and follow-up. The patient was provided an opportunity to ask questions and all were answered. The patient agreed with the plan and demonstrated an understanding of the instructions

## 2024-04-20 NOTE — Progress Notes (Signed)
 Pt consent signed at [phone] and in chart. Preblood VS taken as below.   04/20/24 1058  Vitals  Temp 98 F (36.7 C)  Pulse Rate 60  Resp 16  BP (!) 89/60  Oxygen Therapy  SpO2 100 %  O2 Device Room [facility]

## 2024-04-20 NOTE — Progress Notes (Signed)
 Pt signed colonoscopy informed consent and received printed patient education at 1750. Informed consent placed in chart.

## 2024-04-20 NOTE — Progress Notes (Signed)
 Cardiology Progress Note  Patient ID: Keith Leblanc MRN: [id number] DOB: [birth year] Date of Encounter: 04/20/2024 Primary Cardiologist: None  Subjective   Chief Complaint: Fatigue   HPI: Reports fatigue and shortness of breath with activity.  ROS:  All other ROS reviewed and negative. Pertinent positives noted in the HPI.     Physical Exam   Vitals:   04/19/24 1609 04/19/24 1718 04/19/24 1955 04/20/24 0828  BP: 90/63 (!) 96/57 96/65 94/62   Pulse: (!) 59 (!) 59 (!) 54 66  Resp:   17 16  Temp:    98 F (36.7 C)  TempSrc:      SpO2: 100%  100% 100%  Weight:      Height:        Intake/Output Summary (Last 24 hours) at 04/20/2024 0901 Last data filed at 04/20/2024 0600 Gross per 24 hour  Intake 1400 ml  Output --  Net 1400 ml       04/19/2024    9:10 AM 03/24/2024    3:50 PM 03/23/2024   11:04 AM  Last 3 Weights  Weight (lbs) 232 lb 239 lb 3.2 oz 236 lb  Weight (kg) 105.235 kg 108.5 kg 107.049 kg    Body mass index is 27.51 kg/m.  General: Well nourished, well developed, in no acute distress Head: Atraumatic, normal size  Eyes: PEERLA, EOMI  Neck: Supple, JVD 8-10 cmH2O Endocrine: No thryomegaly Cardiac: Normal S1, S2; irregular rhythm Lungs: Clear to auscultation bilaterally, no wheezing, rhonchi or rales  Abd: Soft, nontender, no hepatomegaly  Ext: 1+ pitting edema Musculoskeletal: No deformities, BUE and BLE strength normal and equal Skin: Warm and dry, no rashes   Neuro: Alert and oriented to person, place, time, and situation, CNII-XII grossly intact, no focal deficits  Psych: Normal mood and affect   Cardiac Studies  CMR 03/16/2024 IMPRESSION: 1. Mild decrease in left ventricular systolic function (LVEF =43%). Myocardial mass index of 90 g/m2, severely elevated with LGE pattern and parametric mapping consistent with cardiac amyloidosis.   2. Low normal right ventricular systolic function (RVEF =47%). Mild increase in thickness, consistent with  cardiac amyloidosis. Dilation of the pulmonary artery with RV dilation by volumetric assessment. Results discussed with primary team.  Patient Profile  Keith Leblanc is a 58 y.o. male with systolic heart failure, hypertrophic cardiomyopathy, persistent A-fib, hypertension, CKD 4 admitted on 04/18/2024 with anemia.   Assessment & Plan   # Acute GI bleed # Acute blood loss anemia - Hemoglobin 7.0 today.  Transfusion ordered.  We will give him Lasix  with his transfusion.  I have ordered 80 mg as a one-time dose. - GI has given the go ahead for Eliquis .  Would recommend to hold for 1 more day.  Do not want to cause any more bleeding. - No rush to get back on Eliquis .  On PPI.  # Chronic systolic heart failure, EF 43% # Hypertrophic cardiomyopathy based on endomyocardial biopsy - With AKI.  CKD stage IV.  Creatinine likely has reached its plateau.  We will give one-time dose of 80 mg of IV Lasix .  He is not that volume overloaded but would benefit from some diuresis. - Blood pressure a bit low.  On midodrine 10 mg 3 times daily.  Low diastolic blood pressure.  I do not believe he is in cardiogenic shock.  I suspect he is just simply hypotensive from acute blood loss anemia.  Hopefully this will improve with transfusion.  No GDMT.  # AKI # CKD IV -  Serum creatinine 4.0 today.  Suspect this is the plateaued.  One-time dose of IV Lasix  today.  # Persistent A-fib -Rate controlled on medications.  Holding Eliquis  until he no longer requires transfusions.    For questions or updates, please contact [facility] Please consult [email or web address] for contact info under        Signed, Darryle T. Barbaraann, MD, [facility]  [facility]  04/20/2024 9:01 AM

## 2024-04-20 NOTE — Plan of Care (Signed)

## 2024-04-20 NOTE — Progress Notes (Addendum)
 PROGRESS NOTE Keith Leblanc  FMW:[id number] DOB: 1965-11-12 DOA: 04/17/2024 PCP: Keith Huxley, Keith Leblanc  Brief Narrative/Hospital Course: Keith Leblanc is a 58 y.o. male with PMH of of prior stroke, hypertension, diabetes who presented to the ED due to shortness of breath w/ progressive worsening over the last week and noticed bright red blood in stool , he in ED BP soft, labs showed bicarb 21, creatinine 3.1 baseline around 3, WBC 8.0, hemoglobin 7.5 baseline around 12, troponin 59, 54, INR 1.6.   CXR>>no acute changes.GI was consulted and started on IV PPI BID.  S/P 1 unit of packed RBCs for Hg 6.8. His Eliquis  was held. Last colonoscopy in 2019 which showed diverticulosis and multiple polyps.  Patient endorsed improving shortness of breath following transfusion no chest pain. Underwent EGD 10/27 has congested friable gastric mucosa likely the source of bleeding advised to continue PPI okay to resume Eliquis  10/28  Subjective: Seen and examined today On edge of bed. BP has been variable needed IV fluid bolus 10/27 post EGD Creatinine up 4 BUN 76 hemoglobin downtrending 7.0-patient is symptomatic  and feeeling weak  Still stool mixed with blood ( but not new)  Assessment and plan:  Acute blood loss anemia most likely secondary to lower GI bleed: Appreciate GI input received 1 unit PRBC hemoglobin downtrending history of LA grade a reflux esophagitis and gastritis seen on EGD done in 2019. Cardiology input appreciated, signed off  S/p EGD 10/27 w showed -congested friable with contact bleeding and nodular mucosa in the gastric body- biopsy taken normal duodenum, suspect patient bleed from diffuse friable gastric mucosa > continue diet ADAT, as per GI okay resume Eliquis  to start it but hemoglobin is low and he agrees to hold one moe day and do 1 u PRBC todday as he is symptomatic. Recent Labs    04/18/24 0743 04/18/24 2343 04/19/24 0447 04/19/24 2314 04/20/24 0435  HGB 7.9* 8.1* 7.4*  7.1* 7.0*  MCV 88.3 89.1 86.9  --  84.9   Chronic CHF with midrange EF 43% HCM Hypertension history Hypotension: Prior to admission on goal-directed medical therapy, cardiology is following closely, will transfuse 1 PRBC along with IV Lasix , patient placed on midodrine 10 3 times daily as needed will schedule as he has been getting it.  Continue to hold antihypertensives and GDMT  Cont to monitor daily I/O,weight, electrolytes and net balance as below.Keep on  salt/fluid restricted diet and monitor in tele.Net IO Since Admission: 1,643 mL [04/20/24 1003]  Filed Weights   04/19/24 0910  Weight: 105.2 kg    Recent Labs  Lab 04/17/24 1704 04/19/24 0447 04/20/24 0435  BNP 1,324.0*  --   --   BUN 57* 73* 76*  CREATININE 3.13* 3.97* 4.00*  K 4.4 4.3 3.7  MG  --  2.3  --     Chronic depression: continue home bupropion    Elevated troponin: Due to demand ischemia.  Hyperlipidemia: continue home rosuvastatin    Persistent A-fib: Eliquis  on hold.   CKD stage V: Creatinine has been fluctuating CKD 5 versus AKI on CKD 4-monitor-closely continue diuresis and blood transfusion as above.  Recent Labs    03/14/24 0338 03/15/24 0546 03/16/24 0436 03/16/24 0806 03/17/24 0556 03/18/24 0357 03/23/24 1138 03/24/24 1624 04/17/24 1704 04/19/24 0447 04/20/24 0435  BUN 23* 21* 22*  --  20 24* 27* 31* 57* 73* 76*  CREATININE 3.16* 3.08* 2.79*  --  3.05* 2.88* 2.42* 2.47* 3.13* 3.97* 4.00*  CO2 30 33* 32  --  32 28 30 25  21* 21* 19*  K 3.5 3.3* 3.7   < > 4.1 4.7 4.4 4.3 4.4 4.3 3.7   < > = values in this interval not displayed.   DVT prophylaxis: SCDs Start: 04/17/24 2333 Code Status:   Code Status: Full Code Family Communication: plan of care discussed with patient and his siter was updated at bedside 10/27. Patient status is: Remains hospitalized because of severity of illness Level of care: Med-Surg   Dispo: The patient is from: HOME alone.            Anticipated disposition:  TBD Objective: Vitals last 24 hrs: Vitals:   04/19/24 1609 04/19/24 1718 04/19/24 1955 04/20/24 0828  BP: 90/63 (!) 96/57 96/65 94/62   Pulse: (!) 59 (!) 59 (!) 54 66  Resp:   17 16  Temp:    98 F (36.7 C)  TempSrc:      SpO2: 100%  100% 100%  Weight:      Height:        Physical Examination: General exam: alert awake, oriented, pleasant. HEENT:Oral mucosa moist, Ear/Nose WNL grossly Respiratory system: Bilaterally clear BS,no use of accessory muscle Cardiovascular system: S1 & S2 +, No JVD. Gastrointestinal system: Abdomen soft,NT,ND, BS+ Nervous System: Alert, awake, moving all extremities,and following commands. Extremities: extremities warm, leg edema NEG Skin: Warm, no rashes MSK: Normal muscle bulk,tone, power   Medications reviewed:  Scheduled Meds:  sodium chloride    Intravenous Once   sodium chloride    Intravenous Once   buPROPion   300 mg Oral Daily   furosemide   80 mg Intravenous Once   midodrine  10 mg Oral TID WC   pantoprazole  (PROTONIX ) IV  40 mg Intravenous Q12H   rosuvastatin   20 mg Oral Daily   Continuous Infusions:  sodium chloride      Diet: Diet Order             Diet Heart Room service appropriate? Yes; Fluid consistency: Thin  Diet effective now                    Data Reviewed: I have personally reviewed following labs and imaging studies ( see epic result tab) CBC: Recent Labs  Lab 04/18/24 0104 04/18/24 0743 04/18/24 2343 04/19/24 0447 04/19/24 2314 04/20/24 0435  WBC 8.3 9.2 10.6* 7.3  --  7.9  HGB 6.8* 7.9* 8.1* 7.4* 7.1* 7.0*  HCT 22.1* 25.0* 26.1* 23.8* 22.5* 21.9*  MCV 88.4 88.3 89.1 86.9  --  84.9  PLT 432* 424* 471* 404*  --  410*   CMP: Recent Labs  Lab 04/17/24 1704 04/19/24 0447 04/20/24 0435  NA 137 138 138  K 4.4 4.3 3.7  CL 105 105 107  CO2 21* 21* 19*  GLUCOSE 174* 150* 155*  BUN 57* 73* 76*  CREATININE 3.13* 3.97* 4.00*  CALCIUM  9.1 8.8* 8.6*  MG  --  2.3  --   PHOS  --  6.2*  --    GFR:  Estimated Creatinine Clearance: 26 mL/min (A) (by C-G formula based on SCr of 4 mg/dL (H)). Recent Labs  Lab 04/18/24 2343 04/19/24 0447  AST 71* 73*  ALT 91* 102*  ALKPHOS 104 92  BILITOT 1.2 1.0  PROT 5.6* 5.3*  ALBUMIN 2.7* 2.4*   No results for input(s): LIPASE, AMYLASE in the last 168 hours. No results for input(s): AMMONIA in the last 168 hours. Coagulation Profile:  Recent Labs  Lab 04/17/24 2253  INR 1.6*  Unresulted Labs (From admission, onward)     Start     Ordered   04/20/24 0500  Basic metabolic panel with GFR  Daily,   R     Question:  Specimen collection method  Answer:  Lab=Lab collect   04/19/24 1124   04/20/24 0500  CBC  Daily,   R     Question:  Specimen collection method  Answer:  Lab=Lab collect   04/19/24 1124           Antimicrobials/Microbiology: Anti-infectives (From admission, onward)    None         Component Value Date/Time   SDES BLOOD LEFT ANTECUBITAL 07/07/2016 1225   SPECREQUEST BOTTLES DRAWN AEROBIC AND ANAEROBIC 5CC 07/07/2016 1225   CULT  07/07/2016 1225    NO GROWTH 5 DAYS Performed at [facility], [address], [place], [place] [address]    REPTSTATUS 07/12/2016 FINAL 07/07/2016 1225    Procedures: Procedure(s) (LRB): EGD (ESOPHAGOGASTRODUODENOSCOPY) (N/A)   Mennie LAMY, MD [facility] 04/20/2024, 10:03 AM

## 2024-04-20 NOTE — Telephone Encounter (Signed)
 Called to confirm/remind patient of their appointment at [facility] on 04/21/24.   Appointment:   [] Confirmed  [x] Left mess   [] No answer/No voice mail  [] VM Full/unable to leave message  [] Phone not in service  And to bring in all medications and/or complete list.

## 2024-04-21 ENCOUNTER — Inpatient Hospital Stay (HOSPITAL_COMMUNITY)

## 2024-04-21 ENCOUNTER — Encounter (HOSPITAL_COMMUNITY): Payer: Self-pay | Admitting: Gastroenterology

## 2024-04-21 ENCOUNTER — Encounter (HOSPITAL_COMMUNITY)

## 2024-04-21 ENCOUNTER — Ambulatory Visit: Payer: Self-pay | Admitting: Gastroenterology

## 2024-04-21 DIAGNOSIS — I5021 Acute systolic (congestive) heart failure: Secondary | ICD-10-CM | POA: Diagnosis not present

## 2024-04-21 DIAGNOSIS — I5020 Unspecified systolic (congestive) heart failure: Secondary | ICD-10-CM

## 2024-04-21 DIAGNOSIS — D62 Acute posthemorrhagic anemia: Secondary | ICD-10-CM | POA: Diagnosis not present

## 2024-04-21 DIAGNOSIS — Z7901 Long term (current) use of anticoagulants: Secondary | ICD-10-CM | POA: Diagnosis not present

## 2024-04-21 DIAGNOSIS — I5043 Acute on chronic combined systolic (congestive) and diastolic (congestive) heart failure: Secondary | ICD-10-CM | POA: Diagnosis not present

## 2024-04-21 DIAGNOSIS — K921 Melena: Secondary | ICD-10-CM | POA: Diagnosis not present

## 2024-04-21 DIAGNOSIS — K922 Gastrointestinal hemorrhage, unspecified: Secondary | ICD-10-CM | POA: Diagnosis not present

## 2024-04-21 DIAGNOSIS — K2971 Gastritis, unspecified, with bleeding: Secondary | ICD-10-CM | POA: Diagnosis not present

## 2024-04-21 LAB — LACTIC ACID, PLASMA: Lactic Acid, Venous: 1.5 mmol/L (ref 0.5–1.9)

## 2024-04-21 LAB — ECHOCARDIOGRAM COMPLETE
Height: 77 in
S' Lateral: 3.4 cm
Weight: 3712 [oz_av]

## 2024-04-21 LAB — CBC
HCT: 23.7 % — ABNORMAL LOW (ref 39.0–52.0)
Hemoglobin: 7.6 g/dL — ABNORMAL LOW (ref 13.0–17.0)
MCH: 27 pg (ref 26.0–34.0)
MCHC: 32.1 g/dL (ref 30.0–36.0)
MCV: 84.3 fL (ref 80.0–100.0)
Platelets: 396 K/uL (ref 150–400)
RBC: 2.81 MIL/uL — ABNORMAL LOW (ref 4.22–5.81)
RDW: 16 % — ABNORMAL HIGH (ref 11.5–15.5)
WBC: 7.9 K/uL (ref 4.0–10.5)
nRBC: 2.1 % — ABNORMAL HIGH (ref 0.0–0.2)

## 2024-04-21 LAB — BASIC METABOLIC PANEL WITH GFR
Anion gap: 15 (ref 5–15)
BUN: 77 mg/dL — ABNORMAL HIGH (ref 6–20)
CO2: 18 mmol/L — ABNORMAL LOW (ref 22–32)
Calcium: 8.9 mg/dL (ref 8.9–10.3)
Chloride: 102 mmol/L (ref 98–111)
Creatinine, Ser: 4.25 mg/dL — ABNORMAL HIGH (ref 0.61–1.24)
GFR, Estimated: 16 mL/min — ABNORMAL LOW (ref >60)
Glucose, Bld: 142 mg/dL — ABNORMAL HIGH (ref 70–99)
Potassium: 3.8 mmol/L (ref 3.5–5.1)
Sodium: 135 mmol/L (ref 135–145)

## 2024-04-21 LAB — TYPE AND SCREEN
ABO/RH(D): O POS
Antibody Screen: NEGATIVE
Unit division: 0
Unit division: 0

## 2024-04-21 LAB — BPAM RBC
Blood Product Expiration Date: 202511012359
Blood Product Expiration Date: 202511172359
ISSUE DATE / TIME: 202510260206
ISSUE DATE / TIME: 202510281117
Unit Type and Rh: 202511172359
Unit Type and Rh: 5100
Unit Type and Rh: 9500

## 2024-04-21 MED ORDER — MIDODRINE HCL 5 MG PO TABS
15.0000 mg | ORAL_TABLET | Freq: Three times a day (TID) | ORAL | Status: DC
  Administered 2024-04-21 – 2024-05-03 (×35): 15 mg via ORAL
  Filled 2024-04-21 (×35): qty 3

## 2024-04-21 MED ORDER — SODIUM CHLORIDE 0.9 % IV BOLUS
250.0000 mL | Freq: Once | INTRAVENOUS | Status: AC
  Administered 2024-04-21: 250 mL via INTRAVENOUS

## 2024-04-21 NOTE — Progress Notes (Signed)
 Pt given 250 ml IV bolus x1 per Dr. Alfornia, recheck BP-94/68. Phlebotomy contacted x2 for stat CBC lab draw.

## 2024-04-21 NOTE — Plan of Care (Signed)
   Problem: Education: Goal: Knowledge of General Education information will improve Description Including pain rating scale, medication(s)/side effects and non-pharmacologic comfort measures Outcome: Progressing   Problem: Health Behavior/Discharge Planning: Goal: Ability to manage health-related needs will improve Outcome: Progressing

## 2024-04-21 NOTE — TOC Initial Note (Signed)
 Transition of Care [facility]) - Initial/Assessment Note    Patient Details  Name: Keith Leblanc MRN: [id number] Date of Birth: 06-Feb-1966  Transition [facility]) CM/SW Contact:    Arlana JINNY Nicholaus ISRAEL Phone Number: [phone] 04/21/2024, 3:07 PM  Clinical Narrative:     HF CSW met with patient at bedside. Patient stated that he lives alone. Patient stated that he is currently working, but does not need a work note at [employer]. Patient stated that he does not have a history of HH services. Patient stated that he does not use any equipment. Patient stated that he has a scale at home. Patient stated that he has a PCP. CSW explained that a hospital follow up is typically scheduled closer towards dc. Patient is agreeable and prefers morning appointments. Patient stated that he has support for transportation at dc.   HF CSW/CM will continue to follow and monitor for dc readiness.                Patient Goals and CMS Choice            Expected Discharge Plan and Services                                              Prior Living Arrangements/Services                       Activities of Daily Living   ADL Screening (condition at time of admission) Independently performs ADLs?: Yes (appropriate for developmental age) Is the patient deaf or have difficulty hearing?: No Does the patient have difficulty seeing, even when wearing glasses/contacts?: No Does the patient have difficulty concentrating, remembering, or making decisions?: No  Permission Sought/Granted                  Emotional Assessment              Admission diagnosis:  GI bleed [K92.2] Generalized weakness [R53.1] AKI (acute kidney injury) [N17.9] Dyspnea, unspecified type [R06.00] Acute on chronic blood loss anemia [D62] Patient Active Problem List   Diagnosis Date Noted   Acute on chronic systolic heart failure (HCC) 04/20/2024   Chronic kidney disease (CKD), stage IV (severe)  (HCC) 04/20/2024   Gastritis and gastroduodenitis 04/19/2024   ABLA (acute blood loss anemia) 04/18/2024   Hematochezia 04/18/2024   GI bleed 04/17/2024   Biventricular congestive heart failure (HCC) 03/13/2024   Acute on chronic diastolic CHF (congestive heart failure) (HCC) 03/12/2024   Alcohol use disorder 03/12/2024   Marijuana abuse 03/12/2024   Controlled diabetes mellitus (HCC) 03/12/2024   Acute kidney injury superimposed on chronic kidney disease 03/12/2024   Paroxysmal A-fib (HCC) 03/12/2024   Dyslipidemia 03/12/2024   Slurred speech 11/09/2022   Stroke (cerebrum) (HCC) 11/09/2022   Hypertriglyceridemia 05/04/2018   Class 1 drug-induced obesity with serious comorbidity and body mass index (BMI) of 32.0 to 32.9 in adult 01/21/2017   Type 2 diabetes mellitus with hyperglycemia (HCC) 08/27/2016   Protein-calorie malnutrition, severe 07/08/2016   Diabetic ketoacidosis without coma associated with type 2 diabetes mellitus (HCC) 07/07/2016   Acute renal failure (ARF) 07/07/2016   Hyponatremia 07/07/2016   Hyperkalemia 07/07/2016   Obesity (BMI 30-39.9) 07/07/2016   Tobacco abuse 07/07/2016   Overweight (BMI 25.0-29.9)    Melena 05/21/2016   Essential hypertension 04/28/2015   Erectile dysfunction  04/28/2015   Gout 04/28/2015   PCP:  [name], NP Pharmacy:   [facility] - [place], [place] - [phone] [address] [place] [address] Phone: [phone] Fax: [phone]  [id number] - [address] [place] - [address] AT [facility] OF [address] [place] [address] Phone: [phone] Fax: [phone]     Social Drivers of Health (SDOH) Social History: SDOH Screenings   Food Insecurity: No Food Insecurity (04/18/2024)  Housing: Low Risk  (04/18/2024)  Transportation Needs: No Transportation Needs (04/18/2024)  Utilities: Not At Risk (04/18/2024)  Depression (PHQ2-9): Low  Risk  (03/23/2024)  Tobacco Use: Medium Risk (04/19/2024)   SDOH Interventions:     Readmission Risk Interventions    03/15/2024   12:35 PM  Readmission Risk Prevention Plan  Transportation Screening Complete  HRI or Home Care Consult Complete  Social Work Consult for Recovery Care Planning/Counseling Complete  Palliative Care Screening Not Applicable  Medication Review Oceanographer) Referral to Pharmacy

## 2024-04-21 NOTE — Progress Notes (Addendum)
 Daily Progress Note  DOA: 04/17/2024 Hospital Day: 5  Cc: Melena, anemia  ASSESSMENT    58 year old male admitted GI bleed with melena and BRBPR on Eliquis  Gastritis with contact bleeding on EGD this admission. Biopsies showing reactive changes, no H. Pylori.  Scheduled for colonoscopy tomorrow  Acute blood loss anemia Baseline hgb 11.4-12.3. Presenting hgb 7.5 Blood transfusions: 04/18/2024 >>> 1 unit 04/20/2024 >>> 1 unit TODAY: Hemoglobin stable at 7.6 after transfusion yesterday  History of colon polyps Tubular adenomas and sessile serrated polyps in 2019.    AKI on CKD IV-V  Atrial fibrillation Takes Eliquis  at home  Chronic systolic heart failure, EF 43% Hypertrophic cardiomyopathy based on endomyocardial biopsy  Hypertension  Type 2 diabetes   PLAN   -Continue clear liquids today.  -Schedules for a colonoscopy tomorrow. The risks and benefits of colonoscopy with possible polypectomy / biopsies were discussed and the patient agrees to proceed.   Subjective   No physical complaints  Objective   GI Studies:   04/19/2024 EGD - Z-line irregular.  - Congested, friable (with contact bleeding) and nodular mucosa in the gastric body. Biopsied. Etiology of abnormal mucosa unclear, but H. pylori possible.  - Normal examined duodenum. - Biopsies were taken with a cold forceps for Helicobacter pylori testing.  - Suspect patient bled from diffuse friable gastric mucosa. No high risk bleeding lesions present.  A. STOMACH, ANTRUM, BIOPSY:  Gastric antral mucosa with mild changes suggestive of reactive  gastropathy.  Negative for Helicobacter pylori.   B. STOMACH, BODY, BIOPSY:  Gastric mucosa with hyperplastic changes and focal edema.  Negative for Helicobacter pylori    Recent Labs    04/19/24 0447 04/19/24 2314 04/20/24 0435 04/21/24 0319  WBC 7.3  --  7.9 7.9  HGB 7.4* 7.1* 7.0* 7.6*  HCT 23.8* 22.5* 21.9* 23.7*  MCV 86.9  --  84.9 84.3  PLT  404*  --  410* 396   No results for input(s): FOLATE, VITAMINB12, FERRITIN, TIBC, IRONPCTSAT in the last 72 hours. Recent Labs    04/19/24 0447 04/20/24 0435 04/21/24 0319  NA 138 138 135  K 4.3 3.7 3.8  CL 105 107 102  CO2 21* 19* 18*  GLUCOSE 150* 155* 142*  BUN 73* 76* 77*  CREATININE 3.97* 4.00* 4.25*  CALCIUM  8.8* 8.6* 8.9   Recent Labs    04/18/24 2343 04/19/24 0447  PROT 5.6* 5.3*  ALBUMIN 2.7* 2.4*  AST 71* 73*  ALT 91* 102*  ALKPHOS 104 92  BILITOT 1.2 1.0  BILIDIR 0.3*  --   IBILI 0.9  --      Scheduled inpatient medications:   sodium chloride    Intravenous Once   buPROPion   300 mg Oral Daily   midodrine  15 mg Oral TID WC   Na Sulfate-K Sulfate-Mg Sulfate concentrate  0.5 kit Oral Once   Followed by   Na Sulfate-K Sulfate-Mg Sulfate concentrate  0.5 kit Oral Once   pantoprazole   40 mg Oral BID   rosuvastatin   20 mg Oral Daily   simethicone   240 mg Oral Once   Followed by   simethicone   240 mg Oral Once   Continuous inpatient infusions:   sodium chloride  20 mL/hr at 04/20/24 1817   sodium chloride      PRN inpatient medications: acetaminophen  **OR** acetaminophen , ondansetron  **OR** ondansetron  (ZOFRAN ) IV  Vital signs in last 24 hours: Temp:  [97.4 F (36.3 C)-99.8 F (37.7 C)] 99.2 F (37.3 C) (10/29 0700) Pulse  Rate:  [51-68] 54 (10/29 0700) Resp:  [16-18] 18 (10/29 0453) BP: (84-97)/(53-69) 91/56 (10/29 0700) SpO2:  [94 %-100 %] 94 % (10/29 0700) Last BM Date : 04/20/24  Intake/Output Summary (Last 24 hours) at 04/21/2024 1158 Last data filed at 04/20/2024 2200 Gross per 24 hour  Intake 494 ml  Output --  Net 494 ml    Intake/Output from previous day: 10/28 0701 - 10/29 0700 In: 494 [P.O.:120; I.V.:20; Blood:354] Out: -  Intake/Output this shift: No intake/output data recorded.   Physical Exam:  General: Alert male in NAD Pulmonary: Normal respiratory effort Abdomen: Soft, nondistended, nontender. Normal bowel  sounds.  Neurologic: Alert and oriented Psych: Pleasant. Cooperative     LOS: 4 days   Vina Dasen ,NP 04/21/2024, 11:58 AM  ----------------------------------------------------------------------------------  I have taken a history, reviewed the chart and examined the patient. I performed a substantive portion of this encounter, including complete performance of at least one of the key components, in conjunction with the APP. I agree with the APP's note, impression and recommendations  Creatinine continued to rise slightly.  Hemoglobin up to 7.6 from 7.0 after 1 unit PRBC yesterday.  Patient reports multiple episodes of hematochezia.  Dyspnea remains about the same.  Advanced heart failure team evaluated patient, repeat echocardiogram pending. Plan for colonoscopy tomorrow to evaluate for possible lower GI source of acute blood loss anemia.  Daquan Crapps E. Stacia, MD [facility]  Low complex medical decision making (this includes chart review, review of results, face-to-face time used for counseling as well as treatment plan and follow-up. The patient was provided an opportunity to ask questions and all were answered. The patient agreed with the plan and demonstrated an understanding of the instructions

## 2024-04-21 NOTE — Consult Note (Addendum)
 Advanced Heart Failure Team Consult Note   Primary Physician: [name], NP Cardiologist:  Dr. Zenaida   Reason for Consultation: Assistance w/ Management of Systolic Heart Failure in Setting of AKI and Hypotension   HPI:    Keith Leblanc is seen today for management of systolic heart failure at the request of Dr. Barbaraann, Cardiology.   58 y/o AAM, former heavy drinker quit approx 6 wks ago, type 2DM, HTN, CKD IIIa, HLD, history of stroke, PAF on eliquis  and HFmrEF. Echo 8/25: EF 45-50%, severe LVH, abnormal RV free wall strain, ~17%, RV mildly reduced. cMRI was concerning for cardiac amyloidosis, however EMBx was done and pathology was negative for amyloid. Myocyte analysis most c/w hypertrophic or dilated cardiomyopathy. RHC at time of EMBx demonstrated moderately elevated biventricular filling pressures and severely reduced index by thermodilution at 1.83, though normal by FICK calculation at 2.89 L/min/m2 .   He has been on bisoprolol  + Jardiance  as outpatient. Additional GDMT limited by renal fx.   Pt now admitted for symptomatic ABLA. + melena and hematochezia. 6 point drop in Hgb down from baseline of 13.6>>6.8 on admission. SCr on admit was 3.13 (up from baseline of 2.5). Eliquis  held. Received numerous IVF boluses + total of 2uRBCs thus far. Hgb 6.8>>7.1>>7.0>>7.6 today. EGD yesterday showed diffusely congested, friable gastric mucosa involving the gastric body. Antrum appeared normal. Biopsies showed nonspecific edema and hyperplastic changes. No H. pylori noted. He is scheduled for colonoscopy tomorrow (previous study in 2019 showed Tubular adenomas and sessile serrated polyps).  After receiving volume resuscitation w/ both IVFs and blood products, he was felt to be volume overloaded and given dose of IV Lasix  (80 mg) yesterday but c/w worsening AKI, SCr 3.97>>4.00>>4.25, BUN 77, K 3.8. Also remains hypotensive, now requiring midodrine, currently on 15 mg tid. SBPs low 90s. C/w  positional dizziness and exertional dyspnea.   Pt does report urinary urgency w/ minimal UOP/decreased flow w/ voids. Denies dysuria. No pelvic or LBBB.    cMRI 9/25 IMPRESSION: 1. Mild decrease in left ventricular systolic function (LVEF =43%). Myocardial mass index of 90 g/m2, severely elevated with LGE pattern and parametric mapping consistent with cardiac amyloidosis.   2. Low normal right ventricular systolic function (RVEF =47%). Mild increase in thickness, consistent with cardiac amyloidosis. Dilation of the pulmonary artery with RV dilation by volumetric assessment.   RHC + EMBx 9/25 HEMODYNAMICS: RA:       13 mmHg (mean) RV:       50/4, 13 mmHg PA:       50/22 mmHg (32 mean) PCWP: 22 mmHg (mean) with v waves to                                      Estimated Fick CO/CI   7.06L/min, 2.89L/min/m2       Thermodilution CO/CI   4.48L/min, 1.83L/min/m2                                      TPG  10  mmHg                                               PVR  2.23 Debarah  Units  PAPi  2.1       IMPRESSION: Right heart catheterization and endomyocardial biopsy with 4 specimens obtained Moderately elevated biventricular filling pressures Severely reduced index by thermodilution Moderate group II PH with evidence of noncompliant left atrium Post procedure RA and echo similar to pre  EMBx 9/25 COMMENT:  The myocytes show cellular e hypertrophy with enlarged, hyperchromatic  and irregular nuclei.  These features are most typical of hypertrophic  or dilated cardiomyopathy.  [place] red stain is negative for amyloid.   CPX 10/25 (Preliminary Report) Exercise testing with gas exchange demonstrates a sever functional impairment when compared to matched sedentary norms.   Home Medications Prior to Admission medications   Medication Sig Start Date End Date Taking? Authorizing Provider  apixaban  (ELIQUIS ) 5 MG TABS tablet Take 1 tablet (5 mg total) by mouth 2 (two) times daily.  04/08/24  Yes Hayes Beckey CROME, NP  bisoprolol  (ZEBETA ) 10 MG tablet Take 2 tablets (20 mg total) by mouth daily. 04/08/24  Yes Hayes Beckey CROME, NP  buPROPion  (WELLBUTRIN  XL) 300 MG 24 hr tablet TAKE 1 TABLET BY MOUTH ONCE  DAILY 04/13/24  Yes Nafziger, Darleene, NP  colchicine  0.6 MG tablet TAKE 1 TABLET BY MOUTH DAILY  WHEN HAVING FLARES TAKE 1 TABLET BY MOUTH TWICE DAILY Patient taking differently: Take 0.6 mg by mouth as needed. TAKE 1 TABLET BY MOUTH  DAILY WHEN HAVING FLARES  TAKE 1 TABLET BY MOUTH  TWICE DAILY 09/05/23  Yes Nafziger, Darleene, NP  empagliflozin  (JARDIANCE ) 10 MG TABS tablet Take 1 tablet (10 mg total) by mouth daily. 04/08/24  Yes Hayes Beckey CROME, NP  furosemide  (LASIX ) 20 MG tablet Take 2 tablets (40 mg total) by mouth as needed (For increased swelling, weight gain of 3 LB in 1 day or 5 LB in 1 week). 04/08/24  Yes Hayes Beckey CROME, NP  hydrOXYzine (VISTARIL) 50 MG capsule Take 1 capsule (50 mg total) by mouth every 8 (eight) hours as needed. Patient taking differently: Take 50 mg by mouth every 8 (eight) hours as needed for itching. 03/23/24  Yes Nafziger, Darleene, NP  magnesium  oxide (MAG-OX) 400 (240 Mg) MG tablet TAKE 1 TABLET(400 MG) BY MOUTH DAILY 02/12/24  Yes West, Katlyn D, NP  Multiple Vitamins-Minerals (ONE-A-DAY MENS 50+) TABS Take 1 tablet by mouth daily with breakfast.   Yes [provider]  omeprazole  (PRILOSEC) 40 MG capsule TAKE 1 CAPSULE BY MOUTH DAILY Patient taking differently: Take 40 mg by mouth daily before breakfast. 11/12/23  Yes Nafziger, Darleene, NP  potassium chloride  SA (KLOR-CON  M) 20 MEQ tablet Take 2 tablets (40 mEq total) by mouth daily as needed (take with lasix  as needed dose). 04/08/24  Yes Hayes Beckey CROME, NP  rosuvastatin  (CRESTOR ) 20 MG tablet Take 1 tablet (20 mg total) by mouth daily. 04/08/24  Yes Hayes Beckey CROME, NP  thiamine  (VITAMIN B-1) 100 MG tablet Take 1 tablet (100 mg total) by mouth daily. 11/11/22  Yes Toberman, Stevi W, NP  triamcinolone cream (KENALOG)  0.5 % Apply 1 Application topically 2 (two) times daily. 03/23/24  Yes Nafziger, Darleene, NP  blood glucose meter kit and supplies Dispense based on patient and insurance preference. Use up to four times daily as directed. (FOR ICD-9 250.00, 250.01). 07/09/16   Sebastian Toribio GAILS, MD  glucose blood (ONETOUCH ULTRA TEST) test strip Use to check blood sugar 2-3 times a day. 03/01/24   Trixie File, MD  Insulin  Pen Needle (B-D UF III MINI PEN  NEEDLES) 31G X 5 MM MISC USE 4 TIMES DAILY AFTER MEALS AND AT BEDTIME 02/24/20   Trixie File, MD  Lancets [facility] ULTRASOFT) lancets Test blood sugars as directed. Dx E11.9 08/06/16   Nafziger, Cory, NP  nicotine  (NICODERM CQ  - DOSED IN MG/24 HR) 7 mg/24hr patch Place 1 patch (7 mg total) onto the skin daily. Patient not taking: Reported on 04/17/2024 03/19/24   Fairy Frames, MD  potassium chloride  (KLOR-CON ) 10 MEQ tablet TAKE 1 TABLET(10 MEQ) BY MOUTH DAILY Patient not taking: Reported on 04/17/2024 04/08/24   Hayes Beckey CROME, NP    Past Medical History: Past Medical History:  Diagnosis Date   Blood in stool    bright red blood    Chicken pox    Diabetes mellitus (HCC)    Erectile dysfunction    Gout    Hypertension    Stroke [facility])     Past Surgical History: Past Surgical History:  Procedure Laterality Date   APPENDECTOMY  2004   COLONOSCOPY     ENDOMYOCARDIAL BIOPSY N/A 03/16/2024   Procedure: ENDOMYOCARDIAL BIOPSY;  Surgeon: Zenaida Morene PARAS, MD;  Location: [facility] INVASIVE CV LAB;  Service: Cardiovascular;  Laterality: N/A;   RIGHT HEART CATH N/A 03/16/2024   Procedure: RIGHT HEART CATH;  Surgeon: Zenaida Morene PARAS, MD;  Location: [facility] INVASIVE CV LAB;  Service: Cardiovascular;  Laterality: N/A;   TOOTH EXTRACTION  07/06/2020    Family History: Family History  Problem Relation Age of Onset   Alcohol abuse Father    Hypertension Father    Heart disease Father    Gout Father    Breast cancer Mother    Hypertension Mother    Gout Mother     Healthy Sister    Healthy Daughter    Colon cancer Neg Hx    Esophageal cancer Neg Hx    Rectal cancer Neg Hx    Stomach cancer Neg Hx     Social History: Social History   Socioeconomic History   Marital status: Single    Spouse name: Not on file   Number of children: Not on file   Years of education: Not on file   Highest education level: Not on file  Occupational History   Not on file  Tobacco Use   Smoking status: Former    Current packs/day: 0.00    Types: Cigarettes, Cigars    Quit date: 03/10/2024    Years since quitting: 0.1   Smokeless tobacco: Never  Vaping Use   Vaping status: Never Used  Substance and Sexual Activity   Alcohol use: Not Currently   Drug use: Not Currently    Types: Marijuana   Sexual activity: Yes  Other Topics Concern   Not on file  Social History Narrative   Works for [employer]    Not married    One daughter who does not live with him    Likes to gamble, walk in the parks, travel.    Social Drivers of Corporate Investment Banker Strain: Not on file  Food Insecurity: No Food Insecurity (04/18/2024)   Hunger Vital Sign    Worried About Running Out of Food in the Last Year: Never true    Ran Out of Food in the Last Year: Never true  Transportation Needs: No Transportation Needs (04/18/2024)   PRAPARE - Administrator, Civil Service (Medical): No    Lack of Transportation (Non-Medical): No  Physical Activity: Not on file  Stress: Not on  file  Social Connections: Not on file    Allergies:  No Known Allergies  Objective:    Vital Signs:   Temp:  [97.4 F (36.3 C)-99.8 F (37.7 C)] 99.2 F (37.3 C) (10/29 0700) Pulse Rate:  [51-68] 54 (10/29 0700) Resp:  [16-20] 18 (10/29 0453) BP: (84-97)/(53-69) 91/56 (10/29 0700) SpO2:  [94 %-100 %] 94 % (10/29 0700) Last BM Date : 04/20/24  Weight change: Filed Weights   04/19/24 0910  Weight: 105.2 kg    Intake/Output:   Intake/Output Summary (Last 24 hours) at  04/21/2024 1118 Last data filed at 04/20/2024 2200 Gross per 24 hour  Intake 494 ml  Output --  Net 494 ml      Physical Exam    General:  sitting up in chair, fatigued appearing No resp difficulty Neck: JVD 8-9 cm Cor: PMI nondisplaced. Regular rate & rhythm. No rubs, gallops or murmurs. Lungs: clear Abdomen: soft, nontender, nondistended. No hepatosplenomegaly. Extremities: no cyanosis, clubbing, rash, trace b/l LEE  Neuro: alert & orientedx3, cranial nerves grossly intact. moves all 4 extremities w/o difficulty. Affect pleasant   Telemetry   N/A  EKG    Admit EKG accelerated junctional rhythm w/ PVCs, 68 bpm  Labs   Basic Metabolic Panel: Recent Labs  Lab 04/17/24 1704 04/19/24 0447 04/20/24 0435 04/21/24 0319  NA 137 138 138 135  K 4.4 4.3 3.7 3.8  CL 105 105 107 102  CO2 21* 21* 19* 18*  GLUCOSE 174* 150* 155* 142*  BUN 57* 73* 76* 77*  CREATININE 3.13* 3.97* 4.00* 4.25*  CALCIUM  9.1 8.8* 8.6* 8.9  MG  --  2.3  --   --   PHOS  --  6.2*  --   --     Liver Function Tests: Recent Labs  Lab 04/18/24 2343 04/19/24 0447  AST 71* 73*  ALT 91* 102*  ALKPHOS 104 92  BILITOT 1.2 1.0  PROT 5.6* 5.3*  ALBUMIN 2.7* 2.4*   No results for input(s): LIPASE, AMYLASE in the last 168 hours. No results for input(s): AMMONIA in the last 168 hours.  CBC: Recent Labs  Lab 04/18/24 0743 04/18/24 2343 04/19/24 0447 04/19/24 2314 04/20/24 0435 04/21/24 0319  WBC 9.2 10.6* 7.3  --  7.9 7.9  HGB 7.9* 8.1* 7.4* 7.1* 7.0* 7.6*  HCT 25.0* 26.1* 23.8* 22.5* 21.9* 23.7*  MCV 88.3 89.1 86.9  --  84.9 84.3  PLT 424* 471* 404*  --  410* 396    Cardiac Enzymes: No results for input(s): CKTOTAL, CKMB, CKMBINDEX, TROPONINI in the last 168 hours.  BNP: BNP (last 3 results) Recent Labs    03/24/24 1624 04/17/24 1704  BNP 1,093.8* 1,324.0*    ProBNP (last 3 results) Recent Labs    03/12/24 0942  PROBNP 5,364.0*     CBG: Recent Labs  Lab  04/17/24 1947  GLUCAP 124*    Coagulation Studies: No results for input(s): LABPROT, INR in the last 72 hours.   Imaging   No results found.   Medications:     Current Medications:  sodium chloride    Intravenous Once   buPROPion   300 mg Oral Daily   midodrine  15 mg Oral TID WC   Na Sulfate-K Sulfate-Mg Sulfate concentrate  0.5 kit Oral Once   Followed by   Na Sulfate-K Sulfate-Mg Sulfate concentrate  0.5 kit Oral Once   pantoprazole   40 mg Oral BID   rosuvastatin   20 mg Oral Daily   simethicone   240 mg Oral Once   Followed by   simethicone   240 mg Oral Once    Infusions:  sodium chloride  20 mL/hr at 04/20/24 1817   sodium chloride         Patient Profile   58 y/o male w/ prior h/o heavy ETOH use (recently quit), HFmrEF, CKDIIIa, PAF on Eliquis , HTN, HLD and Type 2DM, admitted w/ symptomatic ABLA 2/2 GIB. HF management c/b persistent hypotension and AKI despite volume resuscitation. AHF team asked to assist.   Assessment/Plan   1. Chronic HFmrEF  - Echo 8/25: EF 45-50%, severe LVH, abnormal RV free wall strain, ~17%, RV mildly reduced. cMRI was concerning for cardiac amyloidosis, however EMBx was done and pathology was negative for amyloid. Myocyte analysis most c/w hypertrophic or dilated cardiomyopathy. RHC at time of EMBx demonstrated moderately elevated biventricular filling pressures and severely reduced index by thermodilution at 1.83, though normal by FICK calculation at 2.89 L/min/m2 - NYHA lllb, confounded by symptomatic ABLA  - received volume resuscitation w/ IVFs + 2uRBC transfusion, appears volume replete on physical exam but c/w low BP necessitating high dose midodrine + worsening AKI, SCr now up to 4.25 (baseline 2.5). Lactic acid reassuring at 1.5  - doubt low output, but would be beneficial to repeat RHC to better assess filling pressures and guide further therapies  - GDMT limited by hypotension and AKI, continue midodrine 15 mg tid for now  -  place TED hoses   2. AKI on CKD IIIa - baseline Scr ~2.4 - Admit SCr 3.13>>3.97>>4.00>>4.25, BUN 77, K 3.8 - suspect multifactorial d/t hypotension + ABLA, though now appears volume replete on exam  - hold further doses of IV Lasix  for now (anticipate bowel prep for colonoscopy tomorrow) - continue midodrine to support BP - recommend bladder scan to r/o urinary retention based on reported urinary symptoms of urgency + low volume voids - consider checking UA to r/o UTI   3. ABLA/ GIB  - endorsed both melena and hematochezia on admit  - Hgb 6.8>>7.6 today  - EGD showed diffusely congested, friable gastric mucosa involving the gastric body. Antrum appeared normal. Biopsies showed nonspecific edema and hyperplastic changes. No H. pylori noted - suspect 2/2 heavy ETOH use. Reports he recently quit - PPI per GI team  - scheduled for colonoscopy tomorrow (h/o Tubular adenomas and sessile serrated polyps on prior study 2019)  - continue to hold Eliquis    4. PAF - Admit EKG accelerated junctional rhythm w/ PVCs, 68 bpm - not currently on tele - holding Eliquis  w/ acute GIB  - may need future evaluation for Watchman     Length of Stay: [address], PA-C  04/21/2024, 11:18 AM    Advanced Heart Failure Team Pager [phone] (M-F; 7a - 5p)  Please contact [facility] for night-coverage after hours (4p -7a ) and weekends on [email or web address]  Patient seen with PA, I formulated the plan and agree with the above note.   Patient was admitted with GI bleeding, resuscitated with multiple PRBCs and IVF.  He had an EGD showing gastritis, planned for colonoscopy tomorrow. SBP has been low, generally in 90s now and he has been started on midodrine 15 mg tid.  He is not on telemetry and is off Eliquis  (PAF) due to GI bleeding.  Baseline creatinine 2.5 is now up to 4.25.  Lactate normal at 1.5. Hgb today 7.6.   General: NAD Neck: JVP 12 cm with HJR, no thyromegaly or thyroid  nodule.  Lungs:  Clear to auscultation bilaterally with normal respiratory effort. CV: Nondisplaced PMI.  Heart regular S1/S2, no S3/S4, no murmur.  1+ ankle edema.  No carotid bruit.  Normal pedal pulses.  Abdomen: Soft, nontender, no hepatosplenomegaly, no distention.  Skin: Intact without lesions or rashes.  Neurologic: Alert and oriented x 3.  Psych: Normal affect. Extremities: No clubbing or cyanosis.  HEENT: Normal.   1. Acute on chronic HF with mid range EF: Echo in 8/25 showed EF 45-50%, severe LVH, abnormal RV free wall strain, ~17%, RV mildly reduced.  Cardiac MRI showed moderate concentric LVH with EF 43%, RV EF 47%, ECV 36%, diffuse patchy LGE throughout LV myocardium. PYP scan negative.  He had an endomyocardial biopsy that was negative for cardiac amyloidosis.  I think that he is most likely to have hypertrophic cardiomyopathy.  He was admitted with GI bleeding, given blood products and IVF and is now volume overloaded, but creatinine up to 4.25.  - With need for bowel prep for colonoscopy tomorrow and rise in creatinine, would not diurese today.  - Continue midodrine for now to maintain BP.  - I am going to repeat and echo to make sure no change in LV EF.   - Getting c-scope tomorrow morning, will tentatively plan RHC Friday morning if not improving.  2. AKI on CKD stage 3b: ?ATN in setting of hypotension from GI bleeding.  Creatinine continues to rise today.  He is now volume overloaded.   - Follow BMET 3. Atrial fibrillation: Paroxysmal.  He is not on telemetry.   - Eliquis  on hold with GI bleeding.  - Will put him on telemetry.  4. GI bleeding: Has had transfusions.  EGD with gastritis.  Still not certain of source.  - Colonoscopy tomorrow.  5. ETOH abuse.   Ezra Shuck 04/21/2024 4:27 PM

## 2024-04-21 NOTE — Progress Notes (Signed)
 Echocardiogram 2D Echocardiogram has been performed.  Keith Leblanc 04/21/2024, 4:45 PM

## 2024-04-21 NOTE — Progress Notes (Signed)
 Keith Leblanc,  The biopsies taken from your stomach were notable for mild reactive gastropathy and nonspecific inflammatory changes which is a common finding but there was no evidence of Helicobacter pylori infection. No other concerning findings or precancerous changes were noted on the biopsies.  Although friable (fragile) mucosa in the stomach can be a source of GI bleeding, we are going to proceed with a colonoscopy while you are still in the hospital to exclude the colon as a source of GI blood loss.

## 2024-04-21 NOTE — Progress Notes (Signed)
 Cardiology Progress Note  Patient ID: Keith Leblanc MRN: [id number] DOB: 01-Jul-1965 Date of Encounter: 04/21/2024 Primary Cardiologist: None  Subjective   Chief Complaint: Fatigue  HPI: Plans for colonoscopy tomorrow.  Serum creatinine going up.  ROS:  All other ROS reviewed and negative. Pertinent positives noted in the HPI.     Physical Exam   Vitals:   04/21/24 0453 04/21/24 0512 04/21/24 0652 04/21/24 0700  BP: (!) 87/63 (!) 92/54 95/61 (!) 91/56  Pulse: (!) 57  (!) 54 (!) 54  Resp: 18     Temp: 98 F (36.7 C)   99.2 F (37.3 C)  TempSrc: Oral   Oral  SpO2: 99%   94%  Weight:      Height:        Intake/Output Summary (Last 24 hours) at 04/21/2024 0935 Last data filed at 04/20/2024 2200 Gross per 24 hour  Intake 494 ml  Output --  Net 494 ml       04/19/2024    9:10 AM 03/24/2024    3:50 PM 03/23/2024   11:04 AM  Last 3 Weights  Weight (lbs) 232 lb 239 lb 3.2 oz 236 lb  Weight (kg) 105.235 kg 108.5 kg 107.049 kg    Body mass index is 27.51 kg/m.  General: Well nourished, well developed, in no acute distress Head: Atraumatic, normal size  Eyes: PEERLA, EOMI  Neck: Supple, JVD 10-12 cmH2O Endocrine: No thryomegaly Cardiac: Normal S1, S2; irregular rhythm Lungs: Clear to auscultation bilaterally, no wheezing, rhonchi or rales  Abd: Soft, nontender, no hepatomegaly  Ext: 1+ pitting edema Musculoskeletal: No deformities, BUE and BLE strength normal and equal Skin: Warm and dry, no rashes   Neuro: Alert and oriented to person, place, time, and situation, CNII-XII grossly intact, no focal deficits  Psych: Normal mood and affect   Cardiac Studies  CMR 03/16/2024 IMPRESSION: 1. Mild decrease in left ventricular systolic function (LVEF =43%). Myocardial mass index of 90 g/m2, severely elevated with LGE pattern and parametric mapping consistent with cardiac amyloidosis.   2. Low normal right ventricular systolic function (RVEF =47%). Mild increase in  thickness, consistent with cardiac amyloidosis. Dilation of the pulmonary artery with RV dilation by volumetric assessment. Results discussed with primary team.  Patient Profile  Keith Leblanc is a 58 y.o. male with systolic heart failure, hypertrophic cardiomyopathy on EMB, persistent A-fib, CKD 4 admitted on 04/18/2024 with anemia.  Assessment & Plan   # Acute blood loss anemia # Gastritis - On PPI.  Biopsies taken by GI.  Plan for colonoscopy tomorrow.  Continue to hold Eliquis  for now.  # Chronic systolic heart failure, EF 43% # Hypertrophic cardiomyopathy based on endomyocardial biopsy # Hypotension - BPs are soft.  Hold GDMT.  On midodrine 10 mg 3 times daily.  He was given Lasix  yesterday as he is a bit volume up and serum creatinine continues to rise.  We will hold Lasix  today.  I will have the advanced heart failure team see him.  Do not believe he is in cardiogenic shock but we will see if they can offer any assistance in management.  # AKI  # CKD IV - Hold further diuresis.  May need nephrology consult.  I am going to have the heart failure team see him.  # Persistent A-fib - Rate controlled on no medications.  Holding Eliquis .     For questions or updates, please contact [facility] Please consult [email or web address] for contact info under  Signed, Darryle DASEN. Barbaraann, MD, [facility]  [facility]  04/21/2024 9:35 AM

## 2024-04-21 NOTE — H&P (View-Only) (Signed)
 Daily Progress Note  DOA: 04/17/2024 Hospital Day: 5  Cc: Melena, anemia  ASSESSMENT    58 year old male admitted GI bleed with melena and BRBPR on Eliquis  Gastritis with contact bleeding on EGD this admission. Biopsies showing reactive changes, no H. Pylori.  Scheduled for colonoscopy tomorrow  Acute blood loss anemia Baseline hgb 11.4-12.3. Presenting hgb 7.5 Blood transfusions: 04/18/2024 >>> 1 unit 04/20/2024 >>> 1 unit TODAY: Hemoglobin stable at 7.6 after transfusion yesterday  History of colon polyps Tubular adenomas and sessile serrated polyps in 2019.    AKI on CKD IV-V  Atrial fibrillation Takes Eliquis  at home  Chronic systolic heart failure, EF 43% Hypertrophic cardiomyopathy based on endomyocardial biopsy  Hypertension  Type 2 diabetes   PLAN   -Continue clear liquids today.  -Schedules for a colonoscopy tomorrow. The risks and benefits of colonoscopy with possible polypectomy / biopsies were discussed and the patient agrees to proceed.   Subjective   No physical complaints  Objective   GI Studies:   04/19/2024 EGD - Z-line irregular.  - Congested, friable (with contact bleeding) and nodular mucosa in the gastric body. Biopsied. Etiology of abnormal mucosa unclear, but H. pylori possible.  - Normal examined duodenum. - Biopsies were taken with a cold forceps for Helicobacter pylori testing.  - Suspect patient bled from diffuse friable gastric mucosa. No high risk bleeding lesions present.  A. STOMACH, ANTRUM, BIOPSY:  Gastric antral mucosa with mild changes suggestive of reactive  gastropathy.  Negative for Helicobacter pylori.   B. STOMACH, BODY, BIOPSY:  Gastric mucosa with hyperplastic changes and focal edema.  Negative for Helicobacter pylori    Recent Labs    04/19/24 0447 04/19/24 2314 04/20/24 0435 04/21/24 0319  WBC 7.3  --  7.9 7.9  HGB 7.4* 7.1* 7.0* 7.6*  HCT 23.8* 22.5* 21.9* 23.7*  MCV 86.9  --  84.9 84.3  PLT  404*  --  410* 396   No results for input(s): FOLATE, VITAMINB12, FERRITIN, TIBC, IRONPCTSAT in the last 72 hours. Recent Labs    04/19/24 0447 04/20/24 0435 04/21/24 0319  NA 138 138 135  K 4.3 3.7 3.8  CL 105 107 102  CO2 21* 19* 18*  GLUCOSE 150* 155* 142*  BUN 73* 76* 77*  CREATININE 3.97* 4.00* 4.25*  CALCIUM  8.8* 8.6* 8.9   Recent Labs    04/18/24 2343 04/19/24 0447  PROT 5.6* 5.3*  ALBUMIN 2.7* 2.4*  AST 71* 73*  ALT 91* 102*  ALKPHOS 104 92  BILITOT 1.2 1.0  BILIDIR 0.3*  --   IBILI 0.9  --      Scheduled inpatient medications:   sodium chloride    Intravenous Once   buPROPion   300 mg Oral Daily   midodrine  15 mg Oral TID WC   Na Sulfate-K Sulfate-Mg Sulfate concentrate  0.5 kit Oral Once   Followed by   Na Sulfate-K Sulfate-Mg Sulfate concentrate  0.5 kit Oral Once   pantoprazole   40 mg Oral BID   rosuvastatin   20 mg Oral Daily   simethicone   240 mg Oral Once   Followed by   simethicone   240 mg Oral Once   Continuous inpatient infusions:   sodium chloride  20 mL/hr at 04/20/24 1817   sodium chloride      PRN inpatient medications: acetaminophen  **OR** acetaminophen , ondansetron  **OR** ondansetron  (ZOFRAN ) IV  Vital signs in last 24 hours: Temp:  [97.4 F (36.3 C)-99.8 F (37.7 C)] 99.2 F (37.3 C) (10/29 0700) Pulse  Rate:  [51-68] 54 (10/29 0700) Resp:  [16-18] 18 (10/29 0453) BP: (84-97)/(53-69) 91/56 (10/29 0700) SpO2:  [94 %-100 %] 94 % (10/29 0700) Last BM Date : 04/20/24  Intake/Output Summary (Last 24 hours) at 04/21/2024 1158 Last data filed at 04/20/2024 2200 Gross per 24 hour  Intake 494 ml  Output --  Net 494 ml    Intake/Output from previous day: 10/28 0701 - 10/29 0700 In: 494 [P.O.:120; I.V.:20; Blood:354] Out: -  Intake/Output this shift: No intake/output data recorded.   Physical Exam:  General: Alert male in NAD Pulmonary: Normal respiratory effort Abdomen: Soft, nondistended, nontender. Normal bowel  sounds.  Neurologic: Alert and oriented Psych: Pleasant. Cooperative     LOS: 4 days   Vina Dasen ,NP 04/21/2024, 11:58 AM  ----------------------------------------------------------------------------------  I have taken a history, reviewed the chart and examined the patient. I performed a substantive portion of this encounter, including complete performance of at least one of the key components, in conjunction with the APP. I agree with the APP's note, impression and recommendations  Creatinine continued to rise slightly.  Hemoglobin up to 7.6 from 7.0 after 1 unit PRBC yesterday.  Patient reports multiple episodes of hematochezia.  Dyspnea remains about the same.  Advanced heart failure team evaluated patient, repeat echocardiogram pending. Plan for colonoscopy tomorrow to evaluate for possible lower GI source of acute blood loss anemia.  Daquan Crapps E. Stacia, MD [facility]  Low complex medical decision making (this includes chart review, review of results, face-to-face time used for counseling as well as treatment plan and follow-up. The patient was provided an opportunity to ask questions and all were answered. The patient agreed with the plan and demonstrated an understanding of the instructions

## 2024-04-21 NOTE — Progress Notes (Signed)
   04/21/24 0109  Vitals  Temp 99.8 F (37.7 C)  Temp Source Oral  BP (!) 84/58  MAP (mmHg) 67  BP Location Left Arm  BP Method Manual  Patient Position (if appropriate) Lying  Pulse Rate 68  Pulse Rate Source Dinamap  Resp 16  MEWS COLOR  MEWS Score Color Green  Oxygen Therapy  SpO2 100 %  O2 Device Room Air  MEWS Score  MEWS Temp 0  MEWS Systolic 1  MEWS Pulse 0  MEWS RR 0  MEWS LOC 0  MEWS Score 1   Pt asymptomatic at this time. Dr. Alfornia paged, awaiting response.

## 2024-04-21 NOTE — Plan of Care (Signed)
 Inititated Bowel cleanse at 1700. Pt independent in room. On Clear liq Diet.   Problem: Education: Goal: Knowledge of General Education information will improve Description: Including pain rating scale, medication(s)/side effects and non-pharmacologic comfort measures Outcome: Progressing   Problem: Health Behavior/Discharge Planning: Goal: Ability to manage health-related needs will improve Outcome: Progressing   Problem: Clinical Measurements: Goal: Ability to maintain clinical measurements within normal limits will improve Outcome: Progressing Goal: Will remain free from infection Outcome: Progressing Goal: Diagnostic test results will improve Outcome: Progressing Goal: Respiratory complications will improve Outcome: Progressing Goal: Cardiovascular complication will be avoided Outcome: Progressing   Problem: Activity: Goal: Risk for activity intolerance will decrease Outcome: Progressing   Problem: Nutrition: Goal: Adequate nutrition will be maintained Outcome: Progressing   Problem: Coping: Goal: Level of anxiety will decrease Outcome: Progressing   Problem: Elimination: Goal: Will not experience complications related to bowel motility Outcome: Progressing Goal: Will not experience complications related to urinary retention Outcome: Progressing   Problem: Pain Managment: Goal: General experience of comfort will improve and/or be controlled Outcome: Progressing   Problem: Safety: Goal: Ability to remain free from injury will improve Outcome: Progressing   Problem: Skin Integrity: Goal: Risk for impaired skin integrity will decrease Outcome: Progressing

## 2024-04-21 NOTE — Progress Notes (Signed)
 PROGRESS NOTE Keith Leblanc  FMW:[id number] DOB: June 03, 1966 DOA: 04/17/2024 PCP: Keith Huxley, NP  Brief Narrative/Hospital Course: Keith Leblanc is a 58 y.o. male with PMH of of prior stroke, hypertension, diabetes who presented to the ED due to shortness of breath w/ progressive worsening over the last week and noticed bright red blood in stool , he in ED BP soft, labs showed bicarb 21, creatinine 3.1 baseline around 3, WBC 8.0, hemoglobin 7.5 baseline around 12, troponin 59, 54, INR 1.6.   CXR>>no acute changes.GI was consulted and started on IV PPI BID.  S/P 1 unit of packed RBCs for Hg 6.8. His Eliquis  was held. Last colonoscopy in 2019 which showed diverticulosis and multiple polyps.  Patient endorsed improving shortness of breath following transfusion no chest pain. Underwent EGD 10/27 has congested friable gastric mucosa likely the source of bleeding advised to continue PPI okay to resume Eliquis  10/28  Subjective: Seen and examined today Noted hypotension in 80s overnight needing 250 cc bolus Labs shows creatinine uptrending 4.2 BUN 77 received Lasix  yesterday with blood transfusion Hemoglobin up at 7.6 Still having red stool mixed with blood ( but not new) Denies any dizziness  Assessment and plan:  Acute blood loss anemia most likely secondary upper GI bleed with friable congested gastric mucosa, rule out lower GI bleed: history of LA grade a reflux esophagitis and gastritis seen on EGD done in 2019. S/p EGD 10/27 w showed -congested friable with contact bleeding and nodular mucosa in the gastric body- biopsy taken normal duodenum, suspect patient bleed from diffuse friable gastric mucosa > continue diet ADAT, as per GI okay resume Eliquis  but with this blood pressure being low and hemoglobin being low holding off for now. Cardiology follow-up for preop eval. GI planning for colonoscopy tomorrow. Supportive units PRBC hemoglobin in mid 7 g.  Continue PPI and monitor  hemoglobin and vitals Recent Labs    04/18/24 2343 04/19/24 0447 04/19/24 2314 04/20/24 0435 04/21/24 0319  HGB 8.1* 7.4* 7.1* 7.0* 7.6*  MCV 89.1 86.9  --  84.9 84.3   Chronic CHF with midrange EF 43% HCM Hypertension history Hypotension: Prior to admission on goal-directed medical therapy, cardiology is following closely s/p iv lasix  with 1 PRBC on 10/28. Continue midodrine-Will increase to 15mg  tis as patient remains hypotensive overnight needing 250 cc bolus IVF Continue to hold antihypertensives and GDMT -advanced heart failure team is being consulted per cardiology, holding Lasix . Cont to monitor daily I/O,weight, electrolytes and net balance as below.Keep on  salt/fluid restricted diet and monitor in tele.Net IO Since Admission: 2,137 mL [04/21/24 1021]  Filed Weights   04/19/24 0910  Weight: 105.2 kg    Recent Labs  Lab 04/17/24 1704 04/19/24 0447 04/20/24 0435 04/21/24 0319  BNP 1,324.0*  --   --   --   BUN 57* 73* 76* 77*  CREATININE 3.13* 3.97* 4.00* 4.25*  K 4.4 4.3 3.7 3.8  MG  --  2.3  --   --     Chronic depression: Mood stable, continue home bupropion    Elevated troponin: Due to demand ischemia.  Plan per cardiology  Hyperlipidemia: continue home rosuvastatin    Persistent A-fib: Eliquis  on hold due to ongoing anemia.   CKD stage V: Creatinine has been fluctuating CKD 5 versus AKI on CKD 4.  Creatinine uptrending suspect component of AKI with patient's hypotension vs cardiorenal syndrome.  Monitor urine output, On diuresis per cardio' Recent Labs    03/15/24 [phone] 03/16/24 9563 03/16/24 9193 03/17/24 9443  03/18/24 0357 03/23/24 1138 03/24/24 1624 04/17/24 1704 04/19/24 0447 04/20/24 0435 04/21/24 0319  BUN 21* 22*  --  20 24* 27* 31* 57* 73* 76* 77*  CREATININE 3.08* 2.79*  --  3.05* 2.88* 2.42* 2.47* 3.13* 3.97* 4.00* 4.25*  CO2 33* 32  --  32 28 30 25  21* 21* 19* 18*  K 3.3* 3.7   < > 4.1 4.7 4.4 4.3 4.4 4.3 3.7 3.8   < > = values in  this interval not displayed.   Intake/Output Summary (Last 24 hours) at 04/21/2024 1021 Last data filed at 04/20/2024 2200 Gross per 24 hour  Intake 494 ml  Output --  Net 494 ml    DVT prophylaxis: SCDs Start: 04/17/24 2333 Code Status:   Code Status: Full Code Family Communication: plan of care discussed with patient and his siter was updated at bedside 10/27. Patient status is: Remains hospitalized because of severity of illness Level of care: Med-Surg   Dispo: The patient is from: HOME alone.            Anticipated disposition: TBD Objective: Vitals last 24 hrs: Vitals:   04/21/24 0453 04/21/24 0512 04/21/24 0652 04/21/24 0700  BP: (!) 87/63 (!) 92/54 95/61 (!) 91/56  Pulse: (!) 57  (!) 54 (!) 54  Resp: 18     Temp: 98 F (36.7 C)   99.2 F (37.3 C)  TempSrc: Oral   Oral  SpO2: 99%   94%  Weight:      Height:        Physical Examination: General exam: alert awake, oriented HEENT:Oral mucosa moist, Ear/Nose WNL grossly Respiratory system: Bilaterally clear BS,no use of accessory muscle Cardiovascular system: S1 & S2 +, No JVD. Gastrointestinal system: Abdomen soft,NT,ND, BS+ Nervous System: Alert, awake, moving all extremities,and following commands. Extremities: extremities warm, leg edema NEG Skin: Warm, no rashes MSK: Normal muscle bulk,tone, power   Medications reviewed:  Scheduled Meds:  sodium chloride    Intravenous Once   buPROPion   300 mg Oral Daily   midodrine  15 mg Oral TID WC   Na Sulfate-K Sulfate-Mg Sulfate concentrate  0.5 kit Oral Once   Followed by   Na Sulfate-K Sulfate-Mg Sulfate concentrate  0.5 kit Oral Once   pantoprazole   40 mg Oral BID   rosuvastatin   20 mg Oral Daily   simethicone   240 mg Oral Once   Followed by   simethicone   240 mg Oral Once   Continuous Infusions:  sodium chloride  20 mL/hr at 04/20/24 1817   sodium chloride      Diet: Diet Order             Diet NPO time specified Except for: Sips with Meds, Ice Chips,  Other (See Comments)  Diet effective midnight           Diet clear liquid Fluid consistency: Thin  Diet effective now                    Data Reviewed: I have personally reviewed following labs and imaging studies ( see epic result tab) CBC: Recent Labs  Lab 04/18/24 0743 04/18/24 2343 04/19/24 0447 04/19/24 2314 04/20/24 0435 04/21/24 0319  WBC 9.2 10.6* 7.3  --  7.9 7.9  HGB 7.9* 8.1* 7.4* 7.1* 7.0* 7.6*  HCT 25.0* 26.1* 23.8* 22.5* 21.9* 23.7*  MCV 88.3 89.1 86.9  --  84.9 84.3  PLT 424* 471* 404*  --  410* 396   CMP: Recent Labs  Lab  04/17/24 1704 04/19/24 0447 04/20/24 0435 04/21/24 0319  NA 137 138 138 135  K 4.4 4.3 3.7 3.8  CL 105 105 107 102  CO2 21* 21* 19* 18*  GLUCOSE 174* 150* 155* 142*  BUN 57* 73* 76* 77*  CREATININE 3.13* 3.97* 4.00* 4.25*  CALCIUM  9.1 8.8* 8.6* 8.9  MG  --  2.3  --   --   PHOS  --  6.2*  --   --    GFR: Estimated Creatinine Clearance: 24.5 mL/min (A) (by C-G formula based on SCr of 4.25 mg/dL (H)). Recent Labs  Lab 04/18/24 2343 04/19/24 0447  AST 71* 73*  ALT 91* 102*  ALKPHOS 104 92  BILITOT 1.2 1.0  PROT 5.6* 5.3*  ALBUMIN 2.7* 2.4*   No results for input(s): LIPASE, AMYLASE in the last 168 hours. No results for input(s): AMMONIA in the last 168 hours. Coagulation Profile:  Recent Labs  Lab 04/17/24 2253  INR 1.6*   Unresulted Labs (From admission, onward)     Start     Ordered   04/21/24 1009  Lactic acid, plasma  (Lactic Acid)  ONCE - STAT,   STAT       Question:  Specimen collection method  Answer:  Lab=Lab collect   04/21/24 1008           Antimicrobials/Microbiology: Anti-infectives (From admission, onward)    None         Component Value Date/Time   SDES BLOOD LEFT ANTECUBITAL 07/07/2016 1225   SPECREQUEST BOTTLES DRAWN AEROBIC AND ANAEROBIC 5CC 07/07/2016 1225   CULT  07/07/2016 1225    NO GROWTH 5 DAYS Performed at [facility], [address], [place], [place] [address]     REPTSTATUS 07/12/2016 FINAL 07/07/2016 1225    Procedures: Procedure(s) (LRB): EGD (ESOPHAGOGASTRODUODENOSCOPY) (N/A)   Mennie LAMY, MD [facility] 04/21/2024, 10:21 AM

## 2024-04-22 ENCOUNTER — Inpatient Hospital Stay (HOSPITAL_COMMUNITY): Payer: Self-pay | Admitting: Certified Registered Nurse Anesthetist

## 2024-04-22 ENCOUNTER — Encounter (HOSPITAL_COMMUNITY): Payer: Self-pay | Admitting: Internal Medicine

## 2024-04-22 ENCOUNTER — Inpatient Hospital Stay (HOSPITAL_COMMUNITY)

## 2024-04-22 ENCOUNTER — Encounter (HOSPITAL_COMMUNITY)
Admission: EM | Disposition: A | Discharge status: Home / Self Care | Payer: Self-pay | Source: Home / Self Care | Attending: Internal Medicine

## 2024-04-22 DIAGNOSIS — K640 First degree hemorrhoids: Secondary | ICD-10-CM | POA: Diagnosis not present

## 2024-04-22 DIAGNOSIS — I132 Hypertensive heart and chronic kidney disease with heart failure and with stage 5 chronic kidney disease, or end stage renal disease: Secondary | ICD-10-CM | POA: Diagnosis not present

## 2024-04-22 DIAGNOSIS — D123 Benign neoplasm of transverse colon: Secondary | ICD-10-CM | POA: Diagnosis not present

## 2024-04-22 DIAGNOSIS — D62 Acute posthemorrhagic anemia: Secondary | ICD-10-CM | POA: Diagnosis not present

## 2024-04-22 DIAGNOSIS — K2971 Gastritis, unspecified, with bleeding: Secondary | ICD-10-CM | POA: Diagnosis not present

## 2024-04-22 DIAGNOSIS — K922 Gastrointestinal hemorrhage, unspecified: Secondary | ICD-10-CM | POA: Diagnosis not present

## 2024-04-22 DIAGNOSIS — I5043 Acute on chronic combined systolic (congestive) and diastolic (congestive) heart failure: Secondary | ICD-10-CM | POA: Diagnosis not present

## 2024-04-22 DIAGNOSIS — Z515 Encounter for palliative care: Secondary | ICD-10-CM | POA: Diagnosis not present

## 2024-04-22 DIAGNOSIS — N179 Acute kidney failure, unspecified: Secondary | ICD-10-CM | POA: Diagnosis not present

## 2024-04-22 HISTORY — PX: COLONOSCOPY: SHX5424

## 2024-04-22 LAB — PROTEIN / CREATININE RATIO, URINE
Creatinine, Urine: 44 mg/dL
Protein Creatinine Ratio: 1.41 mg/mg{creat} — ABNORMAL HIGH (ref 0.00–0.15)
Total Protein, Urine: 62 mg/dL

## 2024-04-22 LAB — URINALYSIS, ROUTINE W REFLEX MICROSCOPIC
Bilirubin Urine: NEGATIVE
Glucose, UA: NEGATIVE mg/dL
Ketones, ur: NEGATIVE mg/dL
Leukocytes,Ua: NEGATIVE
Nitrite: NEGATIVE
Protein, ur: 30 mg/dL — AB
Specific Gravity, Urine: 1.008 (ref 1.005–1.030)
pH: 5 (ref 5.0–8.0)

## 2024-04-22 LAB — CBC
HCT: 26 % — ABNORMAL LOW (ref 39.0–52.0)
Hemoglobin: 8.3 g/dL — ABNORMAL LOW (ref 13.0–17.0)
MCH: 26.9 pg (ref 26.0–34.0)
MCHC: 31.9 g/dL (ref 30.0–36.0)
MCV: 84.1 fL (ref 80.0–100.0)
Platelets: 453 K/uL — ABNORMAL HIGH (ref 150–400)
RBC: 3.09 MIL/uL — ABNORMAL LOW (ref 4.22–5.81)
RDW: 15.9 % — ABNORMAL HIGH (ref 11.5–15.5)
WBC: 7.5 K/uL (ref 4.0–10.5)
nRBC: 1.7 % — ABNORMAL HIGH (ref 0.0–0.2)

## 2024-04-22 LAB — BASIC METABOLIC PANEL WITH GFR
Anion gap: 17 — ABNORMAL HIGH (ref 5–15)
BUN: 71 mg/dL — ABNORMAL HIGH (ref 6–20)
CO2: 20 mmol/L — ABNORMAL LOW (ref 22–32)
Calcium: 8.8 mg/dL — ABNORMAL LOW (ref 8.9–10.3)
Chloride: 102 mmol/L (ref 98–111)
Creatinine, Ser: 4.49 mg/dL — ABNORMAL HIGH (ref 0.61–1.24)
GFR, Estimated: 15 mL/min — ABNORMAL LOW (ref >60)
Glucose, Bld: 157 mg/dL — ABNORMAL HIGH (ref 70–99)
Potassium: 4 mmol/L (ref 3.5–5.1)
Sodium: 139 mmol/L (ref 135–145)

## 2024-04-22 LAB — GLUCOSE, CAPILLARY
Glucose-Capillary: 126 mg/dL — ABNORMAL HIGH (ref 70–99)
Glucose-Capillary: 151 mg/dL — ABNORMAL HIGH (ref 70–99)
Glucose-Capillary: 160 mg/dL — ABNORMAL HIGH (ref 70–99)
Glucose-Capillary: 151 mg/dL — ABNORMAL HIGH (ref 70–99)

## 2024-04-22 SURGERY — COLONOSCOPY
Anesthesia: Monitor Anesthesia Care

## 2024-04-22 MED ORDER — SODIUM CHLORIDE 0.9 % IV SOLN: INTRAVENOUS | Status: DC | PRN

## 2024-04-22 MED ORDER — ALBUMIN HUMAN 25 % IV SOLN
25.0000 g | Freq: Four times a day (QID) | INTRAVENOUS | Status: AC
  Administered 2024-04-22 – 2024-04-23 (×3): 25 g via INTRAVENOUS
  Filled 2024-04-22 (×2): qty 100

## 2024-04-22 MED ORDER — INSULIN ASPART 100 UNIT/ML IJ SOLN
0.0000 [IU] | Freq: Three times a day (TID) | INTRAMUSCULAR | Status: DC
  Administered 2024-04-22 – 2024-04-24 (×3): 2 [IU] via SUBCUTANEOUS
  Administered 2024-04-24 – 2024-04-29 (×9): 1 [IU] via SUBCUTANEOUS
  Administered 2024-04-30: 2 [IU] via SUBCUTANEOUS
  Administered 2024-05-03: 1 [IU] via SUBCUTANEOUS
  Filled 2024-04-22 (×2): qty 1
  Filled 2024-04-22: qty 2
  Filled 2024-04-22 (×2): qty 1
  Filled 2024-04-22 (×2): qty 2
  Filled 2024-04-22: qty 1
  Filled 2024-04-22: qty 2
  Filled 2024-04-22 (×3): qty 1

## 2024-04-22 MED ORDER — FUROSEMIDE 10 MG/ML IJ SOLN
120.0000 mg | Freq: Two times a day (BID) | INTRAVENOUS | Status: DC
  Administered 2024-04-22: 120 mg via INTRAVENOUS
  Filled 2024-04-22: qty 2
  Filled 2024-04-22: qty 12

## 2024-04-22 MED ORDER — PROPOFOL 10 MG/ML IV BOLUS
INTRAVENOUS | Status: DC | PRN
  Administered 2024-04-22: 30 mg via INTRAVENOUS

## 2024-04-22 MED ORDER — LIDOCAINE 2% (20 MG/ML) 5 ML SYRINGE
INTRAMUSCULAR | Status: DC | PRN
  Administered 2024-04-22: 40 mg via INTRAVENOUS

## 2024-04-22 MED ORDER — PROPOFOL 500 MG/50ML IV EMUL
INTRAVENOUS | Status: DC | PRN
Start: 2024-04-22 — End: 2024-04-22
  Administered 2024-04-22: 100 ug/kg/min via INTRAVENOUS

## 2024-04-22 MED ORDER — POTASSIUM CHLORIDE CRYS ER 20 MEQ PO TBCR
20.0000 meq | EXTENDED_RELEASE_TABLET | Freq: Once | ORAL | Status: AC
  Administered 2024-04-22: 20 meq via ORAL
  Filled 2024-04-22: qty 1

## 2024-04-22 NOTE — Anesthesia Postprocedure Evaluation (Signed)
 Anesthesia Post Note  Patient: Keith Leblanc  Procedure(s) Performed: COLONOSCOPY     Patient location during evaluation: PACU Anesthesia Type: MAC Level of consciousness: awake and alert Pain management: pain level controlled Vital Signs Assessment: post-procedure vital signs reviewed and stable Respiratory status: spontaneous breathing, nonlabored ventilation, respiratory function stable and patient connected to nasal cannula oxygen Cardiovascular status: stable and blood pressure returned to baseline Postop Assessment: no apparent nausea or vomiting Anesthetic complications: no   No notable events documented.  Last Vitals:  Vitals:   04/22/24 0900 04/22/24 0910  BP: 92/61 91/64  Pulse: 62 60  Resp: (!) 23 (!) 25  Temp:    SpO2: 100% 97%    Last Pain:  Vitals:   04/22/24 0910  TempSrc:   PainSc: 0-No pain                 Franky JONETTA Bald

## 2024-04-22 NOTE — Progress Notes (Signed)
 PROGRESS NOTE Keith Leblanc  FMW:[id number] DOB: 06-Aug-1965 DOA: 04/17/2024 PCP: [name], NP  Brief Narrative/Hospital Course: Keith Leblanc is a 58 y.o. male with PMH of of prior stroke, hypertension, diabetes who presented to the ED due to shortness of breath w/ progressive worsening over the last week and noticed bright red blood in stool , he in ED BP soft, labs showed bicarb 21, creatinine 3.1 baseline around 3, WBC 8.0, hemoglobin 7.5 baseline around 12, troponin 59, 54, INR 1.6.   CXR>>no acute changes.GI was consulted and started on IV PPI BID.  S/P 1 unit of packed RBCs for Hg 6.8. His Eliquis  was held. Last colonoscopy in 2019 which showed diverticulosis and multiple polyps.  Patient endorsed improving shortness of breath following transfusion no chest pain. Underwent EGD 10/27 has congested friable gastric mucosa likely the source of bleeding  10/30 colonoscopy showed hemorrhoids likely cause of patient's blood in the stool  Subjective: Seen and examined Overnight afebrile BP has been 90s-100, on room air Labs this morning creatinine further up 4.4 hemoglobin up 7.6> 8.3g, platelet 453 Underwent colonoscopy this morning  He feels hungry waiting for male wife At the bedside No new complaints  Assessment and plan:  Acute blood loss anemia most likely secondary upper GI bleed with friable congested gastric mucosa, rule out lower GI bleed: history of LA grade a reflux esophagitis and gastritis seen on EGD done in 2019. S/p EGD 10/27 w showed -congested friable with contact bleeding and nodular mucosa in the gastric body- biopsy taken normal duodenum, suspect patient bleed from diffuse friable gastric mucosa > continue diet ADAT, as per GI okay resume Eliquis  but with this blood pressure being low and hemoglobin being low holding off for now. Cardiology follow-up for preop eval. GI following S/p Colonoscopy this morning: 3 to 5 mm polyps in transverse colon removed, ileum  was normal nonbleeding internal hemorrhoids noticed was discussed with patient's hematochezia but doubt it accounts for patient's drop in hemoglobin-likely contributed by friable gastric mucosa. Ok to resume Eliquis  in 2 days Continue PPI and monitor hemoglobin and is trending up Recent Labs    04/19/24 0447 04/19/24 2314 04/20/24 0435 04/21/24 0319 04/22/24 0404  HGB 7.4* 7.1* 7.0* 7.6* 8.3*  MCV 86.9  --  84.9 84.3 84.1   Acute on chronic CHF with midrange EF 43% HCM Hypertension history Hypotension: Prior to admission on goal-directed medical therapy, cardiology is following closely s/p iv lasix  with 1 PRBC on 10/28. Continue midodrine 15mg  tid due to hypotension Continue to hold antihypertensives and GDMT-advanced heart failure team following repeat TTE ordered but showed EF 40-45% global SMK severe left ventricular hypertrophy G3 DD. holding Lasix , GDMT Noted plan for RHC Friday. Cont to monitor daily I/O,weight, electrolytes and net balance as below.Keep on  salt/fluid restricted diet and monitor in tele.Net IO Since Admission: 3,606.67 mL [04/22/24 1151]  Filed Weights   04/19/24 0910  Weight: 105.2 kg    Recent Labs  Lab 04/17/24 1704 04/19/24 0447 04/20/24 0435 04/21/24 0319 04/22/24 0404  BNP 1,324.0*  --   --   --   --   BUN 57* 73* 76* 77* 71*  CREATININE 3.13* 3.97* 4.00* 4.25* 4.49*  K 4.4 4.3 3.7 3.8 4.0  MG  --  2.3  --   --   --     Chronic depression: Mood stable, continue home bupropion    Elevated troponin: Due to demand ischemia.  Plan per cardiology  Hyperlipidemia: continue home rosuvastatin   Persistent A-fib: Eliquis  on hold due to ongoing anemia.   AKI on CKD stage V: Creatinine has been fluctuating CKD 5 versus AKI on CKD 4.  Suspect component of ATN/AKI in the setting of GI bleeding, CHF creatinine uptrending suspect component of AKI with patient's hypotension vs cardiorenal syndrome. Nephrology consulted today.  Monitor urine output, On  diuresis per cardio' Recent Labs    03/16/24 0436 03/16/24 0806 03/17/24 0556 03/18/24 0357 03/23/24 1138 03/24/24 1624 04/17/24 1704 04/19/24 0447 04/20/24 0435 04/21/24 0319 04/22/24 0404  BUN 22*  --  20 24* 27* 31* 57* 73* 76* 77* 71*  CREATININE 2.79*  --  3.05* 2.88* 2.42* 2.47* 3.13* 3.97* 4.00* 4.25* 4.49*  CO2 32  --  32 28 30 25  21* 21* 19* 18* 20*  K 3.7   < > 4.1 4.7 4.4 4.3 4.4 4.3 3.7 3.8 4.0   < > = values in this interval not displayed.   Intake/Output Summary (Last 24 hours) at 04/22/2024 1151 Last data filed at 04/21/2024 1806 Gross per 24 hour  Intake 1469.67 ml  Output --  Net 1469.67 ml    DVT prophylaxis: Place TED hose Start: 04/21/24 1237 SCDs Start: 04/17/24 2333 Code Status:   Code Status: Full Code Family Communication: plan of care discussed with patient and his siter was updated at bedside again Patient status is: Remains hospitalized because of severity of illness Level of care: Med-Surg transfer to telemetry  Dispo: The patient is from: HOME alone.            Anticipated disposition: TBD Objective: Vitals last 24 hrs: Vitals:   04/22/24 0855 04/22/24 0900 04/22/24 0910 04/22/24 1124  BP: 107/63 92/61 91/64  91/66  Pulse: 64 62 60 60  Resp: 20 (!) 23 (!) 25   Temp: (!) 97.1 F (36.2 C)   97.9 F (36.6 C)  TempSrc: Temporal   Oral  SpO2: 100% 100% 97% 100%  Weight:      Height:        Physical Examination: General exam: alert awake, oriented HEENT:Oral mucosa moist, Ear/Nose WNL grossly Respiratory system: Bilaterally clear BS,no use of accessory muscle Cardiovascular system: S1 & S2 +, No JVD. Gastrointestinal system: Abdomen soft,NT,ND, BS+ Nervous System: Alert, awake, moving all extremities,and following commands. Extremities: extremities warm, leg edema NEG Skin: Warm, no rashes MSK: Normal muscle bulk,tone, power   Medications reviewed:  Scheduled Meds:  sodium chloride    Intravenous Once   buPROPion   300 mg Oral  Daily   insulin  aspart  0-9 Units Subcutaneous TID WC   midodrine  15 mg Oral TID WC   pantoprazole   40 mg Oral BID   potassium chloride   20 mEq Oral Once   rosuvastatin   20 mg Oral Daily   simethicone   240 mg Oral Once   Continuous Infusions:  furosemide       Diet: Diet Order             Diet Heart Room service appropriate? Yes; Fluid consistency: Thin  Diet effective now                    Data Reviewed: I have personally reviewed following labs and imaging studies ( see epic result tab) CBC: Recent Labs  Lab 04/18/24 2343 04/19/24 0447 04/19/24 2314 04/20/24 0435 04/21/24 0319 04/22/24 0404  WBC 10.6* 7.3  --  7.9 7.9 7.5  HGB 8.1* 7.4* 7.1* 7.0* 7.6* 8.3*  HCT 26.1* 23.8* 22.5* 21.9* 23.7* 26.0*  MCV  89.1 86.9  --  84.9 84.3 84.1  PLT 471* 404*  --  410* 396 453*   CMP: Recent Labs  Lab 04/17/24 1704 04/19/24 0447 04/20/24 0435 04/21/24 0319 04/22/24 0404  NA 137 138 138 135 139  K 4.4 4.3 3.7 3.8 4.0  CL 105 105 107 102 102  CO2 21* 21* 19* 18* 20*  GLUCOSE 174* 150* 155* 142* 157*  BUN 57* 73* 76* 77* 71*  CREATININE 3.13* 3.97* 4.00* 4.25* 4.49*  CALCIUM  9.1 8.8* 8.6* 8.9 8.8*  MG  --  2.3  --   --   --   PHOS  --  6.2*  --   --   --    GFR: Estimated Creatinine Clearance: 23.2 mL/min (A) (by C-G formula based on SCr of 4.49 mg/dL (H)). Recent Labs  Lab 04/18/24 2343 04/19/24 0447  AST 71* 73*  ALT 91* 102*  ALKPHOS 104 92  BILITOT 1.2 1.0  PROT 5.6* 5.3*  ALBUMIN 2.7* 2.4*   No results for input(s): LIPASE, AMYLASE in the last 168 hours. No results for input(s): AMMONIA in the last 168 hours. Coagulation Profile:  Recent Labs  Lab 04/17/24 2253  INR 1.6*   Unresulted Labs (From admission, onward)     Start     Ordered   04/22/24 0500  Basic metabolic panel with GFR  Daily,   R     Question:  Specimen collection method  Answer:  Lab=Lab collect   04/21/24 1022   04/22/24 0500  CBC  Daily,   R     Question:  Specimen  collection method  Answer:  Lab=Lab collect   04/21/24 1022           Antimicrobials/Microbiology: Anti-infectives (From admission, onward)    None         Component Value Date/Time   SDES BLOOD LEFT ANTECUBITAL 07/07/2016 1225   SPECREQUEST BOTTLES DRAWN AEROBIC AND ANAEROBIC 5CC 07/07/2016 1225   CULT  07/07/2016 1225    NO GROWTH 5 DAYS Performed at [facility], [address], [place], [place] [address]    REPTSTATUS 07/12/2016 FINAL 07/07/2016 1225    Procedures: Procedure(s) (LRB): COLONOSCOPY (N/A)   Mennie LAMY, MD [facility] 04/22/2024, 11:51 AM

## 2024-04-22 NOTE — Interval H&P Note (Signed)
 History and Physical Interval Note:  04/22/2024 8:14 AM  Keith Leblanc  has presented today for surgery, with the diagnosis of Anemia, gastrointestinal bleeding, history of colon polyps.  The various methods of treatment have been discussed with the patient and family. After consideration of risks, benefits and other options for treatment, the patient has consented to  Procedure(s): COLONOSCOPY (N/A) as a surgical intervention.  The patient's history has been reviewed, patient examined, no change in status, stable for surgery.  I have reviewed the patient's chart and labs.  Questions were answered to the patient's satisfaction.     Keith Leblanc

## 2024-04-22 NOTE — H&P (View-Only) (Signed)
 Advanced Heart Failure Rounding Note  Cardiologist: None  Chief Complaint: A/c HFmrEF, GIB   Patient Profile   58 y/o male w/ prior h/o heavy ETOH use (recently quit), HFmrEF, CKDIIIa, PAF on Eliquis , HTN, HLD and Type 2DM, admitted w/ symptomatic ABLA 2/2 GIB. HF management c/b persistent hypotension and AKI despite adequate volume resuscitation. AHF team asked to assist.   Subjective:    Echo yesterday EF 40-45% (previously 45-50%), severe LVH, RV mod reduced, RVSP 42, dilated IVC estimated RAP ~15 mmHg   Diuretics held yesterday for bowel prep. SCr 4.00>>4.25>>4.49 today, K 4.0   SBPs still low 90s on midodrine  Hgb 7.6>>8.3   Just returned from colonoscopy, 2 polys resected, non bleeding hemorrhoids.   Objective:   Weight Range: 105.2 kg Body mass index is 27.51 kg/m.   Vital Signs:   Temp:  [97.1 F (36.2 C)-98.5 F (36.9 C)] 97.1 F (36.2 C) (10/30 0855) Pulse Rate:  [55-67] 60 (10/30 0910) Resp:  [16-25] 25 (10/30 0910) BP: (86-107)/(58-76) 91/64 (10/30 0910) SpO2:  [96 %-100 %] 97 % (10/30 0910) Last BM Date : 04/21/24  Weight change: Filed Weights   04/19/24 0910  Weight: 105.2 kg    Intake/Output:   Intake/Output Summary (Last 24 hours) at 04/22/2024 0951 Last data filed at 04/21/2024 1806 Gross per 24 hour  Intake 1469.67 ml  Output --  Net 1469.67 ml      Physical Exam    General:  Well appearing. No resp difficulty HEENT: Normal Neck: JVD 14 cm  Rnm:Pmmzhlojmob regular rate & rhythm. No rubs, gallops or murmurs. Lungs: diminished at the bases bilaterally  Abdomen: Soft, nontender, nondistended. No hepatosplenomegaly. No bruits or masses. Good bowel sounds. Extremities: No cyanosis, clubbing, rash, 1+ b/l LE edema Neuro: Alert & orientedx3, cranial nerves grossly intact. moves all 4 extremities w/o difficulty. Affect pleasant   Telemetry    Junctional rhythm 60s   EKG    N/A   Labs    CBC Recent Labs    04/21/24 0319  04/22/24 0404  WBC 7.9 7.5  HGB 7.6* 8.3*  HCT 23.7* 26.0*  MCV 84.3 84.1  PLT 396 453*   Basic Metabolic Panel Recent Labs    89/70/74 0319 04/22/24 0404  NA 135 139  K 3.8 4.0  CL 102 102  CO2 18* 20*  GLUCOSE 142* 157*  BUN 77* 71*  CREATININE 4.25* 4.49*  CALCIUM  8.9 8.8*   Liver Function Tests No results for input(s): AST, ALT, ALKPHOS, BILITOT, PROT, ALBUMIN in the last 72 hours. No results for input(s): LIPASE, AMYLASE in the last 72 hours. Cardiac Enzymes No results for input(s): CKTOTAL, CKMB, CKMBINDEX, TROPONINI in the last 72 hours.  BNP: BNP (last 3 results) Recent Labs    03/24/24 1624 04/17/24 1704  BNP 1,093.8* 1,324.0*    ProBNP (last 3 results) Recent Labs    03/12/24 0942  PROBNP 5,364.0*     D-Dimer No results for input(s): DDIMER in the last 72 hours. Hemoglobin A1C No results for input(s): HGBA1C in the last 72 hours. Fasting Lipid Panel No results for input(s): CHOL, HDL, LDLCALC, TRIG, CHOLHDL, LDLDIRECT in the last 72 hours. Thyroid  Function Tests No results for input(s): TSH, T4TOTAL, T3FREE, THYROIDAB in the last 72 hours.  Invalid input(s): FREET3  Other results:   Imaging    ECHOCARDIOGRAM COMPLETE Result Date: 04/21/2024    ECHOCARDIOGRAM REPORT   Patient Name:   Keith Leblanc Date of Exam: 04/21/2024 Medical Rec #:  [id number]       Height:       77.0 in Accession #:    [id number]      Weight:       232.0 lb Date of Birth:  23-Jun-1966       BSA:          2.382 m Patient Age:    58 years        BP:           87/70 mmHg Patient Gender: M               HR:           50 bpm. Exam Location:  Inpatient Procedure: 2D Echo, Cardiac Doppler and Color Doppler (Both Spectral and Color            Flow Doppler were utilized during procedure). Indications:    Congestive Heart Failure  History:        Patient has prior history of Echocardiogram examinations, most                 recent  03/16/2024. CHF, Stroke, Signs/Symptoms:Shortness of                 Breath and Dyspnea; Risk Factors:Former Smoker, Hypertension,                 Diabetes and Dyslipidemia.  Sonographer:    VALENTE, ADAM Referring Phys: [id number] BRITTAINY M SIMMONS IMPRESSIONS  1. Left ventricular ejection fraction, by estimation, is 40 to 45%. Left ventricular ejection fraction by PLAX is 43 %. The left ventricle has mildly decreased function. The left ventricle demonstrates global hypokinesis. There is severe left ventricular hypertrophy. Left ventricular diastolic parameters are consistent with Grade III diastolic dysfunction (restrictive). Elevated left ventricular end-diastolic pressure. Avg E' 4.6 cm/s.  2. Right ventricular systolic function is moderately reduced. The right ventricular size is mildly enlarged. Mildly increased right ventricular wall thickness. There is mildly elevated pulmonary artery systolic pressure. The estimated right ventricular systolic pressure is 41.6 mmHg.  3. Left atrial size was mildly dilated.  4. The mitral valve is grossly normal. Trivial mitral valve regurgitation.  5. The aortic valve is tricuspid. Aortic valve regurgitation is not visualized.  6. The inferior vena cava is dilated in size with <50% respiratory variability, suggesting right atrial pressure of 15 mmHg. Comparison(s): Changes from prior study are noted. 02/25/2024: LVEF 55-60%. FINDINGS  Left Ventricle: Left ventricular ejection fraction, by estimation, is 40 to 45%. Left ventricular ejection fraction by PLAX is 43 %. The left ventricle has mildly decreased function. The left ventricle demonstrates global hypokinesis. The left ventricular internal cavity size was normal in size. There is severe left ventricular hypertrophy. Left ventricular diastolic parameters are consistent with Grade III diastolic dysfunction (restrictive). Elevated left ventricular end-diastolic pressure. Right Ventricle: The right ventricular size is mildly  enlarged. Mildly increased right ventricular wall thickness. Right ventricular systolic function is moderately reduced. There is mildly elevated pulmonary artery systolic pressure. The tricuspid regurgitant velocity is 2.58 m/s, and with an assumed right atrial pressure of 15 mmHg, the estimated right ventricular systolic pressure is 41.6 mmHg. Left Atrium: Left atrial size was mildly dilated. Right Atrium: Right atrial size was normal in size. Pericardium: There is no evidence of pericardial effusion. Mitral Valve: The mitral valve is grossly normal. Trivial mitral valve regurgitation. Tricuspid Valve: The tricuspid valve is grossly normal. Tricuspid valve regurgitation is mild. Aortic Valve: The aortic valve is tricuspid. Aortic  valve regurgitation is not visualized. Pulmonic Valve: The pulmonic valve was grossly normal. Pulmonic valve regurgitation is trivial. Aorta: The aortic root and ascending aorta are structurally normal, with no evidence of dilitation. Venous: The inferior vena cava is dilated in size with less than 50% respiratory variability, suggesting right atrial pressure of 15 mmHg. IAS/Shunts: No atrial level shunt detected by color flow Doppler.  LEFT VENTRICLE PLAX 2D LV EF:         Left            Diastology                ventricular     LV e' medial:  4.54 cm/s                ejection        LV e' lateral: 4.78 cm/s                fraction by                PLAX is 43                %. LVIDd:         4.30 cm LVIDs:         3.40 cm LV PW:         1.60 cm LV IVS:        1.30 cm LVOT diam:     2.20 cm LV SV:         65 LV SV Index:   27 LVOT Area:     3.80 cm  RIGHT VENTRICLE          IVC RV Basal diam:  4.40 cm  IVC diam: 2.10 cm RV Mid diam:    4.00 cm LEFT ATRIUM             Index        RIGHT ATRIUM           Index LA diam:        4.60 cm 1.93 cm/m   RA Area:     16.60 cm LA Vol (A2C):   75.3 ml 31.61 ml/m  RA Volume:   46.20 ml  19.39 ml/m LA Vol (A4C):   67.3 ml 28.25 ml/m LA Biplane  Vol: 78.1 ml 32.78 ml/m  AORTIC VALVE LVOT Vmax:   96.00 cm/s LVOT Vmean:  60.800 cm/s LVOT VTI:    0.172 m  AORTA Ao Root diam: 3.40 cm TRICUSPID VALVE TR Peak grad:   26.6 mmHg TR Vmax:        258.00 cm/s  SHUNTS Systemic VTI:  0.17 m Systemic Diam: 2.20 cm Vinie Maxcy MD Electronically signed by Vinie Maxcy MD Signature Date/Time: 04/21/2024/5:05:22 PM    Final      Medications:     Scheduled Medications:  sodium chloride    Intravenous Once   buPROPion   300 mg Oral Daily   insulin  aspart  0-9 Units Subcutaneous TID WC   midodrine  15 mg Oral TID WC   pantoprazole   40 mg Oral BID   rosuvastatin   20 mg Oral Daily   simethicone   240 mg Oral Once    Infusions:   PRN Medications: acetaminophen  **OR** acetaminophen , ondansetron  **OR** ondansetron  (ZOFRAN ) IV      Assessment/Plan   1. Acute on chronic HF with mid range EF: Echo in 8/25 showed EF 45-50%, severe LVH, abnormal RV free wall strain, ~17%, RV mildly reduced.  Cardiac  MRI showed moderate concentric LVH with EF 43%, RV EF 47%, ECV 36%, diffuse patchy LGE throughout LV myocardium. PYP scan negative.  He had an endomyocardial biopsy that was negative for cardiac amyloidosis.  I think that he is most likely to have hypertrophic cardiomyopathy.  He was admitted with GI bleeding, given blood products and IVF and is now volume overloaded and w/ AKI, SCr continues to trend up 4.00>>4.25>>4.49 today, K 4.0. Echo yesterday EF 40-45% (previously 45-50%), severe LVH, RV mod reduced, RVSP 42, dilated IVC estimated RAP ~15 mmHg  - Resume IV diuretics, Lasix  120 mg bid  - Continue midodrine for now to maintain BP - Will tentatively plan RHC Friday morning.  2. AKI on CKD stage 3b: ?ATN in setting of hypotension from GI bleeding.  Creatinine continues to rise today.  He is now volume overloaded.   - follow response to IV Lasix   - Follow BMET 3. Atrial fibrillation: Paroxysmal. Junctional rhythm on tele, HR 60s  - Eliquis  on hold with  GI bleeding. Per GI can resume in 48 rhs   4. GI bleeding: Has had transfusions.  EGD with gastritis.  Colonoscopy w/ 2 polyps (resected) and non bleeding hemorrhoids. - no further gross bleeding  - Hgb trending back up, 7.6>>8.3  - per GI, can resume Eliquis  in 48 hrs 5. ETOH abuse.  - he reports he quit ~6 wks ago    Length of Stay: [address], [place]  04/22/2024, 9:51 AM  Advanced Heart Failure Team Pager [phone] (M-F; 7a - 5p)  Please contact [facility] for night-coverage after hours (5p -7a ) and weekends on [email or web address]  Patient seen with PA, I formulated the plan and agree with the above note.   Hematochezia has resolved, hgb 8.3.  Colonoscopy today with polyps removed.   Echo showed EF 40-45%, severe LVH, moderate RV dysfunction with mild RV dilation, IVC dilated.    SBP in 90s, still on midodrine.  Creatinine up to 4.49.   Patient denies dyspnea.   General: NAD Neck: JVP 12-14 cm, no thyromegaly or thyroid  nodule.  Lungs: Clear to auscultation bilaterally with normal respiratory effort. CV: Nondisplaced PMI.  Heart regular S1/S2, no S3/S4, no murmur.  1+ edema to knees.  Abdomen: Soft, nontender, no hepatosplenomegaly, no distention.  Skin: Intact without lesions or rashes.  Neurologic: Alert and oriented x 3.  Psych: Normal affect. Extremities: No clubbing or cyanosis.  HEENT: Normal.   I worry he has had ATN from hypotension in setting of GI bleeding with rise in creatinine again to 4.49.  He is volume overloaded with dilated IVC and JVD.  Echo yesterday as above.  - Challenge with Lasix  120 mg IV bid.  - Will set up RHC in am.   GI bleeding seems to have subsided.  Transfuse hgb < 7 at this point.    He appears to be in ?junctional rhythm.  Will get ECG.  He has h/o PAF and is on Eliquis .  Can restart Eliquis  in 48 hrs per GI.   Ezra Shuck 04/22/2024 1:32 PM

## 2024-04-22 NOTE — Plan of Care (Signed)

## 2024-04-22 NOTE — Op Note (Signed)
 [facility] Patient Name: Keith Leblanc Procedure Date : 04/22/2024 MRN: [id number] Attending MD: Glendia BRAVO. Stacia , MD, [id number] Date of Birth: 1966-03-20 CSN: [id number] Age: 58 Admit Type: Inpatient Procedure:                Colonoscopy Indications:              Acute post hemorrhagic anemia, melena, hematochezia Providers:                Glendia E. Stacia, MD, Darleene Bare, RN, Haskel Chris, Technician Referring MD:              Medicines:                Monitored Anesthesia Care Complications:            No immediate complications. Estimated Blood Loss:     Estimated blood loss was minimal. Procedure:                Pre-Anesthesia Assessment:                           - Prior to the procedure, a History and Physical                            was performed, and patient medications and                            allergies were reviewed. The patient's tolerance of                            previous anesthesia was also reviewed. The risks                            and benefits of the procedure and the sedation                            options and risks were discussed with the patient.                            All questions were answered, and informed consent                            was obtained. Prior Anticoagulants: The patient has                            taken Eliquis  (apixaban ), last dose was 5 days                            prior to procedure. ASA Grade Assessment: III - A                            patient with severe systemic disease. After  reviewing the risks and benefits, the patient was                            deemed in satisfactory condition to undergo the                            procedure.                           After obtaining informed consent, the colonoscope                            was passed under direct vision. Throughout the                            procedure,  the patient's blood pressure, pulse, and                            oxygen saturations were monitored continuously. The                            CF-HQ190L ([id number]) Olympus colonoscope was                            introduced through the anus and advanced to the the                            terminal ileum, with identification of the                            appendiceal orifice and IC valve. The colonoscopy                            was performed without difficulty. The patient                            tolerated the procedure well. The quality of the                            bowel preparation was excellent. The terminal                            ileum, ileocecal valve, appendiceal orifice, and                            rectum were photographed. The bowel preparation                            used was SUPREP via split dose instruction. Scope In: 8:31:32 AM Scope Out: 8:47:21 AM Scope Withdrawal Time: 0 hours 10 minutes 33 seconds  Total Procedure Duration: 0 hours 15 minutes 49 seconds  Findings:      The perianal and digital rectal examinations were normal. Pertinent       negatives include normal sphincter tone and no palpable rectal lesions.  Two sessile polyps were found in the transverse colon. The polyps were 3       to 5 mm in size. These polyps were removed with a cold snare. Resection       and retrieval were complete. Estimated blood loss was minimal.      The exam was otherwise normal throughout the examined colon.      The terminal ileum appeared normal.      Non-bleeding internal hemorrhoids were found during retroflexion. The       hemorrhoids were large and Grade I (internal hemorrhoids that do not       prolapse).      No additional abnormalities were found on retroflexion. Impression:               - Two 3 to 5 mm polyps in the transverse colon,                            removed with a cold snare. Resected and retrieved.                           - The  examined portion of the ileum was normal.                           - Non-bleeding internal hemorrhoids. This is the                            source of the patient's hematochezia, but doubt                            they would account for the patient's drop in                            hemoglobin.                           - Suspect the patient may have bleed from the                            friable gastric mucosa noted on his EGD. Moderate Sedation:      N/A Recommendation:           - Return patient to hospital ward for ongoing care.                           - Resume regular diet.                           - Continue present medications.                           - Resume Eliquis  (apixaban ) at prior dose in 2 days.                           - Await pathology results.                           - Repeat colonoscopy (  date not yet determined) for                            surveillance based on pathology results.                           - Recommend daily fiber supplement to optimize                            stool bulk/consistency to reduce hemorrhoidal                            bleeding                           - Can use hydrocortisone suppositories for up to 14                            days to reduce hemorrhoidal swelling/bleeding.                           - If the patient exhibits ongoing bleeding, would                            recommend video capsule endoscopy to exclude small                            bowel sources of bleeding.                           - GI will sign off at this time. Procedure Code(s):        --- Professional ---                           [phone], Colonoscopy, flexible; with removal of                            tumor(s), polyp(s), or other lesion(s) by snare                            technique Diagnosis Code(s):        --- Professional ---                           D12.3, Benign neoplasm of transverse colon (hepatic                             flexure or splenic flexure)                           K64.0, First degree hemorrhoids                           D62, Acute posthemorrhagic anemia CPT copyright 2022 [employer]. All rights reserved. The codes documented in this report are preliminary and upon coder review may  be revised to meet  current compliance requirements. Laquashia Mergenthaler E. Stacia, MD 04/22/2024 8:58:38 AM This report has been signed electronically. Number of Addenda: 0

## 2024-04-22 NOTE — Anesthesia Preprocedure Evaluation (Addendum)
 Anesthesia Evaluation  Patient identified by MRN, date of birth, ID band Patient awake    Reviewed: Allergy & Precautions, NPO status , Patient's Chart, lab work & pertinent test results  Airway Mallampati: II  TM Distance: >3 FB Neck ROM: Full    Dental  (+) Teeth Intact, Dental Advisory Given   Pulmonary former smoker   breath sounds clear to auscultation       Cardiovascular hypertension, Pt. on medications +CHF   Rhythm:Regular Rate:Normal  Echo:   1. Left ventricular ejection fraction, by estimation, is 40 to 45%. Left  ventricular ejection fraction by PLAX is 43 %. The left ventricle has  mildly decreased function. The left ventricle demonstrates global  hypokinesis. There is severe left  ventricular hypertrophy. Left ventricular diastolic parameters are  consistent with Grade III diastolic dysfunction (restrictive). Elevated  left ventricular end-diastolic pressure. Avg E' 4.6 cm/s.   2. Right ventricular systolic function is moderately reduced. The right  ventricular size is mildly enlarged. Mildly increased right ventricular  wall thickness. There is mildly elevated pulmonary artery systolic  pressure. The estimated right ventricular  systolic pressure is 41.6 mmHg.   3. Left atrial size was mildly dilated.   4. The mitral valve is grossly normal. Trivial mitral valve  regurgitation.   5. The aortic valve is tricuspid. Aortic valve regurgitation is not  visualized.   6. The inferior vena cava is dilated in size with <50% respiratory  variability, suggesting right atrial pressure of 15 mmHg.     Neuro/Psych  PSYCHIATRIC DISORDERS      CVA    GI/Hepatic Neg liver ROS,GERD  Medicated,,  Endo/Other  diabetes    Renal/GU Renal disease     Musculoskeletal negative musculoskeletal ROS (+)    Abdominal   Peds  Hematology  (+) Blood dyscrasia, anemia   Anesthesia Other Findings    Reproductive/Obstetrics                              Anesthesia Physical Anesthesia Plan  ASA: 3  Anesthesia Plan: MAC   Post-op Pain Management: Minimal or no pain anticipated   Induction: Intravenous  PONV Risk Score and Plan: 0 and Propofol infusion  Airway Management Planned: Natural Airway and Nasal Cannula  Additional Equipment: None  Intra-op Plan:   Post-operative Plan:   Informed Consent: I have reviewed the patients History and Physical, chart, labs and discussed the procedure including the risks, benefits and alternatives for the proposed anesthesia with the patient or authorized representative who has indicated his/her understanding and acceptance.       Plan Discussed with: CRNA  Anesthesia Plan Comments:          Anesthesia Quick Evaluation

## 2024-04-22 NOTE — Progress Notes (Addendum)
 Advanced Heart Failure Rounding Note  Cardiologist: None  Chief Complaint: A/c HFmrEF, GIB   Patient Profile   58 y/o male w/ prior h/o heavy ETOH use (recently quit), HFmrEF, CKDIIIa, PAF on Eliquis , HTN, HLD and Type 2DM, admitted w/ symptomatic ABLA 2/2 GIB. HF management c/b persistent hypotension and AKI despite adequate volume resuscitation. AHF team asked to assist.   Subjective:    Echo yesterday EF 40-45% (previously 45-50%), severe LVH, RV mod reduced, RVSP 42, dilated IVC estimated RAP ~15 mmHg   Diuretics held yesterday for bowel prep. SCr 4.00>>4.25>>4.49 today, K 4.0   SBPs still low 90s on midodrine  Hgb 7.6>>8.3   Just returned from colonoscopy, 2 polys resected, non bleeding hemorrhoids.   Objective:   Weight Range: 105.2 kg Body mass index is 27.51 kg/m.   Vital Signs:   Temp:  [97.1 F (36.2 C)-98.5 F (36.9 C)] 97.1 F (36.2 C) (10/30 0855) Pulse Rate:  [55-67] 60 (10/30 0910) Resp:  [16-25] 25 (10/30 0910) BP: (86-107)/(58-76) 91/64 (10/30 0910) SpO2:  [96 %-100 %] 97 % (10/30 0910) Last BM Date : 04/21/24  Weight change: Filed Weights   04/19/24 0910  Weight: 105.2 kg    Intake/Output:   Intake/Output Summary (Last 24 hours) at 04/22/2024 0951 Last data filed at 04/21/2024 1806 Gross per 24 hour  Intake 1469.67 ml  Output --  Net 1469.67 ml      Physical Exam    General:  Well appearing. No resp difficulty HEENT: Normal Neck: JVD 14 cm  Rnm:Pmmzhlojmob regular rate & rhythm. No rubs, gallops or murmurs. Lungs: diminished at the bases bilaterally  Abdomen: Soft, nontender, nondistended. No hepatosplenomegaly. No bruits or masses. Good bowel sounds. Extremities: No cyanosis, clubbing, rash, 1+ b/l LE edema Neuro: Alert & orientedx3, cranial nerves grossly intact. moves all 4 extremities w/o difficulty. Affect pleasant   Telemetry    Junctional rhythm 60s   EKG    N/A   Labs    CBC Recent Labs    04/21/24 0319  04/22/24 0404  WBC 7.9 7.5  HGB 7.6* 8.3*  HCT 23.7* 26.0*  MCV 84.3 84.1  PLT 396 453*   Basic Metabolic Panel Recent Labs    89/70/74 0319 04/22/24 0404  NA 135 139  K 3.8 4.0  CL 102 102  CO2 18* 20*  GLUCOSE 142* 157*  BUN 77* 71*  CREATININE 4.25* 4.49*  CALCIUM  8.9 8.8*   Liver Function Tests No results for input(s): AST, ALT, ALKPHOS, BILITOT, PROT, ALBUMIN in the last 72 hours. No results for input(s): LIPASE, AMYLASE in the last 72 hours. Cardiac Enzymes No results for input(s): CKTOTAL, CKMB, CKMBINDEX, TROPONINI in the last 72 hours.  BNP: BNP (last 3 results) Recent Labs    03/24/24 1624 04/17/24 1704  BNP 1,093.8* 1,324.0*    ProBNP (last 3 results) Recent Labs    03/12/24 0942  PROBNP 5,364.0*     D-Dimer No results for input(s): DDIMER in the last 72 hours. Hemoglobin A1C No results for input(s): HGBA1C in the last 72 hours. Fasting Lipid Panel No results for input(s): CHOL, HDL, LDLCALC, TRIG, CHOLHDL, LDLDIRECT in the last 72 hours. Thyroid  Function Tests No results for input(s): TSH, T4TOTAL, T3FREE, THYROIDAB in the last 72 hours.  Invalid input(s): FREET3  Other results:   Imaging    ECHOCARDIOGRAM COMPLETE Result Date: 04/21/2024    ECHOCARDIOGRAM REPORT   Patient Name:   Keith Leblanc Date of Exam: 04/21/2024 Medical Rec #:  [id number]       Height:       77.0 in Accession #:    [id number]      Weight:       232.0 lb Date of Birth:  23-Jun-1966       BSA:          2.382 m Patient Age:    58 years        BP:           87/70 mmHg Patient Gender: M               HR:           50 bpm. Exam Location:  Inpatient Procedure: 2D Echo, Cardiac Doppler and Color Doppler (Both Spectral and Color            Flow Doppler were utilized during procedure). Indications:    Congestive Heart Failure  History:        Patient has prior history of Echocardiogram examinations, most                 recent  03/16/2024. CHF, Stroke, Signs/Symptoms:Shortness of                 Breath and Dyspnea; Risk Factors:Former Smoker, Hypertension,                 Diabetes and Dyslipidemia.  Sonographer:    VALENTE, ADAM Referring Phys: [id number] BRITTAINY M SIMMONS IMPRESSIONS  1. Left ventricular ejection fraction, by estimation, is 40 to 45%. Left ventricular ejection fraction by PLAX is 43 %. The left ventricle has mildly decreased function. The left ventricle demonstrates global hypokinesis. There is severe left ventricular hypertrophy. Left ventricular diastolic parameters are consistent with Grade III diastolic dysfunction (restrictive). Elevated left ventricular end-diastolic pressure. Avg E' 4.6 cm/s.  2. Right ventricular systolic function is moderately reduced. The right ventricular size is mildly enlarged. Mildly increased right ventricular wall thickness. There is mildly elevated pulmonary artery systolic pressure. The estimated right ventricular systolic pressure is 41.6 mmHg.  3. Left atrial size was mildly dilated.  4. The mitral valve is grossly normal. Trivial mitral valve regurgitation.  5. The aortic valve is tricuspid. Aortic valve regurgitation is not visualized.  6. The inferior vena cava is dilated in size with <50% respiratory variability, suggesting right atrial pressure of 15 mmHg. Comparison(s): Changes from prior study are noted. 02/25/2024: LVEF 55-60%. FINDINGS  Left Ventricle: Left ventricular ejection fraction, by estimation, is 40 to 45%. Left ventricular ejection fraction by PLAX is 43 %. The left ventricle has mildly decreased function. The left ventricle demonstrates global hypokinesis. The left ventricular internal cavity size was normal in size. There is severe left ventricular hypertrophy. Left ventricular diastolic parameters are consistent with Grade III diastolic dysfunction (restrictive). Elevated left ventricular end-diastolic pressure. Right Ventricle: The right ventricular size is mildly  enlarged. Mildly increased right ventricular wall thickness. Right ventricular systolic function is moderately reduced. There is mildly elevated pulmonary artery systolic pressure. The tricuspid regurgitant velocity is 2.58 m/s, and with an assumed right atrial pressure of 15 mmHg, the estimated right ventricular systolic pressure is 41.6 mmHg. Left Atrium: Left atrial size was mildly dilated. Right Atrium: Right atrial size was normal in size. Pericardium: There is no evidence of pericardial effusion. Mitral Valve: The mitral valve is grossly normal. Trivial mitral valve regurgitation. Tricuspid Valve: The tricuspid valve is grossly normal. Tricuspid valve regurgitation is mild. Aortic Valve: The aortic valve is tricuspid. Aortic  valve regurgitation is not visualized. Pulmonic Valve: The pulmonic valve was grossly normal. Pulmonic valve regurgitation is trivial. Aorta: The aortic root and ascending aorta are structurally normal, with no evidence of dilitation. Venous: The inferior vena cava is dilated in size with less than 50% respiratory variability, suggesting right atrial pressure of 15 mmHg. IAS/Shunts: No atrial level shunt detected by color flow Doppler.  LEFT VENTRICLE PLAX 2D LV EF:         Left            Diastology                ventricular     LV e' medial:  4.54 cm/s                ejection        LV e' lateral: 4.78 cm/s                fraction by                PLAX is 43                %. LVIDd:         4.30 cm LVIDs:         3.40 cm LV PW:         1.60 cm LV IVS:        1.30 cm LVOT diam:     2.20 cm LV SV:         65 LV SV Index:   27 LVOT Area:     3.80 cm  RIGHT VENTRICLE          IVC RV Basal diam:  4.40 cm  IVC diam: 2.10 cm RV Mid diam:    4.00 cm LEFT ATRIUM             Index        RIGHT ATRIUM           Index LA diam:        4.60 cm 1.93 cm/m   RA Area:     16.60 cm LA Vol (A2C):   75.3 ml 31.61 ml/m  RA Volume:   46.20 ml  19.39 ml/m LA Vol (A4C):   67.3 ml 28.25 ml/m LA Biplane  Vol: 78.1 ml 32.78 ml/m  AORTIC VALVE LVOT Vmax:   96.00 cm/s LVOT Vmean:  60.800 cm/s LVOT VTI:    0.172 m  AORTA Ao Root diam: 3.40 cm TRICUSPID VALVE TR Peak grad:   26.6 mmHg TR Vmax:        258.00 cm/s  SHUNTS Systemic VTI:  0.17 m Systemic Diam: 2.20 cm Vinie Maxcy MD Electronically signed by Vinie Maxcy MD Signature Date/Time: 04/21/2024/5:05:22 PM    Final      Medications:     Scheduled Medications:  sodium chloride    Intravenous Once   buPROPion   300 mg Oral Daily   insulin  aspart  0-9 Units Subcutaneous TID WC   midodrine  15 mg Oral TID WC   pantoprazole   40 mg Oral BID   rosuvastatin   20 mg Oral Daily   simethicone   240 mg Oral Once    Infusions:   PRN Medications: acetaminophen  **OR** acetaminophen , ondansetron  **OR** ondansetron  (ZOFRAN ) IV      Assessment/Plan   1. Acute on chronic HF with mid range EF: Echo in 8/25 showed EF 45-50%, severe LVH, abnormal RV free wall strain, ~17%, RV mildly reduced.  Cardiac  MRI showed moderate concentric LVH with EF 43%, RV EF 47%, ECV 36%, diffuse patchy LGE throughout LV myocardium. PYP scan negative.  He had an endomyocardial biopsy that was negative for cardiac amyloidosis.  I think that he is most likely to have hypertrophic cardiomyopathy.  He was admitted with GI bleeding, given blood products and IVF and is now volume overloaded and w/ AKI, SCr continues to trend up 4.00>>4.25>>4.49 today, K 4.0. Echo yesterday EF 40-45% (previously 45-50%), severe LVH, RV mod reduced, RVSP 42, dilated IVC estimated RAP ~15 mmHg  - Resume IV diuretics, Lasix  120 mg bid  - Continue midodrine for now to maintain BP - Will tentatively plan RHC Friday morning.  2. AKI on CKD stage 3b: ?ATN in setting of hypotension from GI bleeding.  Creatinine continues to rise today.  He is now volume overloaded.   - follow response to IV Lasix   - Follow BMET 3. Atrial fibrillation: Paroxysmal. Junctional rhythm on tele, HR 60s  - Eliquis  on hold with  GI bleeding. Per GI can resume in 48 rhs   4. GI bleeding: Has had transfusions.  EGD with gastritis.  Colonoscopy w/ 2 polyps (resected) and non bleeding hemorrhoids. - no further gross bleeding  - Hgb trending back up, 7.6>>8.3  - per GI, can resume Eliquis  in 48 hrs 5. ETOH abuse.  - he reports he quit ~6 wks ago    Length of Stay: [address], [place]  04/22/2024, 9:51 AM  Advanced Heart Failure Team Pager [phone] (M-F; 7a - 5p)  Please contact [facility] for night-coverage after hours (5p -7a ) and weekends on [email or web address]  Patient seen with PA, I formulated the plan and agree with the above note.   Hematochezia has resolved, hgb 8.3.  Colonoscopy today with polyps removed.   Echo showed EF 40-45%, severe LVH, moderate RV dysfunction with mild RV dilation, IVC dilated.    SBP in 90s, still on midodrine.  Creatinine up to 4.49.   Patient denies dyspnea.   General: NAD Neck: JVP 12-14 cm, no thyromegaly or thyroid  nodule.  Lungs: Clear to auscultation bilaterally with normal respiratory effort. CV: Nondisplaced PMI.  Heart regular S1/S2, no S3/S4, no murmur.  1+ edema to knees.  Abdomen: Soft, nontender, no hepatosplenomegaly, no distention.  Skin: Intact without lesions or rashes.  Neurologic: Alert and oriented x 3.  Psych: Normal affect. Extremities: No clubbing or cyanosis.  HEENT: Normal.   I worry he has had ATN from hypotension in setting of GI bleeding with rise in creatinine again to 4.49.  He is volume overloaded with dilated IVC and JVD.  Echo yesterday as above.  - Challenge with Lasix  120 mg IV bid.  - Will set up RHC in am.   GI bleeding seems to have subsided.  Transfuse hgb < 7 at this point.    He appears to be in ?junctional rhythm.  Will get ECG.  He has h/o PAF and is on Eliquis .  Can restart Eliquis  in 48 hrs per GI.   Ezra Shuck 04/22/2024 1:32 PM

## 2024-04-22 NOTE — Transfer of Care (Signed)
 Immediate Anesthesia Transfer of Care Note  Patient: Keith Leblanc  Procedure(s) Performed: COLONOSCOPY  Patient Location: PACU and Endoscopy Unit  Anesthesia Type:MAC  Level of Consciousness: awake and patient cooperative  Airway & Oxygen Therapy: Patient Spontanous Breathing and Patient connected to nasal cannula oxygen  Post-op Assessment: Report given to RN and Post -op Vital signs reviewed and stable  Post vital signs: Reviewed and stable  Last Vitals:  Vitals Value Taken Time  BP 107/63 04/22/24 08:54  Temp    Pulse 64 04/22/24 08:55  Resp 22 04/22/24 08:55  SpO2 100 % 04/22/24 08:55  Vitals shown include unfiled device data.  Last Pain:  Vitals:   04/22/24 0740  TempSrc: Temporal  PainSc: 0-No pain         Complications: No notable events documented.

## 2024-04-22 NOTE — Consult Note (Addendum)
 [facility] Nephrology Consultation Note  Requesting MD: Dr. CHRISTOBAL Guadalajara Reason for consult: AKI on CKD  HPI:  Keith Leblanc is a 58 y.o. male with past medical history significant for hypertension, dyslipidemia, type 2 diabetes, atrial fibrillation on Eliquis , CHF with grade third diastolic dysfunction/concentric LVH who was presented with symptomatic anemia, acute blood loss anemia seen as a consultation for the evaluation of AKI on CKD. The patient was initially presented with worsening shortness of breath, dyspnea on exertion and have bright red blood per rectum.  In the ER patient was hypotensive and the lab consistent with acute blood loss anemia.  He was treated with IV PPI, received blood transfusion and underwent endoscopy by gastroenterologist.  He had EGD on 10/27 which showed congested friable gastric mucosa likely the source of bleeding and colonoscopy on 10/30 showed hemorrhoids.  He was also managed for acute on chronic CHF exacerbation, EF was around 45% however grade third diastolic dysfunction.  Seen by cardiology team and placed on IV diuretics.  Not much response with IV diuretics however it is not accurately documented.  The blood pressure remained persistently low therefore started on midodrine 3 times daily. For CKD, patient was seen once at [facility] 2023 until last follow-up. The recent serum creatinine level fluctuating anywhere around 1.4-2.5 however the creatinine level gradually uptrending to 4.25 yesterday and further up to 4.49 today.  He has hypokalemia, noted albumin level of 2.4. He is currently on room air, blood pressure 91/66.  The patient is frustrated about current situation including declining kidney function, low blood pressure, plan for cardiac cath etc. He denies chest pain or shortness of breath today.  No fever, chills, dysgeusia, nausea or vomiting.  His sister was present at the bedside. No recent NSAID use and no IV contrast use. He was on  potassium chloride , Jardiance  which is currently on hold.  He takes furosemide  40 mg at home.  PMHx:   Past Medical History:  Diagnosis Date   Blood in stool    bright red blood    Chicken pox    Diabetes mellitus (HCC)    Erectile dysfunction    Gout    Hypertension    Stroke [facility])     Past Surgical History:  Procedure Laterality Date   APPENDECTOMY  2004   COLONOSCOPY     ENDOMYOCARDIAL BIOPSY N/A 03/16/2024   Procedure: ENDOMYOCARDIAL BIOPSY;  Surgeon: Zenaida Morene PARAS, MD;  Location: [facility] INVASIVE CV LAB;  Service: Cardiovascular;  Laterality: N/A;   ESOPHAGOGASTRODUODENOSCOPY N/A 04/19/2024   Procedure: EGD (ESOPHAGOGASTRODUODENOSCOPY);  Surgeon: Stacia Glendia BRAVO, MD;  Location: [facility] ENDOSCOPY;  Service: Gastroenterology;  Laterality: N/A;   RIGHT HEART CATH N/A 03/16/2024   Procedure: RIGHT HEART CATH;  Surgeon: Zenaida Morene PARAS, MD;  Location: [facility] INVASIVE CV LAB;  Service: Cardiovascular;  Laterality: N/A;   TOOTH EXTRACTION  07/06/2020    Family Hx:  Family History  Problem Relation Age of Onset   Alcohol abuse Father    Hypertension Father    Heart disease Father    Gout Father    Breast cancer Mother    Hypertension Mother    Gout Mother    Healthy Sister    Healthy Daughter    Colon cancer Neg Hx    Esophageal cancer Neg Hx    Rectal cancer Neg Hx    Stomach cancer Neg Hx     Social History:  reports that he quit smoking about 6 weeks ago. His smoking  use included cigarettes and cigars. He has never used smokeless tobacco. He reports that he does not currently use alcohol. He reports that he does not currently use drugs after having used the following drugs: Marijuana.  Allergies: No Known Allergies  Medications: Prior to Admission medications   Medication Sig Start Date End Date Taking? Authorizing Provider  apixaban  (ELIQUIS ) 5 MG TABS tablet Take 1 tablet (5 mg total) by mouth 2 (two) times daily. 04/08/24  Yes Hayes Beckey CROME, NP  bisoprolol   (ZEBETA ) 10 MG tablet Take 2 tablets (20 mg total) by mouth daily. 04/08/24  Yes Hayes Beckey CROME, NP  buPROPion  (WELLBUTRIN  XL) 300 MG 24 hr tablet TAKE 1 TABLET BY MOUTH ONCE  DAILY 04/13/24  Yes Nafziger, Darleene, NP  colchicine  0.6 MG tablet TAKE 1 TABLET BY MOUTH DAILY  WHEN HAVING FLARES TAKE 1 TABLET BY MOUTH TWICE DAILY Patient taking differently: Take 0.6 mg by mouth as needed. TAKE 1 TABLET BY MOUTH  DAILY WHEN HAVING FLARES  TAKE 1 TABLET BY MOUTH  TWICE DAILY 09/05/23  Yes Nafziger, Darleene, NP  empagliflozin  (JARDIANCE ) 10 MG TABS tablet Take 1 tablet (10 mg total) by mouth daily. 04/08/24  Yes Hayes Beckey CROME, NP  furosemide  (LASIX ) 20 MG tablet Take 2 tablets (40 mg total) by mouth as needed (For increased swelling, weight gain of 3 LB in 1 day or 5 LB in 1 week). 04/08/24  Yes Hayes Beckey CROME, NP  hydrOXYzine (VISTARIL) 50 MG capsule Take 1 capsule (50 mg total) by mouth every 8 (eight) hours as needed. Patient taking differently: Take 50 mg by mouth every 8 (eight) hours as needed for itching. 03/23/24  Yes Nafziger, Darleene, NP  magnesium  oxide (MAG-OX) 400 (240 Mg) MG tablet TAKE 1 TABLET(400 MG) BY MOUTH DAILY 02/12/24  Yes West, Katlyn D, NP  Multiple Vitamins-Minerals (ONE-A-DAY MENS 50+) TABS Take 1 tablet by mouth daily with breakfast.   Yes [provider]  omeprazole  (PRILOSEC) 40 MG capsule TAKE 1 CAPSULE BY MOUTH DAILY Patient taking differently: Take 40 mg by mouth daily before breakfast. 11/12/23  Yes Nafziger, Darleene, NP  potassium chloride  SA (KLOR-CON  M) 20 MEQ tablet Take 2 tablets (40 mEq total) by mouth daily as needed (take with lasix  as needed dose). 04/08/24  Yes Hayes Beckey CROME, NP  rosuvastatin  (CRESTOR ) 20 MG tablet Take 1 tablet (20 mg total) by mouth daily. 04/08/24  Yes Hayes Beckey CROME, NP  thiamine  (VITAMIN B-1) 100 MG tablet Take 1 tablet (100 mg total) by mouth daily. 11/11/22  Yes Toberman, Stevi W, NP  triamcinolone cream (KENALOG) 0.5 % Apply 1 Application topically 2 (two)  times daily. 03/23/24  Yes Nafziger, Darleene, NP  blood glucose meter kit and supplies Dispense based on patient and insurance preference. Use up to four times daily as directed. (FOR ICD-9 250.00, 250.01). 07/09/16   Sebastian Toribio GAILS, MD  glucose blood (ONETOUCH ULTRA TEST) test strip Use to check blood sugar 2-3 times a day. 03/01/24   Trixie File, MD  Insulin  Pen Needle (B-D UF III MINI PEN NEEDLES) 31G X 5 MM MISC USE 4 TIMES DAILY AFTER MEALS AND AT BEDTIME 02/24/20   Trixie File, MD  Lancets [facility] ULTRASOFT) lancets Test blood sugars as directed. Dx E11.9 08/06/16   Nafziger, Cory, NP  nicotine  (NICODERM CQ  - DOSED IN MG/24 HR) 7 mg/24hr patch Place 1 patch (7 mg total) onto the skin daily. Patient not taking: Reported on 04/17/2024 03/19/24   Fairy,  Sigurd, MD  potassium chloride  (KLOR-CON ) 10 MEQ tablet TAKE 1 TABLET(10 MEQ) BY MOUTH DAILY Patient not taking: Reported on 04/17/2024 04/08/24   Hayes Beckey CROME, NP    I have reviewed the patient's current medications.  Labs: Renal Panel: Recent Labs  Lab 04/17/24 1704 04/19/24 0447 04/20/24 0435 04/21/24 0319 04/22/24 0404  NA 137 138 138 135 139  K 4.4 4.3 3.7 3.8 4.0  CL 105 105 107 102 102  CO2 21* 21* 19* 18* 20*  GLUCOSE 174* 150* 155* 142* 157*  BUN 57* 73* 76* 77* 71*  CREATININE 3.13* 3.97* 4.00* 4.25* 4.49*  CALCIUM  9.1 8.8* 8.6* 8.9 8.8*  MG  --  2.3  --   --   --   PHOS  --  6.2*  --   --   --      CBC:    Latest Ref Rng & Units 04/22/2024    4:04 AM 04/21/2024    3:19 AM 04/20/2024    4:35 AM  CBC  WBC 4.0 - 10.5 K/uL 7.5  7.9  7.9   Hemoglobin 13.0 - 17.0 g/dL 8.3  7.6  7.0   Hematocrit 39.0 - 52.0 % 26.0  23.7  21.9   Platelets 150 - 400 K/uL 453  396  410      Anemia Panel:  Recent Labs    04/19/24 0447 04/19/24 2314 04/20/24 0435 04/21/24 0319 04/22/24 0404  HGB 7.4* 7.1* 7.0* 7.6* 8.3*  MCV 86.9  --  84.9 84.3 84.1    Recent Labs  Lab 04/18/24 2343 04/19/24 0447  AST 71* 73*   ALT 91* 102*  ALKPHOS 104 92  BILITOT 1.2 1.0  PROT 5.6* 5.3*  ALBUMIN 2.7* 2.4*    Lab Results  Component Value Date   HGBA1C 5.4 03/12/2024    ROS:  Pertinent items noted in HPI and remainder of comprehensive ROS otherwise negative.  Physical Exam: Vitals:   04/22/24 0910 04/22/24 1124  BP: 91/64 91/66  Pulse: 60 60  Resp: (!) 25   Temp:  97.9 F (36.6 C)  SpO2: 97% 100%     General exam: Appears calm and comfortable  Respiratory system: Clear to auscultation. Respiratory effort normal. No wheezing or crackle Cardiovascular system: S1 & S2 heard, RRR.  No pedal edema. Gastrointestinal system: Abdomen is nondistended, soft and nontender. Normal bowel sounds heard. Central nervous system: Alert and oriented. No focal neurological deficits. Extremities: LE edema + Skin: No rashes, lesions or ulcers Psychiatry: Judgement and insight appear normal. Mood & affect appropriate.   Assessment/Plan:  # Acute kidney injury on CKDIIIb-IV ( b/lcr 1.5-2.5): Acute kidney injury likely due to reduced renal perfusion (low effective circulatory volume) caused by persistent hypotension, severe anemia, ongoing use of diuretics for CHF.  Not on ACE inhibitor, ARB and no recent use of IV contrast. -Currently on furosemide  120 mg with no significant urine output.  He looks comfortable and in room air.  He does have peripheral edema with low serum albumin level. -I agree with right heart catheter tomorrow.  Meantime, I am going to hold further diuretics and treat with IV albumin to increase intravascular volume and to help hypotension. -Further decision about diuretics after right heart cath result. - Continue with strict ins and out, daily lab.  -I am going to check UA, UPC, and kidney ultrasound to r/o obstruction which is less likely and to assess the parenchyma chronicity. - Continue to avoid ACE inhibitor, ARB or SGLT 2  inhibitor.  # Acute blood loss anemia due to GI bleed, symptomatic.   Seen by gastroenterologist and underwent upper and lower GI endoscopy.  Received multiple units of blood transfusion with improvement of hemoglobin.  Back on anticoagulation now.  # Persistent hypotension: Holding evening dose of diuretics.  Not on any antihypertensives.  Agree with midodrine and rx with albumin.  # Acute on chronic CHF with EF of 45 to 50%, diastolic dysfunction: Plan for right heart cath tomorrow.  Received a dose of IV Lasix  120 mg today.  Hold further diuretics until right heart cath as discussed above.  I have discussed with Dr. Rolan from HF.  # Metabolic acidosis: CO2 20, trend lab.  Thank you very much for the consult.  Our team will continue to follow. Discussed with the patient, his sister and heart failure team personally.  Donya Hitch Amelie Romney 04/22/2024, 1:41 PM  [employer].

## 2024-04-23 ENCOUNTER — Inpatient Hospital Stay (HOSPITAL_COMMUNITY)

## 2024-04-23 ENCOUNTER — Encounter (HOSPITAL_COMMUNITY)
Admission: EM | Disposition: A | Discharge status: Home / Self Care | Payer: Self-pay | Source: Home / Self Care | Attending: Internal Medicine

## 2024-04-23 ENCOUNTER — Encounter (HOSPITAL_COMMUNITY): Payer: Self-pay | Admitting: Internal Medicine

## 2024-04-23 DIAGNOSIS — N1832 Chronic kidney disease, stage 3b: Secondary | ICD-10-CM

## 2024-04-23 DIAGNOSIS — I480 Paroxysmal atrial fibrillation: Secondary | ICD-10-CM | POA: Diagnosis not present

## 2024-04-23 DIAGNOSIS — I509 Heart failure, unspecified: Secondary | ICD-10-CM

## 2024-04-23 DIAGNOSIS — N179 Acute kidney failure, unspecified: Secondary | ICD-10-CM | POA: Diagnosis not present

## 2024-04-23 DIAGNOSIS — I5020 Unspecified systolic (congestive) heart failure: Secondary | ICD-10-CM

## 2024-04-23 DIAGNOSIS — K922 Gastrointestinal hemorrhage, unspecified: Secondary | ICD-10-CM | POA: Diagnosis not present

## 2024-04-23 DIAGNOSIS — I5043 Acute on chronic combined systolic (congestive) and diastolic (congestive) heart failure: Secondary | ICD-10-CM | POA: Diagnosis not present

## 2024-04-23 HISTORY — PX: RIGHT HEART CATH: CATH118263

## 2024-04-23 LAB — CBC
HCT: 22.9 % — ABNORMAL LOW (ref 39.0–52.0)
Hemoglobin: 7.1 g/dL — ABNORMAL LOW (ref 13.0–17.0)
MCH: 26 pg (ref 26.0–34.0)
MCHC: 31 g/dL (ref 30.0–36.0)
MCV: 83.9 fL (ref 80.0–100.0)
Platelets: 362 K/uL (ref 150–400)
RBC: 2.73 MIL/uL — ABNORMAL LOW (ref 4.22–5.81)
RDW: 15.9 % — ABNORMAL HIGH (ref 11.5–15.5)
WBC: 7.4 K/uL (ref 4.0–10.5)
nRBC: 0.9 % — ABNORMAL HIGH (ref 0.0–0.2)

## 2024-04-23 LAB — BASIC METABOLIC PANEL WITH GFR
Anion gap: 18 — ABNORMAL HIGH (ref 5–15)
BUN: 70 mg/dL — ABNORMAL HIGH (ref 6–20)
CO2: 18 mmol/L — ABNORMAL LOW (ref 22–32)
Calcium: 8.9 mg/dL (ref 8.9–10.3)
Chloride: 102 mmol/L (ref 98–111)
Creatinine, Ser: 4.82 mg/dL — ABNORMAL HIGH (ref 0.61–1.24)
GFR, Estimated: 13 mL/min — ABNORMAL LOW (ref >60)
Glucose, Bld: 133 mg/dL — ABNORMAL HIGH (ref 70–99)
Potassium: 3.6 mmol/L (ref 3.5–5.1)
Sodium: 138 mmol/L (ref 135–145)

## 2024-04-23 LAB — ALBUMIN: Albumin: 2.9 g/dL — ABNORMAL LOW (ref 3.5–5.0)

## 2024-04-23 LAB — POCT I-STAT EG7
Acid-base deficit: 6 mmol/L — ABNORMAL HIGH (ref 0.0–2.0)
Acid-base deficit: 6 mmol/L — ABNORMAL HIGH (ref 0.0–2.0)
Bicarbonate: 19 mmol/L — ABNORMAL LOW (ref 20.0–28.0)
Bicarbonate: 19.1 mmol/L — ABNORMAL LOW (ref 20.0–28.0)
Calcium, Ion: 1.21 mmol/L (ref 1.15–1.40)
Calcium, Ion: 1.21 mmol/L (ref 1.15–1.40)
HCT: 23 % — ABNORMAL LOW (ref 39.0–52.0)
HCT: 24 % — ABNORMAL LOW (ref 39.0–52.0)
Hemoglobin: 7.8 g/dL — ABNORMAL LOW (ref 13.0–17.0)
Hemoglobin: 8.2 g/dL — ABNORMAL LOW (ref 13.0–17.0)
O2 Saturation: 46 %
O2 Saturation: 48 %
Potassium: 3.7 mmol/L (ref 3.5–5.1)
Potassium: 3.8 mmol/L (ref 3.5–5.1)
Sodium: 139 mmol/L (ref 135–145)
Sodium: 139 mmol/L (ref 135–145)
TCO2: 20 mmol/L — ABNORMAL LOW (ref 22–32)
TCO2: 20 mmol/L — ABNORMAL LOW (ref 22–32)
pCO2, Ven: 33.2 mmHg — ABNORMAL LOW (ref 44–60)
pCO2, Ven: 33.3 mmHg — ABNORMAL LOW (ref 44–60)
pH, Ven: 7.366 (ref 7.25–7.43)
pH, Ven: 7.367 (ref 7.25–7.43)
pO2, Ven: 26 mmHg — CL (ref 32–45)
pO2, Ven: 27 mmHg — CL (ref 32–45)

## 2024-04-23 LAB — GLUCOSE, CAPILLARY
Glucose-Capillary: 114 mg/dL — ABNORMAL HIGH (ref 70–99)
Glucose-Capillary: 114 mg/dL — ABNORMAL HIGH (ref 70–99)
Glucose-Capillary: 167 mg/dL — ABNORMAL HIGH (ref 70–99)

## 2024-04-23 LAB — HEMOGLOBIN AND HEMATOCRIT, BLOOD
HCT: 25.4 % — ABNORMAL LOW (ref 39.0–52.0)
HCT: 26.6 % — ABNORMAL LOW (ref 39.0–52.0)
Hemoglobin: 8 g/dL — ABNORMAL LOW (ref 13.0–17.0)
Hemoglobin: 8.5 g/dL — ABNORMAL LOW (ref 13.0–17.0)

## 2024-04-23 LAB — COOXEMETRY PANEL
Carboxyhemoglobin: 2.9 % — ABNORMAL HIGH (ref 0.5–1.5)
Methemoglobin: <0.7 % (ref 0.0–1.5)
O2 Saturation: 56.1 %
Total hemoglobin: 8.7 g/dL — ABNORMAL LOW (ref 12.0–16.0)

## 2024-04-23 LAB — PREPARE RBC (CROSSMATCH)

## 2024-04-23 LAB — MAGNESIUM: Magnesium: 2.1 mg/dL (ref 1.7–2.4)

## 2024-04-23 LAB — SURGICAL PATHOLOGY

## 2024-04-23 SURGERY — RIGHT HEART CATH
Anesthesia: LOCAL

## 2024-04-23 MED ORDER — LIDOCAINE HCL (PF) 1 % IJ SOLN
INTRAMUSCULAR | Status: AC
  Filled 2024-04-23: qty 30

## 2024-04-23 MED ORDER — HEPARIN (PORCINE) IN NACL 1000-0.9 UT/500ML-% IV SOLN
INTRAVENOUS | Status: DC | PRN
  Administered 2024-04-23 (×2): 500 mL

## 2024-04-23 MED ORDER — FUROSEMIDE 10 MG/ML IJ SOLN
INTRAMUSCULAR | Status: AC
  Administered 2024-04-23: 120 mg
  Filled 2024-04-23: qty 12

## 2024-04-23 MED ORDER — SODIUM CHLORIDE 0.9% IV SOLUTION: Freq: Once | INTRAVENOUS | Status: DC

## 2024-04-23 MED ORDER — DOBUTAMINE-DEXTROSE 4-5 MG/ML-% IV SOLN
INTRAVENOUS | Status: AC
  Filled 2024-04-23: qty 250

## 2024-04-23 MED ORDER — MILRINONE LACTATE IN DEXTROSE 20-5 MG/100ML-% IV SOLN
INTRAVENOUS | Status: AC
  Filled 2024-04-23: qty 100

## 2024-04-23 MED ORDER — DOBUTAMINE-DEXTROSE 4-5 MG/ML-% IV SOLN
1.0000 ug/kg/min | INTRAVENOUS | Status: DC
  Administered 2024-04-25: 2.5 ug/kg/min via INTRAVENOUS
  Administered 2024-04-28: 4 ug/kg/min via INTRAVENOUS
  Administered 2024-05-01: 1 ug/kg/min via INTRAVENOUS
  Filled 2024-04-23 (×3): qty 250

## 2024-04-23 MED ORDER — SODIUM CHLORIDE 0.9% FLUSH
10.0000 mL | Freq: Two times a day (BID) | INTRAVENOUS | Status: DC
  Administered 2024-04-23 – 2024-04-25 (×4): 10 mL
  Administered 2024-04-25: 20 mL
  Administered 2024-04-26 – 2024-05-02 (×14): 10 mL
  Administered 2024-05-03: 20 mL

## 2024-04-23 MED ORDER — FUROSEMIDE 10 MG/ML IJ SOLN
120.0000 mg | Freq: Two times a day (BID) | INTRAVENOUS | Status: DC
Start: 2024-04-23 — End: 2024-04-29
  Administered 2024-04-24 – 2024-04-28 (×10): 120 mg via INTRAVENOUS
  Filled 2024-04-23: qty 2
  Filled 2024-04-23: qty 12
  Filled 2024-04-23 (×2): qty 10
  Filled 2024-04-23 (×2): qty 120
  Filled 2024-04-23 (×3): qty 10
  Filled 2024-04-23: qty 12
  Filled 2024-04-23 (×2): qty 120

## 2024-04-23 MED ORDER — CHLORHEXIDINE GLUCONATE CLOTH 2 % EX PADS
6.0000 | MEDICATED_PAD | Freq: Every day | CUTANEOUS | Status: DC
  Administered 2024-04-24 – 2024-05-03 (×10): 6 via TOPICAL

## 2024-04-23 MED ORDER — POTASSIUM CHLORIDE CRYS ER 20 MEQ PO TBCR
20.0000 meq | EXTENDED_RELEASE_TABLET | Freq: Once | ORAL | Status: AC
  Administered 2024-04-23: 20 meq via ORAL
  Filled 2024-04-23: qty 1

## 2024-04-23 MED ORDER — FUROSEMIDE 10 MG/ML IJ SOLN: 120.0000 mg | Freq: Two times a day (BID) | INTRAVENOUS | Status: DC

## 2024-04-23 MED ORDER — DOBUTAMINE-DEXTROSE 4-5 MG/ML-% IV SOLN
INTRAVENOUS | Status: DC | PRN
  Administered 2024-04-23: 2.5 ug/kg/min via INTRAVENOUS

## 2024-04-23 MED ORDER — LIDOCAINE HCL (PF) 1 % IJ SOLN
INTRAMUSCULAR | Status: DC | PRN
  Administered 2024-04-23: 2 mL via INTRADERMAL

## 2024-04-23 MED ORDER — SODIUM CHLORIDE 0.9% FLUSH: 10.0000 mL | INTRAVENOUS | Status: DC | PRN

## 2024-04-23 SURGICAL SUPPLY — 6 items
CATH SWAN GANZ 7F STRAIGHT (CATHETERS) IMPLANT
GLIDESHEATH SLENDER 7FR .021G (SHEATH) IMPLANT
GUIDEWIRE .025 260CM (WIRE) IMPLANT
PACK CARDIAC CATHETERIZATION (CUSTOM PROCEDURE TRAY) ×1 IMPLANT
TRANSDUCER W/STOPCOCK (MISCELLANEOUS) IMPLANT
TUBING ART PRESS 72 MALE/FEM (TUBING) IMPLANT

## 2024-04-23 NOTE — Interval H&P Note (Signed)
 History and Physical Interval Note:  04/23/2024 10:41 AM  Keith Leblanc  has presented today for surgery, with the diagnosis of hf.  The various methods of treatment have been discussed with the patient and family. After consideration of risks, benefits and other options for treatment, the patient has consented to  Procedure(s): RIGHT HEART CATH (N/A) as a surgical intervention.  The patient's history has been reviewed, patient examined, no change in status, stable for surgery.  I have reviewed the patient's chart and labs.  Questions were answered to the patient's satisfaction.     Shristi Scheib [employer]

## 2024-04-23 NOTE — Progress Notes (Addendum)
 Advanced Heart Failure Rounding Note  Cardiologist: None  Chief Complaint: A/c HFmrEF, GIB   Patient Profile   58 y/o male w/ prior h/o heavy ETOH use (recently quit), HFmrEF, CKDIIIa, PAF on Eliquis , HTN, HLD and Type 2DM, admitted w/ symptomatic ABLA 2/2 GIB. HF management c/b persistent hypotension and AKI despite adequate volume resuscitation. AHF team asked to assist.   Subjective:    Echo this admit EF 40-45% (previously 45-50%), severe LVH, RV mod reduced, RVSP 42, dilated IVC estimated RAP ~15 mmHg   Pt reports UOP was not robust yesterday despite 120 IV Lasix  x 2.  Only 900 cc charted.   Scr continues to rise, 4.5 today, BUN 71, K 4.0  SBPs still low 90s on midodrine  Hgb 7.6>>8.3>>7.1. Denies further gross bleeding.   Denies any current resting dyspnea.   Scheduled for RHC today    Objective:   Weight Range: 105.2 kg Body mass index is 27.51 kg/m.   Vital Signs:   Temp:  [97.1 F (36.2 C)-98.4 F (36.9 C)] 98.4 F (36.9 C) (10/31 0732) Pulse Rate:  [58-65] 63 (10/31 0732) Resp:  [16-25] 20 (10/31 0732) BP: (86-107)/(55-74) 90/63 (10/31 0732) SpO2:  [97 %-100 %] 100 % (10/31 0732) Weight:  [105.2 kg] 105.2 kg (10/31 0500) Last BM Date : 04/22/24  Weight change: Filed Weights   04/19/24 0910 04/23/24 0500  Weight: 105.2 kg 105.2 kg    Intake/Output:   Intake/Output Summary (Last 24 hours) at 04/23/2024 0840 Last data filed at 04/23/2024 0300 Gross per 24 hour  Intake 1081.07 ml  Output 925 ml  Net 156.07 ml      Physical Exam   GENERAL: NAD Lungs- diminished at bases  CARDIAC:  JVP 10 cm         Normal rate with regular rhythm. No MRG, 1+ b/l LEE  ABDOMEN: Soft, non-tender, non-distended.  EXTREMITIES: Warm and well perfused.  NEUROLOGIC: No obvious FND  Telemetry   Afib low 60s, personally reviewed    EKG   Atrial fibrillation 58 bpm   Labs    CBC Recent Labs    04/22/24 0404 04/23/24 0445  WBC 7.5 7.4  HGB 8.3*  7.1*  HCT 26.0* 22.9*  MCV 84.1 83.9  PLT 453* 362   Basic Metabolic Panel Recent Labs    89/69/74 0404 04/23/24 0445  NA 139 138  K 4.0 3.6  CL 102 102  CO2 20* 18*  GLUCOSE 157* 133*  BUN 71* 70*  CREATININE 4.49* 4.82*  CALCIUM  8.8* 8.9  MG  --  2.1   Liver Function Tests Recent Labs    04/23/24 0445  ALBUMIN 2.9*   No results for input(s): LIPASE, AMYLASE in the last 72 hours. Cardiac Enzymes No results for input(s): CKTOTAL, CKMB, CKMBINDEX, TROPONINI in the last 72 hours.  BNP: BNP (last 3 results) Recent Labs    03/24/24 1624 04/17/24 1704  BNP 1,093.8* 1,324.0*    ProBNP (last 3 results) Recent Labs    03/12/24 0942  PROBNP 5,364.0*     D-Dimer No results for input(s): DDIMER in the last 72 hours. Hemoglobin A1C No results for input(s): HGBA1C in the last 72 hours. Fasting Lipid Panel No results for input(s): CHOL, HDL, LDLCALC, TRIG, CHOLHDL, LDLDIRECT in the last 72 hours. Thyroid  Function Tests No results for input(s): TSH, T4TOTAL, T3FREE, THYROIDAB in the last 72 hours.  Invalid input(s): FREET3  Other results:   Imaging    US  RENAL Result Date:  04/22/2024 CLINICAL DATA:  Acute renal insufficiency. EXAM: RENAL / URINARY TRACT ULTRASOUND COMPLETE COMPARISON:  12/11/2021 FINDINGS: Right Kidney: Renal measurements: 12.0 x 4.6 x 6.0 cm = volume: 174 mL. Echogenicity within normal limits. No mass or hydronephrosis visualized. Left Kidney: Renal measurements: 10.5 x 5.9 x 5.7 cm = volume: 186 mL. Echogenicity within normal limits. No mass or hydronephrosis visualized. Bladder: Appears normal for degree of bladder distention. Bilateral ureteral jets visualized. Other: Small right pleural effusion.  Tiny amount of perihepatic fluid. IMPRESSION: 1. Normal kidneys and bladder. 2. Small right pleural effusion. Tiny amount of perihepatic fluid. Electronically Signed   By: Toribio Agreste M.D.   On: 04/22/2024 16:39      Medications:     Scheduled Medications:  buPROPion   300 mg Oral Daily   insulin  aspart  0-9 Units Subcutaneous TID WC   midodrine  15 mg Oral TID WC   pantoprazole   40 mg Oral BID   rosuvastatin   20 mg Oral Daily   simethicone   240 mg Oral Once    Infusions:   PRN Medications: acetaminophen  **OR** acetaminophen , ondansetron  **OR** ondansetron  (ZOFRAN ) IV      Assessment/Plan   1. Acute on chronic HF with mid range EF: Echo in 8/25 showed EF 45-50%, severe LVH, abnormal RV free wall strain, ~17%, RV mildly reduced.  Cardiac MRI showed moderate concentric LVH with EF 43%, RV EF 47%, ECV 36%, diffuse patchy LGE throughout LV myocardium. PYP scan negative.  He had an endomyocardial biopsy that was negative for cardiac amyloidosis.  I think that he is most likely to have hypertrophic cardiomyopathy.  He was admitted with GI bleeding, given blood products and IVF and is now volume overloaded and w/ AKI, SCr continues to trend up 4.00>>4.25>>4.49 today, K 4.0. Echo this admit EF 40-45% (previously 45-50%), severe LVH, RV mod reduced systolic function, RVSP 42, dilated IVC estimated RAP ~15 mmHg  - poor response to high dose IV Lasix . Continues w/ progressive rise in SCr now at 4.8  - hold further doses of IV Lasix  for now  - Plan RHC today to further assess filling pressures and cardiac output  - Continue midodrine for now to maintain BP  2. AKI on CKD stage 3b: Renal US  negative. ?ATN in setting of hypotension from GI bleeding.  Creatinine continues to rise today at 4.85  (b/l 2.5-3.0) - nephrology now following, conducting w/u to exclude nephrotic syndrome  - Plan RHC today to r/o low output  - Multiple Myeloma panel 9/25 negative for M-spike protein  3. Atrial fibrillation: Paroxysmal. In rate controlled afib this admit  - Eliquis  on hold with GI bleeding. Per GI can restart tomorrow if no further bleeding  - can plan outpatient DCCV after back on Eliquis  x 3 wks  4. GI  bleeding: Has had transfusions.  EGD with gastritis.  Colonoscopy w/ 2 polyps (resected) and non bleeding hemorrhoids. - no further gross bleeding  - Hgb 7.6>>8.3>>7.1  - resume Eliquis  tomorrow if H/H stable/ no further bleeding.  5. ETOH abuse.  - he reports he quit ~6 wks ago    Length of Stay: [address], [place]  04/23/2024, 8:40 AM  Advanced Heart Failure Team Pager [phone] (M-F; 7a - 5p)  Please contact [facility] for night-coverage after hours (5p -7a ) and weekends on [email or web address]  Patient seen with PA, I formulated the plan and agree with the above note.   UOP 925 cc yesterday with 1 dose of Lasix   120 mg IV.  SBP remains in 90s on midodrine 15 tid. Creatinine 4.49 => 4.82.  He denies overt bleeding but hgb down to 7.1.   RHC today as below: RA 16 RV 33/21 PA 36/17, mean 24 PCWP mean 19 Oxygen saturations: PA 48% AO 95% Cardiac Output (Fick) 6.98  Cardiac Index (Fick) 2.93 PVR < 1 WU  Cardiac Output (Thermo) 4.01  Cardiac Index (Thermo) 1.68  PVR 1.24 WU PAPi 1.2   General: NAD Neck: JVP 14-16 cm, no thyromegaly or thyroid  nodule.  Lungs: Clear to auscultation bilaterally with normal respiratory effort. CV: Nondisplaced PMI.  Heart irregular S1/S2, no S3/S4, no murmur.  1+ edema to knees.  Abdomen: Soft, nontender, no hepatosplenomegaly, no distention.  Skin: Intact without lesions or rashes.  Neurologic: Alert and oriented x 3.  Psych: Normal affect. Extremities: No clubbing or cyanosis.  HEENT: Normal.   RHC appears to show right>left heart failure, low CI by thermo (though preserved by Fick) and low PAPi also suggests RV dysfunction. This is complicated by progressive renal failure and GI bleeding.  - Start dobutamine 2.5 mcg/kg/min for RV support.   - Cannot place PICC with AKI, will arrange CVL with IR, follow CVP and co-ox.  - Lasix  120 mg IV bid, start after dobutamine begun.   - Titrate down midodrine as able.  - Follow BMET closely with  diuresis.   No further overt bleeding but hgb down to 7.1.  Will transfuse 1 unit PRBCs with Lasix .   Ezra Shuck 04/23/2024 11:34 AM

## 2024-04-23 NOTE — Procedures (Signed)
 Central Venous Catheter Insertion Procedure Note  Keith Leblanc  [id number]  Jun 01, 1966  Date:04/23/24  Time:4:13 PM   Provider Performing:Ivonna Kinnick Keith Leblanc   Procedure: Insertion of Non-tunneled Central Venous Catheter(36556) with US  guidance (23062)    Indication(s) Medication administration, RV dysfunction- monitor CVP and Co-ox  Consent Risks of the procedure as well as the alternatives and risks of each were explained to the patient and/or caregiver.  Consent for the procedure was obtained and is signed in the bedside chart  Anesthesia Topical only with 1% lidocaine    Timeout Verified patient identification, verified procedure, site/side was marked, verified correct patient position, special equipment/implants available, medications/allergies/relevant history reviewed, required imaging and test results available.  Sterile Technique Maximal sterile technique including full sterile barrier drape, hand hygiene, sterile gown, sterile gloves, mask, hair covering, sterile ultrasound probe cover (if used).  Procedure Description Area of catheter insertion was cleaned with chlorhexidine  and draped in sterile fashion.  With real-time ultrasound guidance a central venous catheter was placed into the left internal jugular vein. Nonpulsatile blood flow and easy flushing noted in all ports.  The catheter was sutured in place and sterile dressing applied.  Complications/Tolerance None; patient tolerated the procedure well. Chest X-ray is ordered to verify placement for internal jugular or subclavian cannulation.   Chest x-ray is not ordered for femoral cannulation.  EBL Minimal  Specimen(s) None   Keith Leblanc Keith Leblanc, Keith Leblanc [facility] 04/23/24 4:15 PM  Please see [email or web address] for pager details.  From 7A-7P if no response, please call [phone] After hours, please call ELink [phone]

## 2024-04-23 NOTE — Progress Notes (Signed)
 Assisted Rexene Blush PA with central line placement.  Patient tolerated well.  Awaiting PCXR

## 2024-04-23 NOTE — Progress Notes (Signed)
 Keith Leblanc is an 58 y.o. male hypertension, dyslipidemia, T2DM, afib on Eliquis , CHF with grade third diastolic dysfunction/concentric LVH who was presented with symptomatic anemia, acute blood loss anemia seen as a consultation for the evaluation of AKI on CKD. Worsening SOB, DOE, BRBPR. Hypotensive in ED. EGD showed congested friable gastric mucosa likely the source of bleeding and colonoscopy on 10/30 showed hemorrhoids.  He was also managed for acute on chronic CHF exacerbation, EF was around 45% however grade third diastolic dysfunction.  Seen by cardiology team and placed on IV diuretics.    For CKD, patient was seen once at [facility] 2023; recent serum creatinine level fluctuating anywhere around 1.4-2.5. He has hypokalemia, noted albumin level of 2.4. No recent NSAID use and no IV contrast use. Jardiance  which is currently on hold.  He takes furosemide  40 mg at home.  Assessment/Plan: # Acute kidney injury on CKDIIIb-IV ( b/lcr 1.5-2.5): Acute kidney injury likely due to reduced renal perfusion (low effective circulatory volume) caused by persistent hypotension, severe anemia, ongoing use of diuretics for CHF.  Not on ACE inhibitor, ARB and no recent use of IV contrast. Appears to be HRS; hopefully did not develop ATN from hyperperfusion and hypotension.  -Currently on furosemide  120 mg twice daily with dobutamine to be started. SABRA  He does have peripheral edema with low serum albumin level.  - Continue with strict ins and out, daily lab.  -Kidney ultrasound neg for r/o obstruction (he does c/o feeling full after voiding); will check a PVR bladder scan as well - Continue to avoid ACE inhibitor, ARB or SGLT 2 inhibitor.   # Acute blood loss anemia due to GI bleed, symptomatic.  Seen by gastroenterologist and underwent upper and lower GI endoscopy.  Received multiple units of blood transfusion with improvement of hemoglobin.  Back on anticoagulation now. S/p resection of polyps + hemorrhoids but  were not actively bldg - Will check iron panel but should have good store after transfusion; ESA after iron panel.   # Persistent hypotension: Holding evening dose of diuretics.  Not on any antihypertensives.  Agree with midodrine and rx with albumin.   # Acute on chronic CHF with EF of 45 to 50%, diastolic dysfunction: Right heart cath 10/31 tomorrow.  Received a dose of IV Lasix  120 mg today.  Hold further diuretics until right heart cath as discussed above.  I have discussed with Dr. Rolan from HF.   # Metabolic acidosis: CO2 20, trend lab.  Subjective: Denies f/c/n/v/sob; he feels the lower ext edema is much better than previously.   Chemistry and CBC: Creat  Date/Time Value Ref Range Status  10/23/2023 10:05 AM 1.56 (H) 0.70 - 1.30 mg/dL Final  91/95/7978 90:93 AM 1.37 (H) 0.70 - 1.33 mg/dL Final    Comment:    For patients >88 years of age, the reference limit for Creatinine is approximately 13% higher for people identified as African-American. .    Creatinine, Ser  Date/Time Value Ref Range Status  04/23/2024 04:45 AM 4.82 (H) 0.61 - 1.24 mg/dL Final  89/69/7974 95:95 AM 4.49 (H) 0.61 - 1.24 mg/dL Final  89/70/7974 96:80 AM 4.25 (H) 0.61 - 1.24 mg/dL Final  89/71/7974 95:64 AM 4.00 (H) 0.61 - 1.24 mg/dL Final  89/72/7974 95:52 AM 3.97 (H) 0.61 - 1.24 mg/dL Final  89/74/7974 94:95 PM 3.13 (H) 0.61 - 1.24 mg/dL Final  89/98/7974 95:75 PM 2.47 (H) 0.61 - 1.24 mg/dL Final  90/69/7974 88:61 AM 2.42 (H) 0.40 - 1.50 mg/dL  Final  03/18/2024 03:57 AM 2.88 (H) 0.61 - 1.24 mg/dL Final  90/75/7974 94:43 AM 3.05 (H) 0.61 - 1.24 mg/dL Final  90/76/7974 95:63 AM 2.79 (H) 0.61 - 1.24 mg/dL Final  90/77/7974 94:53 AM 3.08 (H) 0.61 - 1.24 mg/dL Final  90/78/7974 96:61 AM 3.16 (H) 0.61 - 1.24 mg/dL Final  90/79/7974 95:81 AM 2.63 (H) 0.61 - 1.24 mg/dL Final  90/80/7974 90:57 AM 2.53 (H) 0.61 - 1.24 mg/dL Final  93/73/7974 90:73 AM 1.46 (H) 0.76 - 1.27 mg/dL Final  89/91/7975 91:48 AM  1.60 (H) 0.76 - 1.27 mg/dL Final  93/74/7975 91:57 AM 1.47 0.40 - 1.50 mg/dL Final  94/77/7975 88:99 AM 1.74 (H) 0.40 - 1.50 mg/dL Final  94/80/7975 96:78 AM 1.29 (H) 0.61 - 1.24 mg/dL Final  94/81/7975 96:88 PM 1.37 (H) 0.61 - 1.24 mg/dL Final  94/81/7975 87:54 AM 1.54 (H) 0.61 - 1.24 mg/dL Final  94/82/7975 89:47 PM 1.80 (H) 0.61 - 1.24 mg/dL Final  94/82/7975 89:48 PM 1.66 (H) 0.61 - 1.24 mg/dL Final  96/72/7976 87:41 PM 1.60 (H) 0.40 - 1.50 mg/dL Final  96/77/7976 89:92 AM 1.75 (H) 0.40 - 1.50 mg/dL Final  96/97/7977 90:65 AM 1.41 0.40 - 1.50 mg/dL Final  98/79/7977 97:50 PM 1.35 (H) 0.76 - 1.27 mg/dL Final  90/98/7978 88:53 AM 1.27 0.76 - 1.27 mg/dL Final  87/96/7979 96:86 PM 1.25 0.40 - 1.50 mg/dL Final  87/73/7980 97:82 PM 1.32 (H) 0.61 - 1.24 mg/dL Final  89/88/7980 91:71 AM 1.34 0.40 - 1.50 mg/dL Final  88/84/7981 89:99 AM 1.17 0.40 - 1.50 mg/dL Final  98/76/7981 97:50 PM 1.11 0.40 - 1.50 mg/dL Final  98/83/7981 93:64 AM 1.18 0.61 - 1.24 mg/dL Final  98/84/7981 91:86 AM 1.74 (H) 0.61 - 1.24 mg/dL Final  98/84/7981 87:45 AM 1.78 (H) 0.61 - 1.24 mg/dL Final  98/85/7981 90:80 PM 1.85 (H) 0.61 - 1.24 mg/dL Final  98/85/7981 93:94 PM 2.31 (H) 0.61 - 1.24 mg/dL Final  98/85/7981 89:40 AM 2.76 (H) 0.61 - 1.24 mg/dL Final  88/71/7982 88:88 AM 1.18 0.40 - 1.50 mg/dL Final  87/83/7983 90:63 AM 1.15 0.40 - 1.50 mg/dL Final   Recent Labs  Lab 04/17/24 1704 04/19/24 0447 04/20/24 0435 04/21/24 0319 04/22/24 0404 04/23/24 0445  NA 137 138 138 135 139 138  K 4.4 4.3 3.7 3.8 4.0 3.6  CL 105 105 107 102 102 102  CO2 21* 21* 19* 18* 20* 18*  GLUCOSE 174* 150* 155* 142* 157* 133*  BUN 57* 73* 76* 77* 71* 70*  CREATININE 3.13* 3.97* 4.00* 4.25* 4.49* 4.82*  CALCIUM  9.1 8.8* 8.6* 8.9 8.8* 8.9  PHOS  --  6.2*  --   --   --   --    Recent Labs  Lab 04/20/24 0435 04/21/24 0319 04/22/24 0404 04/23/24 0445  WBC 7.9 7.9 7.5 7.4  HGB 7.0* 7.6* 8.3* 7.1*  HCT 21.9* 23.7* 26.0* 22.9*   MCV 84.9 84.3 84.1 83.9  PLT 410* 396 453* 362   Liver Function Tests: Recent Labs  Lab 04/18/24 2343 04/19/24 0447 04/23/24 0445  AST 71* 73*  --   ALT 91* 102*  --   ALKPHOS 104 92  --   BILITOT 1.2 1.0  --   PROT 5.6* 5.3*  --   ALBUMIN 2.7* 2.4* 2.9*   No results for input(s): LIPASE, AMYLASE in the last 168 hours. No results for input(s): AMMONIA in the last 168 hours. Cardiac Enzymes: No results for input(s): CKTOTAL, CKMB, CKMBINDEX, TROPONINI in  the last 168 hours. Iron Studies: No results for input(s): IRON, TIBC, TRANSFERRIN, FERRITIN in the last 72 hours. PT/INR: @LABRCNTIP (inr:5)  Xrays/Other Studies: ) Results for orders placed or performed during the hospital encounter of 04/17/24 (from the past 48 hours)  Basic metabolic panel with GFR     Status: Abnormal   Collection Time: 04/22/24  4:04 AM  Result Value Ref Range   Sodium 139 135 - 145 mmol/L   Potassium 4.0 3.5 - 5.1 mmol/L   Chloride 102 98 - 111 mmol/L   CO2 20 (L) 22 - 32 mmol/L   Glucose, Bld 157 (H) 70 - 99 mg/dL    Comment: Glucose reference range applies only to samples taken after fasting for at least 8 hours.   BUN 71 (H) 6 - 20 mg/dL   Creatinine, Ser 5.50 (H) 0.61 - 1.24 mg/dL   Calcium  8.8 (L) 8.9 - 10.3 mg/dL   GFR, Estimated 15 (L) >60 mL/min    Comment: (NOTE) Calculated using the CKD-EPI Creatinine Equation (2021)    Anion gap 17 (H) 5 - 15    Comment: Performed at [facility], [address] [place], [place] [address]  CBC     Status: Abnormal   Collection Time: 04/22/24  4:04 AM  Result Value Ref Range   WBC 7.5 4.0 - 10.5 K/uL   RBC 3.09 (L) 4.22 - 5.81 MIL/uL   Hemoglobin 8.3 (L) 13.0 - 17.0 g/dL   HCT 73.9 (L) 60.9 - 47.9 %   MCV 84.1 80.0 - 100.0 fL   MCH 26.9 26.0 - 34.0 pg   MCHC 31.9 30.0 - 36.0 g/dL   RDW 84.0 (H) 88.4 - 84.4 %   Platelets 453 (H) 150 - 400 K/uL   nRBC 1.7 (H) 0.0 - 0.2 %    Comment: Performed at [facility]  Lab, [address] [place], [place] [address]  Glucose, capillary     Status: Abnormal   Collection Time: 04/22/24  7:44 AM  Result Value Ref Range   Glucose-Capillary 126 (H) 70 - 99 mg/dL    Comment: Glucose reference range applies only to samples taken after fasting for at least 8 hours.  Glucose, capillary     Status: Abnormal   Collection Time: 04/22/24 11:22 AM  Result Value Ref Range   Glucose-Capillary 151 (H) 70 - 99 mg/dL    Comment: Glucose reference range applies only to samples taken after fasting for at least 8 hours.  Urinalysis, Routine w reflex microscopic -Urine, Clean Catch     Status: Abnormal   Collection Time: 04/22/24  2:46 PM  Result Value Ref Range   Color, Urine YELLOW YELLOW   APPearance CLOUDY (A) CLEAR   Specific Gravity, Urine 1.008 1.005 - 1.030   pH 5.0 5.0 - 8.0   Glucose, UA NEGATIVE NEGATIVE mg/dL   Hgb urine dipstick MODERATE (A) NEGATIVE   Bilirubin Urine NEGATIVE NEGATIVE   Ketones, ur NEGATIVE NEGATIVE mg/dL   Protein, ur 30 (A) NEGATIVE mg/dL   Nitrite NEGATIVE NEGATIVE   Leukocytes,Ua NEGATIVE NEGATIVE   RBC / HPF 0-5 0 - 5 RBC/hpf   WBC, UA 0-5 0 - 5 WBC/hpf   Bacteria, UA RARE (A) NONE SEEN   Squamous Epithelial / HPF 0-5 0 - 5 /HPF   Mucus PRESENT    Hyaline Casts, UA PRESENT    Amorphous Crystal PRESENT     Comment: Performed at [facility], [address],  [place], [place] [address]  Protein / creatinine ratio, urine     Status: Abnormal   Collection Time: 04/22/24  2:46 PM  Result Value Ref Range   Creatinine, Urine 44 mg/dL   Total Protein, Urine 62 mg/dL    Comment: NO NORMAL RANGE ESTABLISHED FOR THIS TEST   Protein Creatinine Ratio 1.41 (H) 0.00 - 0.15 mg/mg[Cre]    Comment: Performed at [facility], [address], [place], [place] [address]  Glucose, capillary     Status: Abnormal   Collection Time: 04/22/24  4:47 PM  Result Value Ref Range   Glucose-Capillary 160 (H) 70 - 99 mg/dL    Comment: Glucose  reference range applies only to samples taken after fasting for at least 8 hours.  Glucose, capillary     Status: Abnormal   Collection Time: 04/22/24  9:47 PM  Result Value Ref Range   Glucose-Capillary 151 (H) 70 - 99 mg/dL    Comment: Glucose reference range applies only to samples taken after fasting for at least 8 hours.  Basic metabolic panel with GFR     Status: Abnormal   Collection Time: 04/23/24  4:45 AM  Result Value Ref Range   Sodium 138 135 - 145 mmol/L   Potassium 3.6 3.5 - 5.1 mmol/L   Chloride 102 98 - 111 mmol/L   CO2 18 (L) 22 - 32 mmol/L   Glucose, Bld 133 (H) 70 - 99 mg/dL    Comment: Glucose reference range applies only to samples taken after fasting for at least 8 hours.   BUN 70 (H) 6 - 20 mg/dL   Creatinine, Ser 5.17 (H) 0.61 - 1.24 mg/dL   Calcium  8.9 8.9 - 10.3 mg/dL   GFR, Estimated 13 (L) >60 mL/min    Comment: (NOTE) Calculated using the CKD-EPI Creatinine Equation (2021)    Anion gap 18 (H) 5 - 15    Comment: Performed at [facility], [address], [place], [place] [address]  CBC     Status: Abnormal   Collection Time: 04/23/24  4:45 AM  Result Value Ref Range   WBC 7.4 4.0 - 10.5 K/uL   RBC 2.73 (L) 4.22 - 5.81 MIL/uL   Hemoglobin 7.1 (L) 13.0 - 17.0 g/dL   HCT 77.0 (L) 60.9 - 47.9 %   MCV 83.9 80.0 - 100.0 fL   MCH 26.0 26.0 - 34.0 pg   MCHC 31.0 30.0 - 36.0 g/dL   RDW 84.0 (H) 88.4 - 84.4 %   Platelets 362 150 - 400 K/uL   nRBC 0.9 (H) 0.0 - 0.2 %    Comment: Performed at [facility], [address], [place], [place] [address]  Magnesium      Status: None   Collection Time: 04/23/24  4:45 AM  Result Value Ref Range   Magnesium  2.1 1.7 - 2.4 mg/dL    Comment: Performed at [facility], [address], [place], [place] [address]  Albumin     Status: Abnormal   Collection Time: 04/23/24  4:45 AM  Result Value Ref Range   Albumin 2.9 (L) 3.5 - 5.0 g/dL    Comment: Performed at [facility], [address],  [place], [place] [address]   CARDIAC CATHETERIZATION Result Date: 04/23/2024 1. Elevated right-sided filling pressures out of proportion to mildly elevated PCWP. 2. Mild pulmonary venous hypertension 3. Low cardiac output by thermo though preserved by Fick. 4. Low PAPi Suspect primarily RV dysfunction.  Will start dobutamine 2.5 for RV support.   US  RENAL Result Date: 04/22/2024 CLINICAL DATA:  Acute renal insufficiency. EXAM: RENAL / URINARY TRACT ULTRASOUND COMPLETE COMPARISON:  12/11/2021 FINDINGS: Right Kidney: Renal measurements: 12.0 x 4.6 x 6.0 cm = volume: 174 mL. Echogenicity within normal limits. No mass or hydronephrosis visualized. Left Kidney: Renal measurements: 10.5 x 5.9 x 5.7 cm = volume: 186 mL. Echogenicity within normal limits. No mass or hydronephrosis visualized. Bladder: Appears normal for degree of bladder distention. Bilateral ureteral jets visualized. Other: Small right pleural effusion.  Tiny amount of perihepatic fluid. IMPRESSION: 1. Normal kidneys and bladder. 2. Small right pleural effusion. Tiny amount of perihepatic fluid. Electronically Signed   By: Toribio Agreste M.D.   On: 04/22/2024 16:39   ECHOCARDIOGRAM COMPLETE Result Date: 04/21/2024    ECHOCARDIOGRAM REPORT   Patient Name:   Keith Leblanc Date of Exam: 04/21/2024 Medical Rec #:  [id number]       Height:       77.0 in Accession #:    [id number]      Weight:       232.0 lb Date of Birth:  05/18/1966       BSA:          2.382 m Patient Age:    58 years        BP:           87/70 mmHg Patient Gender: M               HR:           50 bpm. Exam Location:  Inpatient Procedure: 2D Echo, Cardiac Doppler and Color Doppler (Both Spectral and Color            Flow Doppler were utilized during procedure). Indications:    Congestive Heart Failure  History:        Patient has prior history of Echocardiogram examinations, most                 recent 03/16/2024. CHF, Stroke, Signs/Symptoms:Shortness of                 Breath and  Dyspnea; Risk Factors:Former Smoker, Hypertension,                 Diabetes and Dyslipidemia.  Sonographer:    VALENTE, ADAM Referring Phys: [id number] BRITTAINY M SIMMONS IMPRESSIONS  1. Left ventricular ejection fraction, by estimation, is 40 to 45%. Left ventricular ejection fraction by PLAX is 43 %. The left ventricle has mildly decreased function. The left ventricle demonstrates global hypokinesis. There is severe left ventricular hypertrophy. Left ventricular diastolic parameters are consistent with Grade III diastolic dysfunction (restrictive). Elevated left ventricular end-diastolic pressure. Avg E' 4.6 cm/s.  2. Right ventricular systolic function is moderately reduced. The right ventricular size is mildly enlarged. Mildly increased right ventricular wall thickness. There is mildly elevated pulmonary artery systolic pressure. The estimated right ventricular systolic pressure is 41.6 mmHg.  3. Left atrial size was mildly dilated.  4. The mitral valve is grossly normal. Trivial mitral valve regurgitation.  5. The aortic valve is tricuspid. Aortic valve regurgitation is not visualized.  6. The inferior vena cava is dilated in size with <50% respiratory variability, suggesting right atrial pressure of 15 mmHg. Comparison(s): Changes from prior study are noted. 02/25/2024: LVEF 55-60%. FINDINGS  Left Ventricle: Left ventricular ejection fraction, by estimation, is 40 to 45%. Left ventricular ejection fraction by PLAX is 43 %. The left ventricle  has mildly decreased function. The left ventricle demonstrates global hypokinesis. The left ventricular internal cavity size was normal in size. There is severe left ventricular hypertrophy. Left ventricular diastolic parameters are consistent with Grade III diastolic dysfunction (restrictive). Elevated left ventricular end-diastolic pressure. Right Ventricle: The right ventricular size is mildly enlarged. Mildly increased right ventricular wall thickness. Right ventricular  systolic function is moderately reduced. There is mildly elevated pulmonary artery systolic pressure. The tricuspid regurgitant velocity is 2.58 m/s, and with an assumed right atrial pressure of 15 mmHg, the estimated right ventricular systolic pressure is 41.6 mmHg. Left Atrium: Left atrial size was mildly dilated. Right Atrium: Right atrial size was normal in size. Pericardium: There is no evidence of pericardial effusion. Mitral Valve: The mitral valve is grossly normal. Trivial mitral valve regurgitation. Tricuspid Valve: The tricuspid valve is grossly normal. Tricuspid valve regurgitation is mild. Aortic Valve: The aortic valve is tricuspid. Aortic valve regurgitation is not visualized. Pulmonic Valve: The pulmonic valve was grossly normal. Pulmonic valve regurgitation is trivial. Aorta: The aortic root and ascending aorta are structurally normal, with no evidence of dilitation. Venous: The inferior vena cava is dilated in size with less than 50% respiratory variability, suggesting right atrial pressure of 15 mmHg. IAS/Shunts: No atrial level shunt detected by color flow Doppler.  LEFT VENTRICLE PLAX 2D LV EF:         Left            Diastology                ventricular     LV e' medial:  4.54 cm/s                ejection        LV e' lateral: 4.78 cm/s                fraction by                PLAX is 43                %. LVIDd:         4.30 cm LVIDs:         3.40 cm LV PW:         1.60 cm LV IVS:        1.30 cm LVOT diam:     2.20 cm LV SV:         65 LV SV Index:   27 LVOT Area:     3.80 cm  RIGHT VENTRICLE          IVC RV Basal diam:  4.40 cm  IVC diam: 2.10 cm RV Mid diam:    4.00 cm LEFT ATRIUM             Index        RIGHT ATRIUM           Index LA diam:        4.60 cm 1.93 cm/m   RA Area:     16.60 cm LA Vol (A2C):   75.3 ml 31.61 ml/m  RA Volume:   46.20 ml  19.39 ml/m LA Vol (A4C):   67.3 ml 28.25 ml/m LA Biplane Vol: 78.1 ml 32.78 ml/m  AORTIC VALVE LVOT Vmax:   96.00 cm/s LVOT Vmean:   60.800 cm/s LVOT VTI:    0.172 m  AORTA Ao Root diam: 3.40 cm TRICUSPID VALVE TR Peak grad:   26.6 mmHg TR Vmax:  258.00 cm/s  SHUNTS Systemic VTI:  0.17 m Systemic Diam: 2.20 cm Vinie Maxcy MD Electronically signed by Vinie Maxcy MD Signature Date/Time: 04/21/2024/5:05:22 PM    Final     PMH:   Past Medical History:  Diagnosis Date   Blood in stool    bright red blood    Chicken pox    Diabetes mellitus (HCC)    Erectile dysfunction    Gout    Hypertension    Stroke [facility])     PSH:   Past Surgical History:  Procedure Laterality Date   APPENDECTOMY  2004   COLONOSCOPY     COLONOSCOPY N/A 04/22/2024   Procedure: COLONOSCOPY;  Surgeon: Stacia Glendia BRAVO, MD;  Location: [facility] ENDOSCOPY;  Service: Gastroenterology;  Laterality: N/A;   ENDOMYOCARDIAL BIOPSY N/A 03/16/2024   Procedure: ENDOMYOCARDIAL BIOPSY;  Surgeon: Zenaida Morene PARAS, MD;  Location[facility] INVASIVE CV LAB;  Service: Cardiovascular;  Laterality: N/A;   ESOPHAGOGASTRODUODENOSCOPY N/A 04/19/2024   Procedure: EGD (ESOPHAGOGASTRODUODENOSCOPY);  Surgeon: Stacia Glendia BRAVO, MD;  Location: [facility] ENDOSCOPY;  Service: Gastroenterology;  Laterality: N/A;   RIGHT HEART CATH N/A 03/16/2024   Procedure: RIGHT HEART CATH;  Surgeon: Zenaida Morene PARAS, MD;  Location: [facility] INVASIVE CV LAB;  Service: Cardiovascular;  Laterality: N/A;   TOOTH EXTRACTION  07/06/2020    Allergies: No Known Allergies  Medications:   Prior to Admission medications   Medication Sig Start Date End Date Taking? Authorizing Provider  apixaban  (ELIQUIS ) 5 MG TABS tablet Take 1 tablet (5 mg total) by mouth 2 (two) times daily. 04/08/24  Yes Hayes Beckey CROME, NP  bisoprolol  (ZEBETA ) 10 MG tablet Take 2 tablets (20 mg total) by mouth daily. 04/08/24  Yes Hayes Beckey CROME, NP  buPROPion  (WELLBUTRIN  XL) 300 MG 24 hr tablet TAKE 1 TABLET BY MOUTH ONCE  DAILY 04/13/24  Yes Nafziger, Darleene, NP  colchicine  0.6 MG tablet TAKE 1 TABLET BY MOUTH DAILY  WHEN HAVING FLARES TAKE 1  TABLET BY MOUTH TWICE DAILY Patient taking differently: Take 0.6 mg by mouth as needed. TAKE 1 TABLET BY MOUTH  DAILY WHEN HAVING FLARES  TAKE 1 TABLET BY MOUTH  TWICE DAILY 09/05/23  Yes Nafziger, Darleene, NP  empagliflozin  (JARDIANCE ) 10 MG TABS tablet Take 1 tablet (10 mg total) by mouth daily. 04/08/24  Yes Hayes Beckey CROME, NP  furosemide  (LASIX ) 20 MG tablet Take 2 tablets (40 mg total) by mouth as needed (For increased swelling, weight gain of 3 LB in 1 day or 5 LB in 1 week). 04/08/24  Yes Hayes Beckey CROME, NP  hydrOXYzine (VISTARIL) 50 MG capsule Take 1 capsule (50 mg total) by mouth every 8 (eight) hours as needed. Patient taking differently: Take 50 mg by mouth every 8 (eight) hours as needed for itching. 03/23/24  Yes Nafziger, Darleene, NP  magnesium  oxide (MAG-OX) 400 (240 Mg) MG tablet TAKE 1 TABLET(400 MG) BY MOUTH DAILY 02/12/24  Yes West, Katlyn D, NP  Multiple Vitamins-Minerals (ONE-A-DAY MENS 50+) TABS Take 1 tablet by mouth daily with breakfast.   Yes [provider]  omeprazole  (PRILOSEC) 40 MG capsule TAKE 1 CAPSULE BY MOUTH DAILY Patient taking differently: Take 40 mg by mouth daily before breakfast. 11/12/23  Yes Nafziger, Darleene, NP  potassium chloride  SA (KLOR-CON  M) 20 MEQ tablet Take 2 tablets (40 mEq total) by mouth daily as needed (take with lasix  as needed dose). 04/08/24  Yes Hayes Beckey CROME, NP  rosuvastatin  (CRESTOR ) 20 MG tablet Take 1 tablet (20  mg total) by mouth daily. 04/08/24  Yes Hayes Beckey CROME, NP  thiamine  (VITAMIN B-1) 100 MG tablet Take 1 tablet (100 mg total) by mouth daily. 11/11/22  Yes Toberman, Stevi W, NP  triamcinolone cream (KENALOG) 0.5 % Apply 1 Application topically 2 (two) times daily. 03/23/24  Yes Nafziger, Darleene, NP  blood glucose meter kit and supplies Dispense based on patient and insurance preference. Use up to four times daily as directed. (FOR ICD-9 250.00, 250.01). 07/09/16   Sebastian Toribio GAILS, MD  glucose blood (ONETOUCH ULTRA TEST) test strip Use to  check blood sugar 2-3 times a day. 03/01/24   Trixie File, MD  Insulin  Pen Needle (B-D UF III MINI PEN NEEDLES) 31G X 5 MM MISC USE 4 TIMES DAILY AFTER MEALS AND AT BEDTIME 02/24/20   Trixie File, MD  Lancets [facility] ULTRASOFT) lancets Test blood sugars as directed. Dx E11.9 08/06/16   Nafziger, Cory, NP  nicotine  (NICODERM CQ  - DOSED IN MG/24 HR) 7 mg/24hr patch Place 1 patch (7 mg total) onto the skin daily. Patient not taking: Reported on 04/17/2024 03/19/24   Fairy Frames, MD  potassium chloride  (KLOR-CON ) 10 MEQ tablet TAKE 1 TABLET(10 MEQ) BY MOUTH DAILY Patient not taking: Reported on 04/17/2024 04/08/24   Hayes Beckey CROME, NP    Discontinued Meds:   Medications Discontinued During This Encounter  Medication Reason   CLEAR EYES NATURAL TEARS 5-6 MG/ML SOLN No longer needed (for PRN medications)   allopurinol  (ZYLOPRIM ) 300 MG tablet Patient Preference   apixaban  (ELIQUIS ) tablet 5 mg    midodrine (PROAMATINE) tablet 10 mg    pantoprazole  (PROTONIX ) injection 40 mg P&T Policy: IV to PO Conversion   Na Sulfate-K Sulfate-Mg Sulfate concentrate (SUPREP) kit 177 mL    Na Sulfate-K Sulfate-Mg Sulfate concentrate (SUPREP) kit 177 mL    simethicone  (MYLICON) chewable tablet 240 mg    simethicone  (MYLICON) chewable tablet 240 mg    sodium chloride  0.9 % bolus 500 mL    midodrine (PROAMATINE) tablet 10 mg    furosemide  (LASIX ) 120 mg in dextrose  5 % 50 mL IVPB    DOBUTamine (DOBUTREX) infusion 4000 mcg/mL    furosemide  (LASIX ) 120 mg in dextrose  5 % 50 mL IVPB     Social History:  reports that he quit smoking about 6 weeks ago. His smoking use included cigarettes and cigars. He has never used smokeless tobacco. He reports that he does not currently use alcohol. He reports that he does not currently use drugs after having used the following drugs: Marijuana.  Family History:   Family History  Problem Relation Age of Onset   Alcohol abuse Father    Hypertension Father    Heart  disease Father    Gout Father    Breast cancer Mother    Hypertension Mother    Gout Mother    Healthy Sister    Healthy Daughter    Colon cancer Neg Hx    Esophageal cancer Neg Hx    Rectal cancer Neg Hx    Stomach cancer Neg Hx     Blood pressure 104/69, pulse 65, temperature 98.4 F (36.9 C), temperature source Oral, resp. rate (!) 23, height 6' 5 (1.956 m), weight 105.2 kg, SpO2 98%. Physical Exam: General exam: Appears calm and comfortable  Respiratory system: CTA b/l, no rales Cardiovascular system: S1 & S2 heard, RRR.   Gastrointestinal system: Abdomen is nondistended, soft and nontender. Normal bowel sounds heard. Central nervous system: Alert and oriented.  No focal neurological deficits. Extremities: LE edema + Skin: No rashes, lesions or ulcers     Keith Leblanc, LYNWOOD ORN, MD 04/23/2024, 12:23 PM

## 2024-04-23 NOTE — Plan of Care (Signed)
  Problem: Clinical Measurements: Goal: Ability to maintain clinical measurements within normal limits will improve Outcome: Progressing   Problem: Nutrition: Goal: Adequate nutrition will be maintained Outcome: Progressing   Problem: Pain Managment: Goal: General experience of comfort will improve and/or be controlled Outcome: Progressing   Problem: Fluid Volume: Goal: Ability to maintain a balanced intake and output will improve Outcome: Progressing   Problem: Metabolic: Goal: Ability to maintain appropriate glucose levels will improve Outcome: Progressing

## 2024-04-23 NOTE — Plan of Care (Signed)
 Per team, tunneled CVC placement is no longer needed, OK to be d/c'd.   Tunneled CVC placement order was canceled.   Please call IR for questions and concerns.    Theus Espin H Rudransh Bellanca PA-C 04/23/2024 5:02 PM

## 2024-04-23 NOTE — Progress Notes (Signed)
 PROGRESS NOTE Keith Leblanc  FMW:[id number] DOB: 03-Apr-1966 DOA: 04/17/2024 PCP: [name], NP  Brief Narrative/Hospital Course: Keith Leblanc is a 58 y.o. male with PMH of of prior stroke, hypertension, diabetes who presented to the ED due to shortness of breath w/ progressive worsening over the last week and noticed bright red blood in stool , he in ED BP soft, labs showed bicarb 21, creatinine 3.1 baseline around 3, WBC 8.0, hemoglobin 7.5 baseline around 12, troponin 59, 54, INR 1.6.   CXR>>no acute changes.GI was consulted and started on IV PPI BID.  S/P 1 unit of packed RBCs for Hg 6.8. His Eliquis  was held. Last colonoscopy in 2019 which showed diverticulosis and multiple polyps.  Patient endorsed improving shortness of breath following transfusion no chest pain. Underwent EGD 10/27 has congested friable gastric mucosa likely the source of bleeding  10/30 colonoscopy showed hemorrhoids likely cause of patient's blood in the stool  Subjective: Seen and examined Patient resting comfortably no distress nausea vomiting. Wife at the bedside.  Last BM 8/30 was clear he says. Overnight afebrile BP ranging 80s-100, on room air afebrile Labs reviewed creatinine further up 4.8 bicarb down 18 hemoglobin down 7.1  Assessment and plan:  ABLA due to upper GI bleed with friable congested gastric mucosa Hematochezia due to internal hemorrhoids:  history of LA grade a reflux esophagitis and gastritis seen on EGD done in 2019. S/p EGD 10/27: congested friable gastric mucosa likely the source of bleeding.  S/p Colonoscopy 10/30:3 to 5 mm polyps in transverse colon removed, nonbleeding internal hemorrhoids noticed likely cause of hematochezia but doubt it accounts for patient's drop in hemoglobin Continue PPI twice daily, holding Eliquis  for now Monitor hemoglobin which is downtrending-he will benefit with further transfusion - will recheck h/h Recent Labs    04/19/24 2314 04/20/24 0435  04/21/24 0319 04/22/24 0404 04/23/24 0445  HGB 7.1* 7.0* 7.6* 8.3* 7.1*  MCV  --  84.9 84.3 84.1 83.9   Acute on chronic CHF with midrange EF 43% HCM Hypertension history Hypotension-persistent: Prior to admission on GDMT. Placed on midodrine, GDMT held, midodrine dose increased to 15mg  tid Continue to hold antihypertensives and GDMT-advanced heart failure team following repeat TTE ordered but showed EF 40-45% global SMK severe left ventricular hypertrophy G3 DD.  AHF team following now on high-dose Lasix  and plan is for RHC 10/31 Cont to monitor daily I/O,weight, electrolytes and net balance as below.Keep on  salt/fluid restricted diet and monitor in tele.Net IO Since Admission: 3,762.74 mL [04/23/24 1101]  Filed Weights   04/19/24 0910 04/23/24 0500  Weight: 105.2 kg 105.2 kg    AKI on CKD stage V Metabolic acidosis: Creatinine has been fluctuating b/l 1.5-2.5.Suspect component of ATN/AKI in the setting of GI bleeding, hypotension, CHF Creatinine has been uptrending-patient has received IV fluids along with increased dose of midodrine and GDMT remains on hold due to hypotension. On high-dose Lasix  per advanced heart failure team, nephrology has been consulted-agree with plan for RHC Continue diuresis/Iv albumin as per nephrology Monitor urine output, renal function as below Recent Labs    03/17/24 0556 03/18/24 0357 03/23/24 1138 03/24/24 1624 04/17/24 1704 04/19/24 0447 04/20/24 0435 04/21/24 0319 04/22/24 0404 04/23/24 0445  BUN 20 24* 27* 31* 57* 73* 76* 77* 71* 70*  CREATININE 3.05* 2.88* 2.42* 2.47* 3.13* 3.97* 4.00* 4.25* 4.49* 4.82*  CO2 32 28 30 25  21* 21* 19* 18* 20* 18*  K 4.1 4.7 4.4 4.3 4.4 4.3 3.7 3.8 4.0 3.6  Intake/Output Summary (Last 24 hours) at 04/23/2024 1101 Last data filed at 04/23/2024 0300 Gross per 24 hour  Intake 1081.07 ml  Output 925 ml  Net 156.07 ml    Chronic depression: Mood stable, continue home bupropion    Elevated  troponin: Due to demand ischemia.  Plan per cardiology  Hyperlipidemia: continue home rosuvastatin    Persistent A-fib: Eliquis  on hold due to ongoing anemia.  DVT prophylaxis: Place TED hose Start: 04/21/24 1237 SCDs Start: 04/17/24 2333 Code Status:   Code Status: Full Code Family Communication: plan of care discussed with patient and his siter was updated at bedside again Patient status is: Remains hospitalized because of severity of illness Level of care: Telemetry transfer to telemetry  Dispo: The patient is from: HOME alone.            Anticipated disposition: TBD Objective: Vitals last 24 hrs: Vitals:   04/23/24 0355 04/23/24 0500 04/23/24 0732 04/23/24 1025  BP: (!) 88/59  90/63   Pulse: (!) 59  63   Resp: 16  20   Temp: 98.1 F (36.7 C)  98.4 F (36.9 C)   TempSrc: Oral  Oral   SpO2: 100%  100% 100%  Weight:  105.2 kg    Height:        Physical Examination: General exam: AAOX3 HEENT:Oral mucosa moist, Ear/Nose WNL grossly Respiratory system: B/L Clear,no use of accessory muscle Cardiovascular system: S1 & S2 +, No JVD. Gastrointestinal system: Abdomen soft,NT,ND, BS+ Nervous System: Alert, awake, moving all extremities,and following commands. Extremities: extremities warm, leg edema  mild Skin: Warm, no rashes MSK: Normal muscle bulk,tone, power   Medications reviewed:  Scheduled Meds:  [MAR Hold] buPROPion   300 mg Oral Daily   [MAR Hold] insulin  aspart  0-9 Units Subcutaneous TID WC   [MAR Hold] midodrine  15 mg Oral TID WC   [MAR Hold] pantoprazole   40 mg Oral BID   [MAR Hold] rosuvastatin   20 mg Oral Daily   [MAR Hold] simethicone   240 mg Oral Once  Continuous Infusions: Diet: Diet Order             Diet NPO time specified  Diet effective midnight                    Data Reviewed: I have personally reviewed following labs and imaging studies ( see epic result tab) CBC: Recent Labs  Lab 04/19/24 0447 04/19/24 2314 04/20/24 0435  04/21/24 0319 04/22/24 0404 04/23/24 0445  WBC 7.3  --  7.9 7.9 7.5 7.4  HGB 7.4* 7.1* 7.0* 7.6* 8.3* 7.1*  HCT 23.8* 22.5* 21.9* 23.7* 26.0* 22.9*  MCV 86.9  --  84.9 84.3 84.1 83.9  PLT 404*  --  410* 396 453* 362   CMP: Recent Labs  Lab 04/19/24 0447 04/20/24 0435 04/21/24 0319 04/22/24 0404 04/23/24 0445  NA 138 138 135 139 138  K 4.3 3.7 3.8 4.0 3.6  CL 105 107 102 102 102  CO2 21* 19* 18* 20* 18*  GLUCOSE 150* 155* 142* 157* 133*  BUN 73* 76* 77* 71* 70*  CREATININE 3.97* 4.00* 4.25* 4.49* 4.82*  CALCIUM  8.8* 8.6* 8.9 8.8* 8.9  MG 2.3  --   --   --  2.1  PHOS 6.2*  --   --   --   --    GFR: Estimated Creatinine Clearance: 21.6 mL/min (A) (by C-G formula based on SCr of 4.82 mg/dL (H)). Recent Labs  Lab 04/18/24 2343 04/19/24 0447  04/23/24 0445  AST 71* 73*  --   ALT 91* 102*  --   ALKPHOS 104 92  --   BILITOT 1.2 1.0  --   PROT 5.6* 5.3*  --   ALBUMIN 2.7* 2.4* 2.9*   No results for input(s): LIPASE, AMYLASE in the last 168 hours. No results for input(s): AMMONIA in the last 168 hours. Coagulation Profile:  Recent Labs  Lab 04/17/24 2253  INR 1.6*   Unresulted Labs (From admission, onward)     Start     Ordered   04/23/24 1230  Hemoglobin and hematocrit, blood  Once,   R       Question:  Specimen collection method  Answer:  Lab=Lab collect   04/23/24 1102   04/22/24 0500  Basic metabolic panel with GFR  Daily,   R     Question:  Specimen collection method  Answer:  Lab=Lab collect   04/21/24 1022   04/22/24 0500  CBC  Daily,   R     Question:  Specimen collection method  Answer:  Lab=Lab collect   04/21/24 1022           Antimicrobials/Microbiology: Anti-infectives (From admission, onward)    None         Component Value Date/Time   SDES BLOOD LEFT ANTECUBITAL 07/07/2016 1225   SPECREQUEST BOTTLES DRAWN AEROBIC AND ANAEROBIC 5CC 07/07/2016 1225   CULT  07/07/2016 1225    NO GROWTH 5 DAYS Performed at [facility],  [address], [place], [place] [address]    REPTSTATUS 07/12/2016 FINAL 07/07/2016 1225    Procedures: Procedure(s) (LRB): RIGHT HEART CATH (N/A)   Mennie LAMY, MD [facility] 04/23/2024, 11:02 AM

## 2024-04-24 ENCOUNTER — Encounter (HOSPITAL_COMMUNITY): Payer: Self-pay | Admitting: Cardiology

## 2024-04-24 DIAGNOSIS — K922 Gastrointestinal hemorrhage, unspecified: Secondary | ICD-10-CM | POA: Diagnosis not present

## 2024-04-24 DIAGNOSIS — I5021 Acute systolic (congestive) heart failure: Secondary | ICD-10-CM | POA: Diagnosis not present

## 2024-04-24 LAB — FERRITIN: Ferritin: 14 ng/mL — ABNORMAL LOW (ref 24–336)

## 2024-04-24 LAB — GLUCOSE, CAPILLARY
Glucose-Capillary: 124 mg/dL — ABNORMAL HIGH (ref 70–99)
Glucose-Capillary: 146 mg/dL — ABNORMAL HIGH (ref 70–99)
Glucose-Capillary: 159 mg/dL — ABNORMAL HIGH (ref 70–99)
Glucose-Capillary: 95 mg/dL (ref 70–99)
Glucose-Capillary: 183 mg/dL — ABNORMAL HIGH (ref 70–99)

## 2024-04-24 LAB — BASIC METABOLIC PANEL WITH GFR
Anion gap: 17 — ABNORMAL HIGH (ref 5–15)
BUN: 67 mg/dL — ABNORMAL HIGH (ref 6–20)
CO2: 20 mmol/L — ABNORMAL LOW (ref 22–32)
Calcium: 9.1 mg/dL (ref 8.9–10.3)
Chloride: 103 mmol/L (ref 98–111)
Creatinine, Ser: 5.08 mg/dL — ABNORMAL HIGH (ref 0.61–1.24)
GFR, Estimated: 13 mL/min — ABNORMAL LOW (ref >60)
Glucose, Bld: 139 mg/dL — ABNORMAL HIGH (ref 70–99)
Potassium: 3.4 mmol/L — ABNORMAL LOW (ref 3.5–5.1)
Sodium: 140 mmol/L (ref 135–145)

## 2024-04-24 LAB — CBC
HCT: 25.8 % — ABNORMAL LOW (ref 39.0–52.0)
Hemoglobin: 8.3 g/dL — ABNORMAL LOW (ref 13.0–17.0)
MCH: 26.8 pg (ref 26.0–34.0)
MCHC: 32.2 g/dL (ref 30.0–36.0)
MCV: 83.2 fL (ref 80.0–100.0)
Platelets: 332 K/uL (ref 150–400)
RBC: 3.1 MIL/uL — ABNORMAL LOW (ref 4.22–5.81)
RDW: 16.1 % — ABNORMAL HIGH (ref 11.5–15.5)
WBC: 7.9 K/uL (ref 4.0–10.5)
nRBC: 0.6 % — ABNORMAL HIGH (ref 0.0–0.2)

## 2024-04-24 LAB — IRON AND TIBC
Iron: 13 ug/dL — ABNORMAL LOW (ref 45–182)
Saturation Ratios: 4 % — ABNORMAL LOW (ref 17.9–39.5)
TIBC: 357 ug/dL (ref 250–450)
UIBC: 344 ug/dL

## 2024-04-24 LAB — BPAM RBC
Blood Product Expiration Date: 202511242359
ISSUE DATE / TIME: 202510311321
Unit Type and Rh: 5100

## 2024-04-24 LAB — TYPE AND SCREEN
ABO/RH(D): O POS
Antibody Screen: NEGATIVE
Unit division: 0

## 2024-04-24 LAB — COOXEMETRY PANEL
Carboxyhemoglobin: 1.6 % — ABNORMAL HIGH (ref 0.5–1.5)
Methemoglobin: <0.7 % (ref 0.0–1.5)
O2 Saturation: 64.5 %
Total hemoglobin: 8.9 g/dL — ABNORMAL LOW (ref 12.0–16.0)

## 2024-04-24 LAB — MAGNESIUM: Magnesium: 2 mg/dL (ref 1.7–2.4)

## 2024-04-24 MED ORDER — POTASSIUM CHLORIDE CRYS ER 20 MEQ PO TBCR
20.0000 meq | EXTENDED_RELEASE_TABLET | Freq: Once | ORAL | Status: AC
  Administered 2024-04-24: 20 meq via ORAL
  Filled 2024-04-24: qty 1

## 2024-04-24 MED ORDER — TRAMADOL HCL 50 MG PO TABS
50.0000 mg | ORAL_TABLET | Freq: Two times a day (BID) | ORAL | Status: DC | PRN
  Administered 2024-04-24 – 2024-04-25 (×2): 50 mg via ORAL
  Filled 2024-04-24 (×3): qty 1

## 2024-04-24 MED ORDER — APIXABAN 5 MG PO TABS
5.0000 mg | ORAL_TABLET | Freq: Two times a day (BID) | ORAL | Status: DC
  Administered 2024-04-24 – 2024-05-03 (×18): 5 mg via ORAL
  Filled 2024-04-24 (×18): qty 1

## 2024-04-24 NOTE — Progress Notes (Signed)
   04/24/24 2248  Assess: MEWS Score  ECG Heart Rate 63  Resp 20  Assess: MEWS Score  MEWS Temp 0  MEWS Systolic 1  MEWS Pulse 0  MEWS RR 0  MEWS LOC 0  MEWS Score 1  MEWS Score Color Green  Provider Notification  Provider Name/Title Dr. Orlando  Date Provider Notified 04/24/24  Time Provider Notified 2248  Method of Notification Page  Notification Reason New onset of dysrhythmia (6 beats of V-tach, pt is asymptomatic, vital signs are stable)  Provider response No new orders (monitor for recurrence and notifiy)  Date of Provider Response 04/24/24  Time of Provider Response 2324  Assess: SIRS CRITERIA  SIRS Temperature  0  SIRS Respirations  0  SIRS Pulse 0  SIRS WBC 0  SIRS Score Sum  0

## 2024-04-24 NOTE — Progress Notes (Signed)
 Keith Leblanc is an 58 y.o. male hypertension, dyslipidemia, T2DM, afib on Eliquis , CHF with grade third diastolic dysfunction/concentric LVH who was presented with symptomatic anemia, acute blood loss anemia seen as a consultation for the evaluation of AKI on CKD. Worsening SOB, DOE, BRBPR. Hypotensive in ED. EGD showed congested friable gastric mucosa likely the source of bleeding and colonoscopy on 10/30 showed hemorrhoids.  He was also managed for acute on chronic CHF exacerbation, EF was around 45% however grade third diastolic dysfunction.  Seen by cardiology team and placed on IV diuretics.    For CKD, patient was seen once at [facility] 2023; recent serum creatinine level fluctuating anywhere around 1.4-2.5. He has hypokalemia, noted albumin level of 2.4. No recent NSAID use and no IV contrast use. Jardiance  which is currently on hold.  He takes furosemide  40 mg at home.  Assessment/Plan: # Acute kidney injury on CKDIIIb-IV ( b/lcr 1.5-2.5): Acute kidney injury likely due to reduced renal perfusion (low effective circulatory volume) caused by persistent hypotension, severe anemia, ongoing use of diuretics for CHF.  Not on ACE inhibitor, ARB and no recent use of IV contrast. Appears to be CRS; hopefully did not develop ATN from hyperperfusion and hypotension.  -Currently on furosemide  120 mg twice daily with dobutamine started 10/31 evening. SABRA  He does have peripheral edema with low serum albumin level. Renal function still worsening but pt reports increased UOP since dobutamine started. Not enough time yet and hope renal function will improve with the dobutamine and improve hemodynamics. He is not uremic or dyspneic will follow closely with you.  - Continue with strict ins and out, daily lab.  -Kidney ultrasound neg for r/o obstruction (he does c/o feeling full after voiding); will check a PVR bladder scan as well - Continue to avoid ACE inhibitor, ARB or SGLT 2 inhibitor.   # Acute blood loss anemia  due to GI bleed, symptomatic.  Seen by gastroenterologist and underwent upper and lower GI endoscopy.  Received multiple units of blood transfusion with improvement of hemoglobin.  Back on anticoagulation now. S/p resection of polyps + hemorrhoids but were not actively bldg - Will check iron panel but should have good store after transfusion; ESA after iron panel.   # Persistent hypotension: Holding evening dose of diuretics.  Not on any antihypertensives.  Agree with midodrine and rx with albumin.   # Acute on chronic CHF with EF of 45 to 50%, diastolic dysfunction: Right heart cath 10/31 tomorrow.  Received a dose of IV Lasix  120 mg today.  Hold further diuretics until right heart cath as discussed above.  I have discussed with Dr. Rolan from HF.   # Metabolic acidosis: CO2 20, trend lab.  Subjective: Denies f/c/n/v/sob; he feels the lower ext edema is much better than previously. He thinks incr uop since dobutamine started.   Chemistry and CBC: Creat  Date/Time Value Ref Range Status  10/23/2023 10:05 AM 1.56 (H) 0.70 - 1.30 mg/dL Final  91/95/7978 90:93 AM 1.37 (H) 0.70 - 1.33 mg/dL Final    Comment:    For patients >7 years of age, the reference limit for Creatinine is approximately 13% higher for people identified as African-American. .    Creatinine, Ser  Date/Time Value Ref Range Status  04/24/2024 05:00 AM 5.08 (H) 0.61 - 1.24 mg/dL Final  89/68/7974 95:54 AM 4.82 (H) 0.61 - 1.24 mg/dL Final  89/69/7974 95:95 AM 4.49 (H) 0.61 - 1.24 mg/dL Final  89/70/7974 96:80 AM 4.25 (H) 0.61 -  1.24 mg/dL Final  89/71/7974 95:64 AM 4.00 (H) 0.61 - 1.24 mg/dL Final  89/72/7974 95:52 AM 3.97 (H) 0.61 - 1.24 mg/dL Final  89/74/7974 94:95 PM 3.13 (H) 0.61 - 1.24 mg/dL Final  89/98/7974 95:75 PM 2.47 (H) 0.61 - 1.24 mg/dL Final  90/69/7974 88:61 AM 2.42 (H) 0.40 - 1.50 mg/dL Final  90/74/7974 96:42 AM 2.88 (H) 0.61 - 1.24 mg/dL Final  90/75/7974 94:43 AM 3.05 (H) 0.61 - 1.24 mg/dL Final   90/76/7974 95:63 AM 2.79 (H) 0.61 - 1.24 mg/dL Final  90/77/7974 94:53 AM 3.08 (H) 0.61 - 1.24 mg/dL Final  90/78/7974 96:61 AM 3.16 (H) 0.61 - 1.24 mg/dL Final  90/79/7974 95:81 AM 2.63 (H) 0.61 - 1.24 mg/dL Final  90/80/7974 90:57 AM 2.53 (H) 0.61 - 1.24 mg/dL Final  93/73/7974 90:73 AM 1.46 (H) 0.76 - 1.27 mg/dL Final  89/91/7975 91:48 AM 1.60 (H) 0.76 - 1.27 mg/dL Final  93/74/7975 91:57 AM 1.47 0.40 - 1.50 mg/dL Final  94/77/7975 88:99 AM 1.74 (H) 0.40 - 1.50 mg/dL Final  94/80/7975 96:78 AM 1.29 (H) 0.61 - 1.24 mg/dL Final  94/81/7975 96:88 PM 1.37 (H) 0.61 - 1.24 mg/dL Final  94/81/7975 87:54 AM 1.54 (H) 0.61 - 1.24 mg/dL Final  94/82/7975 89:47 PM 1.80 (H) 0.61 - 1.24 mg/dL Final  94/82/7975 89:48 PM 1.66 (H) 0.61 - 1.24 mg/dL Final  96/72/7976 87:41 PM 1.60 (H) 0.40 - 1.50 mg/dL Final  96/77/7976 89:92 AM 1.75 (H) 0.40 - 1.50 mg/dL Final  96/97/7977 90:65 AM 1.41 0.40 - 1.50 mg/dL Final  98/79/7977 97:50 PM 1.35 (H) 0.76 - 1.27 mg/dL Final  90/98/7978 88:53 AM 1.27 0.76 - 1.27 mg/dL Final  87/96/7979 96:86 PM 1.25 0.40 - 1.50 mg/dL Final  87/73/7980 97:82 PM 1.32 (H) 0.61 - 1.24 mg/dL Final  89/88/7980 91:71 AM 1.34 0.40 - 1.50 mg/dL Final  88/84/7981 89:99 AM 1.17 0.40 - 1.50 mg/dL Final  98/76/7981 97:50 PM 1.11 0.40 - 1.50 mg/dL Final  98/83/7981 93:64 AM 1.18 0.61 - 1.24 mg/dL Final  98/84/7981 91:86 AM 1.74 (H) 0.61 - 1.24 mg/dL Final  98/84/7981 87:45 AM 1.78 (H) 0.61 - 1.24 mg/dL Final  98/85/7981 90:80 PM 1.85 (H) 0.61 - 1.24 mg/dL Final  98/85/7981 93:94 PM 2.31 (H) 0.61 - 1.24 mg/dL Final  98/85/7981 89:40 AM 2.76 (H) 0.61 - 1.24 mg/dL Final  88/71/7982 88:88 AM 1.18 0.40 - 1.50 mg/dL Final  87/83/7983 90:63 AM 1.15 0.40 - 1.50 mg/dL Final   Recent Labs  Lab 04/17/24 1704 04/19/24 0447 04/20/24 0435 04/21/24 0319 04/22/24 0404 04/23/24 0445 04/23/24 1058 04/23/24 1059 04/24/24 0500  NA 137 138 138 135 139 138 139 139 140  K 4.4 4.3 3.7 3.8 4.0 3.6 3.7  3.8 3.4*  CL 105 105 107 102 102 102  --   --  103  CO2 21* 21* 19* 18* 20* 18*  --   --  20*  GLUCOSE 174* 150* 155* 142* 157* 133*  --   --  139*  BUN 57* 73* 76* 77* 71* 70*  --   --  67*  CREATININE 3.13* 3.97* 4.00* 4.25* 4.49* 4.82*  --   --  5.08*  CALCIUM  9.1 8.8* 8.6* 8.9 8.8* 8.9  --   --  9.1  PHOS  --  6.2*  --   --   --   --   --   --   --    Recent Labs  Lab 04/21/24 0319 04/22/24 0404 04/23/24 0445 04/23/24  1058 04/23/24 1059 04/23/24 1216 04/23/24 1906 04/24/24 0500  WBC 7.9 7.5 7.4  --   --   --   --  7.9  HGB 7.6* 8.3* 7.1*   < > 7.8* 8.0* 8.5* 8.3*  HCT 23.7* 26.0* 22.9*   < > 23.0* 25.4* 26.6* 25.8*  MCV 84.3 84.1 83.9  --   --   --   --  83.2  PLT 396 453* 362  --   --   --   --  332   < > = values in this interval not displayed.   Liver Function Tests: Recent Labs  Lab 04/18/24 2343 04/19/24 0447 04/23/24 0445  AST 71* 73*  --   ALT 91* 102*  --   ALKPHOS 104 92  --   BILITOT 1.2 1.0  --   PROT 5.6* 5.3*  --   ALBUMIN 2.7* 2.4* 2.9*   No results for input(s): LIPASE, AMYLASE in the last 168 hours. No results for input(s): AMMONIA in the last 168 hours. Cardiac Enzymes: No results for input(s): CKTOTAL, CKMB, CKMBINDEX, TROPONINI in the last 168 hours. Iron Studies:  Recent Labs    04/24/24 0500  IRON 13*  TIBC 357  FERRITIN 14*   PT/INR: @LABRCNTIP (inr:5)  Xrays/Other Studies: ) Results for orders placed or performed during the hospital encounter of 04/17/24 (from the past 48 hours)  Glucose, capillary     Status: Abnormal   Collection Time: 04/22/24 11:22 AM  Result Value Ref Range   Glucose-Capillary 151 (H) 70 - 99 mg/dL    Comment: Glucose reference range applies only to samples taken after fasting for at least 8 hours.  Urinalysis, Routine w reflex microscopic -Urine, Clean Catch     Status: Abnormal   Collection Time: 04/22/24  2:46 PM  Result Value Ref Range   Color, Urine YELLOW YELLOW   APPearance CLOUDY  (A) CLEAR   Specific Gravity, Urine 1.008 1.005 - 1.030   pH 5.0 5.0 - 8.0   Glucose, UA NEGATIVE NEGATIVE mg/dL   Hgb urine dipstick MODERATE (A) NEGATIVE   Bilirubin Urine NEGATIVE NEGATIVE   Ketones, ur NEGATIVE NEGATIVE mg/dL   Protein, ur 30 (A) NEGATIVE mg/dL   Nitrite NEGATIVE NEGATIVE   Leukocytes,Ua NEGATIVE NEGATIVE   RBC / HPF 0-5 0 - 5 RBC/hpf   WBC, UA 0-5 0 - 5 WBC/hpf   Bacteria, UA RARE (A) NONE SEEN   Squamous Epithelial / HPF 0-5 0 - 5 /HPF   Mucus PRESENT    Hyaline Casts, UA PRESENT    Amorphous Crystal PRESENT     Comment: Performed at [facility], [address] [place], [place] [address]  Protein / creatinine ratio, urine     Status: Abnormal   Collection Time: 04/22/24  2:46 PM  Result Value Ref Range   Creatinine, Urine 44 mg/dL   Total Protein, Urine 62 mg/dL    Comment: NO NORMAL RANGE ESTABLISHED FOR THIS TEST   Protein Creatinine Ratio 1.41 (H) 0.00 - 0.15 mg/mg[Cre]    Comment: Performed at [facility], [address] [place], [place] [address]  Glucose, capillary     Status: Abnormal   Collection Time: 04/22/24  4:47 PM  Result Value Ref Range   Glucose-Capillary 160 (H) 70 - 99 mg/dL    Comment: Glucose reference range applies only to samples taken after fasting for at least 8 hours.  Glucose, capillary     Status: Abnormal  Collection Time: 04/22/24  9:47 PM  Result Value Ref Range   Glucose-Capillary 151 (H) 70 - 99 mg/dL    Comment: Glucose reference range applies only to samples taken after fasting for at least 8 hours.  Basic metabolic panel with GFR     Status: Abnormal   Collection Time: 04/23/24  4:45 AM  Result Value Ref Range   Sodium 138 135 - 145 mmol/L   Potassium 3.6 3.5 - 5.1 mmol/L   Chloride 102 98 - 111 mmol/L   CO2 18 (L) 22 - 32 mmol/L   Glucose, Bld 133 (H) 70 - 99 mg/dL    Comment: Glucose reference range applies only to samples taken after fasting for at least 8 hours.   BUN 70 (H) 6 - 20 mg/dL    Creatinine, Ser 5.17 (H) 0.61 - 1.24 mg/dL   Calcium  8.9 8.9 - 10.3 mg/dL   GFR, Estimated 13 (L) >60 mL/min    Comment: (NOTE) Calculated using the CKD-EPI Creatinine Equation (2021)    Anion gap 18 (H) 5 - 15    Comment: Performed at [facility], [address] [place], [place] [address]  CBC     Status: Abnormal   Collection Time: 04/23/24  4:45 AM  Result Value Ref Range   WBC 7.4 4.0 - 10.5 K/uL   RBC 2.73 (L) 4.22 - 5.81 MIL/uL   Hemoglobin 7.1 (L) 13.0 - 17.0 g/dL   HCT 77.0 (L) 60.9 - 47.9 %   MCV 83.9 80.0 - 100.0 fL   MCH 26.0 26.0 - 34.0 pg   MCHC 31.0 30.0 - 36.0 g/dL   RDW 84.0 (H) 88.4 - 84.4 %   Platelets 362 150 - 400 K/uL   nRBC 0.9 (H) 0.0 - 0.2 %    Comment: Performed at [facility], [address] [place], [place] [address]  Magnesium      Status: None   Collection Time: 04/23/24  4:45 AM  Result Value Ref Range   Magnesium  2.1 1.7 - 2.4 mg/dL    Comment: Performed at [facility], [address] [place], [place] [address]  Albumin     Status: Abnormal   Collection Time: 04/23/24  4:45 AM  Result Value Ref Range   Albumin 2.9 (L) 3.5 - 5.0 g/dL    Comment: Performed at [facility], [address] [place], [place] [address]  POCT I-Stat EG7     Status: Abnormal   Collection Time: 04/23/24 10:58 AM  Result Value Ref Range   pH, Ven 7.366 7.25 - 7.43   pCO2, Ven 33.2 (L) 44 - 60 mmHg   pO2, Ven 27 (LL) 32 - 45 mmHg   Bicarbonate 19.0 (L) 20.0 - 28.0 mmol/L   TCO2 20 (L) 22 - 32 mmol/L   O2 Saturation 48 %   Acid-base deficit 6.0 (H) 0.0 - 2.0 mmol/L   Sodium 139 135 - 145 mmol/L   Potassium 3.7 3.5 - 5.1 mmol/L   Calcium , Ion 1.21 1.15 - 1.40 mmol/L   HCT 24.0 (L) 39.0 - 52.0 %   Hemoglobin 8.2 (L) 13.0 - 17.0 g/dL   Sample type VENOUS    Comment NOTIFIED PHYSICIAN   POCT I-Stat EG7     Status: Abnormal   Collection Time: 04/23/24 10:59 AM  Result Value Ref Range   pH, Ven 7.367 7.25 - 7.43   pCO2, Ven 33.3 (L) 44 -  60 mmHg   pO2,  Ven 26 (LL) 32 - 45 mmHg   Bicarbonate 19.1 (L) 20.0 - 28.0 mmol/L   TCO2 20 (L) 22 - 32 mmol/L   O2 Saturation 46 %   Acid-base deficit 6.0 (H) 0.0 - 2.0 mmol/L   Sodium 139 135 - 145 mmol/L   Potassium 3.8 3.5 - 5.1 mmol/L   Calcium , Ion 1.21 1.15 - 1.40 mmol/L   HCT 23.0 (L) 39.0 - 52.0 %   Hemoglobin 7.8 (L) 13.0 - 17.0 g/dL   Sample type VENOUS    Comment NOTIFIED PHYSICIAN   Type and screen [facility]     Status: None (Preliminary result)   Collection Time: 04/23/24 12:15 PM  Result Value Ref Range   ABO/RH(D) O POS    Antibody Screen NEG    Sample Expiration 04/26/2024,2359    Unit Number [id number]    Blood Component Type RBC LR PHER2    Unit division 00    Status of Unit ISSUED    Transfusion Status OK TO TRANSFUSE    Crossmatch Result      Compatible Performed at [facility], [address], [place], [place] [address]   Hemoglobin and hematocrit, blood     Status: Abnormal   Collection Time: 04/23/24 12:16 PM  Result Value Ref Range   Hemoglobin 8.0 (L) 13.0 - 17.0 g/dL   HCT 74.5 (L) 60.9 - 47.9 %    Comment: Performed at [facility], [address], [place], [place] [address]  Prepare RBC (crossmatch)     Status: None   Collection Time: 04/23/24 12:16 PM  Result Value Ref Range   Order Confirmation      ORDER PROCESSED BY BLOOD BANK Performed at [facility], [address], [place], [place] [address]   Glucose, capillary     Status: Abnormal   Collection Time: 04/23/24  2:40 PM  Result Value Ref Range   Glucose-Capillary 114 (H) 70 - 99 mg/dL    Comment: Glucose reference range applies only to samples taken after fasting for at least 8 hours.  Glucose, capillary     Status: Abnormal   Collection Time: 04/23/24  4:08 PM  Result Value Ref Range   Glucose-Capillary 114 (H) 70 - 99 mg/dL    Comment: Glucose reference range applies only to samples taken after fasting for at least 8 hours.  Cooxemetry  Panel (carboxy, met, total hgb, O2 sat)     Status: Abnormal   Collection Time: 04/23/24  7:02 PM  Result Value Ref Range   Total hemoglobin 8.7 (L) 12.0 - 16.0 g/dL   O2 Saturation 43.8 %   Carboxyhemoglobin 2.9 (H) 0.5 - 1.5 %   Methemoglobin <0.7 0.0 - 1.5 %    Comment: Performed at [facility], [address] [place], [place] [address]  Hemoglobin and hematocrit, blood     Status: Abnormal   Collection Time: 04/23/24  7:06 PM  Result Value Ref Range   Hemoglobin 8.5 (L) 13.0 - 17.0 g/dL   HCT 73.3 (L) 60.9 - 47.9 %    Comment: Performed at [facility], [address], [place], [place] [address]  Glucose, capillary     Status: Abnormal   Collection Time: 04/23/24  9:09 PM  Result Value Ref Range   Glucose-Capillary 167 (H) 70 - 99 mg/dL    Comment: Glucose reference range applies only to samples taken after fasting for at least 8 hours.  Glucose,  capillary     Status: Abnormal   Collection Time: 04/24/24  4:58 AM  Result Value Ref Range   Glucose-Capillary 124 (H) 70 - 99 mg/dL    Comment: Glucose reference range applies only to samples taken after fasting for at least 8 hours.  Basic metabolic panel with GFR     Status: Abnormal   Collection Time: 04/24/24  5:00 AM  Result Value Ref Range   Sodium 140 135 - 145 mmol/L   Potassium 3.4 (L) 3.5 - 5.1 mmol/L   Chloride 103 98 - 111 mmol/L   CO2 20 (L) 22 - 32 mmol/L   Glucose, Bld 139 (H) 70 - 99 mg/dL    Comment: Glucose reference range applies only to samples taken after fasting for at least 8 hours.   BUN 67 (H) 6 - 20 mg/dL   Creatinine, Ser 4.91 (H) 0.61 - 1.24 mg/dL   Calcium  9.1 8.9 - 10.3 mg/dL   GFR, Estimated 13 (L) >60 mL/min    Comment: (NOTE) Calculated using the CKD-EPI Creatinine Equation (2021)    Anion gap 17 (H) 5 - 15    Comment: Performed at [facility], [address] [place], [place] [address]  CBC     Status: Abnormal   Collection Time: 04/24/24  5:00 AM  Result Value Ref Range    WBC 7.9 4.0 - 10.5 K/uL   RBC 3.10 (L) 4.22 - 5.81 MIL/uL   Hemoglobin 8.3 (L) 13.0 - 17.0 g/dL   HCT 74.1 (L) 60.9 - 47.9 %   MCV 83.2 80.0 - 100.0 fL   MCH 26.8 26.0 - 34.0 pg   MCHC 32.2 30.0 - 36.0 g/dL   RDW 83.8 (H) 88.4 - 84.4 %   Platelets 332 150 - 400 K/uL   nRBC 0.6 (H) 0.0 - 0.2 %    Comment: Performed at [facility], [address] [place], [place] [address]  Ferritin     Status: Abnormal   Collection Time: 04/24/24  5:00 AM  Result Value Ref Range   Ferritin 14 (L) 24 - 336 ng/mL    Comment: Performed at [facility], [address] [place], [place] [address]  Iron and TIBC     Status: Abnormal   Collection Time: 04/24/24  5:00 AM  Result Value Ref Range   Iron 13 (L) 45 - 182 ug/dL   TIBC 642 749 - 549 ug/dL   Saturation Ratios 4 (L) 17.9 - 39.5 %   UIBC 344 ug/dL    Comment: Performed at [facility], [address], [place], [place] [address]  Magnesium      Status: None   Collection Time: 04/24/24  5:00 AM  Result Value Ref Range   Magnesium  2.0 1.7 - 2.4 mg/dL    Comment: Performed at [facility], [address] [place], [place] [address]  Cooxemetry Panel (carboxy, met, total hgb, O2 sat)     Status: Abnormal   Collection Time: 04/24/24  5:10 AM  Result Value Ref Range   Total hemoglobin 8.9 (L) 12.0 - 16.0 g/dL   O2 Saturation 35.4 %   Carboxyhemoglobin 1.6 (H) 0.5 - 1.5 %   Methemoglobin <0.7 0.0 - 1.5 %    Comment: Performed at [facility], [address], [place], [place] [address]  Glucose, capillary     Status: Abnormal   Collection Time: 04/24/24  6:31 AM  Result Value Ref Range  Glucose-Capillary 146 (H) 70 - 99 mg/dL    Comment: Glucose reference range applies only to samples taken after fasting for at least 8 hours.   DG CHEST PORT 1 VIEW Result Date: 04/23/2024 CLINICAL DATA:  Central line placement EXAM: PORTABLE CHEST 1 VIEW COMPARISON:  Chest x-ray 04/17/2024, 03/12/2024 FINDINGS: Mild cardiomegaly  with aortic atherosclerosis. No acute airspace disease, pleural effusion or pneumothorax. Left IJ central venous catheter tip projecting over the SVC origin. No pneumothorax is seen. IMPRESSION: Left IJ central venous catheter tip projects over the SVC origin. No pneumothorax. Mild cardiomegaly Electronically Signed   By: Luke Bun M.D.   On: 04/23/2024 16:46   CARDIAC CATHETERIZATION Result Date: 04/23/2024 1. Elevated right-sided filling pressures out of proportion to mildly elevated PCWP. 2. Mild pulmonary venous hypertension 3. Low cardiac output by thermo though preserved by Fick. 4. Low PAPi Suspect primarily RV dysfunction.  Will start dobutamine 2.5 for RV support.   US  RENAL Result Date: 04/22/2024 CLINICAL DATA:  Acute renal insufficiency. EXAM: RENAL / URINARY TRACT ULTRASOUND COMPLETE COMPARISON:  12/11/2021 FINDINGS: Right Kidney: Renal measurements: 12.0 x 4.6 x 6.0 cm = volume: 174 mL. Echogenicity within normal limits. No mass or hydronephrosis visualized. Left Kidney: Renal measurements: 10.5 x 5.9 x 5.7 cm = volume: 186 mL. Echogenicity within normal limits. No mass or hydronephrosis visualized. Bladder: Appears normal for degree of bladder distention. Bilateral ureteral jets visualized. Other: Small right pleural effusion.  Tiny amount of perihepatic fluid. IMPRESSION: 1. Normal kidneys and bladder. 2. Small right pleural effusion. Tiny amount of perihepatic fluid. Electronically Signed   By: Toribio Agreste M.D.   On: 04/22/2024 16:39    PMH:   Past Medical History:  Diagnosis Date   Blood in stool    bright red blood    Chicken pox    Diabetes mellitus (HCC)    Erectile dysfunction    Gout    Hypertension    Stroke [facility])     PSH:   Past Surgical History:  Procedure Laterality Date   APPENDECTOMY  2004   COLONOSCOPY     COLONOSCOPY N/A 04/22/2024   Procedure: COLONOSCOPY;  Surgeon: Stacia Glendia BRAVO, MD;  Location[facility] ENDOSCOPY;  Service: Gastroenterology;   Laterality: N/A;   ENDOMYOCARDIAL BIOPSY N/A 03/16/2024   Procedure: ENDOMYOCARDIAL BIOPSY;  Surgeon: Zenaida Morene PARAS, MD;  Location: [facility] INVASIVE CV LAB;  Service: Cardiovascular;  Laterality: N/A;   ESOPHAGOGASTRODUODENOSCOPY N/A 04/19/2024   Procedure: EGD (ESOPHAGOGASTRODUODENOSCOPY);  Surgeon: Stacia Glendia BRAVO, MD;  Location: [facility] ENDOSCOPY;  Service: Gastroenterology;  Laterality: N/A;   RIGHT HEART CATH N/A 03/16/2024   Procedure: RIGHT HEART CATH;  Surgeon: Zenaida Morene PARAS, MD;  Location: [facility] INVASIVE CV LAB;  Service: Cardiovascular;  Laterality: N/A;   TOOTH EXTRACTION  07/06/2020    Allergies: No Known Allergies  Medications:   Prior to Admission medications   Medication Sig Start Date End Date Taking? Authorizing Provider  apixaban  (ELIQUIS ) 5 MG TABS tablet Take 1 tablet (5 mg total) by mouth 2 (two) times daily. 04/08/24  Yes Hayes Beckey CROME, NP  bisoprolol  (ZEBETA ) 10 MG tablet Take 2 tablets (20 mg total) by mouth daily. 04/08/24  Yes Hayes Beckey CROME, NP  buPROPion  (WELLBUTRIN  XL) 300 MG 24 hr tablet TAKE 1 TABLET BY MOUTH ONCE  DAILY 04/13/24  Yes Nafziger, Darleene, NP  colchicine  0.6 MG tablet TAKE 1 TABLET BY MOUTH DAILY  WHEN HAVING FLARES TAKE 1 TABLET BY MOUTH TWICE DAILY Patient taking  differently: Take 0.6 mg by mouth as needed. TAKE 1 TABLET BY MOUTH  DAILY WHEN HAVING FLARES  TAKE 1 TABLET BY MOUTH  TWICE DAILY 09/05/23  Yes Nafziger, Darleene, NP  empagliflozin  (JARDIANCE ) 10 MG TABS tablet Take 1 tablet (10 mg total) by mouth daily. 04/08/24  Yes Hayes Beckey CROME, NP  furosemide  (LASIX ) 20 MG tablet Take 2 tablets (40 mg total) by mouth as needed (For increased swelling, weight gain of 3 LB in 1 day or 5 LB in 1 week). 04/08/24  Yes Hayes Beckey CROME, NP  hydrOXYzine (VISTARIL) 50 MG capsule Take 1 capsule (50 mg total) by mouth every 8 (eight) hours as needed. Patient taking differently: Take 50 mg by mouth every 8 (eight) hours as needed for itching. 03/23/24  Yes Nafziger, Darleene, NP   magnesium  oxide (MAG-OX) 400 (240 Mg) MG tablet TAKE 1 TABLET(400 MG) BY MOUTH DAILY 02/12/24  Yes West, Katlyn D, NP  Multiple Vitamins-Minerals (ONE-A-DAY MENS 50+) TABS Take 1 tablet by mouth daily with breakfast.   Yes [provider]  omeprazole  (PRILOSEC) 40 MG capsule TAKE 1 CAPSULE BY MOUTH DAILY Patient taking differently: Take 40 mg by mouth daily before breakfast. 11/12/23  Yes Nafziger, Darleene, NP  potassium chloride  SA (KLOR-CON  M) 20 MEQ tablet Take 2 tablets (40 mEq total) by mouth daily as needed (take with lasix  as needed dose). 04/08/24  Yes Hayes Beckey CROME, NP  rosuvastatin  (CRESTOR ) 20 MG tablet Take 1 tablet (20 mg total) by mouth daily. 04/08/24  Yes Hayes Beckey CROME, NP  thiamine  (VITAMIN B-1) 100 MG tablet Take 1 tablet (100 mg total) by mouth daily. 11/11/22  Yes Toberman, Stevi W, NP  triamcinolone cream (KENALOG) 0.5 % Apply 1 Application topically 2 (two) times daily. 03/23/24  Yes Nafziger, Darleene, NP  blood glucose meter kit and supplies Dispense based on patient and insurance preference. Use up to four times daily as directed. (FOR ICD-9 250.00, 250.01). 07/09/16   Sebastian Toribio GAILS, MD  glucose blood (ONETOUCH ULTRA TEST) test strip Use to check blood sugar 2-3 times a day. 03/01/24   Trixie File, MD  Insulin  Pen Needle (B-D UF III MINI PEN NEEDLES) 31G X 5 MM MISC USE 4 TIMES DAILY AFTER MEALS AND AT BEDTIME 02/24/20   Trixie File, MD  Lancets [facility] ULTRASOFT) lancets Test blood sugars as directed. Dx E11.9 08/06/16   Nafziger, Cory, NP  nicotine  (NICODERM CQ  - DOSED IN MG/24 HR) 7 mg/24hr patch Place 1 patch (7 mg total) onto the skin daily. Patient not taking: Reported on 04/17/2024 03/19/24   Fairy Frames, MD  potassium chloride  (KLOR-CON ) 10 MEQ tablet TAKE 1 TABLET(10 MEQ) BY MOUTH DAILY Patient not taking: Reported on 04/17/2024 04/08/24   Hayes Beckey CROME, NP    Discontinued Meds:   Medications Discontinued During This Encounter  Medication Reason    CLEAR EYES NATURAL TEARS 5-6 MG/ML SOLN No longer needed (for PRN medications)   allopurinol  (ZYLOPRIM ) 300 MG tablet Patient Preference   apixaban  (ELIQUIS ) tablet 5 mg    midodrine (PROAMATINE) tablet 10 mg    pantoprazole  (PROTONIX ) injection 40 mg P&T Policy: IV to PO Conversion   Na Sulfate-K Sulfate-Mg Sulfate concentrate (SUPREP) kit 177 mL    Na Sulfate-K Sulfate-Mg Sulfate concentrate (SUPREP) kit 177 mL    simethicone  (MYLICON) chewable tablet 240 mg    simethicone  (MYLICON) chewable tablet 240 mg    sodium chloride  0.9 % bolus 500 mL    midodrine (PROAMATINE)  tablet 10 mg    furosemide  (LASIX ) 120 mg in dextrose  5 % 50 mL IVPB    DOBUTamine (DOBUTREX) infusion 4000 mcg/mL    furosemide  (LASIX ) 120 mg in dextrose  5 % 50 mL IVPB    Heparin  (Porcine) in NaCl 1000-0.9 UT/500ML-% SOLN Patient Transfer   lidocaine  (PF) (XYLOCAINE ) 1 % injection Patient Transfer    Social History:  reports that he quit smoking about 6 weeks ago. His smoking use included cigarettes and cigars. He has never used smokeless tobacco. He reports that he does not currently use alcohol. He reports that he does not currently use drugs after having used the following drugs: Marijuana.  Family History:   Family History  Problem Relation Age of Onset   Alcohol abuse Father    Hypertension Father    Heart disease Father    Gout Father    Breast cancer Mother    Hypertension Mother    Gout Mother    Healthy Sister    Healthy Daughter    Colon cancer Neg Hx    Esophageal cancer Neg Hx    Rectal cancer Neg Hx    Stomach cancer Neg Hx     Blood pressure 100/73, pulse (!) 52, temperature 98.2 F (36.8 C), temperature source Oral, resp. rate 20, height 6' 5 (1.956 m), weight 107.6 kg, SpO2 97%. Physical Exam: General exam: Appears calm and comfortable  Respiratory system: CTA b/l, no rales Cardiovascular system: S1 & S2 heard, RRR.   Gastrointestinal system: Abdomen is nondistended, soft and nontender.  Normal bowel sounds heard. Central nervous system: Alert and oriented. No focal neurological deficits. Extremities: LE edema + Skin: No rashes, lesions or ulcers     Hawa Henly, LYNWOOD ORN, MD 04/24/2024, 10:46 AM

## 2024-04-24 NOTE — Plan of Care (Signed)
  Problem: Clinical Measurements: Goal: Will remain free from infection Outcome: Progressing Goal: Respiratory complications will improve Outcome: Progressing Goal: Cardiovascular complication will be avoided Outcome: Progressing   Problem: Activity: Goal: Risk for activity intolerance will decrease Outcome: Progressing   Problem: Elimination: Goal: Will not experience complications related to bowel motility Outcome: Progressing Goal: Will not experience complications related to urinary retention Outcome: Progressing   Problem: Safety: Goal: Ability to remain free from injury will improve Outcome: Progressing   Problem: Tissue Perfusion: Goal: Adequacy of tissue perfusion will improve Outcome: Progressing

## 2024-04-24 NOTE — Progress Notes (Signed)
 Advanced Heart Failure Rounding Note  Cardiologist: None  Chief Complaint: A/c HFmrEF, GIB   Patient Profile   58 y/o male w/ prior h/o heavy ETOH use (recently quit), HFmrEF, CKDIIIa, PAF on Eliquis , HTN, HLD and Type 2DM, admitted w/ symptomatic ABLA 2/2 GIB. HF management c/b persistent hypotension and AKI despite adequate volume resuscitation. AHF team asked to assist.   Subjective:    Echo this admit EF 40-45% (previously 45-50%), severe LVH, RV mod reduced, RVSP 42, dilated IVC estimated RAP ~15 mmHg   While creatinine rising today, his BUN is actually trending down and his GFR is unchanged.  Patient is frustrated with his recent care.  Frustrated with his multiple procedures yesterday and lack of significant improvement.  He also is very apprehensive about potential need for dialysis.  We discussed that he does not meet any acute indications for renal placement therapy at this time.  Urine output is slowly picking up, improvement in Co. ox to 64 this morning    Objective:   Weight Range: 107.6 kg Body mass index is 28.13 kg/m.   Vital Signs:   Temp:  [97.8 F (36.6 C)-98.3 F (36.8 C)] 98.2 F (36.8 C) (11/01 2025) Pulse Rate:  [52-68] 68 (11/01 2025) Resp:  [14-22] 20 (11/01 2120) BP: (85-100)/(59-73) 97/64 (11/01 2120) SpO2:  [97 %-100 %] 98 % (11/01 2025) Weight:  [107.6 kg] 107.6 kg (11/01 0455) Last BM Date : 04/24/24  Weight change: Filed Weights   04/19/24 0910 04/23/24 0500 04/24/24 0455  Weight: 105.2 kg 105.2 kg 107.6 kg    Intake/Output:   Intake/Output Summary (Last 24 hours) at 04/24/2024 2215 Last data filed at 04/24/2024 2119 Gross per 24 hour  Intake 328.71 ml  Output 1850 ml  Net -1521.29 ml      Physical Exam   GENERAL: NAD Lungs- diminished at bases  CARDIAC:  JVP 8         Irregular rate and rhythm. No MRG, trace LE edema ABDOMEN: Soft, non-tender, non-distended.  EXTREMITIES: Warm and well perfused.  NEUROLOGIC: No obvious  FND   EKG   Atrial fibrillation 58 bpm   Labs    CBC Recent Labs    04/23/24 0445 04/23/24 1058 04/23/24 1906 04/24/24 0500  WBC 7.4  --   --  7.9  HGB 7.1*   < > 8.5* 8.3*  HCT 22.9*   < > 26.6* 25.8*  MCV 83.9  --   --  83.2  PLT 362  --   --  332   < > = values in this interval not displayed.   Basic Metabolic Panel Recent Labs    89/68/74 0445 04/23/24 1058 04/23/24 1059 04/24/24 0500  NA 138   < > 139 140  K 3.6   < > 3.8 3.4*  CL 102  --   --  103  CO2 18*  --   --  20*  GLUCOSE 133*  --   --  139*  BUN 70*  --   --  67*  CREATININE 4.82*  --   --  5.08*  CALCIUM  8.9  --   --  9.1  MG 2.1  --   --  2.0   < > = values in this interval not displayed.   Liver Function Tests Recent Labs    04/23/24 0445  ALBUMIN 2.9*   No results for input(s): LIPASE, AMYLASE in the last 72 hours. Cardiac Enzymes No results for input(s): CKTOTAL, CKMB,  CKMBINDEX, TROPONINI in the last 72 hours.  BNP: BNP (last 3 results) Recent Labs    03/24/24 1624 04/17/24 1704  BNP 1,093.8* 1,324.0*    ProBNP (last 3 results) Recent Labs    03/12/24 0942  PROBNP 5,364.0*     D-Dimer No results for input(s): DDIMER in the last 72 hours. Hemoglobin A1C No results for input(s): HGBA1C in the last 72 hours. Fasting Lipid Panel No results for input(s): CHOL, HDL, LDLCALC, TRIG, CHOLHDL, LDLDIRECT in the last 72 hours. Thyroid  Function Tests No results for input(s): TSH, T4TOTAL, T3FREE, THYROIDAB in the last 72 hours.  Invalid input(s): FREET3  Other results:    Medications:     Scheduled Medications:  sodium chloride    Intravenous Once   buPROPion   300 mg Oral Daily   Chlorhexidine  Gluconate Cloth  6 each Topical Daily   insulin  aspart  0-9 Units Subcutaneous TID WC   midodrine  15 mg Oral TID WC   pantoprazole   40 mg Oral BID   rosuvastatin   20 mg Oral Daily   simethicone   240 mg Oral Once   sodium chloride  flush   10-40 mL Intracatheter Q12H    Infusions:  DOBUTamine     furosemide  120 mg (04/24/24 1910)    PRN Medications: acetaminophen  **OR** acetaminophen , ondansetron  **OR** ondansetron  (ZOFRAN ) IV, sodium chloride  flush, traMADol      Assessment/Plan   1. Acute on chronic HF with mid range EF: Echo in 8/25 showed EF 45-50%, severe LVH, abnormal RV free wall strain, ~17%, RV mildly reduced.  Cardiac MRI showed moderate concentric LVH with EF 43%, RV EF 47%, ECV 36%, diffuse patchy LGE throughout LV myocardium. PYP scan negative.  He had an endomyocardial biopsy that was negative for cardiac amyloidosis. Suspect hypertrophic cardiomyopathy. Creatinine stable today predominant RV failure with elevated CVP. Improving on DBA, continue current dose today.   - Continue lasix  120mg  IV BID, DBA at 2.5 - RHC reviewed with patient - Continue midodrine for now to maintain BP   2. AKI on CKD stage 3b: Renal US  negative. ?ATN in setting of hypotension from GI bleeding.  Creatinine continues to rise today at 5, BUN down and GFR stable  (b/l 2.5-3.0) - nephrology now following, conducting w/u to exclude nephrotic syndrome  - Multiple Myeloma panel 9/25 negative for M-spike protein   3. Atrial fibrillation: Paroxysmal. In rate controlled afib this admit  - Eliquis  on hold with GI bleeding. Restart this evening with hgb stable, no more bloody stools - can plan outpatient DCCV after back on Eliquis  x 3 wks   4. GI bleeding: Has had transfusions.  EGD with gastritis.  Colonoscopy w/ 2 polyps (resected) and non bleeding hemorrhoids. - no further gross bleeding  - Hgb stable at 8.3 - Apixaban  today  5. ETOH abuse.  - he reports he quit ~6 wks ago    Length of Stay: 7  [name], MD  04/24/2024, 10:15 PM

## 2024-04-24 NOTE — Plan of Care (Signed)
  Problem: Health Behavior/Discharge Planning: Goal: Ability to manage health-related needs will improve Outcome: Progressing   Problem: Clinical Measurements: Goal: Ability to maintain clinical measurements within normal limits will improve Outcome: Progressing Goal: Cardiovascular complication will be avoided Outcome: Progressing   Problem: Pain Managment: Goal: General experience of comfort will improve and/or be controlled Outcome: Progressing   Problem: Fluid Volume: Goal: Ability to maintain a balanced intake and output will improve Outcome: Progressing   Problem: Metabolic: Goal: Ability to maintain appropriate glucose levels will improve Outcome: Progressing

## 2024-04-24 NOTE — Progress Notes (Signed)
 PROGRESS NOTE Keith Leblanc  FMW:[id number] DOB: [birth year] DOA: 04/17/2024 PCP: [name], NP  Brief Narrative/Hospital Course: Keith Leblanc is a 58 y.o. male with PMH of of prior stroke, hypertension, diabetes who presented to the ED due to shortness of breath w/ progressive worsening over the last week and noticed bright red blood in stool , he in ED BP soft, labs showed bicarb 21, creatinine 3.1 baseline around 3, WBC 8.0, hemoglobin 7.5 baseline around 12, troponin 59, 54, INR 1.6.   CXR>>no acute changes.GI was consulted and started on IV PPI BID.  S/P 1 unit of packed RBCs for Hg 6.8. His Eliquis  was held. Last colonoscopy in 2019 which showed diverticulosis and multiple polyps.  Patient endorsed improving shortness of breath following transfusion no chest pain. Underwent EGD 10/27 has congested friable gastric mucosa likely the source of bleeding  10/30 colonoscopy showed hemorrhoids likely cause of patient's blood in the stool Patient having ongoing AKI nephro cardio following 10/31 underwent RHC, and had central line placement- 1. Elevated right-sided filling pressures out of proportion to mildly elevated PCWP.  2. Mild pulmonary venous hypertension 3. Low cardiac output by thermo though preserved by Fick.  4. Low PAPi  Suspect primarily RV dysfunction.  Will start dobutamine 2.5 for RV support.  Subjective: Seen and examined Appears anxious today Overnight BP stable in 90s, on room air afebrile UOP 950cc Labs with creatinine at 5.0 uptrending, low iron saturation hemoglobin at 8.3  Assessment and plan:  ABLA due to upper GI bleed with friable congested gastric mucosa Hematochezia due to internal hemorrhoids:  history of LA grade a reflux esophagitis and gastritis seen on EGD done in 2019. S/p EGD 10/27: congested friable gastric mucosa likely the source of bleeding.  S/p Colonoscopy 10/30:3 to 5 mm polyps in transverse colon removed, nonbleeding internal hemorrhoids  noticed likely cause of hematochezia but doubt it accounts for patient's drop in hemoglobin.  So far received 3 units PRBC- cont to monitor hemoglobin transfuse if less than 8 g Continue to monitor hemoglobin continue holding eliquis , cont ppi  Recent Labs    04/23/24 0445 04/23/24 1058 04/23/24 1059 04/23/24 1216 04/23/24 1906 04/24/24 0500  HGB 7.1* 8.2* 7.8* 8.0* 8.5* 8.3*  MCV 83.9  --   --   --   --  83.2  FERRITIN  --   --   --   --   --  14*  TIBC  --   --   --   --   --  357  IRON  --   --   --   --   --  13*   Acute on chronic CHF with midrange EF 43% RV dysfunction HCM Hypertension history Hypotension-persistent: Prior to admission on GDMT. Placed on midodrine, GDMT held, midodrine dose increased to 15mg  tid Continue to hold antihypertensives and GDMT-advanced heart failure team following repeat TTE ordered but showed EF 40-45% global SMK severe left ventricular hypertrophy G3 DD.  AHF team following now on high-dose Lasix  120mg  bid  S/p RHC 10/31, and central line placement and placed on dobutamine drip as suspecting primary RV dysfunction Cont per CHF  team we will cont to monitor daily I/O,weight, electrolytes and net balance as below.Keep on  salt/fluid restricted diet and monitor in tele.Net IO Since Admission: 3,816.77 mL [04/24/24 1157]  Filed Weights   04/19/24 0910 04/23/24 0500 04/24/24 0455  Weight: 105.2 kg 105.2 kg 107.6 kg    AKI on CKD stage V Metabolic acidosis Hypokalemia:  Creat b/l 1.5-2.5.likely multifactorial AKI on CKD due to GI bleeding, severe anemia, hypotension,RV dysfunction and ned for diuretics Nephro has been consulted creatinine continues to worsen and On high-dose Lasix  per advanced heart failure team Continue diuresis/Iv albumin as per nephrology/cardio -  Monitor urine output, renal function as below Recent Labs    03/18/24 0357 03/23/24 1138 03/24/24 1624 04/17/24 1704 04/19/24 0447 04/20/24 0435 04/21/24 0319 04/22/24 0404  04/23/24 0445 04/23/24 1058 04/23/24 1059 04/24/24 0500  BUN 24* 27* 31* 57* 73* 76* 77* 71* 70*  --   --  67*  CREATININE 2.88* 2.42* 2.47* 3.13* 3.97* 4.00* 4.25* 4.49* 4.82*  --   --  5.08*  CO2 28 30 25  21* 21* 19* 18* 20* 18*  --   --  20*  K 4.7 4.4 4.3 4.4 4.3 3.7 3.8 4.0 3.6 3.7 3.8 3.4*   Intake/Output Summary (Last 24 hours) at 04/24/2024 1157 Last data filed at 04/24/2024 0902 Gross per 24 hour  Intake 1004.03 ml  Output 950 ml  Net 54.03 ml    Chronic depression: Mood stable, continue home bupropion    Elevated troponin: Due to demand ischemia.  Plan per cardiology  Hyperlipidemia: continue home rosuvastatin    Persistent A-fib: Eliquis  on hold due to ongoing anemia.  DVT prophylaxis: Place TED hose Start: 04/21/24 1237 SCDs Start: 04/17/24 2333 Code Status:   Code Status: Full Code Family Communication: plan of care discussed with patient and his siter was updated at bedside again Patient status is: Remains hospitalized because of severity of illness Level of care: Progressive transfer to telemetry  Dispo: The patient is from: HOME alone.            Anticipated disposition: TBD Objective: Vitals last 24 hrs: Vitals:   04/24/24 0455 04/24/24 0505 04/24/24 0716 04/24/24 0920  BP:  91/65 (!) 98/59 100/73  Pulse:  (!) 59 62 (!) 52  Resp: 18 18 (!) 22 20  Temp:  98.3 F (36.8 C) 98.2 F (36.8 C) 98.2 F (36.8 C)  TempSrc:  Oral Oral Oral  SpO2:  100% 97%   Weight: 107.6 kg     Height:       Physical Examination: General exam: AAOX3 HEENT:Oral mucosa moist, Ear/Nose WNL grossly Respiratory system: B/L Clear,no use of accessory muscle Cardiovascular system: S1 & S2 +, No JVD. Gastrointestinal system: Abdomen soft,NT,ND, BS+ Nervous System: Alert, awake, moving all extremities,and following commands. Extremities: extremities warm, leg edema  mild Skin: Warm, no rashes MSK: Normal muscle bulk,tone, power   Medications reviewed:  Scheduled Meds:   sodium chloride    Intravenous Once   buPROPion   300 mg Oral Daily   Chlorhexidine  Gluconate Cloth  6 each Topical Daily   insulin  aspart  0-9 Units Subcutaneous TID WC   midodrine  15 mg Oral TID WC   pantoprazole   40 mg Oral BID   rosuvastatin   20 mg Oral Daily   simethicone   240 mg Oral Once   sodium chloride  flush  10-40 mL Intracatheter Q12H  Continuous Infusions:  DOBUTamine     furosemide  120 mg (04/24/24 0859)  Diet: Diet Order             Diet Heart Room service appropriate? Yes; Fluid consistency: Thin  Diet effective now                    Data Reviewed: I have personally reviewed following labs and imaging studies ( see epic result tab) CBC: Recent Labs  Lab 04/20/24 0435 04/21/24 0319 04/22/24 0404 04/23/24 0445 04/23/24 1058 04/23/24 1059 04/23/24 1216 04/23/24 1906 04/24/24 0500  WBC 7.9 7.9 7.5 7.4  --   --   --   --  7.9  HGB 7.0* 7.6* 8.3* 7.1* 8.2* 7.8* 8.0* 8.5* 8.3*  HCT 21.9* 23.7* 26.0* 22.9* 24.0* 23.0* 25.4* 26.6* 25.8*  MCV 84.9 84.3 84.1 83.9  --   --   --   --  83.2  PLT 410* 396 453* 362  --   --   --   --  332   CMP: Recent Labs  Lab 04/19/24 0447 04/20/24 0435 04/21/24 0319 04/22/24 0404 04/23/24 0445 04/23/24 1058 04/23/24 1059 04/24/24 0500  NA 138 138 135 139 138 139 139 140  K 4.3 3.7 3.8 4.0 3.6 3.7 3.8 3.4*  CL 105 107 102 102 102  --   --  103  CO2 21* 19* 18* 20* 18*  --   --  20*  GLUCOSE 150* 155* 142* 157* 133*  --   --  139*  BUN 73* 76* 77* 71* 70*  --   --  67*  CREATININE 3.97* 4.00* 4.25* 4.49* 4.82*  --   --  5.08*  CALCIUM  8.8* 8.6* 8.9 8.8* 8.9  --   --  9.1  MG 2.3  --   --   --  2.1  --   --  2.0  PHOS 6.2*  --   --   --   --   --   --   --    GFR: Estimated Creatinine Clearance: 22.2 mL/min (A) (by C-G formula based on SCr of 5.08 mg/dL (H)). Recent Labs  Lab 04/18/24 2343 04/19/24 0447 04/23/24 0445  AST 71* 73*  --   ALT 91* 102*  --   ALKPHOS 104 92  --   BILITOT 1.2 1.0  --   PROT 5.6*  5.3*  --   ALBUMIN 2.7* 2.4* 2.9*   No results for input(s): LIPASE, AMYLASE in the last 168 hours. No results for input(s): AMMONIA in the last 168 hours. Coagulation Profile:  Recent Labs  Lab 04/17/24 2253  INR 1.6*   Unresulted Labs (From admission, onward)     Start     Ordered   04/23/24 1117  Cooxemetry Panel (carboxy, met, total hgb, O2 sat)  Daily,   R     Question:  Specimen collection method  Answer:  Lab=Lab collect   04/23/24 1116   04/22/24 0500  Basic metabolic panel with GFR  Daily,   R     Question:  Specimen collection method  Answer:  Lab=Lab collect   04/21/24 1022   04/22/24 0500  CBC  Daily,   R     Question:  Specimen collection method  Answer:  Lab=Lab collect   04/21/24 1022           Antimicrobials/Microbiology: Anti-infectives (From admission, onward)    None         Component Value Date/Time   SDES BLOOD LEFT ANTECUBITAL 07/07/2016 1225   SPECREQUEST BOTTLES DRAWN AEROBIC AND ANAEROBIC 5CC 07/07/2016 1225   CULT  07/07/2016 1225    NO GROWTH 5 DAYS Performed at [facility], [address], [place], [place] [address]    REPTSTATUS 07/12/2016 FINAL 07/07/2016 1225    Procedures: Procedure(s) (LRB): RIGHT HEART CATH (N/A)   Mennie LAMY, MD [facility] 04/24/2024, 11:58 AM

## 2024-04-25 DIAGNOSIS — I5043 Acute on chronic combined systolic (congestive) and diastolic (congestive) heart failure: Secondary | ICD-10-CM | POA: Diagnosis not present

## 2024-04-25 DIAGNOSIS — K922 Gastrointestinal hemorrhage, unspecified: Secondary | ICD-10-CM | POA: Diagnosis not present

## 2024-04-25 LAB — BASIC METABOLIC PANEL WITH GFR
Anion gap: 15 (ref 5–15)
BUN: 63 mg/dL — ABNORMAL HIGH (ref 6–20)
CO2: 20 mmol/L — ABNORMAL LOW (ref 22–32)
Calcium: 8.8 mg/dL — ABNORMAL LOW (ref 8.9–10.3)
Chloride: 100 mmol/L (ref 98–111)
Creatinine, Ser: 5.17 mg/dL — ABNORMAL HIGH (ref 0.61–1.24)
GFR, Estimated: 12 mL/min — ABNORMAL LOW (ref >60)
Glucose, Bld: 147 mg/dL — ABNORMAL HIGH (ref 70–99)
Potassium: 3.3 mmol/L — ABNORMAL LOW (ref 3.5–5.1)
Sodium: 135 mmol/L (ref 135–145)

## 2024-04-25 LAB — CBC
HCT: 26 % — ABNORMAL LOW (ref 39.0–52.0)
Hemoglobin: 8.2 g/dL — ABNORMAL LOW (ref 13.0–17.0)
MCH: 26.3 pg (ref 26.0–34.0)
MCHC: 31.5 g/dL (ref 30.0–36.0)
MCV: 83.3 fL (ref 80.0–100.0)
Platelets: 331 K/uL (ref 150–400)
RBC: 3.12 MIL/uL — ABNORMAL LOW (ref 4.22–5.81)
RDW: 16 % — ABNORMAL HIGH (ref 11.5–15.5)
WBC: 7.6 K/uL (ref 4.0–10.5)
nRBC: 0.4 % — ABNORMAL HIGH (ref 0.0–0.2)

## 2024-04-25 LAB — GLUCOSE, CAPILLARY
Glucose-Capillary: 136 mg/dL — ABNORMAL HIGH (ref 70–99)
Glucose-Capillary: 118 mg/dL — ABNORMAL HIGH (ref 70–99)
Glucose-Capillary: 116 mg/dL — ABNORMAL HIGH (ref 70–99)
Glucose-Capillary: 164 mg/dL — ABNORMAL HIGH (ref 70–99)

## 2024-04-25 LAB — COOXEMETRY PANEL
Carboxyhemoglobin: 3 % — ABNORMAL HIGH (ref 0.5–1.5)
Methemoglobin: <0.7 % (ref 0.0–1.5)
O2 Saturation: 66 %
Total hemoglobin: 8.5 g/dL — ABNORMAL LOW (ref 12.0–16.0)

## 2024-04-25 LAB — MAGNESIUM: Magnesium: 1.8 mg/dL (ref 1.7–2.4)

## 2024-04-25 MED ORDER — POTASSIUM CHLORIDE CRYS ER 20 MEQ PO TBCR
20.0000 meq | EXTENDED_RELEASE_TABLET | Freq: Once | ORAL | Status: AC
  Administered 2024-04-25: 20 meq via ORAL

## 2024-04-25 MED ORDER — POTASSIUM CHLORIDE CRYS ER 20 MEQ PO TBCR
20.0000 meq | EXTENDED_RELEASE_TABLET | Freq: Once | ORAL | Status: AC
  Administered 2024-04-25: 20 meq via ORAL
  Filled 2024-04-25: qty 1

## 2024-04-25 MED ORDER — POTASSIUM CHLORIDE CRYS ER 20 MEQ PO TBCR
40.0000 meq | EXTENDED_RELEASE_TABLET | Freq: Once | ORAL | Status: AC
  Administered 2024-04-25: 40 meq via ORAL
  Filled 2024-04-25: qty 2

## 2024-04-25 MED ORDER — IRON SUCROSE 200 MG IVPB - SIMPLE MED
200.0000 mg | Freq: Once | Status: AC
  Administered 2024-04-25: 200 mg via INTRAVENOUS
  Filled 2024-04-25: qty 110

## 2024-04-25 MED ORDER — METOLAZONE 2.5 MG PO TABS
2.5000 mg | ORAL_TABLET | Freq: Once | ORAL | Status: AC
  Administered 2024-04-25: 2.5 mg via ORAL
  Filled 2024-04-25: qty 1

## 2024-04-25 MED ORDER — POTASSIUM CHLORIDE CRYS ER 20 MEQ PO TBCR: 40.0000 meq | EXTENDED_RELEASE_TABLET | ORAL | Status: DC

## 2024-04-25 MED ORDER — MAGNESIUM SULFATE IN D5W 1-5 GM/100ML-% IV SOLN
1.0000 g | Freq: Once | INTRAVENOUS | Status: AC
  Administered 2024-04-25: 1 g via INTRAVENOUS
  Filled 2024-04-25: qty 100

## 2024-04-25 NOTE — Progress Notes (Signed)
 [facility] night cross cover note:   I was notified by the patient's RN that this morning's potassium level is 3.3, down from yesterday's value of 3.4 and this patient is undergoing IV diuresis via Lasix .  I subsequently ordered Kcl 40 meq po x 1 dose now.     Eva Pore, DO Hospitalist

## 2024-04-25 NOTE — Plan of Care (Signed)
  Problem: Clinical Measurements: Goal: Ability to maintain clinical measurements within normal limits will improve Outcome: Progressing Goal: Cardiovascular complication will be avoided Outcome: Progressing   Problem: Coping: Goal: Level of anxiety will decrease Outcome: Progressing   Problem: Pain Managment: Goal: General experience of comfort will improve and/or be controlled Outcome: Progressing   Problem: Fluid Volume: Goal: Ability to maintain a balanced intake and output will improve Outcome: Progressing   Problem: Metabolic: Goal: Ability to maintain appropriate glucose levels will improve Outcome: Progressing

## 2024-04-25 NOTE — Plan of Care (Signed)
  Problem: Clinical Measurements: Goal: Will remain free from infection Outcome: Progressing Goal: Respiratory complications will improve Outcome: Progressing Goal: Cardiovascular complication will be avoided Outcome: Progressing   Problem: Elimination: Goal: Will not experience complications related to bowel motility Outcome: Progressing Goal: Will not experience complications related to urinary retention Outcome: Progressing   Problem: Safety: Goal: Ability to remain free from injury will improve Outcome: Progressing   Problem: Tissue Perfusion: Goal: Adequacy of tissue perfusion will improve Outcome: Progressing

## 2024-04-25 NOTE — Progress Notes (Addendum)
 Keith Leblanc is an 58 y.o. male hypertension, dyslipidemia, T2DM, afib on Eliquis , CHF with grade third diastolic dysfunction/concentric LVH who was presented with symptomatic anemia, acute blood loss anemia seen as a consultation for the evaluation of AKI on CKD. Worsening SOB, DOE, BRBPR. Hypotensive in ED. EGD showed congested friable gastric mucosa likely the source of bleeding and colonoscopy on 10/30 showed hemorrhoids.  He was also managed for acute on chronic CHF exacerbation, EF was around 45% however grade third diastolic dysfunction.  Seen by cardiology team and placed on IV diuretics.    For CKD, patient was seen once at [facility] 2023; recent serum creatinine level fluctuating anywhere around 1.4-2.5. He has hypokalemia, noted albumin level of 2.4. No recent NSAID use and no IV contrast use. Jardiance  which is currently on hold.  He takes furosemide  40 mg at home.  Assessment/Plan: # Acute kidney injury on CKDIIIb-IV ( b/lcr 1.5-2.5): Acute kidney injury likely due to reduced renal perfusion (low effective circulatory volume) caused by persistent hypotension, severe anemia, ongoing use of diuretics for CHF.  Not on ACE inhibitor, ARB and no recent use of IV contrast. Appears to be CRS; hopefully did not develop ATN from hyperperfusion and hypotension.  -Currently on furosemide  120 mg twice daily with dobutamine started 10/31 evening. SABRA  He does have peripheral edema with low serum albumin level. Renal function may be starting to stabilize, much improved UOP since dobutamine started. Not enough time yet and hope renal function will improve with the dobutamine and improve hemodynamics. He is not uremic or dyspneic will follow closely with you.  - Continue with strict ins and out, daily lab.  -Kidney ultrasound neg for r/o obstruction (he does c/o feeling full after voiding); will check a PVR bladder scan as well - Continue to avoid ACE inhibitor, ARB or SGLT 2 inhibitor.   # Acute blood loss  anemia due to GI bleed, symptomatic.  Seen by gastroenterologist and underwent upper and lower GI endoscopy.  Received multiple units of blood transfusion with improvement of hemoglobin.  Back on anticoagulation now. S/p resection of polyps + hemorrhoids but were not actively bldg - Will check iron panel but should have good store after transfusion; ESA after iron panel.   # Persistent hypotension: Holding evening dose of diuretics.  Not on any antihypertensives.  Agree with midodrine and rx with albumin.   # Acute on chronic CHF with EF of 45 to 50%, diastolic dysfunction: Right heart cath 10/31 tomorrow.  Received a dose of IV Lasix  120 mg today and now on dobutamine resulting in much improved UOP   # Metabolic acidosis: CO2 20, trend lab.  Subjective: Denies f/c/n/v/sob; he feels the lower ext edema is much better than previously. He was very down yest with no improvement in renal function but better spirits today.  Co-Ox 66   Chemistry and CBC: Creat  Date/Time Value Ref Range Status  10/23/2023 10:05 AM 1.56 (H) 0.70 - 1.30 mg/dL Final  91/95/7978 90:93 AM 1.37 (H) 0.70 - 1.33 mg/dL Final    Comment:    For patients >56 years of age, the reference limit for Creatinine is approximately 13% higher for people identified as African-American. .    Creatinine, Ser  Date/Time Value Ref Range Status  04/25/2024 05:47 AM 5.17 (H) 0.61 - 1.24 mg/dL Final  88/98/7974 94:99 AM 5.08 (H) 0.61 - 1.24 mg/dL Final  89/68/7974 95:54 AM 4.82 (H) 0.61 - 1.24 mg/dL Final  89/69/7974 95:95 AM 4.49 (H) 0.61 -  1.24 mg/dL Final  89/70/7974 96:80 AM 4.25 (H) 0.61 - 1.24 mg/dL Final  89/71/7974 95:64 AM 4.00 (H) 0.61 - 1.24 mg/dL Final  89/72/7974 95:52 AM 3.97 (H) 0.61 - 1.24 mg/dL Final  89/74/7974 94:95 PM 3.13 (H) 0.61 - 1.24 mg/dL Final  89/98/7974 95:75 PM 2.47 (H) 0.61 - 1.24 mg/dL Final  90/69/7974 88:61 AM 2.42 (H) 0.40 - 1.50 mg/dL Final  90/74/7974 96:42 AM 2.88 (H) 0.61 - 1.24 mg/dL Final   90/75/7974 94:43 AM 3.05 (H) 0.61 - 1.24 mg/dL Final  90/76/7974 95:63 AM 2.79 (H) 0.61 - 1.24 mg/dL Final  90/77/7974 94:53 AM 3.08 (H) 0.61 - 1.24 mg/dL Final  90/78/7974 96:61 AM 3.16 (H) 0.61 - 1.24 mg/dL Final  90/79/7974 95:81 AM 2.63 (H) 0.61 - 1.24 mg/dL Final  90/80/7974 90:57 AM 2.53 (H) 0.61 - 1.24 mg/dL Final  93/73/7974 90:73 AM 1.46 (H) 0.76 - 1.27 mg/dL Final  89/91/7975 91:48 AM 1.60 (H) 0.76 - 1.27 mg/dL Final  93/74/7975 91:57 AM 1.47 0.40 - 1.50 mg/dL Final  94/77/7975 88:99 AM 1.74 (H) 0.40 - 1.50 mg/dL Final  94/80/7975 96:78 AM 1.29 (H) 0.61 - 1.24 mg/dL Final  94/81/7975 96:88 PM 1.37 (H) 0.61 - 1.24 mg/dL Final  94/81/7975 87:54 AM 1.54 (H) 0.61 - 1.24 mg/dL Final  94/82/7975 89:47 PM 1.80 (H) 0.61 - 1.24 mg/dL Final  94/82/7975 89:48 PM 1.66 (H) 0.61 - 1.24 mg/dL Final  96/72/7976 87:41 PM 1.60 (H) 0.40 - 1.50 mg/dL Final  96/77/7976 89:92 AM 1.75 (H) 0.40 - 1.50 mg/dL Final  96/97/7977 90:65 AM 1.41 0.40 - 1.50 mg/dL Final  98/79/7977 97:50 PM 1.35 (H) 0.76 - 1.27 mg/dL Final  90/98/7978 88:53 AM 1.27 0.76 - 1.27 mg/dL Final  87/96/7979 96:86 PM 1.25 0.40 - 1.50 mg/dL Final  87/73/7980 97:82 PM 1.32 (H) 0.61 - 1.24 mg/dL Final  89/88/7980 91:71 AM 1.34 0.40 - 1.50 mg/dL Final  88/84/7981 89:99 AM 1.17 0.40 - 1.50 mg/dL Final  98/76/7981 97:50 PM 1.11 0.40 - 1.50 mg/dL Final  98/83/7981 93:64 AM 1.18 0.61 - 1.24 mg/dL Final  98/84/7981 91:86 AM 1.74 (H) 0.61 - 1.24 mg/dL Final  98/84/7981 87:45 AM 1.78 (H) 0.61 - 1.24 mg/dL Final  98/85/7981 90:80 PM 1.85 (H) 0.61 - 1.24 mg/dL Final  98/85/7981 93:94 PM 2.31 (H) 0.61 - 1.24 mg/dL Final  98/85/7981 89:40 AM 2.76 (H) 0.61 - 1.24 mg/dL Final  88/71/7982 88:88 AM 1.18 0.40 - 1.50 mg/dL Final  87/83/7983 90:63 AM 1.15 0.40 - 1.50 mg/dL Final   Recent Labs  Lab 04/19/24 0447 04/20/24 0435 04/21/24 0319 04/22/24 0404 04/23/24 0445 04/23/24 1058 04/23/24 1059 04/24/24 0500 04/25/24 0547  NA 138 138 135  139 138 139 139 140 135  K 4.3 3.7 3.8 4.0 3.6 3.7 3.8 3.4* 3.3*  CL 105 107 102 102 102  --   --  103 100  CO2 21* 19* 18* 20* 18*  --   --  20* 20*  GLUCOSE 150* 155* 142* 157* 133*  --   --  139* 147*  BUN 73* 76* 77* 71* 70*  --   --  67* 63*  CREATININE 3.97* 4.00* 4.25* 4.49* 4.82*  --   --  5.08* 5.17*  CALCIUM  8.8* 8.6* 8.9 8.8* 8.9  --   --  9.1 8.8*  PHOS 6.2*  --   --   --   --   --   --   --   --  Recent Labs  Lab 04/22/24 0404 04/23/24 0445 04/23/24 1058 04/23/24 1216 04/23/24 1906 04/24/24 0500 04/25/24 0547  WBC 7.5 7.4  --   --   --  7.9 7.6  HGB 8.3* 7.1*   < > 8.0* 8.5* 8.3* 8.2*  HCT 26.0* 22.9*   < > 25.4* 26.6* 25.8* 26.0*  MCV 84.1 83.9  --   --   --  83.2 83.3  PLT 453* 362  --   --   --  332 331   < > = values in this interval not displayed.   Liver Function Tests: Recent Labs  Lab 04/18/24 2343 04/19/24 0447 04/23/24 0445  AST 71* 73*  --   ALT 91* 102*  --   ALKPHOS 104 92  --   BILITOT 1.2 1.0  --   PROT 5.6* 5.3*  --   ALBUMIN 2.7* 2.4* 2.9*   No results for input(s): LIPASE, AMYLASE in the last 168 hours. No results for input(s): AMMONIA in the last 168 hours. Cardiac Enzymes: No results for input(s): CKTOTAL, CKMB, CKMBINDEX, TROPONINI in the last 168 hours. Iron Studies:  Recent Labs    04/24/24 0500  IRON 13*  TIBC 357  FERRITIN 14*   PT/INR: @LABRCNTIP (inr:5)  Xrays/Other Studies: ) Results for orders placed or performed during the hospital encounter of 04/17/24 (from the past 48 hours)  POCT I-Stat EG7     Status: Abnormal   Collection Time: 04/23/24 10:58 AM  Result Value Ref Range   pH, Ven 7.366 7.25 - 7.43   pCO2, Ven 33.2 (L) 44 - 60 mmHg   pO2, Ven 27 (LL) 32 - 45 mmHg   Bicarbonate 19.0 (L) 20.0 - 28.0 mmol/L   TCO2 20 (L) 22 - 32 mmol/L   O2 Saturation 48 %   Acid-base deficit 6.0 (H) 0.0 - 2.0 mmol/L   Sodium 139 135 - 145 mmol/L   Potassium 3.7 3.5 - 5.1 mmol/L   Calcium , Ion 1.21 1.15 -  1.40 mmol/L   HCT 24.0 (L) 39.0 - 52.0 %   Hemoglobin 8.2 (L) 13.0 - 17.0 g/dL   Sample type VENOUS    Comment NOTIFIED PHYSICIAN   POCT I-Stat EG7     Status: Abnormal   Collection Time: 04/23/24 10:59 AM  Result Value Ref Range   pH, Ven 7.367 7.25 - 7.43   pCO2, Ven 33.3 (L) 44 - 60 mmHg   pO2, Ven 26 (LL) 32 - 45 mmHg   Bicarbonate 19.1 (L) 20.0 - 28.0 mmol/L   TCO2 20 (L) 22 - 32 mmol/L   O2 Saturation 46 %   Acid-base deficit 6.0 (H) 0.0 - 2.0 mmol/L   Sodium 139 135 - 145 mmol/L   Potassium 3.8 3.5 - 5.1 mmol/L   Calcium , Ion 1.21 1.15 - 1.40 mmol/L   HCT 23.0 (L) 39.0 - 52.0 %   Hemoglobin 7.8 (L) 13.0 - 17.0 g/dL   Sample type VENOUS    Comment NOTIFIED PHYSICIAN   Type and screen [facility]     Status: None   Collection Time: 04/23/24 12:15 PM  Result Value Ref Range   ABO/RH(D) O POS    Antibody Screen NEG    Sample Expiration 04/26/2024,2359    Unit Number [id number]    Blood Component Type RBC LR PHER2    Unit division 00    Status of Unit ISSUED,FINAL    Transfusion Status OK TO TRANSFUSE    Crossmatch Result  Compatible Performed at [facility], [address], [place], [place] [address]   Hemoglobin and hematocrit, blood     Status: Abnormal   Collection Time: 04/23/24 12:16 PM  Result Value Ref Range   Hemoglobin 8.0 (L) 13.0 - 17.0 g/dL   HCT 74.5 (L) 60.9 - 47.9 %    Comment: Performed at [facility], [address] [place], [place] [address]  Prepare RBC (crossmatch)     Status: None   Collection Time: 04/23/24 12:16 PM  Result Value Ref Range   Order Confirmation      ORDER PROCESSED BY BLOOD BANK Performed at [facility], [address] [place], [place] [address]   Glucose, capillary     Status: Abnormal   Collection Time: 04/23/24  2:40 PM  Result Value Ref Range   Glucose-Capillary 114 (H) 70 - 99 mg/dL    Comment: Glucose reference range applies only to samples taken after fasting for at  least 8 hours.  Glucose, capillary     Status: Abnormal   Collection Time: 04/23/24  4:08 PM  Result Value Ref Range   Glucose-Capillary 114 (H) 70 - 99 mg/dL    Comment: Glucose reference range applies only to samples taken after fasting for at least 8 hours.  Cooxemetry Panel (carboxy, met, total hgb, O2 sat)     Status: Abnormal   Collection Time: 04/23/24  7:02 PM  Result Value Ref Range   Total hemoglobin 8.7 (L) 12.0 - 16.0 g/dL   O2 Saturation 43.8 %   Carboxyhemoglobin 2.9 (H) 0.5 - 1.5 %   Methemoglobin <0.7 0.0 - 1.5 %    Comment: Performed at [facility], [address], [place], [place] [address]  Hemoglobin and hematocrit, blood     Status: Abnormal   Collection Time: 04/23/24  7:06 PM  Result Value Ref Range   Hemoglobin 8.5 (L) 13.0 - 17.0 g/dL   HCT 73.3 (L) 60.9 - 47.9 %    Comment: Performed at [facility], [address] [place], [place] [address]  Glucose, capillary     Status: Abnormal   Collection Time: 04/23/24  9:09 PM  Result Value Ref Range   Glucose-Capillary 167 (H) 70 - 99 mg/dL    Comment: Glucose reference range applies only to samples taken after fasting for at least 8 hours.  Glucose, capillary     Status: Abnormal   Collection Time: 04/24/24  4:58 AM  Result Value Ref Range   Glucose-Capillary 124 (H) 70 - 99 mg/dL    Comment: Glucose reference range applies only to samples taken after fasting for at least 8 hours.  Basic metabolic panel with GFR     Status: Abnormal   Collection Time: 04/24/24  5:00 AM  Result Value Ref Range   Sodium 140 135 - 145 mmol/L   Potassium 3.4 (L) 3.5 - 5.1 mmol/L   Chloride 103 98 - 111 mmol/L   CO2 20 (L) 22 - 32 mmol/L   Glucose, Bld 139 (H) 70 - 99 mg/dL    Comment: Glucose reference range applies only to samples taken after fasting for at least 8 hours.   BUN 67 (H) 6 - 20 mg/dL   Creatinine, Ser 4.91 (H) 0.61 - 1.24 mg/dL   Calcium  9.1 8.9 - 10.3 mg/dL   GFR, Estimated 13 (L) >60 mL/min     Comment: (NOTE) Calculated using the CKD-EPI Creatinine Equation (2021)  Anion gap 17 (H) 5 - 15    Comment: Performed at [facility], [address], [place], [place] [address]  CBC     Status: Abnormal   Collection Time: 04/24/24  5:00 AM  Result Value Ref Range   WBC 7.9 4.0 - 10.5 K/uL   RBC 3.10 (L) 4.22 - 5.81 MIL/uL   Hemoglobin 8.3 (L) 13.0 - 17.0 g/dL   HCT 74.1 (L) 60.9 - 47.9 %   MCV 83.2 80.0 - 100.0 fL   MCH 26.8 26.0 - 34.0 pg   MCHC 32.2 30.0 - 36.0 g/dL   RDW 83.8 (H) 88.4 - 84.4 %   Platelets 332 150 - 400 K/uL   nRBC 0.6 (H) 0.0 - 0.2 %    Comment: Performed at [facility], [address] [place], [place] [address]  Ferritin     Status: Abnormal   Collection Time: 04/24/24  5:00 AM  Result Value Ref Range   Ferritin 14 (L) 24 - 336 ng/mL    Comment: Performed at [facility], [address] [place], [place] [address]  Iron and TIBC     Status: Abnormal   Collection Time: 04/24/24  5:00 AM  Result Value Ref Range   Iron 13 (L) 45 - 182 ug/dL   TIBC 642 749 - 549 ug/dL   Saturation Ratios 4 (L) 17.9 - 39.5 %   UIBC 344 ug/dL    Comment: Performed at [facility], [address] [place], [place] [address]  Magnesium      Status: None   Collection Time: 04/24/24  5:00 AM  Result Value Ref Range   Magnesium  2.0 1.7 - 2.4 mg/dL    Comment: Performed at [facility], [address] [place], [place] [address]  Cooxemetry Panel (carboxy, met, total hgb, O2 sat)     Status: Abnormal   Collection Time: 04/24/24  5:10 AM  Result Value Ref Range   Total hemoglobin 8.9 (L) 12.0 - 16.0 g/dL   O2 Saturation 35.4 %   Carboxyhemoglobin 1.6 (H) 0.5 - 1.5 %   Methemoglobin <0.7 0.0 - 1.5 %    Comment: Performed at [facility], [address], [place], [place] [address]  Glucose, capillary     Status: Abnormal   Collection Time: 04/24/24  6:31 AM  Result Value Ref Range   Glucose-Capillary 146 (H) 70 - 99 mg/dL    Comment:  Glucose reference range applies only to samples taken after fasting for at least 8 hours.  Glucose, capillary     Status: Abnormal   Collection Time: 04/24/24 11:09 AM  Result Value Ref Range   Glucose-Capillary 159 (H) 70 - 99 mg/dL    Comment: Glucose reference range applies only to samples taken after fasting for at least 8 hours.   Comment 1 Notify RN    Comment 2 Document in Chart   Glucose, capillary     Status: None   Collection Time: 04/24/24  3:31 PM  Result Value Ref Range   Glucose-Capillary 95 70 - 99 mg/dL    Comment: Glucose reference range applies only to samples taken after fasting for at least 8 hours.  Glucose, capillary     Status: Abnormal   Collection Time: 04/24/24  8:30 PM  Result Value Ref Range   Glucose-Capillary 183 (H) 70 - 99 mg/dL    Comment: Glucose reference range applies only to samples taken after fasting for  at least 8 hours.  Basic metabolic panel with GFR     Status: Abnormal   Collection Time: 04/25/24  5:47 AM  Result Value Ref Range   Sodium 135 135 - 145 mmol/L   Potassium 3.3 (L) 3.5 - 5.1 mmol/L   Chloride 100 98 - 111 mmol/L   CO2 20 (L) 22 - 32 mmol/L   Glucose, Bld 147 (H) 70 - 99 mg/dL    Comment: Glucose reference range applies only to samples taken after fasting for at least 8 hours.   BUN 63 (H) 6 - 20 mg/dL   Creatinine, Ser 4.82 (H) 0.61 - 1.24 mg/dL   Calcium  8.8 (L) 8.9 - 10.3 mg/dL   GFR, Estimated 12 (L) >60 mL/min    Comment: (NOTE) Calculated using the CKD-EPI Creatinine Equation (2021)    Anion gap 15 5 - 15    Comment: Performed at [facility], [address] [place], [place] [address]  CBC     Status: Abnormal   Collection Time: 04/25/24  5:47 AM  Result Value Ref Range   WBC 7.6 4.0 - 10.5 K/uL   RBC 3.12 (L) 4.22 - 5.81 MIL/uL   Hemoglobin 8.2 (L) 13.0 - 17.0 g/dL   HCT 73.9 (L) 60.9 - 47.9 %   MCV 83.3 80.0 - 100.0 fL   MCH 26.3 26.0 - 34.0 pg   MCHC 31.5 30.0 - 36.0 g/dL   RDW 83.9 (H) 88.4 - 84.4 %    Platelets 331 150 - 400 K/uL   nRBC 0.4 (H) 0.0 - 0.2 %    Comment: Performed at [facility], [address], [place], [place] [address]  Cooxemetry Panel (carboxy, met, total hgb, O2 sat)     Status: Abnormal   Collection Time: 04/25/24  5:47 AM  Result Value Ref Range   Total hemoglobin 8.5 (L) 12.0 - 16.0 g/dL   O2 Saturation 66 %   Carboxyhemoglobin 3.0 (H) 0.5 - 1.5 %   Methemoglobin <0.7 0.0 - 1.5 %    Comment: Performed at [facility], [address], [place], [place] [address]  Glucose, capillary     Status: Abnormal   Collection Time: 04/25/24  6:03 AM  Result Value Ref Range   Glucose-Capillary 136 (H) 70 - 99 mg/dL    Comment: Glucose reference range applies only to samples taken after fasting for at least 8 hours.   DG CHEST PORT 1 VIEW Result Date: 04/23/2024 CLINICAL DATA:  Central line placement EXAM: PORTABLE CHEST 1 VIEW COMPARISON:  Chest x-ray 04/17/2024, 03/12/2024 FINDINGS: Mild cardiomegaly with aortic atherosclerosis. No acute airspace disease, pleural effusion or pneumothorax. Left IJ central venous catheter tip projecting over the SVC origin. No pneumothorax is seen. IMPRESSION: Left IJ central venous catheter tip projects over the SVC origin. No pneumothorax. Mild cardiomegaly Electronically Signed   By: Luke Bun M.D.   On: 04/23/2024 16:46   CARDIAC CATHETERIZATION Result Date: 04/23/2024 1. Elevated right-sided filling pressures out of proportion to mildly elevated PCWP. 2. Mild pulmonary venous hypertension 3. Low cardiac output by thermo though preserved by Fick. 4. Low PAPi Suspect primarily RV dysfunction.  Will start dobutamine 2.5 for RV support.    PMH:   Past Medical History:  Diagnosis Date   Blood in stool    bright red blood    Chicken pox    Diabetes mellitus (HCC)    Erectile dysfunction    Gout    Hypertension  Stroke [facility])     PSH:   Past Surgical History:  Procedure Laterality Date   APPENDECTOMY  2004    COLONOSCOPY     COLONOSCOPY N/A 04/22/2024   Procedure: COLONOSCOPY;  Surgeon: Stacia Glendia BRAVO, MD;  Location: [facility] ENDOSCOPY;  Service: Gastroenterology;  Laterality: N/A;   ENDOMYOCARDIAL BIOPSY N/A 03/16/2024   Procedure: ENDOMYOCARDIAL BIOPSY;  Surgeon: Zenaida Morene PARAS, MD;  Location: [facility] INVASIVE CV LAB;  Service: Cardiovascular;  Laterality: N/A;   ESOPHAGOGASTRODUODENOSCOPY N/A 04/19/2024   Procedure: EGD (ESOPHAGOGASTRODUODENOSCOPY);  Surgeon: Stacia Glendia BRAVO, MD;  Location: [facility] ENDOSCOPY;  Service: Gastroenterology;  Laterality: N/A;   RIGHT HEART CATH N/A 03/16/2024   Procedure: RIGHT HEART CATH;  Surgeon: Zenaida Morene PARAS, MD;  Location: [facility] INVASIVE CV LAB;  Service: Cardiovascular;  Laterality: N/A;   RIGHT HEART CATH N/A 04/23/2024   Procedure: RIGHT HEART CATH;  Surgeon: Rolan Ezra RAMAN, MD;  Location: [facility] INVASIVE CV LAB;  Service: Cardiovascular;  Laterality: N/A;   TOOTH EXTRACTION  07/06/2020    Allergies: No Known Allergies  Medications:   Prior to Admission medications   Medication Sig Start Date End Date Taking? Authorizing Provider  apixaban  (ELIQUIS ) 5 MG TABS tablet Take 1 tablet (5 mg total) by mouth 2 (two) times daily. 04/08/24  Yes Hayes Beckey CROME, NP  bisoprolol  (ZEBETA ) 10 MG tablet Take 2 tablets (20 mg total) by mouth daily. 04/08/24  Yes Hayes Beckey CROME, NP  buPROPion  (WELLBUTRIN  XL) 300 MG 24 hr tablet TAKE 1 TABLET BY MOUTH ONCE  DAILY 04/13/24  Yes Nafziger, Darleene, NP  colchicine  0.6 MG tablet TAKE 1 TABLET BY MOUTH DAILY  WHEN HAVING FLARES TAKE 1 TABLET BY MOUTH TWICE DAILY Patient taking differently: Take 0.6 mg by mouth as needed. TAKE 1 TABLET BY MOUTH  DAILY WHEN HAVING FLARES  TAKE 1 TABLET BY MOUTH  TWICE DAILY 09/05/23  Yes Nafziger, Darleene, NP  empagliflozin  (JARDIANCE ) 10 MG TABS tablet Take 1 tablet (10 mg total) by mouth daily. 04/08/24  Yes Hayes Beckey CROME, NP  furosemide  (LASIX ) 20 MG tablet Take 2 tablets (40 mg total) by mouth as needed (For  increased swelling, weight gain of 3 LB in 1 day or 5 LB in 1 week). 04/08/24  Yes Hayes Beckey CROME, NP  hydrOXYzine (VISTARIL) 50 MG capsule Take 1 capsule (50 mg total) by mouth every 8 (eight) hours as needed. Patient taking differently: Take 50 mg by mouth every 8 (eight) hours as needed for itching. 03/23/24  Yes Nafziger, Darleene, NP  magnesium  oxide (MAG-OX) 400 (240 Mg) MG tablet TAKE 1 TABLET(400 MG) BY MOUTH DAILY 02/12/24  Yes West, Katlyn D, NP  Multiple Vitamins-Minerals (ONE-A-DAY MENS 50+) TABS Take 1 tablet by mouth daily with breakfast.   Yes [provider]  omeprazole  (PRILOSEC) 40 MG capsule TAKE 1 CAPSULE BY MOUTH DAILY Patient taking differently: Take 40 mg by mouth daily before breakfast. 11/12/23  Yes Nafziger, Darleene, NP  potassium chloride  SA (KLOR-CON  M) 20 MEQ tablet Take 2 tablets (40 mEq total) by mouth daily as needed (take with lasix  as needed dose). 04/08/24  Yes Hayes Beckey CROME, NP  rosuvastatin  (CRESTOR ) 20 MG tablet Take 1 tablet (20 mg total) by mouth daily. 04/08/24  Yes Hayes Beckey CROME, NP  thiamine  (VITAMIN B-1) 100 MG tablet Take 1 tablet (100 mg total) by mouth daily. 11/11/22  Yes Toberman, Stevi W, NP  triamcinolone cream (KENALOG) 0.5 % Apply 1 Application topically 2 (two) times daily.  03/23/24  Yes Nafziger, Darleene, NP  blood glucose meter kit and supplies Dispense based on patient and insurance preference. Use up to four times daily as directed. (FOR ICD-9 250.00, 250.01). 07/09/16   Sebastian Toribio GAILS, MD  glucose blood (ONETOUCH ULTRA TEST) test strip Use to check blood sugar 2-3 times a day. 03/01/24   Trixie File, MD  Insulin  Pen Needle (B-D UF III MINI PEN NEEDLES) 31G X 5 MM MISC USE 4 TIMES DAILY AFTER MEALS AND AT BEDTIME 02/24/20   Trixie File, MD  Lancets [facility] ULTRASOFT) lancets Test blood sugars as directed. Dx E11.9 08/06/16   Nafziger, Cory, NP  nicotine  (NICODERM CQ  - DOSED IN MG/24 HR) 7 mg/24hr patch Place 1 patch (7 mg total) onto the skin  daily. Patient not taking: Reported on 04/17/2024 03/19/24   Fairy Frames, MD  potassium chloride  (KLOR-CON ) 10 MEQ tablet TAKE 1 TABLET(10 MEQ) BY MOUTH DAILY Patient not taking: Reported on 04/17/2024 04/08/24   Hayes Beckey CROME, NP    Discontinued Meds:   Medications Discontinued During This Encounter  Medication Reason   CLEAR EYES NATURAL TEARS 5-6 MG/ML SOLN No longer needed (for PRN medications)   allopurinol  (ZYLOPRIM ) 300 MG tablet Patient Preference   apixaban  (ELIQUIS ) tablet 5 mg    midodrine (PROAMATINE) tablet 10 mg    pantoprazole  (PROTONIX ) injection 40 mg P&T Policy: IV to PO Conversion   Na Sulfate-K Sulfate-Mg Sulfate concentrate (SUPREP) kit 177 mL    Na Sulfate-K Sulfate-Mg Sulfate concentrate (SUPREP) kit 177 mL    simethicone  (MYLICON) chewable tablet 240 mg    simethicone  (MYLICON) chewable tablet 240 mg    sodium chloride  0.9 % bolus 500 mL    midodrine (PROAMATINE) tablet 10 mg    furosemide  (LASIX ) 120 mg in dextrose  5 % 50 mL IVPB    DOBUTamine (DOBUTREX) infusion 4000 mcg/mL    furosemide  (LASIX ) 120 mg in dextrose  5 % 50 mL IVPB    Heparin  (Porcine) in NaCl 1000-0.9 UT/500ML-% SOLN Patient Transfer   lidocaine  (PF) (XYLOCAINE ) 1 % injection Patient Transfer   potassium chloride  SA (KLOR-CON  M) CR tablet 40 mEq     Social History:  reports that he quit smoking about 6 weeks ago. His smoking use included cigarettes and cigars. He has never used smokeless tobacco. He reports that he does not currently use alcohol. He reports that he does not currently use drugs after having used the following drugs: Marijuana.  Family History:   Family History  Problem Relation Age of Onset   Alcohol abuse Father    Hypertension Father    Heart disease Father    Gout Father    Breast cancer Mother    Hypertension Mother    Gout Mother    Healthy Sister    Healthy Daughter    Colon cancer Neg Hx    Esophageal cancer Neg Hx    Rectal cancer Neg Hx    Stomach cancer  Neg Hx     Blood pressure (P) 95/63, pulse (P) 64, temperature (P) 98.2 F (36.8 C), temperature source (P) Oral, resp. rate 19, height 6' 5 (1.956 m), weight 106.5 kg, SpO2 (P) 99%. Physical Exam: General exam: Appears calm and comfortable  Respiratory system: CTA b/l, no rales Cardiovascular system: S1 & S2 heard, RRR.   Gastrointestinal system: Abdomen is nondistended, soft and nontender. Normal bowel sounds heard. Central nervous system: Alert and oriented. No focal neurological deficits. Extremities: LE edema  Skin: No rashes, lesions  or ulcers     Antwon Rochin, LYNWOOD ORN, MD 04/25/2024, 9:24 AM

## 2024-04-25 NOTE — Progress Notes (Signed)
 PROGRESS NOTE Keith Leblanc  FMW:[id number] DOB: October 19, 1965 DOA: 04/17/2024 PCP: [name], NP  Brief Narrative/Hospital Course: Keith Leblanc is a 58 y.o. male with PMH of of prior stroke, hypertension, diabetes who presented to the ED due to shortness of breath w/ progressive worsening over the last week and noticed bright red blood in stool , he in ED BP soft, labs showed bicarb 21, creatinine 3.1 baseline around 3, WBC 8.0, hemoglobin 7.5 baseline around 12, troponin 59, 54, INR 1.6.   CXR>>no acute changes.GI was consulted and started on IV PPI BID.  S/P 1 unit of packed RBCs for Hg 6.8. His Eliquis  was held. Last colonoscopy in 2019 which showed diverticulosis and multiple polyps.  Patient endorsed improving shortness of breath following transfusion no chest pain. Underwent EGD 10/27 has congested friable gastric mucosa likely the source of bleeding  10/30 colonoscopy showed hemorrhoids likely cause of patient's blood in the stool Patient having ongoing AKI nephro cardio following 10/31 underwent RHC, and had central line placement-patient was started on dobutamine drip 1. Elevated right-sided filling pressures out of proportion to mildly elevated PCWP.  Mild pulmonary venous hypertension Low cardiac output by thermo though preserved by Fick. Low PAPi>S uspect primarily RV dysfunction> started dobutamine 2.5 for RV support.  Subjective: Seen and examined On the edge of the bed cleaning himself no complaints Leg swelling much better Overnight BP remains soft as low as 80s mostly 90s, afebrile Labs with creatinine at 5.0> 5.17, bun 67>63, potassium 3.3, patient had nonsustained V. tach 19 beats With on admission 232> 237> 234 Urine output peaked  950/cc previously-> past 24 hrs 2320 cc  Assessment and plan:  ABLA due to upper GI bleed with friable congested gastric mucosa Hematochezia due to internal hemorrhoids:  history of LA grade a reflux esophagitis and gastritis seen on  EGD done in 2019. S/p EGD 10/27: congested friable gastric mucosa likely the source of bleeding.  S/p Colonoscopy 10/30:3 to 5 mm polyps in transverse colon removed, nonbleeding internal hemorrhoids noticed likely cause of hematochezia but doubt it accounts for patient's drop in hemoglobin.So far received 3 units PRBC hemoglobin remained stable above 8 g, continue to monitor and transfuse if less than 8 Continue to monitor hemoglobin. Eliquis  resumed 11/1 after GI clearance, cont ppi  Recent Labs    04/23/24 1059 04/23/24 1216 04/23/24 1906 04/24/24 0500 04/25/24 0547  HGB 7.8* 8.0* 8.5* 8.3* 8.2*  MCV  --   --   --  83.2 83.3  FERRITIN  --   --   --  14*  --   TIBC  --   --   --  357  --   IRON  --   --   --  13*  --    Acute on chronic CHF with midrange EF 43% RV dysfunction HCM Hypertension history Hypotension-persistent Nonsustained V. tach: Prior to admission on GDMT. Placed on midodrine, GDMT held, midodrine dose increased to 15mg  tid Continue to hold antihypertensives and GDMT-advanced heart failure team following repeat TTE- EF 40-45% global SMK severe left ventricular hypertrophy G3 DD.  On high-dose Lasix  120mg  bid  S/p RHC 10/31, and central line placement and placed on dobutamine drip as suspecting primary RV dysfunction Continue plan for diuretics/dobutamine drip as per advanced heart failure team.  Urine output picked up very well past 24 hr Monitor daily I/O,weight, electrolytes and net balance as below.Keep on  salt/fluid restricted diet and monitor in tele. Net IO Since Admission: 2,202.87 mL [04/25/24  0834]  Filed Weights   04/23/24 0500 04/24/24 0455 04/25/24 0500  Weight: 105.2 kg 107.6 kg 106.5 kg    AKI on CKD stage V Metabolic acidosis Hypokalemia: Creat b/l 1.5-2.5.likely multifactorial AKI on CKD due to GI bleeding, severe anemia, hypotension,RV dysfunction and ned for diuretics Nephro has been consulted creatinine continues to worsen- rather flat  now On high-dose Lasix  per advanced heart failure team. Continue diuresis/Iv albumin as per nephrology/cardio- Monitor urine output, renal function as below- UOP picked up Replace potassium, and magnesium  given NSVT Recent Labs    03/23/24 1138 03/24/24 1624 04/17/24 1704 04/19/24 0447 04/20/24 0435 04/21/24 0319 04/22/24 0404 04/23/24 0445 04/23/24 1058 04/23/24 1059 04/24/24 0500 04/25/24 0547  BUN 27* 31* 57* 73* 76* 77* 71* 70*  --   --  67* 63*  CREATININE 2.42* 2.47* 3.13* 3.97* 4.00* 4.25* 4.49* 4.82*  --   --  5.08* 5.17*  CO2 30 25 21* 21* 19* 18* 20* 18*  --   --  20* 20*  K 4.4 4.3 4.4 4.3 3.7 3.8 4.0 3.6 3.7 3.8 3.4* 3.3*   Intake/Output Summary (Last 24 hours) at 04/25/2024 0834 Last data filed at 04/25/2024 9378 Gross per 24 hour  Intake 716.1 ml  Output 2320 ml  Net -1603.9 ml    Chronic depression: Mood stable, continue home bupropion    Elevated troponin: Due to demand ischemia.  Plan per cardiology  Hyperlipidemia: continue home rosuvastatin    Persistent A-fib: Eliquis  resumed 11/1  DVT prophylaxis: Place TED hose Start: 04/21/24 1237 SCDs Start: 04/17/24 2333 Code Status:   Code Status: Full Code Family Communication: plan of care discussed with patient and his siter was updated at bedside again Patient status is: Remains hospitalized because of severity of illness Level of care: Progressive transfer to telemetry  Dispo: The patient is from: HOME alone.            Anticipated disposition: TBD Objective: Vitals last 24 hrs: Vitals:   04/25/24 0005 04/25/24 0500 04/25/24 0545 04/25/24 0714  BP: 95/68   (P) 95/63  Pulse: 62   (P) 64  Resp: 19     Temp: 98.1 F (36.7 C)  98.6 F (37 C) (P) 98.2 F (36.8 C)  TempSrc: Oral  Oral (P) Oral  SpO2: 99%   (P) 99%  Weight:  106.5 kg    Height:       Physical Examination: General exam: AAOX3, anxious, pleasant HEENT:Oral mucosa moist, Ear/Nose WNL grossly Respiratory system: B/L Clear,no use of  accessory muscle Cardiovascular system: S1 & S2 +, No JVD. Gastrointestinal system: Abdomen soft,NT,ND, BS+ Nervous System: Alert, awake, moving all extremities Extremities: extremities warm, leg edema trace Skin: Warm, no rashes MSK: Normal muscle bulk,tone, power   Medications reviewed:  Scheduled Meds:  sodium chloride    Intravenous Once   apixaban   5 mg Oral BID   buPROPion   300 mg Oral Daily   Chlorhexidine  Gluconate Cloth  6 each Topical Daily   insulin  aspart  0-9 Units Subcutaneous TID WC   midodrine  15 mg Oral TID WC   pantoprazole   40 mg Oral BID   potassium chloride   20 mEq Oral Once   rosuvastatin   20 mg Oral Daily   simethicone   240 mg Oral Once   sodium chloride  flush  10-40 mL Intracatheter Q12H  Continuous Infusions:  DOBUTamine     furosemide  120 mg (04/24/24 1910)  Diet: Diet Order  Diet Heart Room service appropriate? Yes; Fluid consistency: Thin  Diet effective now                    Data Reviewed: I have personally reviewed following labs and imaging studies ( see epic result tab) CBC: Recent Labs  Lab 04/21/24 0319 04/22/24 0404 04/23/24 0445 04/23/24 1058 04/23/24 1059 04/23/24 1216 04/23/24 1906 04/24/24 0500 04/25/24 0547  WBC 7.9 7.5 7.4  --   --   --   --  7.9 7.6  HGB 7.6* 8.3* 7.1*   < > 7.8* 8.0* 8.5* 8.3* 8.2*  HCT 23.7* 26.0* 22.9*   < > 23.0* 25.4* 26.6* 25.8* 26.0*  MCV 84.3 84.1 83.9  --   --   --   --  83.2 83.3  PLT 396 453* 362  --   --   --   --  332 331   < > = values in this interval not displayed.   CMP: Recent Labs  Lab 04/19/24 0447 04/20/24 0435 04/21/24 0319 04/22/24 0404 04/23/24 0445 04/23/24 1058 04/23/24 1059 04/24/24 0500 04/25/24 0547 04/25/24 0900  NA 138   < > 135 139 138 139 139 140 135  --   K 4.3   < > 3.8 4.0 3.6 3.7 3.8 3.4* 3.3*  --   CL 105   < > 102 102 102  --   --  103 100  --   CO2 21*   < > 18* 20* 18*  --   --  20* 20*  --   GLUCOSE 150*   < > 142* 157* 133*  --   --   139* 147*  --   BUN 73*   < > 77* 71* 70*  --   --  67* 63*  --   CREATININE 3.97*   < > 4.25* 4.49* 4.82*  --   --  5.08* 5.17*  --   CALCIUM  8.8*   < > 8.9 8.8* 8.9  --   --  9.1 8.8*  --   MG 2.3  --   --   --  2.1  --   --  2.0  --  1.8  PHOS 6.2*  --   --   --   --   --   --   --   --   --    < > = values in this interval not displayed.   GFR: Estimated Creatinine Clearance: 20.1 mL/min (A) (by C-G formula based on SCr of 5.17 mg/dL (H)). Recent Labs  Lab 04/18/24 2343 04/19/24 0447 04/23/24 0445  AST 71* 73*  --   ALT 91* 102*  --   ALKPHOS 104 92  --   BILITOT 1.2 1.0  --   PROT 5.6* 5.3*  --   ALBUMIN 2.7* 2.4* 2.9*   No results for input(s): LIPASE, AMYLASE in the last 168 hours. No results for input(s): AMMONIA in the last 168 hours. Coagulation Profile:  No results for input(s): INR, PROTIME in the last 168 hours.  Unresulted Labs (From admission, onward)     Start     Ordered   04/23/24 1117  Cooxemetry Panel (carboxy, met, total hgb, O2 sat)  Daily,   R     Question:  Specimen collection method  Answer:  Lab=Lab collect   04/23/24 1116   04/22/24 0500  Basic metabolic panel with GFR  Daily,   R     Question:  Specimen  collection method  Answer:  Lab=Lab collect   04/21/24 1022   04/22/24 0500  CBC  Daily,   R     Question:  Specimen collection method  Answer:  Lab=Lab collect   04/21/24 1022           Antimicrobials/Microbiology: Anti-infectives (From admission, onward)    None         Component Value Date/Time   SDES BLOOD LEFT ANTECUBITAL 07/07/2016 1225   SPECREQUEST BOTTLES DRAWN AEROBIC AND ANAEROBIC 5CC 07/07/2016 1225   CULT  07/07/2016 1225    NO GROWTH 5 DAYS Performed at [facility], [address] [place], [place] [address]    REPTSTATUS 07/12/2016 FINAL 07/07/2016 1225    Procedures: Procedure(s) (LRB): RIGHT HEART CATH (N/A)   Mennie LAMY, MD [facility] 04/25/2024, 11:30 AM

## 2024-04-25 NOTE — Progress Notes (Signed)
 Patient ID: Keith Leblanc, male   DOB: October 18, 1965, 58 y.o.   MRN: [id number]     Advanced Heart Failure Rounding Note  Cardiologist: None  Chief Complaint: A/c HFmrEF, GIB   Patient Profile   58 y/o male w/ prior h/o heavy ETOH use (recently quit), HFmrEF, CKDIIIa, PAF on Eliquis , HTN, HLD and Type 2DM, admitted w/ symptomatic ABLA 2/2 GIB. HF management c/b persistent hypotension and AKI despite adequate volume resuscitation. AHF team asked to assist.   Subjective:    Echo this admit EF 40-45% (previously 45-50%), severe LVH, RV mod reduced, RVSP 42, dilated IVC estimated RAP ~15 mmHg   BUN/creatinine stable today, 63/5.17.  UOP improved on dobutamine 2.5, I/Os net negative 1604 cc and weight down.  Co-ox 66%.   Hgb stable 8.2, transferrin saturation 4%.   SBP 90s-100s, still on midodrine 15 mg tid.   Objective:   Weight Range: 106.5 kg Body mass index is 27.84 kg/m.   Vital Signs:   Temp:  [98.1 F (36.7 C)-98.6 F (37 C)] 98.2 F (36.8 C) (11/02 1156) Pulse Rate:  [60-68] 66 (11/02 1156) Resp:  [19-20] 20 (11/02 1156) BP: (85-107)/(59-70) 107/70 (11/02 1156) SpO2:  [98 %-99 %] 98 % (11/02 1156) Weight:  [106.5 kg] 106.5 kg (11/02 0500) Last BM Date : 04/24/24  Weight change: Filed Weights   04/23/24 0500 04/24/24 0455 04/25/24 0500  Weight: 105.2 kg 107.6 kg 106.5 kg    Intake/Output:   Intake/Output Summary (Last 24 hours) at 04/25/2024 1339 Last data filed at 04/25/2024 1243 Gross per 24 hour  Intake 946.1 ml  Output 2820 ml  Net -1873.9 ml      Physical Exam   General: NAD Neck: JVP 12-14 cm, no thyromegaly or thyroid  nodule.  Lungs: Clear to auscultation bilaterally with normal respiratory effort. CV: Nondisplaced PMI.  Heart irregular S1/S2, no S3/S4, no murmur. 1+ ankle edema.  Abdomen: Soft, nontender, no hepatosplenomegaly, no distention.  Skin: Intact without lesions or rashes.  Neurologic: Alert and oriented x 3.  Psych: Normal  affect. Extremities: No clubbing or cyanosis.  HEENT: Normal.   EKG   Atrial fibrillation 60s (personally reviewed)  Labs    CBC Recent Labs    04/24/24 0500 04/25/24 0547  WBC 7.9 7.6  HGB 8.3* 8.2*  HCT 25.8* 26.0*  MCV 83.2 83.3  PLT 332 331   Basic Metabolic Panel Recent Labs    88/98/74 0500 04/25/24 0547 04/25/24 0900  NA 140 135  --   K 3.4* 3.3*  --   CL 103 100  --   CO2 20* 20*  --   GLUCOSE 139* 147*  --   BUN 67* 63*  --   CREATININE 5.08* 5.17*  --   CALCIUM  9.1 8.8*  --   MG 2.0  --  1.8   Liver Function Tests Recent Labs    04/23/24 0445  ALBUMIN 2.9*   No results for input(s): LIPASE, AMYLASE in the last 72 hours. Cardiac Enzymes No results for input(s): CKTOTAL, CKMB, CKMBINDEX, TROPONINI in the last 72 hours.  BNP: BNP (last 3 results) Recent Labs    03/24/24 1624 04/17/24 1704  BNP 1,093.8* 1,324.0*    ProBNP (last 3 results) Recent Labs    03/12/24 0942  PROBNP 5,364.0*     D-Dimer No results for input(s): DDIMER in the last 72 hours. Hemoglobin A1C No results for input(s): HGBA1C in the last 72 hours. Fasting Lipid Panel No results for input(s): CHOL, HDL,  LDLCALC, TRIG, CHOLHDL, LDLDIRECT in the last 72 hours. Thyroid  Function Tests No results for input(s): TSH, T4TOTAL, T3FREE, THYROIDAB in the last 72 hours.  Invalid input(s): FREET3  Other results:    Medications:     Scheduled Medications:  sodium chloride    Intravenous Once   apixaban   5 mg Oral BID   buPROPion   300 mg Oral Daily   Chlorhexidine  Gluconate Cloth  6 each Topical Daily   insulin  aspart  0-9 Units Subcutaneous TID WC   midodrine  15 mg Oral TID WC   pantoprazole   40 mg Oral BID   rosuvastatin   20 mg Oral Daily   simethicone   240 mg Oral Once   sodium chloride  flush  10-40 mL Intracatheter Q12H    Infusions:  DOBUTamine     furosemide  120 mg (04/25/24 0847)   iron sucrose     magnesium  sulfate  bolus IVPB 1 g (04/25/24 1243)    PRN Medications: acetaminophen  **OR** acetaminophen , ondansetron  **OR** ondansetron  (ZOFRAN ) IV, sodium chloride  flush, traMADol      Assessment/Plan   1. Acute on chronic HF with mid range EF: Echo in 8/25 showed EF 45-50%, severe LVH, abnormal RV free wall strain, ~17%, RV mildly reduced.  Cardiac MRI showed moderate concentric LVH with EF 43%, RV EF 47%, ECV 36%, diffuse patchy LGE throughout LV myocardium. PYP scan negative.  He had an endomyocardial biopsy that was negative for cardiac amyloidosis. Suspect hypertrophic cardiomyopathy. BUN/creatinine remain elevated but stable today with predominant RV failure and elevated CVP, 13-14.  Co-ox 66% on dobutamine 2.5.  - Continue lasix  120mg  IV BID, will give metolazone 2.5 x 1 with next dose.  - Continue dobutamine 2.5 - Continue current midodrine for now to maintain MAP.   2. AKI on CKD stage 3b: Renal US  negative. ?ATN in setting of hypotension from GI bleeding.  Creatinine/BUN are stable today at 5.17/63.  - nephrology now following, conducting w/u to exclude nephrotic syndrome  - Multiple Myeloma panel 9/25 negative for M-spike protein   3. Atrial fibrillation: Paroxysmal. In rate controlled afib this admit  - Eliquis  restarted with resolution of overt GI bleeding.  - can plan outpatient DCCV after back on Eliquis  x 3 wks   4. GI bleeding: Has had transfusions.  EGD with gastritis.  Colonoscopy w/ 2 polyps (resected) and non bleeding hemorrhoids. - no further gross bleeding  - Transferrin saturation 4%, will give IV Fe.  - Hgb stable at 8.2 - Back on apixaban .   5. ETOH abuse.  - he reports he quit ~6 wks ago    Length of Stay: 8  Ezra Shuck, MD  04/25/2024, 1:39 PM

## 2024-04-26 ENCOUNTER — Telehealth: Payer: Self-pay | Admitting: Pharmacy Technician

## 2024-04-26 ENCOUNTER — Telehealth (HOSPITAL_COMMUNITY): Payer: Self-pay | Admitting: Internal Medicine

## 2024-04-26 ENCOUNTER — Ambulatory Visit: Admitting: Internal Medicine

## 2024-04-26 ENCOUNTER — Ambulatory Visit (HOSPITAL_COMMUNITY): Payer: Self-pay | Admitting: Internal Medicine

## 2024-04-26 DIAGNOSIS — I5043 Acute on chronic combined systolic (congestive) and diastolic (congestive) heart failure: Secondary | ICD-10-CM | POA: Diagnosis not present

## 2024-04-26 DIAGNOSIS — D509 Iron deficiency anemia, unspecified: Secondary | ICD-10-CM | POA: Insufficient documentation

## 2024-04-26 LAB — BASIC METABOLIC PANEL WITH GFR
Anion gap: 14 (ref 5–15)
Anion gap: 15 (ref 5–15)
BUN: 57 mg/dL — ABNORMAL HIGH (ref 6–20)
BUN: 55 mg/dL — ABNORMAL HIGH (ref 6–20)
CO2: 25 mmol/L (ref 22–32)
CO2: 25 mmol/L (ref 22–32)
Calcium: 9.1 mg/dL (ref 8.9–10.3)
Calcium: 9.2 mg/dL (ref 8.9–10.3)
Chloride: 100 mmol/L (ref 98–111)
Chloride: 98 mmol/L (ref 98–111)
Creatinine, Ser: 4.82 mg/dL — ABNORMAL HIGH (ref 0.61–1.24)
Creatinine, Ser: 4.88 mg/dL — ABNORMAL HIGH (ref 0.61–1.24)
GFR, Estimated: 13 mL/min — ABNORMAL LOW (ref >60)
GFR, Estimated: 13 mL/min — ABNORMAL LOW (ref >60)
Glucose, Bld: 119 mg/dL — ABNORMAL HIGH (ref 70–99)
Glucose, Bld: 101 mg/dL — ABNORMAL HIGH (ref 70–99)
Potassium: 3 mmol/L — ABNORMAL LOW (ref 3.5–5.1)
Potassium: 3.1 mmol/L — ABNORMAL LOW (ref 3.5–5.1)
Sodium: 139 mmol/L (ref 135–145)
Sodium: 138 mmol/L (ref 135–145)

## 2024-04-26 LAB — GLUCOSE, CAPILLARY
Glucose-Capillary: 120 mg/dL — ABNORMAL HIGH (ref 70–99)
Glucose-Capillary: 132 mg/dL — ABNORMAL HIGH (ref 70–99)
Glucose-Capillary: 97 mg/dL (ref 70–99)
Glucose-Capillary: 165 mg/dL — ABNORMAL HIGH (ref 70–99)

## 2024-04-26 LAB — MAGNESIUM: Magnesium: 2 mg/dL (ref 1.7–2.4)

## 2024-04-26 LAB — CBC
HCT: 24.4 % — ABNORMAL LOW (ref 39.0–52.0)
Hemoglobin: 7.8 g/dL — ABNORMAL LOW (ref 13.0–17.0)
MCH: 26.4 pg (ref 26.0–34.0)
MCHC: 32 g/dL (ref 30.0–36.0)
MCV: 82.7 fL (ref 80.0–100.0)
Platelets: 312 K/uL (ref 150–400)
RBC: 2.95 MIL/uL — ABNORMAL LOW (ref 4.22–5.81)
RDW: 15.9 % — ABNORMAL HIGH (ref 11.5–15.5)
WBC: 6 K/uL (ref 4.0–10.5)
nRBC: 0.3 % — ABNORMAL HIGH (ref 0.0–0.2)

## 2024-04-26 LAB — COOXEMETRY PANEL
Carboxyhemoglobin: 2.1 % — ABNORMAL HIGH (ref 0.5–1.5)
Methemoglobin: <0.7 % (ref 0.0–1.5)
O2 Saturation: 72.9 %
Total hemoglobin: 8.3 g/dL — ABNORMAL LOW (ref 12.0–16.0)

## 2024-04-26 MED ORDER — METOLAZONE 2.5 MG PO TABS
2.5000 mg | ORAL_TABLET | Freq: Once | ORAL | Status: AC
  Administered 2024-04-26: 2.5 mg via ORAL
  Filled 2024-04-26: qty 1

## 2024-04-26 MED ORDER — ALUM & MAG HYDROXIDE-SIMETH 200-200-20 MG/5ML PO SUSP
30.0000 mL | Freq: Four times a day (QID) | ORAL | Status: DC | PRN
  Administered 2024-04-26: 30 mL via ORAL
  Filled 2024-04-26: qty 30

## 2024-04-26 MED ORDER — POTASSIUM CHLORIDE CRYS ER 20 MEQ PO TBCR: 40.0000 meq | EXTENDED_RELEASE_TABLET | ORAL | Status: DC

## 2024-04-26 MED ORDER — IRON SUCROSE 200 MG IVPB - SIMPLE MED
200.0000 mg | Status: DC
  Administered 2024-04-26: 200 mg via INTRAVENOUS
  Filled 2024-04-26: qty 200
  Filled 2024-04-26: qty 110

## 2024-04-26 MED ORDER — POTASSIUM CHLORIDE CRYS ER 20 MEQ PO TBCR
40.0000 meq | EXTENDED_RELEASE_TABLET | Freq: Once | ORAL | Status: AC
  Administered 2024-04-26: 40 meq via ORAL
  Filled 2024-04-26: qty 2

## 2024-04-26 MED ORDER — POTASSIUM CHLORIDE CRYS ER 20 MEQ PO TBCR
40.0000 meq | EXTENDED_RELEASE_TABLET | Freq: Two times a day (BID) | ORAL | Status: AC
  Administered 2024-04-26 – 2024-04-27 (×4): 40 meq via ORAL
  Filled 2024-04-26 (×4): qty 2

## 2024-04-26 NOTE — Progress Notes (Signed)
 PROGRESS NOTE Keith Leblanc  FMW:[id number] DOB: Oct 30, 1965 DOA: 04/17/2024 PCP: [name], NP  Brief Narrative/Hospital Course: Keith Leblanc is a 58 y.o. male with PMH of of prior stroke, hypertension, diabetes who presented to the ED due to shortness of breath w/ progressive worsening over the last week and noticed bright red blood in stool , he in ED BP soft, labs showed bicarb 21, creatinine 3.1 baseline around 3, WBC 8.0, hemoglobin 7.5 baseline around 12, troponin 59, 54, INR 1.6.   CXR>>no acute changes.GI was consulted and started on IV PPI BID.  S/P 1 unit of packed RBCs for Hg 6.8. His Eliquis  was held. Last colonoscopy in 2019 which showed diverticulosis and multiple polyps.  Patient endorsed improving shortness of breath following transfusion no chest pain. Underwent EGD 10/27 has congested friable gastric mucosa likely the source of bleeding  10/30 colonoscopy showed hemorrhoids likely cause of patient's blood in the stool Patient having ongoing AKI nephro cardio following 10/31 underwent RHC, and had central line placement-patient was started on dobutamine drip 1. Elevated right-sided filling pressures out of proportion to mildly elevated PCWP.  Mild pulmonary venous hypertension Low cardiac output by thermo though preserved by Fick. Low PAPi>S uspect primarily RV dysfunction> started dobutamine 2.5 for RV support.  Subjective: Seen and examined Overnight BP had been soft 80s-100s systolic this am in 87 systolic No new complaints, seems bit upbeat today Labs with creatinine at 5.0> 5.17>4.8,BUN 67>63>57, potassium 3.0 Weight on admission 232, > 237 improvng> 234>228 Urine output peaked  950/cc>2320> now  3350cc  Assessment and plan:  ABLA due to upper GI bleed with friable congested gastric mucosa Hematochezia due to internal hemorrhoids:  history of LA grade a reflux esophagitis and gastritis seen on EGD in 2019. S/p EGD 10/27: congested friable gastric mucosa  likely the source of bleeding.  Colonoscopy 10/30 showed 3-5 mm polyps in transverse colon removed, nonbleeding internal hemorrhoids noticed likely cause of hematochezia but doubt it accounts for patient's drop in hemoglobin.So far received 3 units PRBC- hb holding ? 8 gm  Continue to monitor hemoglobin. Eliquis  resumed 11/1 after GI clearance, cont ppi  Recent Labs    04/23/24 1216 04/23/24 1906 04/24/24 0500 04/25/24 0547 04/26/24 0500  HGB 8.0* 8.5* 8.3* 8.2* 7.8*  MCV  --   --  83.2 83.3 82.7  FERRITIN  --   --  14*  --   --   TIBC  --   --  357  --   --   IRON  --   --  13*  --   --    Acute on chronic CHF with midrange EF 43% RV dysfunction HCM Hypertension history Hypotension-persistent Nonsustained V. tach: Prior to admission on GDMT-discontinued due to hypotension, started midodrine up at 15mg  tid AHF following-repeat TTE- EF 40-45% global SMK severe left ventricular hypertrophy G3 DD.  S/p RHC 10/31, and central line placement and placed on dobutamine drip as suspecting primary RV dysfunction On high-dose Lasix  120mg , and dobutamine drip started following RHC Overall weight improving, having good urine output and creatinine today slightly improving Monitor daily I/O,weight, electrolytes and net balance as below.Keep on  salt/fluid restricted diet and monitor in tele. Net IO Since Admission: -496.78 mL [04/26/24 1048]  Filed Weights   04/24/24 0455 04/25/24 0500 04/26/24 0700  Weight: 107.6 kg 106.5 kg 103.5 kg    AKI on CKD stage V Metabolic acidosis Hypokalemia: Creat b/l 1.5-2.5.likely multifactorial AKI on CKD due to GI bleeding, severe anemia,  hypotension,RV dysfunction and need for diuretics Nephro following closely, high risk for worsening of renal failure/dialysis Hopefully renal function will improve with dobutamine drip Lasix , doing metalozone x 1-creatinine slightly better today urine output picking up as below Continue diuresis/Iv albumin as per  nephrology/cardio.  Replace potassium and magnesium  Recent Labs    03/24/24 1624 04/17/24 1704 04/19/24 0447 04/20/24 0435 04/21/24 0319 04/22/24 0404 04/23/24 0445 04/23/24 1058 04/23/24 1059 04/24/24 0500 04/25/24 0547 04/26/24 0500  BUN 31* 57* 73* 76* 77* 71* 70*  --   --  67* 63* 57*  CREATININE 2.47* 3.13* 3.97* 4.00* 4.25* 4.49* 4.82*  --   --  5.08* 5.17* 4.82*  CO2 25 21* 21* 19* 18* 20* 18*  --   --  20* 20* 25  K 4.3 4.4 4.3 3.7 3.8 4.0 3.6 3.7 3.8 3.4* 3.3* 3.0*   Intake/Output Summary (Last 24 hours) at 04/26/2024 1048 Last data filed at 04/26/2024 1034 Gross per 24 hour  Intake 1010.35 ml  Output 3700 ml  Net -2689.65 ml    Chronic depression: Mood stable, continue bupropion    Elevated troponin: Due to demand ischemia.  Plan per cardiology  Hyperlipidemia: continue home rosuvastatin    Persistent A-fib: Eliquis  resumed 11/1.  Rate is stable monitor in telemetry  DVT prophylaxis: Place TED hose Start: 04/21/24 1237 SCDs Start: 04/17/24 2333 Code Status:   Code Status: Full Code Family Communication: plan of care discussed with patient and his sister was updated previously at bedside  Patient status is: Remains hospitalized because of severity of illness Level of care: Progressive transfer to telemetry  Dispo: The patient is from: HOME alone, not married.            Anticipated disposition: TBD Objective: Vitals last 24 hrs: Vitals:   04/25/24 2344 04/26/24 0537 04/26/24 0700 04/26/24 0712  BP: 97/67 107/69  (!) 87/55  Pulse:    62  Resp: 20 18  18   Temp: 98.1 F (36.7 C) 98.2 F (36.8 C)  98.3 F (36.8 C)  TempSrc: Oral Oral  Oral  SpO2: 98% 98%  98%  Weight:   103.5 kg   Height:       Physical Examination: General exam: AAOX3 HEENT:Oral mucosa moist, Ear/Nose WNL grossly Respiratory system: B/L Clear,no use of accessory muscle Cardiovascular system: S1 & S2 +, No JVD. Gastrointestinal system: Abdomen soft,NT,ND, BS+ Nervous System:  Alert, awake, moving all extremities Extremities: extremities warm, leg edema trace Skin: Warm, no rashes MSK: Normal muscle bulk,tone, power   Medications reviewed:  Scheduled Meds:  apixaban   5 mg Oral BID   buPROPion   300 mg Oral Daily   Chlorhexidine  Gluconate Cloth  6 each Topical Daily   insulin  aspart  0-9 Units Subcutaneous TID WC   midodrine  15 mg Oral TID WC   pantoprazole   40 mg Oral BID   potassium chloride   40 mEq Oral BID   rosuvastatin   20 mg Oral Daily   simethicone   240 mg Oral Once   sodium chloride  flush  10-40 mL Intracatheter Q12H  Continuous Infusions:  DOBUTamine 2.5 mcg/kg/min (04/25/24 1421)   furosemide  120 mg (04/26/24 0905)   iron sucrose 200 mg (04/26/24 1013)  Diet: Diet Order             Diet Heart Room service appropriate? Yes; Fluid consistency: Thin  Diet effective now                    Data Reviewed: I have  personally reviewed following labs and imaging studies ( see epic result tab) CBC: Recent Labs  Lab 04/22/24 0404 04/23/24 0445 04/23/24 1058 04/23/24 1216 04/23/24 1906 04/24/24 0500 04/25/24 0547 04/26/24 0500  WBC 7.5 7.4  --   --   --  7.9 7.6 6.0  HGB 8.3* 7.1*   < > 8.0* 8.5* 8.3* 8.2* 7.8*  HCT 26.0* 22.9*   < > 25.4* 26.6* 25.8* 26.0* 24.4*  MCV 84.1 83.9  --   --   --  83.2 83.3 82.7  PLT 453* 362  --   --   --  332 331 312   < > = values in this interval not displayed.   CMP: Recent Labs  Lab 04/22/24 0404 04/23/24 0445 04/23/24 1058 04/23/24 1059 04/24/24 0500 04/25/24 0547 04/25/24 0900 04/26/24 0500  NA 139 138 139 139 140 135  --  139  K 4.0 3.6 3.7 3.8 3.4* 3.3*  --  3.0*  CL 102 102  --   --  103 100  --  100  CO2 20* 18*  --   --  20* 20*  --  25  GLUCOSE 157* 133*  --   --  139* 147*  --  119*  BUN 71* 70*  --   --  67* 63*  --  57*  CREATININE 4.49* 4.82*  --   --  5.08* 5.17*  --  4.82*  CALCIUM  8.8* 8.9  --   --  9.1 8.8*  --  9.1  MG  --  2.1  --   --  2.0  --  1.8 2.0   GFR:  Estimated Creatinine Clearance: 21.6 mL/min (A) (by C-G formula based on SCr of 4.82 mg/dL (H)). Recent Labs  Lab 04/23/24 0445  ALBUMIN 2.9*   No results for input(s): LIPASE, AMYLASE in the last 168 hours. No results for input(s): AMMONIA in the last 168 hours. Coagulation Profile:  No results for input(s): INR, PROTIME in the last 168 hours.  Unresulted Labs (From admission, onward)     Start     Ordered   04/27/24 0500  Basic metabolic panel with GFR  Daily,   R     Question:  Specimen collection method  Answer:  Unit=Unit collect   04/26/24 0830   04/27/24 0500  CBC  Daily,   R     Question:  Specimen collection method  Answer:  Unit=Unit collect   04/26/24 0830   04/23/24 1117  Cooxemetry Panel (carboxy, met, total hgb, O2 sat)  Daily,   R     Question:  Specimen collection method  Answer:  Lab=Lab collect   04/23/24 1116           Antimicrobials/Microbiology: Anti-infectives (From admission, onward)    None         Component Value Date/Time   SDES BLOOD LEFT ANTECUBITAL 07/07/2016 1225   SPECREQUEST BOTTLES DRAWN AEROBIC AND ANAEROBIC 5CC 07/07/2016 1225   CULT  07/07/2016 1225    NO GROWTH 5 DAYS Performed at [facility], [address], [place], [place] [address]    REPTSTATUS 07/12/2016 FINAL 07/07/2016 1225    Procedures: Procedure(s) (LRB): RIGHT HEART CATH (N/A)   Mennie LAMY, MD [facility] 04/26/2024, 1:13 PM

## 2024-04-26 NOTE — Plan of Care (Signed)
  Problem: Education: Goal: Knowledge of General Education information will improve Description: Including pain rating scale, medication(s)/side effects and non-pharmacologic comfort measures Outcome: Progressing   Problem: Clinical Measurements: Goal: Ability to maintain clinical measurements within normal limits will improve Outcome: Progressing   Problem: Clinical Measurements: Goal: Will remain free from infection Outcome: Progressing   Problem: Clinical Measurements: Goal: Diagnostic test results will improve Outcome: Progressing   Problem: Nutrition: Goal: Adequate nutrition will be maintained Outcome: Progressing   Problem: Coping: Goal: Level of anxiety will decrease Outcome: Progressing   Problem: Safety: Goal: Ability to remain free from injury will improve Outcome: Progressing   Problem: Pain Managment: Goal: General experience of comfort will improve and/or be controlled Outcome: Progressing

## 2024-04-26 NOTE — Progress Notes (Addendum)
 Patient ID: Keith Leblanc, male   DOB: 1966/03/06, 58 y.o.   MRN: [id number]     Advanced Heart Failure Rounding Note  Cardiologist: None  Chief Complaint: A/c HFmrEF, GIB  Patient Profile   58 y/o male w/ prior h/o heavy ETOH use (recently quit), HFmrEF, CKDIIIa, PAF on Eliquis , HTN, HLD and Type 2DM, admitted w/ symptomatic ABLA 2/2 GIB. HF management c/b persistent hypotension and AKI despite adequate volume resuscitation. AHF team asked to assist.   Subjective:    Echo this admit EF 40-45% (previously 45-50%), severe LVH, RV mod reduced, RVSP 42, dilated IVC estimated RAP ~15 mmHg   Co-ox 73%. CVP 15. On DBA 2.5. BP stable on midodrine 15 tid. Net -2.3L. Weight down 6lb.  BUN 63>57, Cr 5.17>4.82 Hgb 8.2>7.8, tsat 4%  Sitting up in bed. No SOB or chest pain. Slept well overnight.   Objective:    Weight Range: 103.5 kg Body mass index is 27.05 kg/m.   Vital Signs:   Temp:  [98.1 F (36.7 C)-98.2 F (36.8 C)] 98.2 F (36.8 C) (11/03 0537) Pulse Rate:  [64-66] 66 (11/02 1156) Resp:  [18-20] 18 (11/03 0537) BP: (95-107)/(63-70) 107/69 (11/03 0537) SpO2:  [98 %-99 %] 98 % (11/03 0537) Weight:  [103.5 kg] 103.5 kg (11/03 0700) Last BM Date : 04/24/24  Weight change: Filed Weights   04/24/24 0455 04/25/24 0500 04/26/24 0700  Weight: 107.6 kg 106.5 kg 103.5 kg   Intake/Output:  Intake/Output Summary (Last 24 hours) at 04/26/2024 0709 Last data filed at 04/26/2024 0500 Gross per 24 hour  Intake 720 ml  Output 3350 ml  Net -2630 ml    Physical Exam   General: Well appearing. No distress on RA Cardiac: JVP ~12cm. S1 and S2 present. No murmurs Abdomen: Soft, non-tender, non-distended.  Extremities: Warm and dry. No peripheral edema.  Neuro: Alert and oriented x3. Affect pleasant. Moves all extremities without difficulty.  EKG   AF 60s (personally reviewed)  Labs    CBC Recent Labs    04/25/24 0547 04/26/24 0500  WBC 7.6 6.0  HGB 8.2* 7.8*  HCT 26.0*  24.4*  MCV 83.3 82.7  PLT 331 312   Basic Metabolic Panel Recent Labs    88/98/74 0500 04/25/24 0547 04/25/24 0900 04/26/24 0500  NA 140 135  --  139  K 3.4* 3.3*  --  3.0*  CL 103 100  --  100  CO2 20* 20*  --  25  GLUCOSE 139* 147*  --  119*  BUN 67* 63*  --  57*  CREATININE 5.08* 5.17*  --  4.82*  CALCIUM  9.1 8.8*  --  9.1  MG 2.0  --  1.8  --    BNP (last 3 results) Recent Labs    03/24/24 1624 04/17/24 1704  BNP 1,093.8* 1,324.0*   ProBNP (last 3 results) Recent Labs    03/12/24 0942  PROBNP 5,364.0*   Medications:    Scheduled Medications:  sodium chloride    Intravenous Once   apixaban   5 mg Oral BID   buPROPion   300 mg Oral Daily   Chlorhexidine  Gluconate Cloth  6 each Topical Daily   insulin  aspart  0-9 Units Subcutaneous TID WC   midodrine  15 mg Oral TID WC   pantoprazole   40 mg Oral BID   rosuvastatin   20 mg Oral Daily   simethicone   240 mg Oral Once   sodium chloride  flush  10-40 mL Intracatheter Q12H    Infusions:  DOBUTamine  2.5 mcg/kg/min (04/25/24 1421)   furosemide  120 mg (04/25/24 1719)    PRN Medications: acetaminophen  **OR** acetaminophen , ondansetron  **OR** ondansetron  (ZOFRAN ) IV, sodium chloride  flush, traMADol  Assessment/Plan   1. Acute on chronic HF with mid range EF: Echo in 8/25 showed EF 45-50%, severe LVH, abnormal RV free wall strain, ~17%, RV mildly reduced.  Cardiac MRI showed moderate concentric LVH with EF 43%, RV EF 47%, ECV 36%, diffuse patchy LGE throughout LV myocardium. PYP scan negative.  He had an endomyocardial biopsy that was negative for cardiac amyloidosis. Suspect hypertrophic cardiomyopathy. BUN/creatinine remain elevated but slightly down from yesterday with predominant RV failure. CVP 15.  Co-ox 73% on dobutamine 2.5.  - Repeat lasix  120mg  IV BID + metolazone 2.5 x 1 dose - Continue dobutamine 2.5 - Continue current midodrine 15 mg tid for now to maintain MAP.   2. AKI on CKD stage 3b: Renal US  negative.  ?ATN in setting of hypotension from GI bleeding.  Creatinine/BUN are stable today, slightly down, at 4.82/57 - nephrology now following, conducting w/u to exclude nephrotic syndrome  - Renal US  normal - Multiple Myeloma panel 9/25 negative for M-spike protein   3. Atrial fibrillation: Paroxysmal. In rate controlled afib this admit  - Eliquis  restarted with resolution of overt GI bleeding.  - can plan outpatient DCCV after back on Eliquis  x 3 wks   4. GI bleeding: Has had transfusions.  EGD with gastritis.  Colonoscopy w/ 2 polyps (resected) and non bleeding hemorrhoids. - no further gross bleeding  - Transferrin saturation 4%, given IV Fe.  - Hgb 8.2>7.8, transfusion per primary team - Back on apixaban .   5. ETOH abuse.  - he reports he quit ~6 wks ago   Length of Stay: 65  Jordan Lee, NP  04/26/2024, 7:09 AM  Patient seen and examined with the above-signed Advanced Practice Provider and/or Housestaff. I personally reviewed laboratory data, imaging studies and relevant notes. I independently examined the patient and formulated the important aspects of the plan. I have edited the note to reflect any of my changes or salient points. I have personally discussed the plan with the patient and/or family.  Remains on DBA. Still volume overloaded, Co-ox ok. CVP high.  SCr slightly improved  General:  Sitting up in bed. No resp difficulty HEENT: normal Neck: supple. JVP to jaw  Cor: Regular rate & rhythm. No rubs, gallops or murmurs. Lungs: clear Abdomen: soft, nontender, nondistended.Good bowel sounds. Extremities: no cyanosis, clubbing, rash, 2+ edema Neuro: alert & orientedx3, cranial nerves grossly intact. moves all 4 extremities w/o difficulty. Affect pleasant  Continue diuresis with DBA support. Follow SCr closely. Place compression hose.   Toribio Fuel, MD  12:24 PM

## 2024-04-26 NOTE — Telephone Encounter (Signed)
 Auth Submission: NO AUTH NEEDED Site of care: Site of care: [facility] Payer: [employer] Medication & CPT/J Code(s) submitted: Venofer (Iron Sucrose) J1756 Diagnosis Code: D50.9 Route of submission (phone, fax, portal):  Phone # Fax # Auth type: Buy/Bill PB Units/visits requested:  Reference number:  Approval from: 04/26/24 to 06/23/24

## 2024-04-26 NOTE — Telephone Encounter (Signed)
 Patient referred to infusion pharmacy team for ambulatory infusion of IV iron.  Insurance - [employer]ist of care - Site of care: [facility] Dx code - I50.43/N17.9/I48.0 IV Iron Therapy - Venofer 200 mg IV x 4  Infusion appointments - Scheduling team will schedule patient as soon as possible.   Jaysion Ramseyer D. Maciah Schweigert, PharmD

## 2024-04-26 NOTE — Progress Notes (Deleted)
 Patient ID: Keith Leblanc, male   DOB: 08/02/1965, 58 y.o.   MRN: [id number]   HPI: Keith Leblanc is a 58 y.o.-year-old male, returning for f/u for  DM2, dx in 06/2016 (prev. Prediabetes since 2015-16), insulin -dependent, uncontrolled, with complications (CKD). Last visit 1 year and 8 months ago.  Interim history: He was not able to stop drinking since last visit.  He did see counselors without good results.  He did not try AA.  Also, he was given a medication by PCP to help with this, but he did not try it.  However, his daughter is marrying next month and starting today he is trying to stay off alcohol so that he can walk her down the aisle. No chest pain, increased urination, nausea Since last visit, he had a stroke in 10/2022.  He was found to have PAF.  Now on Eliquis .  Reviewed history: Patient was diagnosed with diabetes in 07/07/2016, when he presented to the hospital in DKA. An A1c was 12%. His CBG was 1081. Retrospectively, he had increased urination, increased thirst, blurred vision, fatigue, rapid weight loss of 25 lbs. He had a lot of stress at work + drinking A LOT of sweet drinks (4 cans of soda a day + gatorade + juice). He then came off off these but still working on decreasing Alcohol - 4-5 beers a day.  He was started on insulin  inhouse, and his CBG is greatly improved afterwards. Less than a month later, his HbA1c decreased to 9.6%.  Reviewed HbA1c levels: Lab Results  Component Value Date   HGBA1C 5.4 03/12/2024   HGBA1C 4.7 10/23/2023   HGBA1C 5.6 11/09/2022  03/14/2022: HbA1c calculated from fructosamine is 5.3%.  Pt was on a regimen of: - Metformin  500 mg 0-1x a day with dinner >> could not tolerate this 2/2 nausea - Basaglar  40 units at bedtime - takes 2-3x a week - Novolog  6 units 3x a day, before meals >> stopped 12/2016  Now on: - Trulicity  0.75 >> 1.5 >> 0.75 mg weekly - out for 3 weeks - Lantus  30 >> 24 >> 20 >> 10-12 >> but actually taking 30 units daily  in am   Pt checks his sugars once a day: - am: 89, 130s, 140 >> 85-110 >> 69, 90-105 >> 78, 95-138, 160 - 2h after b'fast: 107-122 >> n/c >> 176 >> n/c - before lunch: 130-140 >> 78, 120-134 >> n/c - 2h after lunch: n/c >> 165 >> n/c >> 87-139 - before dinner: n/c >> 95-110 >> n/c >> 173 >> n/c - 2h after dinner: 120s >> n/c >> 30 min-1h: 115-130 >> 100-120, 189 - bedtime: n/c >> 89-100 >> n/c >> 165 >> n/c - nighttime: n/c Lowest sugar was 89 >> 78 >> 69; he has hypoglycemia awareness at 80s Highest sugar was 400s ... >> 176 >> 165 >> 2 mo ago: 189. He was in the ED 05/2018 for hyperglycemia (CBG 400s >> ED: 361).   Glucometer: One Touch Ultra  Pt's meals are: - Breakfast: 2-3 boiled eggs, sausage, cheese, diet tea - Lunch: salad, chicken or pork, coke zero - Dinner: sea food + veggies + brown rice, coke zero - Snacks: water  -+ CKD: Lab Results  Component Value Date   BUN 57 (H) 04/26/2024   BUN 63 (H) 04/25/2024   CREATININE 4.82 (H) 04/26/2024   CREATININE 5.17 (H) 04/25/2024   No results found for: MICRALBCREAT 11/22/2021: 22/2.28, GFR 33, ACR 11, glucose 95  -+ HL:  Lab Results  Component Value Date   CHOL 164 10/23/2023   HDL 66 10/23/2023   LDLCALC 76 10/23/2023   LDLDIRECT 72.0 09/12/2021   TRIG 134 10/23/2023   CHOLHDL 2.5 10/23/2023  Prev. On Fenofibrate  145 mg daily >> now off, he thinks. He remembers starting a statin >> not anymore.  - last eye exam was in 06/2020: No DR reportedly.   -No numbness and tingling in his feet.  Last foot exam 10/23/2023.  Previously on White Ginseng, Ashwagandha, burdock root. He has a low B12 of 165 in 11/2022.  He is on a multivitamin.  ROS: + see HPI  I reviewed pt's medications, allergies, PMH, social hx, family hx, and changes were documented in the history of present illness. Otherwise, unchanged from my initial visit note.  Past Medical History:  Diagnosis Date   Blood in stool    bright red blood    Chicken  pox    Diabetes mellitus (HCC)    Erectile dysfunction    Gout    Hypertension    Stroke [facility])    Past Surgical History:  Procedure Laterality Date   APPENDECTOMY  2004   COLONOSCOPY     COLONOSCOPY N/A 04/22/2024   Procedure: COLONOSCOPY;  Surgeon: Stacia Glendia BRAVO, MD;  Location: [facility] ENDOSCOPY;  Service: Gastroenterology;  Laterality: N/A;   ENDOMYOCARDIAL BIOPSY N/A 03/16/2024   Procedure: ENDOMYOCARDIAL BIOPSY;  Surgeon: Zenaida Morene PARAS, MD;  Location[facility] INVASIVE CV LAB;  Service: Cardiovascular;  Laterality: N/A;   ESOPHAGOGASTRODUODENOSCOPY N/A 04/19/2024   Procedure: EGD (ESOPHAGOGASTRODUODENOSCOPY);  Surgeon: Stacia Glendia BRAVO, MD;  Location: [facility] ENDOSCOPY;  Service: Gastroenterology;  Laterality: N/A;   RIGHT HEART CATH N/A 03/16/2024   Procedure: RIGHT HEART CATH;  Surgeon: Zenaida Morene PARAS, MD;  Location: [facility] INVASIVE CV LAB;  Service: Cardiovascular;  Laterality: N/A;   RIGHT HEART CATH N/A 04/23/2024   Procedure: RIGHT HEART CATH;  Surgeon: Rolan Ezra RAMAN, MD;  Location: [facility] INVASIVE CV LAB;  Service: Cardiovascular;  Laterality: N/A;   TOOTH EXTRACTION  07/06/2020   Social History   Social History   Marital status: Single    Spouse name: N/A   Number of children: 1 - 48 y/o in 2018   Occupational History   Aeronautical engineer    Social History Main Topics   Smoking status: Former Smoker   Smokeless tobacco: Started to chew tobacco    Alcohol use      Comment: 4-5 beers a day    Drug use: Yes    Types: Marijuana   Sexual activity: Yes   Social History Narrative   Works for [employer]    Not married    One daughter who does not live with him    Likes to gamble, walk in the parks, travel.    Current Facility-Administered Medications on File Prior to Visit  Medication Dose Route Frequency Provider Last Rate Last Admin   0.9 %  sodium chloride  infusion (Manually program via Guardrails IV Fluids)   Intravenous Once Marcine Catalan M, PA-C        acetaminophen  (TYLENOL ) tablet 650 mg  650 mg Oral Q6H PRN Dena Charleston, MD   650 mg at 04/24/24 1834   Or   acetaminophen  (TYLENOL ) suppository 650 mg  650 mg Rectal Q6H PRN Dena Charleston, MD       apixaban  (ELIQUIS ) tablet 5 mg  5 mg Oral BID Stoner, Benjamin J, MD   5 mg at 04/25/24 2100  buPROPion  (WELLBUTRIN  XL) 24 hr tablet 300 mg  300 mg Oral Daily Dorrell, Robert, MD   300 mg at 04/25/24 0848   Chlorhexidine  Gluconate Cloth 2 % PADS 6 each  6 each Topical Daily Shona Terry SAILOR, DO   6 each at 04/25/24 0850   DOBUTamine (DOBUTREX) infusion 4000 mcg/mL  2.5 mcg/kg/min Intravenous Continuous Rolan Ezra RAMAN, MD 3.95 mL/hr at 04/25/24 1421 2.5 mcg/kg/min at 04/25/24 1421   furosemide  (LASIX ) 120 mg in dextrose  5 % 50 mL IVPB  120 mg Intravenous BID McLean, Dalton S, MD 62 mL/hr at 04/25/24 1719 120 mg at 04/25/24 1719   insulin  aspart (novoLOG ) injection 0-9 Units  0-9 Units Subcutaneous TID WC Christobal Guadalajara, MD   1 Units at 04/25/24 9347   midodrine (PROAMATINE) tablet 15 mg  15 mg Oral TID WC Kc, Guadal[place]ara, MD   15 mg at 04/25/24 1714   ondansetron  (ZOFRAN ) tablet 4 mg  4 mg Oral Q6H PRN Dena Charleston, MD       Or   ondansetron  (ZOFRAN ) injection 4 mg  4 mg Intravenous Q6H PRN Dena Charleston, MD       pantoprazole  (PROTONIX ) EC tablet 40 mg  40 mg Oral BID Baglia, Corrina, PA-C   40 mg at 04/25/24 2100   potassium chloride  SA (KLOR-CON  M) CR tablet 40 mEq  40 mEq Oral Q3H Lee, Jordan, NP       rosuvastatin  (CRESTOR ) tablet 20 mg  20 mg Oral Daily Dorrell, Robert, MD   20 mg at 04/25/24 0848   simethicone  (MYLICON) chewable tablet 240 mg  240 mg Oral Once Guenther, Paula M, NP       sodium chloride  flush (NS) 0.9 % injection 10-40 mL  10-40 mL Intracatheter Q12H Hall, Carole N, DO   10 mL at 04/25/24 2152   sodium chloride  flush (NS) 0.9 % injection 10-40 mL  10-40 mL Intracatheter PRN Shona Terry SAILOR, DO       traMADol (ULTRAM) tablet 50 mg  50 mg Oral Q12H PRN Christobal Guadalajara, MD   50 mg at  04/25/24 0848   Current Outpatient Medications on File Prior to Visit  Medication Sig Dispense Refill   apixaban  (ELIQUIS ) 5 MG TABS tablet Take 1 tablet (5 mg total) by mouth 2 (two) times daily. 180 tablet 1   bisoprolol  (ZEBETA ) 10 MG tablet Take 2 tablets (20 mg total) by mouth daily. 180 tablet 3   blood glucose meter kit and supplies Dispense based on patient and insurance preference. Use up to four times daily as directed. (FOR ICD-9 250.00, 250.01). 1 each 0   buPROPion  (WELLBUTRIN  XL) 300 MG 24 hr tablet TAKE 1 TABLET BY MOUTH ONCE  DAILY 30 tablet 11   colchicine  0.6 MG tablet TAKE 1 TABLET BY MOUTH DAILY  WHEN HAVING FLARES TAKE 1 TABLET BY MOUTH TWICE DAILY (Patient taking differently: Take 0.6 mg by mouth as needed. TAKE 1 TABLET BY MOUTH  DAILY WHEN HAVING FLARES  TAKE 1 TABLET BY MOUTH  TWICE DAILY) 180 tablet 3   empagliflozin  (JARDIANCE ) 10 MG TABS tablet Take 1 tablet (10 mg total) by mouth daily. 30 tablet 11   furosemide  (LASIX ) 20 MG tablet Take 2 tablets (40 mg total) by mouth as needed (For increased swelling, weight gain of 3 LB in 1 day or 5 LB in 1 week). 90 tablet 3   glucose blood (ONETOUCH ULTRA TEST) test strip Use to check blood sugar 2-3 times a day. 300  each 12   hydrOXYzine (VISTARIL) 50 MG capsule Take 1 capsule (50 mg total) by mouth every 8 (eight) hours as needed. (Patient taking differently: Take 50 mg by mouth every 8 (eight) hours as needed for itching.) 15 capsule 0   Insulin  Pen Needle (B-D UF III MINI PEN NEEDLES) 31G X 5 MM MISC USE 4 TIMES DAILY AFTER MEALS AND AT BEDTIME 100 each 11   Lancets (ONETOUCH ULTRASOFT) lancets Test blood sugars as directed. Dx E11.9 100 each 12   magnesium  oxide (MAG-OX) 400 (240 Mg) MG tablet TAKE 1 TABLET(400 MG) BY MOUTH DAILY 30 tablet 6   Multiple Vitamins-Minerals (ONE-A-DAY MENS 50+) TABS Take 1 tablet by mouth daily with breakfast.     nicotine  (NICODERM CQ  - DOSED IN MG/24 HR) 7 mg/24hr patch Place 1 patch (7 mg  total) onto the skin daily. (Patient not taking: Reported on 04/17/2024) 28 patch 0   omeprazole  (PRILOSEC) 40 MG capsule TAKE 1 CAPSULE BY MOUTH DAILY (Patient taking differently: Take 40 mg by mouth daily before breakfast.) 90 capsule 3   potassium chloride  (KLOR-CON ) 10 MEQ tablet TAKE 1 TABLET(10 MEQ) BY MOUTH DAILY (Patient not taking: Reported on 04/17/2024) 90 tablet 0   potassium chloride  SA (KLOR-CON  M) 20 MEQ tablet Take 2 tablets (40 mEq total) by mouth daily as needed (take with lasix  as needed dose). 30 tablet 2   rosuvastatin  (CRESTOR ) 20 MG tablet Take 1 tablet (20 mg total) by mouth daily. 90 tablet 3   thiamine  (VITAMIN B-1) 100 MG tablet Take 1 tablet (100 mg total) by mouth daily. 30 tablet 0   triamcinolone cream (KENALOG) 0.5 % Apply 1 Application topically 2 (two) times daily. 90 g 0   No Known Allergies Family History  Problem Relation Age of Onset   Alcohol abuse Father    Hypertension Father    Heart disease Father    Gout Father    Breast cancer Mother    Hypertension Mother    Gout Mother    Healthy Sister    Healthy Daughter    Colon cancer Neg Hx    Esophageal cancer Neg Hx    Rectal cancer Neg Hx    Stomach cancer Neg Hx   Pt has FH of DM in mother.  PE: There were no vitals taken for this visit. Wt Readings from Last 10 Encounters:  04/26/24 228 lb 1.6 oz (103.5 kg)  03/24/24 239 lb 3.2 oz (108.5 kg)  03/23/24 236 lb (107 kg)  03/18/24 229 lb 14.4 oz (104.3 kg)  12/11/23 266 lb (120.7 kg)  10/23/23 260 lb 9.6 oz (118.2 kg)  08/29/23 241 lb (109.3 kg)  04/01/23 250 lb (113.4 kg)  01/01/23 250 lb (113.4 kg)  12/16/22 254 lb 9.6 oz (115.5 kg)   Constitutional: overweight, in NAD Eyes:  EOMI, no exophthalmos ENT: no neck masses, no cervical lymphadenopathy Cardiovascular: RRR, No MRG, + B significant pitting edema Respiratory: CTA B Musculoskeletal: no deformities Skin:no rashes Neurological: + tremor with outstretched hands  ASSESSMENT: 1.  DM2, -insulin -dependent, now more controlled, with complications - CKD  He has type 2, rather than type 1 diabetes: Component     Latest Ref Rng & Units 08/27/2016  Glucose     70 - 99 mg/dL 881 (H)  Pancreatic Islet Cell Antibody     <5 JDF Units <5  Glutamic Acid Decarb Ab     <5 IU/mL <5  C-Peptide     0.80 - 3.85 ng/mL  1.45   2. Obesity class 1 3.  Hypertriglyceridemia  PLAN:  1. Patient with history of fairly well-controlled type 2 diabetes, at last visit returning after long absence of 1 year and 8 months.  Prior to the visit, he had a stroke in 10/2022.  He does have a history of paroxysmal atrial fibrillation and he is on Eliquis .  At last visit he was off Trulicity  after running out of refills.  I refilled this for him today and decrease the dose of insulin .  HbA1c calculated from fructosamine was 5.4%. -We again discussed about the importance of quitting alcohol.  I recommended to try AA.  -I advised him to: Patient Instructions  Please continue: - Jardiance  10 mg in a.m. - Trulicity  0.75 mg weekly - Lantus  24 units in a.m.   Please return in 6 months with your sugar log.   - we checked his HbA1c: 7%  - advised to check sugars at different times of the day - 1x a day, rotating check times - advised for yearly eye exams >> he is UTD - return to clinic in 6 months  2. Obesity class 1 - He continues on GLP-1 receptor agonist, restarted at last visit, and also SGLT2 inhibitor now, which should also help with weight loss - He gained 30 pounds before last visit, possibly also due to fluid retention, previously lost 58 pounds before that prior 3 visit combined  3. HTG - We discussed at the previous visit about reducing/stopping alcohol - Latest lipid panel showed fractions at goal except for an LDL slightly above target of less than 70 Lab Results  Component Value Date   CHOL 164 10/23/2023   HDL 66 10/23/2023   LDLCALC 76 10/23/2023   LDLDIRECT 72.0 09/12/2021    TRIG 134 10/23/2023   CHOLHDL 2.5 10/23/2023  - He was on fenofibrate  145 mg daily - off now.  He is on Crestor  20 mg daily.  Lela Fendt, MD PhD [facility] Endocr[facility]ology

## 2024-04-26 NOTE — Progress Notes (Addendum)
 Keith Leblanc is an 58 y.o. male hypertension, dyslipidemia, T2DM, afib on Eliquis , CHF with grade third diastolic dysfunction/concentric LVH who was presented with symptomatic anemia, acute blood loss anemia seen as a consultation for the evaluation of AKI on CKD. Worsening SOB, DOE, BRBPR. Hypotensive in ED. EGD showed congested friable gastric mucosa likely the source of bleeding and colonoscopy on 10/30 showed hemorrhoids.  He was also managed for acute on chronic CHF exacerbation, EF was around 45% however grade third diastolic dysfunction.  Seen by cardiology team and placed on IV diuretics.    For CKD, patient was seen once at [facility] 2023; recent serum creatinine level fluctuating anywhere around 1.4-2.5. He has hypokalemia, noted albumin level of 2.4. No recent NSAID use and no IV contrast use. Jardiance  which is currently on hold.  He takes furosemide  40 mg at home.  Assessment/Plan: # Acute kidney injury on CKDIIIb-IV ( b/lcr 1.5-2.5): Acute kidney injury likely due to reduced renal perfusion (low effective circulatory volume) caused by persistent hypotension, severe anemia, ongoing use of diuretics for CHF.  Not on ACE inhibitor, ARB and no recent use of IV contrast. Appears to be CRS; hopefully did not develop ATN from hyperperfusion and hypotension.  -Currently on furosemide  120 mg twice daily with dobutamine started 10/31 evening.   He does have peripheral edema with low serum albumin level. Renal function starting to improve with the inotropic support and diuresis with 3.4L UOP /24hrs, Co-ox 72.9. Cont therapy and will continue to follow. Klorcon 40meq BID x4 doses and will follow K closely.  - Continue with strict ins and out, daily lab.  -Kidney ultrasound neg for r/o obstruction (he does c/o feeling full after voiding); will check a PVR bladder scan as well - Continue to avoid ACE inhibitor, ARB or SGLT 2 inhibitor.   # Acute blood loss anemia due to GI bleed, symptomatic.  Seen by  gastroenterologist and underwent upper and lower GI endoscopy.  Received multiple units of blood transfusion with improvement of hemoglobin.  Back on anticoagulation now. S/p resection of polyps + hemorrhoids but were not actively bldg - Severely iron deficient -> will load w IV iron and then after a few doses ESA   # Persistent hypotension:   Not on any antihypertensives.  Agree with midodrine and rx with albumin.   # Acute on chronic CHF with EF of 45 to 50%, diastolic dysfunction: Right heart cath 10/31 showed high right sided pressures now on dobutamine resulting in much improved UOP   # Metabolic acidosis: CO2 20, trend lab.  Subjective: Denies f/c/n/v/sob; he feels the lower ext edema is much better than previously. He has a better outlook today especially as his renal function is improving.  was very down yest with no improvement in renal function but better spirits today.  Co-Ox 72.9   Chemistry and CBC: Creat  Date/Time Value Ref Range Status  10/23/2023 10:05 AM 1.56 (H) 0.70 - 1.30 mg/dL Final  91/95/7978 90:93 AM 1.37 (H) 0.70 - 1.33 mg/dL Final    Comment:    For patients >10 years of age, the reference limit for Creatinine is approximately 13% higher for people identified as African-American. .    Creatinine, Ser  Date/Time Value Ref Range Status  04/26/2024 05:00 AM 4.82 (H) 0.61 - 1.24 mg/dL Final  88/97/7974 94:52 AM 5.17 (H) 0.61 - 1.24 mg/dL Final  88/98/7974 94:99 AM 5.08 (H) 0.61 - 1.24 mg/dL Final  89/68/7974 95:54 AM 4.82 (H) 0.61 - 1.24 mg/dL  Final  04/22/2024 04:04 AM 4.49 (H) 0.61 - 1.24 mg/dL Final  89/70/7974 96:80 AM 4.25 (H) 0.61 - 1.24 mg/dL Final  89/71/7974 95:64 AM 4.00 (H) 0.61 - 1.24 mg/dL Final  89/72/7974 95:52 AM 3.97 (H) 0.61 - 1.24 mg/dL Final  89/74/7974 94:95 PM 3.13 (H) 0.61 - 1.24 mg/dL Final  89/98/7974 95:75 PM 2.47 (H) 0.61 - 1.24 mg/dL Final  90/69/7974 88:61 AM 2.42 (H) 0.40 - 1.50 mg/dL Final  90/74/7974 96:42 AM 2.88 (H) 0.61 -  1.24 mg/dL Final  90/75/7974 94:43 AM 3.05 (H) 0.61 - 1.24 mg/dL Final  90/76/7974 95:63 AM 2.79 (H) 0.61 - 1.24 mg/dL Final  90/77/7974 94:53 AM 3.08 (H) 0.61 - 1.24 mg/dL Final  90/78/7974 96:61 AM 3.16 (H) 0.61 - 1.24 mg/dL Final  90/79/7974 95:81 AM 2.63 (H) 0.61 - 1.24 mg/dL Final  90/80/7974 90:57 AM 2.53 (H) 0.61 - 1.24 mg/dL Final  93/73/7974 90:73 AM 1.46 (H) 0.76 - 1.27 mg/dL Final  89/91/7975 91:48 AM 1.60 (H) 0.76 - 1.27 mg/dL Final  93/74/7975 91:57 AM 1.47 0.40 - 1.50 mg/dL Final  94/77/7975 88:99 AM 1.74 (H) 0.40 - 1.50 mg/dL Final  94/80/7975 96:78 AM 1.29 (H) 0.61 - 1.24 mg/dL Final  94/81/7975 96:88 PM 1.37 (H) 0.61 - 1.24 mg/dL Final  94/81/7975 87:54 AM 1.54 (H) 0.61 - 1.24 mg/dL Final  94/82/7975 89:47 PM 1.80 (H) 0.61 - 1.24 mg/dL Final  94/82/7975 89:48 PM 1.66 (H) 0.61 - 1.24 mg/dL Final  96/72/7976 87:41 PM 1.60 (H) 0.40 - 1.50 mg/dL Final  96/77/7976 89:92 AM 1.75 (H) 0.40 - 1.50 mg/dL Final  96/97/7977 90:65 AM 1.41 0.40 - 1.50 mg/dL Final  98/79/7977 97:50 PM 1.35 (H) 0.76 - 1.27 mg/dL Final  90/98/7978 88:53 AM 1.27 0.76 - 1.27 mg/dL Final  87/96/7979 96:86 PM 1.25 0.40 - 1.50 mg/dL Final  87/73/7980 97:82 PM 1.32 (H) 0.61 - 1.24 mg/dL Final  89/88/7980 91:71 AM 1.34 0.40 - 1.50 mg/dL Final  88/84/7981 89:99 AM 1.17 0.40 - 1.50 mg/dL Final  98/76/7981 97:50 PM 1.11 0.40 - 1.50 mg/dL Final  98/83/7981 93:64 AM 1.18 0.61 - 1.24 mg/dL Final  98/84/7981 91:86 AM 1.74 (H) 0.61 - 1.24 mg/dL Final  98/84/7981 87:45 AM 1.78 (H) 0.61 - 1.24 mg/dL Final  98/85/7981 90:80 PM 1.85 (H) 0.61 - 1.24 mg/dL Final  98/85/7981 93:94 PM 2.31 (H) 0.61 - 1.24 mg/dL Final  98/85/7981 89:40 AM 2.76 (H) 0.61 - 1.24 mg/dL Final  88/71/7982 88:88 AM 1.18 0.40 - 1.50 mg/dL Final  87/83/7983 90:63 AM 1.15 0.40 - 1.50 mg/dL Final   Recent Labs  Lab 04/20/24 0435 04/21/24 0319 04/22/24 0404 04/23/24 0445 04/23/24 1058 04/23/24 1059 04/24/24 0500 04/25/24 0547 04/26/24 0500   NA 138 135 139 138 139 139 140 135 139  K 3.7 3.8 4.0 3.6 3.7 3.8 3.4* 3.3* 3.0*  CL 107 102 102 102  --   --  103 100 100  CO2 19* 18* 20* 18*  --   --  20* 20* 25  GLUCOSE 155* 142* 157* 133*  --   --  139* 147* 119*  BUN 76* 77* 71* 70*  --   --  67* 63* 57*  CREATININE 4.00* 4.25* 4.49* 4.82*  --   --  5.08* 5.17* 4.82*  CALCIUM  8.6* 8.9 8.8* 8.9  --   --  9.1 8.8* 9.1   Recent Labs  Lab 04/23/24 0445 04/23/24 1058 04/23/24 1906 04/24/24 0500 04/25/24 0547 04/26/24 0500  WBC  7.4  --   --  7.9 7.6 6.0  HGB 7.1*   < > 8.5* 8.3* 8.2* 7.8*  HCT 22.9*   < > 26.6* 25.8* 26.0* 24.4*  MCV 83.9  --   --  83.2 83.3 82.7  PLT 362  --   --  332 331 312   < > = values in this interval not displayed.   Liver Function Tests: Recent Labs  Lab 04/23/24 0445  ALBUMIN 2.9*   No results for input(s): LIPASE, AMYLASE in the last 168 hours. No results for input(s): AMMONIA in the last 168 hours. Cardiac Enzymes: No results for input(s): CKTOTAL, CKMB, CKMBINDEX, TROPONINI in the last 168 hours. Iron Studies:  Recent Labs    04/24/24 0500  IRON 13*  TIBC 357  FERRITIN 14*   PT/INR: @LABRCNTIP (inr:5)  Xrays/Other Studies: ) Results for orders placed or performed during the hospital encounter of 04/17/24 (from the past 48 hours)  Glucose, capillary     Status: Abnormal   Collection Time: 04/24/24 11:09 AM  Result Value Ref Range   Glucose-Capillary 159 (H) 70 - 99 mg/dL    Comment: Glucose reference range applies only to samples taken after fasting for at least 8 hours.   Comment 1 Notify RN    Comment 2 Document in Chart   Glucose, capillary     Status: None   Collection Time: 04/24/24  3:31 PM  Result Value Ref Range   Glucose-Capillary 95 70 - 99 mg/dL    Comment: Glucose reference range applies only to samples taken after fasting for at least 8 hours.  Glucose, capillary     Status: Abnormal   Collection Time: 04/24/24  8:30 PM  Result Value Ref Range    Glucose-Capillary 183 (H) 70 - 99 mg/dL    Comment: Glucose reference range applies only to samples taken after fasting for at least 8 hours.  Basic metabolic panel with GFR     Status: Abnormal   Collection Time: 04/25/24  5:47 AM  Result Value Ref Range   Sodium 135 135 - 145 mmol/L   Potassium 3.3 (L) 3.5 - 5.1 mmol/L   Chloride 100 98 - 111 mmol/L   CO2 20 (L) 22 - 32 mmol/L   Glucose, Bld 147 (H) 70 - 99 mg/dL    Comment: Glucose reference range applies only to samples taken after fasting for at least 8 hours.   BUN 63 (H) 6 - 20 mg/dL   Creatinine, Ser 4.82 (H) 0.61 - 1.24 mg/dL   Calcium  8.8 (L) 8.9 - 10.3 mg/dL   GFR, Estimated 12 (L) >60 mL/min    Comment: (NOTE) Calculated using the CKD-EPI Creatinine Equation (2021)    Anion gap 15 5 - 15    Comment: Performed at [facility], [address], [place], [place] [address]  CBC     Status: Abnormal   Collection Time: 04/25/24  5:47 AM  Result Value Ref Range   WBC 7.6 4.0 - 10.5 K/uL   RBC 3.12 (L) 4.22 - 5.81 MIL/uL   Hemoglobin 8.2 (L) 13.0 - 17.0 g/dL   HCT 73.9 (L) 60.9 - 47.9 %   MCV 83.3 80.0 - 100.0 fL   MCH 26.3 26.0 - 34.0 pg   MCHC 31.5 30.0 - 36.0 g/dL   RDW 83.9 (H) 88.4 - 84.4 %   Platelets 331 150 - 400 K/uL   nRBC 0.4 (H) 0.0 - 0.2 %    Comment: Performed  at [facility], [address] [place], [place] [address]  Cooxemetry Panel (carboxy, met, total hgb, O2 sat)     Status: Abnormal   Collection Time: 04/25/24  5:47 AM  Result Value Ref Range   Total hemoglobin 8.5 (L) 12.0 - 16.0 g/dL   O2 Saturation 66 %   Carboxyhemoglobin 3.0 (H) 0.5 - 1.5 %   Methemoglobin <0.7 0.0 - 1.5 %    Comment: Performed at [facility], [address], [place], [place] [address]  Glucose, capillary     Status: Abnormal   Collection Time: 04/25/24  6:03 AM  Result Value Ref Range   Glucose-Capillary 136 (H) 70 - 99 mg/dL    Comment: Glucose reference range applies only to samples taken after fasting  for at least 8 hours.  Magnesium      Status: None   Collection Time: 04/25/24  9:00 AM  Result Value Ref Range   Magnesium  1.8 1.7 - 2.4 mg/dL    Comment: Performed at [facility], [address], [place], [place] [address]  Glucose, capillary     Status: Abnormal   Collection Time: 04/25/24 11:54 AM  Result Value Ref Range   Glucose-Capillary 118 (H) 70 - 99 mg/dL    Comment: Glucose reference range applies only to samples taken after fasting for at least 8 hours.   Comment 1 Notify RN    Comment 2 Document in Chart   Glucose, capillary     Status: Abnormal   Collection Time: 04/25/24  4:24 PM  Result Value Ref Range   Glucose-Capillary 116 (H) 70 - 99 mg/dL    Comment: Glucose reference range applies only to samples taken after fasting for at least 8 hours.   Comment 1 Notify RN    Comment 2 Document in Chart   Glucose, capillary     Status: Abnormal   Collection Time: 04/25/24  9:19 PM  Result Value Ref Range   Glucose-Capillary 164 (H) 70 - 99 mg/dL    Comment: Glucose reference range applies only to samples taken after fasting for at least 8 hours.   Comment 1 Notify RN    Comment 2 Document in Chart   Basic metabolic panel with GFR     Status: Abnormal   Collection Time: 04/26/24  5:00 AM  Result Value Ref Range   Sodium 139 135 - 145 mmol/L   Potassium 3.0 (L) 3.5 - 5.1 mmol/L   Chloride 100 98 - 111 mmol/L   CO2 25 22 - 32 mmol/L   Glucose, Bld 119 (H) 70 - 99 mg/dL    Comment: Glucose reference range applies only to samples taken after fasting for at least 8 hours.   BUN 57 (H) 6 - 20 mg/dL   Creatinine, Ser 5.17 (H) 0.61 - 1.24 mg/dL   Calcium  9.1 8.9 - 10.3 mg/dL   GFR, Estimated 13 (L) >60 mL/min    Comment: (NOTE) Calculated using the CKD-EPI Creatinine Equation (2021)    Anion gap 14 5 - 15    Comment: Performed at [facility], [address], [place], [place] [address]  CBC     Status: Abnormal   Collection Time: 04/26/24  5:00 AM  Result  Value Ref Range   WBC 6.0 4.0 - 10.5 K/uL   RBC 2.95 (L) 4.22 - 5.81 MIL/uL   Hemoglobin 7.8 (L) 13.0 - 17.0 g/dL   HCT 75.5 (L) 60.9 - 47.9 %  MCV 82.7 80.0 - 100.0 fL   MCH 26.4 26.0 - 34.0 pg   MCHC 32.0 30.0 - 36.0 g/dL   RDW 84.0 (H) 88.4 - 84.4 %   Platelets 312 150 - 400 K/uL   nRBC 0.3 (H) 0.0 - 0.2 %    Comment: Performed at [facility], [address], [place], [place] [address]  Magnesium      Status: None   Collection Time: 04/26/24  5:00 AM  Result Value Ref Range   Magnesium  2.0 1.7 - 2.4 mg/dL    Comment: Performed at [facility], [address] [place], [place] [address]  Cooxemetry Panel (carboxy, met, total hgb, O2 sat)     Status: Abnormal   Collection Time: 04/26/24  5:48 AM  Result Value Ref Range   Total hemoglobin 8.3 (L) 12.0 - 16.0 g/dL   O2 Saturation 27.0 %   Carboxyhemoglobin 2.1 (H) 0.5 - 1.5 %   Methemoglobin <0.7 0.0 - 1.5 %    Comment: Performed at [facility], [address], [place], [place] [address]  Glucose, capillary     Status: Abnormal   Collection Time: 04/26/24  6:26 AM  Result Value Ref Range   Glucose-Capillary 120 (H) 70 - 99 mg/dL    Comment: Glucose reference range applies only to samples taken after fasting for at least 8 hours.   Comment 1 Notify RN    Comment 2 Document in Chart    No results found.   PMH:   Past Medical History:  Diagnosis Date   Blood in stool    bright red blood    Chicken pox    Diabetes mellitus (HCC)    Erectile dysfunction    Gout    Hypertension    Stroke [facility])     PSH:   Past Surgical History:  Procedure Laterality Date   APPENDECTOMY  2004   COLONOSCOPY     COLONOSCOPY N/A 04/22/2024   Procedure: COLONOSCOPY;  Surgeon: Stacia Glendia BRAVO, MD;  Location: [facility] ENDOSCOPY;  Service: Gastroenterology;  Laterality: N/A;   ENDOMYOCARDIAL BIOPSY N/A 03/16/2024   Procedure: ENDOMYOCARDIAL BIOPSY;  Surgeon: Zenaida Morene PARAS, MD;  Location: [facility] INVASIVE CV LAB;  Service:  Cardiovascular;  Laterality: N/A;   ESOPHAGOGASTRODUODENOSCOPY N/A 04/19/2024   Procedure: EGD (ESOPHAGOGASTRODUODENOSCOPY);  Surgeon: Stacia Glendia BRAVO, MD;  Location: [facility] ENDOSCOPY;  Service: Gastroenterology;  Laterality: N/A;   RIGHT HEART CATH N/A 03/16/2024   Procedure: RIGHT HEART CATH;  Surgeon: Zenaida Morene PARAS, MD;  Location: [facility] INVASIVE CV LAB;  Service: Cardiovascular;  Laterality: N/A;   RIGHT HEART CATH N/A 04/23/2024   Procedure: RIGHT HEART CATH;  Surgeon: Rolan Ezra RAMAN, MD;  Location: [facility] INVASIVE CV LAB;  Service: Cardiovascular;  Laterality: N/A;   TOOTH EXTRACTION  07/06/2020    Allergies: No Known Allergies  Medications:   Prior to Admission medications   Medication Sig Start Date End Date Taking? Authorizing Provider  apixaban  (ELIQUIS ) 5 MG TABS tablet Take 1 tablet (5 mg total) by mouth 2 (two) times daily. 04/08/24  Yes Hayes Beckey CROME, NP  bisoprolol  (ZEBETA ) 10 MG tablet Take 2 tablets (20 mg total) by mouth daily. 04/08/24  Yes Hayes Beckey CROME, NP  buPROPion  (WELLBUTRIN  XL) 300 MG 24 hr tablet TAKE 1 TABLET BY MOUTH ONCE  DAILY 04/13/24  Yes Nafziger, Darleene, NP  colchicine  0.6 MG tablet TAKE 1 TABLET BY MOUTH DAILY  WHEN HAVING FLARES TAKE 1 TABLET BY MOUTH  TWICE DAILY Patient taking differently: Take 0.6 mg by mouth as needed. TAKE 1 TABLET BY MOUTH  DAILY WHEN HAVING FLARES  TAKE 1 TABLET BY MOUTH  TWICE DAILY 09/05/23  Yes Nafziger, Darleene, NP  empagliflozin  (JARDIANCE ) 10 MG TABS tablet Take 1 tablet (10 mg total) by mouth daily. 04/08/24  Yes Hayes Beckey CROME, NP  furosemide  (LASIX ) 20 MG tablet Take 2 tablets (40 mg total) by mouth as needed (For increased swelling, weight gain of 3 LB in 1 day or 5 LB in 1 week). 04/08/24  Yes Hayes Beckey CROME, NP  hydrOXYzine (VISTARIL) 50 MG capsule Take 1 capsule (50 mg total) by mouth every 8 (eight) hours as needed. Patient taking differently: Take 50 mg by mouth every 8 (eight) hours as needed for itching. 03/23/24  Yes Nafziger,  Darleene, NP  magnesium  oxide (MAG-OX) 400 (240 Mg) MG tablet TAKE 1 TABLET(400 MG) BY MOUTH DAILY 02/12/24  Yes West, Katlyn D, NP  Multiple Vitamins-Minerals (ONE-A-DAY MENS 50+) TABS Take 1 tablet by mouth daily with breakfast.   Yes [provider]  omeprazole  (PRILOSEC) 40 MG capsule TAKE 1 CAPSULE BY MOUTH DAILY Patient taking differently: Take 40 mg by mouth daily before breakfast. 11/12/23  Yes Nafziger, Darleene, NP  potassium chloride  SA (KLOR-CON  M) 20 MEQ tablet Take 2 tablets (40 mEq total) by mouth daily as needed (take with lasix  as needed dose). 04/08/24  Yes Hayes Beckey CROME, NP  rosuvastatin  (CRESTOR ) 20 MG tablet Take 1 tablet (20 mg total) by mouth daily. 04/08/24  Yes Hayes Beckey CROME, NP  thiamine  (VITAMIN B-1) 100 MG tablet Take 1 tablet (100 mg total) by mouth daily. 11/11/22  Yes Toberman, Stevi W, NP  triamcinolone cream (KENALOG) 0.5 % Apply 1 Application topically 2 (two) times daily. 03/23/24  Yes Nafziger, Darleene, NP  blood glucose meter kit and supplies Dispense based on patient and insurance preference. Use up to four times daily as directed. (FOR ICD-9 250.00, 250.01). 07/09/16   Sebastian Toribio GAILS, MD  glucose blood (ONETOUCH ULTRA TEST) test strip Use to check blood sugar 2-3 times a day. 03/01/24   Trixie File, MD  Insulin  Pen Needle (B-D UF III MINI PEN NEEDLES) 31G X 5 MM MISC USE 4 TIMES DAILY AFTER MEALS AND AT BEDTIME 02/24/20   Trixie File, MD  Lancets [facility] ULTRASOFT) lancets Test blood sugars as directed. Dx E11.9 08/06/16   Nafziger, Cory, NP  nicotine  (NICODERM CQ  - DOSED IN MG/24 HR) 7 mg/24hr patch Place 1 patch (7 mg total) onto the skin daily. Patient not taking: Reported on 04/17/2024 03/19/24   Fairy Frames, MD  potassium chloride  (KLOR-CON ) 10 MEQ tablet TAKE 1 TABLET(10 MEQ) BY MOUTH DAILY Patient not taking: Reported on 04/17/2024 04/08/24   Hayes Beckey CROME, NP    Discontinued Meds:   Medications Discontinued During This Encounter  Medication  Reason   CLEAR EYES NATURAL TEARS 5-6 MG/ML SOLN No longer needed (for PRN medications)   allopurinol  (ZYLOPRIM ) 300 MG tablet Patient Preference   apixaban  (ELIQUIS ) tablet 5 mg    midodrine (PROAMATINE) tablet 10 mg    pantoprazole  (PROTONIX ) injection 40 mg P&T Policy: IV to PO Conversion   Na Sulfate-K Sulfate-Mg Sulfate concentrate (SUPREP) kit 177 mL    Na Sulfate-K Sulfate-Mg Sulfate concentrate (SUPREP) kit 177 mL    simethicone  (MYLICON) chewable tablet 240 mg    simethicone  (MYLICON) chewable tablet 240 mg    sodium chloride  0.9 % bolus 500 mL  midodrine (PROAMATINE) tablet 10 mg    furosemide  (LASIX ) 120 mg in dextrose  5 % 50 mL IVPB    DOBUTamine (DOBUTREX) infusion 4000 mcg/mL    furosemide  (LASIX ) 120 mg in dextrose  5 % 50 mL IVPB    Heparin  (Porcine) in NaCl 1000-0.9 UT/500ML-% SOLN Patient Transfer   lidocaine  (PF) (XYLOCAINE ) 1 % injection Patient Transfer   potassium chloride  SA (KLOR-CON  M) CR tablet 40 mEq    potassium chloride  SA (KLOR-CON  M) CR tablet 40 mEq     Social History:  reports that he quit smoking about 6 weeks ago. His smoking use included cigarettes and cigars. He has never used smokeless tobacco. He reports that he does not currently use alcohol. He reports that he does not currently use drugs after having used the following drugs: Marijuana.  Family History:   Family History  Problem Relation Age of Onset   Alcohol abuse Father    Hypertension Father    Heart disease Father    Gout Father    Breast cancer Mother    Hypertension Mother    Gout Mother    Healthy Sister    Healthy Daughter    Colon cancer Neg Hx    Esophageal cancer Neg Hx    Rectal cancer Neg Hx    Stomach cancer Neg Hx     Blood pressure (!) 87/55, pulse 62, temperature 98.3 F (36.8 C), temperature source Oral, resp. rate 18, height 6' 5 (1.956 m), weight 103.5 kg, SpO2 98%. Physical Exam: General exam: Appears calm and comfortable  Respiratory system: CTA b/l, no  rales Cardiovascular system: S1 & S2 heard, RRR.   Gastrointestinal system: Abdomen is nondistended, soft and nontender. Normal bowel sounds heard. Central nervous system: Alert and oriented. No focal neurological deficits. Extremities: LE edema  Skin: No rashes, lesions or ulcers     Elihu Milstein, LYNWOOD ORN, MD 04/26/2024, 7:55 AM

## 2024-04-27 ENCOUNTER — Other Ambulatory Visit: Payer: Self-pay | Admitting: Cardiology

## 2024-04-27 DIAGNOSIS — D62 Acute posthemorrhagic anemia: Secondary | ICD-10-CM | POA: Diagnosis not present

## 2024-04-27 DIAGNOSIS — Z515 Encounter for palliative care: Secondary | ICD-10-CM

## 2024-04-27 DIAGNOSIS — Z7189 Other specified counseling: Secondary | ICD-10-CM | POA: Diagnosis not present

## 2024-04-27 DIAGNOSIS — I5043 Acute on chronic combined systolic (congestive) and diastolic (congestive) heart failure: Secondary | ICD-10-CM | POA: Diagnosis not present

## 2024-04-27 DIAGNOSIS — I480 Paroxysmal atrial fibrillation: Secondary | ICD-10-CM

## 2024-04-27 LAB — CBC
HCT: 25.7 % — ABNORMAL LOW (ref 39.0–52.0)
Hemoglobin: 8.1 g/dL — ABNORMAL LOW (ref 13.0–17.0)
MCH: 26.3 pg (ref 26.0–34.0)
MCHC: 31.5 g/dL (ref 30.0–36.0)
MCV: 83.4 fL (ref 80.0–100.0)
Platelets: 320 K/uL (ref 150–400)
RBC: 3.08 MIL/uL — ABNORMAL LOW (ref 4.22–5.81)
RDW: 15.9 % — ABNORMAL HIGH (ref 11.5–15.5)
WBC: 6.9 K/uL (ref 4.0–10.5)
nRBC: 0 % (ref 0.0–0.2)

## 2024-04-27 LAB — COOXEMETRY PANEL
Carboxyhemoglobin: 1.1 % (ref 0.5–1.5)
Carboxyhemoglobin: 1.6 % — ABNORMAL HIGH (ref 0.5–1.5)
Carboxyhemoglobin: 0.6 % (ref 0.5–1.5)
Methemoglobin: <0.7 % (ref 0.0–1.5)
Methemoglobin: <0.7 % (ref 0.0–1.5)
Methemoglobin: 2.7 % — ABNORMAL HIGH (ref 0.0–1.5)
O2 Saturation: 33.3 %
O2 Saturation: 30 %
O2 Saturation: 63.3 %
Total hemoglobin: 8.8 g/dL — ABNORMAL LOW (ref 12.0–16.0)
Total hemoglobin: 8.4 g/dL — ABNORMAL LOW (ref 12.0–16.0)
Total hemoglobin: 8.7 g/dL — ABNORMAL LOW (ref 12.0–16.0)

## 2024-04-27 LAB — LACTIC ACID, PLASMA: Lactic Acid, Venous: 1.4 mmol/L (ref 0.5–1.9)

## 2024-04-27 LAB — GLUCOSE, CAPILLARY
Glucose-Capillary: 107 mg/dL — ABNORMAL HIGH (ref 70–99)
Glucose-Capillary: 114 mg/dL — ABNORMAL HIGH (ref 70–99)
Glucose-Capillary: 122 mg/dL — ABNORMAL HIGH (ref 70–99)
Glucose-Capillary: 102 mg/dL — ABNORMAL HIGH (ref 70–99)

## 2024-04-27 LAB — BASIC METABOLIC PANEL WITH GFR
Anion gap: 12 (ref 5–15)
BUN: 55 mg/dL — ABNORMAL HIGH (ref 6–20)
CO2: 26 mmol/L (ref 22–32)
Calcium: 8.9 mg/dL (ref 8.9–10.3)
Chloride: 99 mmol/L (ref 98–111)
Creatinine, Ser: 4.97 mg/dL — ABNORMAL HIGH (ref 0.61–1.24)
GFR, Estimated: 13 mL/min — ABNORMAL LOW (ref >60)
Glucose, Bld: 111 mg/dL — ABNORMAL HIGH (ref 70–99)
Potassium: 3.2 mmol/L — ABNORMAL LOW (ref 3.5–5.1)
Sodium: 137 mmol/L (ref 135–145)

## 2024-04-27 MED ORDER — POTASSIUM CHLORIDE CRYS ER 20 MEQ PO TBCR
40.0000 meq | EXTENDED_RELEASE_TABLET | Freq: Once | ORAL | Status: AC
  Administered 2024-04-27: 40 meq via ORAL
  Filled 2024-04-27: qty 2

## 2024-04-27 MED ORDER — METOLAZONE 2.5 MG PO TABS
2.5000 mg | ORAL_TABLET | Freq: Once | ORAL | Status: AC
  Administered 2024-04-27: 2.5 mg via ORAL
  Filled 2024-04-27: qty 1

## 2024-04-27 MED ORDER — TRAZODONE HCL 50 MG PO TABS
50.0000 mg | ORAL_TABLET | Freq: Every evening | ORAL | Status: DC | PRN
  Administered 2024-04-27: 50 mg via ORAL
  Filled 2024-04-27: qty 1

## 2024-04-27 MED ORDER — SODIUM CHLORIDE 0.9 % IV SOLN
200.0000 mg | INTRAVENOUS | Status: AC
  Administered 2024-04-27 – 2024-04-30 (×4): 200 mg via INTRAVENOUS
  Filled 2024-04-27 (×3): qty 10
  Filled 2024-04-27: qty 200

## 2024-04-27 NOTE — Telephone Encounter (Signed)
 Prescription refill request for Eliquis  received. Indication:afib Last office visit:6/25 Scr:4.97  11/25 Age: 58 Weight:101.7  kg  Prescription refilled

## 2024-04-27 NOTE — Progress Notes (Signed)
 Nykolas Bacallao is an 58 y.o. male hypertension, dyslipidemia, T2DM, afib on Eliquis , CHF with grade third diastolic dysfunction/concentric LVH who was presented with symptomatic anemia, acute blood loss anemia seen as a consultation for the evaluation of AKI on CKD. Worsening SOB, DOE, BRBPR. Hypotensive in ED. EGD showed congested friable gastric mucosa likely the source of bleeding and colonoscopy on 10/30 showed hemorrhoids.  He was also managed for acute on chronic CHF exacerbation, EF was around 45% however grade third diastolic dysfunction.  Seen by cardiology team and placed on IV diuretics.    For CKD, patient was seen once at [facility] 2023; recent serum creatinine level fluctuating anywhere around 1.4-2.5. He has hypokalemia, noted albumin level of 2.4. No recent NSAID use and no IV contrast use. Jardiance  which is currently on hold.  He takes furosemide  40 mg at home.  Assessment/Plan: # Acute kidney injury on CKDIIIb-IV ( b/lcr 1.5-2.5): Acute kidney injury likely due to reduced renal perfusion (low effective circulatory volume) caused by persistent hypotension, severe anemia, ongoing use of diuretics for CHF.  Not on ACE inhibitor, ARB and no recent use of IV contrast. Appears to be CRS; hopefully did not develop ATN from hyperperfusion and hypotension.  -Currently on furosemide  120 mg twice daily with dobutamine started 10/31 evening.   He does have peripheral edema with low serum albumin level. Renal function had started to improve with the inotropic support but no change in Cr now; still duresing well with 3.85 L UOP /24hrs but unfortunately Co-ox low with lower dose DBA -> increase per advanced heart failure team.  Dr. Cherrie to d/w [facility] transplant team  - Continue with strict ins and out, daily lab.  -Kidney ultrasound neg for r/o obstruction (he does c/o feeling full after voiding); will check a PVR bladder scan as well - Continue to avoid ACE inhibitor, ARB or SGLT 2 inhibitor.   #  Acute blood loss anemia due to GI bleed, symptomatic.  Seen by gastroenterologist and underwent upper and lower GI endoscopy.  Received multiple units of blood transfusion with improvement of hemoglobin.  Back on anticoagulation now. S/p resection of polyps + hemorrhoids but were not actively bldg - Severely iron deficient -> will load w IV iron and then after a few doses ESA   # Persistent hypotension:   Not on any antihypertensives.  Agree with midodrine and rx with albumin.   # Acute on chronic CHF with EF of 45 to 50%, diastolic dysfunction: Right heart cath 10/31 showed high right sided pressures now on dobutamine resulting in much improved UOP   # Metabolic acidosis: CO2 20, trend lab.  Subjective: Denies f/c/n/v/sob; he feels the lower ext edema is much better than previously. He has a better outlook today especially as his renal function is improving.  was very down yest with no improvement in renal function but better spirits today.  Co-Ox  33 (confirmed)   Chemistry and CBC: Creat  Date/Time Value Ref Range Status  10/23/2023 10:05 AM 1.56 (H) 0.70 - 1.30 mg/dL Final  91/95/7978 90:93 AM 1.37 (H) 0.70 - 1.33 mg/dL Final    Comment:    For patients >24 years of age, the reference limit for Creatinine is approximately 13% higher for people identified as African-American. .    Creatinine, Ser  Date/Time Value Ref Range Status  04/27/2024 05:35 AM 4.97 (H) 0.61 - 1.24 mg/dL Final  88/96/7974 95:49 PM 4.88 (H) 0.61 - 1.24 mg/dL Final  88/96/7974 94:99 AM 4.82 (H)  0.61 - 1.24 mg/dL Final  88/97/7974 94:52 AM 5.17 (H) 0.61 - 1.24 mg/dL Final  88/98/7974 94:99 AM 5.08 (H) 0.61 - 1.24 mg/dL Final  89/68/7974 95:54 AM 4.82 (H) 0.61 - 1.24 mg/dL Final  89/69/7974 95:95 AM 4.49 (H) 0.61 - 1.24 mg/dL Final  89/70/7974 96:80 AM 4.25 (H) 0.61 - 1.24 mg/dL Final  89/71/7974 95:64 AM 4.00 (H) 0.61 - 1.24 mg/dL Final  89/72/7974 95:52 AM 3.97 (H) 0.61 - 1.24 mg/dL Final  89/74/7974 94:95  PM 3.13 (H) 0.61 - 1.24 mg/dL Final  89/98/7974 95:75 PM 2.47 (H) 0.61 - 1.24 mg/dL Final  90/69/7974 88:61 AM 2.42 (H) 0.40 - 1.50 mg/dL Final  90/74/7974 96:42 AM 2.88 (H) 0.61 - 1.24 mg/dL Final  90/75/7974 94:43 AM 3.05 (H) 0.61 - 1.24 mg/dL Final  90/76/7974 95:63 AM 2.79 (H) 0.61 - 1.24 mg/dL Final  90/77/7974 94:53 AM 3.08 (H) 0.61 - 1.24 mg/dL Final  90/78/7974 96:61 AM 3.16 (H) 0.61 - 1.24 mg/dL Final  90/79/7974 95:81 AM 2.63 (H) 0.61 - 1.24 mg/dL Final  90/80/7974 90:57 AM 2.53 (H) 0.61 - 1.24 mg/dL Final  93/73/7974 90:73 AM 1.46 (H) 0.76 - 1.27 mg/dL Final  89/91/7975 91:48 AM 1.60 (H) 0.76 - 1.27 mg/dL Final  93/74/7975 91:57 AM 1.47 0.40 - 1.50 mg/dL Final  94/77/7975 88:99 AM 1.74 (H) 0.40 - 1.50 mg/dL Final  94/80/7975 96:78 AM 1.29 (H) 0.61 - 1.24 mg/dL Final  94/81/7975 96:88 PM 1.37 (H) 0.61 - 1.24 mg/dL Final  94/81/7975 87:54 AM 1.54 (H) 0.61 - 1.24 mg/dL Final  94/82/7975 89:47 PM 1.80 (H) 0.61 - 1.24 mg/dL Final  94/82/7975 89:48 PM 1.66 (H) 0.61 - 1.24 mg/dL Final  96/72/7976 87:41 PM 1.60 (H) 0.40 - 1.50 mg/dL Final  96/77/7976 89:92 AM 1.75 (H) 0.40 - 1.50 mg/dL Final  96/97/7977 90:65 AM 1.41 0.40 - 1.50 mg/dL Final  98/79/7977 97:50 PM 1.35 (H) 0.76 - 1.27 mg/dL Final  90/98/7978 88:53 AM 1.27 0.76 - 1.27 mg/dL Final  87/96/7979 96:86 PM 1.25 0.40 - 1.50 mg/dL Final  87/73/7980 97:82 PM 1.32 (H) 0.61 - 1.24 mg/dL Final  89/88/7980 91:71 AM 1.34 0.40 - 1.50 mg/dL Final  88/84/7981 89:99 AM 1.17 0.40 - 1.50 mg/dL Final  98/76/7981 97:50 PM 1.11 0.40 - 1.50 mg/dL Final  98/83/7981 93:64 AM 1.18 0.61 - 1.24 mg/dL Final  98/84/7981 91:86 AM 1.74 (H) 0.61 - 1.24 mg/dL Final  98/84/7981 87:45 AM 1.78 (H) 0.61 - 1.24 mg/dL Final  98/85/7981 90:80 PM 1.85 (H) 0.61 - 1.24 mg/dL Final  98/85/7981 93:94 PM 2.31 (H) 0.61 - 1.24 mg/dL Final  98/85/7981 89:40 AM 2.76 (H) 0.61 - 1.24 mg/dL Final  88/71/7982 88:88 AM 1.18 0.40 - 1.50 mg/dL Final  87/83/7983 90:63 AM  1.15 0.40 - 1.50 mg/dL Final   Recent Labs  Lab 04/22/24 0404 04/23/24 0445 04/23/24 1058 04/23/24 1059 04/24/24 0500 04/25/24 0547 04/26/24 0500 04/26/24 1650 04/27/24 0535  NA 139 138 139 139 140 135 139 138 137  K 4.0 3.6 3.7 3.8 3.4* 3.3* 3.0* 3.1* 3.2*  CL 102 102  --   --  103 100 100 98 99  CO2 20* 18*  --   --  20* 20* 25 25 26   GLUCOSE 157* 133*  --   --  139* 147* 119* 101* 111*  BUN 71* 70*  --   --  67* 63* 57* 55* 55*  CREATININE 4.49* 4.82*  --   --  5.08* 5.17* 4.82*  4.88* 4.97*  CALCIUM  8.8* 8.9  --   --  9.1 8.8* 9.1 9.2 8.9   Recent Labs  Lab 04/24/24 0500 04/25/24 0547 04/26/24 0500 04/27/24 0535  WBC 7.9 7.6 6.0 6.9  HGB 8.3* 8.2* 7.8* 8.1*  HCT 25.8* 26.0* 24.4* 25.7*  MCV 83.2 83.3 82.7 83.4  PLT 332 331 312 320   Liver Function Tests: Recent Labs  Lab 04/23/24 0445  ALBUMIN 2.9*   No results for input(s): LIPASE, AMYLASE in the last 168 hours. No results for input(s): AMMONIA in the last 168 hours. Cardiac Enzymes: No results for input(s): CKTOTAL, CKMB, CKMBINDEX, TROPONINI in the last 168 hours. Iron Studies:  No results for input(s): IRON, TIBC, TRANSFERRIN, FERRITIN in the last 72 hours.  PT/INR: @LABRCNTIP (inr:5)  Xrays/Other Studies: ) Results for orders placed or performed during the hospital encounter of 04/17/24 (from the past 48 hours)  Glucose, capillary     Status: Abnormal   Collection Time: 04/25/24  4:24 PM  Result Value Ref Range   Glucose-Capillary 116 (H) 70 - 99 mg/dL    Comment: Glucose reference range applies only to samples taken after fasting for at least 8 hours.   Comment 1 Notify RN    Comment 2 Document in Chart   Glucose, capillary     Status: Abnormal   Collection Time: 04/25/24  9:19 PM  Result Value Ref Range   Glucose-Capillary 164 (H) 70 - 99 mg/dL    Comment: Glucose reference range applies only to samples taken after fasting for at least 8 hours.   Comment 1 Notify RN     Comment 2 Document in Chart   Basic metabolic panel with GFR     Status: Abnormal   Collection Time: 04/26/24  5:00 AM  Result Value Ref Range   Sodium 139 135 - 145 mmol/L   Potassium 3.0 (L) 3.5 - 5.1 mmol/L   Chloride 100 98 - 111 mmol/L   CO2 25 22 - 32 mmol/L   Glucose, Bld 119 (H) 70 - 99 mg/dL    Comment: Glucose reference range applies only to samples taken after fasting for at least 8 hours.   BUN 57 (H) 6 - 20 mg/dL   Creatinine, Ser 5.17 (H) 0.61 - 1.24 mg/dL   Calcium  9.1 8.9 - 10.3 mg/dL   GFR, Estimated 13 (L) >60 mL/min    Comment: (NOTE) Calculated using the CKD-EPI Creatinine Equation (2021)    Anion gap 14 5 - 15    Comment: Performed at [facility], [address] [place], [place] [address]  CBC     Status: Abnormal   Collection Time: 04/26/24  5:00 AM  Result Value Ref Range   WBC 6.0 4.0 - 10.5 K/uL   RBC 2.95 (L) 4.22 - 5.81 MIL/uL   Hemoglobin 7.8 (L) 13.0 - 17.0 g/dL   HCT 75.5 (L) 60.9 - 47.9 %   MCV 82.7 80.0 - 100.0 fL   MCH 26.4 26.0 - 34.0 pg   MCHC 32.0 30.0 - 36.0 g/dL   RDW 84.0 (H) 88.4 - 84.4 %   Platelets 312 150 - 400 K/uL   nRBC 0.3 (H) 0.0 - 0.2 %    Comment: Performed at [facility], [address], [place], [place] [address]  Magnesium      Status: None   Collection Time: 04/26/24  5:00 AM  Result Value Ref Range   Magnesium  2.0 1.7 - 2.4 mg/dL    Comment:  Performed at [facility], [address] [place], [place] [address]  Cooxemetry Panel (carboxy, met, total hgb, O2 sat)     Status: Abnormal   Collection Time: 04/26/24  5:48 AM  Result Value Ref Range   Total hemoglobin 8.3 (L) 12.0 - 16.0 g/dL   O2 Saturation 27.0 %   Carboxyhemoglobin 2.1 (H) 0.5 - 1.5 %   Methemoglobin <0.7 0.0 - 1.5 %    Comment: Performed at [facility], [address] [place], [place] [address]  Glucose, capillary     Status: Abnormal   Collection Time: 04/26/24  6:26 AM  Result Value Ref Range   Glucose-Capillary 120 (H)  70 - 99 mg/dL    Comment: Glucose reference range applies only to samples taken after fasting for at least 8 hours.   Comment 1 Notify RN    Comment 2 Document in Chart   Glucose, capillary     Status: Abnormal   Collection Time: 04/26/24 11:27 AM  Result Value Ref Range   Glucose-Capillary 132 (H) 70 - 99 mg/dL    Comment: Glucose reference range applies only to samples taken after fasting for at least 8 hours.  Glucose, capillary     Status: None   Collection Time: 04/26/24  4:24 PM  Result Value Ref Range   Glucose-Capillary 97 70 - 99 mg/dL    Comment: Glucose reference range applies only to samples taken after fasting for at least 8 hours.  Basic metabolic panel with GFR     Status: Abnormal   Collection Time: 04/26/24  4:50 PM  Result Value Ref Range   Sodium 138 135 - 145 mmol/L   Potassium 3.1 (L) 3.5 - 5.1 mmol/L   Chloride 98 98 - 111 mmol/L   CO2 25 22 - 32 mmol/L   Glucose, Bld 101 (H) 70 - 99 mg/dL    Comment: Glucose reference range applies only to samples taken after fasting for at least 8 hours.   BUN 55 (H) 6 - 20 mg/dL   Creatinine, Ser 5.11 (H) 0.61 - 1.24 mg/dL   Calcium  9.2 8.9 - 10.3 mg/dL   GFR, Estimated 13 (L) >60 mL/min    Comment: (NOTE) Calculated using the CKD-EPI Creatinine Equation (2021)    Anion gap 15 5 - 15    Comment: Performed at [facility], [address] [place], [place] [address]  Glucose, capillary     Status: Abnormal   Collection Time: 04/26/24  9:21 PM  Result Value Ref Range   Glucose-Capillary 165 (H) 70 - 99 mg/dL    Comment: Glucose reference range applies only to samples taken after fasting for at least 8 hours.   Comment 1 Notify RN    Comment 2 Document in Chart   Cooxemetry Panel (carboxy, met, total hgb, O2 sat)     Status: Abnormal   Collection Time: 04/27/24  5:35 AM  Result Value Ref Range   Total hemoglobin 8.8 (L) 12.0 - 16.0 g/dL   O2 Saturation 66.6 %   Carboxyhemoglobin 1.1 0.5 - 1.5 %   Methemoglobin  <0.7 0.0 - 1.5 %    Comment: Performed at [facility], [address], [place], [place] [address]  Basic metabolic panel with GFR     Status: Abnormal   Collection Time: 04/27/24  5:35 AM  Result Value Ref Range   Sodium 137 135 - 145 mmol/L   Potassium 3.2 (L) 3.5 - 5.1 mmol/L  Chloride 99 98 - 111 mmol/L   CO2 26 22 - 32 mmol/L   Glucose, Bld 111 (H) 70 - 99 mg/dL    Comment: Glucose reference range applies only to samples taken after fasting for at least 8 hours.   BUN 55 (H) 6 - 20 mg/dL   Creatinine, Ser 5.02 (H) 0.61 - 1.24 mg/dL   Calcium  8.9 8.9 - 10.3 mg/dL   GFR, Estimated 13 (L) >60 mL/min    Comment: (NOTE) Calculated using the CKD-EPI Creatinine Equation (2021)    Anion gap 12 5 - 15    Comment: Performed at [facility], [address], [place], [place] [address]  CBC     Status: Abnormal   Collection Time: 04/27/24  5:35 AM  Result Value Ref Range   WBC 6.9 4.0 - 10.5 K/uL   RBC 3.08 (L) 4.22 - 5.81 MIL/uL   Hemoglobin 8.1 (L) 13.0 - 17.0 g/dL   HCT 74.2 (L) 60.9 - 47.9 %   MCV 83.4 80.0 - 100.0 fL   MCH 26.3 26.0 - 34.0 pg   MCHC 31.5 30.0 - 36.0 g/dL   RDW 84.0 (H) 88.4 - 84.4 %   Platelets 320 150 - 400 K/uL   nRBC 0.0 0.0 - 0.2 %    Comment: Performed at [facility], [address], [place], [place] [address]  Glucose, capillary     Status: Abnormal   Collection Time: 04/27/24  6:04 AM  Result Value Ref Range   Glucose-Capillary 107 (H) 70 - 99 mg/dL    Comment: Glucose reference range applies only to samples taken after fasting for at least 8 hours.   Comment 1 Notify RN    Comment 2 Document in Chart   Cooxemetry Panel (carboxy, met, total hgb, O2 sat)     Status: Abnormal   Collection Time: 04/27/24  7:00 AM  Result Value Ref Range   Total hemoglobin 8.4 (L) 12.0 - 16.0 g/dL   O2 Saturation 30 %   Carboxyhemoglobin 1.6 (H) 0.5 - 1.5 %   Methemoglobin <0.7 0.0 - 1.5 %    Comment: Performed at [facility], [address] [place], [place] [address]  Lactic acid, plasma     Status: None   Collection Time: 04/27/24  8:22 AM  Result Value Ref Range   Lactic Acid, Venous 1.4 0.5 - 1.9 mmol/L    Comment: Performed at [facility], [address] [place], [place] [address]  Cooxemetry Panel (carboxy, met, total hgb, O2 sat)     Status: Abnormal   Collection Time: 04/27/24  8:44 AM  Result Value Ref Range   Total hemoglobin 8.7 (L) 12.0 - 16.0 g/dL   O2 Saturation 36.6 %   Carboxyhemoglobin 0.6 0.5 - 1.5 %   Methemoglobin 2.7 (H) 0.0 - 1.5 %    Comment: Performed at [facility], [address], [place], [place] [address]  Glucose, capillary     Status: Abnormal   Collection Time: 04/27/24 11:18 AM  Result Value Ref Range   Glucose-Capillary 114 (H) 70 - 99 mg/dL    Comment: Glucose reference range applies only to samples taken after fasting for at least 8 hours.   No results found.   PMH:   Past Medical History:  Diagnosis Date   Blood in stool    bright red blood    Chicken pox    Diabetes mellitus (HCC)  Erectile dysfunction    Gout    Hypertension    Stroke [facility])     PSH:   Past Surgical History:  Procedure Laterality Date   APPENDECTOMY  2004   COLONOSCOPY     COLONOSCOPY N/A 04/22/2024   Procedure: COLONOSCOPY;  Surgeon: Stacia Glendia BRAVO, MD;  Location: [facility] ENDOSCOPY;  Service: Gastroenterology;  Laterality: N/A;   ENDOMYOCARDIAL BIOPSY N/A 03/16/2024   Procedure: ENDOMYOCARDIAL BIOPSY;  Surgeon: Zenaida Morene PARAS, MD;  Location: [facility] INVASIVE CV LAB;  Service: Cardiovascular;  Laterality: N/A;   ESOPHAGOGASTRODUODENOSCOPY N/A 04/19/2024   Procedure: EGD (ESOPHAGOGASTRODUODENOSCOPY);  Surgeon: Stacia Glendia BRAVO, MD;  Location: [facility] ENDOSCOPY;  Service: Gastroenterology;  Laterality: N/A;   RIGHT HEART CATH N/A 03/16/2024   Procedure: RIGHT HEART CATH;  Surgeon: Zenaida Morene PARAS, MD;  Location: [facility] INVASIVE CV LAB;  Service: Cardiovascular;  Laterality: N/A;   RIGHT  HEART CATH N/A 04/23/2024   Procedure: RIGHT HEART CATH;  Surgeon: Rolan Ezra RAMAN, MD;  Location: [facility] INVASIVE CV LAB;  Service: Cardiovascular;  Laterality: N/A;   TOOTH EXTRACTION  07/06/2020    Allergies: No Known Allergies  Medications:   Prior to Admission medications   Medication Sig Start Date End Date Taking? Authorizing Provider  apixaban  (ELIQUIS ) 5 MG TABS tablet Take 1 tablet (5 mg total) by mouth 2 (two) times daily. 04/08/24  Yes Hayes Beckey CROME, NP  bisoprolol  (ZEBETA ) 10 MG tablet Take 2 tablets (20 mg total) by mouth daily. 04/08/24  Yes Hayes Beckey CROME, NP  buPROPion  (WELLBUTRIN  XL) 300 MG 24 hr tablet TAKE 1 TABLET BY MOUTH ONCE  DAILY 04/13/24  Yes Nafziger, Darleene, NP  colchicine  0.6 MG tablet TAKE 1 TABLET BY MOUTH DAILY  WHEN HAVING FLARES TAKE 1 TABLET BY MOUTH TWICE DAILY Patient taking differently: Take 0.6 mg by mouth as needed. TAKE 1 TABLET BY MOUTH  DAILY WHEN HAVING FLARES  TAKE 1 TABLET BY MOUTH  TWICE DAILY 09/05/23  Yes Nafziger, Darleene, NP  empagliflozin  (JARDIANCE ) 10 MG TABS tablet Take 1 tablet (10 mg total) by mouth daily. 04/08/24  Yes Hayes Beckey CROME, NP  furosemide  (LASIX ) 20 MG tablet Take 2 tablets (40 mg total) by mouth as needed (For increased swelling, weight gain of 3 LB in 1 day or 5 LB in 1 week). 04/08/24  Yes Hayes Beckey CROME, NP  hydrOXYzine (VISTARIL) 50 MG capsule Take 1 capsule (50 mg total) by mouth every 8 (eight) hours as needed. Patient taking differently: Take 50 mg by mouth every 8 (eight) hours as needed for itching. 03/23/24  Yes Nafziger, Darleene, NP  magnesium  oxide (MAG-OX) 400 (240 Mg) MG tablet TAKE 1 TABLET(400 MG) BY MOUTH DAILY 02/12/24  Yes West, Katlyn D, NP  Multiple Vitamins-Minerals (ONE-A-DAY MENS 50+) TABS Take 1 tablet by mouth daily with breakfast.   Yes [provider]  omeprazole  (PRILOSEC) 40 MG capsule TAKE 1 CAPSULE BY MOUTH DAILY Patient taking differently: Take 40 mg by mouth daily before breakfast. 11/12/23  Yes  Nafziger, Darleene, NP  potassium chloride  SA (KLOR-CON  M) 20 MEQ tablet Take 2 tablets (40 mEq total) by mouth daily as needed (take with lasix  as needed dose). 04/08/24  Yes Hayes Beckey CROME, NP  rosuvastatin  (CRESTOR ) 20 MG tablet Take 1 tablet (20 mg total) by mouth daily. 04/08/24  Yes Hayes Beckey CROME, NP  thiamine  (VITAMIN B-1) 100 MG tablet Take 1 tablet (100 mg total) by mouth daily. 11/11/22  Yes Denna Mimi ORN, NP  triamcinolone cream (KENALOG) 0.5 % Apply 1 Application topically 2 (two) times daily. 03/23/24  Yes Nafziger, Darleene, NP  blood glucose meter kit and supplies Dispense based on patient and insurance preference. Use up to four times daily as directed. (FOR ICD-9 250.00, 250.01). 07/09/16   Sebastian Toribio GAILS, MD  glucose blood (ONETOUCH ULTRA TEST) test strip Use to check blood sugar 2-3 times a day. 03/01/24   Trixie File, MD  Insulin  Pen Needle (B-D UF III MINI PEN NEEDLES) 31G X 5 MM MISC USE 4 TIMES DAILY AFTER MEALS AND AT BEDTIME 02/24/20   Trixie File, MD  Lancets [facility] ULTRASOFT) lancets Test blood sugars as directed. Dx E11.9 08/06/16   Nafziger, Cory, NP  nicotine  (NICODERM CQ  - DOSED IN MG/24 HR) 7 mg/24hr patch Place 1 patch (7 mg total) onto the skin daily. Patient not taking: Reported on 04/17/2024 03/19/24   Fairy Frames, MD  potassium chloride  (KLOR-CON ) 10 MEQ tablet TAKE 1 TABLET(10 MEQ) BY MOUTH DAILY Patient not taking: Reported on 04/17/2024 04/08/24   Hayes Beckey CROME, NP    Discontinued Meds:   Medications Discontinued During This Encounter  Medication Reason   CLEAR EYES NATURAL TEARS 5-6 MG/ML SOLN No longer needed (for PRN medications)   allopurinol  (ZYLOPRIM ) 300 MG tablet Patient Preference   apixaban  (ELIQUIS ) tablet 5 mg    midodrine (PROAMATINE) tablet 10 mg    pantoprazole  (PROTONIX ) injection 40 mg P&T Policy: IV to PO Conversion   Na Sulfate-K Sulfate-Mg Sulfate concentrate (SUPREP) kit 177 mL    Na Sulfate-K Sulfate-Mg Sulfate concentrate  (SUPREP) kit 177 mL    simethicone  (MYLICON) chewable tablet 240 mg    simethicone  (MYLICON) chewable tablet 240 mg    sodium chloride  0.9 % bolus 500 mL    midodrine (PROAMATINE) tablet 10 mg    furosemide  (LASIX ) 120 mg in dextrose  5 % 50 mL IVPB    DOBUTamine (DOBUTREX) infusion 4000 mcg/mL    furosemide  (LASIX ) 120 mg in dextrose  5 % 50 mL IVPB    Heparin  (Porcine) in NaCl 1000-0.9 UT/500ML-% SOLN Patient Transfer   lidocaine  (PF) (XYLOCAINE ) 1 % injection Patient Transfer   potassium chloride  SA (KLOR-CON  M) CR tablet 40 mEq    potassium chloride  SA (KLOR-CON  M) CR tablet 40 mEq    potassium chloride  SA (KLOR-CON  M) CR tablet 40 mEq    0.9 %  sodium chloride  infusion (Manually program via Guardrails IV Fluids) One time medication   iron sucrose (VENOFER) 200 mg in sodium chloride  0.9 % 100 mL IVPB Inpatient Standard    Social History:  reports that he quit smoking about 6 weeks ago. His smoking use included cigarettes and cigars. He has never used smokeless tobacco. He reports that he does not currently use alcohol. He reports that he does not currently use drugs after having used the following drugs: Marijuana.  Family History:   Family History  Problem Relation Age of Onset   Alcohol abuse Father    Hypertension Father    Heart disease Father    Gout Father    Breast cancer Mother    Hypertension Mother    Gout Mother    Healthy Sister    Healthy Daughter    Colon cancer Neg Hx    Esophageal cancer Neg Hx    Rectal cancer Neg Hx    Stomach cancer Neg Hx     Blood pressure 91/61, pulse 64, temperature 97.8 F (36.6 C), temperature source Oral, resp. rate  20, height 6' 5 (1.956 m), weight 101.7 kg, SpO2 96%. Physical Exam: General exam: Appears calm and comfortable  Respiratory system: CTA b/l, no rales Cardiovascular system: S1 & S2 heard, RRR.   Gastrointestinal system: Abdomen is nondistended, soft and nontender. Normal bowel sounds heard. Central nervous system:  Alert and oriented. No focal neurological deficits. Extremities: LE edema  Skin: No rashes, lesions or ulcers     Blimi Godby, LYNWOOD ORN, MD 04/27/2024, 1:36 PM

## 2024-04-27 NOTE — Plan of Care (Signed)
  Problem: Education: Goal: Knowledge of General Education information will improve Description: Including pain rating scale, medication(s)/side effects and non-pharmacologic comfort measures Outcome: Progressing   Problem: Clinical Measurements: Goal: Will remain free from infection Outcome: Progressing   Problem: Clinical Measurements: Goal: Diagnostic test results will improve Outcome: Progressing   Problem: Clinical Measurements: Goal: Cardiovascular complication will be avoided Outcome: Progressing   Problem: Nutrition: Goal: Adequate nutrition will be maintained Outcome: Progressing   Problem: Coping: Goal: Level of anxiety will decrease Outcome: Progressing   Problem: Pain Managment: Goal: General experience of comfort will improve and/or be controlled Outcome: Progressing   Problem: Safety: Goal: Ability to remain free from injury will improve Outcome: Progressing   Problem: Skin Integrity: Goal: Risk for impaired skin integrity will decrease Outcome: Progressing   Problem: Education: Goal: Ability to describe self-care measures that may prevent or decrease complications (Diabetes Survival Skills Education) will improve Outcome: Progressing   Problem: Metabolic: Goal: Ability to maintain appropriate glucose levels will improve Outcome: Progressing

## 2024-04-27 NOTE — Discharge Instructions (Signed)

## 2024-04-27 NOTE — Progress Notes (Addendum)
 Patient ID: Keith Leblanc, male   DOB: [birth year], 58 y.o.   MRN: [id number]     Advanced Heart Failure Rounding Note  Cardiologist: None  Chief Complaint: A/c HFmrEF, GIB  Patient Profile   58 y/o male w/ prior h/o heavy ETOH use (recently quit), HFmrEF, CKDIIIa, PAF on Eliquis , HTN, HLD and Type 2DM, admitted w/ symptomatic ABLA 2/2 GIB. HF management c/b persistent hypotension and AKI despite adequate volume resuscitation. AHF team asked to assist.   Subjective:    Echo this admit EF 40-45% (previously 45-50%), severe LVH, RV mod reduced, RVSP 42, dilated IVC estimated RAP ~15 mmHg    CO-OX 33% on DBA 2.5 mcg Creatinine 4.8>5.  Brisk diuresis noted.    Denies SOB   Objective:    Weight Range: 101.7 kg Body mass index is 26.57 kg/m.   Vital Signs:   Temp:  [97.8 F (36.6 C)-98.4 F (36.9 C)] (P) 98.4 F (36.9 C) (11/04 0606) Pulse Rate:  [61-70] 61 (11/04 0606) Resp:  [13-21] 21 (11/04 0606) BP: (87-102)/(55-72) 95/63 (11/04 0606) SpO2:  [94 %-100 %] 100 % (11/04 0606) Weight:  [101.7 kg-103.5 kg] 101.7 kg (11/04 0500) Last BM Date : 04/27/24  Weight change: Filed Weights   04/25/24 0500 04/26/24 0700 04/27/24 0500  Weight: 106.5 kg 103.5 kg 101.7 kg   Intake/Output:  Intake/Output Summary (Last 24 hours) at 04/27/2024 0647 Last data filed at 04/27/2024 0500 Gross per 24 hour  Intake 822.24 ml  Output 3600 ml  Net -2777.76 ml    Physical Exam  General:   No resp difficulty Neck: JVP 10-11 LIJ.  Cor: Irregular rate & rhythm.  Lungs: clear Abdomen: soft, nontender, nondistended.  Extremities: no  edema Neuro: alert & oriented x3 .  EKG   A Fib 60s with NSVT.   Labs    CBC Recent Labs    04/26/24 0500 04/27/24 0535  WBC 6.0 6.9  HGB 7.8* 8.1*  HCT 24.4* 25.7*  MCV 82.7 83.4  PLT 312 320   Basic Metabolic Panel Recent Labs    88/97/74 0900 04/26/24 0500 04/26/24 1650 04/27/24 0535  NA  --  139 138 137  K  --  3.0* 3.1* 3.2*  CL   --  100 98 99  CO2  --  25 25 26   GLUCOSE  --  119* 101* 111*  BUN  --  57* 55* 55*  CREATININE  --  4.82* 4.88* 4.97*  CALCIUM   --  9.1 9.2 8.9  MG 1.8 2.0  --   --    BNP (last 3 results) Recent Labs    03/24/24 1624 04/17/24 1704  BNP 1,093.8* 1,324.0*   ProBNP (last 3 results) Recent Labs    03/12/24 0942  PROBNP 5,364.0*   Medications:    Scheduled Medications:  apixaban   5 mg Oral BID   buPROPion   300 mg Oral Daily   Chlorhexidine  Gluconate Cloth  6 each Topical Daily   insulin  aspart  0-9 Units Subcutaneous TID WC   midodrine  15 mg Oral TID WC   pantoprazole   40 mg Oral BID   potassium chloride   40 mEq Oral BID   rosuvastatin   20 mg Oral Daily   simethicone   240 mg Oral Once   sodium chloride  flush  10-40 mL Intracatheter Q12H    Infusions:  DOBUTamine 2.5 mcg/kg/min (04/25/24 1421)   furosemide  120 mg (04/26/24 1730)   iron sucrose      PRN Medications: acetaminophen  **OR** acetaminophen ,  alum & mag hydroxide-simeth, ondansetron  **OR** ondansetron  (ZOFRAN ) IV, sodium chloride  flush, traMADol  Assessment/Plan   1. Acute on chronic HF with mid range EF: Echo in 8/25 showed EF 45-50%, severe LVH, abnormal RV free wall strain, ~17%, RV mildly reduced.  Cardiac MRI showed moderate concentric LVH with EF 43%, RV EF 47%, ECV 36%, diffuse patchy LGE throughout LV myocardium. PYP scan negative.  He had an endomyocardial biopsy that was negative for cardiac amyloidosis. Suspect hypertrophic cardiomyopathy.  CVP 11-12. Brisk diuresis noted. CO-OX 33%. Repeat Now. If remains low will increase DBA - Continue lasix  120mg  IV BID and repeat metolazone 2.5 mg. Supp K   -GDMT limited with low output, CKD, and hypotension.  - Continue current midodrine 15 mg tid for now to maintain MAP.   2. AKI on CKD stage 3b: Renal US  negative. ?ATN in setting of hypotension from GI bleeding.   Creatinine  trending up 4.8>5 - nephrology now following, conducting w/u to exclude  nephrotic syndrome  - Renal US  normal - Multiple Myeloma panel 9/25 negative for M-spike protein   3. Atrial fibrillation: Paroxysmal.  -Rate controlled.  - Eliquis  restarted with resolution of overt GI bleeding.  - can plan outpatient DCCV after back on Eliquis  x 3 wks   4. GI bleeding: Has had transfusions.  EGD with gastritis.  Colonoscopy w/ 2 polyps (resected) and non bleeding hemorrhoids. - No bleeding.  - Transferrin saturation 4%, given IV Fe.  - Hgb stable 8.1  transfusion per primary team - Continue eliquis     5. ETOH abuse.  - he reports he quit September 16th. Consult TOC for alcohol cessation program.   CO-OX 33%, Repeat now. As above increase DBA if low.   CO-OX repeated 30%. Increase DBA 4 mcg.   ? If heart and kidney transplant is a possibilty. Dr Leblanc discussed with him.  Consult Palliative Care for GOC.   Length of Stay: 10  Keith Mosses, NP  04/27/2024, 6:47 AM  Patient seen and examined with the above-signed Advanced Practice Provider and/or Housestaff. I personally reviewed laboratory data, imaging studies and relevant notes. I independently examined the patient and formulated the important aspects of the plan. I have edited the note to reflect any of my changes or salient points. I have personally discussed the plan with the patient and/or family.  Remains on DBA 4. Initial co-ox this am was low but recheck ok. CVP 11-12. Feels ok. Concenred about next steps.   General:  Sitting up in bed. No resp difficulty HEENT: normal Neck: supple. JVP jaw  Cor: Regular rate & rhythm. No rubs, gallops or murmurs. Lungs: clear Abdomen: soft, nontender, nondistended.Good bowel sounds. Extremities: no cyanosis, clubbing, rash, edema Neuro: alert & orientedx3, cranial nerves grossly intact. moves all 4 extremities w/o difficulty. Affect pleasant  He remains inotrope dependent. Will start slow wean of DBA tomorrow after further diuresis.   Options moving forward are  very limited with advanced CKD.  We discussed possible referral for heart-kidney transplant. Major obstacle seems to be h/o ETOH abuse but now quite x 2 months. Willing to participate in AA.   No clinical signs of cirrhosis. Will get RUQ u/s to assess liver contour.   I will d/w [facility] team.   Keith Cherrie, MD  12:12 PM

## 2024-04-27 NOTE — Progress Notes (Signed)
 PROGRESS NOTE Keith Leblanc  FMW:[id number] DOB: 1966/06/22 DOA: 04/17/2024 PCP: [name], NP  Brief Narrative/Hospital Course: Keith Leblanc is a 58 y.o. male with PMH of of prior stroke, hypertension, diabetes who presented to the ED due to shortness of breath w/ progressive worsening over the last week and noticed bright red blood in stool , he in ED BP soft, labs showed bicarb 21, creatinine 3.1 baseline around 3, WBC 8.0, hemoglobin 7.5 baseline around 12, troponin 59, 54, INR 1.6.   CXR>>no acute changes.GI was consulted and started on IV PPI BID.  S/P 1 unit of packed RBCs for Hg 6.8. His Eliquis  was held. Last colonoscopy in 2019 which showed diverticulosis and multiple polyps.  Patient endorsed improving shortness of breath following transfusion no chest pain. Underwent EGD 10/27 has congested friable gastric mucosa likely the source of bleeding  10/30 colonoscopy showed hemorrhoids likely cause of patient's blood in the stool Patient having ongoing AKI nephro cardio following 10/31 underwent RHC, and had central line placement-patient was started on dobutamine drip 1. Elevated right-sided filling pressures out of proportion to mildly elevated PCWP.  Mild pulmonary venous hypertension Low cardiac output by thermo though preserved by Fick. Low PAPi>S uspect primarily RV dysfunction> started dobutamine 2.5 for RV support.  Subjective: Seen and examined Girlfriend at the bedside No chest pain nausea vomiting Bit upset after meeting with AHF team- was told he needs heart and kidney transplant. Overnight afebrile BP stable labs this morning creatinine at 4.9 potassium 3.2 hemoglobin 8.1 Weight on admission 232, > 237 improvng> 234>228>224 CO-OX : 33% on repeat 30%-dobutamine rate increased PRN correction at 63.3 %  Assessment and plan:  ABLA due to upper GI bleed with friable congested gastric mucosa Hematochezia due to internal hemorrhoids:  history of LA grade a reflux  esophagitis and gastritis seen on EGD in 2019. S/p EGD 10/27: congested friable gastric mucosa likely the source of bleeding.  Colonoscopy 10/30-internal hemorrhoids likely cause of hematochezia doubt it accounts for patient's drop in hemoglobin. So far received 3 units PRBC- hb holding ? 8 gm  Continue to monitor hemoglobin. Eliquis  resumed 11/1 after GI clearance, cont ppi   Acute on chronic CHF with midrange EF 43% RV dysfunction HCM/Hypertension Hypotension-persistent Nonsustained V. tach: Previous EF 45-50% in August/25 now at 43%, RV mildly reduced-had endomyocardial biopsy that was negative for cardiac amyloidosis, suspect hypertrophic cardiomyopathy. S/p RHC 10/31-with RV dysfunction (see report) s/ p central line and started dobutamine drip and continued high-dose Lasix   On midodrine 15 mg tid daily due to hypotension, GDMT limited due to hypotension and renal failure Dobutamine drip increased 11/4 to 4mcg as CO-OX 30% ,LAC 1.34, metolazone 2.5 x 1 again, on iv Lasix , replacing potassium Continue plan of care as per HF team and nephrology-AHF team has discussed this morning about heart and kidney transplant, major obstacle seems to be EtOH abuse but now quit x 2 months willing to participate in AA  Monitor daily I/O,weight, electrolytes and net balance as below.Keep on  salt/fluid restricted diet and monitor in tele. Net IO Since Admission: -3,454.89 mL [04/27/24 1101]  Filed Weights   04/25/24 0500 04/26/24 0700 04/27/24 0500  Weight: 106.5 kg 103.5 kg 101.7 kg    AKI on CKD stage V Metabolic acidosis Hypokalemia: Creat b/l 1.5-2.5.likely multifactorial AKI on CKD due to GI bleeding, severe anemia, hypotension,RV dysfunction and need for diuretics Nephro following closely, high risk for worsening of renal failure/dialysis Overall creatinine plateaued/slightly better?- continue Lasix /dobutamine as above, replace  electrolytes.  Recent Labs    04/19/24 0447 04/20/24 0435  04/21/24 0319 04/22/24 0404 04/23/24 0445 04/23/24 1058 04/24/24 0500 04/25/24 0547 04/26/24 0500 04/26/24 1650 04/27/24 0535  BUN 73* 76* 77* 71* 70*  --  67* 63* 57* 55* 55*  CREATININE 3.97* 4.00* 4.25* 4.49* 4.82*  --  5.08* 5.17* 4.82* 4.88* 4.97*  CO2 21* 19* 18* 20* 18*  --  20* 20* 25 25 26   K 4.3 3.7 3.8 4.0 3.6   < > 3.4* 3.3* 3.0* 3.1* 3.2*   < > = values in this interval not displayed.   Intake/Output Summary (Last 24 hours) at 04/27/2024 1101 Last data filed at 04/27/2024 0700 Gross per 24 hour  Intake 291.89 ml  Output 2900 ml  Net -2608.11 ml    Chronic depression: Mood stable, continue bupropion .  Nursing reports he is not sleeping as much-added trazodone prn.   Elevated troponin: Due to demand ischemia.  Plan per cardiology  Hyperlipidemia: continue  crestor    Persistent A-fib: Eliquis  resumed 11/1.Rate is stable monitor in telemetry  DVT prophylaxis: Place TED hose Start: 04/21/24 1237 SCDs Start: 04/17/24 2333 Code Status:   Code Status: Full Code Family Communication: plan of care discussed with patient  Patient status is: Remains hospitalized because of severity of illness Level of care: Progressive transfer to telemetry  Dispo: The patient is from: HOME alone, not married.            Anticipated disposition: TBD Objective: Vitals last 24 hrs: Vitals:   04/27/24 0500 04/27/24 0606 04/27/24 0810 04/27/24 0900  BP:  95/63    Pulse:  61    Resp:  (!) 21 20 18   Temp:  (P) 98.4 F (36.9 C)    TempSrc:  (P) Oral    SpO2:  100%    Weight: 101.7 kg     Height:       Physical Examination: General exam: AAOX3, pleasant, anxious and upset HEENT:Oral mucosa moist, Ear/Nose WNL grossly Respiratory system: B/L Clear,no use of accessory muscle Cardiovascular system: S1 & S2 +, No JVD. Gastrointestinal system: Abdomen soft,NT,ND, BS+ Nervous System: Alert, awake, moving all extremities Extremities: extremities warm, leg edema neg Skin: Warm, no  rashes MSK: Normal muscle bulk,tone, power   Medications reviewed:  Scheduled Meds:  apixaban   5 mg Oral BID   buPROPion   300 mg Oral Daily   Chlorhexidine  Gluconate Cloth  6 each Topical Daily   insulin  aspart  0-9 Units Subcutaneous TID WC   midodrine  15 mg Oral TID WC   pantoprazole   40 mg Oral BID   potassium chloride   40 mEq Oral BID   rosuvastatin   20 mg Oral Daily   simethicone   240 mg Oral Once   sodium chloride  flush  10-40 mL Intracatheter Q12H  Continuous Infusions:  DOBUTamine 4 mcg/kg/min (04/27/24 0800)   furosemide  120 mg (04/27/24 0908)   iron sucrose 200 mg (04/27/24 1016)  Diet: Diet Order             Diet Heart Room service appropriate? Yes; Fluid consistency: Thin  Diet effective now                    Data Reviewed: I have personally reviewed following labs and imaging studies ( see epic result tab) CBC: Recent Labs  Lab 04/23/24 0445 04/23/24 1058 04/23/24 1906 04/24/24 0500 04/25/24 0547 04/26/24 0500 04/27/24 0535  WBC 7.4  --   --  7.9 7.6 6.0 6.9  HGB 7.1*   < > 8.5* 8.3* 8.2* 7.8* 8.1*  HCT 22.9*   < > 26.6* 25.8* 26.0* 24.4* 25.7*  MCV 83.9  --   --  83.2 83.3 82.7 83.4  PLT 362  --   --  332 331 312 320   < > = values in this interval not displayed.   CMP: Recent Labs  Lab 04/23/24 0445 04/23/24 1058 04/24/24 0500 04/25/24 0547 04/25/24 0900 04/26/24 0500 04/26/24 1650 04/27/24 0535  NA 138   < > 140 135  --  139 138 137  K 3.6   < > 3.4* 3.3*  --  3.0* 3.1* 3.2*  CL 102  --  103 100  --  100 98 99  CO2 18*  --  20* 20*  --  25 25 26   GLUCOSE 133*  --  139* 147*  --  119* 101* 111*  BUN 70*  --  67* 63*  --  57* 55* 55*  CREATININE 4.82*  --  5.08* 5.17*  --  4.82* 4.88* 4.97*  CALCIUM  8.9  --  9.1 8.8*  --  9.1 9.2 8.9  MG 2.1  --  2.0  --  1.8 2.0  --   --    < > = values in this interval not displayed.   GFR: Estimated Creatinine Clearance: 20.9 mL/min (A) (by C-G formula based on SCr of 4.97 mg/dL (H)). Recent  Labs  Lab 04/23/24 0445  ALBUMIN 2.9*   No results for input(s): LIPASE, AMYLASE in the last 168 hours. No results for input(s): AMMONIA in the last 168 hours. Coagulation Profile:  No results for input(s): INR, PROTIME in the last 168 hours.  Unresulted Labs (From admission, onward)     Start     Ordered   04/27/24 0656  Lactic acid, plasma  (Lactic Acid)  STAT Now then every 3 hours,   R (with STAT occurrences)     Question:  Specimen collection method  Answer:  Unit=Unit collect   04/27/24 0655   04/27/24 0500  Basic metabolic panel with GFR  Daily,   R     Question:  Specimen collection method  Answer:  Unit=Unit collect   04/26/24 0830   04/27/24 0500  CBC  Daily,   R     Question:  Specimen collection method  Answer:  Unit=Unit collect   04/26/24 0830   04/23/24 1117  Cooxemetry Panel (carboxy, met, total hgb, O2 sat)  Daily,   R     Question:  Specimen collection method  Answer:  Lab=Lab collect   04/23/24 1116           Antimicrobials/Microbiology: Anti-infectives (From admission, onward)    None         Component Value Date/Time   SDES BLOOD LEFT ANTECUBITAL 07/07/2016 1225   SPECREQUEST BOTTLES DRAWN AEROBIC AND ANAEROBIC 5CC 07/07/2016 1225   CULT  07/07/2016 1225    NO GROWTH 5 DAYS Performed at [facility], [address], [place], [place] [address]    REPTSTATUS 07/12/2016 FINAL 07/07/2016 1225    Procedures: Procedure(s) (LRB): RIGHT HEART CATH (N/A)   Mennie LAMY, MD [facility] 04/27/2024, 1:16 PM

## 2024-04-27 NOTE — Progress Notes (Signed)
 [facility] night cross cover note:   I was notified by the patient's RN that the patient reports having a bowel movement this evening associated with some blood in it. RN conveys that the patient was reported to have experienced a similar episode of bloody stool during dayshift yesterday.  He is otherwise without reported acute complaint at this time.   Most recent vital signs appear stable, including afebrile, heart rates in the 60s; systolic blood pressures in the low 100s, respiratory rate 16-18, and oxygen saturation in the mid 90s to 100% on room air.  Per brief chart review, it appears that the patient is here with acute blood loss anemia in the setting of GI bleed.  Result of this morning CBC is currently pending.     Eva Pore, DO Hospitalist

## 2024-04-27 NOTE — TOC Progression Note (Signed)
 Transition of Care [facility]) - Progression Note    Patient Details  Name: Keith Leblanc MRN: [id number] Date of Birth: [birth year]  [facility] CM/SW Contact  Arlana JINNY Nicholaus [place] Phone Number: [phone] 04/27/2024, 2:47 PM  Clinical Narrative: HF CSW met with patient and his girlfriend, Keith Leblanc at bedside. CSW addressed the SA Consult. Patient stated that he has no SA concerns. Patient stated that the last time he smoked marijuana or had any alcohol was in September 2025. Patient stated at this time he does not need any SA resources. CSW placed SA on AVS.   HF CSW/CM will continue to follow and monitor for dc readiness.                      Expected Discharge Plan and Services                                               Social Drivers of Health (SDOH) Interventions SDOH Screenings   Food Insecurity: No Food Insecurity (04/18/2024)  Housing: Low Risk  (04/18/2024)  Transportation Needs: No Transportation Needs (04/18/2024)  Utilities: Not At Risk (04/18/2024)  Depression (PHQ2-9): Low Risk  (03/23/2024)  Tobacco Use: Medium Risk (04/23/2024)    Readmission Risk Interventions    03/15/2024   12:35 PM  Readmission Risk Prevention Plan  Transportation Screening Complete  HRI or Home Care Consult Complete  Social Work Consult for Recovery Care Planning/Counseling Complete  Palliative Care Screening Not Applicable  Medication Review Oceanographer) Referral to Pharmacy

## 2024-04-28 ENCOUNTER — Inpatient Hospital Stay (HOSPITAL_COMMUNITY)

## 2024-04-28 DIAGNOSIS — I5043 Acute on chronic combined systolic (congestive) and diastolic (congestive) heart failure: Secondary | ICD-10-CM | POA: Diagnosis not present

## 2024-04-28 LAB — GLUCOSE, CAPILLARY
Glucose-Capillary: 128 mg/dL — ABNORMAL HIGH (ref 70–99)
Glucose-Capillary: 138 mg/dL — ABNORMAL HIGH (ref 70–99)
Glucose-Capillary: 131 mg/dL — ABNORMAL HIGH (ref 70–99)
Glucose-Capillary: 167 mg/dL — ABNORMAL HIGH (ref 70–99)

## 2024-04-28 LAB — PREPARE RBC (CROSSMATCH)

## 2024-04-28 LAB — HEMOGLOBIN AND HEMATOCRIT, BLOOD
HCT: 28.4 % — ABNORMAL LOW (ref 39.0–52.0)
Hemoglobin: 8.8 g/dL — ABNORMAL LOW (ref 13.0–17.0)

## 2024-04-28 LAB — CBC
HCT: 24.7 % — ABNORMAL LOW (ref 39.0–52.0)
Hemoglobin: 7.9 g/dL — ABNORMAL LOW (ref 13.0–17.0)
MCH: 26.6 pg (ref 26.0–34.0)
MCHC: 32 g/dL (ref 30.0–36.0)
MCV: 83.2 fL (ref 80.0–100.0)
Platelets: 317 K/uL (ref 150–400)
RBC: 2.97 MIL/uL — ABNORMAL LOW (ref 4.22–5.81)
RDW: 16.2 % — ABNORMAL HIGH (ref 11.5–15.5)
WBC: 6.4 K/uL (ref 4.0–10.5)
nRBC: 0 % (ref 0.0–0.2)

## 2024-04-28 LAB — BASIC METABOLIC PANEL WITH GFR
Anion gap: 17 — ABNORMAL HIGH (ref 5–15)
BUN: 49 mg/dL — ABNORMAL HIGH (ref 6–20)
CO2: 25 mmol/L (ref 22–32)
Calcium: 9 mg/dL (ref 8.9–10.3)
Chloride: 95 mmol/L — ABNORMAL LOW (ref 98–111)
Creatinine, Ser: 4.72 mg/dL — ABNORMAL HIGH (ref 0.61–1.24)
GFR, Estimated: 14 mL/min — ABNORMAL LOW (ref >60)
Glucose, Bld: 133 mg/dL — ABNORMAL HIGH (ref 70–99)
Potassium: 3.3 mmol/L — ABNORMAL LOW (ref 3.5–5.1)
Sodium: 137 mmol/L (ref 135–145)

## 2024-04-28 LAB — COOXEMETRY PANEL
Carboxyhemoglobin: 1.5 % (ref 0.5–1.5)
Carboxyhemoglobin: 1 % (ref 0.5–1.5)
Methemoglobin: 0.8 % (ref 0.0–1.5)
Methemoglobin: 2.3 % — ABNORMAL HIGH (ref 0.0–1.5)
O2 Saturation: 63.1 %
O2 Saturation: 57.7 %
Total hemoglobin: 8.4 g/dL — ABNORMAL LOW (ref 12.0–16.0)
Total hemoglobin: 11.6 g/dL — ABNORMAL LOW (ref 12.0–16.0)

## 2024-04-28 MED ORDER — ALTEPLASE 2 MG IJ SOLR
2.0000 mg | Freq: Once | INTRAMUSCULAR | Status: AC
  Administered 2024-04-28: 2 mg
  Filled 2024-04-28: qty 2

## 2024-04-28 MED ORDER — POTASSIUM CHLORIDE CRYS ER 20 MEQ PO TBCR
40.0000 meq | EXTENDED_RELEASE_TABLET | Freq: Four times a day (QID) | ORAL | Status: DC
Start: 2024-04-28 — End: 2024-04-28

## 2024-04-28 MED ORDER — DARBEPOETIN ALFA 100 MCG/0.5ML IJ SOSY: 100.0000 ug | PREFILLED_SYRINGE | INTRAMUSCULAR | Status: DC

## 2024-04-28 MED ORDER — POTASSIUM CHLORIDE CRYS ER 20 MEQ PO TBCR
30.0000 meq | EXTENDED_RELEASE_TABLET | Freq: Four times a day (QID) | ORAL | Status: AC
  Administered 2024-04-28 (×2): 30 meq via ORAL
  Filled 2024-04-28 (×2): qty 1

## 2024-04-28 MED ORDER — SODIUM CHLORIDE 0.9% IV SOLUTION: Freq: Once | INTRAVENOUS | Status: DC

## 2024-04-28 MED ORDER — DARBEPOETIN ALFA 100 MCG/0.5ML IJ SOSY
100.0000 ug | PREFILLED_SYRINGE | INTRAMUSCULAR | Status: AC
  Administered 2024-04-28: 100 ug via SUBCUTANEOUS
  Filled 2024-04-28: qty 0.5

## 2024-04-28 NOTE — Progress Notes (Signed)
 Arrived to room for central line assessment. Pt is off the floor. RN to re-enter consult when pt returns.

## 2024-04-28 NOTE — Progress Notes (Signed)
 PROGRESS NOTE Keith Leblanc  FMW:[id number] DOB: 06/17/1966 DOA: 04/17/2024 PCP: [name], NP  Brief Narrative/Hospital Course: Keith Leblanc is a 58 y.o. male with PMH of of prior stroke, hypertension, diabetes who presented to the ED due to shortness of breath w/ progressive worsening over the last week and noticed bright red blood in stool , he in ED BP soft, labs showed bicarb 21, creatinine 3.1 baseline around 3, WBC 8.0, hemoglobin 7.5 baseline around 12, troponin 59, 54, INR 1.6.   CXR>>no acute changes.GI was consulted and started on IV PPI BID.  S/P 1 unit of packed RBCs for Hg 6.8. His Eliquis  was held. Last colonoscopy in 2019 which showed diverticulosis and multiple polyps.  Patient endorsed improving shortness of breath following transfusion no chest pain. Underwent EGD 10/27 has congested friable gastric mucosa likely the source of bleeding  10/30 colonoscopy showed hemorrhoids likely cause of patient's blood in the stool Patient having ongoing AKI nephro cardio following 10/31 underwent RHC, and had central line placement-patient was started on dobutamine drip 1. Elevated right-sided filling pressures out of proportion to mildly elevated PCWP.  Mild pulmonary venous hypertension Low cardiac output by thermo though preserved by Fick. Low PAPi>S uspect primarily RV dysfunction> started dobutamine 2.5 for RV support.  Subjective: Seen and examined He feels hungry he came back from a right upper quadrant ultrasound, earlier had dyspnea on exertion but overall feels much improved Sister at the bedside This am Co-ox 63% Weight on admission 232, > 237 improvng> 234>228>224>221lb improving   Assessment and plan:  Acute on chronic CHF with midrange EF 43% RV dysfunction HCM/Hypertension Hypotension-persistent Nonsustained V. tach: Previous EF 45-50% in August/25 now at 43%, RV mildly reduced-had endomyocardial biopsy that was negative for cardiac amyloidosis, suspect  hypertrophic cardiomyopathy. S/p RHC 10/31-with RV dysfunction (see report) s/ p central line and started dobutamine drip and continued high-dose Lasix   On midodrine 15 mg tid daily due to hypotension, GDMT limited due to hypotension and renal failure Dobutamine drip increased 11/4 to 4mcg as CO-OX 30% ,LAC 1.34, metolazone 2.5 x 1 again, on iv Lasix , replacing potassium Continue plan of care as per HF team and nephrology-AHF team has discussed this morning about heart and kidney transplant, major obstacle seems to be EtOH abuse but now quit x 2 months willing to participate in AA .  Follow-up RUQ ultrasound follow-up further recommendation from AHF team Monitor daily I/O,weight, electrolytes and net balance as below.Keep on  salt/fluid restricted diet and monitor in tele. Net IO Since Admission: -5,813.24 mL [04/28/24 1058]  Filed Weights   04/26/24 0700 04/27/24 0500 04/28/24 0448  Weight: 103.5 kg 101.7 kg 100.4 kg    AKI on CKD stage V Metabolic acidosis Hypokalemia: Creat b/l 1.5-2.5.likely multifactorial AKI on CKD due to GI bleeding, severe anemia, hypotension,RV dysfunction/CHF and need for diuretics Nephro following closely-, high risk for worsening of renal failure/dialysis Overall creatinine slightly better >Continue Lasix /dobutamine per AHF-  Recent Labs    04/20/24 0435 04/21/24 0319 04/22/24 0404 04/23/24 0445 04/23/24 1058 04/24/24 0500 04/25/24 0547 04/26/24 0500 04/26/24 1650 04/27/24 0535 04/28/24 0628  BUN 76* 77* 71* 70*  --  67* 63* 57* 55* 55* 49*  CREATININE 4.00* 4.25* 4.49* 4.82*  --  5.08* 5.17* 4.82* 4.88* 4.97* 4.72*  CO2 19* 18* 20* 18*  --  20* 20* 25 25 26 25   K 3.7 3.8 4.0 3.6   < > 3.4* 3.3* 3.0* 3.1* 3.2* 3.3*   < > = values  in this interval not displayed.   Intake/Output Summary (Last 24 hours) at 04/28/2024 1058 Last data filed at 04/28/2024 0700 Gross per 24 hour  Intake 771.65 ml  Output 3130 ml  Net -2358.35 ml   ABLA due to upper GI  bleed with friable congested gastric mucosa Hematochezia due to internal hemorrhoids:  history of LA grade a reflux esophagitis and gastritis seen on EGD in 2019. S/p EGD 10/27: congested friable gastric mucosa likely the source of bleeding.  Colonoscopy 10/30-internal hemorrhoids likely cause of hematochezia doubt it accounts for patient's drop in hemoglobin. So far received 3 units PRBC- hb holding ? 8 gm  Continue to monitor hemoglobin. Eliquis  resumed 11/1 after GI clearance, cont ppi  Recent Labs  Lab 04/24/24 0500 04/25/24 0547 04/26/24 0500 04/27/24 0535 04/28/24 0628  HGB 8.3* 8.2* 7.8* 8.1* 7.9*  HCT 25.8* 26.0* 24.4* 25.7* 24.7*     Chronic depression: Mood stable, continue bupropion .  Nursing reports he is not sleeping as much-added trazodone prn.   Elevated troponin: Due to demand ischemia.  Plan per cardiology  Hyperlipidemia: continue  crestor    Persistent A-fib: Eliquis  resumed 11/1.Rate is stable monitor in telemetry  DVT prophylaxis: Place TED hose Start: 04/21/24 1237 SCDs Start: 04/17/24 2333 Code Status:   Code Status: Full Code Family Communication: plan of care discussed with patient  Patient status is: Remains hospitalized because of severity of illness Level of care: Progressive transfer to telemetry  Dispo: The patient is from: HOME alone, not married.            Anticipated disposition: TBD Objective: Vitals last 24 hrs: Vitals:   04/28/24 0030 04/28/24 0445 04/28/24 0448 04/28/24 0842  BP: 94/69 94/70  90/70  Pulse: 64 79  70  Resp: 19 20  16   Temp: 97.9 F (36.6 C) 97.8 F (36.6 C)  98 F (36.7 C)  TempSrc: Oral Oral  Oral  SpO2: 96% 99%  98%  Weight:   100.4 kg   Height:       Physical Examination: General exam: AAOX3, pleasant, anxious and upset HEENT:Oral mucosa moist, Ear/Nose WNL grossly Respiratory system: B/L Clear,no use of accessory muscle Cardiovascular system: S1 & S2 +, No JVD. Gastrointestinal system: Abdomen  soft,NT,ND, BS+ Nervous System: Alert, awake, moving all extremities Extremities: extremities warm, leg edema neg Skin: Warm, no rashes MSK: Normal muscle bulk,tone, power   Medications reviewed:  Scheduled Meds:  sodium chloride    Intravenous Once   apixaban   5 mg Oral BID   buPROPion   300 mg Oral Daily   Chlorhexidine  Gluconate Cloth  6 each Topical Daily   insulin  aspart  0-9 Units Subcutaneous TID WC   midodrine  15 mg Oral TID WC   pantoprazole   40 mg Oral BID   rosuvastatin   20 mg Oral Daily   simethicone   240 mg Oral Once   sodium chloride  flush  10-40 mL Intracatheter Q12H  Continuous Infusions:  DOBUTamine 4 mcg/kg/min (04/28/24 0210)   furosemide  120 mg (04/28/24 0857)   iron sucrose 200 mg (04/27/24 1016)  Diet: Diet Order             Diet Heart Room service appropriate? Yes; Fluid consistency: Thin  Diet effective now                    Data Reviewed: I have personally reviewed following labs and imaging studies ( see epic result tab) CBC: Recent Labs  Lab 04/24/24 0500 04/25/24 0547 04/26/24 0500  04/27/24 0535 04/28/24 0628  WBC 7.9 7.6 6.0 6.9 6.4  HGB 8.3* 8.2* 7.8* 8.1* 7.9*  HCT 25.8* 26.0* 24.4* 25.7* 24.7*  MCV 83.2 83.3 82.7 83.4 83.2  PLT 332 331 312 320 317   CMP: Recent Labs  Lab 04/23/24 0445 04/23/24 1058 04/24/24 0500 04/25/24 0547 04/25/24 0900 04/26/24 0500 04/26/24 1650 04/27/24 0535 04/28/24 0628  NA 138   < > 140 135  --  139 138 137 137  K 3.6   < > 3.4* 3.3*  --  3.0* 3.1* 3.2* 3.3*  CL 102  --  103 100  --  100 98 99 95*  CO2 18*  --  20* 20*  --  25 25 26 25   GLUCOSE 133*  --  139* 147*  --  119* 101* 111* 133*  BUN 70*  --  67* 63*  --  57* 55* 55* 49*  CREATININE 4.82*  --  5.08* 5.17*  --  4.82* 4.88* 4.97* 4.72*  CALCIUM  8.9  --  9.1 8.8*  --  9.1 9.2 8.9 9.0  MG 2.1  --  2.0  --  1.8 2.0  --   --   --    < > = values in this interval not displayed.   GFR: Estimated Creatinine Clearance: 22 mL/min (A) (by  C-G formula based on SCr of 4.72 mg/dL (H)). Recent Labs  Lab 04/23/24 0445  ALBUMIN 2.9*   No results for input(s): LIPASE, AMYLASE in the last 168 hours. No results for input(s): AMMONIA in the last 168 hours. Coagulation Profile:  No results for input(s): INR, PROTIME in the last 168 hours.  Unresulted Labs (From admission, onward)     Start     Ordered   04/28/24 1200  Cooxemetry Panel (carboxy, met, total hgb, O2 sat)  Once,   R       Question:  Specimen collection method  Answer:  Unit=Unit collect   04/28/24 1005   04/28/24 0911  Type and screen [facility]  Once,   R       Comments: [facility]    04/28/24 0910   04/28/24 0850  Prepare RBC (crossmatch)  (Blood Administration Adult)  Once,   R       Question Answer Comment  # of Units 1 unit   Transfusion Indications Hemoglobin 8 gm/dL or less and orthopedic or cardiac surgery or pre-existing cardiac condition   Number of Units to Keep Ahead NO units ahead     Placed in And Linked Group   04/28/24 0849   04/27/24 0500  Basic metabolic panel with GFR  Daily,   R     Question:  Specimen collection method  Answer:  Unit=Unit collect   04/26/24 0830   04/27/24 0500  CBC  Daily,   R     Question:  Specimen collection method  Answer:  Unit=Unit collect   04/26/24 0830   04/23/24 1117  Cooxemetry Panel (carboxy, met, total hgb, O2 sat)  Daily,   R     Question:  Specimen collection method  Answer:  Lab=Lab collect   04/23/24 1116           Antimicrobials/Microbiology: Anti-infectives (From admission, onward)    None         Component Value Date/Time   SDES BLOOD LEFT ANTECUBITAL 07/07/2016 1225   SPECREQUEST BOTTLES DRAWN AEROBIC AND ANAEROBIC 5CC 07/07/2016 1225   CULT  07/07/2016 1225  NO GROWTH 5 DAYS Performed at [facility], [address] [place], [place] [address]    REPTSTATUS 07/12/2016 FINAL 07/07/2016 1225    Procedures: Procedure(s)  (LRB): RIGHT HEART CATH (N/A)   Mennie LAMY, MD [facility] 04/28/2024, 10:59 AM

## 2024-04-28 NOTE — Progress Notes (Addendum)
 Patient ID: Keith Leblanc, male   DOB: 05/09/1966, 58 y.o.   MRN: [id number]     Advanced Heart Failure Rounding Note  Cardiologist: None  Chief Complaint: A/c HFmrEF, GIB  Patient Profile   58 y/o male w/ prior h/o heavy ETOH use (recently quit), HFmrEF, CKDIIIa, PAF on Eliquis , HTN, HLD and Type 2DM, admitted w/ symptomatic ABLA 2/2 GIB. HF management c/b persistent hypotension and AKI despite adequate volume resuscitation. AHF team asked to assist.   Subjective:    Echo this admit EF 40-45% (previously 45-50%), severe LVH, RV mod reduced, RVSP 42, dilated IVC estimated RAP ~15 mmHg   Co-ox pending. CVP 9. On DBA 4 Net negative 2L. Weight down 3 lbs. sCr 4.88>4.97>4.72  Sitting up in bed. In good spirits this morning. Had episode SOB x1 while up with morning.   Objective:    Weight Range: 100.4 kg Body mass index is 26.25 kg/m.   Vital Signs:   Temp:  [97.8 F (36.6 C)-98.1 F (36.7 C)] 97.8 F (36.6 C) (11/05 0445) Pulse Rate:  [33-79] 79 (11/05 0445) Resp:  [18-20] 20 (11/05 0445) BP: (90-95)/(55-70) 94/70 (11/05 0445) SpO2:  [96 %-99 %] 99 % (11/05 0445) Weight:  [100.4 kg] 100.4 kg (11/05 0448) Last BM Date : 04/27/24  Weight change: Filed Weights   04/26/24 0700 04/27/24 0500 04/28/24 0448  Weight: 103.5 kg 101.7 kg 100.4 kg   Intake/Output:  Intake/Output Summary (Last 24 hours) at 04/28/2024 0659 Last data filed at 04/28/2024 0448 Gross per 24 hour  Intake 771.65 ml  Output 3130 ml  Net -2358.35 ml    Physical Exam   General: Well appearing. No distress on RA Cardiac: JVP ~8cm. S1 and S2 present. S3 present. No murmurs  Abdomen: Soft, non-tender, non-distended.  Extremities: Warm and dry.  1+ BLE edema.  Neuro: Alert and oriented x3. Affect pleasant. Moves all extremities without difficulty.  EKG   AF 70s (personally reviewed)  Labs    CBC Recent Labs    04/26/24 0500 04/27/24 0535  WBC 6.0 6.9  HGB 7.8* 8.1*  HCT 24.4* 25.7*  MCV 82.7  83.4  PLT 312 320   Basic Metabolic Panel Recent Labs    88/97/74 0900 04/26/24 0500 04/26/24 0500 04/26/24 1650 04/27/24 0535  NA  --  139   < > 138 137  K  --  3.0*   < > 3.1* 3.2*  CL  --  100   < > 98 99  CO2  --  25   < > 25 26  GLUCOSE  --  119*   < > 101* 111*  BUN  --  57*   < > 55* 55*  CREATININE  --  4.82*   < > 4.88* 4.97*  CALCIUM   --  9.1   < > 9.2 8.9  MG 1.8 2.0  --   --   --    < > = values in this interval not displayed.   BNP (last 3 results) Recent Labs    03/24/24 1624 04/17/24 1704  BNP 1,093.8* 1,324.0*   ProBNP (last 3 results) Recent Labs    03/12/24 0942  PROBNP 5,364.0*   Medications:    Scheduled Medications:  apixaban   5 mg Oral BID   buPROPion   300 mg Oral Daily   Chlorhexidine  Gluconate Cloth  6 each Topical Daily   insulin  aspart  0-9 Units Subcutaneous TID WC   midodrine  15 mg Oral TID WC  pantoprazole   40 mg Oral BID   rosuvastatin   20 mg Oral Daily   simethicone   240 mg Oral Once   sodium chloride  flush  10-40 mL Intracatheter Q12H    Infusions:  DOBUTamine 4 mcg/kg/min (04/28/24 0210)   furosemide  120 mg (04/27/24 1734)   iron sucrose 200 mg (04/27/24 1016)    PRN Medications: acetaminophen  **OR** acetaminophen , alum & mag hydroxide-simeth, ondansetron  **OR** ondansetron  (ZOFRAN ) IV, sodium chloride  flush, traMADol, traZODone  Assessment/Plan   1. Acute on chronic HF with mid range EF: Echo in 8/25 showed EF 45-50%, severe LVH, abnormal RV free wall strain, ~17%, RV mildly reduced.  Cardiac MRI showed moderate concentric LVH with EF 43%, RV EF 47%, ECV 36%, diffuse patchy LGE throughout LV myocardium. PYP scan negative.  He had an endomyocardial biopsy that was negative for cardiac amyloidosis. Suspect hypertrophic cardiomyopathy.  - DBA increased to 4 with persistent low output, co-ox 33>30. Co-ox 63 today - CVP 9, continue IV Lasix  120 mg IV bid one more day  - GDMT limited with low output, CKD, and hypotension.   - Continue current midodrine 15 mg tid for now to maintain MAP.  - remains inotrope dependent, attempting slow wean of DBA. Dr. Cherrie discussing with [facility] transplant about referral for heart-kidney.   2. AKI on CKD stage 3b: Renal US  negative. ?ATN in setting of hypotension from GI bleeding.   Creatinine  trending up 4.8>5 - nephrology now following, conducting w/u to exclude nephrotic syndrome  - Renal US  normal - Multiple Myeloma panel 9/25 negative for M-spike protein   3. Atrial fibrillation: Persistent  - Rate controlled.  - Eliquis  restarted with resolution of overt GI bleeding.  - can plan outpatient DCCV after back on Eliquis  x 3 wks   4. GI bleeding: Has had transfusions.  EGD with gastritis.  Colonoscopy w/ 2 polyps (resected) and non bleeding hemorrhoids. - No bleeding.  - Transferrin saturation 4%, given IV Fe.  - Hgb stable 7.9  Give 1u RBCs - Continue eliquis     5. ETOH abuse.  - he reports he quit September 16th. Consult TOC for alcohol cessation program.  - check RUQ US   Length of Stay: 11  Jordan Lee, NP  04/28/2024, 6:59 AM   Patient seen and examined with the above-signed Advanced Practice Provider and/or Housestaff. I personally reviewed laboratory data, imaging studies and relevant notes. I independently examined the patient and formulated the important aspects of the plan. I have edited the note to reflect any of my changes or salient points. I have personally discussed the plan with the patient and/or family.  Remains on DB4 co-ox ok. Still volume overloaded.   Scr slightly better.   General:  Sitting up in bed. No resp difficulty HEENT: normal Neck: supple. JVP to jaw Cor: Irregular rate & rhythm. No rubs, gallops or murmurs. Lungs: clear Abdomen: soft, nontender, nondistended.Good bowel sounds. Extremities: no cyanosis, clubbing, rash, tr edema Neuro: alert & orientedx3, cranial nerves grossly intact. moves all 4 extremities w/o difficulty.  Affect pleasant  Will diurese one more day. Once euvolemic will wean DBA. May be worth attempting to restore NSR while in house to facilitate DBA wean.   Discussing with [facility] regarding possibility of heart-kidney transplant in setting of previous ETOH use.   Toribio Cherrie, MD  9:36 AM

## 2024-04-28 NOTE — Consult Note (Signed)
 Consultation Note Date: 04/28/2024   Patient Name: Keith Leblanc  DOB: August 27, 1965  MRN: [id number]  Age / Sex: 58 y.o., male   PCP: Keith Huxley, NP Referring Physician: Christobal Guadalajara, MD  Reason for Consultation: Establishing goals of care     Chief Complaint/History of Present Illness:   Keith Leblanc is a 58 year old male with prior history of heavy alcohol use (quit 2 months ago), heart failure with moderately reduced ejection fraction, CKD 3 AA, PAF on Eliquis , hypertension, hyperlipidemia, type 2 diabetes admitted with symptomatic close acute blood loss anemia secondary to GI bleed.  Course was complicated by volume overload with persistent hypotension and AKI and heart failure team was asked to see.  EGD revealed friable gastric mucosa that was the likely source of bleeding and hemoglobin currently holding.  For heart failure, he has been on midodrine 3 times daily as well as dobutamine drip, IV Lasix , metolazone.  He has low serum albumin and acute kidney injury that is complicating his overall clinical picture.  Advanced heart failure team is reaching out to [facility] to discuss if he would be potential candidate for heart and kidney transplant.  Palliative consulted for goals of care.  EMR reviewed including personal review of notes from nephrology, hospitalist, advanced heart failure team and bedside care staff.  Labs today with hemoglobin 8.7, potassium 3.2, creatinine 4.97 (up from 4.88 yesterday).  I presented to the bedside and met with patient and his girlfriend Keith Leblanc.  He was sitting in chair in the room and was very pleasant throughout the encounter.  He reports that there has been a lot going on and he is still trying to wrap his head around everything.  We discussed the difficulty of processing all the changes he has seen over the last week in the hospital.  We also discussed the seriousness of his condition with multiorgan involvement.  He tells me that he understands that  the heart failure team is going to reach out to discuss potential transplant options with [facility].  He feels that he needs to know what his options are prior to having further discussions about his long-term hopes and goals.  We did have a discussion today about advance care planning with a specific focus on the importance of determining a surrogate decision maker.  Reviewed decision making in   in the event one does not have healthcare power of attorney documentation completed.  He listened to the information provided and thanked me for discussing with him.  Overall, he was welcoming and pleasant throughout the encounter.  He was, however, quiet at times and seems to be distracted as he is processing everything that has been going on.  We discussed plan to give him some time to process everything, see if there are options for potential transplant, and reassess his situation in the next couple of days.  I am planning to follow-up tomorrow to continue conversation.  Primary Diagnoses  Present on Admission:  GI bleed  Melena  Acute renal failure superimposed on stage 4 chronic kidney disease (HCC)  AKI (acute kidney injury)   Palliative Review of Systems: Denies symptoms other than feeling tired  Past Medical History:  Diagnosis Date   Blood in stool    bright red blood    Chicken pox    Diabetes mellitus (HCC)    Erectile dysfunction    Gout    Hypertension    Stroke [facility])    Social History   Socioeconomic History   Marital  status: Single    Spouse name: Not on file   Number of children: Not on file   Years of education: Not on file   Highest education level: Not on file  Occupational History   Not on file  Tobacco Use   Smoking status: Former    Current packs/day: 0.00    Types: Cigarettes, Cigars    Quit date: 03/10/2024    Years since quitting: 0.1   Smokeless tobacco: Never  Vaping Use   Vaping status: Never Used  Substance and Sexual Activity   Alcohol  use: Not Currently   Drug use: Not Currently    Types: Marijuana   Sexual activity: Yes  Other Topics Concern   Not on file  Social History Narrative   Works for [employer]    Not married    One daughter who does not live with him    Likes to gamble, walk in the parks, travel.    Social Drivers of Corporate Investment Banker Strain: Not on file  Food Insecurity: No Food Insecurity (04/18/2024)   Hunger Vital Sign    Worried About Running Out of Food in the Last Year: Never true    Ran Out of Food in the Last Year: Never true  Transportation Needs: No Transportation Needs (04/18/2024)   PRAPARE - Administrator, Civil Service (Medical): No    Lack of Transportation (Non-Medical): No  Physical Activity: Not on file  Stress: Not on file  Social Connections: Not on file   Family History  Problem Relation Age of Onset   Alcohol abuse Father    Hypertension Father    Heart disease Father    Gout Father    Breast cancer Mother    Hypertension Mother    Gout Mother    Healthy Sister    Healthy Daughter    Colon cancer Neg Hx    Esophageal cancer Neg Hx    Rectal cancer Neg Hx    Stomach cancer Neg Hx    Scheduled Meds:  sodium chloride    Intravenous Once   apixaban   5 mg Oral BID   buPROPion   300 mg Oral Daily   Chlorhexidine  Gluconate Cloth  6 each Topical Daily   insulin  aspart  0-9 Units Subcutaneous TID WC   midodrine  15 mg Oral TID WC   pantoprazole   40 mg Oral BID   rosuvastatin   20 mg Oral Daily   simethicone   240 mg Oral Once   sodium chloride  flush  10-40 mL Intracatheter Q12H   Continuous Infusions:  DOBUTamine 4 mcg/kg/min (04/28/24 0210)   furosemide  120 mg (04/28/24 0857)   iron sucrose 200 mg (04/27/24 1016)   PRN Meds:.acetaminophen  **OR** acetaminophen , alum & mag hydroxide-simeth, ondansetron  **OR** ondansetron  (ZOFRAN ) IV, sodium chloride  flush, traMADol, traZODone No Known Allergies CBC:    Component Value Date/Time   WBC 6.4  04/28/2024 0628   HGB 7.9 (L) 04/28/2024 0628   HGB 13.0 04/01/2023 0851   HCT 24.7 (L) 04/28/2024 0628   HCT 40.4 04/01/2023 0851   PLT 317 04/28/2024 0628   PLT 363 04/01/2023 0851   MCV 83.2 04/28/2024 0628   MCV 102 (H) 04/01/2023 0851   NEUTROABS 2.6 11/13/2022 1100   NEUTROABS 2.5 07/13/2020 1449   LYMPHSABS 2.1 11/13/2022 1100   LYMPHSABS 2.7 07/13/2020 1449   MONOABS 0.5 11/13/2022 1100   EOSABS 0.1 11/13/2022 1100   EOSABS 0.1 07/13/2020 1449   BASOSABS 0.1 11/13/2022 1100  BASOSABS 0.1 07/13/2020 1449   Comprehensive Metabolic Panel:    Component Value Date/Time   NA 137 04/28/2024 0628   NA 142 12/18/2023 0926   K 3.3 (L) 04/28/2024 0628   CL 95 (L) 04/28/2024 0628   CO2 25 04/28/2024 0628   BUN 49 (H) 04/28/2024 0628   BUN 18 12/18/2023 0926   CREATININE 4.72 (H) 04/28/2024 0628   CREATININE 1.56 (H) 10/23/2023 1005   GLUCOSE 133 (H) 04/28/2024 0628   CALCIUM  9.0 04/28/2024 0628   AST 73 (H) 04/19/2024 0447   ALT 102 (H) 04/19/2024 0447   ALKPHOS 92 04/19/2024 0447   BILITOT 1.0 04/19/2024 0447   BILITOT 0.5 07/13/2020 1449   PROT 5.3 (L) 04/19/2024 0447   PROT 7.7 07/13/2020 1449   ALBUMIN 2.9 (L) 04/23/2024 0445   ALBUMIN 4.6 07/13/2020 1449    Physical Exam: Vital Signs: BP 90/70 (BP Location: Right Arm)   Pulse 70   Temp 98 F (36.7 C) (Oral)   Resp 16   Ht 6' 5 (1.956 m)   Wt 100.4 kg   SpO2 98%   BMI 26.25 kg/m  SpO2: SpO2: 98 % O2 Device: O2 Device: Room Air O2 Flow Rate:   Intake/output summary:  Intake/Output Summary (Last 24 hours) at 04/28/2024 9057 Last data filed at 04/28/2024 0700 Gross per 24 hour  Intake 771.65 ml  Output 3130 ml  Net -2358.35 ml   LBM: Last BM Date : 04/27/24 Baseline Weight: Weight: 105.2 kg Most recent weight: Weight: 100.4 kg  General: NAD, alert Eyes: conjunctiva clear, anicteric sclera HENT: normocephalic, atraumatic, moist mucous membranes Cardiovascular: RRR, 1+ edema in LE b/l Respiratory:  no increased work of breathing noted, not in respiratory distress Abdomen: not distended Skin: no rashes or lesions on visible skin Neuro: A&Ox4, following commands easily Psych: appropriately answers all questions, very pleasant           Additional Data Reviewed: Recent Labs    04/27/24 0535 04/28/24 0628  WBC 6.9 6.4  HGB 8.1* 7.9*  PLT 320 317  NA 137 137  BUN 55* 49*  CREATININE 4.97* 4.72*    Imaging: CARDIAC CATHETERIZATION 1. Elevated right-sided filling pressures out of proportion to mildly  elevated PCWP.  2. Mild pulmonary venous hypertension 3. Low cardiac output by thermo though preserved by Fick.  4. Low PAPi  Suspect primarily RV dysfunction.  Will start dobutamine 2.5 for RV  support.    I personally reviewed recent imaging.   Palliative Care Assessment and Plan Summary of Established Goals of Care and Medical Treatment Preferences    # Complex medical decision making/goals of care  - Mr. Coomes is still processing all the information regarding the acuity of his medical condition.  He remains full code/full scope treatment and is hopeful to find out more information about potential option for transplant or other interventions.  He remains hopeful that he will have some recovery of kidney function with current interventions.  Discussed surrogate decision making and he will consider.  Plan to continue to follow along as the options for care are delineated to progress conversation on long-term goals of care.  -  Code Status: Full Code  Prognosis: Unable to determine  # Symptom management Patient is receiving these palliative interventions for symptom management with an intent to improve quality of life.   - Pain: Tramadol as needed.  1 dose noted in last 3 days.  Tylenol  also available as needed  - Insomnia: Trazodone ordered  today  - Nausea: Not currently an issue.  Zofran  as needed  # Psycho-social/Spiritual Support:  - Support System: Sister,  girlfriend Keith - Desire for further Chaplain support: Not discussed today  # Discharge Planning:  To Be Determined  Thank you for allowing the palliative care team to participate in the care Toribio Server.  Amaryllis Meissner, MD Palliative Care Provider PMT # [phone]  If patient remains symptomatic despite maximum doses, please call PMT at [phone] between 0700 and 1900. Outside of these hours, please call attending, as PMT does not have night coverage.  I personally spent a total of 85 minutes in the care of the patient today including preparing to see the patient, getting/reviewing separately obtained history, performing a medically appropriate exam/evaluation, referring and communicating with other health care professionals, documenting clinical information in the EHR, and independently interpreting results.

## 2024-04-28 NOTE — Plan of Care (Signed)

## 2024-04-28 NOTE — Progress Notes (Signed)
 Keith Leblanc is an 59 y.o. male hypertension, dyslipidemia, T2DM, afib on Eliquis , CHF with grade third diastolic dysfunction/concentric LVH who was presented with symptomatic anemia, acute blood loss anemia seen as a consultation for the evaluation of AKI on CKD. Worsening SOB, DOE, BRBPR. Hypotensive in ED. EGD showed congested friable gastric mucosa likely the source of bleeding and colonoscopy on 10/30 showed hemorrhoids.  He was also managed for acute on chronic CHF exacerbation, EF was around 45% however grade third diastolic dysfunction.  Seen by cardiology team and placed on IV diuretics.    For CKD, patient was seen once at [facility] 2023; recent serum creatinine level fluctuating anywhere around 1.4-2.5. He has hypokalemia, noted albumin level of 2.4. No recent NSAID use and no IV contrast use. Jardiance  which is currently on hold.  He takes furosemide  40 mg at home.  Assessment/Plan: # Acute kidney injury on CKDIIIb-IV ( b/lcr 1.5-2.5): Acute kidney injury likely due to reduced renal perfusion (low effective circulatory volume) caused by persistent hypotension, severe anemia, ongoing use of diuretics for CHF.  Not on ACE inhibitor, ARB and no recent use of IV contrast. Appears to be CRS; hopefully did not develop ATN from hyperperfusion and hypotension.  -Currently on furosemide  120 mg twice daily with dobutamine started 10/31 evening.   He does have peripheral edema with low serum albumin level. Renal function somewhat better but requiring inotropic support. Plan to titrate off if tolerated o/w home dobutamine.   Dr. Cherrie d/w [facility] transplant team, performing prelim tests. Patient will be excited; he was very worried about future plans.  - Continue with strict ins and out, daily lab.  -Kidney ultrasound neg for r/o obstruction (he does c/o feeling full after voiding); will check a PVR bladder scan as well - Continue to avoid ACE inhibitor, ARB or SGLT 2 inhibitor.   # Acute blood loss  anemia due to GI bleed, symptomatic.  Seen by gastroenterologist and underwent upper and lower GI endoscopy.  Received multiple units of blood transfusion with improvement of hemoglobin.  Back on anticoagulation now. S/p resection of polyps + hemorrhoids but were not actively bldg - Severely iron deficient -> will load w IV iron and then after a few doses ESA -> darbo 100 requested for today   # Persistent hypotension:   Not on any antihypertensives.  Agree with midodrine and rx with albumin.   # Acute on chronic CHF with EF of 45 to 50%, diastolic dysfunction: Right heart cath 10/31 showed high right sided pressures now on dobutamine resulting in much improved UOP   # Metabolic acidosis: CO2 20, trend lab.  Subjective: Denies f/c/n/v/sob; he feels the lower ext edema is much better than previously. He has a better outlook today especially as his renal function is improving.  was very down yest with no improvement in renal function but better spirits today.     Chemistry and CBC: Creat  Date/Time Value Ref Range Status  10/23/2023 10:05 AM 1.56 (H) 0.70 - 1.30 mg/dL Final  91/95/7978 90:93 AM 1.37 (H) 0.70 - 1.33 mg/dL Final    Comment:    For patients >81 years of age, the reference limit for Creatinine is approximately 13% higher for people identified as African-American. .    Creatinine, Ser  Date/Time Value Ref Range Status  04/28/2024 06:28 AM 4.72 (H) 0.61 - 1.24 mg/dL Final  88/95/7974 94:64 AM 4.97 (H) 0.61 - 1.24 mg/dL Final  88/96/7974 95:49 PM 4.88 (H) 0.61 - 1.24 mg/dL Final  04/26/2024 05:00 AM 4.82 (H) 0.61 - 1.24 mg/dL Final  88/97/7974 94:52 AM 5.17 (H) 0.61 - 1.24 mg/dL Final  88/98/7974 94:99 AM 5.08 (H) 0.61 - 1.24 mg/dL Final  89/68/7974 95:54 AM 4.82 (H) 0.61 - 1.24 mg/dL Final  89/69/7974 95:95 AM 4.49 (H) 0.61 - 1.24 mg/dL Final  89/70/7974 96:80 AM 4.25 (H) 0.61 - 1.24 mg/dL Final  89/71/7974 95:64 AM 4.00 (H) 0.61 - 1.24 mg/dL Final  89/72/7974 95:52 AM  3.97 (H) 0.61 - 1.24 mg/dL Final  89/74/7974 94:95 PM 3.13 (H) 0.61 - 1.24 mg/dL Final  89/98/7974 95:75 PM 2.47 (H) 0.61 - 1.24 mg/dL Final  90/69/7974 88:61 AM 2.42 (H) 0.40 - 1.50 mg/dL Final  90/74/7974 96:42 AM 2.88 (H) 0.61 - 1.24 mg/dL Final  90/75/7974 94:43 AM 3.05 (H) 0.61 - 1.24 mg/dL Final  90/76/7974 95:63 AM 2.79 (H) 0.61 - 1.24 mg/dL Final  90/77/7974 94:53 AM 3.08 (H) 0.61 - 1.24 mg/dL Final  90/78/7974 96:61 AM 3.16 (H) 0.61 - 1.24 mg/dL Final  90/79/7974 95:81 AM 2.63 (H) 0.61 - 1.24 mg/dL Final  90/80/7974 90:57 AM 2.53 (H) 0.61 - 1.24 mg/dL Final  93/73/7974 90:73 AM 1.46 (H) 0.76 - 1.27 mg/dL Final  89/91/7975 91:48 AM 1.60 (H) 0.76 - 1.27 mg/dL Final  93/74/7975 91:57 AM 1.47 0.40 - 1.50 mg/dL Final  94/77/7975 88:99 AM 1.74 (H) 0.40 - 1.50 mg/dL Final  94/80/7975 96:78 AM 1.29 (H) 0.61 - 1.24 mg/dL Final  94/81/7975 96:88 PM 1.37 (H) 0.61 - 1.24 mg/dL Final  94/81/7975 87:54 AM 1.54 (H) 0.61 - 1.24 mg/dL Final  94/82/7975 89:47 PM 1.80 (H) 0.61 - 1.24 mg/dL Final  94/82/7975 89:48 PM 1.66 (H) 0.61 - 1.24 mg/dL Final  96/72/7976 87:41 PM 1.60 (H) 0.40 - 1.50 mg/dL Final  96/77/7976 89:92 AM 1.75 (H) 0.40 - 1.50 mg/dL Final  96/97/7977 90:65 AM 1.41 0.40 - 1.50 mg/dL Final  98/79/7977 97:50 PM 1.35 (H) 0.76 - 1.27 mg/dL Final  90/98/7978 88:53 AM 1.27 0.76 - 1.27 mg/dL Final  87/96/7979 96:86 PM 1.25 0.40 - 1.50 mg/dL Final  87/73/7980 97:82 PM 1.32 (H) 0.61 - 1.24 mg/dL Final  89/88/7980 91:71 AM 1.34 0.40 - 1.50 mg/dL Final  88/84/7981 89:99 AM 1.17 0.40 - 1.50 mg/dL Final  98/76/7981 97:50 PM 1.11 0.40 - 1.50 mg/dL Final  98/83/7981 93:64 AM 1.18 0.61 - 1.24 mg/dL Final  98/84/7981 91:86 AM 1.74 (H) 0.61 - 1.24 mg/dL Final  98/84/7981 87:45 AM 1.78 (H) 0.61 - 1.24 mg/dL Final  98/85/7981 90:80 PM 1.85 (H) 0.61 - 1.24 mg/dL Final  98/85/7981 93:94 PM 2.31 (H) 0.61 - 1.24 mg/dL Final  98/85/7981 89:40 AM 2.76 (H) 0.61 - 1.24 mg/dL Final  88/71/7982 88:88 AM  1.18 0.40 - 1.50 mg/dL Final  87/83/7983 90:63 AM 1.15 0.40 - 1.50 mg/dL Final   Recent Labs  Lab 04/23/24 0445 04/23/24 1058 04/23/24 1059 04/24/24 0500 04/25/24 0547 04/26/24 0500 04/26/24 1650 04/27/24 0535 04/28/24 0628  NA 138   < > 139 140 135 139 138 137 137  K 3.6   < > 3.8 3.4* 3.3* 3.0* 3.1* 3.2* 3.3*  CL 102  --   --  103 100 100 98 99 95*  CO2 18*  --   --  20* 20* 25 25 26 25   GLUCOSE 133*  --   --  139* 147* 119* 101* 111* 133*  BUN 70*  --   --  67* 63* 57* 55* 55* 49*  CREATININE  4.82*  --   --  5.08* 5.17* 4.82* 4.88* 4.97* 4.72*  CALCIUM  8.9  --   --  9.1 8.8* 9.1 9.2 8.9 9.0   < > = values in this interval not displayed.   Recent Labs  Lab 04/25/24 0547 04/26/24 0500 04/27/24 0535 04/28/24 0628  WBC 7.6 6.0 6.9 6.4  HGB 8.2* 7.8* 8.1* 7.9*  HCT 26.0* 24.4* 25.7* 24.7*  MCV 83.3 82.7 83.4 83.2  PLT 331 312 320 317   Liver Function Tests: Recent Labs  Lab 04/23/24 0445  ALBUMIN 2.9*   No results for input(s): LIPASE, AMYLASE in the last 168 hours. No results for input(s): AMMONIA in the last 168 hours. Cardiac Enzymes: No results for input(s): CKTOTAL, CKMB, CKMBINDEX, TROPONINI in the last 168 hours. Iron Studies:  No results for input(s): IRON, TIBC, TRANSFERRIN, FERRITIN in the last 72 hours.  PT/INR: @LABRCNTIP (inr:5)  Xrays/Other Studies: ) Results for orders placed or performed during the hospital encounter of 04/17/24 (from the past 48 hours)  Glucose, capillary     Status: None   Collection Time: 04/26/24  4:24 PM  Result Value Ref Range   Glucose-Capillary 97 70 - 99 mg/dL    Comment: Glucose reference range applies only to samples taken after fasting for at least 8 hours.  Basic metabolic panel with GFR     Status: Abnormal   Collection Time: 04/26/24  4:50 PM  Result Value Ref Range   Sodium 138 135 - 145 mmol/L   Potassium 3.1 (L) 3.5 - 5.1 mmol/L   Chloride 98 98 - 111 mmol/L   CO2 25 22 - 32 mmol/L    Glucose, Bld 101 (H) 70 - 99 mg/dL    Comment: Glucose reference range applies only to samples taken after fasting for at least 8 hours.   BUN 55 (H) 6 - 20 mg/dL   Creatinine, Ser 5.11 (H) 0.61 - 1.24 mg/dL   Calcium  9.2 8.9 - 10.3 mg/dL   GFR, Estimated 13 (L) >60 mL/min    Comment: (NOTE) Calculated using the CKD-EPI Creatinine Equation (2021)    Anion gap 15 5 - 15    Comment: Performed at [facility], [address] [place], [place] [address]  Glucose, capillary     Status: Abnormal   Collection Time: 04/26/24  9:21 PM  Result Value Ref Range   Glucose-Capillary 165 (H) 70 - 99 mg/dL    Comment: Glucose reference range applies only to samples taken after fasting for at least 8 hours.   Comment 1 Notify RN    Comment 2 Document in Chart   Cooxemetry Panel (carboxy, met, total hgb, O2 sat)     Status: Abnormal   Collection Time: 04/27/24  5:35 AM  Result Value Ref Range   Total hemoglobin 8.8 (L) 12.0 - 16.0 g/dL   O2 Saturation 66.6 %   Carboxyhemoglobin 1.1 0.5 - 1.5 %   Methemoglobin <0.7 0.0 - 1.5 %    Comment: Performed at [facility], [address] [place], [place] [address]  Basic metabolic panel with GFR     Status: Abnormal   Collection Time: 04/27/24  5:35 AM  Result Value Ref Range   Sodium 137 135 - 145 mmol/L   Potassium 3.2 (L) 3.5 - 5.1 mmol/L   Chloride 99 98 - 111 mmol/L   CO2 26 22 - 32 mmol/L   Glucose, Bld 111 (H) 70 - 99 mg/dL    Comment: Glucose  reference range applies only to samples taken after fasting for at least 8 hours.   BUN 55 (H) 6 - 20 mg/dL   Creatinine, Ser 5.02 (H) 0.61 - 1.24 mg/dL   Calcium  8.9 8.9 - 10.3 mg/dL   GFR, Estimated 13 (L) >60 mL/min    Comment: (NOTE) Calculated using the CKD-EPI Creatinine Equation (2021)    Anion gap 12 5 - 15    Comment: Performed at [facility], [address] [place], [place] [address]  CBC     Status: Abnormal   Collection Time: 04/27/24  5:35 AM  Result Value Ref  Range   WBC 6.9 4.0 - 10.5 K/uL   RBC 3.08 (L) 4.22 - 5.81 MIL/uL   Hemoglobin 8.1 (L) 13.0 - 17.0 g/dL   HCT 74.2 (L) 60.9 - 47.9 %   MCV 83.4 80.0 - 100.0 fL   MCH 26.3 26.0 - 34.0 pg   MCHC 31.5 30.0 - 36.0 g/dL   RDW 84.0 (H) 88.4 - 84.4 %   Platelets 320 150 - 400 K/uL   nRBC 0.0 0.0 - 0.2 %    Comment: Performed at [facility], [address] [place], [place] [address]  Glucose, capillary     Status: Abnormal   Collection Time: 04/27/24  6:04 AM  Result Value Ref Range   Glucose-Capillary 107 (H) 70 - 99 mg/dL    Comment: Glucose reference range applies only to samples taken after fasting for at least 8 hours.   Comment 1 Notify RN    Comment 2 Document in Chart   Cooxemetry Panel (carboxy, met, total hgb, O2 sat)     Status: Abnormal   Collection Time: 04/27/24  7:00 AM  Result Value Ref Range   Total hemoglobin 8.4 (L) 12.0 - 16.0 g/dL   O2 Saturation 30 %   Carboxyhemoglobin 1.6 (H) 0.5 - 1.5 %   Methemoglobin <0.7 0.0 - 1.5 %    Comment: Performed at [facility], [address] [place], [place] [address]  Lactic acid, plasma     Status: None   Collection Time: 04/27/24  8:22 AM  Result Value Ref Range   Lactic Acid, Venous 1.4 0.5 - 1.9 mmol/L    Comment: Performed at [facility], [address] [place], [place] [address]  Cooxemetry Panel (carboxy, met, total hgb, O2 sat)     Status: Abnormal   Collection Time: 04/27/24  8:44 AM  Result Value Ref Range   Total hemoglobin 8.7 (L) 12.0 - 16.0 g/dL   O2 Saturation 36.6 %   Carboxyhemoglobin 0.6 0.5 - 1.5 %   Methemoglobin 2.7 (H) 0.0 - 1.5 %    Comment: Performed at [facility], [address] [place], [place] [address]  Glucose, capillary     Status: Abnormal   Collection Time: 04/27/24 11:18 AM  Result Value Ref Range   Glucose-Capillary 114 (H) 70 - 99 mg/dL    Comment: Glucose reference range applies only to samples taken after fasting for at least 8 hours.  Glucose, capillary      Status: Abnormal   Collection Time: 04/27/24  3:30 PM  Result Value Ref Range   Glucose-Capillary 122 (H) 70 - 99 mg/dL    Comment: Glucose reference range applies only to samples taken after fasting for at least 8 hours.  Glucose, capillary     Status: Abnormal   Collection Time: 04/27/24  9:49 PM  Result  Value Ref Range   Glucose-Capillary 102 (H) 70 - 99 mg/dL    Comment: Glucose reference range applies only to samples taken after fasting for at least 8 hours.  Basic metabolic panel with GFR     Status: Abnormal   Collection Time: 04/28/24  6:28 AM  Result Value Ref Range   Sodium 137 135 - 145 mmol/L   Potassium 3.3 (L) 3.5 - 5.1 mmol/L   Chloride 95 (L) 98 - 111 mmol/L   CO2 25 22 - 32 mmol/L   Glucose, Bld 133 (H) 70 - 99 mg/dL    Comment: Glucose reference range applies only to samples taken after fasting for at least 8 hours.   BUN 49 (H) 6 - 20 mg/dL   Creatinine, Ser 5.27 (H) 0.61 - 1.24 mg/dL   Calcium  9.0 8.9 - 10.3 mg/dL   GFR, Estimated 14 (L) >60 mL/min    Comment: (NOTE) Calculated using the CKD-EPI Creatinine Equation (2021)    Anion gap 17 (H) 5 - 15    Comment: Performed at [facility], [address] [place], [place] [address]  CBC     Status: Abnormal   Collection Time: 04/28/24  6:28 AM  Result Value Ref Range   WBC 6.4 4.0 - 10.5 K/uL   RBC 2.97 (L) 4.22 - 5.81 MIL/uL   Hemoglobin 7.9 (L) 13.0 - 17.0 g/dL   HCT 75.2 (L) 60.9 - 47.9 %   MCV 83.2 80.0 - 100.0 fL   MCH 26.6 26.0 - 34.0 pg   MCHC 32.0 30.0 - 36.0 g/dL   RDW 83.7 (H) 88.4 - 84.4 %   Platelets 317 150 - 400 K/uL   nRBC 0.0 0.0 - 0.2 %    Comment: Performed at [facility], [address], [place], [place] [address]  Cooxemetry Panel (carboxy, met, total hgb, O2 sat)     Status: Abnormal   Collection Time: 04/28/24  6:40 AM  Result Value Ref Range   Total hemoglobin 8.4 (L) 12.0 - 16.0 g/dL   O2 Saturation 36.8 %   Carboxyhemoglobin 1.5 0.5 - 1.5 %   Methemoglobin 0.8  0.0 - 1.5 %    Comment: Performed at [facility], [address] [place], [place] [address]  Glucose, capillary     Status: Abnormal   Collection Time: 04/28/24  6:57 AM  Result Value Ref Range   Glucose-Capillary 128 (H) 70 - 99 mg/dL    Comment: Glucose reference range applies only to samples taken after fasting for at least 8 hours.  Prepare RBC (crossmatch)     Status: None   Collection Time: 04/28/24 10:55 AM  Result Value Ref Range   Order Confirmation      ORDER PROCESSED BY BLOOD BANK Performed at [facility], [address], [place], [place] [address]   Type and screen [facility]     Status: None (Preliminary result)   Collection Time: 04/28/24 10:55 AM  Result Value Ref Range   ABO/RH(D) PENDING    Antibody Screen PENDING    Sample Expiration      05/01/2024,2359 Performed at [facility], [address] [place], [place] [address]   Cooxemetry Panel (carboxy, met, total hgb, O2 sat)     Status: Abnormal   Collection Time: 04/28/24 10:55 AM  Result Value Ref Range   Total hemoglobin 11.6 (L) 12.0 - 16.0 g/dL   O2 Saturation 42.2 %  Carboxyhemoglobin 1.0 0.5 - 1.5 %   Methemoglobin 2.3 (H) 0.0 - 1.5 %    Comment: Performed at [facility], [address] [place], [place] [address]  Glucose, capillary     Status: Abnormal   Collection Time: 04/28/24 11:32 AM  Result Value Ref Range   Glucose-Capillary 138 (H) 70 - 99 mg/dL    Comment: Glucose reference range applies only to samples taken after fasting for at least 8 hours.   No results found.   PMH:   Past Medical History:  Diagnosis Date   Blood in stool    bright red blood    Chicken pox    Diabetes mellitus (HCC)    Erectile dysfunction    Gout    Hypertension    Stroke [facility])     PSH:   Past Surgical History:  Procedure Laterality Date   APPENDECTOMY  2004   COLONOSCOPY     COLONOSCOPY N/A 04/22/2024   Procedure: COLONOSCOPY;  Surgeon: Stacia Glendia BRAVO, MD;  Location: [facility] ENDOSCOPY;  Service: Gastroenterology;  Laterality: N/A;   ENDOMYOCARDIAL BIOPSY N/A 03/16/2024   Procedure: ENDOMYOCARDIAL BIOPSY;  Surgeon: Zenaida Morene PARAS, MD;  Location: [facility] INVASIVE CV LAB;  Service: Cardiovascular;  Laterality: N/A;   ESOPHAGOGASTRODUODENOSCOPY N/A 04/19/2024   Procedure: EGD (ESOPHAGOGASTRODUODENOSCOPY);  Surgeon: Stacia Glendia BRAVO, MD;  Location: [facility] ENDOSCOPY;  Service: Gastroenterology;  Laterality: N/A;   RIGHT HEART CATH N/A 03/16/2024   Procedure: RIGHT HEART CATH;  Surgeon: Zenaida Morene PARAS, MD;  Location: [facility] INVASIVE CV LAB;  Service: Cardiovascular;  Laterality: N/A;   RIGHT HEART CATH N/A 04/23/2024   Procedure: RIGHT HEART CATH;  Surgeon: Rolan Ezra RAMAN, MD;  Location: [facility] INVASIVE CV LAB;  Service: Cardiovascular;  Laterality: N/A;   TOOTH EXTRACTION  07/06/2020    Allergies: No Known Allergies  Medications:   Prior to Admission medications   Medication Sig Start Date End Date Taking? Authorizing Provider  apixaban  (ELIQUIS ) 5 MG TABS tablet Take 1 tablet (5 mg total) by mouth 2 (two) times daily. 04/08/24  Yes Hayes Beckey CROME, NP  bisoprolol  (ZEBETA ) 10 MG tablet Take 2 tablets (20 mg total) by mouth daily. 04/08/24  Yes Hayes Beckey CROME, NP  buPROPion  (WELLBUTRIN  XL) 300 MG 24 hr tablet TAKE 1 TABLET BY MOUTH ONCE  DAILY 04/13/24  Yes Nafziger, Darleene, NP  colchicine  0.6 MG tablet TAKE 1 TABLET BY MOUTH DAILY  WHEN HAVING FLARES TAKE 1 TABLET BY MOUTH TWICE DAILY Patient taking differently: Take 0.6 mg by mouth as needed. TAKE 1 TABLET BY MOUTH  DAILY WHEN HAVING FLARES  TAKE 1 TABLET BY MOUTH  TWICE DAILY 09/05/23  Yes Nafziger, Darleene, NP  empagliflozin  (JARDIANCE ) 10 MG TABS tablet Take 1 tablet (10 mg total) by mouth daily. 04/08/24  Yes Hayes Beckey CROME, NP  furosemide  (LASIX ) 20 MG tablet Take 2 tablets (40 mg total) by mouth as needed (For increased swelling, weight gain of 3 LB in 1 day or 5 LB in 1 week). 04/08/24  Yes Hayes Beckey CROME,  NP  hydrOXYzine (VISTARIL) 50 MG capsule Take 1 capsule (50 mg total) by mouth every 8 (eight) hours as needed. Patient taking differently: Take 50 mg by mouth every 8 (eight) hours as needed for itching. 03/23/24  Yes Nafziger, Darleene, NP  magnesium  oxide (MAG-OX) 400 (240 Mg) MG tablet TAKE 1 TABLET(400 MG) BY MOUTH DAILY 02/12/24  Yes West, Katlyn D, NP  Multiple Vitamins-Minerals (ONE-A-DAY MENS 50+) TABS  Take 1 tablet by mouth daily with breakfast.   Yes [provider]  omeprazole  (PRILOSEC) 40 MG capsule TAKE 1 CAPSULE BY MOUTH DAILY Patient taking differently: Take 40 mg by mouth daily before breakfast. 11/12/23  Yes Nafziger, Darleene, NP  potassium chloride  SA (KLOR-CON  M) 20 MEQ tablet Take 2 tablets (40 mEq total) by mouth daily as needed (take with lasix  as needed dose). 04/08/24  Yes Hayes Beckey CROME, NP  rosuvastatin  (CRESTOR ) 20 MG tablet Take 1 tablet (20 mg total) by mouth daily. 04/08/24  Yes Hayes Beckey CROME, NP  thiamine  (VITAMIN B-1) 100 MG tablet Take 1 tablet (100 mg total) by mouth daily. 11/11/22  Yes Toberman, Stevi W, NP  triamcinolone cream (KENALOG) 0.5 % Apply 1 Application topically 2 (two) times daily. 03/23/24  Yes Nafziger, Darleene, NP  blood glucose meter kit and supplies Dispense based on patient and insurance preference. Use up to four times daily as directed. (FOR ICD-9 250.00, 250.01). 07/09/16   Sebastian Toribio GAILS, MD  glucose blood (ONETOUCH ULTRA TEST) test strip Use to check blood sugar 2-3 times a day. 03/01/24   Trixie File, MD  Insulin  Pen Needle (B-D UF III MINI PEN NEEDLES) 31G X 5 MM MISC USE 4 TIMES DAILY AFTER MEALS AND AT BEDTIME 02/24/20   Trixie File, MD  Lancets [facility] ULTRASOFT) lancets Test blood sugars as directed. Dx E11.9 08/06/16   Nafziger, Cory, NP  nicotine  (NICODERM CQ  - DOSED IN MG/24 HR) 7 mg/24hr patch Place 1 patch (7 mg total) onto the skin daily. Patient not taking: Reported on 04/17/2024 03/19/24   Fairy Frames, MD  potassium  chloride (KLOR-CON ) 10 MEQ tablet TAKE 1 TABLET(10 MEQ) BY MOUTH DAILY Patient not taking: Reported on 04/17/2024 04/08/24   Hayes Beckey CROME, NP    Discontinued Meds:   Medications Discontinued During This Encounter  Medication Reason   CLEAR EYES NATURAL TEARS 5-6 MG/ML SOLN No longer needed (for PRN medications)   allopurinol  (ZYLOPRIM ) 300 MG tablet Patient Preference   apixaban  (ELIQUIS ) tablet 5 mg    midodrine (PROAMATINE) tablet 10 mg    pantoprazole  (PROTONIX ) injection 40 mg P&T Policy: IV to PO Conversion   Na Sulfate-K Sulfate-Mg Sulfate concentrate (SUPREP) kit 177 mL    Na Sulfate-K Sulfate-Mg Sulfate concentrate (SUPREP) kit 177 mL    simethicone  (MYLICON) chewable tablet 240 mg    simethicone  (MYLICON) chewable tablet 240 mg    sodium chloride  0.9 % bolus 500 mL    midodrine (PROAMATINE) tablet 10 mg    furosemide  (LASIX ) 120 mg in dextrose  5 % 50 mL IVPB    DOBUTamine (DOBUTREX) infusion 4000 mcg/mL    furosemide  (LASIX ) 120 mg in dextrose  5 % 50 mL IVPB    Heparin  (Porcine) in NaCl 1000-0.9 UT/500ML-% SOLN Patient Transfer   lidocaine  (PF) (XYLOCAINE ) 1 % injection Patient Transfer   potassium chloride  SA (KLOR-CON  M) CR tablet 40 mEq    potassium chloride  SA (KLOR-CON  M) CR tablet 40 mEq    potassium chloride  SA (KLOR-CON  M) CR tablet 40 mEq    0.9 %  sodium chloride  infusion (Manually program via Guardrails IV Fluids) One time medication   iron sucrose (VENOFER) 200 mg in sodium chloride  0.9 % 100 mL IVPB Inpatient Standard   potassium chloride  SA (KLOR-CON  M) CR tablet 40 mEq     Social History:  reports that he quit smoking about 7 weeks ago. His smoking use included cigarettes and cigars. He has never used  smokeless tobacco. He reports that he does not currently use alcohol. He reports that he does not currently use drugs after having used the following drugs: Marijuana.  Family History:   Family History  Problem Relation Age of Onset   Alcohol abuse Father     Hypertension Father    Heart disease Father    Gout Father    Breast cancer Mother    Hypertension Mother    Gout Mother    Healthy Sister    Healthy Daughter    Colon cancer Neg Hx    Esophageal cancer Neg Hx    Rectal cancer Neg Hx    Stomach cancer Neg Hx     Blood pressure 90/70, pulse 70, temperature 98 F (36.7 C), temperature source Oral, resp. rate 16, height 6' 5 (1.956 m), weight 100.4 kg, SpO2 98%. Physical Exam: General exam: Appears calm and comfortable  Respiratory system: CTA b/l, no rales Cardiovascular system: S1 & S2 heard, RRR.   Gastrointestinal system: Abdomen is nondistended, soft and nontender. Normal bowel sounds heard. Central nervous system: Alert and oriented. No focal neurological deficits. Extremities: LE edema  Skin: No rashes, lesions or ulcers     Ginnifer Creelman, LYNWOOD ORN, MD 04/28/2024, 11:46 AM

## 2024-04-29 DIAGNOSIS — I5043 Acute on chronic combined systolic (congestive) and diastolic (congestive) heart failure: Secondary | ICD-10-CM | POA: Diagnosis not present

## 2024-04-29 LAB — BPAM RBC
Blood Product Expiration Date: 202512032359
ISSUE DATE / TIME: 202511051357
Unit Type and Rh: 5100

## 2024-04-29 LAB — BASIC METABOLIC PANEL WITH GFR
Anion gap: 15 (ref 5–15)
BUN: 50 mg/dL — ABNORMAL HIGH (ref 6–20)
CO2: 27 mmol/L (ref 22–32)
Calcium: 9 mg/dL (ref 8.9–10.3)
Chloride: 96 mmol/L — ABNORMAL LOW (ref 98–111)
Creatinine, Ser: 4.78 mg/dL — ABNORMAL HIGH (ref 0.61–1.24)
GFR, Estimated: 14 mL/min — ABNORMAL LOW (ref >60)
Glucose, Bld: 106 mg/dL — ABNORMAL HIGH (ref 70–99)
Potassium: 3.1 mmol/L — ABNORMAL LOW (ref 3.5–5.1)
Sodium: 138 mmol/L (ref 135–145)

## 2024-04-29 LAB — GLUCOSE, CAPILLARY
Glucose-Capillary: 101 mg/dL — ABNORMAL HIGH (ref 70–99)
Glucose-Capillary: 143 mg/dL — ABNORMAL HIGH (ref 70–99)
Glucose-Capillary: 139 mg/dL — ABNORMAL HIGH (ref 70–99)
Glucose-Capillary: 120 mg/dL — ABNORMAL HIGH (ref 70–99)

## 2024-04-29 LAB — CBC
HCT: 27.1 % — ABNORMAL LOW (ref 39.0–52.0)
Hemoglobin: 8.7 g/dL — ABNORMAL LOW (ref 13.0–17.0)
MCH: 26.6 pg (ref 26.0–34.0)
MCHC: 32.1 g/dL (ref 30.0–36.0)
MCV: 82.9 fL (ref 80.0–100.0)
Platelets: 332 K/uL (ref 150–400)
RBC: 3.27 MIL/uL — ABNORMAL LOW (ref 4.22–5.81)
RDW: 16.4 % — ABNORMAL HIGH (ref 11.5–15.5)
WBC: 6.6 K/uL (ref 4.0–10.5)
nRBC: 0.3 % — ABNORMAL HIGH (ref 0.0–0.2)

## 2024-04-29 LAB — COOXEMETRY PANEL
Carboxyhemoglobin: 2.8 % — ABNORMAL HIGH (ref 0.5–1.5)
Methemoglobin: 0.7 % (ref 0.0–1.5)
O2 Saturation: 67.9 %
Total hemoglobin: 9 g/dL — ABNORMAL LOW (ref 12.0–16.0)

## 2024-04-29 LAB — TYPE AND SCREEN
ABO/RH(D): O POS
Antibody Screen: NEGATIVE
Unit division: 0

## 2024-04-29 MED ORDER — TORSEMIDE 20 MG PO TABS
40.0000 mg | ORAL_TABLET | Freq: Two times a day (BID) | ORAL | Status: DC
  Administered 2024-04-29 (×2): 40 mg via ORAL
  Filled 2024-04-29 (×2): qty 2

## 2024-04-29 MED ORDER — TRAZODONE HCL 50 MG PO TABS
50.0000 mg | ORAL_TABLET | Freq: Every day | ORAL | Status: DC
  Administered 2024-04-29 – 2024-04-30 (×2): 50 mg via ORAL
  Filled 2024-04-29: qty 1

## 2024-04-29 MED ORDER — POTASSIUM CHLORIDE CRYS ER 20 MEQ PO TBCR
40.0000 meq | EXTENDED_RELEASE_TABLET | ORAL | Status: AC
  Administered 2024-04-29 (×2): 40 meq via ORAL
  Filled 2024-04-29 (×2): qty 2

## 2024-04-29 MED ORDER — POLYETHYLENE GLYCOL 3350 17 G PO PACK
17.0000 g | PACK | Freq: Every day | ORAL | Status: DC | PRN
  Administered 2024-04-29 – 2024-04-30 (×2): 17 g via ORAL
  Filled 2024-04-29 (×2): qty 1

## 2024-04-29 NOTE — Progress Notes (Signed)
 PROGRESS NOTE Keith Leblanc  FMW:[id number] DOB: 1965-11-05 DOA: 04/17/2024 PCP: [name], NP  Brief Narrative/Hospital Course: Keith Leblanc is a 58 y.o. male with PMH of of prior stroke, hypertension, diabetes who presented to the ED due to shortness of breath w/ progressive worsening over the last week and noticed bright red blood in stool , he in ED BP soft, labs showed bicarb 21, creatinine 3.1 baseline around 3, WBC 8.0, hemoglobin 7.5 baseline around 12, troponin 59, 54, INR 1.6.   CXR>>no acute changes.GI was consulted and started on IV PPI BID.  S/P 1 unit of packed RBCs for Hg 6.8. His Eliquis  was held. Last colonoscopy in 2019 which showed diverticulosis and multiple polyps.  Patient endorsed improving shortness of breath following transfusion no chest pain. Underwent EGD 10/27 has congested friable gastric mucosa likely the source of bleeding  10/30 colonoscopy showed hemorrhoids likely cause of patient's blood in the stool Patient having ongoing AKI nephro cardio following 10/31 underwent RHC, and had central line placement-patient was started on dobutamine drip 1. Elevated right-sided filling pressures out of proportion to mildly elevated PCWP.  Mild pulmonary venous hypertension Low cardiac output by thermo though preserved by Fick. Low PAPi>S uspect primarily RV dysfunction> started dobutamine 2.5 for RV support.  Subjective: Seen and examined Reports feels better today.  No complaints Overnight afebrile BP softer side 88/52 Labs with hyperkalemia creatinine about the same 4.7 hemoglobin 8.7 This am Co-ox 67% Weight on admission 232 > 237  234>228>224>221> 220 today Sleeping poorly overnight and having delirium in night- aaox3 now  Assessment and plan:  Acute on chronic CHF with midrange EF 43% Severe LVH Abnormal RV free wall strain ~ 17% HCM Hypotension Nonsustained V. tach: lvEF 45-50% in 01/2024, now at 43%, RV mildly reduced-previously endomyocardial  biopsy negative for cardiac amyloidosis-Suspect HCM.  Receiving IV Lasix  along with midodrine due to some hypotension> creatinine started to trend up S/p RHC 10/31-with RV dysfunction (see report) s/ p central line and started dobutamine drip and high-dose Lasix , and metolazone few doses GDMT limited due to hypotension and renal failure. Remains on dobutamine drip-dose decreased to 2, Lasix  stopping and switch to torsemide 40 twice daily 11/6 Advanced heart failure team managing input appreciated>, Dr. Bensimhon discussed with [facility] transplant about referral for heart kidney transplant and they will see him in the outpatient.  RUQ ultrasound done to rule out cirrhosis, report pending Monitor daily I/O,weight, electrolytes and net balance as below.Keep on  salt/fluid restricted diet and monitor in tele. Net IO Since Admission: -6,468.44 mL [04/29/24 1120]  Filed Weights   04/27/24 0500 04/28/24 0448 04/29/24 0604  Weight: 101.7 kg 100.4 kg 99.9 kg    AKI on CKD stage iv Metabolic acidosis Hypokalemia: Creat b/l 1.5-2.5.likely multifactorial initially suspecting AKI on CKD due to GI bleeding, severe anemia, hypotension,RV dysfunction/CHF and need for diuretics> now appears to be CRS-hopefully did not develop ATN.  Now looking into heart and kidney transplant at [facility] following closely-monitor renal function, replace electrolytes, monitor intake output. Avoid nephrotoxic medications including NSAIDs and iodinated intravenous contrast exposure unless the latter is absolutely indicated.Preferred narcotic agents for pain control jmz:ybimnfnmeynwz, fentanyl , and methadone and avoid morphine.Avoid Baclofen and avoid oral sodium phosphate and magnesium  citrate based laxatives / bowel preps. Continue strict Input and Output monitoring and serial renal functions.  Recent Labs    04/21/24 0319 04/22/24 0404 04/23/24 0445 04/23/24 1058 04/24/24 0500 04/25/24 0547 04/26/24 0500 04/26/24 1650  04/27/24 0535 04/28/24 9371 04/29/24  0422  BUN 77* 71* 70*  --  67* 63* 57* 55* 55* 49* 50*  CREATININE 4.25* 4.49* 4.82*  --  5.08* 5.17* 4.82* 4.88* 4.97* 4.72* 4.78*  CO2 18* 20* 18*  --  20* 20* 25 25 26 25 27   K 3.8 4.0 3.6   < > 3.4* 3.3* 3.0* 3.1* 3.2* 3.3* 3.1*   < > = values in this interval not displayed.   Intake/Output Summary (Last 24 hours) at 04/29/2024 1120 Last data filed at 04/29/2024 9167 Gross per 24 hour  Intake 654.8 ml  Output 1550 ml  Net -895.2 ml   ABLA due to upper GI bleed with friable congested gastric mucosa Hematochezia due to internal hemorrhoids:  history of LA grade a reflux esophagitis and gastritis seen on EGD in 2019. S/p EGD 10/27: congested friable gastric mucosa likely the source of bleeding.  Colonoscopy 10/30-internal hemorrhoids likely cause of hematochezia doubt it accounts for patient's drop in hemoglobin. So far received 3 units PRBC- hb holding >8 gm, S/P IV iron Continue to monitor hemoglobin. Eliquis  resumed 11/1 after GI clearance, cont ppi  Recent Labs  Lab 04/26/24 0500 04/27/24 0535 04/28/24 0628 04/28/24 2030 04/29/24 0422  HGB 7.8* 8.1* 7.9* 8.8* 8.7*  HCT 24.4* 25.7* 24.7* 28.4* 27.1*     Chronic depression Insomnia/delirium overnight: Patient having sleep disorder delirium overnight, placed on trazodone as needed.  Continue home bupropion .     Elevated troponin: Due to demand ischemia.  Plan per cardiology  Hyperlipidemia: continue  crestor    Persistent A-fib: Eliquis  resumed 11/1.Rate is stable monitor in telemetry  DVT prophylaxis: Place TED hose Start: 04/21/24 1237 SCDs Start: 04/17/24 2333 Code Status:   Code Status: Full Code Family Communication: plan of care discussed with patient  Patient status is: Remains hospitalized because of severity of illness Level of care: Progressive transfer to telemetry  Dispo: The patient is from: HOME alone, not married.            Anticipated disposition: Home after  cardiac clearance 1-2 days  Objective: Vitals last 24 hrs: Vitals:   04/29/24 0005 04/29/24 0415 04/29/24 0604 04/29/24 0718  BP: 107/70 (!) 106/94  (!) 88/52  Pulse: 65 64  64  Resp: 16 19  19   Temp: 98.2 F (36.8 C) 98.1 F (36.7 C)    TempSrc: Oral Temporal    SpO2: 93% 98%  99%  Weight:   99.9 kg   Height:       Physical Examination: General exam: AAOX3 HEENT:Oral mucosa moist, Ear/Nose WNL grossly Respiratory system: CTTA B/L Cardiovascular system: S1 & S2 +, No JVD. Gastrointestinal system: Abdomen soft,NT,ND, BS+ Nervous System: Alert, awake, moving all extremities Extremities: extremities warm, leg edema neg Skin: Warm, no rashes MSK: Normal muscle bulk,tone, power   Medications reviewed:  Scheduled Meds:  sodium chloride    Intravenous Once   apixaban   5 mg Oral BID   buPROPion   300 mg Oral Daily   Chlorhexidine  Gluconate Cloth  6 each Topical Daily   insulin  aspart  0-9 Units Subcutaneous TID WC   midodrine  15 mg Oral TID WC   pantoprazole   40 mg Oral BID   potassium chloride   40 mEq Oral Q3H   rosuvastatin   20 mg Oral Daily   simethicone   240 mg Oral Once   sodium chloride  flush  10-40 mL Intracatheter Q12H   torsemide  40 mg Oral BID   traZODone  50 mg Oral Daily  Continuous Infusions:  DOBUTamine  2 mcg/kg/min (04/29/24 9090)   iron sucrose 200 mg (04/28/24 1223)  Diet: Diet Order             Diet Heart Room service appropriate? Yes; Fluid consistency: Thin  Diet effective now                    Data Reviewed: I have personally reviewed following labs and imaging studies ( see epic result tab) CBC: Recent Labs  Lab 04/25/24 0547 04/26/24 0500 04/27/24 0535 04/28/24 0628 04/28/24 2030 04/29/24 0422  WBC 7.6 6.0 6.9 6.4  --  6.6  HGB 8.2* 7.8* 8.1* 7.9* 8.8* 8.7*  HCT 26.0* 24.4* 25.7* 24.7* 28.4* 27.1*  MCV 83.3 82.7 83.4 83.2  --  82.9  PLT 331 312 320 317  --  332   CMP: Recent Labs  Lab 04/23/24 0445 04/23/24 1058  04/24/24 0500 04/25/24 0547 04/25/24 0900 04/26/24 0500 04/26/24 1650 04/27/24 0535 04/28/24 0628 04/29/24 0422  NA 138   < > 140   < >  --  139 138 137 137 138  K 3.6   < > 3.4*   < >  --  3.0* 3.1* 3.2* 3.3* 3.1*  CL 102  --  103   < >  --  100 98 99 95* 96*  CO2 18*  --  20*   < >  --  25 25 26 25 27   GLUCOSE 133*  --  139*   < >  --  119* 101* 111* 133* 106*  BUN 70*  --  67*   < >  --  57* 55* 55* 49* 50*  CREATININE 4.82*  --  5.08*   < >  --  4.82* 4.88* 4.97* 4.72* 4.78*  CALCIUM  8.9  --  9.1   < >  --  9.1 9.2 8.9 9.0 9.0  MG 2.1  --  2.0  --  1.8 2.0  --   --   --   --    < > = values in this interval not displayed.   GFR: Estimated Creatinine Clearance: 21.7 mL/min (A) (by C-G formula based on SCr of 4.78 mg/dL (H)). Recent Labs  Lab 04/23/24 0445  ALBUMIN 2.9*   No results for input(s): LIPASE, AMYLASE in the last 168 hours. No results for input(s): AMMONIA in the last 168 hours. Coagulation Profile:  No results for input(s): INR, PROTIME in the last 168 hours.  Unresulted Labs (From admission, onward)     Start     Ordered   04/27/24 0500  Basic metabolic panel with GFR  Daily,   R     Question:  Specimen collection method  Answer:  Unit=Unit collect   04/26/24 0830   04/27/24 0500  CBC  Daily,   R     Question:  Specimen collection method  Answer:  Unit=Unit collect   04/26/24 0830   04/23/24 1117  Cooxemetry Panel (carboxy, met, total hgb, O2 sat)  Daily,   R     Question:  Specimen collection method  Answer:  Lab=Lab collect   04/23/24 1116           Antimicrobials/Microbiology: Anti-infectives (From admission, onward)    None         Component Value Date/Time   SDES BLOOD LEFT ANTECUBITAL 07/07/2016 1225   SPECREQUEST BOTTLES DRAWN AEROBIC AND ANAEROBIC 5CC 07/07/2016 1225   CULT  07/07/2016 1225    NO GROWTH 5 DAYS Performed at [facility], [address], [place], [place] [address]    REPTSTATUS 07/12/2016 FINAL  07/07/2016 1225    Procedures: Procedure(s) (LRB): RIGHT HEART CATH (N/A)   Mennie LAMY, MD [facility] 04/29/2024, 11:21 AM

## 2024-04-29 NOTE — Progress Notes (Addendum)
 Patient ID: Keith Leblanc, male   DOB: 12-Jul-1965, 58 y.o.   MRN: [id number]     Advanced Heart Failure Rounding Note  Cardiologist: None  Chief Complaint: A/c HFmrEF, GIB  Patient Profile   58 y/o male w/ prior h/o heavy ETOH use (recently quit), HFmrEF, CKDIIIa, PAF on Eliquis , HTN, HLD and Type 2DM, admitted w/ symptomatic ABLA 2/2 GIB. HF management c/b persistent hypotension and AKI despite adequate volume resuscitation. AHF team asked to assist.   Subjective:    Echo this admit EF 40-45% (previously 45-50%), severe LVH, RV mod reduced, RVSP 42, dilated IVC estimated RAP ~15 mmHg   Co-ox 68%. CVP 10-11. On DBA 3 Net negative 775cc. Weight down 1 lbs. sCr 4.88>4.97>4.72>4.78  Sitting up in chair. Was having issues with delerium overnight, sleeping poorly. No SOB, CP.   Objective:    Weight Range: 99.9 kg Body mass index is 26.12 kg/m.   Vital Signs:   Temp:  [98 F (36.7 C)-98.4 F (36.9 C)] 98.1 F (36.7 C) (11/06 0415) Pulse Rate:  [64-74] 64 (11/06 0718) Resp:  [16-20] 19 (11/06 0718) BP: (77-107)/(52-94) 88/52 (11/06 0718) SpO2:  [93 %-100 %] 99 % (11/06 0718) Weight:  [99.9 kg] 99.9 kg (11/06 0604) Last BM Date : 04/27/24  Weight change: Filed Weights   04/27/24 0500 04/28/24 0448 04/29/24 0604  Weight: 101.7 kg 100.4 kg 99.9 kg   Intake/Output:  Intake/Output Summary (Last 24 hours) at 04/29/2024 0814 Last data filed at 04/29/2024 0440 Gross per 24 hour  Intake 774.8 ml  Output 1550 ml  Net -775.2 ml    Physical Exam   General: Well appearing. No distress on RA Cardiac: S1 and S2 present. 2/6 systolic murmur at apex Resp: Lung sounds clear and equal B/L Extremities: Warm and dry.  Trace BLE edema.  Neuro: Alert and oriented x3. Affect pleasant. Moves all extremities without difficulty. Anxious  EKG   AF 60s (personally reviewed)  Labs    CBC Recent Labs    04/28/24 0628 04/28/24 2030 04/29/24 0422  WBC 6.4  --  6.6  HGB 7.9* 8.8* 8.7*   HCT 24.7* 28.4* 27.1*  MCV 83.2  --  82.9  PLT 317  --  332   Basic Metabolic Panel Recent Labs    88/94/74 0628 04/29/24 0422  NA 137 138  K 3.3* 3.1*  CL 95* 96*  CO2 25 27  GLUCOSE 133* 106*  BUN 49* 50*  CREATININE 4.72* 4.78*  CALCIUM  9.0 9.0   BNP (last 3 results) Recent Labs    03/24/24 1624 04/17/24 1704  BNP 1,093.8* 1,324.0*   ProBNP (last 3 results) Recent Labs    03/12/24 0942  PROBNP 5,364.0*   Medications:    Scheduled Medications:  sodium chloride    Intravenous Once   apixaban   5 mg Oral BID   buPROPion   300 mg Oral Daily   Chlorhexidine  Gluconate Cloth  6 each Topical Daily   insulin  aspart  0-9 Units Subcutaneous TID WC   midodrine  15 mg Oral TID WC   pantoprazole   40 mg Oral BID   rosuvastatin   20 mg Oral Daily   simethicone   240 mg Oral Once   sodium chloride  flush  10-40 mL Intracatheter Q12H    Infusions:  DOBUTamine 3 mcg/kg/min (04/29/24 0400)   furosemide  120 mg (04/28/24 1825)   iron sucrose 200 mg (04/28/24 1223)    PRN Medications: acetaminophen  **OR** acetaminophen , alum & mag hydroxide-simeth, ondansetron  **OR** ondansetron  (ZOFRAN ) IV,  sodium chloride  flush, traMADol, traZODone  Assessment/Plan   1. Acute on chronic HF with mid range EF: Echo in 8/25 showed EF 45-50%, severe LVH, abnormal RV free wall strain, ~17%, RV mildly reduced.  Cardiac MRI showed moderate concentric LVH with EF 43%, RV EF 47%, ECV 36%, diffuse patchy LGE throughout LV myocardium. PYP scan negative.  He had an endomyocardial biopsy that was negative for cardiac amyloidosis. Suspect hypertrophic cardiomyopathy.  - DBA increased to 4 with persistent low output, co-ox 33>30.  - Co-ox 67% today on DBA 3. Wean DBA to 2. - CVP 10. Stop IV Lasix . Start Torsemide 40 mg bid - GDMT limited with low output, CKD, and hypotension.  - Continue current midodrine 15 mg tid for now to maintain MAP.  - remains inotrope dependent, attempting slow wean of DBA. Dr.  Cherrie discussed with [facility] transplant about referral for heart-kidney. They will see him in the outpatient. Need to determine if home DBA is needed  2. AKI on CKD stage 3b: Renal US  negative. ?ATN in setting of hypotension from GI bleeding.   - sCr ~4.8 - nephrology now following, conducting w/u to exclude nephrotic syndrome  - Renal US  normal - Multiple Myeloma panel 9/25 negative for M-spike protein   3. Atrial fibrillation: Persistent  - Rate controlled.  - Eliquis  restarted with resolution of overt GI bleeding.  - will discuss possible amio load and DCCV again today with Dr. Brentley Landfair   4. GI bleeding: Has had transfusions.  EGD with gastritis.  Colonoscopy w/ 2 polyps (resected) and non bleeding hemorrhoids. - No bleeding.  - Transferrin saturation 4%, given IV Fe.  - Hgb stable 7.9>1u RBCs>8.7 - Continue eliquis     5. ETOH abuse.  - he reports he quit September 16th. Consult TOC for alcohol cessation program.  - RUQ US  complete, pending read  6. Delirium - add sleep agent  Length of Stay: 54  Jordan Lee, NP  04/29/2024, 8:14 AM   Patient seen and examined with the above-signed Advanced Practice Provider and/or Housestaff. I personally reviewed laboratory data, imaging studies and relevant notes. I independently examined the patient and formulated the important aspects of the plan. I have edited the note to reflect any of my changes or salient points. I have personally discussed the plan with the patient and/or family.  Slept poorly. Has some delirium overnight. Now resolved  DBA down to 3. CO-ox 67% CVP 10  Scr stable  General:  Sitting up in bed. No resp difficulty HEENT: normal Neck: supple. JVP 10 Cor: Irregular rate & rhythm. No rubs, gallops or murmurs. Lungs: clear Abdomen: soft, nontender, nondistended.Good bowel sounds. Extremities: no cyanosis, clubbing, rash, edema Neuro: alert & orientedx3, cranial nerves grossly intact. moves all 4 extremities w/o  difficulty. Affect pleasant  Tolerating DBA wean. Will continue to wean slowly. Switch IV lasix  to po.   Given duration of AF will not attempt DC-CV.   Continue to work toward dual-organ transplant eval (O pos). Await RUQ u/s.   Toribio Cherrie, MD  9:38 AM

## 2024-04-29 NOTE — Progress Notes (Signed)
 Daily Progress Note   Patient Name: Keith Leblanc       Date: 04/29/2024 DOB: 23-Jun-1966  Age: 58 y.o. MRN#: [id number] Attending Physician: Christobal Guadalajara, MD Primary Care Physician: [name], NP Admit Date: 04/17/2024 Length of Stay: 12 days  Reason for Consultation/Follow-up: Establishing goals of care  Subjective:   Subjective: EMR reviewed including notes from hospitalist, nephrology, advanced heart failure team, and transition of care.  Labs reviewed from this morning and remarkable for potassium 3.1 creatinine 4.78 and hemoglobin 8.7.  He is status post 1 unit packed red blood cells yesterday.  I met today with Keith Leblanc.  He was sitting in bedside chair and open to conversation.  Reviewed care plan as outlined in chart that he will hopefully be able to wean from dobutamine with a plan to follow-up with [facility] transplant as an outpatient.  We discussed how he is coping with his overall situation.  He said it often does not feel real that he may be looking at need for 2 organ transplant as he reports that he does not feel that sick.  He does understand the gravity of his situation and remains invested in doing what ever he can to maximize his current functional status and improve his eligibility for organ transplant if necessary.  He tells me that he works as a production designer, theatre/television/film for [employer].  His support system consists of his girlfriend, Bernarda, his sister, and his 64 year old daughter.  We reviewed advance care planning and surrogate decision making.  Legally, his daughter will be his surrogate.  He reports he is comfortable with this and talk with her about it last night.  We discussed potential completion of HCPOA documentation, but he also understands she would be his legal surrogate if he does not complete any paperwork.  Review of Systems Some dyspnea with exertion  Objective:   Vital Signs:  BP 102/76 (BP Location: Left Arm)   Pulse 66   Temp 98.1 F (36.7 C) (Oral)   Resp  (!) 22   Ht 6' 5 (1.956 m)   Wt 99.9 kg   SpO2 99%   BMI 26.12 kg/m   Physical Exam: General: NAD, alert Eyes: conjunctiva clear, anicteric sclera HENT: normocephalic, atraumatic, moist mucous membranes Cardiovascular: RRR, 1+ edema in LE b/l Respiratory: no increased work of breathing noted, not in respiratory distress Abdomen: not distended Skin: no rashes or lesions on visible skin Neuro: A&Ox4, following commands easily Psych: appropriately answers all questions, very pleasant  Imaging: @IMAGES @  I personally reviewed recent imaging.   Assessment & Plan:   Assessment: 58 year old male with GI bleed, heart failure, and AKI on CKD.  Recommendations/Plan: # Complex medical decision making/goals of care:  - Goal remains full code/full scope treatment with a hope of being able to discharge from the hospital and follow-up with an outpatient appointment at [facility] for further consideration for transplant.  - Discussed surrogate decision making.  He does have an adult daughter who would be his legal surrogate and he is comfortable with this.  Consideration for completion of HCPOA documentation.  -  Code Status: Full Code  Prognosis: Unable to determine  # Symptom management: Patient is receiving these palliative interventions for symptom management with an intent to improve quality of life.   - Pain: Tramadol as needed.  Tylenol  also available.  - Insomnia: Trazodone as needed  - Nausea: Not currently an issue.  Zofran  ordered as needed  # Psychosocial Support:  - Sister, girlfriend Alecia  #  Discharge Planning: To Be Determined  -  Discussed with: Patient  Thank you for allowing the palliative care team to participate in the care Toribio Server.  Amaryllis Meissner, MD Palliative Care Provider PMT # [phone]  If patient remains symptomatic despite maximum doses, please call PMT at [phone] between 0700 and 1900. Outside of these hours, please call attending, as  PMT does not have night coverage.   I personally spent a total of 40 minutes in the care of the patient today including preparing to see the patient, getting/reviewing separately obtained history, performing a medically appropriate exam/evaluation, counseling and educating, and documenting clinical information in the EHR.

## 2024-04-29 NOTE — Progress Notes (Signed)
 Patient requested to minimize any and all interruptions until morning. CVP obtained. Care and assessment ongoing

## 2024-04-29 NOTE — Progress Notes (Signed)
 Patient ID: Jahkai Yandell, male   DOB: 02-Mar-1966, 58 y.o.   MRN: [id number] S: No new complaints. O:BP 106/72 (BP Location: Right Arm)   Pulse 68   Temp 98.2 F (36.8 C)   Resp 14   Ht 6' 5 (1.956 m)   Wt 99.9 kg   SpO2 96%   BMI 26.12 kg/m   Intake/Output Summary (Last 24 hours) at 04/29/2024 1513 Last data filed at 04/29/2024 1246 Gross per 24 hour  Intake 684.79 ml  Output 1775 ml  Net -1090.21 ml   Intake/Output: I/O last 3 completed shifts: In: 994.8 [P.O.:680; I.V.:214.8; IV Piggyback:100] Out: 2880 [Urine:2880]  Intake/Output this shift:  Total I/O In: 495.7 [P.O.:360; I.V.:35.7; IV Piggyback:100] Out: 575 [Urine:575] Weight change: -0.526 kg Gen: NAD CVS: RRR II/VI SEM at apex Resp: CTA Abd: +BS, soft, NT/ND Ext: trace BLE edema  Recent Labs  Lab 04/23/24 0445 04/23/24 1058 04/24/24 0500 04/25/24 0547 04/26/24 0500 04/26/24 1650 04/27/24 0535 04/28/24 0628 04/29/24 0422  NA 138   < > 140 135 139 138 137 137 138  K 3.6   < > 3.4* 3.3* 3.0* 3.1* 3.2* 3.3* 3.1*  CL 102  --  103 100 100 98 99 95* 96*  CO2 18*  --  20* 20* 25 25 26 25 27   GLUCOSE 133*  --  139* 147* 119* 101* 111* 133* 106*  BUN 70*  --  67* 63* 57* 55* 55* 49* 50*  CREATININE 4.82*  --  5.08* 5.17* 4.82* 4.88* 4.97* 4.72* 4.78*  ALBUMIN 2.9*  --   --   --   --   --   --   --   --   CALCIUM  8.9  --  9.1 8.8* 9.1 9.2 8.9 9.0 9.0   < > = values in this interval not displayed.   Liver Function Tests: Recent Labs  Lab 04/23/24 0445  ALBUMIN 2.9*   No results for input(s): LIPASE, AMYLASE in the last 168 hours. No results for input(s): AMMONIA in the last 168 hours. CBC: Recent Labs  Lab 04/25/24 0547 04/26/24 0500 04/27/24 0535 04/28/24 0628 04/28/24 2030 04/29/24 0422  WBC 7.6 6.0 6.9 6.4  --  6.6  HGB 8.2* 7.8* 8.1* 7.9* 8.8* 8.7*  HCT 26.0* 24.4* 25.7* 24.7* 28.4* 27.1*  MCV 83.3 82.7 83.4 83.2  --  82.9  PLT 331 312 320 317  --  332   Cardiac Enzymes: No  results for input(s): CKTOTAL, CKMB, CKMBINDEX, TROPONINI in the last 168 hours. CBG: Recent Labs  Lab 04/28/24 1132 04/28/24 1550 04/28/24 2058 04/29/24 0615 04/29/24 1133  GLUCAP 138* 131* 167* 101* 143*    Iron Studies: No results for input(s): IRON, TIBC, TRANSFERRIN, FERRITIN in the last 72 hours. Studies/Results: No results found.  sodium chloride    Intravenous Once   apixaban   5 mg Oral BID   buPROPion   300 mg Oral Daily   Chlorhexidine  Gluconate Cloth  6 each Topical Daily   insulin  aspart  0-9 Units Subcutaneous TID WC   midodrine  15 mg Oral TID WC   pantoprazole   40 mg Oral BID   rosuvastatin   20 mg Oral Daily   simethicone   240 mg Oral Once   sodium chloride  flush  10-40 mL Intracatheter Q12H   torsemide  40 mg Oral BID   traZODone  50 mg Oral Daily    BMET    Component Value Date/Time   NA 138 04/29/2024 0422   NA 142 12/18/2023  0926   K 3.1 (L) 04/29/2024 0422   CL 96 (L) 04/29/2024 0422   CO2 27 04/29/2024 0422   GLUCOSE 106 (H) 04/29/2024 0422   BUN 50 (H) 04/29/2024 0422   BUN 18 12/18/2023 0926   CREATININE 4.78 (H) 04/29/2024 0422   CREATININE 1.56 (H) 10/23/2023 1005   CALCIUM  9.0 04/29/2024 0422   GFRNONAA 14 (L) 04/29/2024 0422   GFRNONAA 59 (L) 01/26/2020 0906   GFRAA 69 07/13/2020 1449   GFRAA 69 01/26/2020 0906   CBC    Component Value Date/Time   WBC 6.6 04/29/2024 0422   RBC 3.27 (L) 04/29/2024 0422   HGB 8.7 (L) 04/29/2024 0422   HGB 13.0 04/01/2023 0851   HCT 27.1 (L) 04/29/2024 0422   HCT 40.4 04/01/2023 0851   PLT 332 04/29/2024 0422   PLT 363 04/01/2023 0851   MCV 82.9 04/29/2024 0422   MCV 102 (H) 04/01/2023 0851   MCH 26.6 04/29/2024 0422   MCHC 32.1 04/29/2024 0422   RDW 16.4 (H) 04/29/2024 0422   RDW 13.5 04/01/2023 0851   LYMPHSABS 2.1 11/13/2022 1100   LYMPHSABS 2.7 07/13/2020 1449   MONOABS 0.5 11/13/2022 1100   EOSABS 0.1 11/13/2022 1100   EOSABS 0.1 07/13/2020 1449   BASOSABS 0.1 11/13/2022  1100   BASOSABS 0.1 07/13/2020 1449    Itamar Mcgowan is an 58 y.o. male hypertension, dyslipidemia, T2DM, afib on Eliquis , CHF with grade third diastolic dysfunction/concentric LVH who was presented with symptomatic anemia, acute blood loss anemia seen as a consultation for the evaluation of AKI on CKD. Worsening SOB, DOE, BRBPR. Hypotensive in ED. EGD showed congested friable gastric mucosa likely the source of bleeding and colonoscopy on 10/30 showed hemorrhoids.  He was also managed for acute on chronic CHF exacerbation, EF was around 45% however grade third diastolic dysfunction.  Seen by cardiology team and placed on IV diuretics.     For CKD, patient was seen once at [facility] 2023; recent serum creatinine level fluctuating anywhere around 1.4-2.5. He has hypokalemia, noted albumin level of 2.4. No recent NSAID use and no IV contrast use. Jardiance  which is currently on hold.  He takes furosemide  40 mg at home.   Assessment/Plan: # Acute kidney injury on CKDIIIb-IV ( b/lcr 1.5-2.5): Acute kidney injury likely due to reduced renal perfusion (low effective circulatory volume) caused by persistent hypotension, severe anemia, ongoing use of diuretics for CHF.  Not on ACE inhibitor, ARB and no recent use of IV contrast. Appears to be CRS; hopefully did not develop ATN from hyperperfusion and hypotension.   -Currently on furosemide  120 mg twice daily with dobutamine started 10/31 evening.   He does have peripheral edema with low serum albumin level. Renal function somewhat better but requiring inotropic support. Plan to titrate off if tolerated o/w home dobutamine.    Dr. Cherrie d/w [facility] transplant team, performing prelim tests. Patient will be excited; he was very worried about future plans.   - Continue with strict ins and out, daily lab.  -Kidney ultrasound neg for r/o obstruction (he does c/o feeling full after voiding); will check a PVR bladder scan as well - Continue to avoid ACE inhibitor, ARB  or SGLT 2 inhibitor.   # Acute blood loss anemia due to GI bleed, symptomatic.  Seen by gastroenterologist and underwent upper and lower GI endoscopy.  Received multiple units of blood transfusion with improvement of hemoglobin.  Back on anticoagulation now. S/p resection of polyps + hemorrhoids but were not  actively bldg - Severely iron deficient -> loaded w IV iron and then after a few doses ESA -> darbo 100 ordered   # Persistent hypotension:   Not on any antihypertensives.  Agree with midodrine and rx with albumin.   # Acute on chronic CHF with EF of 45 to 50%, diastolic dysfunction: Right heart cath 10/31 showed high right sided pressures now on dobutamine resulting in much improved UOP   # Metabolic acidosis: CO2 20, trend lab.  Fairy RONAL Sellar, MD [facility]

## 2024-04-29 NOTE — Progress Notes (Signed)
 Daily Progress Note   Patient Name: Keith Leblanc       Date: 04/29/2024 DOB: [birth year]  Age: 58 y.o. MRN#: [id number] Attending Physician: Christobal Guadalajara, MD Primary Care Physician: [name], NP Admit Date: 04/17/2024 Length of Stay: 12 days  Reason for Consultation/Follow-up: Establishing goals of care  Subjective:   Subjective: Reviewed EMR with notable results of hemoglobin 7.9, potassium 3.3, creatinine 4.72 and GFR 14  I met today with Keith Leblanc.  He was open and welcoming to conversation.  He reports that he is thankful for the potential option for heart and kidney transplant, but he also finds it hard to believe that he is in this position.  He understands that his prior alcohol use is a potential concern in regard to transplant eligibility and states that he takes accountability for that but is also very much invested in whenever he needs to do in order to improve himself and no longer has a desire to drink as he has seen the negative impacts.  We began to discuss again regarding advance care planning as this will be something that we will need to be delineated prior to potential transplant.  Around this time, however, staff came in with blood transfusion and with the acute need to start this we decided to hold further conversation until tomorrow as it was too difficult to continue with necessary care being given in room.  Review of Systems Some dyspnea with exertion  Objective:   Vital Signs:  BP 102/76 (BP Location: Left Arm)   Pulse 66   Temp 98.1 F (36.7 C) (Oral)   Resp (!) 22   Ht 6' 5 (1.956 m)   Wt 99.9 kg   SpO2 99%   BMI 26.12 kg/m   Physical Exam: General: NAD, alert Eyes: conjunctiva clear, anicteric sclera HENT: normocephalic, atraumatic, moist mucous membranes Cardiovascular: RRR, 1+ edema in LE b/l Respiratory: no increased work of breathing noted, not in respiratory distress Abdomen: not distended Skin: no rashes or lesions on visible  skin Neuro: A&Ox4, following commands easily Psych: appropriately answers all questions, very pleasant  Imaging: @IMAGES @  I personally reviewed recent imaging.   Assessment & Plan:   Assessment: 58 year old male with GI bleed, heart failure, and AKI on CKD.  Recommendations/Plan: # Complex medical decision making/goals of care:  - Met today with patient to continue processing information regarding acuity of his condition.  He remains full code/full scope.  We had time to discuss a little about his hope for potential heart and lung transplant and that Dr. Cherrie is working to see if this is a possibility.  Conversation cut short by the fact that he was set to receive transfusion.  Plan for follow-up tomorrow.  -  Code Status: Full Code  Prognosis: Unable to determine  # Symptom management: Patient is receiving these palliative interventions for symptom management with an intent to improve quality of life.   - Pain: Tramadol as needed.  Tylenol  also available.  - Insomnia: Trazodone as needed  - Nausea: Not currently an issue.  Zofran  ordered as needed  # Psychosocial Support:  - Sister, girlfriend Alecia  # Discharge Planning: To Be Determined  -  Discussed with: Patient  Thank you for allowing the palliative care team to participate in the care Toribio Server.  Amaryllis Meissner, MD Palliative Care Provider PMT # [phone]  If patient remains symptomatic despite maximum doses, please call PMT at [phone] between 0700 and 1900. Outside of these hours, please call  attending, as PMT does not have night coverage.   I personally spent a total of 35 minutes in the care of the patient today including preparing to see the patient, getting/reviewing separately obtained history, performing a medically appropriate exam/evaluation, counseling and educating, and documenting clinical information in the EHR.

## 2024-04-30 DIAGNOSIS — I5043 Acute on chronic combined systolic (congestive) and diastolic (congestive) heart failure: Secondary | ICD-10-CM | POA: Diagnosis not present

## 2024-04-30 LAB — COOXEMETRY PANEL
Carboxyhemoglobin: 1.7 % — ABNORMAL HIGH (ref 0.5–1.5)
Carboxyhemoglobin: 1.6 % — ABNORMAL HIGH (ref 0.5–1.5)
Methemoglobin: <0.7 % (ref 0.0–1.5)
Methemoglobin: <0.7 % (ref 0.0–1.5)
O2 Saturation: 59.1 %
O2 Saturation: 57.6 %
Total hemoglobin: 9.3 g/dL — ABNORMAL LOW (ref 12.0–16.0)
Total hemoglobin: 9.5 g/dL — ABNORMAL LOW (ref 12.0–16.0)

## 2024-04-30 LAB — BASIC METABOLIC PANEL WITH GFR
Anion gap: 14 (ref 5–15)
BUN: 51 mg/dL — ABNORMAL HIGH (ref 6–20)
CO2: 28 mmol/L (ref 22–32)
Calcium: 9.1 mg/dL (ref 8.9–10.3)
Chloride: 96 mmol/L — ABNORMAL LOW (ref 98–111)
Creatinine, Ser: 4.74 mg/dL — ABNORMAL HIGH (ref 0.61–1.24)
GFR, Estimated: 14 mL/min — ABNORMAL LOW (ref >60)
Glucose, Bld: 104 mg/dL — ABNORMAL HIGH (ref 70–99)
Potassium: 3.3 mmol/L — ABNORMAL LOW (ref 3.5–5.1)
Sodium: 138 mmol/L (ref 135–145)

## 2024-04-30 LAB — CBC
HCT: 27.8 % — ABNORMAL LOW (ref 39.0–52.0)
Hemoglobin: 8.7 g/dL — ABNORMAL LOW (ref 13.0–17.0)
MCH: 26.5 pg (ref 26.0–34.0)
MCHC: 31.3 g/dL (ref 30.0–36.0)
MCV: 84.8 fL (ref 80.0–100.0)
Platelets: 341 K/uL (ref 150–400)
RBC: 3.28 MIL/uL — ABNORMAL LOW (ref 4.22–5.81)
RDW: 16.6 % — ABNORMAL HIGH (ref 11.5–15.5)
WBC: 6.4 K/uL (ref 4.0–10.5)
nRBC: 0 % (ref 0.0–0.2)

## 2024-04-30 LAB — GLUCOSE, CAPILLARY
Glucose-Capillary: 174 mg/dL — ABNORMAL HIGH (ref 70–99)
Glucose-Capillary: 160 mg/dL — ABNORMAL HIGH (ref 70–99)
Glucose-Capillary: 117 mg/dL — ABNORMAL HIGH (ref 70–99)
Glucose-Capillary: 147 mg/dL — ABNORMAL HIGH (ref 70–99)

## 2024-04-30 LAB — MAGNESIUM: Magnesium: 1.8 mg/dL (ref 1.7–2.4)

## 2024-04-30 MED ORDER — POTASSIUM CHLORIDE CRYS ER 20 MEQ PO TBCR
40.0000 meq | EXTENDED_RELEASE_TABLET | ORAL | Status: AC
  Administered 2024-04-30 (×3): 40 meq via ORAL
  Filled 2024-04-30 (×3): qty 2

## 2024-04-30 MED ORDER — TRAZODONE HCL 50 MG PO TABS
50.0000 mg | ORAL_TABLET | Freq: Once | ORAL | Status: DC
  Filled 2024-04-30: qty 1

## 2024-04-30 MED ORDER — MAGNESIUM SULFATE 2 GM/50ML IV SOLN
2.0000 g | Freq: Once | INTRAVENOUS | Status: AC
  Administered 2024-04-30: 2 g via INTRAVENOUS
  Filled 2024-04-30: qty 50

## 2024-04-30 MED ORDER — SENNOSIDES-DOCUSATE SODIUM 8.6-50 MG PO TABS
1.0000 | ORAL_TABLET | Freq: Two times a day (BID) | ORAL | Status: DC
  Administered 2024-04-30: 1 via ORAL
  Filled 2024-04-30 (×6): qty 1

## 2024-04-30 MED ORDER — TORSEMIDE 20 MG PO TABS: 80.0000 mg | ORAL_TABLET | Freq: Two times a day (BID) | ORAL | Status: DC

## 2024-04-30 MED ORDER — TORSEMIDE 20 MG PO TABS
60.0000 mg | ORAL_TABLET | Freq: Two times a day (BID) | ORAL | Status: DC
  Administered 2024-04-30 – 2024-05-03 (×7): 60 mg via ORAL
  Filled 2024-04-30 (×7): qty 3

## 2024-04-30 NOTE — Plan of Care (Signed)

## 2024-04-30 NOTE — TOC Progression Note (Signed)
 Transition of Care [facility]) - Progression Note    Patient Details  Name: Keith Leblanc MRN: [id number] Date of Birth: 10-Sep-1965  Transition of Care [facility]) CM/SW Contact  Arlana JINNY Nicholaus ISRAEL Phone Number: [phone] 04/30/2024, 11:24 AM  Clinical Narrative:  [facility] CSW reviewed patients chart. Patient is not medically ready for dc.   [facility] CSW/CM will continue to follow and monitor for dc readiness.                        Expected Discharge Plan and Services                                               Social Drivers of Health (SDOH) Interventions SDOH Screenings   Food Insecurity: No Food Insecurity (04/18/2024)  Housing: Low Risk  (04/18/2024)  Transportation Needs: No Transportation Needs (04/18/2024)  Utilities: Not At Risk (04/18/2024)  Depression (PHQ2-9): Low Risk  (03/23/2024)  Tobacco Use: Medium Risk (04/23/2024)    Readmission Risk Interventions    03/15/2024   12:35 PM  Readmission Risk Prevention Plan  Transportation Screening Complete  HRI or Home Care Consult Complete  Social Work Consult for Recovery Care Planning/Counseling Complete  Palliative Care Screening Not Applicable  Medication Review [employer]) Referral to Pharmacy

## 2024-04-30 NOTE — Progress Notes (Addendum)
 Patient ID: Rakan Soffer, male   DOB: 1966-06-02, 58 y.o.   MRN: [id number]     Advanced Heart Failure Rounding Note  Cardiologist: None  Chief Complaint: A/c HFmrEF, GIB  Patient Profile   58 y/o male w/ prior h/o heavy ETOH use (recently quit), HFmrEF, CKDIIIa, PAF on Eliquis , HTN, HLD and Type 2DM, admitted w/ symptomatic ABLA 2/2 GIB. HF management c/b persistent hypotension and AKI despite adequate volume resuscitation. AHF team asked to assist.   Subjective:    Co-ox 59%. CVP 11. On DBA 2. Fick CO/CI 6.3/2.7 Net even. Weight down 4 lb.  RUQ us  still pending read  Slept much better overnight. No SOB or chest pain.  Objective:    Weight Range: 97.3 kg Body mass index is 25.45 kg/m.   Vital Signs:   Temp:  [97.7 F (36.5 C)-98.2 F (36.8 C)] 97.7 F (36.5 C) (11/07 0418) Pulse Rate:  [63-76] 75 (11/07 0418) Resp:  [14-22] 20 (11/07 0418) BP: (88-106)/(52-76) 97/66 (11/07 0418) SpO2:  [96 %-100 %] 98 % (11/07 0418) Weight:  [97.3 kg-99.2 kg] 97.3 kg (11/07 0523) Last BM Date : 04/27/24  Weight change: Filed Weights   04/29/24 0604 04/30/24 0424 04/30/24 0523  Weight: 99.9 kg 99.2 kg 97.3 kg   Intake/Output:  Intake/Output Summary (Last 24 hours) at 04/30/2024 0657 Last data filed at 04/30/2024 0600 Gross per 24 hour  Intake 1747.75 ml  Output 1575 ml  Net 172.75 ml    Physical Exam   General: Well appearing. No distress  Cardiac: JVP flat. S1 and S2 present. No murmurs  Resp: Lung sounds clear and equal B/L Extremities: Warm and dry.  No peripheral edema.  Neuro: Alert and oriented x3. Affect pleasant. Moves all extremities without difficulty.  EKG   AF 50-60s (personally reviewed)  Labs    CBC Recent Labs    04/29/24 0422 04/30/24 0425  WBC 6.6 6.4  HGB 8.7* 8.7*  HCT 27.1* 27.8*  MCV 82.9 84.8  PLT 332 341   Basic Metabolic Panel Recent Labs    88/93/74 0422 04/30/24 0425  NA 138 138  K 3.1* 3.3*  CL 96* 96*  CO2 27 28  GLUCOSE  106* 104*  BUN 50* 51*  CREATININE 4.78* 4.74*  CALCIUM  9.0 9.1  MG  --  1.8   BNP (last 3 results) Recent Labs    03/24/24 1624 04/17/24 1704  BNP 1,093.8* 1,324.0*   ProBNP (last 3 results) Recent Labs    03/12/24 0942  PROBNP 5,364.0*   Medications:    Scheduled Medications:  sodium chloride    Intravenous Once   apixaban   5 mg Oral BID   buPROPion   300 mg Oral Daily   Chlorhexidine  Gluconate Cloth  6 each Topical Daily   insulin  aspart  0-9 Units Subcutaneous TID WC   midodrine  15 mg Oral TID WC   pantoprazole   40 mg Oral BID   rosuvastatin   20 mg Oral Daily   simethicone   240 mg Oral Once   sodium chloride  flush  10-40 mL Intracatheter Q12H   torsemide  40 mg Oral BID   traZODone  50 mg Oral Daily    Infusions:  DOBUTamine 2.0279 mcg/kg/min (04/30/24 0600)   iron sucrose 200 mg (04/29/24 1221)    PRN Medications: acetaminophen  **OR** acetaminophen , alum & mag hydroxide-simeth, ondansetron  **OR** ondansetron  (ZOFRAN ) IV, polyethylene glycol, sodium chloride  flush, traMADol  Assessment/Plan   1. Acute on chronic HF with mid range EF: Echo in 8/25  showed EF 45-50%, severe LVH, abnormal RV free wall strain, ~17%, RV mildly reduced.  Cardiac MRI showed moderate concentric LVH with EF 43%, RV EF 47%, ECV 36%, diffuse patchy LGE throughout LV myocardium. PYP scan negative.  He had an endomyocardial biopsy that was negative for cardiac amyloidosis. Suspect hypertrophic cardiomyopathy.  - DBA increased to 4 with persistent low output, co-ox 33>30.  - Co-ox 59% today on DBA 2. Fick CO/CI 6.3/2.7. Wean DBA to 1. - CVP 11. Increase Torsemide to 60 mg bid - GDMT limited with low output, CKD, and hypotension.  - Continue current midodrine 15 mg tid for now to maintain MAP.  - remains inotrope dependent, attempting slow wean of DBA. Dr. Cherrie discussed with [facility] transplant about referral for heart-kidney. They will see him in the outpatient. Need to determine if home DBA  is needed  2. AKI on CKD stage 3b: Renal US  negative. ?ATN in setting of hypotension from GI bleeding.   - sCr ~4.7-4.8 - nephrology now following, conducting w/u to exclude nephrotic syndrome  - Renal US  normal - Multiple Myeloma panel 9/25 negative for M-spike protein   3. Atrial fibrillation: Persistent  - Rate controlled.  - continue eliquis  5 mg bid    4. GI bleeding: Has had transfusions.  EGD with gastritis.  Colonoscopy w/ 2 polyps (resected) and non bleeding hemorrhoids. - No bleeding.  - Tsat 4%, given IV Fe.  - Hgb stable - Continue eliquis     5. ETOH abuse.  - he reports he quit September 16th. Consult TOC for alcohol cessation program.  - RUQ US  complete, pending read  6. Delirium - improving with tramadol  Length of Stay: 66  Jordan Lee, NP  04/30/2024, 6:57 AM  Patient seen and examined with the above-signed Advanced Practice Provider and/or Housestaff. I personally reviewed laboratory data, imaging studies and relevant notes. I independently examined the patient and formulated the important aspects of the plan. I have edited the note to reflect any of my changes or salient points. I have personally discussed the plan with the patient and/or family.  Remains on DBA 2. Co-ox marginal at 59%  CVP 11. Decent diuresis. Feels ok   General:  Sitting up in bed. No resp difficulty HEENT: normal Neck: supple. JVP to jaw Cor: Regular rate & rhythm. No rubs, gallops or murmurs. Lungs: clear Abdomen: soft, nontender, nondistended.Good bowel sounds. Extremities: no cyanosis, clubbing, rash, edema Neuro: alert & orientedx3, cranial nerves grossly intact. moves all 4 extremities w/o difficulty. Affect pleasant  We are weaning DBA. Co-ox marginal but ok. Will continue to wean DBA. If co-ox drops below 55%, I think bes plan is for home DBA while we arrange heart-kidney transplant eval at [facility]. Will have [facility] team prepare transplant packet to send to [facility] ()I d/w Dr. Devore  already who feels he may be a candidate). Blood type O pos. Increase diuretics today.   Toribio Cherrie, MD  8:44 AM

## 2024-04-30 NOTE — Progress Notes (Signed)
 PROGRESS NOTE    Keith Leblanc  FMW:[id number] DOB: 01/29/1966 DOA: 04/17/2024 PCP: [name], NP   Brief Narrative:   Keith Leblanc is a 58 y.o. male with PMH of of prior stroke, hypertension, diabetes who presented to the ED due to shortness of breath w/ progressive worsening over the last week and noticed bright red blood in stool , he in ED BP soft, labs showed bicarb 21, creatinine 3.1 baseline around 3, WBC 8.0, hemoglobin 7.5 baseline around 12, troponin 59, 54, INR 1.6.   CXR>>no acute changes.GI was consulted and started on IV PPI BID.  S/P 1 unit of packed RBCs for Hg 6.8. His Eliquis  was held. Last colonoscopy in 2019 which showed diverticulosis and multiple polyps.  Patient endorsed improving shortness of breath following transfusion no chest pain. Underwent EGD 10/27 has congested friable gastric mucosa likely the source of bleeding  10/30 colonoscopy showed hemorrhoids likely cause of patient's blood in the stool Patient having ongoing AKI nephro cardio following 10/31 underwent RHC, and had central line placement-patient was started on dobutamine drip 1. Elevated right-sided filling pressures out of proportion to mildly elevated PCWP.  Mild pulmonary venous hypertension Low cardiac output by thermo though preserved by Fick. Low PAPi>S uspect primarily RV dysfunction> started dobutamine 2.5 for RV support.   Assessment & Plan:   Principal Problem:   Acute on chronic combined systolic and diastolic CHF (congestive heart failure) (HCC) Active Problems:   Melena   AKI (acute kidney injury)   Acute renal failure superimposed on stage 4 chronic kidney disease (HCC)   GI bleed   Acute on chronic blood loss anemia   Hematochezia   Gastritis and gastroduodenitis   Chronic kidney disease (CKD), stage IV (severe) (HCC)   Systolic heart failure (HCC)   Benign neoplasm of transverse colon   Acute on chronic CHF with midrange EF 43% Severe LVH Abnormal RV free wall  strain ~ 17% HCM Hypotension Nonsustained V. tach: lvEF 45-50% in 01/2024, now at 43%, RV mildly reduced-previously endomyocardial biopsy negative for cardiac amyloidosis-Suspect HCM.  Receiving IV Lasix  along with midodrine due to some hypotension> creatinine started to trend up S/p RHC 10/31-with RV dysfunction (see report) s/ p central line and started dobutamine drip and high-dose Lasix , and metolazone few doses GDMT limited due to hypotension and renal failure. Remains on dobutamine drip,  torsemide 40 twice daily 11/6 Advanced heart failure team managing input appreciated>, Dr. Bensimhon discussed with [facility] transplant about referral for heart kidney transplant and they will see him in the outpatient.  RUQ ultrasound done to rule out cirrhosis, report pending    AKI on CKD stage iv Metabolic acidosis Hypokalemia: Creat b/l 1.5-2.5.likely multifactorial initially suspecting AKI on CKD due to GI bleeding, severe anemia, hypotension,RV dysfunction/CHF and need for diuretics> now appears to be CRS-hopefully did not develop ATN.  Now looking into heart and kidney transplant at [facility] Nephro following closely-monitor renal function, replace electrolytes, monitor intake output. Avoid nephrotoxic medications including NSAIDs and iodinated intravenous contrast exposure unless the latter is absolutely indicated.Preferred narcotic agents for pain control jmz:ybimnfnmeynwz, fentanyl , and methadone and avoid morphine.Avoid Baclofen and avoid oral sodium phosphate and magnesium  citrate based laxatives / bowel preps. Continue strict Input and Output monitoring and serial renal functions.   Creatinine has plateaued at around 4.7   ABLA due to upper GI bleed with friable congested gastric mucosa Hematochezia due to internal hemorrhoids:  history of LA grade a reflux esophagitis and gastritis seen on EGD in  2019. S/p EGD 10/27: congested friable gastric mucosa likely the source of bleeding.  Colonoscopy  10/30-internal hemorrhoids likely cause of hematochezia doubt it accounts for patient's drop in hemoglobin. So far received 3 units PRBC- hb holding >8 gm, S/P IV iron Continue to monitor hemoglobin. Eliquis  resumed 11/1 after GI clearance, cont ppi     Chronic depression Insomnia/delirium overnight: Patient having sleep disorder delirium overnight, placed on trazodone as needed.  Continue home bupropion .     Elevated troponin: Due to demand ischemia.  Plan per cardiology   Hyperlipidemia: continue  crestor    Persistent A-fib: Eliquis  resumed 11/1.Rate is stable monitor in telemetry   DVT prophylaxis: Place TED hose Start: 04/21/24 1237 SCDs Start: 04/17/24 2333 Code Status:   Code Status: Full Code Family Communication: plan of care discussed with patient   Patient status is: Remains hospitalized because of severity of illness Level of care: Progressive transfer to telemetry   Dispo: The patient is from: HOME alone, not married.            Anticipated disposition: Home after cardiac clearance 1-2 days   Consultants: Cardiology, nephrology, GI  Procedures:   Antimicrobials:    Subjective:  Patient seen and examined at the bedside.  Remains on dobutamine drip.  Denies shortness of breath or chest pain, not on any supplemental oxygen.  Vital signs are stable.  Objective: Vitals:   04/30/24 0523 04/30/24 0810 04/30/24 1125 04/30/24 1200  BP:  95/69 98/70 94/68   Pulse:  72 68 68  Resp:  18 18 14   Temp:  97.7 F (36.5 C) 97.7 F (36.5 C) 98 F (36.7 C)  TempSrc:  Oral Oral Oral  SpO2:  96% 98% 97%  Weight: 97.3 kg     Height:        Intake/Output Summary (Last 24 hours) at 04/30/2024 1406 Last data filed at 04/30/2024 9078 Gross per 24 hour  Intake 1262.02 ml  Output 1000 ml  Net 262.02 ml   Filed Weights   04/29/24 0604 04/30/24 0424 04/30/24 0523  Weight: 99.9 kg 99.2 kg 97.3 kg    Examination:  General exam: Appears calm and comfortable  Respiratory  system: Bilateral decreased breath sounds at bases Cardiovascular system: S1 & S2 heard, Rate controlled Gastrointestinal system: Abdomen is nondistended, soft and nontender. Normal bowel sounds heard. Extremities: No cyanosis, clubbing, edema  Central nervous system: Alert and oriented. No focal neurological deficits. Moving extremities Skin: No rashes, lesions or ulcers Psychiatry: Judgement and insight appear normal. Mood & affect appropriate.     Data Reviewed: I have personally reviewed following labs and imaging studies  CBC: Recent Labs  Lab 04/26/24 0500 04/27/24 0535 04/28/24 0628 04/28/24 2030 04/29/24 0422 04/30/24 0425  WBC 6.0 6.9 6.4  --  6.6 6.4  HGB 7.8* 8.1* 7.9* 8.8* 8.7* 8.7*  HCT 24.4* 25.7* 24.7* 28.4* 27.1* 27.8*  MCV 82.7 83.4 83.2  --  82.9 84.8  PLT 312 320 317  --  332 341   Basic Metabolic Panel: Recent Labs  Lab 04/24/24 0500 04/25/24 0547 04/25/24 0900 04/26/24 0500 04/26/24 1650 04/27/24 0535 04/28/24 0628 04/29/24 0422 04/30/24 0425  NA 140   < >  --  139 138 137 137 138 138  K 3.4*   < >  --  3.0* 3.1* 3.2* 3.3* 3.1* 3.3*  CL 103   < >  --  100 98 99 95* 96* 96*  CO2 20*   < >  --  25 25  26 25 27 28   GLUCOSE 139*   < >  --  119* 101* 111* 133* 106* 104*  BUN 67*   < >  --  57* 55* 55* 49* 50* 51*  CREATININE 5.08*   < >  --  4.82* 4.88* 4.97* 4.72* 4.78* 4.74*  CALCIUM  9.1   < >  --  9.1 9.2 8.9 9.0 9.0 9.1  MG 2.0  --  1.8 2.0  --   --   --   --  1.8   < > = values in this interval not displayed.   GFR: Estimated Creatinine Clearance: 21.9 mL/min (A) (by C-G formula based on SCr of 4.74 mg/dL (H)). Liver Function Tests: No results for input(s): AST, ALT, ALKPHOS, BILITOT, PROT, ALBUMIN in the last 168 hours. No results for input(s): LIPASE, AMYLASE in the last 168 hours. No results for input(s): AMMONIA in the last 168 hours. Coagulation Profile: No results for input(s): INR, PROTIME in the last 168  hours. Cardiac Enzymes: No results for input(s): CKTOTAL, CKMB, CKMBINDEX, TROPONINI in the last 168 hours. BNP (last 3 results) Recent Labs    03/12/24 0942  PROBNP 5,364.0*   HbA1C: No results for input(s): HGBA1C in the last 72 hours. CBG: Recent Labs  Lab 04/29/24 1133 04/29/24 1600 04/29/24 2122 04/30/24 0606 04/30/24 1124  GLUCAP 143* 139* 120* 174* 160*   Lipid Profile: No results for input(s): CHOL, HDL, LDLCALC, TRIG, CHOLHDL, LDLDIRECT in the last 72 hours. Thyroid  Function Tests: No results for input(s): TSH, T4TOTAL, FREET4, T3FREE, THYROIDAB in the last 72 hours. Anemia Panel: No results for input(s): VITAMINB12, FOLATE, FERRITIN, TIBC, IRON, RETICCTPCT in the last 72 hours. Sepsis Labs: Recent Labs  Lab 04/27/24 9177  LATICACIDVEN 1.4    No results found for this or any previous visit (from the past 240 hours).       Radiology Studies: No results found.      Scheduled Meds:  sodium chloride    Intravenous Once   apixaban   5 mg Oral BID   buPROPion   300 mg Oral Daily   Chlorhexidine  Gluconate Cloth  6 each Topical Daily   insulin  aspart  0-9 Units Subcutaneous TID WC   midodrine  15 mg Oral TID WC   pantoprazole   40 mg Oral BID   potassium chloride   40 mEq Oral Q3H   rosuvastatin   20 mg Oral Daily   simethicone   240 mg Oral Once   sodium chloride  flush  10-40 mL Intracatheter Q12H   torsemide  60 mg Oral BID   traZODone  50 mg Oral Daily   Continuous Infusions:  DOBUTamine 1 mcg/kg/min (04/30/24 0758)          Artasia Thang, MD [facility] 04/30/2024, 2:06 PM

## 2024-04-30 NOTE — Progress Notes (Signed)
 Admit: 04/17/2024 LOS: 13  29M AKI on CKD4 with ABLA, AoCk HFrEF  Subjective:  Fiance at bedside, updated  Remains on dobutamine 1.6L UOP in past 24h, was slightly positive Torsemide 80 PO BID SCr unchanged from yesterday, K 3.3 on PO KCl, HCO3 28 Hb stable in 8s  11/06 0701 - 11/07 0700 In: 1747.8 [P.O.:1557; I.V.:90.8; IV Piggyback:100] Out: 1575 [Urine:1575]  Filed Weights   04/29/24 0604 04/30/24 0424 04/30/24 0523  Weight: 99.9 kg 99.2 kg 97.3 kg    Scheduled Meds:  sodium chloride    Intravenous Once   apixaban   5 mg Oral BID   buPROPion   300 mg Oral Daily   Chlorhexidine  Gluconate Cloth  6 each Topical Daily   insulin  aspart  0-9 Units Subcutaneous TID WC   midodrine  15 mg Oral TID WC   pantoprazole   40 mg Oral BID   potassium chloride   40 mEq Oral Q3H   rosuvastatin   20 mg Oral Daily   simethicone   240 mg Oral Once   sodium chloride  flush  10-40 mL Intracatheter Q12H   torsemide  80 mg Oral BID   traZODone  50 mg Oral Daily   Continuous Infusions:  DOBUTamine 2.0279 mcg/kg/min (04/30/24 0600)   iron sucrose 200 mg (04/29/24 1221)   magnesium  sulfate bolus IVPB     PRN Meds:.acetaminophen  **OR** acetaminophen , alum & mag hydroxide-simeth, ondansetron  **OR** ondansetron  (ZOFRAN ) IV, polyethylene glycol, sodium chloride  flush, traMADol  Current Labs: reviewed   Physical Exam:  Blood pressure 97/66, pulse 75, temperature 97.7 F (36.5 C), temperature source Oral, resp. rate 20, height 6' 5 (1.956 m), weight 97.3 kg, SpO2 98%. NAD, in bed Bradycardic, regular CTAB No sig LEE S/nt  A AKI on CKD4 likely from cardiorenal syndrome AoC  HFmrEF on dobutamine Subnephrotic proteinuria UPC 1.4 04/22/24; K:L SFLC 0.14 ABLA s/p GIB s/p EGD and CSY; stable, back on AC Hypotesion on midodrine Hypokalemia on repletion while on diuretics Anemia, Hb stable  P Stable GFR Diuretics already adjusted by cardiology Will need close nephrology f/u at discharge in  coordination with AHF Daily weights, Daily Renal Panel, Strict I/Os, Avoid nephrotoxins (NSAIDs, judicious IV Contrast)  Medication Issues; Preferred narcotic agents for pain control are hydromorphone, fentanyl , and methadone. Morphine should not be used.  Baclofen should be avoided Avoid oral sodium phosphate and magnesium  citrate based laxatives / bowel preps    Bernardino Gasman MD 04/30/2024, 7:48 AM  Recent Labs  Lab 04/28/24 0628 04/29/24 0422 04/30/24 0425  NA 137 138 138  K 3.3* 3.1* 3.3*  CL 95* 96* 96*  CO2 25 27 28   GLUCOSE 133* 106* 104*  BUN 49* 50* 51*  CREATININE 4.72* 4.78* 4.74*  CALCIUM  9.0 9.0 9.1   Recent Labs  Lab 04/28/24 0628 04/28/24 2030 04/29/24 0422 04/30/24 0425  WBC 6.4  --  6.6 6.4  HGB 7.9* 8.8* 8.7* 8.7*  HCT 24.7* 28.4* 27.1* 27.8*  MCV 83.2  --  82.9 84.8  PLT 317  --  332 341

## 2024-05-01 DIAGNOSIS — I5043 Acute on chronic combined systolic (congestive) and diastolic (congestive) heart failure: Secondary | ICD-10-CM | POA: Diagnosis not present

## 2024-05-01 LAB — BASIC METABOLIC PANEL WITH GFR
Anion gap: 15 (ref 5–15)
BUN: 49 mg/dL — ABNORMAL HIGH (ref 6–20)
CO2: 27 mmol/L (ref 22–32)
Calcium: 9.2 mg/dL (ref 8.9–10.3)
Chloride: 96 mmol/L — ABNORMAL LOW (ref 98–111)
Creatinine, Ser: 4.69 mg/dL — ABNORMAL HIGH (ref 0.61–1.24)
GFR, Estimated: 14 mL/min — ABNORMAL LOW (ref >60)
Glucose, Bld: 96 mg/dL (ref 70–99)
Potassium: 3.3 mmol/L — ABNORMAL LOW (ref 3.5–5.1)
Sodium: 138 mmol/L (ref 135–145)

## 2024-05-01 LAB — CBC WITH DIFFERENTIAL/PLATELET
Abs Immature Granulocytes: 0.01 K/uL (ref 0.00–0.07)
Basophils Absolute: 0.1 K/uL (ref 0.0–0.1)
Basophils Relative: 1 %
Eosinophils Absolute: 0.1 K/uL (ref 0.0–0.5)
Eosinophils Relative: 2 %
HCT: 28.6 % — ABNORMAL LOW (ref 39.0–52.0)
Hemoglobin: 9.1 g/dL — ABNORMAL LOW (ref 13.0–17.0)
Immature Granulocytes: 0 %
Lymphocytes Relative: 26 %
Lymphs Abs: 1.6 K/uL (ref 0.7–4.0)
MCH: 26.8 pg (ref 26.0–34.0)
MCHC: 31.8 g/dL (ref 30.0–36.0)
MCV: 84.4 fL (ref 80.0–100.0)
Monocytes Absolute: 0.5 K/uL (ref 0.1–1.0)
Monocytes Relative: 8 %
Neutro Abs: 4.1 K/uL (ref 1.7–7.7)
Neutrophils Relative %: 63 %
Platelets: 336 K/uL (ref 150–400)
RBC: 3.39 MIL/uL — ABNORMAL LOW (ref 4.22–5.81)
RDW: 17.1 % — ABNORMAL HIGH (ref 11.5–15.5)
WBC: 6.4 K/uL (ref 4.0–10.5)
nRBC: 0 % (ref 0.0–0.2)

## 2024-05-01 LAB — GLUCOSE, CAPILLARY
Glucose-Capillary: 108 mg/dL — ABNORMAL HIGH (ref 70–99)
Glucose-Capillary: 129 mg/dL — ABNORMAL HIGH (ref 70–99)
Glucose-Capillary: 136 mg/dL — ABNORMAL HIGH (ref 70–99)
Glucose-Capillary: 102 mg/dL — ABNORMAL HIGH (ref 70–99)

## 2024-05-01 LAB — COOXEMETRY PANEL
Carboxyhemoglobin: 1.5 % (ref 0.5–1.5)
Methemoglobin: <0.7 % (ref 0.0–1.5)
O2 Saturation: 75.6 %
Total hemoglobin: 9.6 g/dL — ABNORMAL LOW (ref 12.0–16.0)

## 2024-05-01 MED ORDER — TRAZODONE HCL 100 MG PO TABS
100.0000 mg | ORAL_TABLET | Freq: Every evening | ORAL | Status: DC | PRN
  Administered 2024-05-02: 100 mg via ORAL
  Filled 2024-05-01: qty 1

## 2024-05-01 MED ORDER — TRAZODONE HCL 50 MG PO TABS: 50.0000 mg | ORAL_TABLET | Freq: Every evening | ORAL | Status: DC | PRN

## 2024-05-01 MED ORDER — DARBEPOETIN ALFA 60 MCG/0.3ML IJ SOSY: 60.0000 ug | PREFILLED_SYRINGE | INTRAMUSCULAR | Status: DC

## 2024-05-01 MED ORDER — POTASSIUM CHLORIDE CRYS ER 20 MEQ PO TBCR
40.0000 meq | EXTENDED_RELEASE_TABLET | Freq: Two times a day (BID) | ORAL | Status: AC
  Administered 2024-05-01 (×2): 40 meq via ORAL
  Filled 2024-05-01 (×2): qty 2

## 2024-05-01 NOTE — Progress Notes (Signed)
 Patient ID: Keith Leblanc, male   DOB: 29-Jul-1965, 58 y.o.   MRN: [id number]     Advanced Heart Failure Rounding Note  Cardiologist: None  Chief Complaint: A/c HFmrEF, GIB  Patient Profile   58 y/o male w/ prior h/o heavy ETOH use (recently quit), HFmrEF, CKDIIIa, PAF on Eliquis , HTN, HLD and Type 2DM, admitted w/ symptomatic ABLA 2/2 GIB. HF management c/b persistent hypotension and AKI despite adequate volume resuscitation. AHF team asked to assist.   Subjective:    Co-ox 76%. CVP 8-9. On DBA 1. Creatinine 4.74 => 4.69.  I/Os mildly negative.   No dyspnea, walking in halls.   Objective:    Weight Range: 100.2 kg Body mass index is 26.19 kg/m.   Vital Signs:   Temp:  [97.8 F (36.6 C)-98.6 F (37 C)] 98.2 F (36.8 C) (11/08 1145) Pulse Rate:  [65-70] 65 (11/08 1145) Resp:  [14-20] 20 (11/08 1145) BP: (86-103)/(62-74) 86/65 (11/08 1145) SpO2:  [96 %-100 %] 97 % (11/08 1145) Weight:  [100.2 kg] 100.2 kg (11/08 0520) Last BM Date : 05/01/24  Weight change: Filed Weights   04/30/24 0424 04/30/24 0523 05/01/24 0520  Weight: 99.2 kg 97.3 kg 100.2 kg   Intake/Output:  Intake/Output Summary (Last 24 hours) at 05/01/2024 1350 Last data filed at 05/01/2024 1342 Gross per 24 hour  Intake 476.32 ml  Output 2450 ml  Net -1973.68 ml    Physical Exam   General: NAD Neck: JVP 8 cm, no thyromegaly or thyroid  nodule.  Lungs: Clear to auscultation bilaterally with normal respiratory effort. CV: Nondisplaced PMI.  Heart irregular S1/S2, no S3/S4, no murmur.  No peripheral edema.   Abdomen: Soft, nontender, no hepatosplenomegaly, no distention.  Skin: Intact without lesions or rashes.  Neurologic: Alert and oriented x 3.  Psych: Normal affect. Extremities: No clubbing or cyanosis.  HEENT: Normal.   EKG   AF 50-60s (personally reviewed)  Labs    CBC Recent Labs    04/30/24 0425 05/01/24 0440  WBC 6.4 6.4  NEUTROABS  --  4.1  HGB 8.7* 9.1*  HCT 27.8* 28.6*  MCV  84.8 84.4  PLT 341 336   Basic Metabolic Panel Recent Labs    88/92/74 0425 05/01/24 0440  NA 138 138  K 3.3* 3.3*  CL 96* 96*  CO2 28 27  GLUCOSE 104* 96  BUN 51* 49*  CREATININE 4.74* 4.69*  CALCIUM  9.1 9.2  MG 1.8  --    BNP (last 3 results) Recent Labs    03/24/24 1624 04/17/24 1704  BNP 1,093.8* 1,324.0*   ProBNP (last 3 results) Recent Labs    03/12/24 0942  PROBNP 5,364.0*   Medications:    Scheduled Medications:  sodium chloride    Intravenous Once   apixaban   5 mg Oral BID   buPROPion   300 mg Oral Daily   Chlorhexidine  Gluconate Cloth  6 each Topical Daily   insulin  aspart  0-9 Units Subcutaneous TID WC   midodrine  15 mg Oral TID WC   pantoprazole   40 mg Oral BID   potassium chloride   40 mEq Oral BID   rosuvastatin   20 mg Oral Daily   senna-docusate  1 tablet Oral BID   simethicone   240 mg Oral Once   sodium chloride  flush  10-40 mL Intracatheter Q12H   torsemide  60 mg Oral BID   traZODone  50 mg Oral Once    Infusions:    PRN Medications: acetaminophen  **OR** acetaminophen , alum & mag hydroxide-simeth,  ondansetron  **OR** ondansetron  (ZOFRAN ) IV, polyethylene glycol, sodium chloride  flush, traMADol, traZODone  Assessment/Plan   1. Acute on chronic HF with mid range EF: Echo in 8/25 showed EF 45-50%, severe LVH, abnormal RV free wall strain, ~17%, RV mildly reduced.  Cardiac MRI showed moderate concentric LVH with EF 43%, RV EF 47%, ECV 36%, diffuse patchy LGE throughout LV myocardium. PYP scan negative.  He had an endomyocardial biopsy that was negative for cardiac amyloidosis. Suspect hypertrophic cardiomyopathy.  - DBA increased to 4 with persistent low output, co-ox 33>30.  - Now weaning dobutamine.  - Co-ox 76% today on DBA 1. Stop dobutamine today.  If co-ox drops below 55%, I think the plan is for home DBA while we arrange heart-kidney transplant eval at [facility]. Will have VAD team prepare transplant packet to send to [facility] (Dr. Cherrie  spoke with Dr. Devore already who feels he may be a candidate). Blood type O pos.  - CVP 8-9, continue torsemide 60 mg bid.  Creatinine stable.  - GDMT limited with low output, CKD, and hypotension.  - Continue current midodrine 15 mg tid for now to maintain MAP.   2. AKI on CKD stage 3b: Renal US  negative. ?ATN in setting of hypotension from GI bleeding.   - Creatinine stable today at 4.69.  - nephrology now following, conducting w/u to exclude nephrotic syndrome  - Renal US  normal - Multiple Myeloma panel 9/25 negative for M-spike protein   3. Atrial fibrillation: Persistent  - Rate controlled.  - continue eliquis  5 mg bid    4. GI bleeding: Has had transfusions.  EGD with gastritis.  Colonoscopy w/ 2 polyps (resected) and non bleeding hemorrhoids. - No bleeding.  - Tsat 4%, given IV Fe.  - Hgb stable - Continue eliquis     5. ETOH abuse.  - he reports he quit September 16th. Consult TOC for alcohol cessation program.  - RUQ US  complete to assess liver parenchyma, still pending read  6. Delirium - improving with tramadol  Length of Stay: 14  Ezra Shuck, MD  05/01/2024, 1:50 PM

## 2024-05-01 NOTE — Plan of Care (Signed)
   Problem: Education: Goal: Knowledge of General Education information will improve Description Including pain rating scale, medication(s)/side effects and non-pharmacologic comfort measures Outcome: Progressing

## 2024-05-01 NOTE — Progress Notes (Signed)
 PROGRESS NOTE    Keith Leblanc  FMW:[id number] DOB: Mar 09, 1966 DOA: 04/17/2024 PCP: [name], NP   Brief Narrative:   Keith Leblanc is a 58 y.o. male with PMH of of prior stroke, hypertension, diabetes who presented to the ED due to shortness of breath w/ progressive worsening over the past week associated with bright red blood in his stool.   He was found to have a low hemoglobin of 7.5 with baseline around 12.  Troponin elevated at 59 and flat.  Blood pressure was soft in the ER.  AKI with subsequent worsening of creatinine despite fluids. Hemoglobin dropped further down to 6.8 for which he received blood transfusion.  He underwent EGD on 10/27 revealed congested friable gastric mucosa likely the source of GI bleeding.  Underwent colonoscopy on 10/30, showed hemorrhoid likely cause of patient's blood in his stool.  Patient also noted to have AKI with creatinine 3.1 on presentation.   Ongoing AKI with creatinine of 4.69, nephrology following Cardiology following for biventricular failure, patient underwent RHC on 10/31 and has been started on dobutamine drip.  Looking at outpatient evaluation at [facility] for kidney/heart transplant.  Assessment & Plan:   Principal Problem:   Acute on chronic combined systolic and diastolic CHF (congestive heart failure) (HCC) Active Problems:   Melena   AKI (acute kidney injury)   Acute renal failure superimposed on stage 4 chronic kidney disease (HCC)   GI bleed   Acute on chronic blood loss anemia   Hematochezia   Gastritis and gastroduodenitis   Chronic kidney disease (CKD), stage IV (severe) (HCC)   Systolic heart failure (HCC)   Benign neoplasm of transverse colon   Acute on chronic CHF with midrange EF 43% Severe LVH Abnormal RV free wall strain ~ 17% HCM Hypotension Nonsustained V. tach: lvEF 45-50% in 01/2024, now at 43%, RV mildly reduced-previously endomyocardial biopsy negative for cardiac amyloidosis-Suspect HCM.  Receiving IV  Lasix  along with midodrine due to some hypotension> creatinine started to trend up S/p RHC 10/31-with RV dysfunction (see report) s/ p central line and started dobutamine drip and high-dose Lasix , and metolazone few doses GDMT limited due to hypotension and renal failure. Remains on dobutamine drip,  torsemide 40 twice daily 11/6 Advanced heart failure team managing input appreciated>, Dr. Bensimhon discussed with [facility] transplant about referral for heart kidney transplant and they will see him in the outpatient.  RUQ ultrasound done to rule out cirrhosis, report pending    AKI on CKD stage iv Metabolic acidosis Hypokalemia: Creat b/l 1.5-2.5.likely multifactorial initially suspecting AKI on CKD due to GI bleeding, severe anemia, hypotension,RV dysfunction/CHF and need for diuretics> now appears to be CRS-hopefully did not develop ATN.  Now looking into heart and kidney transplant at [facility] Nephro following closely-monitor renal function, replace electrolytes, monitor intake output. Avoid nephrotoxic medications including NSAIDs and iodinated intravenous contrast exposure unless the latter is absolutely indicated.Preferred narcotic agents for pain control jmz:ybimnfnmeynwz, fentanyl , and methadone and avoid morphine.Avoid Baclofen and avoid oral sodium phosphate and magnesium  citrate based laxatives / bowel preps. Continue strict Input and Output monitoring and serial renal functions.   Creatinine has plateaued at around 4.7   ABLA due to upper GI bleed with friable congested gastric mucosa Hematochezia due to internal hemorrhoids:  history of LA grade a reflux esophagitis and gastritis seen on EGD in 2019. S/p EGD 10/27: congested friable gastric mucosa likely the source of bleeding.  Colonoscopy 10/30-internal hemorrhoids likely cause of hematochezia doubt it accounts for patient's drop  in hemoglobin. So far received 3 units PRBC- hb holding >8 gm, S/P IV iron Continue to monitor hemoglobin.  Eliquis  resumed 11/1 after GI clearance, cont ppi     Chronic depression Insomnia/delirium overnight: Patient having sleep disorder delirium overnight, placed on trazodone as needed.  Continue home bupropion .   Increase trazodone to 100 mg nightly as needed   Elevated troponin: Due to demand ischemia.  Plan per cardiology   Hyperlipidemia: continue  crestor    Persistent A-fib: Eliquis  resumed 11/1.Rate is stable monitor in telemetry   DVT prophylaxis: Place TED hose Start: 04/21/24 1237 SCDs Start: 04/17/24 2333 Code Status:   Code Status: Full Code Family Communication: plan of care discussed with patient   Patient status is: Remains hospitalized because of severity of illness Level of care: Progressive transfer to telemetry   Dispo: The patient is from: HOME alone, not married.            Anticipated disposition: Home after cardiac clearance 1-2 days   Consultants: Cardiology, nephrology, GI  Procedures:   Antimicrobials:    Subjective:  Patient seen and examined at the bedside.  Remains on dobutamine drip.  Denies shortness of breath or chest pain, not on any supplemental oxygen.  Vital signs are stable.  Objective: Vitals:   05/01/24 0024 05/01/24 0449 05/01/24 0520 05/01/24 0735  BP: 97/63 103/70  99/65  Pulse:    70  Resp: 19 15  19   Temp: 97.8 F (36.6 C) 98.2 F (36.8 C)  97.9 F (36.6 C)  TempSrc: Oral Oral  Oral  SpO2: 100% 99%  96%  Weight:   100.2 kg   Height:        Intake/Output Summary (Last 24 hours) at 05/01/2024 0743 Last data filed at 05/01/2024 0743 Gross per 24 hour  Intake 386.32 ml  Output 2100 ml  Net -1713.68 ml   Filed Weights   04/30/24 0424 04/30/24 0523 05/01/24 0520  Weight: 99.2 kg 97.3 kg 100.2 kg    Examination:  General exam: Appears calm and comfortable  Respiratory system: Bilateral decreased breath sounds at bases Cardiovascular system: S1 & S2 heard, Rate controlled Gastrointestinal system: Abdomen is  nondistended, soft and nontender. Normal bowel sounds heard. Extremities: No cyanosis, clubbing, edema  Central nervous system: Alert and oriented. No focal neurological deficits. Moving extremities Skin: No rashes, lesions or ulcers Psychiatry: Judgement and insight appear normal. Mood & affect appropriate.     Data Reviewed: I have personally reviewed following labs and imaging studies  CBC: Recent Labs  Lab 04/27/24 0535 04/28/24 0628 04/28/24 2030 04/29/24 0422 04/30/24 0425 05/01/24 0440  WBC 6.9 6.4  --  6.6 6.4 6.4  NEUTROABS  --   --   --   --   --  4.1  HGB 8.1* 7.9* 8.8* 8.7* 8.7* 9.1*  HCT 25.7* 24.7* 28.4* 27.1* 27.8* 28.6*  MCV 83.4 83.2  --  82.9 84.8 84.4  PLT 320 317  --  332 341 336   Basic Metabolic Panel: Recent Labs  Lab 04/25/24 0900 04/26/24 0500 04/26/24 1650 04/27/24 0535 04/28/24 0628 04/29/24 0422 04/30/24 0425 05/01/24 0440  NA  --  139   < > 137 137 138 138 138  K  --  3.0*   < > 3.2* 3.3* 3.1* 3.3* 3.3*  CL  --  100   < > 99 95* 96* 96* 96*  CO2  --  25   < > 26 25 27 28 27   GLUCOSE  --  119*   < > 111* 133* 106* 104* 96  BUN  --  57*   < > 55* 49* 50* 51* 49*  CREATININE  --  4.82*   < > 4.97* 4.72* 4.78* 4.74* 4.69*  CALCIUM   --  9.1   < > 8.9 9.0 9.0 9.1 9.2  MG 1.8 2.0  --   --   --   --  1.8  --    < > = values in this interval not displayed.   GFR: Estimated Creatinine Clearance: 22.2 mL/min (A) (by C-G formula based on SCr of 4.69 mg/dL (H)). Liver Function Tests: No results for input(s): AST, ALT, ALKPHOS, BILITOT, PROT, ALBUMIN in the last 168 hours. No results for input(s): LIPASE, AMYLASE in the last 168 hours. No results for input(s): AMMONIA in the last 168 hours. Coagulation Profile: No results for input(s): INR, PROTIME in the last 168 hours. Cardiac Enzymes: No results for input(s): CKTOTAL, CKMB, CKMBINDEX, TROPONINI in the last 168 hours. BNP (last 3 results) Recent Labs     03/12/24 0942  PROBNP 5,364.0*   HbA1C: No results for input(s): HGBA1C in the last 72 hours. CBG: Recent Labs  Lab 04/30/24 0606 04/30/24 1124 04/30/24 1544 04/30/24 2052 05/01/24 0610  GLUCAP 174* 160* 117* 147* 108*   Lipid Profile: No results for input(s): CHOL, HDL, LDLCALC, TRIG, CHOLHDL, LDLDIRECT in the last 72 hours. Thyroid  Function Tests: No results for input(s): TSH, T4TOTAL, FREET4, T3FREE, THYROIDAB in the last 72 hours. Anemia Panel: No results for input(s): VITAMINB12, FOLATE, FERRITIN, TIBC, IRON, RETICCTPCT in the last 72 hours. Sepsis Labs: Recent Labs  Lab 04/27/24 9177  LATICACIDVEN 1.4    No results found for this or any previous visit (from the past 240 hours).       Radiology Studies: No results found.      Scheduled Meds:  sodium chloride    Intravenous Once   apixaban   5 mg Oral BID   buPROPion   300 mg Oral Daily   Chlorhexidine  Gluconate Cloth  6 each Topical Daily   insulin  aspart  0-9 Units Subcutaneous TID WC   midodrine  15 mg Oral TID WC   pantoprazole   40 mg Oral BID   rosuvastatin   20 mg Oral Daily   senna-docusate  1 tablet Oral BID   simethicone   240 mg Oral Once   sodium chloride  flush  10-40 mL Intracatheter Q12H   torsemide  60 mg Oral BID   traZODone  50 mg Oral Daily   traZODone  50 mg Oral Once   Continuous Infusions:  DOBUTamine 1 mcg/kg/min (05/01/24 0457)          Derryl Duval, MD [facility] 05/01/2024, 7:43 AM

## 2024-05-01 NOTE — Plan of Care (Signed)
  Problem: Education: Goal: Knowledge of General Education information will improve Description: Including pain rating scale, medication(s)/side effects and non-pharmacologic comfort measures 05/01/2024 1900 by Myrna Hughie BIRCH, RN Outcome: Progressing 05/01/2024 1900 by Myrna Hughie BIRCH, RN Outcome: Progressing   Problem: Health Behavior/Discharge Planning: Goal: Ability to manage health-related needs will improve Outcome: Progressing   Problem: Clinical Measurements: Goal: Ability to maintain clinical measurements within normal limits will improve Outcome: Progressing Goal: Will remain free from infection Outcome: Progressing Goal: Diagnostic test results will improve Outcome: Progressing Goal: Respiratory complications will improve Outcome: Progressing Goal: Cardiovascular complication will be avoided Outcome: Progressing   Problem: Activity: Goal: Risk for activity intolerance will decrease Outcome: Progressing   Problem: Nutrition: Goal: Adequate nutrition will be maintained Outcome: Progressing   Problem: Coping: Goal: Level of anxiety will decrease Outcome: Progressing   Problem: Elimination: Goal: Will not experience complications related to bowel motility Outcome: Progressing Goal: Will not experience complications related to urinary retention Outcome: Progressing   Problem: Pain Managment: Goal: General experience of comfort will improve and/or be controlled Outcome: Progressing   Problem: Safety: Goal: Ability to remain free from injury will improve Outcome: Progressing   Problem: Skin Integrity: Goal: Risk for impaired skin integrity will decrease Outcome: Progressing   Problem: Education: Goal: Ability to describe self-care measures that may prevent or decrease complications (Diabetes Survival Skills Education) will improve Outcome: Progressing Goal: Individualized Educational Video(s) Outcome: Progressing   Problem: Coping: Goal: Ability to  adjust to condition or change in health will improve Outcome: Progressing   Problem: Fluid Volume: Goal: Ability to maintain a balanced intake and output will improve Outcome: Progressing   Problem: Health Behavior/Discharge Planning: Goal: Ability to identify and utilize available resources and services will improve Outcome: Progressing Goal: Ability to manage health-related needs will improve Outcome: Progressing   Problem: Metabolic: Goal: Ability to maintain appropriate glucose levels will improve Outcome: Progressing   Problem: Nutritional: Goal: Maintenance of adequate nutrition will improve Outcome: Progressing Goal: Progress toward achieving an optimal weight will improve Outcome: Progressing   Problem: Skin Integrity: Goal: Risk for impaired skin integrity will decrease Outcome: Progressing   Problem: Tissue Perfusion: Goal: Adequacy of tissue perfusion will improve Outcome: Progressing

## 2024-05-01 NOTE — Progress Notes (Signed)
 Admit: 04/17/2024 LOS: 14  66M AKI on CKD4 with ABLA, AoCk HFrEF  Subjective:  He had 1.7 liters UOP over 11/7.  He has been on dobutamine.  He states that they are trying to wean this.  He is an excellent historian.   Review of systems:  Denies shortness of breath or chest pain  Denies n/v    11/07 0701 - 11/08 0700 In: 386.3 [P.O.:360; I.V.:26.3] Out: 1700 [Urine:1700]  Filed Weights   04/30/24 0424 04/30/24 0523 05/01/24 0520  Weight: 99.2 kg 97.3 kg 100.2 kg    Scheduled Meds:  sodium chloride    Intravenous Once   apixaban   5 mg Oral BID   buPROPion   300 mg Oral Daily   Chlorhexidine  Gluconate Cloth  6 each Topical Daily   insulin  aspart  0-9 Units Subcutaneous TID WC   midodrine  15 mg Oral TID WC   pantoprazole   40 mg Oral BID   potassium chloride   40 mEq Oral BID   rosuvastatin   20 mg Oral Daily   senna-docusate  1 tablet Oral BID   simethicone   240 mg Oral Once   sodium chloride  flush  10-40 mL Intracatheter Q12H   torsemide  60 mg Oral BID   traZODone  50 mg Oral Once   Continuous Infusions:  DOBUTamine 1 mcg/kg/min (05/01/24 0457)   PRN Meds:.acetaminophen  **OR** acetaminophen , alum & mag hydroxide-simeth, ondansetron  **OR** ondansetron  (ZOFRAN ) IV, polyethylene glycol, sodium chloride  flush, traMADol, traZODone  Current Labs: reviewed   Physical Exam:  Blood pressure (!) 86/65, pulse 65, temperature 98.2 F (36.8 C), temperature source Oral, resp. rate 20, height 6' 5 (1.956 m), weight 100.2 kg, SpO2 97%.  General adult male in bed in no acute distress HEENT normocephalic atraumatic extraocular movements intact sclera anicteric Neck supple trachea midline Lungs clear to auscultation bilaterally normal work of breathing at rest on room air Heart S1S2 no rub Abdomen soft nontender nondistended Extremities no to trace pitting edema  Psych normal mood and affect Neuro - alert and oriented x 3 provides hx and follows commands    Assessment and Plan:    AKI on CKD 3/4 likely from cardiorenal syndrome He appears to have plateaued  Continue supportive care  He is on torsemide 60 mg BID Acute on Chronic CHF with EF of 45-50% on dobutamine Note that Dr. Bensimhon has been in communication with the [facility] transplant team per charting Diuretics as above Subnephrotic proteinuria  Urine pr/Cr ratio 1410 mg/g on 04/22/24  Note free light chain ratio low at 0.14.  Note endomyocardial biopsy negative for cardiac amyloidosis  Acute blood loss anemia  s/p GI Bleed s/p EGD and colonoscopy Stable PRBC's per primary team  S/p IV iron He got aranesp on 04/28/24.  Intermittent dosing for now.  Note abnormal free light chain ratio and biopsy negative for amyloid Hypotesion on midodrine CKD stage 3/4  Baseline Cr 1.5 - 2.5.   He has had progression of his CKD  Will need close nephrology f/u at discharge in coordination with AHF Hypokalemia he has received potassium repletion    Disposition - continue inpatient monitoring   Recent Labs  Lab 04/29/24 0422 04/30/24 0425 05/01/24 0440  NA 138 138 138  K 3.1* 3.3* 3.3*  CL 96* 96* 96*  CO2 27 28 27   GLUCOSE 106* 104* 96  BUN 50* 51* 49*  CREATININE 4.78* 4.74* 4.69*  CALCIUM  9.0 9.1 9.2   Recent Labs  Lab 04/29/24 0422 04/30/24 0425 05/01/24 0440  WBC 6.6 6.4 6.4  NEUTROABS  --   --  4.1  HGB 8.7* 8.7* 9.1*  HCT 27.1* 27.8* 28.6*  MCV 82.9 84.8 84.4  PLT 332 341 336     Katheryn JAYSON Saba, MD 1:05 PM 05/01/2024

## 2024-05-02 DIAGNOSIS — I5043 Acute on chronic combined systolic (congestive) and diastolic (congestive) heart failure: Secondary | ICD-10-CM | POA: Diagnosis not present

## 2024-05-02 LAB — GLUCOSE, CAPILLARY
Glucose-Capillary: 103 mg/dL — ABNORMAL HIGH (ref 70–99)
Glucose-Capillary: 158 mg/dL — ABNORMAL HIGH (ref 70–99)
Glucose-Capillary: 144 mg/dL — ABNORMAL HIGH (ref 70–99)
Glucose-Capillary: 135 mg/dL — ABNORMAL HIGH (ref 70–99)

## 2024-05-02 LAB — COOXEMETRY PANEL
Carboxyhemoglobin: 1.8 % — ABNORMAL HIGH (ref 0.5–1.5)
Methemoglobin: <0.7 % (ref 0.0–1.5)
O2 Saturation: 68 %
Total hemoglobin: 9.8 g/dL — ABNORMAL LOW (ref 12.0–16.0)

## 2024-05-02 LAB — BASIC METABOLIC PANEL WITH GFR
Anion gap: 15 (ref 5–15)
BUN: 50 mg/dL — ABNORMAL HIGH (ref 6–20)
CO2: 28 mmol/L (ref 22–32)
Calcium: 9.3 mg/dL (ref 8.9–10.3)
Chloride: 96 mmol/L — ABNORMAL LOW (ref 98–111)
Creatinine, Ser: 4.58 mg/dL — ABNORMAL HIGH (ref 0.61–1.24)
GFR, Estimated: 14 mL/min — ABNORMAL LOW (ref >60)
Glucose, Bld: 98 mg/dL (ref 70–99)
Potassium: 3.5 mmol/L (ref 3.5–5.1)
Sodium: 139 mmol/L (ref 135–145)

## 2024-05-02 MED ORDER — POTASSIUM CHLORIDE CRYS ER 20 MEQ PO TBCR
40.0000 meq | EXTENDED_RELEASE_TABLET | Freq: Once | ORAL | Status: AC
  Administered 2024-05-02: 40 meq via ORAL
  Filled 2024-05-02: qty 2

## 2024-05-02 NOTE — Progress Notes (Signed)
 [facility] Progress Note Patient: Keith Leblanc FMW:[id number] DOB: 06-24-1966  DOA: 04/17/2024 DOS: the patient was seen and examined on 05/02/2024  Brief Hospital Course: PMH of HTN, type II DM, CVA presented to hospital with complaints of shortness of breath. Currently being treated for heart failure. Advanced heart failure team and nephrology both following. Underwent right heart cath on 10/31. Was also treated with the pulmonary. Outpatient evaluation planned for kidney and heart transplant.  Assessment and Plan: Acute on chronic systolic CHF. EF 45 to 50%. Severe LVH\  with abnormal RV free wall strain HCM Hypotension Nonsustained V. tach: EF 45-50% in 01/2024, now at 43%, RV mildly reduced-previously endomyocardial biopsy negative for cardiac amyloidosis-Suspect HCM.  Receiving IV Lasix  along with midodrine due to some hypotension> creatinine started to trend up S/p RHC 10/31-with RV dysfunction (see report) s/ p central line and started dobutamine drip and high-dose Lasix , and metolazone few doses GDMT limited due to hypotension and renal failure. Now on torsemide. Advanced heart failure team managing input appreciated>, Dr. Cherrie discussed with [facility] transplant about referral for heart kidney transplant and they will see him in the outpatient.    AKI on CKD stage iv Metabolic acidosis Hypokalemia: Creat b/l 1.5-2.5.likely multifactorial initially suspecting AKI on CKD due to GI bleeding, severe anemia, hypotension,RV dysfunction/CHF and need for diuretics> now appears to be CRS-hopefully did not develop ATN.  Now looking into heart and kidney transplant at [facility] Nephro following closely-monitor renal function, replace electrolytes, monitor intake output.  Currently creatinine plateaued.   ABLA due to upper GI bleed with friable congested gastric mucosa Hematochezia due to internal hemorrhoids:  history of LA grade a reflux esophagitis and gastritis seen on EGD in  2019. S/p EGD 10/27: congested friable gastric mucosa likely the source of bleeding.  Colonoscopy 10/30-internal hemorrhoids likely cause of hematochezia doubt it accounts for patient's drop in hemoglobin. So far received 3 units PRBC- hb holding >8 gm, S/P IV iron Continue to monitor hemoglobin. Eliquis  resumed 11/1 after GI clearance, cont ppi     Chronic depression Insomnia/delirium overnight: Patient having sleep disorder delirium overnight, placed on trazodone as needed.  Continue home bupropion .   Increase trazodone to 100 mg nightly as needed   Elevated troponin: Due to demand ischemia.  Plan per cardiology   Hyperlipidemia: continue  crestor    Persistent A-fib: Eliquis  resumed 11/1.Rate is stable monitor in telemetry  Subjective: Breathing better.  No chest pain.  Reports retention of the urine.  Physical Exam: Basilar crackles. S1-S2 present Bowel sound present Trace edema right more than left.  Data Reviewed: I have Reviewed nursing notes, Vitals, and Lab results. Since last encounter, pertinent lab results CBC and BMP   . I have ordered test including CBC and BMP  . I have discussed pt's care plan and test results with nephrology  .   Disposition: Status is: Inpatient Remains inpatient appropriate because: Monitor for improvement in volume status and cardiology recommendation.  Place TED hose Start: 04/21/24 1237 SCDs Start: 04/17/24 2333 apixaban  (ELIQUIS ) tablet 5 mg    Family Communication: No one at bedside Level of care: Progressive   Vitals:   05/01/24 2351 05/02/24 0444 05/02/24 1119 05/02/24 1159  BP: 98/68 94/63  95/62  Pulse:    67  Resp: (!) 21 12 15 17   Temp: 98.1 F (36.7 C) 98.4 F (36.9 C)  98.2 F (36.8 C)  TempSrc: Oral Oral  Oral  SpO2: 99% 96%  96%  Weight:  98.4 kg  Height:         Author: Yetta Blanch, MD 05/02/2024 4:16 PM  Please look on [email or web address] to find out who is on call.

## 2024-05-02 NOTE — Progress Notes (Signed)
 Mr. Keith Leblanc,  The two polyps which I removed during your recent procedure were proven to be completely benign but are considered pre-cancerous polyps that MAY have grown into cancer if they had not been removed.  Studies shows that at least 20% of women over age 56 and 30% of men over age 34 have pre-cancerous polyps.  Based on current nationally recognized surveillance guidelines, I recommend that you have a repeat colonoscopy in 7 years.

## 2024-05-02 NOTE — Progress Notes (Signed)
   05/02/24 1129  Urine Measurement/Characteristics  Urine (mL) 200 mL  Urinary Incontinence No  Urine Color Yellow/straw  Urine Appearance Clear  Urinary Interventions Post void residual  Post Void Residual 26 mL

## 2024-05-02 NOTE — Progress Notes (Signed)
 Admit: 04/17/2024 LOS: 15  89M AKI on CKD4 with ABLA, AoCk HFrEF  Subjective:  He had 1.8 liters UOP over 11/8.  Note dobutamine was stopped on 11/8.  He states that he has the sensation of urinary urgency that doesn't always seem to match the amount of urine he makes (really has to urinate and then arrives and not much output).  Then 5-10 minutes later, he has to go again and same thing happens.     Review of systems:  Denies shortness of breath  Denies chest pain  Denies n/v  Ambulated in hall yesterday and plans to do this today.  Not really short of breath with ambulation yesterday - he had to recover after walking for just a couple of minutes because his legs felt weaker    11/08 0701 - 11/09 0700 In: 340 [P.O.:340] Out: 1750 [Urine:1750]  Filed Weights   04/30/24 0523 05/01/24 0520 05/02/24 0444  Weight: 97.3 kg 100.2 kg 98.4 kg    Scheduled Meds:  sodium chloride    Intravenous Once   apixaban   5 mg Oral BID   buPROPion   300 mg Oral Daily   Chlorhexidine  Gluconate Cloth  6 each Topical Daily   insulin  aspart  0-9 Units Subcutaneous TID WC   midodrine  15 mg Oral TID WC   pantoprazole   40 mg Oral BID   potassium chloride   40 mEq Oral Once   rosuvastatin   20 mg Oral Daily   senna-docusate  1 tablet Oral BID   simethicone   240 mg Oral Once   sodium chloride  flush  10-40 mL Intracatheter Q12H   torsemide  60 mg Oral BID   traZODone  50 mg Oral Once   Continuous Infusions:   PRN Meds:.acetaminophen  **OR** acetaminophen , alum & mag hydroxide-simeth, ondansetron  **OR** ondansetron  (ZOFRAN ) IV, polyethylene glycol, sodium chloride  flush, traMADol, traZODone  Current Labs: reviewed   Physical Exam:  Blood pressure 94/63, pulse 72, temperature 98.4 F (36.9 C), temperature source Oral, resp. rate 12, height 6' 5 (1.956 m), weight 98.4 kg, SpO2 96%.  General adult male in bed in no acute distress  HEENT normocephalic atraumatic extraocular movements intact sclera  anicteric Neck supple trachea midline Lungs clear to auscultation bilaterally normal work of breathing at rest on room air Heart S1S2 no rub Abdomen soft nontender nondistended Extremities no edema appreciated Psych normal mood and affect Neuro - alert and oriented x 3 provides hx and follows commands    Assessment and Plan:   AKI on CKD 4 likely from cardiorenal syndrome He appears to have plateaued  Continue supportive care  He is on torsemide 60 mg BID See Work-up of proteinuria as below - sending some serologies Acute on Chronic CHF with EF of 45-50% Suspected hypertrophic cardiomyopathy per CHF note Now off of dobutamine   Note that Dr. Bensimhon has been in communication with the [facility] transplant team per charting Diuretics as above Subnephrotic proteinuria  Urine pr/Cr ratio 1410 mg/g on 04/22/24 Free light chain ratio low at 0.14.  No M spike on SPEP. A1c 5.4. Note endomyocardial biopsy negative for cardiac amyloidosis  Reviewed prior work-up.  Send ANCA and ANA Urinary urgency Primary team has ordered a bladder scan May need flomax Acute blood loss anemia  s/p GI Bleed s/p EGD and colonoscopy Stable on last check PRBC's per primary team  S/p IV iron He got aranesp on 04/28/24.  Intermittent dosing for now.  Note abnormal free light chain ratio and biopsy negative for amyloid  CBC in AM Hypotesion - on midodrine CKD stage 4  Baseline Cr was 1.5 - 2.5 prior to September but he has progressed since then Will need close nephrology f/u at discharge in coordination with Advanced heart failure Hypokalemia he is ordered for 40 meq potassium repletion  - agree  Disposition - continue inpatient monitoring   Recent Labs  Lab 04/30/24 0425 05/01/24 0440 05/02/24 0457  NA 138 138 139  K 3.3* 3.3* 3.5  CL 96* 96* 96*  CO2 28 27 28   GLUCOSE 104* 96 98  BUN 51* 49* 50*  CREATININE 4.74* 4.69* 4.58*  CALCIUM  9.1 9.2 9.3   Recent Labs  Lab 04/29/24 0422 04/30/24 0425  05/01/24 0440  WBC 6.6 6.4 6.4  NEUTROABS  --   --  4.1  HGB 8.7* 8.7* 9.1*  HCT 27.1* 27.8* 28.6*  MCV 82.9 84.8 84.4  PLT 332 341 336     Katheryn JAYSON Saba, MD 10:45 AM 05/02/2024

## 2024-05-02 NOTE — Plan of Care (Signed)

## 2024-05-03 DIAGNOSIS — I5043 Acute on chronic combined systolic (congestive) and diastolic (congestive) heart failure: Secondary | ICD-10-CM | POA: Diagnosis not present

## 2024-05-03 LAB — CBC
HCT: 39.6 % (ref 39.0–52.0)
Hemoglobin: 12.4 g/dL — ABNORMAL LOW (ref 13.0–17.0)
MCH: 26.5 pg (ref 26.0–34.0)
MCHC: 31.3 g/dL (ref 30.0–36.0)
MCV: 84.6 fL (ref 80.0–100.0)
Platelets: 301 K/uL (ref 150–400)
RBC: 4.68 MIL/uL (ref 4.22–5.81)
RDW: 17.7 % — ABNORMAL HIGH (ref 11.5–15.5)
WBC: 3.9 K/uL — ABNORMAL LOW (ref 4.0–10.5)
nRBC: 0 % (ref 0.0–0.2)

## 2024-05-03 LAB — BASIC METABOLIC PANEL WITH GFR
Anion gap: 15 (ref 5–15)
BUN: 50 mg/dL — ABNORMAL HIGH (ref 6–20)
CO2: 32 mmol/L (ref 22–32)
Calcium: 9.4 mg/dL (ref 8.9–10.3)
Chloride: 93 mmol/L — ABNORMAL LOW (ref 98–111)
Creatinine, Ser: 4.39 mg/dL — ABNORMAL HIGH (ref 0.61–1.24)
GFR, Estimated: 15 mL/min — ABNORMAL LOW (ref >60)
Glucose, Bld: 103 mg/dL — ABNORMAL HIGH (ref 70–99)
Potassium: 3.2 mmol/L — ABNORMAL LOW (ref 3.5–5.1)
Sodium: 140 mmol/L (ref 135–145)

## 2024-05-03 LAB — COOXEMETRY PANEL
Carboxyhemoglobin: 2.3 % — ABNORMAL HIGH (ref 0.5–1.5)
Carboxyhemoglobin: 1.8 % — ABNORMAL HIGH (ref 0.5–1.5)
Methemoglobin: <0.7 % (ref 0.0–1.5)
Methemoglobin: <0.7 % (ref 0.0–1.5)
O2 Saturation: 53.5 %
O2 Saturation: 67.5 %
Total hemoglobin: 15.3 g/dL (ref 12.0–16.0)
Total hemoglobin: 10.1 g/dL — ABNORMAL LOW (ref 12.0–16.0)

## 2024-05-03 LAB — URINALYSIS, COMPLETE (UACMP) WITH MICROSCOPIC
Bilirubin Urine: NEGATIVE
Glucose, UA: NEGATIVE mg/dL
Ketones, ur: NEGATIVE mg/dL
Leukocytes,Ua: NEGATIVE
Nitrite: NEGATIVE
Protein, ur: 30 mg/dL — AB
Specific Gravity, Urine: 1.008 (ref 1.005–1.030)
pH: 5 (ref 5.0–8.0)

## 2024-05-03 LAB — GLUCOSE, CAPILLARY
Glucose-Capillary: 118 mg/dL — ABNORMAL HIGH (ref 70–99)
Glucose-Capillary: 141 mg/dL — ABNORMAL HIGH (ref 70–99)

## 2024-05-03 LAB — MAGNESIUM: Magnesium: 1.9 mg/dL (ref 1.7–2.4)

## 2024-05-03 MED ORDER — POTASSIUM CHLORIDE CRYS ER 20 MEQ PO TBCR
40.0000 meq | EXTENDED_RELEASE_TABLET | Freq: Two times a day (BID) | ORAL | Status: DC
  Administered 2024-05-03: 40 meq via ORAL
  Filled 2024-05-03: qty 2

## 2024-05-03 MED ORDER — TORSEMIDE 20 MG PO TABS: 60.0000 mg | ORAL_TABLET | Freq: Two times a day (BID) | ORAL | 0 refills | Status: AC

## 2024-05-03 MED ORDER — OXYBUTYNIN CHLORIDE 5 MG PO TABS: 5.0000 mg | ORAL_TABLET | Freq: Three times a day (TID) | ORAL | Status: DC | PRN

## 2024-05-03 MED ORDER — OXYBUTYNIN CHLORIDE 5 MG PO TABS: 5.0000 mg | ORAL_TABLET | Freq: Three times a day (TID) | ORAL | 0 refills | Status: DC | PRN

## 2024-05-03 MED ORDER — MIDODRINE HCL 5 MG PO TABS: 15.0000 mg | ORAL_TABLET | Freq: Three times a day (TID) | ORAL | 0 refills | Status: DC

## 2024-05-03 MED ORDER — FAMOTIDINE 20 MG PO TABS: 20.0000 mg | ORAL_TABLET | Freq: Every day | ORAL | 1 refills | Status: DC

## 2024-05-03 MED ORDER — PANTOPRAZOLE SODIUM 40 MG PO TBEC: 40.0000 mg | DELAYED_RELEASE_TABLET | Freq: Every day | ORAL | 0 refills | Status: DC

## 2024-05-03 MED ORDER — PANTOPRAZOLE SODIUM 40 MG PO TBEC: 40.0000 mg | DELAYED_RELEASE_TABLET | Freq: Two times a day (BID) | ORAL | 0 refills | Status: DC

## 2024-05-03 MED ORDER — POTASSIUM CHLORIDE CRYS ER 20 MEQ PO TBCR: 40.0000 meq | EXTENDED_RELEASE_TABLET | Freq: Two times a day (BID) | ORAL | 0 refills | Status: DC

## 2024-05-03 MED ORDER — TORSEMIDE 60 MG PO TABS: 60.0000 mg | ORAL_TABLET | Freq: Two times a day (BID) | ORAL | 0 refills | Status: DC

## 2024-05-03 NOTE — Progress Notes (Signed)
 CVAD removed per protocol per MD order. Manual pressure applied until hemostasis was achieved. Vaseline gauze, gauze, and Tegaderm applied over insertion site. No bleeding or swelling noted. Instructed patient to remain in bed for thirty mins. Educated patient about S/S of infection and when to call MD; no heavy lifting or pressure on L side for 24 hours; keep dressing dry and intact for 24 hours. Pt verbalized comprehension.

## 2024-05-03 NOTE — Progress Notes (Signed)
 Recheck CO-OX this am is stable off inotrope support.  Okay for discharge from HF standpoint.  Home HF meds: Apixaban  5 mg BID Midodrine 15 mg TID Torsemide 60 mg BID Potassium chloride  40 mEq BID Rosuvastatin  20 mg daily  Has close follow-up scheduled in HF clinic.

## 2024-05-03 NOTE — Progress Notes (Signed)
 [facility] NEPHROLOGY PROGRESS NOTE  Assessment/ Plan: Pt is a 58 y.o. yo male with shortness of breath due to heart failure and found to have AKI.  # Acute kidney injury on CKD 4 likely from cardiorenal syndrome: The patient had Right Heart CHF team and treated with dobutamine and diuretics.  Now switched to torsemide 60 mg twice a day, off of inotropes.  Clinically looks euvolemic.Continue strict I/o and close lab monitor. Fortunately, he has no signs or symptoms of uremia and no urgent need for dialysis.  We have discussed about possible need of dialysis in the future and importance of close follow-up.  # Acute on chronic CHF with EF of 45 to 50%.  Treated with inotropes and diuretics.  Cardiology is talking about possible treatment at [facility] for both kidney and heart transplant.  Now managing with oral diuretics.  # Hypotension on midodrine.  Monitor BP.  # Hypokalemia: Replete potassium chloride .  # Anemia: Hemoglobin usually around 8.7-9 however it is elevated 12.4 today likely lab error.  Discussed with the patient and his sister at the bedside today.   Subjective: Seen and examined at bedside.  Urine output is around 2.3 L.  Denies nausea, vomiting, chest pain or shortness of breath.   Objective Vital signs in last 24 hours: Vitals:   05/03/24 0000 05/03/24 0500 05/03/24 0730 05/03/24 1109  BP: (!) 95/58 91/64 104/63 101/62  Pulse: 75 68 73 74  Resp: 20 20 16 20   Temp: 98.6 F (37 C) 98.7 F (37.1 C) 98.1 F (36.7 C) 98.2 F (36.8 C)  TempSrc: Oral Oral Oral Oral  SpO2: 100% 99% 95% 99%  Weight:  96.3 kg    Height:       Weight change: -2.177 kg  Intake/Output Summary (Last 24 hours) at 05/03/2024 1114 Last data filed at 05/03/2024 1110 Gross per 24 hour  Intake 390 ml  Output 2375 ml  Net -1985 ml       Labs: RENAL PANEL Recent Labs  Lab 04/29/24 0422 04/30/24 0425 05/01/24 0440 05/02/24 0457 05/03/24 0525  NA 138 138 138 139 140  K  3.1* 3.3* 3.3* 3.5 3.2*  CL 96* 96* 96* 96* 93*  CO2 27 28 27 28  32  GLUCOSE 106* 104* 96 98 103*  BUN 50* 51* 49* 50* 50*  CREATININE 4.78* 4.74* 4.69* 4.58* 4.39*  CALCIUM  9.0 9.1 9.2 9.3 9.4  MG  --  1.8  --   --  1.9    Liver Function Tests: No results for input(s): AST, ALT, ALKPHOS, BILITOT, PROT, ALBUMIN in the last 168 hours. No results for input(s): LIPASE, AMYLASE in the last 168 hours. No results for input(s): AMMONIA in the last 168 hours. CBC: Recent Labs    04/24/24 0500 04/25/24 0547 04/28/24 2030 04/29/24 0422 04/30/24 0425 05/01/24 0440 05/03/24 0525  HGB 8.3*   < > 8.8* 8.7* 8.7* 9.1* 12.4*  MCV 83.2   < >  --  82.9 84.8 84.4 84.6  FERRITIN 14*  --   --   --   --   --   --   TIBC 357  --   --   --   --   --   --   IRON 13*  --   --   --   --   --   --    < > = values in this interval not displayed.    Cardiac Enzymes: No results for input(s): CKTOTAL, CKMB,  CKMBINDEX, TROPONINI in the last 168 hours. CBG: Recent Labs  Lab 05/02/24 1156 05/02/24 1549 05/02/24 2148 05/03/24 0610 05/03/24 1111  GLUCAP 158* 144* 135* 118* 141*    Iron Studies: No results for input(s): IRON, TIBC, TRANSFERRIN, FERRITIN in the last 72 hours. Studies/Results: No results found.  Medications: Infusions:   Scheduled Medications:  sodium chloride    Intravenous Once   apixaban   5 mg Oral BID   buPROPion   300 mg Oral Daily   Chlorhexidine  Gluconate Cloth  6 each Topical Daily   insulin  aspart  0-9 Units Subcutaneous TID WC   midodrine  15 mg Oral TID WC   pantoprazole   40 mg Oral BID   potassium chloride   40 mEq Oral BID   rosuvastatin   20 mg Oral Daily   senna-docusate  1 tablet Oral BID   simethicone   240 mg Oral Once   sodium chloride  flush  10-40 mL Intracatheter Q12H   torsemide  60 mg Oral BID   traZODone  50 mg Oral Once    have reviewed scheduled and prn medications.  Physical Exam: General:NAD,  comfortable Heart:RRR, s1s2 nl Lungs:clear b/l, no crackle Abdomen:soft, Non-tender, non-distended Extremities:No edema Neurology: Alert, awake and following commands.  Waneda Klammer Prasad Miranda Garber 05/03/2024,11:14 AM  LOS: 16 days

## 2024-05-03 NOTE — TOC Transition Note (Signed)
 Transition of Care [facility]) - Discharge Note   Patient Details  Name: Keith Leblanc MRN: [id number] Date of Birth: 1965-11-24  [facility]) CM/SW Contact:  Justina Delcia Czar, RN Phone Number: [phone] 05/03/2024, 1:01 PM   Clinical Narrative:     Spoke to pt at bedside. Pt has scale at home for daily weights. Has Living Better with HF booklet, reviewed with pt low sodium/heart healthy, daily weights, and medication adherence.  Family will provide transportation home.   PCP scheduled and placed on AVS.   Final next level of care: Home/Self Care Barriers to Discharge: No Barriers Identified   Patient Goals and CMS Choice Patient states their goals for this hospitalization and ongoing recovery are:: wants to remain independent    Discharge Placement     Discharge Plan and Services Additional resources added to the After Visit Summary for     Discharge Planning Services: CM Consult   Social Drivers of Health (SDOH) Interventions SDOH Screenings   Food Insecurity: No Food Insecurity (04/18/2024)  Housing: Low Risk  (04/18/2024)  Transportation Needs: No Transportation Needs (04/18/2024)  Utilities: Not At Risk (04/18/2024)  Depression (PHQ2-9): Low Risk  (03/23/2024)  Tobacco Use: Medium Risk (04/23/2024)     Readmission Risk Interventions    03/15/2024   12:35 PM  Readmission Risk Prevention Plan  Transportation Screening Complete  HRI or Home Care Consult Complete  Social Work Consult for Recovery Care Planning/Counseling Complete  Palliative Care Screening Not Applicable  Medication Review Oceanographer) Referral to Pharmacy

## 2024-05-03 NOTE — TOC Transition Note (Signed)
 Transition of Care [facility] - Discharge Note   Patient Details  Name: Micky Sheller MRN: [id number] Date of Birth: 26-Aug-1965  Transition of Care [facility]) CM/SW Contact:  Arlana JINNY Nicholaus ISRAEL Phone Number: [phone] 05/03/2024, 12:35 PM   Clinical Narrative:   HF CSW called to schedule patients hospital follow up appointment with PCP for  Thursday, May 06, 2024 at 10:00 AM.    Patient has support for transportation at [employer].     Final next level of care: Home/Self Care Barriers to Discharge: No Barriers Identified   Patient Goals and CMS Choice Patient states their goals for this hospitalization and ongoing recovery are:: wants          Discharge Placement                       Discharge Plan and Services Additional resources added to the After Visit Summary for                                       Social Drivers of Health (SDOH) Interventions SDOH Screenings   Food Insecurity: No Food Insecurity (04/18/2024)  Housing: Low Risk  (04/18/2024)  Transportation Needs: No Transportation Needs (04/18/2024)  Utilities: Not At Risk (04/18/2024)  Depression (PHQ2-9): Low Risk  (03/23/2024)  Tobacco Use: Medium Risk (04/23/2024)     Readmission Risk Interventions    03/15/2024   12:35 PM  Readmission Risk Prevention Plan  Transportation Screening Complete  HRI or Home Care Consult Complete  Social Work Consult for Recovery Care Planning/Counseling Complete  Palliative Care Screening Not Applicable  Medication Review Oceanographer) Referral to Pharmacy

## 2024-05-03 NOTE — Progress Notes (Signed)
 Discharge Nurse Summary: DC order noted per MD. DC RN at bedside with patient/family. Patient agreeable with discharge plan. AVS printed/reviewed. PIV removed, skin intact. No DME needs. No home/TOC meds. CP/Edu resolved. Telemonitor returned to charging station. All belongings accounted for. Dressing to central line removal site CDI w/o bleeding or drainage. See LDAs. Patient wheeled downstairs for discharge by private auto.   Rosario EMERSON Lund, RN

## 2024-05-03 NOTE — Progress Notes (Signed)
 Patient ID: Keith Leblanc, male   DOB: 1965-12-05, 58 y.o.   MRN: [id number]     Advanced Heart Failure Rounding Note  Cardiologist: None  Chief Complaint: A/c HFmrEF, GIB  Patient Profile   58 y/o male w/ prior h/o heavy ETOH use (recently quit), HFmrEF, CKDIIIa, PAF on Eliquis , HTN, HLD and Type 2DM, admitted w/ symptomatic ABLA 2/2 GIB. HF management c/b persistent hypotension and AKI despite adequate volume resuscitation. AHF team asked to assist.   Subjective:    Co-ox 68% => 53.5% early am off dobutamine. Creatinine 4.74 => 4.69 => 4.39. CVP 8 with weight trending down.  I/Os negative.   No dyspnea, walking in halls.   Objective:    Weight Range: 96.3 kg Body mass index is 25.16 kg/m.   Vital Signs:   Temp:  [98.2 F (36.8 C)-98.7 F (37.1 C)] 98.7 F (37.1 C) (11/10 0500) Pulse Rate:  [62-83] 68 (11/10 0500) Resp:  [15-22] 20 (11/10 0500) BP: (91-101)/(58-74) 91/64 (11/10 0500) SpO2:  [96 %-100 %] 99 % (11/10 0500) Weight:  [96.3 kg] 96.3 kg (11/10 0500) Last BM Date : 05/01/24  Weight change: Filed Weights   05/01/24 0520 05/02/24 0444 05/03/24 0500  Weight: 100.2 kg 98.4 kg 96.3 kg   Intake/Output:  Intake/Output Summary (Last 24 hours) at 05/03/2024 0727 Last data filed at 05/03/2024 0500 Gross per 24 hour  Intake 370 ml  Output 2300 ml  Net -1930 ml    Physical Exam   General: NAD Neck: JVP 8 cm, no thyromegaly or thyroid  nodule.  Lungs: Clear to auscultation bilaterally with normal respiratory effort. CV: Nondisplaced PMI.  Heart irregular S1/S2, no S3/S4, no murmur.  Trace ankle edema.  Abdomen: Soft, nontender, no hepatosplenomegaly, no distention.  Skin: Intact without lesions or rashes.  Neurologic: Alert and oriented x 3.  Psych: Flat affect. Extremities: No clubbing or cyanosis.  HEENT: Normal.   EKG   AF 50-60s (personally reviewed)  Labs    CBC Recent Labs    05/01/24 0440 05/03/24 0525  WBC 6.4 3.9*  NEUTROABS 4.1  --    HGB 9.1* 12.4*  HCT 28.6* 39.6  MCV 84.4 84.6  PLT 336 301   Basic Metabolic Panel Recent Labs    88/90/74 0457 05/03/24 0525  NA 139 140  K 3.5 3.2*  CL 96* 93*  CO2 28 32  GLUCOSE 98 103*  BUN 50* 50*  CREATININE 4.58* 4.39*  CALCIUM  9.3 9.4   BNP (last 3 results) Recent Labs    03/24/24 1624 04/17/24 1704  BNP 1,093.8* 1,324.0*   ProBNP (last 3 results) Recent Labs    03/12/24 0942  PROBNP 5,364.0*   Medications:    Scheduled Medications:  sodium chloride    Intravenous Once   apixaban   5 mg Oral BID   buPROPion   300 mg Oral Daily   Chlorhexidine  Gluconate Cloth  6 each Topical Daily   insulin  aspart  0-9 Units Subcutaneous TID WC   midodrine  15 mg Oral TID WC   pantoprazole   40 mg Oral BID   potassium chloride   40 mEq Oral BID   rosuvastatin   20 mg Oral Daily   senna-docusate  1 tablet Oral BID   simethicone   240 mg Oral Once   sodium chloride  flush  10-40 mL Intracatheter Q12H   torsemide  60 mg Oral BID   traZODone  50 mg Oral Once    Infusions:    PRN Medications: acetaminophen  **OR** acetaminophen , ondansetron  **OR** ondansetron  (  ZOFRAN ) IV, polyethylene glycol, sodium chloride  flush, traMADol, traZODone  Assessment/Plan   1. Acute on chronic HF with mid range EF: Echo in 8/25 showed EF 45-50%, severe LVH, abnormal RV free wall strain, ~17%, RV mildly reduced.  Cardiac MRI showed moderate concentric LVH with EF 43%, RV EF 47%, ECV 36%, diffuse patchy LGE throughout LV myocardium. PYP scan negative.  He had an endomyocardial biopsy that was negative for cardiac amyloidosis. Suspect hypertrophic cardiomyopathy.  - DBA started with persistent low output, co-ox 33>30.  - He is now off dobutamine, co-ox 68% => early am today 53.5%.  We will repeat co-ox this morning, if < 55% will restart dobutamine at 2.5 for home.  If > 55%, can go home without.  He will need to be seen at [facility] for heart/kidney transplant evaluation (Dr. Cherrie spoke with Dr.  Devore already who feels he may be a candidate). Blood type O pos.  - CVP 8, continue torsemide 60 mg bid.  Creatinine slowly trending down, 4.39 today.   - Will need to add KCl 40 bid.  - GDMT limited with low output, CKD, and hypotension (SBP 90s-100s, on midodrine).  - Continue current midodrine 15 mg tid for now to maintain MAP.   2. AKI on CKD stage 3b: Renal US  negative. ?ATN in setting of hypotension from GI bleeding.   - Creatinine slowly trending down 4.69 => 4.39.  - nephrology now following, subnephrotic proteinuria noted.   - Renal US  normal - Multiple Myeloma panel 9/25 negative for M-spike protein   3. Atrial fibrillation: Persistent  - Rate controlled.  - continue eliquis  5 mg bid    4. GI bleeding: Has had transfusions.  EGD with gastritis.  Colonoscopy w/ 2 polyps (resected) and non bleeding hemorrhoids. - No bleeding.  - Tsat 4%, given IV Fe.  - Hgb much higher this morning, ?accuracy => resend.  - Continue eliquis     5. ETOH abuse.  - he reports he quit September 16th. Consult TOC for alcohol cessation program.  - RUQ US  w/o frank cirrhosis.   6. Delirium - improving with tramadol  Length of Stay: 16  Ezra Shuck, MD  05/03/2024, 7:27 AM

## 2024-05-04 ENCOUNTER — Telehealth: Payer: Self-pay

## 2024-05-04 NOTE — Discharge Summary (Signed)
 Physician Discharge Summary   Patient: Keith Leblanc MRN: [id number] DOB: April 02, 1966  Admit date:     04/17/2024  Discharge date: 05/03/2024  Discharge Physician: Yetta Blanch  PCP: [name], NP  Recommendations at discharge: Follow-up with PCP in 1 week. Repeat CBC and BMP. Follow-up with cardiology as recommended. Follow-up with nephrology.   Follow-up Information     Nafziger, Huxley, NP Follow up in 1 week(s).   Specialty: Family Medicine Why: TIME 10:00 AM  PLEASE ARRIVE AT 9:30 AM DATE : NOVEMBER 13 , 2025 THURSDAY ADDRESS : [address], [place] [address] PHONE # [phone] PLEASE BRING ALL CURRENT MEDICATION, ID and INS CARD, with BMP lab to look at kidney/electrolyte numbers Contact information: [address] [place] [address] [phone]         [facility] Follow up on 05/10/2024.   Specialty: Cardiology Why: Dr. Zenaida in [facility] 10 AM Entrance C, Free Valet parking Contact information: [address] [place]  [address] [phone]        [name], NP. Go in 3 day(s).   Specialty: Family Medicine Why: Hospital follow up appointment scheduled for Thursday, May 06, 2024 at 10:00 AM.  PLEASE ARRIVE 10-15 minutes early.  PLEASE call to cancel/reschedule if you CANNOT make appointment. Contact information: [address] [place] [address] [phone]                Hospital Course: PMH of HTN, type II DM, CVA presented to hospital with complaints of shortness of breath. Currently being treated for heart failure. Advanced heart failure team and nephrology both following. Underwent right heart cath on 10/31. Was also treated with the pulmonary. Outpatient evaluation planned for kidney and heart transplant.   Assessment and Plan: Acute on chronic systolic CHF. EF 45 to 50%. Severe LVH\  with abnormal  RV free wall strain HCM Hypotension Nonsustained V. tach: EF 45-50% in 01/2024, now at 43%, RV mildly reduced-previously endomyocardial biopsy negative for cardiac amyloidosis-Suspect HCM.  Receiving IV Lasix  along with midodrine due to some hypotension> creatinine started to trend up S/p RHC 10/31-with RV dysfunction (see report) s/ p central line and started dobutamine drip and high-dose Lasix , and metolazone few doses GDMT limited due to hypotension and renal failure. Now on torsemide. Advanced heart failure team managing input appreciated>, Dr. Cherrie discussed with [facility] transplant about referral for heart kidney transplant and they will see him in the outpatient.    AKI on CKD stage iv Metabolic acidosis Hypokalemia: Creat b/l 1.5-2.5.likely multifactorial initially suspecting AKI on CKD due to GI bleeding, severe anemia, hypotension,RV dysfunction/CHF and need for diuretics> now appears to be CRS-hopefully did not develop ATN.  Now looking into heart and kidney transplant at [facility] Nephro following closely-monitor renal function, replace electrolytes, monitor intake output.  Currently creatinine plateaued.   ABLA due to upper GI bleed with friable congested gastric mucosa Hematochezia due to internal hemorrhoids:  history of LA grade a reflux esophagitis and gastritis seen on EGD in 2019. S/p EGD 10/27: congested friable gastric mucosa likely the source of bleeding.  Colonoscopy 10/30-internal hemorrhoids likely cause of hematochezia doubt it accounts for patient's drop in hemoglobin. So far received 3 units PRBC- hb holding >8 gm, S/P IV iron Continue to monitor hemoglobin. Eliquis  resumed 11/1 after GI clearance, cont ppi     Chronic depression Insomnia/delirium overnight: Patient having sleep disorder delirium overnight, placed on trazodone as needed.  Continue home bupropion .     Elevated troponin: Due to demand ischemia.  No plan for intervention per cardiology    Hyperlipidemia: continue  crestor    Persistent A-fib: Eliquis  resumed 11/1.Rate is stable  Consultants:  Cardiology   Nephrology   Procedures performed:  Echocardiogram   DISCHARGE MEDICATION: Allergies as of 05/03/2024   No Known Allergies      Medication List     STOP taking these medications    bisoprolol  10 MG tablet Commonly known as: ZEBETA    empagliflozin  10 MG Tabs tablet Commonly known as: JARDIANCE    furosemide  20 MG tablet Commonly known as: LASIX    nicotine  7 mg/24hr patch Commonly known as: NICODERM CQ  - dosed in mg/24 hr   omeprazole  40 MG capsule Commonly known as: PRILOSEC   potassium chloride  10 MEQ tablet Commonly known as: KLOR-CON        TAKE these medications    B-D UF III MINI PEN NEEDLES 31G X 5 MM Misc Generic drug: Insulin  Pen Needle USE 4 TIMES DAILY AFTER MEALS AND AT BEDTIME   blood glucose meter kit and supplies Dispense based on patient and insurance preference. Use up to four times daily as directed. (FOR ICD-9 250.00, 250.01).   buPROPion  300 MG 24 hr tablet Commonly known as: WELLBUTRIN  XL TAKE 1 TABLET BY MOUTH ONCE  DAILY   colchicine  0.6 MG tablet TAKE 1 TABLET BY MOUTH DAILY  WHEN HAVING FLARES TAKE 1 TABLET BY MOUTH TWICE DAILY What changed: See the new instructions.   Eliquis  5 MG Tabs tablet Generic drug: apixaban  TAKE 1 TABLET BY MOUTH TWICE  DAILY   famotidine  20 MG tablet Commonly known as: Pepcid  Take 1 tablet (20 mg total) by mouth daily.   hydrOXYzine 50 MG capsule Commonly known as: VISTARIL Take 1 capsule (50 mg total) by mouth every 8 (eight) hours as needed. What changed: reasons to take this   magnesium  oxide 400 (240 Mg) MG tablet Commonly known as: MAG-OX TAKE 1 TABLET(400 MG) BY MOUTH DAILY   midodrine 5 MG tablet Commonly known as: PROAMATINE Take 3 tablets (15 mg total) by mouth 3 (three) times daily with meals.   One-A-Day Mens 50+ Tabs Take 1 tablet by mouth daily with  breakfast.   OneTouch Ultra Test test strip Generic drug: glucose blood Use to check blood sugar 2-3 times a day.   onetouch ultrasoft lancets Test blood sugars as directed. Dx E11.9   oxybutynin 5 MG tablet Commonly known as: DITROPAN Take 1 tablet (5 mg total) by mouth every 8 (eight) hours as needed for bladder spasms.   pantoprazole  40 MG tablet Commonly known as: PROTONIX  Take 1 tablet (40 mg total) by mouth daily.   potassium chloride  SA 20 MEQ tablet Commonly known as: KLOR-CON  M Take 2 tablets (40 mEq total) by mouth 2 (two) times daily. What changed:  when to take this reasons to take this   rosuvastatin  20 MG tablet Commonly known as: CRESTOR  Take 1 tablet (20 mg total) by mouth daily.   thiamine  100 MG tablet Commonly known as: Vitamin B-1 Take 1 tablet (100 mg total) by mouth daily.   torsemide 20 MG tablet Commonly known as: DEMADEX Take 3 tablets (60 mg total) by mouth 2 (two) times daily.   triamcinolone cream 0.5 % Commonly known as: KENALOG Apply 1 Application topically 2 (two) times daily.       Disposition: Home Diet recommendation: Cardiac diet  Discharge Exam: Vitals:   05/03/24 0000 05/03/24  0500 05/03/24 0730 05/03/24 1109  BP: (!) 95/58 91/64 104/63 101/62  Pulse: 75 68 73 74  Resp: 20 20 16 20   Temp: 98.6 F (37 C) 98.7 F (37.1 C) 98.1 F (36.7 C) 98.2 F (36.8 C)  TempSrc: Oral Oral Oral Oral  SpO2: 100% 99% 95% 99%  Weight:  96.3 kg    Height:       General: in Mild distress, No Rash Cardiovascular: S1 and S2 Present, No Murmur Respiratory: Good respiratory effort, Bilateral Air entry present. No Crackles, No wheezes Abdomen: Bowel Sound present, No tenderness Extremities: Trace edema Neuro: Alert and oriented x3, no new focal deficit   Filed Weights   05/01/24 0520 05/02/24 0444 05/03/24 0500  Weight: 100.2 kg 98.4 kg 96.3 kg   Condition at discharge: stable  The results of significant diagnostics from this  hospitalization (including imaging, microbiology, ancillary and laboratory) are listed below for reference.   Imaging Studies: US  Abdomen Limited RUQ (LIVER/GB) Result Date: 05/01/2024 CLINICAL DATA:  Abdominal distension. EXAM: ULTRASOUND ABDOMEN LIMITED RIGHT UPPER QUADRANT COMPARISON:  01/21/2003. FINDINGS: Gallbladder: No gallstones or wall thickening visualized. No sonographic Murphy sign noted by sonographer. Common bile duct: Diameter: 3.7 mm Liver: No focal lesion identified. Increased parenchymal echogenicity. Portal vein is patent on color Doppler imaging with normal direction of blood flow towards the liver. Other: A right pleural effusion is noted.  Trace ascites is noted. IMPRESSION: 1. Trace ascites. 2. Hepatic steatosis. 3. Right pleural effusion. Electronically Signed   By: Leita Birmingham M.D.   On: 05/01/2024 14:02   DG CHEST PORT 1 VIEW Result Date: 04/23/2024 CLINICAL DATA:  Central line placement EXAM: PORTABLE CHEST 1 VIEW COMPARISON:  Chest x-ray 04/17/2024, 03/12/2024 FINDINGS: Mild cardiomegaly with aortic atherosclerosis. No acute airspace disease, pleural effusion or pneumothorax. Left IJ central venous catheter tip projecting over the SVC origin. No pneumothorax is seen. IMPRESSION: Left IJ central venous catheter tip projects over the SVC origin. No pneumothorax. Mild cardiomegaly Electronically Signed   By: Luke Bun M.D.   On: 04/23/2024 16:46   CARDIAC CATHETERIZATION Result Date: 04/23/2024 1. Elevated right-sided filling pressures out of proportion to mildly elevated PCWP. 2. Mild pulmonary venous hypertension 3. Low cardiac output by thermo though preserved by Fick. 4. Low PAPi Suspect primarily RV dysfunction.  Will start dobutamine 2.5 for RV support.   US  RENAL Result Date: 04/22/2024 CLINICAL DATA:  Acute renal insufficiency. EXAM: RENAL / URINARY TRACT ULTRASOUND COMPLETE COMPARISON:  12/11/2021 FINDINGS: Right Kidney: Renal measurements: 12.0 x 4.6 x 6.0 cm  = volume: 174 mL. Echogenicity within normal limits. No mass or hydronephrosis visualized. Left Kidney: Renal measurements: 10.5 x 5.9 x 5.7 cm = volume: 186 mL. Echogenicity within normal limits. No mass or hydronephrosis visualized. Bladder: Appears normal for degree of bladder distention. Bilateral ureteral jets visualized. Other: Small right pleural effusion.  Tiny amount of perihepatic fluid. IMPRESSION: 1. Normal kidneys and bladder. 2. Small right pleural effusion. Tiny amount of perihepatic fluid. Electronically Signed   By: Toribio Agreste M.D.   On: 04/22/2024 16:39   ECHOCARDIOGRAM COMPLETE Result Date: 04/21/2024    ECHOCARDIOGRAM REPORT   Patient Name:   Keith Leblanc Date of Exam: 04/21/2024 Medical Rec #:  [id number]       Height:       77.0 in Accession #:    [id number]      Weight:       232.0 lb Date of Birth:  July 30, 1965  BSA:          2.382 m Patient Age:    58 years        BP:           87/70 mmHg Patient Gender: M               HR:           50 bpm. Exam Location:  Inpatient Procedure: 2D Echo, Cardiac Doppler and Color Doppler (Both Spectral and Color            Flow Doppler were utilized during procedure). Indications:    Congestive Heart Failure  History:        Patient has prior history of Echocardiogram examinations, most                 recent 03/16/2024. CHF, Stroke, Signs/Symptoms:Shortness of                 Breath and Dyspnea; Risk Factors:Former Smoker, Hypertension,                 Diabetes and Dyslipidemia.  Sonographer:    VALENTE, ADAM Referring Phys: [id number] BRITTAINY M SIMMONS IMPRESSIONS  1. Left ventricular ejection fraction, by estimation, is 40 to 45%. Left ventricular ejection fraction by PLAX is 43 %. The left ventricle has mildly decreased function. The left ventricle demonstrates global hypokinesis. There is severe left ventricular hypertrophy. Left ventricular diastolic parameters are consistent with Grade III diastolic dysfunction (restrictive). Elevated left  ventricular end-diastolic pressure. Avg E' 4.6 cm/s.  2. Right ventricular systolic function is moderately reduced. The right ventricular size is mildly enlarged. Mildly increased right ventricular wall thickness. There is mildly elevated pulmonary artery systolic pressure. The estimated right ventricular systolic pressure is 41.6 mmHg.  3. Left atrial size was mildly dilated.  4. The mitral valve is grossly normal. Trivial mitral valve regurgitation.  5. The aortic valve is tricuspid. Aortic valve regurgitation is not visualized.  6. The inferior vena cava is dilated in size with <50% respiratory variability, suggesting right atrial pressure of 15 mmHg. Comparison(s): Changes from prior study are noted. 02/25/2024: LVEF 55-60%. FINDINGS  Left Ventricle: Left ventricular ejection fraction, by estimation, is 40 to 45%. Left ventricular ejection fraction by PLAX is 43 %. The left ventricle has mildly decreased function. The left ventricle demonstrates global hypokinesis. The left ventricular internal cavity size was normal in size. There is severe left ventricular hypertrophy. Left ventricular diastolic parameters are consistent with Grade III diastolic dysfunction (restrictive). Elevated left ventricular end-diastolic pressure. Right Ventricle: The right ventricular size is mildly enlarged. Mildly increased right ventricular wall thickness. Right ventricular systolic function is moderately reduced. There is mildly elevated pulmonary artery systolic pressure. The tricuspid regurgitant velocity is 2.58 m/s, and with an assumed right atrial pressure of 15 mmHg, the estimated right ventricular systolic pressure is 41.6 mmHg. Left Atrium: Left atrial size was mildly dilated. Right Atrium: Right atrial size was normal in size. Pericardium: There is no evidence of pericardial effusion. Mitral Valve: The mitral valve is grossly normal. Trivial mitral valve regurgitation. Tricuspid Valve: The tricuspid valve is grossly normal.  Tricuspid valve regurgitation is mild. Aortic Valve: The aortic valve is tricuspid. Aortic valve regurgitation is not visualized. Pulmonic Valve: The pulmonic valve was grossly normal. Pulmonic valve regurgitation is trivial. Aorta: The aortic root and ascending aorta are structurally normal, with no evidence of dilitation. Venous: The inferior vena cava is dilated in size with less than 50% respiratory  variability, suggesting right atrial pressure of 15 mmHg. IAS/Shunts: No atrial level shunt detected by color flow Doppler.  LEFT VENTRICLE PLAX 2D LV EF:         Left            Diastology                ventricular     LV e' medial:  4.54 cm/s                ejection        LV e' lateral: 4.78 cm/s                fraction by                PLAX is 43                %. LVIDd:         4.30 cm LVIDs:         3.40 cm LV PW:         1.60 cm LV IVS:        1.30 cm LVOT diam:     2.20 cm LV SV:         65 LV SV Index:   27 LVOT Area:     3.80 cm  RIGHT VENTRICLE          IVC RV Basal diam:  4.40 cm  IVC diam: 2.10 cm RV Mid diam:    4.00 cm LEFT ATRIUM             Index        RIGHT ATRIUM           Index LA diam:        4.60 cm 1.93 cm/m   RA Area:     16.60 cm LA Vol (A2C):   75.3 ml 31.61 ml/m  RA Volume:   46.20 ml  19.39 ml/m LA Vol (A4C):   67.3 ml 28.25 ml/m LA Biplane Vol: 78.1 ml 32.78 ml/m  AORTIC VALVE LVOT Vmax:   96.00 cm/s LVOT Vmean:  60.800 cm/s LVOT VTI:    0.172 m  AORTA Ao Root diam: 3.40 cm TRICUSPID VALVE TR Peak grad:   26.6 mmHg TR Vmax:        258.00 cm/s  SHUNTS Systemic VTI:  0.17 m Systemic Diam: 2.20 cm Vinie Maxcy MD Electronically signed by Vinie Maxcy MD Signature Date/Time: 04/21/2024/5:05:22 PM    Final    DG Chest 2 View Result Date: 04/17/2024 CLINICAL DATA:  Shortness of breath. EXAM: DG CHEST 2V COMPARISON:  Radiograph 03/12/2024 FINDINGS: Mild cardiomegaly, stable from prior. Improved right lung base aeration with decreased elevation of hemidiaphragm and opacity. Minor  subsegmental atelectasis at the left lung base. No pulmonary edema. No pneumothorax. No significant pleural effusion. IMPRESSION: 1. Improved right lung base aeration with decreased elevation of hemidiaphragm and opacity. 2. Minor subsegmental atelectasis at the left lung base. Electronically Signed   By: Andrea Gasman M.D.   On: 04/17/2024 18:04    Microbiology: Results for orders placed or performed during the hospital encounter of 03/12/24  Resp panel by RT-PCR (RSV, Flu A&B, Covid) Anterior Nasal Swab     Status: None   Collection Time: 03/12/24  9:17 AM   Specimen: Anterior Nasal Swab  Result Value Ref Range Status   SARS Coronavirus 2 by RT PCR NEGATIVE NEGATIVE Final    Comment: (NOTE) SARS-CoV-2 target nucleic  acids are NOT DETECTED.  The SARS-CoV-2 RNA is generally detectable in upper respiratory specimens during the acute phase of infection. The lowest concentration of SARS-CoV-2 viral copies this assay can detect is 138 copies/mL. A negative result does not preclude SARS-Cov-2 infection and should not be used as the sole basis for treatment or other patient management decisions. A negative result may occur with  improper specimen collection/handling, submission of specimen other than nasopharyngeal swab, presence of viral mutation(s) within the areas targeted by this assay, and inadequate number of viral copies(<138 copies/mL). A negative result must be combined with clinical observations, patient history, and epidemiological information. The expected result is Negative.  Fact Sheet for Patients:  bloggercourse.com  Fact Sheet for Healthcare Providers:  seriousbroker.it  This test is no t yet approved or cleared by the [place]  FDA and  has been authorized for detection and/or diagnosis of SARS-CoV-2 by FDA under an Emergency Use Authorization (EUA). This EUA will remain  in effect (meaning this test can be used)  for the duration of the COVID-19 declaration under Section 564(b)(1) of the Act, 21 U.S.C.section 360bbb-3(b)(1), unless the authorization is terminated  or revoked sooner.       Influenza A by PCR NEGATIVE NEGATIVE Final   Influenza B by PCR NEGATIVE NEGATIVE Final    Comment: (NOTE) The Xpert Xpress SARS-CoV-2/FLU/RSV plus assay is intended as an aid in the diagnosis of influenza from Nasopharyngeal swab specimens and should not be used as a sole basis for treatment. Nasal washings and aspirates are unacceptable for Xpert Xpress SARS-CoV-2/FLU/RSV testing.  Fact Sheet for Patients: bloggercourse.com  Fact Sheet for Healthcare Providers: seriousbroker.it  This test is not yet approved or cleared by the [place]  FDA and has been authorized for detection and/or diagnosis of SARS-CoV-2 by FDA under an Emergency Use Authorization (EUA). This EUA will remain in effect (meaning this test can be used) for the duration of the COVID-19 declaration under Section 564(b)(1) of the Act, 21 U.S.C. section 360bbb-3(b)(1), unless the authorization is terminated or revoked.     Resp Syncytial Virus by PCR NEGATIVE NEGATIVE Final    Comment: (NOTE) Fact Sheet for Patients: bloggercourse.com  Fact Sheet for Healthcare Providers: seriousbroker.it  This test is not yet approved or cleared by the [place]  FDA and has been authorized for detection and/or diagnosis of SARS-CoV-2 by FDA under an Emergency Use Authorization (EUA). This EUA will remain in effect (meaning this test can be used) for the duration of the COVID-19 declaration under Section 564(b)(1) of the Act, 21 U.S.C. section 360bbb-3(b)(1), unless the authorization is terminated or revoked.  Performed at [facility], [address] [place], [place] [address]    Labs: CBC: Recent Labs  Lab  04/28/24 [id number] 04/28/24 2030 04/29/24 0422 04/30/24 0425 05/01/24 0440 05/03/24 0525  WBC 6.4  --  6.6 6.4 6.4 3.9*  NEUTROABS  --   --   --   --  4.1  --   HGB 7.9* 8.8* 8.7* 8.7* 9.1* 12.4*  HCT 24.7* 28.4* 27.1* 27.8* 28.6* 39.6  MCV 83.2  --  82.9 84.8 84.4 84.6  PLT 317  --  332 341 336 301   Basic Metabolic Panel: Recent Labs  Lab 04/29/24 0422 04/30/24 0425 05/01/24 0440 05/02/24 0457 05/03/24 0525  NA 138 138 138 139 140  K 3.1* 3.3* 3.3* 3.5 3.2*  CL 96* 96* 96* 96* 93*  CO2 27 28 27 28  32  GLUCOSE 106* 104* 96  98 103*  BUN 50* 51* 49* 50* 50*  CREATININE 4.78* 4.74* 4.69* 4.58* 4.39*  CALCIUM  9.0 9.1 9.2 9.3 9.4  MG  --  1.8  --   --  1.9   Liver Function Tests: No results for input(s): AST, ALT, ALKPHOS, BILITOT, PROT, ALBUMIN in the last 168 hours. CBG: Recent Labs  Lab 05/02/24 1156 05/02/24 1549 05/02/24 2148 05/03/24 0610 05/03/24 1111  GLUCAP 158* 144* 135* 118* 141*    Discharge time spent: greater than 30 minutes.  Author: Yetta Blanch, MD  [facility] 05/03/2024

## 2024-05-04 NOTE — Transitions of Care (Post Inpatient/ED Visit) (Signed)
   05/04/2024  Name: Keith Leblanc MRN: [id number] DOB: 1966-04-28  Today's TOC FU Call Status: Today's TOC FU Call Status:: Successful TOC FU Call Completed TOC FU Call Complete Date: 05/04/24  Patient's Name and Date of Birth confirmed. Name, DOB  Transition Care Management Follow-up Telephone Call Date of Discharge: 05/03/24 Discharge Facility: [facility]) Type of Discharge: Inpatient Admission Primary Inpatient Discharge Diagnosis:: Acute on chronic combined systolic and diastolic CHF How have you been since you were released from the hospital?: Better (slept well) Any questions or concerns?: No  Items Reviewed: Did you receive and understand the discharge instructions provided?: Yes Medications obtained,verified, and reconciled?: Yes (Medications Reviewed) (verbally states that he has all his medications but declined review.  reports dischrarge nurse spent 30 minutes reviewing yesterday prior to discharge.) Any new allergies since your discharge?: No Dietary orders reviewed?: Yes Type of Diet Ordered:: low salt diet  Medications Reviewed Today: verbally states that he has his medications- declined review.  Medications Reviewed Today   Medications were not reviewed in this encounter     Home Care and Equipment/Supplies:    Functional Questionnaire:    Follow up appointments reviewed: PCP Follow-up appointment confirmed?: Yes Date of PCP follow-up appointment?: 05/06/24 Follow-up Provider: PCP Specialist Hospital Follow-up appointment confirmed?: Yes Date of Specialist follow-up appointment?: 05/10/24 Follow-Up Specialty Provider:: Morene Ruffing Do you need transportation to your follow-up appointment?: No Do you understand care options if your condition(s) worsen?: Yes-patient verbalized understanding  SDOH Interventions Today    Flowsheet Row Most Recent Value  SDOH Interventions   Food Insecurity Interventions Intervention Not Indicated  Housing  Interventions Intervention Not Indicated  Transportation Interventions Intervention Not Indicated  Utilities Interventions Intervention Not Indicated   Phone call with patient who reports that he is thankful to be at home. Reports that he is doing well and sleep well last night.  States discharge nurse spent atleast 30 minutes with him reviewing instructions. Reports that he does not need to review again. States he has hospital follow up planned with PCP this week and cardiology next week.   Reviewed heart failure zones and when to call MD. Reviewed importance of low salt diet and reason for this. Reviewed importance of daily weights. Provided my contact name and phone number if patient needs assistance.     Alan Ee, RN, BSN, CEN [employer]- Transition of Care Team.  [facility] [phone]

## 2024-05-06 ENCOUNTER — Ambulatory Visit: Payer: Self-pay | Admitting: Adult Health

## 2024-05-06 ENCOUNTER — Encounter: Payer: Self-pay | Admitting: Adult Health

## 2024-05-06 ENCOUNTER — Telehealth: Payer: Self-pay

## 2024-05-06 ENCOUNTER — Ambulatory Visit (INDEPENDENT_AMBULATORY_CARE_PROVIDER_SITE_OTHER): Admitting: Adult Health

## 2024-05-06 VITALS — BP 108/80 | HR 77 | Temp 98.3°F | Ht 77.0 in | Wt 219.0 lb

## 2024-05-06 DIAGNOSIS — R7989 Other specified abnormal findings of blood chemistry: Secondary | ICD-10-CM

## 2024-05-06 DIAGNOSIS — N179 Acute kidney failure, unspecified: Secondary | ICD-10-CM | POA: Diagnosis not present

## 2024-05-06 DIAGNOSIS — I5033 Acute on chronic diastolic (congestive) heart failure: Secondary | ICD-10-CM | POA: Diagnosis not present

## 2024-05-06 DIAGNOSIS — E782 Mixed hyperlipidemia: Secondary | ICD-10-CM

## 2024-05-06 DIAGNOSIS — D62 Acute posthemorrhagic anemia: Secondary | ICD-10-CM

## 2024-05-06 DIAGNOSIS — N184 Chronic kidney disease, stage 4 (severe): Secondary | ICD-10-CM

## 2024-05-06 DIAGNOSIS — F32A Depression, unspecified: Secondary | ICD-10-CM

## 2024-05-06 DIAGNOSIS — R410 Disorientation, unspecified: Secondary | ICD-10-CM

## 2024-05-06 DIAGNOSIS — F5101 Primary insomnia: Secondary | ICD-10-CM

## 2024-05-06 DIAGNOSIS — I4811 Longstanding persistent atrial fibrillation: Secondary | ICD-10-CM

## 2024-05-06 LAB — BASIC METABOLIC PANEL WITH GFR
BUN: 54 mg/dL — ABNORMAL HIGH (ref 6–23)
CO2: 31 meq/L (ref 19–32)
Calcium: 10.4 mg/dL (ref 8.4–10.5)
Chloride: 93 meq/L — ABNORMAL LOW (ref 96–112)
Creatinine, Ser: 4.39 mg/dL — ABNORMAL HIGH (ref 0.40–1.50)
GFR: 14.28 mL/min — CL (ref >60.00)
Glucose, Bld: 104 mg/dL — ABNORMAL HIGH (ref 70–99)
Potassium: 4.2 meq/L (ref 3.5–5.1)
Sodium: 140 meq/L (ref 135–145)

## 2024-05-06 LAB — CBC
HCT: 33.4 % — ABNORMAL LOW (ref 39.0–52.0)
Hemoglobin: 10.7 g/dL — ABNORMAL LOW (ref 13.0–17.0)
MCHC: 32 g/dL (ref 30.0–36.0)
MCV: 84.4 fl (ref 78.0–100.0)
Platelets: 490 K/uL — ABNORMAL HIGH (ref 150.0–400.0)
RBC: 3.96 Mil/uL — ABNORMAL LOW (ref 4.22–5.81)
RDW: 19.2 % — ABNORMAL HIGH (ref 11.5–15.5)
WBC: 5.3 K/uL (ref 4.0–10.5)

## 2024-05-06 LAB — IBC + FERRITIN
Ferritin: 386.4 ng/mL — ABNORMAL HIGH (ref 22.0–322.0)
Iron: 68 ug/dL (ref 42–165)
Saturation Ratios: 17.4 % — ABNORMAL LOW (ref 20.0–50.0)
TIBC: 390.6 ug/dL (ref 250.0–450.0)
Transferrin: 279 mg/dL (ref 212.0–360.0)

## 2024-05-06 NOTE — Progress Notes (Signed)
 Subjective:    Patient ID: Keith Leblanc, male    DOB: July 02, 1965, 58 y.o.   MRN: [id number]  HPI 58 year old male who  has a past medical history of Blood in stool, Chicken pox, Diabetes mellitus (HCC), Erectile dysfunction, Gout, Hypertension, and Stroke (HCC).   He presents to the office today for hospital follow up.  Admit Date 04/17/2024 Discharge Date 05/03/2024   He had presented to the emergency room with complaints of shortness of breath progressively worsening over the last week.  Additionally new is on Eliquis  for A-fib and noticed bright red blood in his stool.  His initial lab work in the emergency room his initial lab work in the emergency room showed a bicarb of 21, creatinine 3.1 his baseline around 3, WBC 8.0, hemoglobin 7.5 with his baseline around 12, troponin 59 and 54, INR 1.6.  He had a chest x-ray which showed no acute changes.  He was admitted for further workup due to acute anemia and reported blood loss.   Hospital Course   Acute on chronic CHF  - EF in August 2025 was 45 to 50% and had reduced to 43% during this hospital admission.  RV mildly reduced-previously endomyocardial biopsy negative for cardiac amyloid diagnosis suspect HCM.  He received IV Lasix  along with midodrine due to some hypotension his creatinine started to trend up - He had a right heart cath on 04/23/2024 with RV dysfunction status post central line and started on dobutamine drop and high dose lasix . He was given a few doses of metolazone - He was transitioned to torsemide. - Dr. Cherrie discussed with [facility] transplant about referral for heart and kidney transplant and they will see him as an outpatient   AKI on CKD stage IV - Likely multifactorial suspecting AKI on CKD due to GI bleeding, severe anemia, hypotension, RV dysfunction/CHF and need for diuretics.  Appeared to be cardiorenal syndrome - Admitted to heart and kidney transplant at [facility] He/nephrology following closely and will  monitor renal function, replace electrolytes and monitor intake output.   Acute blood loss anemia due to upper GI bleed - History of LA grade a reflux esophagitis and gastritis seen on EGD in 2019 - Status post EGD on 04/19/2024 which showed congested and friable gastric mucosa likely source of bleeding - Colonoscopy on 04/22/2024 showed internal hemorrhoids likely the cause of hematochezia which was not thought to account patient's drop in hemoglobin - Received 3 units of PRBC.  Hemoglobin holding greater than 8 status post iron infusion - Continue to monitor hemoglobin.  Eliquis  was resumed on 04/24/2024 after GI clearance and he was continued on PPI   Chronic depression/insomnia/delirium - Developed delirium during this hospital admission and is having trouble sleeping.  Placed on trazodone as needed and was continued on home bupropion    Elevated troponin - Due to demand ischemia.  No plan for intervention per cardiology   Hyperlipidemia - He was continued on Crestor    Persistent A-fib - Eliquis  resumed 04/24/2024   Medication changes included stopping bisoprolol  10 mg daily, Jardiance  10 mg daily furosemide  20 mg, omeprazole  40 mg and potassium chloride  10 mEq.     Today he reports that he is doing well overall but it is hard to wrap his mind around everything that happened during his hospital.  He does feel as though he is in good spirits.  He denies shortness of breath or chest pain.  He does have an appointment coming up early next week with  the advanced heart failure clinic and has infusion scheduled to increase his hemoglobin.  He has not heard from [facility] transplant just yet. His weight has stayed stable since he was discharged; at home this morning he was 212 lbs.    Review of Systems  Constitutional: Negative.   HENT: Negative.    Eyes: Negative.   Respiratory: Negative.    Cardiovascular: Negative.   Gastrointestinal: Negative.   Genitourinary: Negative.   Musculoskeletal:  Negative.   Skin: Negative.   Allergic/Immunologic: Negative.   Neurological: Negative.   Psychiatric/Behavioral: Negative.     Past Medical History:  Diagnosis Date   Blood in stool    bright red blood    Chicken pox    Diabetes mellitus (HCC)    Erectile dysfunction    Gout    Hypertension    Stroke [facility])     Social History   Socioeconomic History   Marital status: Single    Spouse name: Not on file   Number of children: Not on file   Years of education: Not on file   Highest education level: Not on file  Occupational History   Not on file  Tobacco Use   Smoking status: Former    Current packs/day: 0.00    Types: Cigarettes, Cigars    Quit date: 03/10/2024    Years since quitting: 0.1   Smokeless tobacco: Never  Vaping Use   Vaping status: Never Used  Substance and Sexual Activity   Alcohol use: Not Currently   Drug use: Not Currently    Types: Marijuana   Sexual activity: Yes  Other Topics Concern   Not on file  Social History Narrative   Works for [employer]    Not married    One daughter who does not live with him    Likes to gamble, walk in the parks, travel.    Social Drivers of Corporate Investment Banker Strain: Not on file  Food Insecurity: No Food Insecurity (05/04/2024)   Hunger Vital Sign    Worried About Running Out of Food in the Last Year: Never true    Ran Out of Food in the Last Year: Never true  Transportation Needs: No Transportation Needs (05/04/2024)   PRAPARE - Administrator, Civil Service (Medical): No    Lack of Transportation (Non-Medical): No  Physical Activity: Not on file  Stress: Not on file  Social Connections: Not on file  Intimate Partner Violence: Unknown (05/04/2024)   Humiliation, Afraid, Rape, and Kick questionnaire    Fear of Current or Ex-Partner: No    Emotionally Abused: No    Physically Abused: No    Sexually Abused: Not on file    Past Surgical History:  Procedure Laterality Date   APPENDECTOMY   2004   COLONOSCOPY     COLONOSCOPY N/A 04/22/2024   Procedure: COLONOSCOPY;  Surgeon: Stacia Glendia BRAVO, MD;  Location: [facility] ENDOSCOPY;  Service: Gastroenterology;  Laterality: N/A;   ENDOMYOCARDIAL BIOPSY N/A 03/16/2024   Procedure: ENDOMYOCARDIAL BIOPSY;  Surgeon: Zenaida Morene PARAS, MD;  Location: [facility] INVASIVE CV LAB;  Service: Cardiovascular;  Laterality: N/A;   ESOPHAGOGASTRODUODENOSCOPY N/A 04/19/2024   Procedure: EGD (ESOPHAGOGASTRODUODENOSCOPY);  Surgeon: Stacia Glendia BRAVO, MD;  Location: [facility] ENDOSCOPY;  Service: Gastroenterology;  Laterality: N/A;   RIGHT HEART CATH N/A 03/16/2024   Procedure: RIGHT HEART CATH;  Surgeon: Zenaida Morene PARAS, MD;  Location: [facility] INVASIVE CV LAB;  Service: Cardiovascular;  Laterality: N/A;   RIGHT HEART CATH  N/A 04/23/2024   Procedure: RIGHT HEART CATH;  Surgeon: Rolan Ezra RAMAN, MD;  Location: [place] INVASIVE CV LAB;  Service: Cardiovascular;  Laterality: N/A;   TOOTH EXTRACTION  07/06/2020    Family History  Problem Relation Age of Onset   Alcohol abuse Father    Hypertension Father    Heart disease Father    Gout Father    Breast cancer Mother    Hypertension Mother    Gout Mother    Healthy Sister    Healthy Daughter    Colon cancer Neg Hx    Esophageal cancer Neg Hx    Rectal cancer Neg Hx    Stomach cancer Neg Hx     No Known Allergies  Current Outpatient Medications on File Prior to Visit  Medication Sig Dispense Refill   blood glucose meter kit and supplies Dispense based on patient and insurance preference. Use up to four times daily as directed. (FOR ICD-9 250.00, 250.01). 1 each 0   buPROPion  (WELLBUTRIN  XL) 300 MG 24 hr tablet TAKE 1 TABLET BY MOUTH ONCE  DAILY 30 tablet 11   colchicine  0.6 MG tablet TAKE 1 TABLET BY MOUTH DAILY  WHEN HAVING FLARES TAKE 1 TABLET BY MOUTH TWICE DAILY (Patient taking differently: Take 0.6 mg by mouth as needed. TAKE 1 TABLET BY MOUTH  DAILY WHEN HAVING FLARES  TAKE 1 TABLET BY MOUTH  TWICE DAILY) 180  tablet 3   ELIQUIS  5 MG TABS tablet TAKE 1 TABLET BY MOUTH TWICE  DAILY 180 tablet 3   famotidine  (PEPCID ) 20 MG tablet Take 1 tablet (20 mg total) by mouth daily. 30 tablet 1   glucose blood (ONETOUCH ULTRA TEST) test strip Use to check blood sugar 2-3 times a day. 300 each 12   hydrOXYzine (VISTARIL) 50 MG capsule Take 1 capsule (50 mg total) by mouth every 8 (eight) hours as needed. (Patient taking differently: Take 50 mg by mouth every 8 (eight) hours as needed for itching.) 15 capsule 0   Insulin  Pen Needle (B-D UF III MINI PEN NEEDLES) 31G X 5 MM MISC USE 4 TIMES DAILY AFTER MEALS AND AT BEDTIME 100 each 11   Lancets (ONETOUCH ULTRASOFT) lancets Test blood sugars as directed. Dx E11.9 100 each 12   magnesium  oxide (MAG-OX) 400 (240 Mg) MG tablet TAKE 1 TABLET(400 MG) BY MOUTH DAILY 30 tablet 6   midodrine (PROAMATINE) 5 MG tablet Take 3 tablets (15 mg total) by mouth 3 (three) times daily with meals. 300 tablet 0   Multiple Vitamins-Minerals (ONE-A-DAY MENS 50+) TABS Take 1 tablet by mouth daily with breakfast.     oxybutynin (DITROPAN) 5 MG tablet Take 1 tablet (5 mg total) by mouth every 8 (eight) hours as needed for bladder spasms. 15 tablet 0   pantoprazole  (PROTONIX ) 40 MG tablet Take 1 tablet (40 mg total) by mouth daily. 60 tablet 0   potassium chloride  SA (KLOR-CON  M) 20 MEQ tablet Take 2 tablets (40 mEq total) by mouth 2 (two) times daily. 60 tablet 0   rosuvastatin  (CRESTOR ) 20 MG tablet Take 1 tablet (20 mg total) by mouth daily. 90 tablet 3   thiamine  (VITAMIN B-1) 100 MG tablet Take 1 tablet (100 mg total) by mouth daily. 30 tablet 0   torsemide (DEMADEX) 20 MG tablet Take 3 tablets (60 mg total) by mouth 2 (two) times daily. 300 tablet 0   triamcinolone cream (KENALOG) 0.5 % Apply 1 Application topically 2 (two) times daily. 90 g 0  No current facility-administered medications on file prior to visit.    BP 108/80   Pulse 77   Temp 98.3 F (36.8 C) (Oral)   Ht 6' 5  (1.956 m)   Wt 219 lb (99.3 kg)   SpO2 96%   BMI 25.97 kg/m       Objective:   Physical Exam Vitals and nursing note reviewed.  Constitutional:      Appearance: Normal appearance. He is obese.  Cardiovascular:     Rate and Rhythm: Normal rate and regular rhythm.     Pulses: Normal pulses.     Heart sounds: Normal heart sounds.  Pulmonary:     Effort: Pulmonary effort is normal.     Breath sounds: Normal breath sounds.  Skin:    General: Skin is warm and dry.  Neurological:     General: No focal deficit present.     Mental Status: He is alert and oriented to person, place, and time.  Psychiatric:        Mood and Affect: Mood normal.        Behavior: Behavior normal.        Thought Content: Thought content normal.        Judgment: Judgment normal.        Assessment & Plan:  1. Acute on chronic diastolic CHF (congestive heart failure) (HCC) (Primary) Reviewed hospital notes, discharge instructions, labs, imaging, and medication changes with the patient.  All questions answered to the best of my ability - CBC; Future - Basic metabolic panel with GFR; Future - IBC + Ferritin; Future - IBC + Ferritin - Basic metabolic panel with GFR - CBC   2. AKI (acute kidney injury) - Will recheck BMP today.  - CBC; Future - Basic metabolic panel with GFR; Future - IBC + Ferritin; Future - IBC + Ferritin - Basic metabolic panel with GFR - CBC   3. Chronic kidney disease (CKD), stage IV (severe) (HCC) - Follow up with Nephrology as directed - CBC; Future - Basic metabolic panel with GFR; Future - IBC + Ferritin; Future - IBC + Ferritin - Basic metabolic panel with GFR - CBC   4. Acute on chronic blood loss anemia   - CBC; Future - Basic metabolic panel with GFR; Future - IBC + Ferritin; Future - IBC + Ferritin - Basic metabolic panel with GFR - CBC   5. Chronic depression - Continue with Wellbutrin . He seems to be in good spirits   6. Primary insomnia - Continue  with Trazodone as needed   7. Acute delirium - Resolved   8. Elevated troponin -    9. Mixed hyperlipidemia - Continue with statin    10. Longstanding persistent atrial fibrillation (HCC) - Continue with Eliquis     Darleene Shape, NP

## 2024-05-06 NOTE — Patient Instructions (Signed)
 It was great seeing you today   We will follow up with you regarding your lab work   Please let me know if you need anything

## 2024-05-06 NOTE — Telephone Encounter (Signed)
 CRITICAL VALUE STICKER  CRITICAL VALUE: GFR 14.28  DATE & TIME NOTIFIED: 05/06/24  MESSENGER (representative from lab): Kristine

## 2024-05-06 NOTE — Progress Notes (Deleted)
 Subjective:    Patient ID: Keith Leblanc, male    DOB: [birth year], 58 y.o.   MRN: [id number]  HPI 58 year old male who  has a past medical history of Blood in stool, Chicken pox, Diabetes mellitus (HCC), Erectile dysfunction, Gout, Hypertension, and Stroke (HCC).  He presents to the office today for hospital follow up.  Admit Date 04/17/2024 Discharge Date 05/03/2024  He had presented to the emergency room with complaints of shortness of breath progressively worsening over the last week.  Additionally new is on Eliquis  for A-fib and noticed bright red blood in his stool.  His initial lab work in the emergency room his initial lab work in the emergency room showed a bicarb of 21, creatinine 3.1 his baseline around 3, WBC 8.0, hemoglobin 7.5 with his baseline around 12, troponin 59 and 54, INR 1.6.  He had a chest x-ray which showed no acute changes.  He was admitted for further workup due to acute anemia and reported blood loss.  Hospital Course  Acute on chronic CHF  - EF in August 2025 was 45 to 50% and had reduced to 43% during this hospital admission.  RV mildly reduced-previously endomyocardial biopsy negative for cardiac amyloid diagnosis suspect HCM.  He received IV Lasix  along with midodrine due to some hypotension his creatinine started to trend up - He had a right heart cath on 04/23/2024 with RV dysfunction status post central line and started on dobutamine drop and high dose lasix . He was given a few doses of metolazone - He was transitioned to torsemide. - Dr. Cherrie discussed with [facility] transplant about referral for heart and kidney transplant and they will see him as an outpatient  AKI on CKD stage IV - Likely multifactorial suspecting AKI on CKD due to GI bleeding, severe anemia, hypotension, RV dysfunction/CHF and need for diuretics.  Appeared to be cardiorenal syndrome - Admitted to heart and kidney transplant at [facility]. He/nephrology following closely and will monitor  renal function, replace electrolytes and monitor intake output.  Acute blood loss anemia due to upper GI bleed - History of LA grade a reflux esophagitis and gastritis seen on EGD in 2019 - Status post EGD on 04/19/2024 which showed congested and friable gastric mucosa likely source of bleeding - Colonoscopy on 04/22/2024 showed internal hemorrhoids likely the cause of hematochezia which was not thought to account patient's drop in hemoglobin - Received 3 units of PRBC.  Hemoglobin holding greater than 8 status post iron infusion - Continue to monitor hemoglobin.  Eliquis  was resumed on 04/24/2024 after GI clearance and he was continued on PPI  Chronic depression/insomnia/delirium - Developed delirium during this hospital admission and is having trouble sleeping.  Placed on trazodone as needed and was continued on home bupropion   Elevated troponin - Due to demand ischemia.  No plan for intervention per cardiology  Hyperlipidemia - He was continued on Crestor   Persistent A-fib - Eliquis  resumed 04/24/2024  Medication changes included stopping bisoprolol  10 mg daily, Jardiance  10 mg daily furosemide  20 mg, omeprazole  40 mg and potassium chloride  10 mEq.   Today he reports that he is doing well overall but it is hard to wrap his mind around everything that happened during his hospital.  He does feel as though he is in good spirits.  He denies shortness of breath or chest pain.  He does have an appointment coming up early next week with the advanced heart failure clinic and has infusion scheduled to increase his hemoglobin.  He has not heard from [facility] transplant just yet. His weight has stayed stable since he was discharged; at home this morning he was 212 lbs.     Review of Systems  Constitutional: Negative.   HENT: Negative.    Respiratory: Negative.    Cardiovascular: Negative.   Gastrointestinal: Negative.   Genitourinary: Negative.   Musculoskeletal: Negative.   Skin: Negative.    Neurological: Negative.   Hematological: Negative.   Psychiatric/Behavioral: Negative.       Past Medical History:  Diagnosis Date   Blood in stool    bright red blood    Chicken pox    Diabetes mellitus (HCC)    Erectile dysfunction    Gout    Hypertension    Stroke [facility])     Social History   Socioeconomic History   Marital status: Single    Spouse name: Not on file   Number of children: Not on file   Years of education: Not on file   Highest education level: Not on file  Occupational History   Not on file  Tobacco Use   Smoking status: Former    Current packs/day: 0.00    Types: Cigarettes, Cigars    Quit date: 03/10/2024    Years since quitting: 0.1   Smokeless tobacco: Never  Vaping Use   Vaping status: Never Used  Substance and Sexual Activity   Alcohol use: Not Currently   Drug use: Not Currently    Types: Marijuana   Sexual activity: Yes  Other Topics Concern   Not on file  Social History Narrative   Works for [employer]    Not married    One daughter who does not live with him    Likes to gamble, walk in the parks, travel.    Social Drivers of Corporate Investment Banker Strain: Not on file  Food Insecurity: No Food Insecurity (05/04/2024)   Hunger Vital Sign    Worried About Running Out of Food in the Last Year: Never true    Ran Out of Food in the Last Year: Never true  Transportation Needs: No Transportation Needs (05/04/2024)   PRAPARE - Administrator, Civil Service (Medical): No    Lack of Transportation (Non-Medical): No  Physical Activity: Not on file  Stress: Not on file  Social Connections: Not on file  Intimate Partner Violence: Unknown (05/04/2024)   Humiliation, Afraid, Rape, and Kick questionnaire    Fear of Current or Ex-Partner: No    Emotionally Abused: No    Physically Abused: No    Sexually Abused: Not on file    Past Surgical History:  Procedure Laterality Date   APPENDECTOMY  2004   COLONOSCOPY      COLONOSCOPY N/A 04/22/2024   Procedure: COLONOSCOPY;  Surgeon: Stacia Glendia BRAVO, MD;  Location: [facility] ENDOSCOPY;  Service: Gastroenterology;  Laterality: N/A;   ENDOMYOCARDIAL BIOPSY N/A 03/16/2024   Procedure: ENDOMYOCARDIAL BIOPSY;  Surgeon: Zenaida Morene PARAS, MD;  Location: [facility] INVASIVE CV LAB;  Service: Cardiovascular;  Laterality: N/A;   ESOPHAGOGASTRODUODENOSCOPY N/A 04/19/2024   Procedure: EGD (ESOPHAGOGASTRODUODENOSCOPY);  Surgeon: Stacia Glendia BRAVO, MD;  Location: [facility] ENDOSCOPY;  Service: Gastroenterology;  Laterality: N/A;   RIGHT HEART CATH N/A 03/16/2024   Procedure: RIGHT HEART CATH;  Surgeon: Zenaida Morene PARAS, MD;  Location: [facility] INVASIVE CV LAB;  Service: Cardiovascular;  Laterality: N/A;   RIGHT HEART CATH N/A 04/23/2024   Procedure: RIGHT HEART CATH;  Surgeon: Rolan Ezra RAMAN, MD;  Location: [facility] INVASIVE CV LAB;  Service: Cardiovascular;  Laterality: N/A;   TOOTH EXTRACTION  07/06/2020    Family History  Problem Relation Age of Onset   Alcohol abuse Father    Hypertension Father    Heart disease Father    Gout Father    Breast cancer Mother    Hypertension Mother    Gout Mother    Healthy Sister    Healthy Daughter    Colon cancer Neg Hx    Esophageal cancer Neg Hx    Rectal cancer Neg Hx    Stomach cancer Neg Hx     No Known Allergies  Current Outpatient Medications on File Prior to Visit  Medication Sig Dispense Refill   blood glucose meter kit and supplies Dispense based on patient and insurance preference. Use up to four times daily as directed. (FOR ICD-9 250.00, 250.01). 1 each 0   buPROPion  (WELLBUTRIN  XL) 300 MG 24 hr tablet TAKE 1 TABLET BY MOUTH ONCE  DAILY 30 tablet 11   colchicine  0.6 MG tablet TAKE 1 TABLET BY MOUTH DAILY  WHEN HAVING FLARES TAKE 1 TABLET BY MOUTH TWICE DAILY (Patient taking differently: Take 0.6 mg by mouth as needed. TAKE 1 TABLET BY MOUTH  DAILY WHEN HAVING FLARES  TAKE 1 TABLET BY MOUTH  TWICE DAILY) 180 tablet 3   ELIQUIS  5 MG  TABS tablet TAKE 1 TABLET BY MOUTH TWICE  DAILY 180 tablet 3   famotidine  (PEPCID ) 20 MG tablet Take 1 tablet (20 mg total) by mouth daily. 30 tablet 1   glucose blood (ONETOUCH ULTRA TEST) test strip Use to check blood sugar 2-3 times a day. 300 each 12   hydrOXYzine (VISTARIL) 50 MG capsule Take 1 capsule (50 mg total) by mouth every 8 (eight) hours as needed. (Patient taking differently: Take 50 mg by mouth every 8 (eight) hours as needed for itching.) 15 capsule 0   Insulin  Pen Needle (B-D UF III MINI PEN NEEDLES) 31G X 5 MM MISC USE 4 TIMES DAILY AFTER MEALS AND AT BEDTIME 100 each 11   Lancets (ONETOUCH ULTRASOFT) lancets Test blood sugars as directed. Dx E11.9 100 each 12   magnesium  oxide (MAG-OX) 400 (240 Mg) MG tablet TAKE 1 TABLET(400 MG) BY MOUTH DAILY 30 tablet 6   midodrine (PROAMATINE) 5 MG tablet Take 3 tablets (15 mg total) by mouth 3 (three) times daily with meals. 300 tablet 0   Multiple Vitamins-Minerals (ONE-A-DAY MENS 50+) TABS Take 1 tablet by mouth daily with breakfast.     oxybutynin (DITROPAN) 5 MG tablet Take 1 tablet (5 mg total) by mouth every 8 (eight) hours as needed for bladder spasms. 15 tablet 0   pantoprazole  (PROTONIX ) 40 MG tablet Take 1 tablet (40 mg total) by mouth daily. 60 tablet 0   potassium chloride  SA (KLOR-CON  M) 20 MEQ tablet Take 2 tablets (40 mEq total) by mouth 2 (two) times daily. 60 tablet 0   rosuvastatin  (CRESTOR ) 20 MG tablet Take 1 tablet (20 mg total) by mouth daily. 90 tablet 3   thiamine  (VITAMIN B-1) 100 MG tablet Take 1 tablet (100 mg total) by mouth daily. 30 tablet 0   torsemide (DEMADEX) 20 MG tablet Take 3 tablets (60 mg total) by mouth 2 (two) times daily. 300 tablet 0   triamcinolone cream (KENALOG) 0.5 % Apply 1 Application topically 2 (two) times daily. 90 g 0   No current facility-administered medications on file prior to visit.    BP  108/80   Pulse 77   Temp 98.3 F (36.8 C) (Oral)   Ht 6' 5 (1.956 m)   Wt 219 lb (99.3  kg)   SpO2 96%   BMI 25.97 kg/m       Objective:   Physical Exam Vitals and nursing note reviewed.  Constitutional:      Appearance: Normal appearance.  Cardiovascular:     Rate and Rhythm: Normal rate and regular rhythm.     Pulses: Normal pulses.     Heart sounds: Normal heart sounds.  Pulmonary:     Effort: Pulmonary effort is normal.     Breath sounds: Normal breath sounds.  Skin:    General: Skin is warm and dry.  Neurological:     General: No focal deficit present.     Mental Status: He is alert and oriented to person, place, and time.  Psychiatric:        Mood and Affect: Mood normal.        Behavior: Behavior normal.        Thought Content: Thought content normal.        Judgment: Judgment normal.        Assessment & Plan:  1. Acute on chronic diastolic CHF (congestive heart failure) (HCC) (Primary) Reviewed hospital notes, discharge instructions, labs, imaging, and medication changes with the patient.  All questions answered to the best of my ability - CBC; Future - Basic metabolic panel with GFR; Future - IBC + Ferritin; Future - IBC + Ferritin - Basic metabolic panel with GFR - CBC  2. AKI (acute kidney injury) - Will recheck BMP today.  - CBC; Future - Basic metabolic panel with GFR; Future - IBC + Ferritin; Future - IBC + Ferritin - Basic metabolic panel with GFR - CBC  3. Chronic kidney disease (CKD), stage IV (severe) (HCC) - Follow up with Nephrology as directed - CBC; Future - Basic metabolic panel with GFR; Future - IBC + Ferritin; Future - IBC + Ferritin - Basic metabolic panel with GFR - CBC  4. Acute on chronic blood loss anemia  - CBC; Future - Basic metabolic panel with GFR; Future - IBC + Ferritin; Future - IBC + Ferritin - Basic metabolic panel with GFR - CBC  5. Chronic depression - Continue with Wellbutrin . He seems to be in good spirits  6. Primary insomnia - Continue with Trazodone as needed  7. Acute delirium -  Resolved  8. Elevated troponin -   9. Mixed hyperlipidemia - Continue with statin   10. Longstanding persistent atrial fibrillation (HCC) - Continue with Eliquis    Darleene Shape, NP

## 2024-05-07 ENCOUNTER — Telehealth (HOSPITAL_COMMUNITY): Payer: Self-pay | Admitting: Cardiology

## 2024-05-07 NOTE — Telephone Encounter (Signed)
 Called to confirm/remind patient of their appointment at the [facility] on 05/07/2024.   Appointment:   [] Confirmed  [x] Left mess   [] No answer/No voice mail  [] VM Full/unable to leave message  [] Phone not in service  Patient reminded to bring all medications and/or complete list.  Confirmed patient has transportation. Gave directions, instructed to utilize valet parking.

## 2024-05-08 NOTE — Progress Notes (Signed)
   ADVANCED HEART FAILURE FOLLOW UP CLINIC NOTE  Referring Physician: [name], NP  Primary Care: [name], NP Primary Cardiologist:  HPI: Keith Leblanc is a 58 y.o. male who presents for follow up of end stage diastolic heart failrue.      Has had cardiac workup recently with ongoing SOB and constant congestion. Echo 8/25 showed EF 45-50% (drop from 60-65% 5/24) with severe LVH. PYP pursued which was not consistent with amyloid.    Admitted 9/25 with acute HFmrEF. Volume overloaded on admission, diuresed well with IV lasix , down 40lbs at discharge. LHC deferred with CKD. RHC with elevated filling pressures and severely reduced index. cMRI with LVEF 43%, GHK, mod concentric hypertrophy, LVEF 47%, normal L/R atrial size, severe dilatation of PA, 35mm. LGE pattern consistent with cardiac amyloidosis. Underwent cardiac biopsy. GDMT limited by CKD at discharge.   Underwent CPX with severely reduced VO2max of 7.57mL/kg/min  Readmitted for worsening HF symptoms. Was volume overloaded with BiV failure. Required Dobutamine and IV diuresis, also underwent workup for GI bleed. Discharged off inotropes with plan for follow up with [facility] for consideration of heart/kidney txp.      SUBJECTIVE:  No issues, patient is overall doing better since discharge. He is starting to walk more.   PMH, current medications, allergies, social history, and family history reviewed in epic.  PHYSICAL EXAM: There were no vitals filed for this visit. GENERAL: Well nourished and in no apparent distress at rest.  PULM:  Normal work of breathing, clear to auscultation bilaterally. Respirations are unlabored.  CARDIAC:  JVP: ***         Normal rate with regular rhythm. No murmurs, rubs or gallops.  *** edema. Warm and well perfused extremities. ABDOMEN: Soft, non-tender, non-distended. NEUROLOGIC: Patient is oriented x3 with no focal or lateralizing neurologic deficits.    DATA REVIEW  ECG: ***     ECHO: ***   CATH: ***   ASSESSMENT & PLAN:    I spent 50 minutes caring for this patient today including face to face time, ordering and reviewing labs, reviewing records from multiple hospital visits, cardiac biopsy, CPX testing, discussing advanced therapies, seeing the patient, documenting in the record, and arranging follow ups.   Follow up in 3 months  Morene Brownie, MD Advanced Heart Failure Mechanical Circulatory Support 05/08/24

## 2024-05-10 ENCOUNTER — Ambulatory Visit (HOSPITAL_COMMUNITY)
Admit: 2024-05-10 | Discharge: 2024-05-10 | Disposition: A | Discharge status: Home / Self Care | Attending: Cardiology | Admitting: Cardiology

## 2024-05-10 ENCOUNTER — Ambulatory Visit (INDEPENDENT_AMBULATORY_CARE_PROVIDER_SITE_OTHER)

## 2024-05-10 ENCOUNTER — Encounter (HOSPITAL_COMMUNITY): Payer: Self-pay | Admitting: Cardiology

## 2024-05-10 VITALS — BP 104/72 | HR 72 | Temp 98.8°F | Resp 18 | Ht 77.0 in | Wt 223.8 lb

## 2024-05-10 VITALS — BP 110/78 | HR 62 | Wt 222.2 lb

## 2024-05-10 DIAGNOSIS — I5042 Chronic combined systolic (congestive) and diastolic (congestive) heart failure: Secondary | ICD-10-CM | POA: Diagnosis not present

## 2024-05-10 DIAGNOSIS — D509 Iron deficiency anemia, unspecified: Secondary | ICD-10-CM | POA: Diagnosis not present

## 2024-05-10 DIAGNOSIS — N184 Chronic kidney disease, stage 4 (severe): Secondary | ICD-10-CM | POA: Insufficient documentation

## 2024-05-10 DIAGNOSIS — I5043 Acute on chronic combined systolic (congestive) and diastolic (congestive) heart failure: Secondary | ICD-10-CM | POA: Diagnosis not present

## 2024-05-10 DIAGNOSIS — I130 Hypertensive heart and chronic kidney disease with heart failure and stage 1 through stage 4 chronic kidney disease, or unspecified chronic kidney disease: Secondary | ICD-10-CM | POA: Diagnosis not present

## 2024-05-10 DIAGNOSIS — I5032 Chronic diastolic (congestive) heart failure: Secondary | ICD-10-CM

## 2024-05-10 DIAGNOSIS — I5022 Chronic systolic (congestive) heart failure: Secondary | ICD-10-CM

## 2024-05-10 DIAGNOSIS — Z7901 Long term (current) use of anticoagulants: Secondary | ICD-10-CM | POA: Diagnosis not present

## 2024-05-10 DIAGNOSIS — I480 Paroxysmal atrial fibrillation: Secondary | ICD-10-CM | POA: Diagnosis not present

## 2024-05-10 DIAGNOSIS — I425 Other restrictive cardiomyopathy: Secondary | ICD-10-CM | POA: Diagnosis not present

## 2024-05-10 DIAGNOSIS — I5040 Unspecified combined systolic (congestive) and diastolic (congestive) heart failure: Secondary | ICD-10-CM | POA: Diagnosis not present

## 2024-05-10 DIAGNOSIS — N179 Acute kidney failure, unspecified: Secondary | ICD-10-CM

## 2024-05-10 DIAGNOSIS — Z79899 Other long term (current) drug therapy: Secondary | ICD-10-CM | POA: Insufficient documentation

## 2024-05-10 MED ORDER — IRON SUCROSE 20 MG/ML IV SOLN: 200.0000 mg | Freq: Once | INTRAVENOUS | Status: DC

## 2024-05-10 MED ORDER — IRON SUCROSE 200 MG IVPB - SIMPLE MED
200.0000 mg | Freq: Once | Status: AC
  Administered 2024-05-10: 200 mg via INTRAVENOUS
  Filled 2024-05-10: qty 110

## 2024-05-10 NOTE — Patient Instructions (Signed)
 There has been no changes to your medications.  You have been referred to [facility]. They will call you to arrange your appointment.  Your physician recommends that you schedule a follow-up appointment in: 2 months ( January) ** PLEASE CALL THE OFFICE IN 3 WEEKS TO ARRANGE YOUR FOLLOW UP APPOINTMENT.**  If you have any questions or concerns before your next appointment please send us  a message through [facility] or call our office at [phone].    TO LEAVE A MESSAGE FOR THE NURSE SELECT OPTION 2, PLEASE LEAVE A MESSAGE INCLUDING: YOUR NAME DATE OF BIRTH CALL BACK NUMBER REASON FOR CALL**this is important as we prioritize the call backs  YOU WILL RECEIVE A CALL BACK THE SAME DAY AS LONG AS YOU CALL BEFORE 4:00 PM  At the [facility], you and your health needs are our priority. As part of our continuing mission to provide you with exceptional heart care, we have created designated Provider Care Teams. These Care Teams include your primary Cardiologist (physician) and Advanced Practice Providers (APPs- Physician Assistants and Nurse Practitioners) who all work together to provide you with the care you need, when you need it.   You may see any of the following providers on your designated Care Team at your next follow up: Dr Toribio Fuel Dr Ezra Shuck Dr. Morene Brownie Greig Mosses, NP Caffie Shed, [place] [facility] [place], [place] Beckey Coe, NP Jordan Lee, NP Ellouise Class, NP Tinnie Redman, PharmD Jaun Bash, PharmD   Please be sure to bring in all your medications bottles to every appointment.    Thank you for choosing [facility]

## 2024-05-10 NOTE — Progress Notes (Signed)
 Diagnosis: Acute Anemia  Provider:  Lonna Coder MD  Procedure: IV Infusion  IV Type: Peripheral, IV Location: R Forearm  Venofer (Iron Sucrose), Dose: 200 mg  Infusion Start Time: 1414  Infusion Stop Time: 1431  Post Infusion IV Care: Patient declined observation and Peripheral IV Discontinued  Discharge: Condition: Good, Destination: Home . AVS Provided  Performed by:  Donny Childes, RN

## 2024-05-11 ENCOUNTER — Telehealth: Payer: Self-pay | Admitting: *Deleted

## 2024-05-11 NOTE — Telephone Encounter (Signed)
 Copied from CRM [id number]. Topic: General - Other >> May 11, 2024  9:03 AM Keith Leblanc wrote: Reason for CRM: Patient is calling requesting a return to work note for today as soon as possible. He would like it uploaded to his MyChart.

## 2024-05-12 ENCOUNTER — Ambulatory Visit

## 2024-05-12 ENCOUNTER — Other Ambulatory Visit (HOSPITAL_COMMUNITY): Payer: Self-pay | Admitting: Cardiology

## 2024-05-12 VITALS — BP 104/68 | HR 73 | Temp 98.6°F | Resp 16 | Ht 77.0 in | Wt 222.8 lb

## 2024-05-12 DIAGNOSIS — I5043 Acute on chronic combined systolic (congestive) and diastolic (congestive) heart failure: Secondary | ICD-10-CM | POA: Diagnosis not present

## 2024-05-12 DIAGNOSIS — N179 Acute kidney failure, unspecified: Secondary | ICD-10-CM

## 2024-05-12 DIAGNOSIS — D631 Anemia in chronic kidney disease: Secondary | ICD-10-CM | POA: Diagnosis not present

## 2024-05-12 DIAGNOSIS — N184 Chronic kidney disease, stage 4 (severe): Secondary | ICD-10-CM

## 2024-05-12 DIAGNOSIS — D509 Iron deficiency anemia, unspecified: Secondary | ICD-10-CM

## 2024-05-12 MED ORDER — IRON SUCROSE 200 MG IVPB - SIMPLE MED
200.0000 mg | Freq: Once | Status: AC
  Administered 2024-05-12: 200 mg via INTRAVENOUS
  Filled 2024-05-12: qty 110

## 2024-05-12 NOTE — Progress Notes (Signed)
 Diagnosis: Iron  Deficiency Anemia  Provider:  Praveen Mannam MD  Procedure: IV Infusion  IV Type: Peripheral, IV Location: R Antecubital  Venofer  (Iron  Sucrose), Dose: 200 mg  Infusion Start Time: 1528  Infusion Stop Time: 1548  Post Infusion IV Care: Patient declined observation and Peripheral IV Discontinued  Discharge: Condition: Good, Destination: Home . AVS Declined  Performed by:  Maximiano JONELLE Pouch, LPN

## 2024-05-14 ENCOUNTER — Ambulatory Visit (INDEPENDENT_AMBULATORY_CARE_PROVIDER_SITE_OTHER)

## 2024-05-14 VITALS — BP 111/77 | HR 71 | Temp 98.1°F | Resp 16 | Ht 77.0 in | Wt 223.2 lb

## 2024-05-14 DIAGNOSIS — I5043 Acute on chronic combined systolic (congestive) and diastolic (congestive) heart failure: Secondary | ICD-10-CM

## 2024-05-14 DIAGNOSIS — N179 Acute kidney failure, unspecified: Secondary | ICD-10-CM

## 2024-05-14 DIAGNOSIS — D509 Iron deficiency anemia, unspecified: Secondary | ICD-10-CM

## 2024-05-14 DIAGNOSIS — N184 Chronic kidney disease, stage 4 (severe): Secondary | ICD-10-CM | POA: Diagnosis not present

## 2024-05-14 MED ORDER — IRON SUCROSE 200 MG IVPB - SIMPLE MED: 200.0000 mg | Freq: Once | Status: DC

## 2024-05-14 MED ORDER — SODIUM CHLORIDE 0.9 % IV SOLN
200.0000 mg | Freq: Once | INTRAVENOUS | Status: AC
  Administered 2024-05-14: 200 mg via INTRAVENOUS
  Filled 2024-05-14: qty 10

## 2024-05-14 NOTE — Progress Notes (Signed)
 Diagnosis: Iron  Deficiency Anemia  Provider:  Praveen Mannam MD  Procedure: IV Infusion  IV Type: Peripheral, IV Location: L Forearm  Venofer  (Iron  Sucrose), Dose: 200 mg  Infusion Start Time: 1529  Infusion Stop Time: 1545  Post Infusion IV Care: Patient declined observation and Peripheral IV Discontinued  Discharge: Condition: Good, Destination: Home . AVS Declined  Performed by:  Maximiano JONELLE Pouch, LPN

## 2024-05-17 ENCOUNTER — Ambulatory Visit

## 2024-05-17 VITALS — BP 133/85 | HR 71 | Temp 98.5°F | Resp 18 | Ht 77.0 in | Wt 228.8 lb

## 2024-05-17 DIAGNOSIS — I509 Heart failure, unspecified: Secondary | ICD-10-CM | POA: Diagnosis not present

## 2024-05-17 DIAGNOSIS — I1310 Hypertensive heart and chronic kidney disease without heart failure, with stage 1 through stage 4 chronic kidney disease, or unspecified chronic kidney disease: Secondary | ICD-10-CM | POA: Diagnosis not present

## 2024-05-17 DIAGNOSIS — N185 Chronic kidney disease, stage 5: Secondary | ICD-10-CM | POA: Diagnosis not present

## 2024-05-17 DIAGNOSIS — N184 Chronic kidney disease, stage 4 (severe): Secondary | ICD-10-CM

## 2024-05-17 DIAGNOSIS — I5043 Acute on chronic combined systolic (congestive) and diastolic (congestive) heart failure: Secondary | ICD-10-CM | POA: Diagnosis not present

## 2024-05-17 DIAGNOSIS — I959 Hypotension, unspecified: Secondary | ICD-10-CM | POA: Diagnosis not present

## 2024-05-17 DIAGNOSIS — D509 Iron deficiency anemia, unspecified: Secondary | ICD-10-CM

## 2024-05-17 DIAGNOSIS — N179 Acute kidney failure, unspecified: Secondary | ICD-10-CM

## 2024-05-17 MED ORDER — SODIUM CHLORIDE 0.9 % IV SOLN
200.0000 mg | Freq: Once | INTRAVENOUS | Status: AC
  Administered 2024-05-17: 200 mg via INTRAVENOUS
  Filled 2024-05-17: qty 10

## 2024-05-17 NOTE — Progress Notes (Signed)
 Diagnosis: Iron  Deficiency Anemia  Provider:  Praveen Mannam MD  Procedure: IV Infusion  IV Type: Peripheral, IV Location: L Hand   Venofer  (Iron  Sucrose), Dose: 200 mg  Infusion Start Time: 1550  Infusion Stop Time: 1610  Post Infusion IV Care: PIV Discontinued  Discharge: Condition: Good, Destination: Home . AVS Declined  Performed by:  Eleanor DELENA Bloch, RN

## 2024-05-18 ENCOUNTER — Other Ambulatory Visit (HOSPITAL_COMMUNITY): Payer: Self-pay | Admitting: Cardiology

## 2024-05-18 MED ORDER — POTASSIUM CHLORIDE CRYS ER 20 MEQ PO TBCR: 40.0000 meq | EXTENDED_RELEASE_TABLET | Freq: Two times a day (BID) | ORAL | 3 refills | Status: DC

## 2024-05-18 MED ORDER — OXYBUTYNIN CHLORIDE 5 MG PO TABS: 5.0000 mg | ORAL_TABLET | Freq: Three times a day (TID) | ORAL | 0 refills | Status: AC | PRN

## 2024-05-18 MED ORDER — FAMOTIDINE 20 MG PO TABS: 20.0000 mg | ORAL_TABLET | Freq: Every day | ORAL | 0 refills | Status: DC

## 2024-05-18 MED ORDER — PANTOPRAZOLE SODIUM 40 MG PO TBEC: 40.0000 mg | DELAYED_RELEASE_TABLET | Freq: Every day | ORAL | 0 refills | Status: AC

## 2024-05-24 DIAGNOSIS — E119 Type 2 diabetes mellitus without complications: Secondary | ICD-10-CM | POA: Diagnosis not present

## 2024-06-02 NOTE — Addendum Note (Signed)
 Addended by: DAYNE SHERRY RAMAN on: 06/02/2024 03:45 PM   Modules accepted: Orders

## 2024-06-11 DIAGNOSIS — N184 Chronic kidney disease, stage 4 (severe): Secondary | ICD-10-CM | POA: Diagnosis not present

## 2024-06-11 DIAGNOSIS — N185 Chronic kidney disease, stage 5: Secondary | ICD-10-CM | POA: Diagnosis not present

## 2024-06-11 DIAGNOSIS — I959 Hypotension, unspecified: Secondary | ICD-10-CM | POA: Diagnosis not present

## 2024-06-11 DIAGNOSIS — I509 Heart failure, unspecified: Secondary | ICD-10-CM | POA: Diagnosis not present

## 2024-06-11 DIAGNOSIS — I1310 Hypertensive heart and chronic kidney disease without heart failure, with stage 1 through stage 4 chronic kidney disease, or unspecified chronic kidney disease: Secondary | ICD-10-CM | POA: Diagnosis not present

## 2024-06-11 LAB — LAB REPORT - SCANNED: PTH, Intact: 70

## 2024-06-14 ENCOUNTER — Inpatient Hospital Stay (HOSPITAL_COMMUNITY)
Admission: EM | Admit: 2024-06-14 | Discharge: 2024-06-23 | DRG: 286 | Disposition: A | Discharge status: Other Acute Inpatient Hospital | Attending: Critical Care Medicine | Admitting: Critical Care Medicine

## 2024-06-14 ENCOUNTER — Other Ambulatory Visit: Payer: Self-pay

## 2024-06-14 ENCOUNTER — Emergency Department (HOSPITAL_COMMUNITY)

## 2024-06-14 ENCOUNTER — Encounter (HOSPITAL_COMMUNITY): Payer: Self-pay

## 2024-06-14 ENCOUNTER — Other Ambulatory Visit: Payer: Self-pay | Admitting: Adult Health

## 2024-06-14 DIAGNOSIS — M79641 Pain in right hand: Secondary | ICD-10-CM | POA: Diagnosis not present

## 2024-06-14 DIAGNOSIS — R570 Cardiogenic shock: Secondary | ICD-10-CM | POA: Diagnosis present

## 2024-06-14 DIAGNOSIS — E871 Hypo-osmolality and hyponatremia: Secondary | ICD-10-CM | POA: Diagnosis present

## 2024-06-14 DIAGNOSIS — K2100 Gastro-esophageal reflux disease with esophagitis, without bleeding: Secondary | ICD-10-CM | POA: Diagnosis present

## 2024-06-14 DIAGNOSIS — G4700 Insomnia, unspecified: Secondary | ICD-10-CM | POA: Diagnosis present

## 2024-06-14 DIAGNOSIS — N179 Acute kidney failure, unspecified: Secondary | ICD-10-CM | POA: Diagnosis present

## 2024-06-14 DIAGNOSIS — Z8249 Family history of ischemic heart disease and other diseases of the circulatory system: Secondary | ICD-10-CM

## 2024-06-14 DIAGNOSIS — M109 Gout, unspecified: Secondary | ICD-10-CM | POA: Diagnosis not present

## 2024-06-14 DIAGNOSIS — E1165 Type 2 diabetes mellitus with hyperglycemia: Secondary | ICD-10-CM | POA: Diagnosis present

## 2024-06-14 DIAGNOSIS — I43 Cardiomyopathy in diseases classified elsewhere: Secondary | ICD-10-CM | POA: Diagnosis present

## 2024-06-14 DIAGNOSIS — I509 Heart failure, unspecified: Secondary | ICD-10-CM

## 2024-06-14 DIAGNOSIS — I130 Hypertensive heart and chronic kidney disease with heart failure and stage 1 through stage 4 chronic kidney disease, or unspecified chronic kidney disease: Secondary | ICD-10-CM | POA: Diagnosis not present

## 2024-06-14 DIAGNOSIS — R0602 Shortness of breath: Secondary | ICD-10-CM | POA: Diagnosis not present

## 2024-06-14 DIAGNOSIS — F32A Depression, unspecified: Secondary | ICD-10-CM | POA: Diagnosis present

## 2024-06-14 DIAGNOSIS — N186 End stage renal disease: Secondary | ICD-10-CM | POA: Diagnosis present

## 2024-06-14 DIAGNOSIS — M1A40X0 Other secondary chronic gout, unspecified site, without tophus (tophi): Secondary | ICD-10-CM | POA: Diagnosis not present

## 2024-06-14 DIAGNOSIS — Z811 Family history of alcohol abuse and dependence: Secondary | ICD-10-CM

## 2024-06-14 DIAGNOSIS — I5082 Biventricular heart failure: Secondary | ICD-10-CM | POA: Diagnosis present

## 2024-06-14 DIAGNOSIS — N185 Chronic kidney disease, stage 5: Secondary | ICD-10-CM

## 2024-06-14 DIAGNOSIS — E1122 Type 2 diabetes mellitus with diabetic chronic kidney disease: Secondary | ICD-10-CM | POA: Diagnosis present

## 2024-06-14 DIAGNOSIS — E876 Hypokalemia: Secondary | ICD-10-CM | POA: Diagnosis present

## 2024-06-14 DIAGNOSIS — I132 Hypertensive heart and chronic kidney disease with heart failure and with stage 5 chronic kidney disease, or end stage renal disease: Secondary | ICD-10-CM | POA: Diagnosis present

## 2024-06-14 DIAGNOSIS — I5021 Acute systolic (congestive) heart failure: Secondary | ICD-10-CM | POA: Diagnosis not present

## 2024-06-14 DIAGNOSIS — I5023 Acute on chronic systolic (congestive) heart failure: Secondary | ICD-10-CM | POA: Diagnosis present

## 2024-06-14 DIAGNOSIS — Z1152 Encounter for screening for COVID-19: Secondary | ICD-10-CM

## 2024-06-14 DIAGNOSIS — F339 Major depressive disorder, recurrent, unspecified: Secondary | ICD-10-CM

## 2024-06-14 DIAGNOSIS — J90 Pleural effusion, not elsewhere classified: Secondary | ICD-10-CM | POA: Diagnosis present

## 2024-06-14 DIAGNOSIS — D631 Anemia in chronic kidney disease: Secondary | ICD-10-CM | POA: Diagnosis present

## 2024-06-14 DIAGNOSIS — R609 Edema, unspecified: Secondary | ICD-10-CM | POA: Diagnosis not present

## 2024-06-14 DIAGNOSIS — R739 Hyperglycemia, unspecified: Secondary | ICD-10-CM | POA: Diagnosis not present

## 2024-06-14 DIAGNOSIS — D649 Anemia, unspecified: Secondary | ICD-10-CM | POA: Diagnosis not present

## 2024-06-14 DIAGNOSIS — R5381 Other malaise: Secondary | ICD-10-CM | POA: Diagnosis present

## 2024-06-14 DIAGNOSIS — K5730 Diverticulosis of large intestine without perforation or abscess without bleeding: Secondary | ICD-10-CM | POA: Diagnosis present

## 2024-06-14 DIAGNOSIS — E785 Hyperlipidemia, unspecified: Secondary | ICD-10-CM | POA: Diagnosis present

## 2024-06-14 DIAGNOSIS — I429 Cardiomyopathy, unspecified: Secondary | ICD-10-CM | POA: Diagnosis not present

## 2024-06-14 DIAGNOSIS — I10 Essential (primary) hypertension: Secondary | ICD-10-CM | POA: Diagnosis not present

## 2024-06-14 DIAGNOSIS — Z743 Need for continuous supervision: Secondary | ICD-10-CM | POA: Diagnosis not present

## 2024-06-14 DIAGNOSIS — M1A49X0 Other secondary chronic gout, multiple sites, without tophus (tophi): Secondary | ICD-10-CM | POA: Diagnosis not present

## 2024-06-14 DIAGNOSIS — J918 Pleural effusion in other conditions classified elsewhere: Secondary | ICD-10-CM | POA: Diagnosis present

## 2024-06-14 DIAGNOSIS — Z992 Dependence on renal dialysis: Secondary | ICD-10-CM

## 2024-06-14 DIAGNOSIS — K2970 Gastritis, unspecified, without bleeding: Secondary | ICD-10-CM | POA: Diagnosis present

## 2024-06-14 DIAGNOSIS — I4819 Other persistent atrial fibrillation: Secondary | ICD-10-CM | POA: Diagnosis present

## 2024-06-14 DIAGNOSIS — Z803 Family history of malignant neoplasm of breast: Secondary | ICD-10-CM

## 2024-06-14 DIAGNOSIS — I425 Other restrictive cardiomyopathy: Secondary | ICD-10-CM | POA: Diagnosis present

## 2024-06-14 DIAGNOSIS — I9589 Other hypotension: Secondary | ICD-10-CM | POA: Diagnosis present

## 2024-06-14 DIAGNOSIS — Z87891 Personal history of nicotine dependence: Secondary | ICD-10-CM

## 2024-06-14 DIAGNOSIS — Z452 Encounter for adjustment and management of vascular access device: Secondary | ICD-10-CM | POA: Diagnosis not present

## 2024-06-14 DIAGNOSIS — N184 Chronic kidney disease, stage 4 (severe): Secondary | ICD-10-CM | POA: Diagnosis not present

## 2024-06-14 DIAGNOSIS — Z79899 Other long term (current) drug therapy: Secondary | ICD-10-CM

## 2024-06-14 DIAGNOSIS — Z794 Long term (current) use of insulin: Secondary | ICD-10-CM | POA: Diagnosis not present

## 2024-06-14 DIAGNOSIS — Z720 Tobacco use: Secondary | ICD-10-CM | POA: Diagnosis not present

## 2024-06-14 DIAGNOSIS — I5043 Acute on chronic combined systolic (congestive) and diastolic (congestive) heart failure: Secondary | ICD-10-CM | POA: Diagnosis not present

## 2024-06-14 DIAGNOSIS — I959 Hypotension, unspecified: Secondary | ICD-10-CM | POA: Diagnosis not present

## 2024-06-14 DIAGNOSIS — Z7901 Long term (current) use of anticoagulants: Secondary | ICD-10-CM | POA: Diagnosis not present

## 2024-06-14 DIAGNOSIS — R0600 Dyspnea, unspecified: Secondary | ICD-10-CM | POA: Diagnosis present

## 2024-06-14 DIAGNOSIS — Z7682 Awaiting organ transplant status: Secondary | ICD-10-CM

## 2024-06-14 DIAGNOSIS — E669 Obesity, unspecified: Secondary | ICD-10-CM | POA: Diagnosis present

## 2024-06-14 DIAGNOSIS — I5084 End stage heart failure: Secondary | ICD-10-CM | POA: Diagnosis present

## 2024-06-14 DIAGNOSIS — Z4682 Encounter for fitting and adjustment of non-vascular catheter: Secondary | ICD-10-CM | POA: Diagnosis not present

## 2024-06-14 DIAGNOSIS — I482 Chronic atrial fibrillation, unspecified: Secondary | ICD-10-CM | POA: Diagnosis not present

## 2024-06-14 DIAGNOSIS — I480 Paroxysmal atrial fibrillation: Secondary | ICD-10-CM | POA: Diagnosis present

## 2024-06-14 DIAGNOSIS — E854 Organ-limited amyloidosis: Secondary | ICD-10-CM | POA: Diagnosis present

## 2024-06-14 DIAGNOSIS — I1310 Hypertensive heart and chronic kidney disease without heart failure, with stage 1 through stage 4 chronic kidney disease, or unspecified chronic kidney disease: Secondary | ICD-10-CM | POA: Diagnosis present

## 2024-06-14 DIAGNOSIS — J9811 Atelectasis: Secondary | ICD-10-CM | POA: Diagnosis not present

## 2024-06-14 DIAGNOSIS — Z8673 Personal history of transient ischemic attack (TIA), and cerebral infarction without residual deficits: Secondary | ICD-10-CM

## 2024-06-14 HISTORY — PX: IR THORACENTESIS ASP PLEURAL SPACE W/IMG GUIDE: IMG5380

## 2024-06-14 LAB — CBC WITH DIFFERENTIAL/PLATELET
Abs Immature Granulocytes: 0.01 K/uL (ref 0.00–0.07)
Basophils Absolute: 0.1 K/uL (ref 0.0–0.1)
Basophils Relative: 1 %
Eosinophils Absolute: 0.1 K/uL (ref 0.0–0.5)
Eosinophils Relative: 2 %
HCT: 33.3 % — ABNORMAL LOW (ref 39.0–52.0)
Hemoglobin: 10.1 g/dL — ABNORMAL LOW (ref 13.0–17.0)
Immature Granulocytes: 0 %
Lymphocytes Relative: 34 %
Lymphs Abs: 1.9 K/uL (ref 0.7–4.0)
MCH: 28.2 pg (ref 26.0–34.0)
MCHC: 30.3 g/dL (ref 30.0–36.0)
MCV: 93 fL (ref 80.0–100.0)
Monocytes Absolute: 0.4 K/uL (ref 0.1–1.0)
Monocytes Relative: 8 %
Neutro Abs: 2.9 K/uL (ref 1.7–7.7)
Neutrophils Relative %: 55 %
Platelets: 433 K/uL — ABNORMAL HIGH (ref 150–400)
RBC: 3.58 MIL/uL — ABNORMAL LOW (ref 4.22–5.81)
RDW: 19.3 % — ABNORMAL HIGH (ref 11.5–15.5)
WBC: 5.4 K/uL (ref 4.0–10.5)
nRBC: 0 % (ref 0.0–0.2)

## 2024-06-14 LAB — COMPREHENSIVE METABOLIC PANEL WITH GFR
ALT: 40 U/L (ref 0–44)
AST: 33 U/L (ref 15–41)
Albumin: 4.1 g/dL (ref 3.5–5.0)
Alkaline Phosphatase: 152 U/L — ABNORMAL HIGH (ref 38–126)
Anion gap: 12 (ref 5–15)
BUN: 50 mg/dL — ABNORMAL HIGH (ref 6–20)
CO2: 28 mmol/L (ref 22–32)
Calcium: 9.9 mg/dL (ref 8.9–10.3)
Chloride: 100 mmol/L (ref 98–111)
Creatinine, Ser: 3.06 mg/dL — ABNORMAL HIGH (ref 0.61–1.24)
GFR, Estimated: 23 mL/min — ABNORMAL LOW
Glucose, Bld: 78 mg/dL (ref 70–99)
Potassium: 4.4 mmol/L (ref 3.5–5.1)
Sodium: 139 mmol/L (ref 135–145)
Total Bilirubin: 0.9 mg/dL (ref 0.0–1.2)
Total Protein: 7.4 g/dL (ref 6.5–8.1)

## 2024-06-14 LAB — PRO BRAIN NATRIURETIC PEPTIDE: Pro Brain Natriuretic Peptide: 8130 pg/mL — ABNORMAL HIGH

## 2024-06-14 LAB — RESP PANEL BY RT-PCR (RSV, FLU A&B, COVID)  RVPGX2
Influenza A by PCR: NEGATIVE
Influenza B by PCR: NEGATIVE
Resp Syncytial Virus by PCR: NEGATIVE
SARS Coronavirus 2 by RT PCR: NEGATIVE

## 2024-06-14 MED ORDER — OXYBUTYNIN CHLORIDE 5 MG PO TABS: 5.0000 mg | ORAL_TABLET | Freq: Three times a day (TID) | ORAL | Status: DC | PRN

## 2024-06-14 MED ORDER — LIDOCAINE-EPINEPHRINE 1 %-1:100000 IJ SOLN
INTRAMUSCULAR | Status: AC
  Filled 2024-06-14: qty 20

## 2024-06-14 MED ORDER — ACETAMINOPHEN 325 MG PO TABS
650.0000 mg | ORAL_TABLET | Freq: Four times a day (QID) | ORAL | Status: DC | PRN
  Administered 2024-06-16 – 2024-06-21 (×3): 650 mg via ORAL
  Filled 2024-06-14 (×2): qty 2

## 2024-06-14 MED ORDER — APIXABAN 5 MG PO TABS
5.0000 mg | ORAL_TABLET | Freq: Two times a day (BID) | ORAL | Status: DC
  Administered 2024-06-14 – 2024-06-23 (×18): 5 mg via ORAL
  Filled 2024-06-14 (×13): qty 1

## 2024-06-14 MED ORDER — DOCUSATE SODIUM 100 MG PO CAPS
100.0000 mg | ORAL_CAPSULE | Freq: Two times a day (BID) | ORAL | Status: DC
  Administered 2024-06-15 – 2024-06-22 (×5): 100 mg via ORAL
  Filled 2024-06-14 (×7): qty 1

## 2024-06-14 MED ORDER — LIDOCAINE-EPINEPHRINE 1 %-1:100000 IJ SOLN
20.0000 mL | Freq: Once | INTRAMUSCULAR | Status: AC
  Administered 2024-06-14: 10 mL

## 2024-06-14 MED ORDER — PANTOPRAZOLE SODIUM 40 MG PO TBEC
40.0000 mg | DELAYED_RELEASE_TABLET | Freq: Every day | ORAL | Status: DC
  Administered 2024-06-15 – 2024-06-23 (×9): 40 mg via ORAL
  Filled 2024-06-14 (×7): qty 1

## 2024-06-14 MED ORDER — SODIUM CHLORIDE 0.9% FLUSH: 3.0000 mL | INTRAVENOUS | Status: DC | PRN

## 2024-06-14 MED ORDER — BUPROPION HCL ER (XL) 150 MG PO TB24
300.0000 mg | ORAL_TABLET | Freq: Every day | ORAL | Status: DC
  Administered 2024-06-14 – 2024-06-23 (×10): 300 mg via ORAL
  Filled 2024-06-14 (×7): qty 2

## 2024-06-14 MED ORDER — ONDANSETRON HCL 4 MG/2ML IJ SOLN: 4.0000 mg | Freq: Four times a day (QID) | INTRAMUSCULAR | Status: DC | PRN

## 2024-06-14 MED ORDER — ROSUVASTATIN CALCIUM 20 MG PO TABS
20.0000 mg | ORAL_TABLET | Freq: Every day | ORAL | Status: DC
  Administered 2024-06-15 – 2024-06-17 (×3): 20 mg via ORAL
  Filled 2024-06-14 (×3): qty 1

## 2024-06-14 MED ORDER — MIDODRINE HCL 5 MG PO TABS
15.0000 mg | ORAL_TABLET | Freq: Three times a day (TID) | ORAL | Status: DC
  Administered 2024-06-14 – 2024-06-18 (×12): 15 mg via ORAL
  Filled 2024-06-14 (×12): qty 3

## 2024-06-14 MED ORDER — SODIUM CHLORIDE 0.9 % IV SOLN: 250.0000 mL | INTRAVENOUS | Status: AC | PRN

## 2024-06-14 MED ORDER — ACETAMINOPHEN 650 MG RE SUPP: 650.0000 mg | Freq: Four times a day (QID) | RECTAL | Status: DC | PRN

## 2024-06-14 MED ORDER — FUROSEMIDE 10 MG/ML IJ SOLN
100.0000 mg | Freq: Once | INTRAVENOUS | Status: AC
  Administered 2024-06-14: 100 mg via INTRAVENOUS
  Filled 2024-06-14: qty 10

## 2024-06-14 MED ORDER — ONDANSETRON HCL 4 MG PO TABS: 4.0000 mg | ORAL_TABLET | Freq: Four times a day (QID) | ORAL | Status: DC | PRN

## 2024-06-14 MED ORDER — SODIUM CHLORIDE 0.9% FLUSH
3.0000 mL | Freq: Two times a day (BID) | INTRAVENOUS | Status: DC
  Administered 2024-06-14 – 2024-06-17 (×5): 3 mL via INTRAVENOUS

## 2024-06-14 MED ORDER — FAMOTIDINE 20 MG PO TABS
20.0000 mg | ORAL_TABLET | Freq: Every day | ORAL | Status: DC
  Administered 2024-06-14 – 2024-06-20 (×7): 20 mg via ORAL
  Filled 2024-06-14 (×7): qty 1

## 2024-06-14 MED ORDER — SENNA 8.6 MG PO TABS
1.0000 | ORAL_TABLET | Freq: Two times a day (BID) | ORAL | Status: DC
  Administered 2024-06-15 – 2024-06-22 (×6): 8.6 mg via ORAL
  Filled 2024-06-14 (×8): qty 1

## 2024-06-14 NOTE — Consult Note (Addendum)
 "   Advanced Heart Failure Team Consult Note  Primary Physician: [name], NP AHF Cardiologist:  Dr. Zenaida HPI:    Keith Leblanc is seen today for evaluation of shortness of breath at the request of Dr. Darra.   Keith Leblanc is a 58 y.o. male with end stage diastolic heart failure, former heavy drinker, type 2DM, HTN, CKD IIIa, HLD, history of stroke, and PAF on eliquis .   Has had cardiac workup recently with ongoing SOB and constant congestion. Echo 8/25 showed EF 45-50% (drop from 60-65% 5/24) with severe LVH. PYP pursued which was not consistent with amyloid.    Admitted 9/25 with acute HFmrEF. Volume overloaded on admission, diuresed well with IV lasix , down 40lbs at discharge. LHC deferred with CKD. RHC with elevated filling pressures and severely reduced index. cMRI with LVEF 43%, GHK, mod concentric hypertrophy, LVEF 47%, normal L/R atrial size, severe dilatation of PA, 35mm. LGE pattern consistent with cardiac amyloidosis. Underwent cardiac biopsy. GDMT limited by CKD at discharge.    Underwent CPX with severely reduced VO107max of 7.66mL/kg/min   Readmitted for worsening HF symptoms. Was volume overloaded with BiV failure. Required Dobutamine  and IV diuresis, also underwent workup for GI bleed. Discharged off inotropes with plan for follow up with [facility] for consideration of heart/kidney txp.   He presented to the ED today with progressive SOB. Reports symptoms on and off for a couple of weeks. Some days would be better than others. Initially he was going to come Saturday but ended up feeling better the next day. This morning he ultimately same in with worsening SOB and trouble sleeping. Reports that he felt like he wasn't getting oxygen like normal.   Since being in the ED he underwent R thoracentesis with -1.3L amber fluid. Weight up ~5lbs from last AHF visit. SCr 3.06. Pro-BNP 8K. Breathing much better since thoracentesis. Hesitant to stay as he has not eaten or drank  anything and does not see why he needs to stay. Denies CP. SOB much improved. Reports compliance with all meds at home.    Home Medications Prior to Admission medications  Medication Sig Start Date End Date Taking? Authorizing Provider  blood glucose meter kit and supplies Dispense based on patient and insurance preference. Use up to four times daily as directed. (FOR ICD-9 250.00, 250.01). 07/09/16   Sebastian Toribio GAILS, MD  buPROPion  (WELLBUTRIN  XL) 300 MG 24 hr tablet TAKE 1 TABLET BY MOUTH ONCE  DAILY 04/13/24   Nafziger, Huxley, NP  colchicine  0.6 MG tablet TAKE 1 TABLET BY MOUTH DAILY  WHEN HAVING FLARES TAKE 1 TABLET BY MOUTH TWICE DAILY 09/05/23   Nafziger, Huxley, NP  ELIQUIS  5 MG TABS tablet TAKE 1 TABLET BY MOUTH TWICE  DAILY 04/27/24   West, Katlyn D, NP  famotidine  (PEPCID ) 20 MG tablet Take 1 tablet (20 mg total) by mouth daily. 05/18/24 05/18/25  Rolan Ezra RAMAN, MD  glucose blood (ONETOUCH ULTRA TEST) test strip Use to check blood sugar 2-3 times a day. 03/01/24   Trixie File, MD  hydrOXYzine  (VISTARIL ) 50 MG capsule Take 1 capsule (50 mg total) by mouth every 8 (eight) hours as needed. 03/23/24   Nafziger, Huxley, NP  Insulin  Pen Needle (B-D UF III MINI PEN NEEDLES) 31G X 5 MM MISC USE 4 TIMES DAILY AFTER MEALS AND AT BEDTIME 02/24/20   Trixie File, MD  Lancets [facility] ULTRASOFT) lancets Test blood sugars as directed. Dx E11.9 08/06/16   [name], NP  magnesium  oxide (MAG-OX)  400 (240 Mg) MG tablet TAKE 1 TABLET(400 MG) BY MOUTH DAILY Patient not taking: Reported on 05/10/2024 02/12/24   West, Katlyn D, NP  midodrine  (PROAMATINE ) 5 MG tablet Take 3 tablets (15 mg total) by mouth 3 (three) times daily with meals. 05/03/24   Tobie Yetta HERO, MD  Multiple Vitamins-Minerals (ONE-A-DAY MENS 50+) TABS Take 1 tablet by mouth daily with breakfast.    [provider]  oxybutynin  (DITROPAN ) 5 MG tablet Take 1 tablet (5 mg total) by mouth every 8 (eight) hours as needed for  bladder spasms. 05/18/24   Rolan Ezra RAMAN, MD  pantoprazole  (PROTONIX ) 40 MG tablet Take 1 tablet (40 mg total) by mouth daily. 05/18/24   Rolan Ezra RAMAN, MD  potassium chloride  SA (KLOR-CON  M) 20 MEQ tablet Take 2 tablets (40 mEq total) by mouth 2 (two) times daily. 05/18/24   Rolan Ezra RAMAN, MD  rosuvastatin  (CRESTOR ) 20 MG tablet Take 1 tablet (20 mg total) by mouth daily. 04/08/24   Hayes Beckey CROME, NP  thiamine  (VITAMIN B-1) 100 MG tablet Take 1 tablet (100 mg total) by mouth daily. 11/11/22   Toberman, Stevi W, NP  torsemide  (DEMADEX ) 20 MG tablet Take 3 tablets (60 mg total) by mouth 2 (two) times daily. 05/03/24   Tobie Yetta HERO, MD  triamcinolone  cream (KENALOG ) 0.5 % Apply 1 Application topically as needed.    [provider]    Past Medical History: Past Medical History:  Diagnosis Date   Blood in stool    bright red blood    Chicken pox    Diabetes mellitus (HCC)    Erectile dysfunction    Gout    Hypertension    Stroke [facility])     Past Surgical History: Past Surgical History:  Procedure Laterality Date   APPENDECTOMY  2004   COLONOSCOPY     COLONOSCOPY N/A 04/22/2024   Procedure: COLONOSCOPY;  Surgeon: Stacia Glendia BRAVO, MD;  Location: [facility] ENDOSCOPY;  Service: Gastroenterology;  Laterality: N/A;   ENDOMYOCARDIAL BIOPSY N/A 03/16/2024   Procedure: ENDOMYOCARDIAL BIOPSY;  Surgeon: Zenaida Morene PARAS, MD;  Location: [facility] INVASIVE CV LAB;  Service: Cardiovascular;  Laterality: N/A;   ESOPHAGOGASTRODUODENOSCOPY N/A 04/19/2024   Procedure: EGD (ESOPHAGOGASTRODUODENOSCOPY);  Surgeon: Stacia Glendia BRAVO, MD;  Location: [facility] ENDOSCOPY;  Service: Gastroenterology;  Laterality: N/A;   RIGHT HEART CATH N/A 03/16/2024   Procedure: RIGHT HEART CATH;  Surgeon: Zenaida Morene PARAS, MD;  Location: [facility] INVASIVE CV LAB;  Service: Cardiovascular;  Laterality: N/A;   RIGHT HEART CATH N/A 04/23/2024   Procedure: RIGHT HEART CATH;  Surgeon: Rolan Ezra RAMAN, MD;  Location: [facility] INVASIVE  CV LAB;  Service: Cardiovascular;  Laterality: N/A;   TOOTH EXTRACTION  07/06/2020    Family History: Family History  Problem Relation Age of Onset   Alcohol abuse Father    Hypertension Father    Heart disease Father    Gout Father    Breast cancer Mother    Hypertension Mother    Gout Mother    Healthy Sister    Healthy Daughter    Colon cancer Neg Hx    Esophageal cancer Neg Hx    Rectal cancer Neg Hx    Stomach cancer Neg Hx     Social History: Social History   Socioeconomic History   Marital status: Single    Spouse name: Not on file   Number of children: Not on file   Years of education: Not on file   Highest education  level: Not on file  Occupational History   Not on file  Tobacco Use   Smoking status: Former    Current packs/day: 0.00    Types: Cigarettes, Cigars    Quit date: 03/10/2024    Years since quitting: 0.2   Smokeless tobacco: Never  Vaping Use   Vaping status: Never Used  Substance and Sexual Activity   Alcohol use: Not Currently   Drug use: Not Currently    Types: Marijuana   Sexual activity: Yes  Other Topics Concern   Not on file  Social History Narrative   Works for [employer]    Not married    One daughter who does not live with him    Likes to gamble, walk in the parks, travel.    Social Drivers of Health   Tobacco Use: Medium Risk (06/14/2024)   Patient History    Smoking Tobacco Use: Former    Smokeless Tobacco Use: Never    Passive Exposure: Not on file  Financial Resource Strain: Not on file  Food Insecurity: No Food Insecurity (05/04/2024)   Epic    Worried About Programme Researcher, Broadcasting/film/video in the Last Year: Never true    Ran Out of Food in the Last Year: Never true  Transportation Needs: No Transportation Needs (05/04/2024)   Epic    Lack of Transportation (Medical): No    Lack of Transportation (Non-Medical): No  Physical Activity: Not on file  Stress: Not on file  Social Connections: Not on file  Depression (EYV7-0): Low Risk  (03/23/2024)   Depression (PHQ2-9)    PHQ-2 Score: 0  Alcohol Screen: Not on file  Housing: Unknown (05/04/2024)   Epic    Unable to Pay for Housing in the Last Year: No    Number of Times Moved in the Last Year: Not on file    Homeless in the Last Year: No  Utilities: Not At Risk (05/04/2024)   Epic    Threatened with loss of utilities: No  Health Literacy: Not on file    Allergies:  Allergies[1]  Objective:   Vital Signs:   Temp:  [97.8 F (36.6 C)-97.9 F (36.6 C)] 97.8 F (36.6 C) (12/22 1600) Pulse Rate:  [70-80] 70 (12/22 1500) Resp:  [16-18] 16 (12/22 1500) BP: (105-127)/(78-95) 105/78 (12/22 1500) SpO2:  [100 %] 100 % (12/22 1500) Weight:  [103.8 kg] 103.8 kg (12/22 1149)    Weight change: Filed Weights   06/14/24 1149  Weight: 103.8 kg    Intake/Output:  No intake or output data in the 24 hours ending 06/14/24 1633  Physical Exam  General:  well appearing.   Cor: Regular rate & irregular rhythm. No murmurs. JVD ~11 cm.  Lungs: clear, diminished bases. Minimal lung sounds to RLL.  Extremities: +1 BLE edema  Telemetry   A fib 70s-80s, 0-2 PVCs/hr, intt NSVT (Personally reviewed)    EKG    A fib 70s  Labs   Basic Metabolic Panel: Recent Labs  Lab 06/14/24 1241  NA 139  K 4.4  CL 100  CO2 28  GLUCOSE 78  BUN 50*  CREATININE 3.06*  CALCIUM  9.9    Liver Function Tests: Recent Labs  Lab 06/14/24 1241  AST 33  ALT 40  ALKPHOS 152*  BILITOT 0.9  PROT 7.4  ALBUMIN  4.1   No results for input(s): LIPASE, AMYLASE in the last 168 hours. No results for input(s): AMMONIA in the last 168 hours.  CBC: Recent Labs  Lab 06/14/24  1241  WBC 5.4  NEUTROABS 2.9  HGB 10.1*  HCT 33.3*  MCV 93.0  PLT 433*    Cardiac Enzymes: No results for input(s): CKTOTAL, CKMB, CKMBINDEX, TROPONINI in the last 168 hours.  BNP: BNP (last 3 results) Recent Labs    03/24/24 1624 04/17/24 1704  BNP 1,093.8* 1,324.0*    ProBNP (last 3  results) Recent Labs    03/12/24 0942 06/14/24 1241  PROBNP 5,364.0* 8,130.0*     CBG: No results for input(s): GLUCAP in the last 168 hours.  Coagulation Studies: No results for input(s): LABPROT, INR in the last 72 hours.   Imaging   DG Chest 1 View Result Date: 06/14/2024 CLINICAL DATA:  Post right thoracentesis EXAM: CHEST  1 VIEW COMPARISON:  06/14/2024 FINDINGS: Left lung is grossly clear. Decreased right pleural effusion post thoracentesis without visible right pneumothorax. Small residual noted. Mild airspace disease right base but improved. Stable cardiomediastinal silhouette IMPRESSION: Decreased right pleural effusion post thoracentesis without visible pneumothorax. Small residual right effusion and mild right base airspace disease. Electronically Signed   By: Luke Bun M.D.   On: 06/14/2024 16:15   DG Chest 2 View Result Date: 06/14/2024 CLINICAL DATA:  Worsening shortness of breath for 1 week. EXAM: DG CHEST 2V COMPARISON:  04/23/2024 FINDINGS: The heart size and mediastinal contours are within normal limits. New moderate right pleural effusion. Right lower lung atelectasis. Left lung is clear. IMPRESSION: New moderate right pleural effusion and compressive atelectasis. Electronically Signed   By: Norleen DELENA Kil M.D.   On: 06/14/2024 12:54    Medications:   Current Medications:   Infusions:  Patient Profile   Jayme Cham is a 58 y.o. male with end stage diastolic heart failure, former heavy drinker, type 2DM, HTN, CKD IIIa, HLD, history of stroke, and PAF on eliquis . Admitted with SOB s/p thoracentesis.    Assessment/Plan  SOB - s/p thoracentesis 12/22. -1.3L fluid drained.  - SOB much improved, still diminished in bases  End stage combined systolic and diastolic heart failurer: Primarily restrictive cardiomyopathy with biventricular involvement. Severely reduced VO2, TD index. Workup included CMR, biopsy without evidence of cardiac amyloid. Biopsy  with hypertrophic nuclei, genetic testing ordered previously. - [facility] referral for txp workup has been sent - NYHA IV on admission.  - Volume mildly elevated on exam, has noticed his weight go up some. Give 100 IV lasix  x1. Follow response. (On Torsemide  60 mg BID at home).  - Continue midodrine  15mg  TID - Remainder of GDMT limited by renal failure and HF phenotype  Chronic AF - Rate controlled on tele - Duration unknown. Was in AF on all ECGs dating back to 10/24 (was in sinus in 10/24) - Continue Eliquis  5 mg BID  CKD stage IV - SCr 3 - Scheduled at [facility] in January for transplant referral - Follow with diuresis  HTN - BP stable, continue home midodrine   - Avoid hypotension    Length of Stay: 0  Beckey LITTIE Coe, NP  06/14/2024, 4:33 PM  Advanced Heart Failure Team Pager [phone] (M-F; 7a - 5p)   Please visit [email or web address]: For overnight coverage please call cardiology fellow first. If fellow not available call Shock/ECMO MD on call.  For ECMO / Mechanical Support (Impella, IABP, LVAD) issues call Shock / ECMO MD on call.     Patient seen and examined with the above-signed Advanced Practice Provider and/or Housestaff. I personally reviewed laboratory data, imaging studies and relevant notes. I independently examined the patient and formulated  the important aspects of the plan. I have edited the note to reflect any of my changes or salient points. I have personally discussed the plan with the patient and/or family.  58 y/o male as above with end-stage diastolic/RV failure and CKD IV undergoing potential heart/kidney transplant evalaution.   Presents to ED with volume overload  General:  Sitting up in bed. No resp difficulty HEENT: normal Neck: supple. JVP to ear with prominent v-waves Cor: Irregular rate & rhythm. No rubs, gallops or murmurs. Lungs: clear decreased at bases Abdomen: soft, nontender, + distended.Good bowel sounds. Extremities: no cyanosis, clubbing, rash, 2+  edema Neuro: alert & orientedx3, cranial nerves grossly intact. moves all 4 extremities w/o difficulty. Affect pleasant   Agree with admit for IV diuresis. Follow SCr closely.  Continue Eliquis  for AF. Continue midodrine  for BP support. Wrap legs  Toribio Fuel, MD  6:33 PM      [1] No Known Allergies  "

## 2024-06-14 NOTE — H&P (Addendum)
 " History and Physical    Keith Leblanc FMW:[id number] DOB: 15-Mar-1966 DOA: 06/14/2024  PCP: Keith Huxley, NP   Patient coming from: Home  I have personally briefly reviewed patient's old medical records in [facility]  Chief Complaint: Progressive shortness of breath for 2 weeks.  HPI: Keith Leblanc is a 58 y.o. male with PMH significant for end-stage diastolic heart failure, former heavy drinker, hypertension, type 2 diabetes, CKD stage IIIa, hyperlipidemia, history of stroke, paroxysmal A-fib on Eliquis  presented in the ED with progressive shortness of breath for 2 weeks.  Patient was admitted to [facility] from 04/17/2024 to 05/03/2024 for acute on chronic systolic CHF.  Patient has received dobutamine  infusion, high-dose Lasix  and metolazone  few doses.  LHC was deferred due to CKD.  RHC shows elevated filling pressures and severely reduced index.  GDMT was limited due to hypotension and renal failure.  Patient was discharged on torsemide .  Patient reports progressively worsening heart failure symptoms for 2 weeks.  Patient reports unable to lie flat on the bed,  has not been sleeping in the night due to trouble breathing.  He also reports worsening leg swelling. Today morning he has a feeling that he was not getting enough oxygen.  He denies any chest pain, palpitations, dizziness, recent cold, nausea, vomiting, diarrhea.  He decided to come to the ED.SABRA  ED Course: He was hemodynamically stable. Temp 97.9, HR 79, RR 16, BP 121/88, SpO2 100% on room air. Labs include sodium 139, potassium 4.4, chloride 100, bicarb 28, glucose 78, BUN 50, creatinine 3.06, calcium  9.9, anion gap 12, alkaline phosphate 152, albumin  4.1, AST 33, ALT 40, total protein 4.7, proBNP 8130, WBC 5.4, hemoglobin 10.1, hematocrit 33.3, MCV 93.0, platelet 439, RSV, influenza, COVID-negative. CXR: New moderate right pleural effusion and compressive atelectasis.   Patient underwent right sided thoracocentesis by IR,   1.3 L of clear fluid drained.  Patient tolerated procedure well.  Repeat chest x-ray no pneumothorax identified.  Patient seems much improved after getting thoracocentesis.  Review of Systems:  Review of Systems  Constitutional:  Positive for malaise/fatigue.  HENT: Negative.    Eyes: Negative.   Respiratory:  Positive for shortness of breath.   Cardiovascular:  Positive for orthopnea and leg swelling.  Gastrointestinal: Negative.   Genitourinary: Negative.   Musculoskeletal: Negative.   Skin: Negative.   Neurological: Negative.   Endo/Heme/Allergies: Negative.   Psychiatric/Behavioral: Negative.      Past Medical History:  Diagnosis Date   Blood in stool    bright red blood    Chicken pox    Diabetes mellitus (HCC)    Erectile dysfunction    Gout    Hypertension    Stroke [facility])     Past Surgical History:  Procedure Laterality Date   APPENDECTOMY  2004   COLONOSCOPY     COLONOSCOPY N/A 04/22/2024   Procedure: COLONOSCOPY;  Surgeon: Keith Leblanc BRAVO, MD;  Location: [facility] ENDOSCOPY;  Service: Gastroenterology;  Laterality: N/A;   ENDOMYOCARDIAL BIOPSY N/A 03/16/2024   Procedure: ENDOMYOCARDIAL BIOPSY;  Surgeon: Keith Keith PARAS, MD;  Location: [facility] INVASIVE CV LAB;  Service: Cardiovascular;  Laterality: N/A;   ESOPHAGOGASTRODUODENOSCOPY N/A 04/19/2024   Procedure: EGD (ESOPHAGOGASTRODUODENOSCOPY);  Surgeon: Keith Leblanc BRAVO, MD;  Location: [facility] ENDOSCOPY;  Service: Gastroenterology;  Laterality: N/A;   RIGHT HEART CATH N/A 03/16/2024   Procedure: RIGHT HEART CATH;  Surgeon: Keith Keith PARAS, MD;  Location: [facility] INVASIVE CV LAB;  Service: Cardiovascular;  Laterality: N/A;   RIGHT HEART  CATH N/A 04/23/2024   Procedure: RIGHT HEART CATH;  Surgeon: Keith Keith RAMAN, MD;  Location: [facility] INVASIVE CV LAB;  Service: Cardiovascular;  Laterality: N/A;   TOOTH EXTRACTION  07/06/2020     reports that he quit smoking about 3 months ago. His smoking use included cigarettes and cigars. He  has never used smokeless tobacco. He reports that he does not currently use alcohol. He reports that he does not currently use drugs after having used the following drugs: Marijuana.  Allergies[1]  Family History  Problem Relation Age of Onset   Alcohol abuse Father    Hypertension Father    Heart disease Father    Gout Father    Breast cancer Mother    Hypertension Mother    Gout Mother    Healthy Sister    Healthy Daughter    Colon cancer Neg Hx    Esophageal cancer Neg Hx    Rectal cancer Neg Hx    Stomach cancer Neg Hx    Family history reviewed and not pertinent .  Prior to Admission medications  Medication Sig Start Date End Date Taking? Authorizing Provider  blood glucose meter kit and supplies Dispense based on patient and insurance preference. Use up to four times daily as directed. (FOR ICD-9 250.00, 250.01). 07/09/16   Keith Toribio GAILS, MD  buPROPion  (WELLBUTRIN  XL) 300 MG 24 hr tablet TAKE 1 TABLET BY MOUTH ONCE  DAILY 04/13/24   Keith Leblanc, Darleene, NP  colchicine  0.6 MG tablet TAKE 1 TABLET BY MOUTH DAILY  WHEN HAVING FLARES TAKE 1 TABLET BY MOUTH TWICE DAILY 09/05/23   Keith Leblanc, Darleene, NP  ELIQUIS  5 MG TABS tablet TAKE 1 TABLET BY MOUTH TWICE  DAILY 04/27/24   Leblanc, Keith D, NP  famotidine  (PEPCID ) 20 MG tablet Take 1 tablet (20 mg total) by mouth daily. 05/18/24 05/18/25  Keith Keith RAMAN, MD  glucose blood (ONETOUCH ULTRA TEST) test strip Use to check blood sugar 2-3 times a day. 03/01/24   Keith File, MD  hydrOXYzine  (VISTARIL ) 50 MG capsule Take 1 capsule (50 mg total) by mouth every 8 (eight) hours as needed. 03/23/24   Keith Leblanc, Darleene, NP  Insulin  Pen Needle (B-D UF III MINI PEN NEEDLES) 31G X 5 MM MISC USE 4 TIMES DAILY AFTER MEALS AND AT BEDTIME 02/24/20   Keith File, MD  Lancets [facility] ULTRASOFT) lancets Test blood sugars as directed. Dx E11.9 08/06/16   Keith Darleene, NP  magnesium  oxide (MAG-OX) 400 (240 Mg) MG tablet TAKE 1 TABLET(400 MG) BY MOUTH  DAILY Patient not taking: Reported on 05/10/2024 02/12/24   Leblanc, Keith D, NP  midodrine  (PROAMATINE ) 5 MG tablet Take 3 tablets (15 mg total) by mouth 3 (three) times daily with meals. 05/03/24   Tobie Yetta HERO, MD  Multiple Vitamins-Minerals (ONE-A-DAY MENS 50+) TABS Take 1 tablet by mouth daily with breakfast.    [provider]  oxybutynin  (DITROPAN ) 5 MG tablet Take 1 tablet (5 mg total) by mouth every 8 (eight) hours as needed for bladder spasms. 05/18/24   Keith Keith RAMAN, MD  pantoprazole  (PROTONIX ) 40 MG tablet Take 1 tablet (40 mg total) by mouth daily. 05/18/24   Keith Keith RAMAN, MD  potassium chloride  SA (KLOR-CON  M) 20 MEQ tablet Take 2 tablets (40 mEq total) by mouth 2 (two) times daily. 05/18/24   Keith Keith RAMAN, MD  rosuvastatin  (CRESTOR ) 20 MG tablet Take 1 tablet (20 mg total) by mouth daily. 04/08/24   Hayes Beckey CROME, NP  thiamine  (VITAMIN B-1) 100 MG tablet Take 1 tablet (100 mg total) by mouth daily. 11/11/22   Toberman, Stevi W, NP  torsemide  (DEMADEX ) 20 MG tablet Take 3 tablets (60 mg total) by mouth 2 (two) times daily. 05/03/24   Tobie Yetta HERO, MD  triamcinolone  cream (KENALOG ) 0.5 % Apply 1 Application topically as needed.    [provider]    Physical Exam: Vitals:   06/14/24 1149 06/14/24 1400 06/14/24 1500 06/14/24 1600  BP:  (!) 127/95 105/78   Pulse:  71 70   Resp:  17 16   Temp:    97.8 F (36.6 C)  TempSrc:    Oral  SpO2:  100% 100%   Weight: 103.8 kg     Height: 6' 5 (1.956 m)       Constitutional: Appears calm and comfortable, not in any acute distress. Vitals:   06/14/24 1149 06/14/24 1400 06/14/24 1500 06/14/24 1600  BP:  (!) 127/95 105/78   Pulse:  71 70   Resp:  17 16   Temp:    97.8 F (36.6 C)  TempSrc:    Oral  SpO2:  100% 100%   Weight: 103.8 kg     Height: 6' 5 (1.956 m)      Eyes: PERRL, lids and conjunctivae normal ENMT: Mucous membranes are moist. Posterior pharynx clear of any exudate or lesions.   Neck: normal, supple, no masses, no thyromegaly Respiratory: CTA bilaterally, no wheezing, no crackles. Normal respiratory effort. No accessory muscle use.  Cardiovascular: S1+ S2 heard,  Regular rate and rhythm, no murmurs / rubs / gallops. No extremity edema. 2+ pedal pulses. Abdomen: Soft,  no tenderness, no masses palpated. No hepatosplenomegaly. Bowel sounds positive.  Musculoskeletal: no clubbing / cyanosis. No joint deformity upper and lower extremities. Normal muscle tone.  Skin: no rashes, lesions, ulcers. No induration Neurologic: CN 2-12 grossly intact. Sensation intact, DTR normal. Strength 5/5 in all 4.  Psychiatric: Normal judgment and insight. Alert and oriented x 3. Normal mood.   Labs on Admission: I have personally reviewed following labs and imaging studies  CBC: Recent Labs  Lab 06/14/24 1241  WBC 5.4  NEUTROABS 2.9  HGB 10.1*  HCT 33.3*  MCV 93.0  PLT 433*   Basic Metabolic Panel: Recent Labs  Lab 06/14/24 1241  NA 139  K 4.4  CL 100  CO2 28  GLUCOSE 78  BUN 50*  CREATININE 3.06*  CALCIUM  9.9   GFR: Estimated Creatinine Clearance: 34 mL/min (A) (by C-G formula based on SCr of 3.06 mg/dL (H)). Liver Function Tests: Recent Labs  Lab 06/14/24 1241  AST 33  ALT 40  ALKPHOS 152*  BILITOT 0.9  PROT 7.4  ALBUMIN  4.1   No results for input(s): LIPASE, AMYLASE in the last 168 hours. No results for input(s): AMMONIA in the last 168 hours. Coagulation Profile: No results for input(s): INR, PROTIME in the last 168 hours. Cardiac Enzymes: No results for input(s): CKTOTAL, CKMB, CKMBINDEX, TROPONINI in the last 168 hours. BNP (last 3 results) Recent Labs    03/12/24 0942 06/14/24 1241  PROBNP 5,364.0* 8,130.0*   HbA1C: No results for input(s): HGBA1C in the last 72 hours. CBG: No results for input(s): GLUCAP in the last 168 hours. Lipid Profile: No results for input(s): CHOL, HDL, LDLCALC, TRIG, CHOLHDL,  LDLDIRECT in the last 72 hours. Thyroid  Function Tests: No results for input(s): TSH, T4TOTAL, FREET4, T3FREE, THYROIDAB in the last 72 hours. Anemia Panel: No results for  input(s): VITAMINB12, FOLATE, FERRITIN, TIBC, IRON , RETICCTPCT in the last 72 hours. Urine analysis:    Component Value Date/Time   COLORURINE YELLOW 05/03/2024 1211   APPEARANCEUR CLEAR 05/03/2024 1211   LABSPEC 1.008 05/03/2024 1211   PHURINE 5.0 05/03/2024 1211   GLUCOSEU NEGATIVE 05/03/2024 1211   HGBUR SMALL (A) 05/03/2024 1211   BILIRUBINUR NEGATIVE 05/03/2024 1211   KETONESUR NEGATIVE 05/03/2024 1211   PROTEINUR 30 (A) 05/03/2024 1211   NITRITE NEGATIVE 05/03/2024 1211   LEUKOCYTESUR NEGATIVE 05/03/2024 1211    Radiological Exams on Admission: DG Chest 1 View Result Date: 06/14/2024 CLINICAL DATA:  Post right thoracentesis EXAM: CHEST  1 VIEW COMPARISON:  06/14/2024 FINDINGS: Left lung is grossly clear. Decreased right pleural effusion post thoracentesis without visible right pneumothorax. Small residual noted. Mild airspace disease right base but improved. Stable cardiomediastinal silhouette IMPRESSION: Decreased right pleural effusion post thoracentesis without visible pneumothorax. Small residual right effusion and mild right base airspace disease. Electronically Signed   By: Luke Bun M.D.   On: 06/14/2024 16:15   DG Chest 2 View Result Date: 06/14/2024 CLINICAL DATA:  Worsening shortness of breath for 1 week. EXAM: DG CHEST 2V COMPARISON:  04/23/2024 FINDINGS: The heart size and mediastinal contours are within normal limits. New moderate right pleural effusion. Right lower lung atelectasis. Left lung is clear. IMPRESSION: New moderate right pleural effusion and compressive atelectasis. Electronically Signed   By: Norleen DELENA Kil M.D.   On: 06/14/2024 12:54    EKG: Independently reviewed.  Atrial fibrillation.  Assessment/Plan Principal Problem:   Acute on chronic systolic CHF  (congestive heart failure), NYHA class 3 (HCC) Active Problems:   Essential hypertension   Gout   Obesity (BMI 30-39.9)   Tobacco abuse   Type 2 diabetes mellitus with hyperglycemia (HCC)   Paroxysmal A-fib (HCC)  Acute on chronic systolic and diastolic CHF: Right-sided pleural effusion: Patient has end-stage chronic diastolic and systolic CHF. He presented with worsening shortness of breath with elevated proBNP and chest x-ray showed right pleural effusion. Patient was taking torsemide  60 mg every 12 hours at home. Patient has received Lasix  100 mg IV once.  Monitor and reassess need for further diuretics. Status post right thoracocentesis -1.3 L of clear fluid drained. Patient reports significant improvement with diminished crackles on the bases. Monitor daily weight, intake output charting. Recent echo 10/25 shows LVEF 40 to 45%.  Patient is on the [facility] referral for transplant workup. Continue midodrine  15 mg 3 times daily for hypotension. Remainder of the GDMT limited by renal failure and hypotension. Patient was seen by advanced heart failure team.   Paroxysmal A-fib: Heart rate controlled. Continue telemetry. Continue Eliquis  5 mg twice daily.  CKD stage iiib- IV: Serum creatinine at baseline. Patient is scheduled at [facility] in January for transplant referral. Follow-up with diuresis.  Essential hypertension: BP has been low. Not on any Anti-HTN  Continue midodrine ,  avoid hypotension.  Depression: Continue bupropion .  DM II: Carb modified diet, obtain hemoglobin A1c. Patient is not on any medications.  Hyperlipidemia: Continue Crestor  20 mg daily.  GERD: Continue pantoprazole  40 mg daily.   DVT prophylaxis: Eliquis  Code Status: Full code Family Communication: Girl friend at bed side. Disposition Plan:   Status is: Inpatient Remains inpatient appropriate because:   Admitted for acute on chronic systolic CHF with pleural effusion underwent right  thoracocentesis 1.3 L of fluid drained.  Patient was seen by advanced CHF team.    Consults called:  Cardiology Admission status: Inpatient  Darcel Dawley MD [facility]   If 7PM-7AM, please contact night-coverage   06/14/2024, 6:27 PM        [1] No Known Allergies  "

## 2024-06-14 NOTE — ED Provider Notes (Signed)
 Blood pressure (!) 127/95, pulse 71, temperature 97.9 F (36.6 C), resp. rate 17, height 6' 5 (1.956 m), weight 103.8 kg, SpO2 100%.  Assuming care from Dr. Levander.  In short, Keith Leblanc is a 58 y.o. male with a chief complaint of Shortness of Breath .  Refer to the original H&P for additional details.  The current plan of care is to f/u after IR drainage of pleural effusion.  04:20 PM  Cardiology recommending admit per heart failure team.    Darra Fonda MATSU, MD 06/15/24 [phone]

## 2024-06-14 NOTE — ED Provider Triage Note (Signed)
 Emergency Medicine Provider Triage Evaluation Note  Keith Leblanc , a 58 y.o. male  was evaluated in triage.  Pt complains of shortness of breath for the past 2 weeks, underlying history of heart failure.  Is for a heart transplant.  Hospitalized approximately a month ago for acute exacerbation of heart failure.  His weight is up approximately 10 to 15 pounds, unable to sleep as he feels like he is not getting enough air.  Wearing his shoes on tie due to swelling to bilateral lower extremities.  Review of Systems  Positive: Shortness of breath, bilateral leg edema Negative: Fever, chest pain  Physical Exam  BP 121/88   Pulse 80   Temp 97.9 F (36.6 C)   Resp 18   Ht 6' 5 (1.956 m)   Wt 103.8 kg   SpO2 100%   BMI 27.14 kg/m  Gen:   Awake, no distress   Resp:  Normal effort  MSK:   Moves extremities without difficulty  Other:  1+ pitting edema, lungs are clear  Medical Decision Making  Medically screening exam initiated at 12:21 PM.  Appropriate orders placed.  Keith Leblanc was informed that the remainder of the evaluation will be completed by another provider, this initial triage assessment does not replace that evaluation, and the importance of remaining in the ED until their evaluation is complete.     Keith Raben, PA-C 06/14/24 1227

## 2024-06-14 NOTE — ED Provider Notes (Signed)
 " [place] EMERGENCY DEPARTMENT AT [facility] Provider Note   CSN: [id number] Arrival date & time: 06/14/24  1110     Patient presents with: Shortness of Breath   Keith Leblanc is a 58 y.o. male.   HPI   58 year old male history of paroxysmal atrial fibrillation, CHF, CKD, history of stroke, presents today complaining of increasing shortness of breath.  He reports he was hospitalized in September and October.  Patient reports that he quit smoking and drinking in September.  He has been receiving iron  infusions.  He reports taking all of his medications as prescribed.  He has noted increasing shortness of breath.  He reports that he is following up at [facility] in early January for evaluation for transplant.  He denies fever, chills, significant change in weight, increase in peripheral edema.  Prior to Admission medications  Medication Sig Start Date End Date Taking? Authorizing Provider  buPROPion  (WELLBUTRIN  XL) 300 MG 24 hr tablet TAKE 1 TABLET BY MOUTH ONCE  DAILY 04/13/24  Yes Nafziger, Darleene, NP  colchicine  0.6 MG tablet TAKE 1 TABLET BY MOUTH DAILY  WHEN HAVING FLARES TAKE 1 TABLET BY MOUTH TWICE DAILY 09/05/23  Yes Nafziger, Darleene, NP  ELIQUIS  5 MG TABS tablet TAKE 1 TABLET BY MOUTH TWICE  DAILY 04/27/24  Yes West, Katlyn D, NP  hydrOXYzine  (VISTARIL ) 50 MG capsule Take 1 capsule (50 mg total) by mouth every 8 (eight) hours as needed. 03/23/24  Yes Nafziger, Darleene, NP  midodrine  (PROAMATINE ) 5 MG tablet Take 3 tablets (15 mg total) by mouth 3 (three) times daily with meals. 05/03/24  Yes Tobie Yetta HERO, MD  Multiple Vitamins-Minerals (ONE-A-DAY MENS 50+) TABS Take 1 tablet by mouth daily with breakfast.   Yes [provider]  oxybutynin  (DITROPAN ) 5 MG tablet Take 1 tablet (5 mg total) by mouth every 8 (eight) hours as needed for bladder spasms. 05/18/24  Yes Rolan Ezra RAMAN, MD  pantoprazole  (PROTONIX ) 40 MG tablet Take 1 tablet (40 mg total) by mouth daily. 05/18/24   Yes Rolan Ezra RAMAN, MD  potassium chloride  SA (KLOR-CON  M) 20 MEQ tablet Take 2 tablets (40 mEq total) by mouth 2 (two) times daily. 05/18/24  Yes Rolan Ezra RAMAN, MD  torsemide  (DEMADEX ) 20 MG tablet Take 3 tablets (60 mg total) by mouth 2 (two) times daily. 05/03/24  Yes Tobie Yetta HERO, MD  triamcinolone  cream (KENALOG ) 0.5 % Apply 1 Application topically as needed (Irritation).   Yes [provider]    Allergies: Patient has no known allergies.    Review of Systems  Updated Vital Signs BP (!) 83/60   Pulse 85   Temp 98.4 F (36.9 C)   Resp 10   Ht 1.956 m (6' 5)   Wt 96 kg   SpO2 99%   BMI 25.10 kg/m   Physical Exam Vitals reviewed.  Constitutional:      General: He is not in acute distress.    Appearance: He is well-developed.  HENT:     Head: Normocephalic.     Mouth/Throat:     Mouth: Mucous membranes are moist.  Eyes:     Pupils: Pupils are equal, round, and reactive to light.  Cardiovascular:     Rate and Rhythm: Normal rate.  Pulmonary:     Effort: Pulmonary effort is normal.     Breath sounds: Examination of the left-middle field reveals decreased breath sounds. Examination of the left-lower field reveals decreased breath sounds. Decreased breath sounds present.  No wheezing or rhonchi.  Chest:     Chest wall: No deformity.  Abdominal:     General: Bowel sounds are normal.     Palpations: Abdomen is soft.  Musculoskeletal:     Cervical back: Normal range of motion.     Right lower leg: Edema present.     Left lower leg: Edema present.  Skin:    General: Skin is warm and dry.     Capillary Refill: Capillary refill takes less than 2 seconds.  Neurological:     General: No focal deficit present.     Mental Status: He is alert.  Psychiatric:        Mood and Affect: Mood normal.     (all labs ordered are listed, but only abnormal results are displayed) Labs Reviewed  CBC WITH DIFFERENTIAL/PLATELET - Abnormal; Notable for the following  components:      Result Value   RBC 3.58 (*)    Hemoglobin 10.1 (*)    HCT 33.3 (*)    RDW 19.3 (*)    Platelets 433 (*)    All other components within normal limits  COMPREHENSIVE METABOLIC PANEL WITH GFR - Abnormal; Notable for the following components:   BUN 50 (*)    Creatinine, Ser 3.06 (*)    Alkaline Phosphatase 152 (*)    GFR, Estimated 23 (*)    All other components within normal limits  PRO BRAIN NATRIURETIC PEPTIDE - Abnormal; Notable for the following components:   Pro Brain Natriuretic Peptide 8,130.0 (*)    All other components within normal limits  COMPREHENSIVE METABOLIC PANEL WITH GFR - Abnormal; Notable for the following components:   BUN 53 (*)    Creatinine, Ser 3.00 (*)    Alkaline Phosphatase 135 (*)    GFR, Estimated 24 (*)    All other components within normal limits  CBC - Abnormal; Notable for the following components:   RBC 3.35 (*)    Hemoglobin 9.5 (*)    HCT 30.4 (*)    RDW 19.0 (*)    All other components within normal limits  CBC - Abnormal; Notable for the following components:   RBC 3.42 (*)    Hemoglobin 9.5 (*)    HCT 30.3 (*)    RDW 18.9 (*)    All other components within normal limits  BASIC METABOLIC PANEL WITH GFR - Abnormal; Notable for the following components:   Glucose, Bld 133 (*)    BUN 50 (*)    Creatinine, Ser 3.17 (*)    GFR, Estimated 22 (*)    All other components within normal limits  URIC ACID - Abnormal; Notable for the following components:   Uric Acid, Serum 17.6 (*)    All other components within normal limits  BASIC METABOLIC PANEL WITH GFR - Abnormal; Notable for the following components:   Chloride 97 (*)    Glucose, Bld 139 (*)    BUN 55 (*)    Creatinine, Ser 3.66 (*)    GFR, Estimated 19 (*)    Anion gap 16 (*)    All other components within normal limits  BASIC METABOLIC PANEL WITH GFR - Abnormal; Notable for the following components:   Chloride 97 (*)    CO2 19 (*)    Glucose, Bld 112 (*)    BUN  56 (*)    Creatinine, Ser 4.07 (*)    GFR, Estimated 16 (*)    Anion gap 20 (*)    All  other components within normal limits  GLUCOSE, CAPILLARY - Abnormal; Notable for the following components:   Glucose-Capillary 145 (*)    All other components within normal limits  CBC - Abnormal; Notable for the following components:   RBC 3.23 (*)    Hemoglobin 9.1 (*)    HCT 29.4 (*)    RDW 18.0 (*)    All other components within normal limits  RENAL FUNCTION PANEL - Abnormal; Notable for the following components:   Glucose, Bld 133 (*)    BUN 38 (*)    Creatinine, Ser 2.60 (*)    GFR, Estimated 28 (*)    All other components within normal limits  GLUCOSE, CAPILLARY - Abnormal; Notable for the following components:   Glucose-Capillary 150 (*)    All other components within normal limits  RENAL FUNCTION PANEL - Abnormal; Notable for the following components:   Glucose, Bld 132 (*)    BUN 29 (*)    Creatinine, Ser 2.11 (*)    Phosphorus 2.2 (*)    GFR, Estimated 36 (*)    All other components within normal limits  GLUCOSE, CAPILLARY - Abnormal; Notable for the following components:   Glucose-Capillary 118 (*)    All other components within normal limits  GLUCOSE, CAPILLARY - Abnormal; Notable for the following components:   Glucose-Capillary 141 (*)    All other components within normal limits  COOXEMETRY PANEL - Abnormal; Notable for the following components:   Total hemoglobin 9.1 (*)    Carboxyhemoglobin 1.6 (*)    All other components within normal limits  GLUCOSE, CAPILLARY - Abnormal; Notable for the following components:   Glucose-Capillary 130 (*)    All other components within normal limits  BASIC METABOLIC PANEL WITH GFR - Abnormal; Notable for the following components:   Glucose, Bld 114 (*)    BUN 22 (*)    Creatinine, Ser 1.85 (*)    GFR, Estimated 42 (*)    All other components within normal limits  CBC - Abnormal; Notable for the following components:   RBC 3.29  (*)    Hemoglobin 9.2 (*)    HCT 30.0 (*)    RDW 17.6 (*)    All other components within normal limits  RENAL FUNCTION PANEL - Abnormal; Notable for the following components:   Glucose, Bld 114 (*)    BUN 22 (*)    Creatinine, Ser 1.85 (*)    Phosphorus 2.3 (*)    GFR, Estimated 42 (*)    All other components within normal limits  MAGNESIUM  - Abnormal; Notable for the following components:   Magnesium  2.5 (*)    All other components within normal limits  COOXEMETRY PANEL - Abnormal; Notable for the following components:   Total hemoglobin 9.5 (*)    All other components within normal limits  GLUCOSE, CAPILLARY - Abnormal; Notable for the following components:   Glucose-Capillary 132 (*)    All other components within normal limits  RENAL FUNCTION PANEL - Abnormal; Notable for the following components:   Glucose, Bld 102 (*)    Creatinine, Ser 1.51 (*)    Calcium  8.4 (*)    Phosphorus 2.2 (*)    GFR, Estimated 54 (*)    All other components within normal limits  GLUCOSE, CAPILLARY - Abnormal; Notable for the following components:   Glucose-Capillary 110 (*)    All other components within normal limits  GLUCOSE, CAPILLARY - Abnormal; Notable for the following components:   Glucose-Capillary 128 (*)  All other components within normal limits  GLUCOSE, CAPILLARY - Abnormal; Notable for the following components:   Glucose-Capillary 105 (*)    All other components within normal limits  BASIC METABOLIC PANEL WITH GFR - Abnormal; Notable for the following components:   Glucose, Bld 113 (*)    Creatinine, Ser 1.69 (*)    GFR, Estimated 47 (*)    All other components within normal limits  CBC - Abnormal; Notable for the following components:   RBC 3.49 (*)    Hemoglobin 9.7 (*)    HCT 31.7 (*)    RDW 17.6 (*)    All other components within normal limits  RENAL FUNCTION PANEL - Abnormal; Notable for the following components:   Glucose, Bld 113 (*)    Creatinine, Ser 1.74 (*)     Phosphorus 2.4 (*)    GFR, Estimated 45 (*)    All other components within normal limits  MAGNESIUM  - Abnormal; Notable for the following components:   Magnesium  2.5 (*)    All other components within normal limits  GLUCOSE, CAPILLARY - Abnormal; Notable for the following components:   Glucose-Capillary 139 (*)    All other components within normal limits  COOXEMETRY PANEL - Abnormal; Notable for the following components:   Total hemoglobin 10.0 (*)    All other components within normal limits  GLUCOSE, CAPILLARY - Abnormal; Notable for the following components:   Glucose-Capillary 124 (*)    All other components within normal limits  GLUCOSE, CAPILLARY - Abnormal; Notable for the following components:   Glucose-Capillary 121 (*)    All other components within normal limits  POCT I-STAT EG7 - Abnormal; Notable for the following components:   pCO2, Ven 39.1 (*)    pO2, Ven 30 (*)    HCT 32.0 (*)    Hemoglobin 10.9 (*)    All other components within normal limits  POCT I-STAT EG7 - Abnormal; Notable for the following components:   pCO2, Ven 39.7 (*)    pO2, Ven 30 (*)    HCT 32.0 (*)    Hemoglobin 10.9 (*)    All other components within normal limits  RESP PANEL BY RT-PCR (RSV, FLU A&B, COVID)  RVPGX2  MRSA NEXT GEN BY PCR, NASAL  MRSA NEXT GEN BY PCR, NASAL  MAGNESIUM   PHOSPHORUS  MAGNESIUM   HEMOGLOBIN A1C  MAGNESIUM   RENAL FUNCTION PANEL  COOXEMETRY PANEL    EKG: EKG Interpretation Date/Time:  Monday June 14 2024 12:19:52 EST Ventricular Rate:  78 PR Interval:    QRS Duration:  84 QT Interval:  432 QTC Calculation: 492 R Axis:   270  Text Interpretation: Atrial fibrillation Right superior axis deviation Inferior infarct , age undetermined Anterior infarct , age undetermined T wave abnormality, consider lateral ischemia Abnormal ECG When compared with ECG of 22-Apr-2024 14:01, PREVIOUS ECG IS PRESENT Confirmed by Levander Houston [id number]) on 06/14/2024 1:12:32  PM  Radiology: ECHOCARDIOGRAM LIMITED Result Date: 06/20/2024    ECHOCARDIOGRAM LIMITED REPORT   Patient Name:   SCOUT GUMBS Date of Exam: 06/20/2024 Medical Rec #:  [id number]       Height:       77.0 in Accession #:    [id number]      Weight:       217.6 lb Date of Birth:  1965-12-27       BSA:          2.318 m Patient Age:    21 years  BP:           98/63 mmHg Patient Gender: M               HR:           77 bpm. Exam Location:  Inpatient Procedure: Limited Echo, Cardiac Doppler and Color Doppler (Both Spectral and            Color Flow Doppler were utilized during procedure). Indications:    CHF-acute systolic 150.21  History:        Patient has prior history of Echocardiogram examinations, most                 recent 04/21/2024. CHF, Arrythmias:Atrial Fibrillation; Risk                 Factors:Hypertension, Diabetes and Former Smoker.  Sonographer:    Merlynn Argyle Referring Phys: [id number] Talib R BENSIMHON IMPRESSIONS  1. Left ventricular ejection fraction, by estimation, is 30 to 35%. The left ventricle has moderately decreased function. The left ventricle demonstrates global hypokinesis. There is moderate concentric left ventricular hypertrophy.  2. Right ventricular systolic function is moderately reduced. The right ventricular size is mildly enlarged.  3. The aortic valve is tricuspid.  4. The inferior vena cava is dilated in size with <50% respiratory variability, suggesting right atrial pressure of 15 mmHg. FINDINGS  Left Ventricle: Left ventricular ejection fraction, by estimation, is 30 to 35%. The left ventricle has moderately decreased function. The left ventricle demonstrates global hypokinesis. There is moderate concentric left ventricular hypertrophy. Right Ventricle: The right ventricular size is mildly enlarged. Right ventricular systolic function is moderately reduced. Tricuspid Valve: The tricuspid valve is normal in structure. Tricuspid valve regurgitation is mild. Aortic Valve: The  aortic valve is tricuspid. Venous: The inferior vena cava is dilated in size with less than 50% respiratory variability, suggesting right atrial pressure of 15 mmHg. Additional Comments: A venous catheter is visualized in the right ventricle.  LEFT VENTRICLE PLAX 2D LVIDd:         5.00 cm LVIDs:         3.70 cm LV PW:         1.40 cm LV IVS:        1.30 cm  LV Volumes (MOD) LV vol d, MOD A2C: 103.4 ml LV vol d, MOD A4C: 117.0 ml LV vol s, MOD A2C: 72.6 ml LV vol s, MOD A4C: 80.2 ml LV SV MOD A2C:     30.8 ml LV SV MOD A4C:     117.0 ml LV SV MOD BP:      33.6 ml RIGHT VENTRICLE            IVC RV S prime:     7.61 cm/s  IVC diam: 2.40 cm TAPSE (M-mode): 1.1 cm Oneil Parchment MD Electronically signed by Oneil Parchment MD Signature Date/Time: 06/20/2024/2:20:11 PM    Final      .Critical Care  Performed by: Levander Houston, MD Authorized by: Levander Houston, MD   Critical care provider statement:    Critical care time (minutes):  30   Critical care time was exclusive of:  Separately billable procedures and treating other patients and teaching time   Critical care was necessary to treat or prevent imminent or life-threatening deterioration of the following conditions:  Cardiac failure   Critical care was time spent personally by me on the following activities:  Development of treatment plan with patient or surrogate, discussions with consultants, evaluation  of patient's response to treatment, examination of patient, ordering and review of laboratory studies, ordering and review of radiographic studies, ordering and performing treatments and interventions, pulse oximetry, re-evaluation of patient's condition and review of old charts    Medications Ordered in the ED  buPROPion  (WELLBUTRIN  XL) 24 hr tablet 300 mg (300 mg Oral Given 06/21/24 0942)  pantoprazole  (PROTONIX ) EC tablet 40 mg (40 mg Oral Given 06/21/24 0944)  oxybutynin  (DITROPAN ) tablet 5 mg ( Oral MAR Unhold 06/18/24 1540)  apixaban  (ELIQUIS ) tablet 5  mg (5 mg Oral Given 06/21/24 0942)  0.9 %  sodium chloride  infusion (has no administration in time range)  acetaminophen  (TYLENOL ) tablet 650 mg (650 mg Oral Given 06/21/24 0944)    Or  acetaminophen  (TYLENOL ) suppository 650 mg ( Rectal See Alternative 06/21/24 0944)  docusate sodium  (COLACE) capsule 100 mg (100 mg Oral Patient Refused/Not Given 06/21/24 0945)  senna (SENOKOT) tablet 8.6 mg (8.6 mg Oral Patient Refused/Not Given 06/21/24 0945)  ondansetron  (ZOFRAN ) tablet 4 mg ( Oral MAR Unhold 06/18/24 1540)    Or  ondansetron  (ZOFRAN ) injection 4 mg ( Intravenous MAR Unhold 06/18/24 1540)  allopurinol  (ZYLOPRIM ) tablet 50 mg (50 mg Oral Given 06/21/24 0943)  rosuvastatin  (CRESTOR ) tablet 10 mg (10 mg Oral Given 06/21/24 0944)  sodium chloride  flush (NS) 0.9 % injection 10-40 mL (20 mLs Intracatheter Given 06/21/24 0945)  sodium chloride  flush (NS) 0.9 % injection 10-40 mL (20 mLs Intracatheter Given 06/20/24 1146)  Chlorhexidine  Gluconate Cloth 2 % PADS 6 each (6 each Topical Given 06/21/24 0945)  0.9 %  sodium chloride  infusion ( Intravenous Stopped 06/19/24 1720)  heparin  injection 1,000-6,000 Units (has no administration in time range)  sodium chloride  0.9 % primer fluid for CRRT (has no administration in time range)  prismasol  BGK 4/2.5 infusion ( CRRT New Bag/Given 06/21/24 0935)  prismasol  BGK 4/2.5 infusion ( CRRT New Bag/Given 06/21/24 0935)  prismasol  BGK 4/2.5 infusion ( CRRT New Bag/Given 06/21/24 1126)  insulin  aspart (novoLOG ) injection 1-3 Units ( Subcutaneous Not Given 06/21/24 1127)  multivitamin with minerals tablet 1 tablet (1 tablet Oral Given 06/21/24 0942)  famotidine  (PEPCID ) tablet 10 mg (10 mg Oral Given 06/21/24 0942)  DOBUTamine  (DOBUTREX ) infusion 4000 mcg/mL (2.5 mcg/kg/min  96 kg Intravenous Infusion Verify 06/21/24 1100)  potassium PHOSPHATE  15 mmol in dextrose  5 % 250 mL infusion ( Intravenous Infusion Verify 06/21/24 1100)  Oral care mouth rinse (has no  administration in time range)  norepinephrine  (LEVOPHED ) 4mg  in (0.016 mg/mL) premix infusion (has no administration in time range)  traZODone  (DESYREL ) tablet 100 mg (has no administration in time range)  QUEtiapine  (SEROQUEL ) tablet 50 mg (has no administration in time range)  lidocaine -EPINEPHrine  (XYLOCAINE  W/EPI) 1 %-1:100000 (with pres) injection 20 mL (10 mLs Infiltration Given 06/14/24 1521)  furosemide  (LASIX ) 100 mg in dextrose  5 % 50 mL IVPB (0 mg Intravenous Stopped 06/14/24 2124)  potassium chloride  SA (KLOR-CON  M) CR tablet 40 mEq (40 mEq Oral Given 06/15/24 1246)  oxyCODONE  (Oxy IR/ROXICODONE ) immediate release tablet 5 mg (5 mg Oral Given 06/16/24 0437)  colchicine  tablet 1.2 mg (1.2 mg Oral Given 06/16/24 0624)  acetaZOLAMIDE  (DIAMOX ) tablet 500 mg (500 mg Oral Given 06/16/24 0921)  oxyCODONE  (Oxy IR/ROXICODONE ) immediate release tablet 5 mg (5 mg Oral Given 06/18/24 2103)  predniSONE  (DELTASONE ) tablet 20 mg (20 mg Oral Given 06/18/24 0653)  potassium chloride  SA (KLOR-CON  M) CR tablet 40 mEq (40 mEq Oral Given 06/16/24 0817)  potassium chloride  SA (KLOR-CON  M)  CR tablet 40 mEq (40 mEq Oral Given 06/17/24 1542)  aspirin  chewable tablet 81 mg (81 mg Oral Given 06/18/24 0653)  potassium PHOSPHATE  15 mmol in dextrose  5 % 250 mL infusion (0 mmol Intravenous Stopped 06/20/24 1435)    Clinical Course as of 06/21/24 1229  Mon Jun 14, 2024  1339 Chest x-Camdin Hegner reviewed interpreted significant for new moderate right pleural effusion with compressive atelectasis [DR]  1340 BNP elevated at 8130 Creatinine 3.0 BUN 15 Hemoglobin is 10 today [DR]    Clinical Course User Index [DR] Levander Houston, MD                                 Medical Decision Making Risk Decision regarding hospitalization.    58 year old male CHF presents today with worsening dyspnea.  On exam here he has decreased breath sounds on the right.  Chest x-Rick Warnick significant for right moderate to large  right-sided effusion.  Patient is on Eliquis . Patient evaluated with chest x-Calixto Pavel with right sided effusion, EKG with A-fib rate controlled. Hemoglobin is 10 BUN is 50 with creatinine 3 Discussed care with cardiology and they will see and discuss. Discussed care with interventional radiology and they wants will see in attempt plan for outpatient thoracentesis as patient does not wish to stay in the hospital. Messaged with Jamie Covington, NP and they will proceed with thoracentesis Discussed with patient. He is hemodynamically stable with oxygen saturations of 100%. Care signed out to Dr. Darra     Final diagnoses:  Dyspnea, unspecified type  Pleural effusion    ED Discharge Orders     None          Levander Houston, MD 06/21/24 1229  "

## 2024-06-14 NOTE — ED Triage Notes (Signed)
 Patient here today with complaints of shortness of breath that has worsened over the past week. He is a CHF patient. Denies swelling in extremities and denies chest pain.

## 2024-06-14 NOTE — Procedures (Signed)
 PROCEDURE SUMMARY:  Successful US  guided right thoracentesis. Yielded 1.3 L of amber-colored fluid. Pt tolerated procedure well. No immediate complications.  Specimen not sent for labs. CXR ordered; no post-procedure pneumothorax identified.   EBL < 2 mL  Warren JONELLE Dais, NP 06/14/2024 4:15 PM

## 2024-06-14 NOTE — ED Triage Notes (Signed)
 Pt has CHF, was hospitalized last month for exacerbation. Pt states past 2 weeks SOB is worsening and he is unable to sleep due to worse when he lays down. Pt is compliant with all CHF medications. Pt being treated for kidney disease, says he is not on dialysis yet. Pt states he is being assessed in a few weeks for a kidney transplant.

## 2024-06-15 DIAGNOSIS — I5023 Acute on chronic systolic (congestive) heart failure: Secondary | ICD-10-CM | POA: Diagnosis not present

## 2024-06-15 LAB — CBC
HCT: 30.4 % — ABNORMAL LOW (ref 39.0–52.0)
Hemoglobin: 9.5 g/dL — ABNORMAL LOW (ref 13.0–17.0)
MCH: 28.4 pg (ref 26.0–34.0)
MCHC: 31.3 g/dL (ref 30.0–36.0)
MCV: 90.7 fL (ref 80.0–100.0)
Platelets: 378 K/uL (ref 150–400)
RBC: 3.35 MIL/uL — ABNORMAL LOW (ref 4.22–5.81)
RDW: 19 % — ABNORMAL HIGH (ref 11.5–15.5)
WBC: 5.2 K/uL (ref 4.0–10.5)
nRBC: 0 % (ref 0.0–0.2)

## 2024-06-15 LAB — COMPREHENSIVE METABOLIC PANEL WITH GFR
ALT: 23 U/L (ref 0–44)
AST: 27 U/L (ref 15–41)
Albumin: 3.5 g/dL (ref 3.5–5.0)
Alkaline Phosphatase: 135 U/L — ABNORMAL HIGH (ref 38–126)
Anion gap: 13 (ref 5–15)
BUN: 53 mg/dL — ABNORMAL HIGH (ref 6–20)
CO2: 26 mmol/L (ref 22–32)
Calcium: 9.5 mg/dL (ref 8.9–10.3)
Chloride: 101 mmol/L (ref 98–111)
Creatinine, Ser: 3 mg/dL — ABNORMAL HIGH (ref 0.61–1.24)
GFR, Estimated: 24 mL/min — ABNORMAL LOW
Glucose, Bld: 76 mg/dL (ref 70–99)
Potassium: 3.7 mmol/L (ref 3.5–5.1)
Sodium: 140 mmol/L (ref 135–145)
Total Bilirubin: 0.8 mg/dL (ref 0.0–1.2)
Total Protein: 6.5 g/dL (ref 6.5–8.1)

## 2024-06-15 LAB — MRSA NEXT GEN BY PCR, NASAL: MRSA by PCR Next Gen: NOT DETECTED

## 2024-06-15 LAB — PHOSPHORUS: Phosphorus: 4.2 mg/dL (ref 2.5–4.6)

## 2024-06-15 LAB — MAGNESIUM: Magnesium: 2.1 mg/dL (ref 1.7–2.4)

## 2024-06-15 MED ORDER — POTASSIUM CHLORIDE CRYS ER 20 MEQ PO TBCR
40.0000 meq | EXTENDED_RELEASE_TABLET | Freq: Once | ORAL | Status: AC
  Administered 2024-06-15: 40 meq via ORAL
  Filled 2024-06-15: qty 2

## 2024-06-15 MED ORDER — FUROSEMIDE 10 MG/ML IJ SOLN
120.0000 mg | Freq: Two times a day (BID) | INTRAVENOUS | Status: DC
  Administered 2024-06-15 (×2): 120 mg via INTRAVENOUS
  Filled 2024-06-15: qty 2
  Filled 2024-06-15 (×3): qty 12

## 2024-06-15 MED ORDER — FUROSEMIDE 10 MG/ML IJ SOLN
120.0000 mg | Freq: Two times a day (BID) | INTRAVENOUS | Status: DC
  Filled 2024-06-15: qty 10

## 2024-06-15 MED ORDER — FUROSEMIDE 10 MG/ML IJ SOLN
120.0000 mg | Freq: Once | INTRAMUSCULAR | Status: DC
  Filled 2024-06-15: qty 12

## 2024-06-15 MED ORDER — FUROSEMIDE 10 MG/ML IJ SOLN
120.0000 mg | Freq: Once | INTRAVENOUS | Status: DC
  Filled 2024-06-15: qty 12

## 2024-06-15 NOTE — Progress Notes (Signed)
 Orthopedic Tech Progress Note Patient Details:  Keith Leblanc 01/17/1966 [id number]  Ortho Devices Type of Ortho Device: Ace wrap, Unna boot Ortho Device/Splint Location: BLE Ortho Device/Splint Interventions: Ordered, Application, Adjustment   Post Interventions Patient Tolerated: Well Instructions Provided: Care of device  [name] Pac 06/15/2024, 12:37 PM

## 2024-06-15 NOTE — Progress Notes (Signed)
 " PROGRESS NOTE    Keith Leblanc  FMW:[id number] DOB: [birth year] DOA: 06/14/2024 PCP: [name], NP  58/M with end-stage diastolic CHF, former heavy alcoholic, type II DM, CKD 4, history of CVA, paroxysmal A-fib on Eliquis  presented to the ED with progressive shortness of breath, in the ED sodium 139, creatinine 3.0, albumin  4.1, proBNP 8130, hemoglobin 10, chest x-ray with moderate right pleural effusion. - IR consulted underwent thoracentesis, 1.3 L of clear fluid drained in the right   Subjective: -Feels better, breathing continues to improve  Assessment and Plan:  Acute on chronic combined CHF, end-stage Right-sided pleural effusion: - Last echo 10/25 with EF 40-45%, severe LVH, grade 3 DD, moderately reduced RV -sp thoracentesis, 1.3 L of clear fluid drained 12/22 -Heart failure team following, Lasix  120 mg X1 -GDMT limited by CKD 4 and hypotension -Continue midodrine  15 mg 3 times daily -Add Unna boots today -Due to see transplant team at [facility] in early January for heart and kidney transplant eval  Persistent atrial fibrillation Heart rate controlled. Continue Eliquis    CKD stage IV: Serum creatinine better than recent baseline, avoid hypotension Patient is scheduled at [facility] in January for transplant referral. Stable for now   Depression: Continue bupropion .   DM II: Diet controlled, check A1c   Hyperlipidemia: Continue Crestor  20 mg daily.   GERD: Continue pantoprazole  40 mg daily.     DVT prophylaxis: Eliquis  Code Status: Full code Family Communication: Disposition Plan:   Consultants:    Procedures:   Antimicrobials:    Objective: Vitals:   06/14/24 2136 06/14/24 2350 06/15/24 0426 06/15/24 0738  BP: (!) 149/64 102/64 97/73 95/77   Pulse: 88 82 78 95  Resp: 20 20 20 19   Temp: 98 F (36.7 C) 98 F (36.7 C) 97.9 F (36.6 C) 98.1 F (36.7 C)  TempSrc: Oral Oral Oral Oral  SpO2: 99% 94% 97% 97%  Weight:   107.3 kg   Height:         Intake/Output Summary (Last 24 hours) at 06/15/2024 1022 Last data filed at 06/15/2024 0739 Gross per 24 hour  Intake 240 ml  Output 900 ml  Net -660 ml   Filed Weights   06/14/24 1149 06/14/24 2100 06/15/24 0426  Weight: 103.8 kg 107.3 kg 107.3 kg    Examination:  General exam: Appears calm and comfortable AO x 3 HEENT: Positive JVD Respiratory system: Decreased at the bases Cardiovascular system: S1 & S2 heard, RRR.  Abd: nondistended, soft and nontender.Normal bowel sounds heard. Central nervous system: Alert and oriented. No focal neurological deficits. Extremities: Plus edema Skin: No rashes Psychiatry:  Mood & affect appropriate.     Data Reviewed:   CBC: Recent Labs  Lab 06/14/24 1241 06/15/24 0237  WBC 5.4 5.2  NEUTROABS 2.9  --   HGB 10.1* 9.5*  HCT 33.3* 30.4*  MCV 93.0 90.7  PLT 433* 378   Basic Metabolic Panel: Recent Labs  Lab 06/14/24 1241 06/15/24 0237  NA 139 140  K 4.4 3.7  CL 100 101  CO2 28 26  GLUCOSE 78 76  BUN 50* 53*  CREATININE 3.06* 3.00*  CALCIUM  9.9 9.5  MG  --  2.1  PHOS  --  4.2   GFR: Estimated Creatinine Clearance: 37.5 mL/min (A) (by C-G formula based on SCr of 3 mg/dL (H)). Liver Function Tests: Recent Labs  Lab 06/14/24 1241 06/15/24 0237  AST 33 27  ALT 40 23  ALKPHOS 152* 135*  BILITOT 0.9 0.8  PROT  7.4 6.5  ALBUMIN  4.1 3.5   No results for input(s): LIPASE, AMYLASE in the last 168 hours. No results for input(s): AMMONIA in the last 168 hours. Coagulation Profile: No results for input(s): INR, PROTIME in the last 168 hours. Cardiac Enzymes: No results for input(s): CKTOTAL, CKMB, CKMBINDEX, TROPONINI in the last 168 hours. BNP (last 3 results) Recent Labs    03/12/24 0942 06/14/24 1241  PROBNP 5,364.0* 8,130.0*   HbA1C: No results for input(s): HGBA1C in the last 72 hours. CBG: No results for input(s): GLUCAP in the last 168 hours. Lipid Profile: No results for  input(s): CHOL, HDL, LDLCALC, TRIG, CHOLHDL, LDLDIRECT in the last 72 hours. Thyroid  Function Tests: No results for input(s): TSH, T4TOTAL, FREET4, T3FREE, THYROIDAB in the last 72 hours. Anemia Panel: No results for input(s): VITAMINB12, FOLATE, FERRITIN, TIBC, IRON , RETICCTPCT in the last 72 hours. Urine analysis:    Component Value Date/Time   COLORURINE YELLOW 05/03/2024 1211   APPEARANCEUR CLEAR 05/03/2024 1211   LABSPEC 1.008 05/03/2024 1211   PHURINE 5.0 05/03/2024 1211   GLUCOSEU NEGATIVE 05/03/2024 1211   HGBUR SMALL (A) 05/03/2024 1211   BILIRUBINUR NEGATIVE 05/03/2024 1211   KETONESUR NEGATIVE 05/03/2024 1211   PROTEINUR 30 (A) 05/03/2024 1211   NITRITE NEGATIVE 05/03/2024 1211   LEUKOCYTESUR NEGATIVE 05/03/2024 1211   Sepsis Labs: @LABRCNTIP (procalcitonin:4,lacticidven:4)  ) Recent Results (from the past 240 hours)  Resp panel by RT-PCR (RSV, Flu A&B, Covid) Anterior Nasal Swab     Status: None   Collection Time: 06/14/24 12:19 PM   Specimen: Anterior Nasal Swab  Result Value Ref Range Status   SARS Coronavirus 2 by RT PCR NEGATIVE NEGATIVE Final   Influenza A by PCR NEGATIVE NEGATIVE Final   Influenza B by PCR NEGATIVE NEGATIVE Final    Comment: (NOTE) The Xpert Xpress SARS-CoV-2/FLU/RSV plus assay is intended as an aid in the diagnosis of influenza from Nasopharyngeal swab specimens and should not be used as a sole basis for treatment. Nasal washings and aspirates are unacceptable for Xpert Xpress SARS-CoV-2/FLU/RSV testing.  Fact Sheet for Patients: bloggercourse.com  Fact Sheet for Healthcare Providers: seriousbroker.it  This test is not yet approved or cleared by the [place]  FDA and has been authorized for detection and/or diagnosis of SARS-CoV-2 by FDA under an Emergency Use Authorization (EUA). This EUA will remain in effect (meaning this test can be used) for  the duration of the COVID-19 declaration under Section 564(b)(1) of the Act, 21 U.S.C. section 360bbb-3(b)(1), unless the authorization is terminated or revoked.     Resp Syncytial Virus by PCR NEGATIVE NEGATIVE Final    Comment: (NOTE) Fact Sheet for Patients: bloggercourse.com  Fact Sheet for Healthcare Providers: seriousbroker.it  This test is not yet approved or cleared by the [place]  FDA and has been authorized for detection and/or diagnosis of SARS-CoV-2 by FDA under an Emergency Use Authorization (EUA). This EUA will remain in effect (meaning this test can be used) for the duration of the COVID-19 declaration under Section 564(b)(1) of the Act, 21 U.S.C. section 360bbb-3(b)(1), unless the authorization is terminated or revoked.  Performed at [facility], [address] [place], [place] [address]   MRSA Next Gen by PCR, Nasal     Status: None   Collection Time: 06/14/24  9:30 PM   Specimen: Nasal Mucosa; Nasal Swab  Result Value Ref Range Status   MRSA by PCR Next Gen NOT DETECTED NOT DETECTED Final    Comment: (NOTE) The GeneXpert  MRSA Assay (FDA approved for NASAL specimens only), is one component of a comprehensive MRSA colonization surveillance program. It is not intended to diagnose MRSA infection nor to guide or monitor treatment for MRSA infections. Test performance is not FDA approved in patients less than 86 years old. Performed at [facility], [address], [place], [place] [address]      Radiology Studies: IR THORACENTESIS ASP PLEURAL SPACE W/IMG GUIDE Result Date: 06/15/2024 INDICATION: Patient with a history of heart failure presents today with a right pleural effusion. Interventional Radiology asked to perform a therapeutic thoracentesis. EXAM: ULTRASOUND GUIDED THORACENTESIS MEDICATIONS: 1% lidocaine  10 mL COMPLICATIONS: None immediate. PROCEDURE: An ultrasound guided thoracentesis  was thoroughly discussed with the patient and questions answered. The benefits, risks, alternatives and complications were also discussed. The patient understands and wishes to proceed with the procedure. Written consent was obtained. Ultrasound was performed to localize and mark an adequate pocket of fluid in the right chest. The area was then prepped and draped in the normal sterile fashion. 1% Lidocaine  was used for local anesthesia. Under ultrasound guidance a 6 Fr Safe-T-Centesis catheter was introduced. Thoracentesis was performed. The catheter was removed and a dressing applied. FINDINGS: A total of approximately 1.3 L of amber-colored fluid was removed. IMPRESSION: Successful ultrasound guided right thoracentesis yielding 1.3 L of pleural fluid. Procedure performed by: Warren Dais, NP Electronically Signed   By: Cordella Banner   On: 06/15/2024 09:19   DG Chest 1 View Result Date: 06/14/2024 CLINICAL DATA:  Post right thoracentesis EXAM: CHEST  1 VIEW COMPARISON:  06/14/2024 FINDINGS: Left lung is grossly clear. Decreased right pleural effusion post thoracentesis without visible right pneumothorax. Small residual noted. Mild airspace disease right base but improved. Stable cardiomediastinal silhouette IMPRESSION: Decreased right pleural effusion post thoracentesis without visible pneumothorax. Small residual right effusion and mild right base airspace disease. Electronically Signed   By: Luke Bun M.D.   On: 06/14/2024 16:15   DG Chest 2 View Result Date: 06/14/2024 CLINICAL DATA:  Worsening shortness of breath for 1 week. EXAM: DG CHEST 2V COMPARISON:  04/23/2024 FINDINGS: The heart size and mediastinal contours are within normal limits. New moderate right pleural effusion. Right lower lung atelectasis. Left lung is clear. IMPRESSION: New moderate right pleural effusion and compressive atelectasis. Electronically Signed   By: Norleen DELENA Kil M.D.   On: 06/14/2024 12:54     Scheduled  Meds:  apixaban   5 mg Oral BID   buPROPion   300 mg Oral Daily   docusate sodium   100 mg Oral BID   famotidine   20 mg Oral Daily   midodrine   15 mg Oral TID WC   pantoprazole   40 mg Oral Daily   rosuvastatin   20 mg Oral Daily   senna  1 tablet Oral BID   sodium chloride  flush  3 mL Intravenous Q12H   Continuous Infusions:  sodium chloride        LOS: 1 day    Time spent:    Sigurd Pac, MD [facility]   06/15/2024, 10:22 AM    "

## 2024-06-15 NOTE — Progress Notes (Addendum)
 "    Advanced Heart Failure Rounding Note  Cardiologist: None  AHF Cardiologist: Dr. Zenaida Chief Complaint: SOB / End stage combined systolic and diastolic HF Patient Profile   Keith Leblanc is a 58 y.o. male with end stage diastolic heart failure, former heavy drinker, type 2DM, HTN, CKD IIIa, HLD, history of stroke, and PAF on eliquis . Admitted with SOB s/p thoracentesis.   Significant events:  12/22: Admitted with SOB. S/p thoracentesis -1.3L drained. Diuretics started.   Subjective:    Feels better today. Denies SOB. SCr stable. I&O not accurate.   Objective:   Weight Range: 107.3 kg Body mass index is 28.05 kg/m.   Vital Signs:   Temp:  [97.7 F (36.5 C)-98.1 F (36.7 C)] 98.1 F (36.7 C) (12/23 0738) Pulse Rate:  [70-97] 95 (12/23 0738) Resp:  [12-20] 19 (12/23 0738) BP: (95-149)/(64-95) 95/77 (12/23 0738) SpO2:  [94 %-100 %] 97 % (12/23 0738) Weight:  [103.8 kg-107.3 kg] 107.3 kg (12/23 0426)    Weight change: Filed Weights   06/14/24 1149 06/14/24 2100 06/15/24 0426  Weight: 103.8 kg 107.3 kg 107.3 kg    Intake/Output:   Intake/Output Summary (Last 24 hours) at 06/15/2024 1022 Last data filed at 06/15/2024 0739 Gross per 24 hour  Intake 240 ml  Output 900 ml  Net -660 ml     Physical Exam   General:  well appearing.  sitting on EOB Cor: Regular rate & irregular rhythm. No murmurs. JVD ~12 cm.  Lungs: clear, diminished bases Extremities: +1 BLE edema, tight to touch  Telemetry   A fib 80s (Personally reviewed)    Labs   CBC Recent Labs    06/14/24 1241 06/15/24 0237  WBC 5.4 5.2  NEUTROABS 2.9  --   HGB 10.1* 9.5*  HCT 33.3* 30.4*  MCV 93.0 90.7  PLT 433* 378   Basic Metabolic Panel Recent Labs    87/77/74 1241 06/15/24 0237  NA 139 140  K 4.4 3.7  CL 100 101  CO2 28 26  GLUCOSE 78 76  BUN 50* 53*  CREATININE 3.06* 3.00*  CALCIUM  9.9 9.5  MG  --  2.1  PHOS  --  4.2   Liver Function Tests Recent Labs     06/14/24 1241 06/15/24 0237  AST 33 27  ALT 40 23  ALKPHOS 152* 135*  BILITOT 0.9 0.8  PROT 7.4 6.5  ALBUMIN  4.1 3.5   No results for input(s): LIPASE, AMYLASE in the last 72 hours. Cardiac Enzymes No results for input(s): CKTOTAL, CKMB, CKMBINDEX, TROPONINI in the last 72 hours.  BNP: BNP (last 3 results) Recent Labs    03/24/24 1624 04/17/24 1704  BNP 1,093.8* 1,324.0*    ProBNP (last 3 results) Recent Labs    03/12/24 0942 06/14/24 1241  PROBNP 5,364.0* 8,130.0*     D-Dimer No results for input(s): DDIMER in the last 72 hours. Hemoglobin A1C No results for input(s): HGBA1C in the last 72 hours. Fasting Lipid Panel No results for input(s): CHOL, HDL, LDLCALC, TRIG, CHOLHDL, LDLDIRECT in the last 72 hours. Medications:   Scheduled Medications:  apixaban   5 mg Oral BID   buPROPion   300 mg Oral Daily   docusate sodium   100 mg Oral BID   famotidine   20 mg Oral Daily   midodrine   15 mg Oral TID WC   pantoprazole   40 mg Oral Daily   rosuvastatin   20 mg Oral Daily   senna  1 tablet Oral BID   sodium  chloride flush  3 mL Intravenous Q12H    Infusions:  sodium chloride       PRN Medications: sodium chloride , acetaminophen  **OR** acetaminophen , ondansetron  **OR** ondansetron  (ZOFRAN ) IV, oxybutynin , sodium chloride  flush  Assessment/Plan   SOB - s/p thoracentesis 12/22. -1.3L fluid drained.  - SOB much improved, still diminished in bases - Follow with diuresis   End stage combined systolic and diastolic heart failurer: Primarily restrictive cardiomyopathy with biventricular involvement. Severely reduced VO2, TD index. Workup included CMR, biopsy without evidence of cardiac amyloid. Biopsy with hypertrophic nuclei, genetic testing ordered previously. - [facility] referral for txp workup has been sent - NYHA IV on admission.  - Volume remains elevated on exam. Up 13 lbs from last AHF visit. Will increase lasix  to 120 IV BID.  - Continue  midodrine  15mg  TID - Remainder of GDMT limited by renal failure and HF phenotype   Chronic AF - Rate controlled on tele - Duration unknown. Was in AF on all ECGs dating back to 10/24 (was in sinus in 10/24) - Continue Eliquis  5 mg BID   CKD stage IV - SCr 3 - Scheduled at [facility] in January for transplant referral - Follow with diuresis   HTN - BP stable, continue home midodrine  - Avoid hyoptension   Length of Stay: 1  Beckey LITTIE Coe, NP  06/15/2024, 10:22 AM  Advanced Heart Failure Team Pager [phone] (M-F; 7a - 5p)   Please visit [email or web address]: For overnight coverage please call cardiology fellow first. If fellow not available call Shock/ECMO MD on call.  For ECMO / Mechanical Support (Impella, IABP, LVAD) issues call Shock / ECMO MD on call.    Patient seen and examined with the above-signed Advanced Practice Provider and/or Housestaff. I personally reviewed laboratory data, imaging studies and relevant notes. I independently examined the patient and formulated the important aspects of the plan. I have edited the note to reflect any of my changes or salient points. I have personally discussed the plan with the patient and/or family.  On IV lasix . Weight unchanged.   Breathing better  General:  Sitting up in bed. No resp difficulty HEENT: normal Neck: supple. JVP to jaw  Cor: Regular rate & rhythm. No rubs, gallops or murmurs. Lungs: clear Abdomen: soft, nontender, nondistended.Good bowel sounds. Extremities: no cyanosis, clubbing, rash, 2+ edema Neuro: alert & orientedx3, cranial nerves grossly intact. moves all 4 extremities w/o difficulty. Affect pleasant  Remains volume overloaded. Increase IV diuretics. Wrap kegs. Watch rena function and lytes.   Toribio Fuel, MD  12:17 AM   "

## 2024-06-15 NOTE — Plan of Care (Signed)
  Problem: Clinical Measurements: Goal: Ability to maintain clinical measurements within normal limits will improve Outcome: Progressing Goal: Will remain free from infection Outcome: Progressing Goal: Respiratory complications will improve Outcome: Progressing   Problem: Activity: Goal: Risk for activity intolerance will decrease Outcome: Progressing   Problem: Coping: Goal: Level of anxiety will decrease Outcome: Progressing   Problem: Pain Managment: Goal: General experience of comfort will improve and/or be controlled Outcome: Progressing   Problem: Safety: Goal: Ability to remain free from injury will improve Outcome: Progressing

## 2024-06-15 NOTE — Plan of Care (Signed)

## 2024-06-15 NOTE — Progress Notes (Signed)
 Orthopedic Tech Progress Note Patient Details:  Keith Leblanc 03-22-1966 [id number]  [name] to apply UNNA BOOTS patient asked if I could come back a little later since he was getting ready to take a bath. RN is aware and ill return later today   Patient ID: Keith Leblanc, male   DOB: [birth year], 59 y.o.   MRN: [id number]  [name] Pac 06/15/2024, 10:46 AM

## 2024-06-15 NOTE — TOC Initial Note (Signed)
 Transition of Care [facility]) - Initial/Assessment Note    Patient Details  Name: Keith Leblanc MRN: [id number] Date of Birth: 08/26/1965  Transition of Care [facility]) CM/SW Contact:    Arlana JINNY Nicholaus ISRAEL Phone Number: [phone] 06/15/2024, 12:28 PM  Clinical Narrative:       HF CSW met with patient at bedside. Patient stated that he works full-time. Patient stated that he lives alone. Patient stated that he has no history of HH services. Patient stated that he does not use any equipment. Patient stated that he has a scale at home. Patient stated that he has a PCP. CSW explained that a hospital follow up is typically scheduled closer towards dc. Patient is agreeable. Patient stated that he drove himself to the hospital and will transport himself home at dc.   HF CSW/CM will continue to follow and monitor for dc readiness.                   Patient Goals and CMS Choice            Expected Discharge Plan and Services                                              Prior Living Arrangements/Services                       Activities of Daily Living   ADL Screening (condition at time of admission) Independently performs ADLs?: Yes (appropriate for developmental age) Is the patient deaf or have difficulty hearing?: No Does the patient have difficulty seeing, even when wearing glasses/contacts?: No Does the patient have difficulty concentrating, remembering, or making decisions?: No  Permission Sought/Granted                  Emotional Assessment              Admission diagnosis:  Depression, recurrent [F33.9] PAF (paroxysmal atrial fibrillation) (HCC) [I48.0] Pleural effusion [J90] Acute on chronic systolic CHF (congestive heart failure), NYHA class 3 (HCC) [I50.23] Dyspnea, unspecified type [R06.00] Patient Active Problem List   Diagnosis Date Noted   Acute on chronic systolic CHF (congestive heart failure), NYHA class 3 (HCC) 06/14/2024    Iron  deficiency anemia, unspecified 04/26/2024   Benign neoplasm of transverse colon 04/22/2024   Systolic heart failure (HCC) 04/21/2024   Acute on chronic combined systolic and diastolic CHF (congestive heart failure) (HCC) 04/20/2024   Chronic kidney disease (CKD), stage IV (severe) (HCC) 04/20/2024   Gastritis and gastroduodenitis 04/19/2024   Acute on chronic blood loss anemia 04/18/2024   Hematochezia 04/18/2024   GI bleed 04/17/2024   Biventricular congestive heart failure (HCC) 03/13/2024   Acute on chronic diastolic CHF (congestive heart failure) (HCC) 03/12/2024   Alcohol use disorder 03/12/2024   Marijuana abuse 03/12/2024   Controlled diabetes mellitus (HCC) 03/12/2024   Acute renal failure superimposed on stage 4 chronic kidney disease (HCC) 03/12/2024   Paroxysmal A-fib (HCC) 03/12/2024   Dyslipidemia 03/12/2024   Slurred speech 11/09/2022   Stroke (cerebrum) (HCC) 11/09/2022   Hypertriglyceridemia 05/04/2018   Class 1 drug-induced obesity with serious comorbidity and body mass index (BMI) of 32.0 to 32.9 in adult 01/21/2017   Type 2 diabetes mellitus with hyperglycemia (HCC) 08/27/2016   Protein-calorie malnutrition, severe 07/08/2016   Diabetic ketoacidosis without coma associated with type 2 diabetes  mellitus (HCC) 07/07/2016   AKI (acute kidney injury) 07/07/2016   Hyponatremia 07/07/2016   Hyperkalemia 07/07/2016   Obesity (BMI 30-39.9) 07/07/2016   Tobacco abuse 07/07/2016   Overweight (BMI 25.0-29.9)    Melena 05/21/2016   Essential hypertension 04/28/2015   Erectile dysfunction 04/28/2015   Gout 04/28/2015   PCP:  [name], NP Pharmacy:   [facility] - [place], [place] - [phone] [place] [address] Phone: [phone] Fax: [phone]  [facility] [address], [place] - [address] AT [facility] [address] [place] [address] Phone:  [phone] Fax: [phone]     Social Drivers of Health (SDOH) Social History: SDOH Screenings   Food Insecurity: No Food Insecurity (06/14/2024)  Housing: Low Risk (06/14/2024)  Transportation Needs: No Transportation Needs (06/14/2024)  Utilities: Not At Risk (06/14/2024)  Depression (PHQ2-9): Low Risk (03/23/2024)  Social Connections: Moderately Integrated (06/14/2024)  Tobacco Use: Medium Risk (06/14/2024)   SDOH Interventions:     Readmission Risk Interventions    03/15/2024   12:35 PM  Readmission Risk Prevention Plan  Transportation Screening Complete  HRI or Home Care Consult Complete  Social Work Consult for Recovery Care Planning/Counseling Complete  Palliative Care Screening Not Applicable  Medication Review Oceanographer) Referral to Pharmacy

## 2024-06-16 ENCOUNTER — Inpatient Hospital Stay (HOSPITAL_COMMUNITY)

## 2024-06-16 DIAGNOSIS — E1165 Type 2 diabetes mellitus with hyperglycemia: Secondary | ICD-10-CM

## 2024-06-16 DIAGNOSIS — I10 Essential (primary) hypertension: Secondary | ICD-10-CM | POA: Diagnosis not present

## 2024-06-16 DIAGNOSIS — I480 Paroxysmal atrial fibrillation: Secondary | ICD-10-CM | POA: Diagnosis not present

## 2024-06-16 DIAGNOSIS — Z794 Long term (current) use of insulin: Secondary | ICD-10-CM

## 2024-06-16 DIAGNOSIS — M79641 Pain in right hand: Secondary | ICD-10-CM | POA: Diagnosis not present

## 2024-06-16 DIAGNOSIS — I5023 Acute on chronic systolic (congestive) heart failure: Secondary | ICD-10-CM | POA: Diagnosis not present

## 2024-06-16 DIAGNOSIS — Z720 Tobacco use: Secondary | ICD-10-CM | POA: Diagnosis not present

## 2024-06-16 DIAGNOSIS — M1A49X0 Other secondary chronic gout, multiple sites, without tophus (tophi): Secondary | ICD-10-CM | POA: Diagnosis not present

## 2024-06-16 DIAGNOSIS — M1A40X0 Other secondary chronic gout, unspecified site, without tophus (tophi): Secondary | ICD-10-CM | POA: Diagnosis not present

## 2024-06-16 DIAGNOSIS — N184 Chronic kidney disease, stage 4 (severe): Secondary | ICD-10-CM

## 2024-06-16 LAB — BASIC METABOLIC PANEL WITH GFR
Anion gap: 15 (ref 5–15)
BUN: 50 mg/dL — ABNORMAL HIGH (ref 6–20)
CO2: 24 mmol/L (ref 22–32)
Calcium: 9.6 mg/dL (ref 8.9–10.3)
Chloride: 99 mmol/L (ref 98–111)
Creatinine, Ser: 3.17 mg/dL — ABNORMAL HIGH (ref 0.61–1.24)
GFR, Estimated: 22 mL/min — ABNORMAL LOW
Glucose, Bld: 133 mg/dL — ABNORMAL HIGH (ref 70–99)
Potassium: 4.1 mmol/L (ref 3.5–5.1)
Sodium: 137 mmol/L (ref 135–145)

## 2024-06-16 LAB — CBC
HCT: 30.3 % — ABNORMAL LOW (ref 39.0–52.0)
Hemoglobin: 9.5 g/dL — ABNORMAL LOW (ref 13.0–17.0)
MCH: 27.8 pg (ref 26.0–34.0)
MCHC: 31.4 g/dL (ref 30.0–36.0)
MCV: 88.6 fL (ref 80.0–100.0)
Platelets: 392 K/uL (ref 150–400)
RBC: 3.42 MIL/uL — ABNORMAL LOW (ref 4.22–5.81)
RDW: 18.9 % — ABNORMAL HIGH (ref 11.5–15.5)
WBC: 5 K/uL (ref 4.0–10.5)
nRBC: 0 % (ref 0.0–0.2)

## 2024-06-16 LAB — URIC ACID: Uric Acid, Serum: 17.6 mg/dL — ABNORMAL HIGH (ref 3.7–8.6)

## 2024-06-16 MED ORDER — ACETAZOLAMIDE 250 MG PO TABS
500.0000 mg | ORAL_TABLET | Freq: Once | ORAL | Status: AC
  Administered 2024-06-16: 500 mg via ORAL
  Filled 2024-06-16: qty 2

## 2024-06-16 MED ORDER — OXYCODONE HCL 5 MG PO TABS
5.0000 mg | ORAL_TABLET | Freq: Four times a day (QID) | ORAL | Status: AC | PRN
  Administered 2024-06-16 – 2024-06-18 (×3): 5 mg via ORAL
  Filled 2024-06-16 (×3): qty 1

## 2024-06-16 MED ORDER — FUROSEMIDE 10 MG/ML IJ SOLN
160.0000 mg | Freq: Two times a day (BID) | INTRAVENOUS | Status: DC
  Administered 2024-06-16 – 2024-06-17 (×3): 160 mg via INTRAVENOUS
  Filled 2024-06-16: qty 16
  Filled 2024-06-16: qty 10
  Filled 2024-06-16: qty 2
  Filled 2024-06-16 (×2): qty 16

## 2024-06-16 MED ORDER — PREDNISONE 20 MG PO TABS: 20.0000 mg | ORAL_TABLET | Freq: Every day | ORAL | Status: DC

## 2024-06-16 MED ORDER — MILRINONE LACTATE IN DEXTROSE 20-5 MG/100ML-% IV SOLN
0.1250 ug/kg/min | INTRAVENOUS | Status: DC
  Administered 2024-06-16 – 2024-06-20 (×6): 0.125 ug/kg/min via INTRAVENOUS
  Filled 2024-06-16 (×5): qty 100

## 2024-06-16 MED ORDER — ALLOPURINOL 100 MG PO TABS
50.0000 mg | ORAL_TABLET | Freq: Every day | ORAL | Status: DC
  Administered 2024-06-16 – 2024-06-23 (×4): 50 mg via ORAL
  Filled 2024-06-16 (×5): qty 1

## 2024-06-16 MED ORDER — POTASSIUM CHLORIDE CRYS ER 20 MEQ PO TBCR
40.0000 meq | EXTENDED_RELEASE_TABLET | Freq: Once | ORAL | Status: AC
  Administered 2024-06-16: 40 meq via ORAL
  Filled 2024-06-16: qty 2

## 2024-06-16 MED ORDER — PREDNISONE 20 MG PO TABS
20.0000 mg | ORAL_TABLET | Freq: Every day | ORAL | Status: AC
  Administered 2024-06-16 – 2024-06-18 (×3): 20 mg via ORAL
  Filled 2024-06-16 (×3): qty 1

## 2024-06-16 MED ORDER — COLCHICINE 0.6 MG PO TABS
1.2000 mg | ORAL_TABLET | Freq: Once | ORAL | Status: AC
  Administered 2024-06-16: 1.2 mg via ORAL
  Filled 2024-06-16: qty 2

## 2024-06-16 MED ORDER — COLCHICINE 0.6 MG PO TABS
0.6000 mg | ORAL_TABLET | Freq: Two times a day (BID) | ORAL | Status: DC
  Administered 2024-06-17 – 2024-06-18 (×3): 0.6 mg via ORAL
  Filled 2024-06-16 (×3): qty 1

## 2024-06-16 MED ORDER — OXYCODONE HCL 5 MG PO TABS
5.0000 mg | ORAL_TABLET | Freq: Once | ORAL | Status: AC
  Administered 2024-06-16: 5 mg via ORAL
  Filled 2024-06-16: qty 1

## 2024-06-16 NOTE — Hospital Course (Signed)
 Keith Leblanc was admitted to the hospital with the working diagnosis of heart failure   58/M with end-stage diastolic heart failure, former heavy alcoholic, type II DM, CKD 4, history of CVA, paroxysmal A-fib who presented with worsening dyspnea.  Patient reported worsening symptoms for the last 2 weeks prior to admission, positive orthopnea, PND and lower extremity edema. On the day of admission his symptoms were severe prompting him to come to the ED.  Recent hospitalization 10/25 to 05/03/24 for heart failure with low output state. He was discharged on midodrine  and oral torsemide . Referral for [facility] heart and renal transplant.   On his initial physical examination his blood pressure was 121/88, HR 79, RR 16 and 02 saturation 100% on room air.  Lungs with no wheezing or rhonchi, decreased breath sounds at bases, positive JVD, heart with S1 and S2 present irregularly irregular, with no gallops or rubs, abdomen with no distention and positive lower extremity edema   ED sodium 139, creatinine 3.0, albumin  4.1, proBNP 8130, hemoglobin 10, chest x-ray with moderate right pleural effusion. - IR consulted underwent thoracentesis, 1.3 L of clear fluid drained in the right  EKG 78 bpm, right axis deviation, qtc 492, atrial fibrillation rhythm, with no significant ST segment changes,  negative T wave lead V4 to V6.   Patient placed on high dose of loop diuretic, and high dose midodrine .  12/24 added low dose milrinone  for inotropic support.  12/26 right heart catheterization today

## 2024-06-16 NOTE — Assessment & Plan Note (Signed)
Continue blood pressure monitoring.  Blood pressure support with midodrine.

## 2024-06-16 NOTE — Assessment & Plan Note (Signed)
 Continue telemetry monitoring  Anticoagulation with apixaban .

## 2024-06-16 NOTE — Assessment & Plan Note (Signed)
 Continue with colchicine , prednisone  and resume allopurinol .  Uric acid very elevated at 17.6

## 2024-06-16 NOTE — Assessment & Plan Note (Signed)
 AKI  Worsening renal function with serum cr at 3,6 with K at 3,7 and serum bicarbonate at 23 Na 136 and mg 2.1  Continue aggressive diuresis, sequential nephron blockade Inotropic support with milrinone   Follow up renal function and electrolytes in am.

## 2024-06-16 NOTE — Significant Event (Signed)
 Patient is complaining of acute pain involving the metacarpophalangeal joint involving the right index finger with some swelling on exam.  Mildly tender.  Decreased mobility.  X-rays do not show anything acute.  Uric acid level is 17.6.  Patient has has prior history of gout and used to be on allopurinol  which patient discontinued himself about more than a year ago.  I discussed with pharmacy since patient has chronic kidney disease and per pharmacist okay to start colchicine  for acute gout attack.  Redia Cleaver MD.

## 2024-06-16 NOTE — Progress Notes (Signed)
" °  Progress Note   Patient: Keith Leblanc FMW:[id number] DOB: December 25, 1965 DOA: 06/14/2024     2 DOS: the patient was seen and examined on 06/16/2024   Brief hospital course: Keith Leblanc was admitted to the hospital with the working diagnosis of heart failure   58/M with end-stage diastolic CHF, former heavy alcoholic, type II DM, CKD 4, history of CVA, paroxysmal A-fib on Eliquis  presented to the ED with progressive shortness of breath, in the ED sodium 139, creatinine 3.0, albumin  4.1, proBNP 8130, hemoglobin 10, chest x-ray with moderate right pleural effusion. - IR consulted underwent thoracentesis, 1.3 L of clear fluid drained in the right  Assessment and Plan: * Acute on chronic systolic CHF (congestive heart failure) (HCC) Echocardiogram with mildly reduced LV systolic function with EF 40 to 45%, global hypokinesis, severe LVH, grade III diastolic dysfunction, (restrictive), RV systolic function with moderate reduction, mild increased RV wall thickness, LA with mild dilation,   Urine output is 1900 ml Systolic blood pressure 110 mmHg range   Continue diuresis with furosemide  160 mg bid Inotropic support with milrinone  0.125 mcg/kg/min Blood pressure support with midodrine  15 mg tid  12/24 acetazolamide  500 mg x1   Essential hypertension Continue blood pressure monitoring Blood pressure support with midodrine    Paroxysmal A-fib (HCC) Continue telemetry monitoring  Anticoagulation with apixaban .   Chronic kidney disease (CKD), stage IV (severe) (HCC) AKI  Renal function with serum cr at 3,17 with K at 4,1 and serum bicarbonate at 24 Na 137   Continue aggressive diuresis, sequential nephron blockade Follow up renal function and electrolytes in am.   Type 2 diabetes mellitus with hyperglycemia (HCC) Continue glucose cover and monitoring with insulin  sliding scale.  Fasting glucose today 133 mg/dl   Gout Continue with colchicine , prednisone  and resume allopurinol .  Uric  acid very elevated at 17.6   Tobacco abuse Smoking cessation counseling         Subjective: Patient with no chest pain, dyspnea and edema are improving, no nausea or vomiting, today with no PND   Physical Exam: Vitals:   06/16/24 0421 06/16/24 0751 06/16/24 1211 06/16/24 1607  BP:  109/81 113/86 106/78  Pulse:  85 85 88  Resp:  16 17 19   Temp:  97.6 F (36.4 C) 98.1 F (36.7 C) 98.1 F (36.7 C)  TempSrc:  Axillary Oral Oral  SpO2:   96% 94%  Weight: 106 kg     Height:       Neurology awake and alert,  ENT with no pallor or icterus Cardiovascular with S1 and S2 present and regular with no gallops, rubs or murmurs Respiratory with mild rales at bases with no wheezing or rhonchi  Abdomen soft and not distended Lower extremity edema +   Data Reviewed:    Family Communication: no family at the bedside   Disposition: Status is: Inpatient Remains inpatient appropriate because: heart failure   Planned Discharge Destination: Home      Author: Elidia Toribio Furnace, MD 06/16/2024 5:27 PM  For on call review [email or web address]  "

## 2024-06-16 NOTE — Progress Notes (Addendum)
 "    Advanced Heart Failure Rounding Note  Cardiologist: None  [facility] Cardiologist: Dr. Zenaida Chief Complaint: End stage combined systolic and diastolic HF/ SOB Patient Profile   Keith Leblanc is a 58 y.o. male with end stage diastolic heart failure, former heavy drinker, type 2DM, HTN, CKD IIIa, HLD, history of stroke, and PAF on eliquis . Admitted with SOB s/p thoracentesis.    Significant events:   12/22: Admitted w SOB. S/p thoracentesis 1.3L  Subjective:    BP stable on midodrine . Modest UOP with IV lasix . Weight down 3 lbs. Cr stable ~3. Uric acid 17.6  Lying in bed. Biggest compliant is severe gout pain in R hand. No SOB, not peeing like he thought he would.   Objective:    Weight Range: 106 kg Body mass index is 27.71 kg/m.   Vital Signs:   Temp:  [97.6 F (36.4 C)-98.3 F (36.8 C)] 98.1 F (36.7 C) (12/24 0410) Pulse Rate:  [90-99] 90 (12/23 1532) Resp:  [16-19] 16 (12/23 2325) BP: (95-118)/(73-91) 103/73 (12/23 2325) SpO2:  [97 %-100 %] 99 % (12/23 2325) Weight:  [106 kg] 106 kg (12/24 0421) Last BM Date : 06/15/24  Weight change: Filed Weights   06/14/24 2100 06/15/24 0426 06/16/24 0421  Weight: 107.3 kg 107.3 kg 106 kg   Intake/Output:  Intake/Output Summary (Last 24 hours) at 06/16/2024 0654 Last data filed at 06/16/2024 0600 Gross per 24 hour  Intake 364.02 ml  Output 1900 ml  Net -1535.98 ml    Physical Exam   General:  Well appearing.   Cor: Regular rate & rhythm. No murmurs. JVD to jaw cm.  Lungs: clear Extremities: 1+ BLE edema +unna boots + R hand swellling  Telemetry   AF 80s  (personally reviewed)  Labs   CBC Recent Labs    06/14/24 1241 06/15/24 0237 06/16/24 0138  WBC 5.4 5.2 5.0  NEUTROABS 2.9  --   --   HGB 10.1* 9.5* 9.5*  HCT 33.3* 30.4* 30.3*  MCV 93.0 90.7 88.6  PLT 433* 378 392   Basic Metabolic Panel Recent Labs    87/76/74 0237 06/16/24 0138  NA 140 137  K 3.7 4.1  CL 101 99  CO2 26 24  GLUCOSE 76  133*  BUN 53* 50*  CREATININE 3.00* 3.17*  CALCIUM  9.5 9.6  MG 2.1  --   PHOS 4.2  --    Liver Function Tests Recent Labs    06/14/24 1241 06/15/24 0237  AST 33 27  ALT 40 23  ALKPHOS 152* 135*  BILITOT 0.9 0.8  PROT 7.4 6.5  ALBUMIN  4.1 3.5   BNP (last 3 results) Recent Labs    03/24/24 1624 04/17/24 1704  BNP 1,093.8* 1,324.0*   ProBNP (last 3 results) Recent Labs    03/12/24 0942 06/14/24 1241  PROBNP 5,364.0* 8,130.0*   Medications:   Scheduled Medications:  apixaban   5 mg Oral BID   buPROPion   300 mg Oral Daily   [START ON 06/17/2024] colchicine   0.6 mg Oral BID   docusate sodium   100 mg Oral BID   famotidine   20 mg Oral Daily   midodrine   15 mg Oral TID WC   pantoprazole   40 mg Oral Daily   rosuvastatin   20 mg Oral Daily   senna  1 tablet Oral BID   sodium chloride  flush  3 mL Intravenous Q12H    Infusions:  furosemide  120 mg (06/15/24 1801)    PRN Medications: acetaminophen  **OR** acetaminophen , ondansetron  **  OR** ondansetron  (ZOFRAN ) IV, oxybutynin , sodium chloride  flush  Assessment/Plan   SOB - s/p thoracentesis 12/22. -1.3L fluid drained.  - SOB much improved, still diminished in bases - Follow with diuresis   2. End stage combined systolic and diastolic heart failurer: Primarily restrictive cardiomyopathy with biventricular involvement. Severely reduced VO2, TD index. Workup included CMR, biopsy without evidence of cardiac amyloid. Biopsy with hypertrophic nuclei, genetic testing ordered previously. - [facility] referral for txp workup has been sent - NYHA IV on admission.  - JVP significantly elevated. Modest diuresis. Increase lasix  to 160 mg bid, give diamox  500 mg x1 today - Continue midodrine  15mg  TID - Remainder of GDMT limited by renal failure and HF phenotype   Chronic AF - Rate controlled on tele - Duration unknown. Was in AF on all ECGs dating back to 10/24 (was in sinus in 10/24) - Continue Eliquis  5 mg BID   CKD stage IV - SCr  3 - Scheduled at [facility] in January for transplant referral - Follow with diuresis   HTN - BP stable, continue home midodrine  - Avoid hypotension  6. Gout - pain in hands - uric acid 17.6 - colchicine  per primary - add prednisone  burst, 20 mg x3days  Length of Stay: 2  Jordan Lee, NP  06/16/2024, 6:54 AM  Advanced Heart Failure Team Pager [phone] (M-F; 7a - 5p)   Please visit [email or web address]: For overnight coverage please call cardiology fellow first. If fellow not available call Shock/ECMO MD on call.  For ECMO / Mechanical Support (Impella, IABP, LVAD) issues call Shock / ECMO MD on call.   Patient seen and examined with the above-signed Advanced Practice Provider and/or Housestaff. I personally reviewed laboratory data, imaging studies and relevant notes. I independently examined the patient and formulated the important aspects of the plan. I have edited the note to reflect any of my changes or salient points. I have personally discussed the plan with the patient and/or family.  Not diuresing well. Urine dark.   General:  Sitting up in bed. No resp difficulty HEENT: normal Neck: supple JVP to ear Cor: Regular rate & rhythm. No rubs, gallops or murmurs. Lungs: clear Abdomen: soft, nontender, nondistended.Good bowel sounds. Extremities: no cyanosis, clubbing, rash, 2+ edema Neuro: alert & orientedx3, cranial nerves grossly intact. moves all 4 extremities w/o difficulty. Affect pleasant  He remains markedly volume overloaded.   Will add low-dose milrinone  and increase lasix . If no response place PICC for CVP/co-ox monitoring.   Toribio Fuel, MD  5:18 PM   "

## 2024-06-16 NOTE — Plan of Care (Signed)

## 2024-06-16 NOTE — Assessment & Plan Note (Signed)
 Smoking cessation counseling.

## 2024-06-16 NOTE — Assessment & Plan Note (Signed)
 Continue glucose cover and monitoring with insulin  sliding scale.  Fasting glucose today 139 mg/dl

## 2024-06-16 NOTE — Assessment & Plan Note (Addendum)
 Echocardiogram with mildly reduced LV systolic function with EF 40 to 45%, global hypokinesis, severe LVH, grade III diastolic dysfunction, (restrictive), RV systolic function with moderate reduction, mild increased RV wall thickness, LA with mild dilation,   Urine output is documented at 400 ml Systolic blood pressure 100 mmHg range   Inotropic support with milrinone  0.125 mcg/kg/min Blood pressure support with midodrine  15 mg tid  12/24 acetazolamide  500 mg x1   Patient not responding to medical therapy, plan for right heart catheterization today.

## 2024-06-17 DIAGNOSIS — I10 Essential (primary) hypertension: Secondary | ICD-10-CM | POA: Diagnosis not present

## 2024-06-17 DIAGNOSIS — N184 Chronic kidney disease, stage 4 (severe): Secondary | ICD-10-CM | POA: Diagnosis not present

## 2024-06-17 DIAGNOSIS — M1A40X0 Other secondary chronic gout, unspecified site, without tophus (tophi): Secondary | ICD-10-CM

## 2024-06-17 DIAGNOSIS — I480 Paroxysmal atrial fibrillation: Secondary | ICD-10-CM | POA: Diagnosis not present

## 2024-06-17 DIAGNOSIS — I5023 Acute on chronic systolic (congestive) heart failure: Secondary | ICD-10-CM | POA: Diagnosis not present

## 2024-06-17 LAB — BASIC METABOLIC PANEL WITH GFR
Anion gap: 16 — ABNORMAL HIGH (ref 5–15)
BUN: 55 mg/dL — ABNORMAL HIGH (ref 6–20)
CO2: 23 mmol/L (ref 22–32)
Calcium: 9.5 mg/dL (ref 8.9–10.3)
Chloride: 97 mmol/L — ABNORMAL LOW (ref 98–111)
Creatinine, Ser: 3.66 mg/dL — ABNORMAL HIGH (ref 0.61–1.24)
GFR, Estimated: 19 mL/min — ABNORMAL LOW
Glucose, Bld: 139 mg/dL — ABNORMAL HIGH (ref 70–99)
Potassium: 3.7 mmol/L (ref 3.5–5.1)
Sodium: 136 mmol/L (ref 135–145)

## 2024-06-17 LAB — MAGNESIUM: Magnesium: 2.1 mg/dL (ref 1.7–2.4)

## 2024-06-17 MED ORDER — SODIUM CHLORIDE 0.9 % IV SOLN: 250.0000 mL | INTRAVENOUS | Status: DC | PRN

## 2024-06-17 MED ORDER — SODIUM CHLORIDE 0.9% FLUSH: 3.0000 mL | INTRAVENOUS | Status: DC | PRN

## 2024-06-17 MED ORDER — POTASSIUM CHLORIDE CRYS ER 20 MEQ PO TBCR
40.0000 meq | EXTENDED_RELEASE_TABLET | Freq: Once | ORAL | Status: AC
  Administered 2024-06-17: 40 meq via ORAL
  Filled 2024-06-17: qty 2

## 2024-06-17 MED ORDER — SODIUM CHLORIDE 0.9% FLUSH
3.0000 mL | Freq: Two times a day (BID) | INTRAVENOUS | Status: DC
  Administered 2024-06-18: 3 mL via INTRAVENOUS

## 2024-06-17 MED ORDER — ASPIRIN 81 MG PO CHEW
81.0000 mg | CHEWABLE_TABLET | ORAL | Status: AC
  Administered 2024-06-18: 81 mg via ORAL
  Filled 2024-06-17: qty 1

## 2024-06-17 NOTE — Plan of Care (Signed)

## 2024-06-17 NOTE — Progress Notes (Signed)
 "    Advanced Heart Failure Rounding Note  Cardiologist: None  [facility] Cardiologist: Dr. Zenaida Chief Complaint: End stage combined systolic and diastolic HF/ SOB Patient Profile   Keith Leblanc is a 58 y.o. male with end stage diastolic heart failure, former heavy drinker, type 2DM, HTN, CKD IIIa, HLD, history of stroke, and PAF on eliquis . Admitted with SOB s/p thoracentesis.    Significant events:   12/22: Admitted w SOB. S/p thoracentesis 1.3L  Subjective:    Remains on milrinone  and IV lasix . Not diuresing. SCR worse  Objective:    Weight Range: 104.4 kg Body mass index is 27.29 kg/m.   Vital Signs:   Temp:  [97 F (36.1 C)-98.6 F (37 C)] 98.2 F (36.8 C) (12/25 1956) Pulse Rate:  [64-79] 75 (12/25 1956) Resp:  [12-19] 19 (12/25 1635) BP: (98-120)/(69-83) 120/83 (12/25 1956) SpO2:  [94 %-100 %] 98 % (12/25 1956) Weight:  [104.4 kg] 104.4 kg (12/25 0306) Last BM Date : 06/15/24  Weight change: Filed Weights   06/15/24 0426 06/16/24 0421 06/17/24 0306  Weight: 107.3 kg 106 kg 104.4 kg   Intake/Output:  Intake/Output Summary (Last 24 hours) at 06/17/2024 2008 Last data filed at 06/17/2024 1000 Gross per 24 hour  Intake --  Output 700 ml  Net -700 ml    Physical Exam   General:  Sitting up in bed. No resp difficulty HEENT: normal Neck: supple. JVP to ear  Cor: Irregular rate & rhythm. No rubs, gallops or murmurs. Lungs: clear Abdomen: soft, nontender, nondistended.Good bowel sounds. Extremities: no cyanosis, clubbing, rash, 2+ edema Neuro: alert & orientedx3, cranial nerves grossly intact. moves all 4 extremities w/o difficulty. Affect pleasant   Telemetry   AF 70-80s  (personally reviewed)  Labs   CBC Recent Labs    06/15/24 0237 06/16/24 0138  WBC 5.2 5.0  HGB 9.5* 9.5*  HCT 30.4* 30.3*  MCV 90.7 88.6  PLT 378 392   Basic Metabolic Panel Recent Labs    87/76/74 0237 06/16/24 0138 06/17/24 0243  NA 140 137 136  K 3.7 4.1 3.7  CL  101 99 97*  CO2 26 24 23   GLUCOSE 76 133* 139*  BUN 53* 50* 55*  CREATININE 3.00* 3.17* 3.66*  CALCIUM  9.5 9.6 9.5  MG 2.1  --  2.1  PHOS 4.2  --   --    Liver Function Tests Recent Labs    06/15/24 0237  AST 27  ALT 23  ALKPHOS 135*  BILITOT 0.8  PROT 6.5  ALBUMIN  3.5   BNP (last 3 results) Recent Labs    03/24/24 1624 04/17/24 1704  BNP 1,093.8* 1,324.0*   ProBNP (last 3 results) Recent Labs    03/12/24 0942 06/14/24 1241  PROBNP 5,364.0* 8,130.0*   Medications:   Scheduled Medications:  allopurinol   50 mg Oral Daily   apixaban   5 mg Oral BID   buPROPion   300 mg Oral Daily   colchicine   0.6 mg Oral BID   docusate sodium   100 mg Oral BID   famotidine   20 mg Oral Daily   midodrine   15 mg Oral TID WC   pantoprazole   40 mg Oral Daily   predniSONE   20 mg Oral Q breakfast   rosuvastatin   20 mg Oral Daily   senna  1 tablet Oral BID   sodium chloride  flush  3 mL Intravenous Q12H    Infusions:  milrinone  0.125 mcg/kg/min (06/17/24 1230)    PRN Medications: acetaminophen  **OR** acetaminophen , ondansetron  **  OR** ondansetron  (ZOFRAN ) IV, oxybutynin , oxyCODONE , sodium chloride  flush  Assessment/Plan    1. End stage combined acute on chronic systolic and diastolic heart failurer: Primarily restrictive cardiomyopathy with biventricular involvement. Severely reduced VO2, TD index. Workup included CMR, biopsy without evidence of cardiac amyloid. Biopsy with hypertrophic nuclei, genetic testing ordered previously. - Echo 10/25 EF 40% RV moderately down  - [facility] referral for txp workup has been sent - NYHA IV on admission.  - Appears volume overloaded but not responding to milrinone  and IV lasix .  - On midodrine  for BP support - Will stop lasix . Plan RHC tomorrow.  - Suspect we may have to move to stop midodrine  and move to ICU for NE support post RHC   Chronic AF - Rate controlled - Duration unknown. Was in AF on all ECGs dating back to 10/24 (was in sinus in  10/24) - Continue Eliquis  5 mg BID   Acute on CKD stage IV - SCr 3 - Scheduled at [facility] in January for transplant referral - Plan as above   Pleural effusion   - s/p tap this admit  6. Gout - pain in hands - uric acid 17.6 - colchicine  per primary - on prednisone  burst, 20 mg x3days  Length of Stay: 3  Toribio Fuel, MD  06/17/2024, 8:08 PM  Advanced Heart Failure Team Pager [phone] (M-F; 7a - 5p)   Please visit [email or web address]: For overnight coverage please call cardiology fellow first. If fellow not available call Shock/ECMO MD on call.  For ECMO / Mechanical Support (Impella, IABP, LVAD) issues call Shock / ECMO MD on call.    "

## 2024-06-17 NOTE — Plan of Care (Signed)
   Problem: Education: Goal: Knowledge of General Education information will improve Description: Including pain rating scale, medication(s)/side effects and non-pharmacologic comfort measures Outcome: Progressing   Problem: Clinical Measurements: Goal: Ability to maintain clinical measurements within normal limits will improve Outcome: Progressing

## 2024-06-17 NOTE — Progress Notes (Signed)
 " Progress Note   Patient: Keith Leblanc FMW:[id number] DOB: 11-05-1965 DOA: 06/14/2024     3 DOS: the patient was seen and examined on 06/17/2024   Brief hospital course: Keith Leblanc was admitted to the hospital with the working diagnosis of heart failure   58/M with end-stage diastolic heart failure, former heavy alcoholic, type II DM, CKD 4, history of CVA, paroxysmal A-fib who presented with worsening dyspnea.  Patient reported worsening symptoms for the last 2 weeks prior to admission, positive orthopnea, PND and lower extremity edema. On the day of admission his symptoms were severe prompting him to come to the ED.  Recent hospitalization 10/25 to 05/03/24 for heart failure with low output state. He was discharged on midodrine  and oral torsemide . Referral for [facility] heart and renal transplant.   On his initial physical examination his blood pressure was 121/88, HR 79, RR 16 and 02 saturation 100% on room air.  Lungs with no wheezing or rhonchi, decreased breath sounds at bases, positive JVD, heart with S1 and S2 present irregularly irregular, with no gallops or rubs, abdomen with no distention and positive lower extremity edema   ED sodium 139, creatinine 3.0, albumin  4.1, proBNP 8130, hemoglobin 10, chest x-ray with moderate right pleural effusion. - IR consulted underwent thoracentesis, 1.3 L of clear fluid drained in the right  EKG 78 bpm, right axis deviation, qtc 492, atrial fibrillation rhythm, with no significant ST segment changes,  negative T wave lead V4 to V6.   Patient placed on high dose of loop diuretic, and high dose midodrine .  12/24 added low dose milrinone  for inotropic support.    Assessment and Plan: * Acute on chronic systolic CHF (congestive heart failure) (HCC) Echocardiogram with mildly reduced LV systolic function with EF 40 to 45%, global hypokinesis, severe LVH, grade III diastolic dysfunction, (restrictive), RV systolic function with moderate reduction,  mild increased RV wall thickness, LA with mild dilation,   Urine output is 1400 ml Systolic blood pressure 100 mmHg range   Continue diuresis with furosemide  160 mg bid Inotropic support with milrinone  0.125 mcg/kg/min Blood pressure support with midodrine  15 mg tid  12/24 acetazolamide  500 mg x1   Essential hypertension Continue blood pressure monitoring Blood pressure support with midodrine    Paroxysmal A-fib (HCC) Continue telemetry monitoring  Anticoagulation with apixaban .   Chronic kidney disease (CKD), stage IV (severe) (HCC) AKI  Worsening renal function with serum cr at 3,6 with K at 3,7 and serum bicarbonate at 23 Na 136 and mg 2.1  Continue aggressive diuresis, sequential nephron blockade Inotropic support with milrinone   Follow up renal function and electrolytes in am.   Type 2 diabetes mellitus with hyperglycemia (HCC) Continue glucose cover and monitoring with insulin  sliding scale.  Fasting glucose today 139 mg/dl   Gout Continue with colchicine , prednisone  and resume allopurinol .  Uric acid very elevated at 17.6   Tobacco abuse Smoking cessation counseling      Subjective: patient with no chest pain, dyspnea and edema have improved, no chest pain or palpitations.   Physical Exam: Vitals:   06/16/24 1941 06/16/24 2320 06/17/24 0306 06/17/24 0801  BP: 102/69 101/69 98/70 106/77  Pulse: 81 77 79 64  Resp:  12 18 17   Temp: 97.9 F (36.6 C) 98 F (36.7 C) 98 F (36.7 C) (!) 97 F (36.1 C)  TempSrc: Oral Oral Oral Oral  SpO2: 96% 94% 95% 100%  Weight:   104.4 kg   Height:  Neurology awake and alert ENT with mild pallor Cardiovascular with S1 and S2 present irregularly irregular with no gallops, rubs or murmurs Mild JVD Respiratory with no rales or wheezing, no rhonchi  Abdomen soft and non tender Trace lower extremity edema, unna boots in place   Data Reviewed:    Family Communication: family at the bedside   Disposition: Status  is: Inpatient Remains inpatient appropriate because: Inotropic support   Planned Discharge Destination: Home     Author: Elidia Toribio Furnace, MD 06/17/2024 11:44 AM  For on call review [email or web address].  "

## 2024-06-18 ENCOUNTER — Inpatient Hospital Stay (HOSPITAL_COMMUNITY)
Admission: EM | Disposition: A | Discharge status: Other Acute Inpatient Hospital | Payer: Self-pay | Source: Home / Self Care | Attending: Critical Care Medicine

## 2024-06-18 ENCOUNTER — Encounter (HOSPITAL_COMMUNITY): Payer: Self-pay | Admitting: Family Medicine

## 2024-06-18 DIAGNOSIS — I5023 Acute on chronic systolic (congestive) heart failure: Secondary | ICD-10-CM | POA: Diagnosis present

## 2024-06-18 DIAGNOSIS — N185 Chronic kidney disease, stage 5: Secondary | ICD-10-CM

## 2024-06-18 DIAGNOSIS — I482 Chronic atrial fibrillation, unspecified: Secondary | ICD-10-CM

## 2024-06-18 DIAGNOSIS — N179 Acute kidney failure, unspecified: Secondary | ICD-10-CM

## 2024-06-18 DIAGNOSIS — R570 Cardiogenic shock: Secondary | ICD-10-CM

## 2024-06-18 DIAGNOSIS — I132 Hypertensive heart and chronic kidney disease with heart failure and with stage 5 chronic kidney disease, or end stage renal disease: Principal | ICD-10-CM

## 2024-06-18 DIAGNOSIS — I10 Essential (primary) hypertension: Secondary | ICD-10-CM | POA: Diagnosis not present

## 2024-06-18 DIAGNOSIS — N184 Chronic kidney disease, stage 4 (severe): Secondary | ICD-10-CM | POA: Diagnosis not present

## 2024-06-18 DIAGNOSIS — I480 Paroxysmal atrial fibrillation: Secondary | ICD-10-CM | POA: Diagnosis not present

## 2024-06-18 DIAGNOSIS — I1310 Hypertensive heart and chronic kidney disease without heart failure, with stage 1 through stage 4 chronic kidney disease, or unspecified chronic kidney disease: Secondary | ICD-10-CM | POA: Diagnosis present

## 2024-06-18 HISTORY — PX: TEMPORARY DIALYSIS CATHETER: CATH118312

## 2024-06-18 HISTORY — PX: RIGHT HEART CATH: CATH118263

## 2024-06-18 LAB — POCT I-STAT EG7
Acid-base deficit: 1 mmol/L (ref 0.0–2.0)
Acid-base deficit: 1 mmol/L (ref 0.0–2.0)
Bicarbonate: 23.7 mmol/L (ref 20.0–28.0)
Bicarbonate: 24 mmol/L (ref 20.0–28.0)
Calcium, Ion: 1.17 mmol/L (ref 1.15–1.40)
Calcium, Ion: 1.2 mmol/L (ref 1.15–1.40)
HCT: 32 % — ABNORMAL LOW (ref 39.0–52.0)
HCT: 32 % — ABNORMAL LOW (ref 39.0–52.0)
Hemoglobin: 10.9 g/dL — ABNORMAL LOW (ref 13.0–17.0)
Hemoglobin: 10.9 g/dL — ABNORMAL LOW (ref 13.0–17.0)
O2 Saturation: 56 %
O2 Saturation: 57 %
Potassium: 3.8 mmol/L (ref 3.5–5.1)
Potassium: 3.8 mmol/L (ref 3.5–5.1)
Sodium: 138 mmol/L (ref 135–145)
Sodium: 139 mmol/L (ref 135–145)
TCO2: 25 mmol/L (ref 22–32)
TCO2: 25 mmol/L (ref 22–32)
pCO2, Ven: 39.1 mmHg — ABNORMAL LOW (ref 44–60)
pCO2, Ven: 39.7 mmHg — ABNORMAL LOW (ref 44–60)
pH, Ven: 7.384 (ref 7.25–7.43)
pH, Ven: 7.396 (ref 7.25–7.43)
pO2, Ven: 30 mmHg — CL (ref 32–45)
pO2, Ven: 30 mmHg — CL (ref 32–45)

## 2024-06-18 LAB — MRSA NEXT GEN BY PCR, NASAL: MRSA by PCR Next Gen: NOT DETECTED

## 2024-06-18 LAB — BASIC METABOLIC PANEL WITH GFR
Anion gap: 20 — ABNORMAL HIGH (ref 5–15)
BUN: 56 mg/dL — ABNORMAL HIGH (ref 6–20)
CO2: 19 mmol/L — ABNORMAL LOW (ref 22–32)
Calcium: 9.8 mg/dL (ref 8.9–10.3)
Chloride: 97 mmol/L — ABNORMAL LOW (ref 98–111)
Creatinine, Ser: 4.07 mg/dL — ABNORMAL HIGH (ref 0.61–1.24)
GFR, Estimated: 16 mL/min — ABNORMAL LOW
Glucose, Bld: 112 mg/dL — ABNORMAL HIGH (ref 70–99)
Potassium: 3.7 mmol/L (ref 3.5–5.1)
Sodium: 136 mmol/L (ref 135–145)

## 2024-06-18 LAB — GLUCOSE, CAPILLARY
Glucose-Capillary: 145 mg/dL — ABNORMAL HIGH (ref 70–99)
Glucose-Capillary: 150 mg/dL — ABNORMAL HIGH (ref 70–99)

## 2024-06-18 LAB — HEMOGLOBIN A1C
Hgb A1c MFr Bld: 5.5 % (ref 4.8–5.6)
Mean Plasma Glucose: 111.15 mg/dL

## 2024-06-18 MED ORDER — LIDOCAINE HCL (PF) 1 % IJ SOLN
INTRAMUSCULAR | Status: DC | PRN
  Administered 2024-06-18 (×2): 5 mL

## 2024-06-18 MED ORDER — NOREPINEPHRINE 4 MG/250ML-% IV SOLN: 0.0000 ug/min | INTRAVENOUS | Status: DC

## 2024-06-18 MED ORDER — SODIUM CHLORIDE 0.9% FLUSH
10.0000 mL | Freq: Two times a day (BID) | INTRAVENOUS | Status: DC
  Administered 2024-06-18: 10 mL
  Administered 2024-06-19: 40 mL
  Administered 2024-06-20 – 2024-06-21 (×2): 20 mL
  Administered 2024-06-22 (×2): 10 mL
  Administered 2024-06-23: 40 mL

## 2024-06-18 MED ORDER — SODIUM CHLORIDE 0.9% FLUSH
10.0000 mL | INTRAVENOUS | Status: DC | PRN
  Administered 2024-06-20: 20 mL

## 2024-06-18 MED ORDER — SODIUM CHLORIDE 0.9 % IV SOLN: INTRAVENOUS | Status: AC | PRN

## 2024-06-18 MED ORDER — PRISMASOL BGK 4/2.5 32-4-2.5 MEQ/L EC SOLN: Status: DC

## 2024-06-18 MED ORDER — FENTANYL CITRATE (PF) 100 MCG/2ML IJ SOLN
INTRAMUSCULAR | Status: AC
  Filled 2024-06-18: qty 2

## 2024-06-18 MED ORDER — INSULIN ASPART 100 UNIT/ML IJ SOLN
1.0000 [IU] | Freq: Three times a day (TID) | INTRAMUSCULAR | Status: DC
  Administered 2024-06-19 – 2024-06-20 (×5): 1 [IU] via SUBCUTANEOUS
  Administered 2024-06-21: 2 [IU] via SUBCUTANEOUS
  Administered 2024-06-21 – 2024-06-23 (×4): 1 [IU] via SUBCUTANEOUS
  Filled 2024-06-18 (×5): qty 1

## 2024-06-18 MED ORDER — MIDAZOLAM HCL (PF) 2 MG/2ML IJ SOLN
INTRAMUSCULAR | Status: DC | PRN
  Administered 2024-06-18 (×2): 1 mg via INTRAVENOUS

## 2024-06-18 MED ORDER — CHLORHEXIDINE GLUCONATE CLOTH 2 % EX PADS
6.0000 | MEDICATED_PAD | Freq: Every day | CUTANEOUS | Status: DC
  Administered 2024-06-18 – 2024-06-23 (×6): 6 via TOPICAL

## 2024-06-18 MED ORDER — FENTANYL CITRATE (PF) 100 MCG/2ML IJ SOLN
INTRAMUSCULAR | Status: DC | PRN
  Administered 2024-06-18: 25 ug via INTRAVENOUS

## 2024-06-18 MED ORDER — MIDAZOLAM HCL 2 MG/2ML IJ SOLN
INTRAMUSCULAR | Status: AC
  Filled 2024-06-18: qty 2

## 2024-06-18 MED ORDER — HEPARIN SODIUM (PORCINE) 1000 UNIT/ML DIALYSIS
1000.0000 [IU] | INTRAMUSCULAR | Status: DC | PRN
  Administered 2024-06-21: 3000 [IU] via INTRAVENOUS_CENTRAL
  Filled 2024-06-18: qty 3

## 2024-06-18 MED ORDER — HEPARIN (PORCINE) IN NACL 1000-0.9 UT/500ML-% IV SOLN
INTRAVENOUS | Status: DC | PRN
  Administered 2024-06-18: 500 mL via SURGICAL_CAVITY

## 2024-06-18 MED ORDER — HEPARIN SODIUM (PORCINE) 1000 UNIT/ML IJ SOLN
INTRAMUSCULAR | Status: AC
  Filled 2024-06-18: qty 10

## 2024-06-18 MED ORDER — LIDOCAINE HCL (PF) 1 % IJ SOLN
INTRAMUSCULAR | Status: AC
  Filled 2024-06-18: qty 30

## 2024-06-18 MED ORDER — COLCHICINE 0.6 MG PO TABS
0.6000 mg | ORAL_TABLET | Freq: Every day | ORAL | Status: DC
  Filled 2024-06-18: qty 1

## 2024-06-18 MED ORDER — ROSUVASTATIN CALCIUM 5 MG PO TABS
10.0000 mg | ORAL_TABLET | Freq: Every day | ORAL | Status: DC
  Administered 2024-06-18 – 2024-06-23 (×6): 10 mg via ORAL
  Filled 2024-06-18 (×3): qty 2

## 2024-06-18 MED ORDER — SODIUM CHLORIDE 0.9 % FOR CRRT: INTRAVENOUS_CENTRAL | Status: DC | PRN

## 2024-06-18 NOTE — Progress Notes (Signed)
 "    Advanced Heart Failure Rounding Note  Cardiologist: None  AHF Cardiologist: Dr. Zenaida Chief Complaint: End stage combined systolic and diastolic HF/ SOB Patient Profile   Keith Leblanc is a 58 y.o. male with end stage diastolic heart failure, former heavy drinker, type 2DM, HTN, CKD IIIa, HLD, history of stroke, and PAF on eliquis . Admitted with SOB s/p thoracentesis.    Significant events:   12/22: Admitted w SOB. S/p thoracentesis 1.3L  Subjective:    sCr rising with poor UOP on milrinone . Off IV lasix  RHC today.   Objective:    Weight Range: 103.1 kg Body mass index is 26.95 kg/m.   Vital Signs:   Temp:  [97 F (36.1 C)-98.6 F (37 C)] 98.2 F (36.8 C) (12/26 0349) Pulse Rate:  [64-79] 79 (12/26 0349) Resp:  [17-19] 18 (12/26 0349) BP: (98-120)/(69-83) 106/71 (12/26 0349) SpO2:  [94 %-100 %] 94 % (12/26 0349) Weight:  [103.1 kg] 103.1 kg (12/26 0604) Last BM Date : 06/16/24  Weight change: Filed Weights   06/16/24 0421 06/17/24 0306 06/18/24 0604  Weight: 106 kg 104.4 kg 103.1 kg   Intake/Output:  Intake/Output Summary (Last 24 hours) at 06/18/2024 0658 Last data filed at 06/17/2024 2100 Gross per 24 hour  Intake 360 ml  Output 400 ml  Net -40 ml    Physical Exam   General:  Sitting up in bed. No resp difficulty HEENT: normal Neck: supple.JVP to ear Cor: Irregular rate & rhythm. No rubs, gallops or murmurs. Lungs: clear Abdomen: soft, nontender, nondistended.Good bowel sounds. Extremities: no cyanosis, clubbing, rash,2+  edema Neuro: alert & orientedx3, cranial nerves grossly intact. moves all 4 extremities w/o difficulty. Affect pleasant   Telemetry   AF 70-80s  (personally reviewed)  Labs   CBC Recent Labs    06/16/24 0138  WBC 5.0  HGB 9.5*  HCT 30.3*  MCV 88.6  PLT 392   Basic Metabolic Panel Recent Labs    87/74/74 0243 06/18/24 0222  NA 136 136  K 3.7 3.7  CL 97* 97*  CO2 23 19*  GLUCOSE 139* 112*  BUN 55* 56*   CREATININE 3.66* 4.07*  CALCIUM  9.5 9.8  MG 2.1  --    Liver Function Tests No results for input(s): AST, ALT, ALKPHOS, BILITOT, PROT, ALBUMIN  in the last 72 hours.  BNP (last 3 results) Recent Labs    03/24/24 1624 04/17/24 1704  BNP 1,093.8* 1,324.0*   ProBNP (last 3 results) Recent Labs    03/12/24 0942 06/14/24 1241  PROBNP 5,364.0* 8,130.0*   Medications:   Scheduled Medications:  allopurinol   50 mg Oral Daily   apixaban   5 mg Oral BID   buPROPion   300 mg Oral Daily   colchicine   0.6 mg Oral BID   docusate sodium   100 mg Oral BID   famotidine   20 mg Oral Daily   midodrine   15 mg Oral TID WC   pantoprazole   40 mg Oral Daily   rosuvastatin   20 mg Oral Daily   senna  1 tablet Oral BID   sodium chloride  flush  3 mL Intravenous Q12H    Infusions:  sodium chloride      milrinone  0.125 mcg/kg/min (06/17/24 1230)    PRN Medications: sodium chloride , acetaminophen  **OR** acetaminophen , ondansetron  **OR** ondansetron  (ZOFRAN ) IV, oxybutynin , oxyCODONE , sodium chloride  flush  Assessment/Plan   1. End stage combined acute on chronic systolic and diastolic heart failurer: Primarily restrictive cardiomyopathy with biventricular involvement. Severely reduced VO2, TD index. Workup  included CMR, biopsy without evidence of cardiac amyloid. Biopsy with hypertrophic nuclei, genetic testing ordered previously. - Echo 10/25 EF 40% RV moderately down  - [facility] referral for txp workup has been sent - NYHA IV on admission.  - Appears volume overloaded but not responding to milrinone  and IV lasix .  - On midodrine  for BP support - Will stop lasix . Plan RHC tomorrow.  - Suspect we may have to move to stop midodrine  and move to ICU for NE support post RHC   Chronic AF - Rate controlled - Duration unknown. Was in AF on all ECGs dating back to 10/24 (was in sinus in 10/24) - Continue Eliquis  5 mg BID   Acute on CKD stage IV - SCr 3 - Scheduled at [facility] in January for  transplant referral - Plan as above   Pleural effusion   - s/p tap this admit  6. Gout - pain in hands - uric acid 17.6 - colchicine  per primary - on prednisone  burst, 20 mg x3days  Length of Stay: 4  Jordan Lee, NP  06/18/2024, 6:58 AM  Advanced Heart Failure Team Pager [phone] (M-F; 7a - 5p)   Please visit [email or web address]: For overnight coverage please call cardiology fellow first. If fellow not available call Shock/ECMO MD on call.  For ECMO / Mechanical Support (Impella, IABP, LVAD) issues call Shock / ECMO MD on call.   Agree with above.   Continues to deteriorate despite milrinone  support  General:  Sitting up in bed. No resp difficulty HEENT: normal Neck: supple. JVP to ear  Cor: Irregular rate & rhythm. Lungs: clear Abdomen: soft, nontender, nondistended.Good bowel sounds. Extremities: no cyanosis, clubbing, rash,2-3+  edema Neuro: alert & orientedx3, cranial nerves grossly intact. moves all 4 extremities w/o difficulty. Affect pleasant  He is getting progressively worse. Will take for RHC/swan placement and likely move to ICU. May need CVVHD.   CRITICAL CARE Performed by: Cherrie Sieving  Total critical care time: 35 minutes  Critical care time was exclusive of separately billable procedures and treating other patients.  Critical care was necessary to treat or prevent imminent or life-threatening deterioration.  Critical care was time spent personally by me (independent of midlevel providers or residents) on the following activities: development of treatment plan with patient and/or surrogate as well as nursing, discussions with consultants, evaluation of patient's response to treatment, examination of patient, obtaining history from patient or surrogate, ordering and performing treatments and interventions, ordering and review of laboratory studies, ordering and review of radiographic studies, pulse oximetry and re-evaluation of patient's condition.  Sieving Cherrie, MD  12:23 PM    "

## 2024-06-18 NOTE — TOC Progression Note (Signed)
 Transition of Care [facility]) - Progression Note    Patient Details  Name: Keith Leblanc MRN: [id number] Date of Birth: 09-Feb-1966  Transition of [facility]) CM/SW Contact  Arlana JINNY Nicholaus ISRAEL Phone Number: [phone] 06/18/2024, 10:07 AM  Clinical Narrative:  Patient is not medically ready for dc. Will continue to fo;low and monitor for dc readiness.   HF CSW/CM will continue to follow and monitor for dc readiness.                       Expected Discharge Plan and Services                                               Social Drivers of Health (SDOH) Interventions SDOH Screenings   Food Insecurity: No Food Insecurity (06/14/2024)  Housing: Low Risk (06/14/2024)  Transportation Needs: No Transportation Needs (06/14/2024)  Utilities: Not At Risk (06/14/2024)  Depression (PHQ2-9): Low Risk (03/23/2024)  Social Connections: Moderately Integrated (06/14/2024)  Tobacco Use: Medium Risk (06/14/2024)    Readmission Risk Interventions    03/15/2024   12:35 PM  Readmission Risk Prevention Plan  Transportation Screening Complete  HRI or Home Care Consult Complete  Social Work Consult for Recovery Care Planning/Counseling Complete  Palliative Care Screening Not Applicable  Medication Review Oceanographer) Referral to Pharmacy

## 2024-06-18 NOTE — H&P (View-Only) (Signed)
 "    Advanced Heart Failure Rounding Note  Cardiologist: None  AHF Cardiologist: Dr. Zenaida Chief Complaint: End stage combined systolic and diastolic HF/ SOB Patient Profile   Keith Leblanc is a 58 y.o. male with end stage diastolic heart failure, former heavy drinker, type 2DM, HTN, CKD IIIa, HLD, history of stroke, and PAF on eliquis . Admitted with SOB s/p thoracentesis.    Significant events:   12/22: Admitted w SOB. S/p thoracentesis 1.3L  Subjective:    sCr rising with poor UOP on milrinone . Off IV lasix  RHC today.   Objective:    Weight Range: 103.1 kg Body mass index is 26.95 kg/m.   Vital Signs:   Temp:  [97 F (36.1 C)-98.6 F (37 C)] 98.2 F (36.8 C) (12/26 0349) Pulse Rate:  [64-79] 79 (12/26 0349) Resp:  [17-19] 18 (12/26 0349) BP: (98-120)/(69-83) 106/71 (12/26 0349) SpO2:  [94 %-100 %] 94 % (12/26 0349) Weight:  [103.1 kg] 103.1 kg (12/26 0604) Last BM Date : 06/16/24  Weight change: Filed Weights   06/16/24 0421 06/17/24 0306 06/18/24 0604  Weight: 106 kg 104.4 kg 103.1 kg   Intake/Output:  Intake/Output Summary (Last 24 hours) at 06/18/2024 0658 Last data filed at 06/17/2024 2100 Gross per 24 hour  Intake 360 ml  Output 400 ml  Net -40 ml    Physical Exam   General:  Sitting up in bed. No resp difficulty HEENT: normal Neck: supple.JVP to ear Cor: Irregular rate & rhythm. No rubs, gallops or murmurs. Lungs: clear Abdomen: soft, nontender, nondistended.Good bowel sounds. Extremities: no cyanosis, clubbing, rash,2+  edema Neuro: alert & orientedx3, cranial nerves grossly intact. moves all 4 extremities w/o difficulty. Affect pleasant   Telemetry   AF 70-80s  (personally reviewed)  Labs   CBC Recent Labs    06/16/24 0138  WBC 5.0  HGB 9.5*  HCT 30.3*  MCV 88.6  PLT 392   Basic Metabolic Panel Recent Labs    87/74/74 0243 06/18/24 0222  NA 136 136  K 3.7 3.7  CL 97* 97*  CO2 23 19*  GLUCOSE 139* 112*  BUN 55* 56*   CREATININE 3.66* 4.07*  CALCIUM  9.5 9.8  MG 2.1  --    Liver Function Tests No results for input(s): AST, ALT, ALKPHOS, BILITOT, PROT, ALBUMIN  in the last 72 hours.  BNP (last 3 results) Recent Labs    03/24/24 1624 04/17/24 1704  BNP 1,093.8* 1,324.0*   ProBNP (last 3 results) Recent Labs    03/12/24 0942 06/14/24 1241  PROBNP 5,364.0* 8,130.0*   Medications:   Scheduled Medications:  allopurinol   50 mg Oral Daily   apixaban   5 mg Oral BID   buPROPion   300 mg Oral Daily   colchicine   0.6 mg Oral BID   docusate sodium   100 mg Oral BID   famotidine   20 mg Oral Daily   midodrine   15 mg Oral TID WC   pantoprazole   40 mg Oral Daily   rosuvastatin   20 mg Oral Daily   senna  1 tablet Oral BID   sodium chloride  flush  3 mL Intravenous Q12H    Infusions:  sodium chloride      milrinone  0.125 mcg/kg/min (06/17/24 1230)    PRN Medications: sodium chloride , acetaminophen  **OR** acetaminophen , ondansetron  **OR** ondansetron  (ZOFRAN ) IV, oxybutynin , oxyCODONE , sodium chloride  flush  Assessment/Plan   1. End stage combined acute on chronic systolic and diastolic heart failurer: Primarily restrictive cardiomyopathy with biventricular involvement. Severely reduced VO2, TD index. Workup  included CMR, biopsy without evidence of cardiac amyloid. Biopsy with hypertrophic nuclei, genetic testing ordered previously. - Echo 10/25 EF 40% RV moderately down  - [facility] referral for txp workup has been sent - NYHA IV on admission.  - Appears volume overloaded but not responding to milrinone  and IV lasix .  - On midodrine  for BP support - Will stop lasix . Plan RHC tomorrow.  - Suspect we may have to move to stop midodrine  and move to ICU for NE support post RHC   Chronic AF - Rate controlled - Duration unknown. Was in AF on all ECGs dating back to 10/24 (was in sinus in 10/24) - Continue Eliquis  5 mg BID   Acute on CKD stage IV - SCr 3 - Scheduled at [facility] in January for  transplant referral - Plan as above   Pleural effusion   - s/p tap this admit  6. Gout - pain in hands - uric acid 17.6 - colchicine  per primary - on prednisone  burst, 20 mg x3days  Length of Stay: 4  Jordan Lee, NP  06/18/2024, 6:58 AM  Advanced Heart Failure Team Pager [phone] (M-F; 7a - 5p)   Please visit [email or web address]: For overnight coverage please call cardiology fellow first. If fellow not available call Shock/ECMO MD on call.  For ECMO / Mechanical Support (Impella, IABP, LVAD) issues call Shock / ECMO MD on call.   Agree with above.   Continues to deteriorate despite milrinone  support  General:  Sitting up in bed. No resp difficulty HEENT: normal Neck: supple. JVP to ear  Cor: Irregular rate & rhythm. Lungs: clear Abdomen: soft, nontender, nondistended.Good bowel sounds. Extremities: no cyanosis, clubbing, rash,2-3+  edema Neuro: alert & orientedx3, cranial nerves grossly intact. moves all 4 extremities w/o difficulty. Affect pleasant  He is getting progressively worse. Will take for RHC/swan placement and likely move to ICU. May need CVVHD.   CRITICAL CARE Performed by: Cherrie Sieving  Total critical care time: 35 minutes  Critical care time was exclusive of separately billable procedures and treating other patients.  Critical care was necessary to treat or prevent imminent or life-threatening deterioration.  Critical care was time spent personally by me (independent of midlevel providers or residents) on the following activities: development of treatment plan with patient and/or surrogate as well as nursing, discussions with consultants, evaluation of patient's response to treatment, examination of patient, obtaining history from patient or surrogate, ordering and performing treatments and interventions, ordering and review of laboratory studies, ordering and review of radiographic studies, pulse oximetry and re-evaluation of patient's condition.  Sieving Cherrie, MD  12:23 PM    "

## 2024-06-18 NOTE — Consult Note (Signed)
 "  NAME:  Keith Leblanc, MRN:  [id number], DOB:  1965/12/02, LOS: 4 ADMISSION DATE:  06/14/2024, CONSULTATION DATE:  06/18/24 REFERRING MD:  Noralee HERO, CHIEF COMPLAINT:  dyspnea x2 weeks  History of Present Illness:  Keith Leblanc is a 58 yo male with past medical history significant for end-stage diastolic heart failure, Esophagitis/gastritis, former ETOH abuse, DM2, HTN, CKD IV, prior CVA, PAF on Eliquis  who presented 12/22 with 2-weeks dyspnea, worsening heart failure. CXR showed right pleural effusion and patient underwent R thoracentesis with 1.3L removed. Heart failure following and started on milrinone /lasix  with minimal response, planned for RHC today. PCCM consulted for ICU admission post-cath.  Patient recently hospitalized here at [facility] 04/17/24-05/03/2024 for acute on chronic decompensated heart failure s/p RHC with elevated filling pressures, reduced index, LHC deferred d/t renal function, and was treated with inotropes and diuretics.  Pertinent Medical History:   Past Medical History:  Diagnosis Date   Blood in stool    bright red blood    Chicken pox    Diabetes mellitus (HCC)    Erectile dysfunction    Gout    Hypertension    Stroke (HCC)    Significant Hospital Events: Including procedures, antibiotic start and stop dates in addition to other pertinent events   12/22 Admit TRH worsening heart failure; started on milrinone /lasix ; CXR right pleural effusion s/p thoracentesis with 1.3L removed 12/23-12/25 minimal response to inotropes/lasix >worsening renal function-lasix  gtt stopped 12/26: ICU post-RHC  Interim History / Subjective:  PCCM consulted for ICU admission post-cath  Objective    Blood pressure 105/83, pulse 81, temperature 97.8 F (36.6 C), temperature source Oral, resp. rate 20, height 6' 5 (1.956 m), weight 103.1 kg, SpO2 95%.        Intake/Output Summary (Last 24 hours) at 06/18/2024 1359 Last data filed at 06/17/2024 2100 Gross per 24 hour   Intake 360 ml  Output --  Net 360 ml   Filed Weights   06/16/24 0421 06/17/24 0306 06/18/24 0604  Weight: 106 kg 104.4 kg 103.1 kg    Examination: General: acute on chronically-ill middle aged male lying in bed post-RHC, in NAD HEENT: AT/[place], PERRL, persistent floaters in left eye, wears glasses Pulm: normal inspiratory and expiratory effort on RA CV: irreg/irreg, no m/g/r GI: soft, non distended, non tender to palpation Neuro: A&O x3, no focal deficits   Resolved Problem List:   Assessment and Plan:   End stage combined acute on chronic systolic and diastolic heart failure Restrictive cardiomyopathy w/ biventricular involvement Cardiorenal syndrome Hypotension -HF following; sent [facility] referral for heart/kidney txp workup; appt scheduled -TTE 10/25 EF 40% reduced RV; endomyocardial bx negative for cardiac amyloidosis -Initially started on milrinone /lasix  with minimal response; lasix  gtt stopped today with worsening renal function -s/p RHC 12/26  RA = 19; RV = 49/19; PA = 51/29 (36); PCW = 28; Fick cardiac output/index = 6.2/2.6 -Continue Inotropes per HF -Nephrology consulted; Starting CRRT -Cont home midodrine  -GDMT limited d/t hypotension/renal function  Anemia likely multifactorial 2/2 hemodilution d/t ADHF with prior GI blood loss -Hgb 9.5 (10.7 in 04/2024) -Continue to monitor cbc -Monitor for signs of active bleeding  AKI on CKD IV-likely 2/2 cardiorenal syndrome -sCr baseline 1.5-2.5; 3 on admission>now 4.07 -Transplant referral at [facility] scheduled for January -Continue to monitor renal indices -Monitor uop -Monitor and replace electrolytes as indicated  Chronic AF -Rate controlled -Continue home Eliquis   Right pleural effusion -s/p thoracentesis with 1.3L removed on 12/22  Gout-right hand -Uric acid 17.6 -Started on colchicine ;  prednisone  burst x3 days  Hx gastritis/esophagitis Diverticulosis (dx 2019) -EGD 10/27 for ABLA; friable gastric  mucosa -Colonoscopy d/t hematochezia 10/30-internal hemorrhoids -Cont PPI  Persistent left eye floaters since prior hospitalization -Scheduled for eye assessment in Jan 2026 -A1c 5.4 -3 months ago; rpt pending  Labs:  CBC: Recent Labs  Lab 06/14/24 1241 06/15/24 0237 06/16/24 0138  WBC 5.4 5.2 5.0  NEUTROABS 2.9  --   --   HGB 10.1* 9.5* 9.5*  HCT 33.3* 30.4* 30.3*  MCV 93.0 90.7 88.6  PLT 433* 378 392    Basic Metabolic Panel: Recent Labs  Lab 06/14/24 1241 06/15/24 0237 06/16/24 0138 06/17/24 0243 06/18/24 0222  NA 139 140 137 136 136  K 4.4 3.7 4.1 3.7 3.7  CL 100 101 99 97* 97*  CO2 28 26 24 23  19*  GLUCOSE 78 76 133* 139* 112*  BUN 50* 53* 50* 55* 56*  CREATININE 3.06* 3.00* 3.17* 3.66* 4.07*  CALCIUM  9.9 9.5 9.6 9.5 9.8  MG  --  2.1  --  2.1  --   PHOS  --  4.2  --   --   --    GFR: Estimated Creatinine Clearance: 25.5 mL/min (A) (by C-G formula based on SCr of 4.07 mg/dL (H)). Recent Labs  Lab 06/14/24 1241 06/15/24 0237 06/16/24 0138  WBC 5.4 5.2 5.0    Liver Function Tests: Recent Labs  Lab 06/14/24 1241 06/15/24 0237  AST 33 27  ALT 40 23  ALKPHOS 152* 135*  BILITOT 0.9 0.8  PROT 7.4 6.5  ALBUMIN  4.1 3.5   No results for input(s): LIPASE, AMYLASE in the last 168 hours. No results for input(s): AMMONIA in the last 168 hours.  ABG    Component Value Date/Time   PHART 7.255 (L) 07/07/2016 1235   PCO2ART 26.9 (L) 07/07/2016 1235   PO2ART 89.4 07/07/2016 1235   HCO3 19.1 (L) 04/23/2024 1059   TCO2 20 (L) 04/23/2024 1059   ACIDBASEDEF 6.0 (H) 04/23/2024 1059   O2SAT 67.5 05/03/2024 0955     Coagulation Profile: No results for input(s): INR, PROTIME in the last 168 hours.  Cardiac Enzymes: No results for input(s): CKTOTAL, CKMB, CKMBINDEX, TROPONINI in the last 168 hours.  HbA1C: Hemoglobin A1C  Date/Time Value Ref Range Status  10/23/2023 09:49 AM 4.7 4.0 - 5.6 % Final   Hgb A1c MFr Bld  Date/Time Value  Ref Range Status  03/12/2024 05:04 PM 5.4 4.8 - 5.6 % Final    Comment:    (NOTE) Diagnosis of Diabetes The following HbA1c ranges recommended by the American Diabetes Association (ADA) may be used as an aid in the diagnosis of diabetes mellitus.  Hemoglobin             Suggested A1C NGSP%              Diagnosis  <5.7                   Non Diabetic  5.7-6.4                Pre-Diabetic  >6.4                   Diabetic  <7.0                   Glycemic control for                       adults with diabetes.  11/09/2022 12:45 AM 5.6 4.8 - 5.6 % Final    Comment:    (NOTE) Pre diabetes:          5.7%-6.4%  Diabetes:              >6.4%  Glycemic control for   <7.0% adults with diabetes    CBG: No results for input(s): GLUCAP in the last 168 hours.  Review of Systems:   Review of systems completed with pertinent positives/negatives outlined in above HPI. Past Medical History:  He,  has a past medical history of Blood in stool, Chicken pox, Diabetes mellitus (HCC), Erectile dysfunction, Gout, Hypertension, and Stroke (HCC).   Surgical History:   Past Surgical History:  Procedure Laterality Date   APPENDECTOMY  2004   COLONOSCOPY     COLONOSCOPY N/A 04/22/2024   Procedure: COLONOSCOPY;  Surgeon: Stacia Glendia BRAVO, MD;  Location: [facility] ENDOSCOPY;  Service: Gastroenterology;  Laterality: N/A;   ENDOMYOCARDIAL BIOPSY N/A 03/16/2024   Procedure: ENDOMYOCARDIAL BIOPSY;  Surgeon: Zenaida Morene PARAS, MD;  Location: [facility] INVASIVE CV LAB;  Service: Cardiovascular;  Laterality: N/A;   ESOPHAGOGASTRODUODENOSCOPY N/A 04/19/2024   Procedure: EGD (ESOPHAGOGASTRODUODENOSCOPY);  Surgeon: Stacia Glendia BRAVO, MD;  Location: [facility] ENDOSCOPY;  Service: Gastroenterology;  Laterality: N/A;   IR THORACENTESIS ASP PLEURAL SPACE W/IMG GUIDE  06/14/2024   RIGHT HEART CATH N/A 03/16/2024   Procedure: RIGHT HEART CATH;  Surgeon: Zenaida Morene PARAS, MD;  Location: [facility] INVASIVE CV LAB;  Service:  Cardiovascular;  Laterality: N/A;   RIGHT HEART CATH N/A 04/23/2024   Procedure: RIGHT HEART CATH;  Surgeon: Rolan Ezra RAMAN, MD;  Location: [facility] INVASIVE CV LAB;  Service: Cardiovascular;  Laterality: N/A;   TOOTH EXTRACTION  07/06/2020     Social History:   reports that he quit smoking about 3 months ago. His smoking use included cigarettes and cigars. He has never used smokeless tobacco. He reports that he does not currently use alcohol. He reports that he does not currently use drugs after having used the following drugs: Marijuana.   Family History:  His family history includes Alcohol abuse in his father; Breast cancer in his mother; Gout in his father and mother; Healthy in his daughter and sister; Heart disease in his father; Hypertension in his father and mother. There is no history of Colon cancer, Esophageal cancer, Rectal cancer, or Stomach cancer.   Allergies Allergies[1]   Home Medications  Prior to Admission medications  Medication Sig Start Date End Date Taking? Authorizing Provider  buPROPion  (WELLBUTRIN  XL) 300 MG 24 hr tablet TAKE 1 TABLET BY MOUTH ONCE  DAILY 04/13/24  Yes Nafziger, Darleene, NP  colchicine  0.6 MG tablet TAKE 1 TABLET BY MOUTH DAILY  WHEN HAVING FLARES TAKE 1 TABLET BY MOUTH TWICE DAILY 09/05/23  Yes Nafziger, Darleene, NP  ELIQUIS  5 MG TABS tablet TAKE 1 TABLET BY MOUTH TWICE  DAILY 04/27/24  Yes West, Katlyn D, NP  hydrOXYzine  (VISTARIL ) 50 MG capsule Take 1 capsule (50 mg total) by mouth every 8 (eight) hours as needed. 03/23/24  Yes Nafziger, Darleene, NP  midodrine  (PROAMATINE ) 5 MG tablet Take 3 tablets (15 mg total) by mouth 3 (three) times daily with meals. 05/03/24  Yes Tobie Yetta HERO, MD  Multiple Vitamins-Minerals (ONE-A-DAY MENS 50+) TABS Take 1 tablet by mouth daily with breakfast.   Yes [provider]  oxybutynin  (DITROPAN ) 5 MG tablet Take 1 tablet (5 mg total) by mouth every 8 (eight) hours as needed for bladder spasms. 05/18/24  Yes Rolan Ezra RAMAN, MD  pantoprazole  (PROTONIX ) 40 MG tablet Take 1 tablet (40 mg total) by mouth daily. 05/18/24  Yes Rolan Ezra RAMAN, MD  potassium chloride  SA (KLOR-CON  M) 20 MEQ tablet Take 2 tablets (40 mEq total) by mouth 2 (two) times daily. 05/18/24  Yes Rolan Ezra RAMAN, MD  torsemide  (DEMADEX ) 20 MG tablet Take 3 tablets (60 mg total) by mouth 2 (two) times daily. 05/03/24  Yes Tobie Yetta HERO, MD  triamcinolone  cream (KENALOG ) 0.5 % Apply 1 Application topically as needed (Irritation).   Yes [provider]     Critical care time: 40 minutes    Idamay Hosein, DNP, AGACNP-BC [facility]  Please see [email or web address] for pager details.  From 7A-7P if no response, please call [phone]. After hours, please call ELink [phone].     [1] No Known Allergies  "

## 2024-06-18 NOTE — Consult Note (Addendum)
 Reason for Consult: Renal failure Referring Physician: Dr. Cherrie  Chief Complaint: Shortness of breath  Assessment/Plan: # Acute kidney injury on CKD 4 likely from cardiorenal syndrome: The patient had Right Heart CHF team and failing treatment with milrinone  plus high-dose diuretics.  Will initiate CRRT with net UF 50-150 cc/min as tolerated. 4K baths will follow for any need for adjustment  Right IJ temp cath placed by AHF team   # Acute on chronic CHF with EF of 45 to 50%.  Failed treatment with inotropes and diuretics.  [facility] heart transplant evaluation not till Jan 2026. - See above; CRRT  # Hypotension on midodrine  15mg  TID.  Monitor BP.   # Hypokalemia: Replete potassium chloride .   # Anemia: Hemoglobin 10.9  # Gout: prednisone  burst   HPI: Keith Leblanc is an 58 y.o. male with past medical history significant for hypertension, dyslipidemia, type 2 diabetes, gout, paroxysmal atrial fibrillation on Eliquis , systolic and end stage diastolic heart failure with restrictive cardiomyopathy (biopsy negative for cardiac amyloid), anemia, h/o CVA, former drinker and had been referred to [facility] for evaluation for a transplant.  Dr. Cherrie had reached out to [facility] heart transplant prior admission and they were interested in evaluating the patient. Patient now presenting  with shortness of breath, dyspnea on exertion s/p thoracentesis on 12/22 with 1.3 L output.  Unfortunately serum creatinine rising with poor urine output even with milrinone  and IV Lasix   160mg  IV BID, midodrine  TID.  Patient with a right heart cath on 06/18/2024 PCW 28 RA 19.   No recent NSAID, contrast use recently.  ROS Pertinent items are noted in HPI.  Chemistry and CBC: Creat  Date/Time Value Ref Range Status  10/23/2023 10:05 AM 1.56 (H) 0.70 - 1.30 mg/dL Final  91/95/7978 90:93 AM 1.37 (H) 0.70 - 1.33 mg/dL Final    Comment:    For patients >51 years of age, the reference limit for Creatinine  is approximately 13% higher for people identified as African-American. .    Creatinine, Ser  Date/Time Value Ref Range Status  06/18/2024 02:22 AM 4.07 (H) 0.61 - 1.24 mg/dL Final  87/74/7974 97:56 AM 3.66 (H) 0.61 - 1.24 mg/dL Final  87/75/7974 98:61 AM 3.17 (H) 0.61 - 1.24 mg/dL Final  87/76/7974 97:62 AM 3.00 (H) 0.61 - 1.24 mg/dL Final  87/77/7974 87:58 PM 3.06 (H) 0.61 - 1.24 mg/dL Final  88/86/7974 89:50 AM 4.39 (H) 0.40 - 1.50 mg/dL Final  88/89/7974 94:74 AM 4.39 (H) 0.61 - 1.24 mg/dL Final  88/90/7974 95:42 AM 4.58 (H) 0.61 - 1.24 mg/dL Final  88/91/7974 95:59 AM 4.69 (H) 0.61 - 1.24 mg/dL Final  88/92/7974 95:74 AM 4.74 (H) 0.61 - 1.24 mg/dL Final  88/93/7974 95:77 AM 4.78 (H) 0.61 - 1.24 mg/dL Final  88/94/7974 93:71 AM 4.72 (H) 0.61 - 1.24 mg/dL Final  88/95/7974 94:64 AM 4.97 (H) 0.61 - 1.24 mg/dL Final  88/96/7974 95:49 PM 4.88 (H) 0.61 - 1.24 mg/dL Final  88/96/7974 94:99 AM 4.82 (H) 0.61 - 1.24 mg/dL Final  88/97/7974 94:52 AM 5.17 (H) 0.61 - 1.24 mg/dL Final  88/98/7974 94:99 AM 5.08 (H) 0.61 - 1.24 mg/dL Final  89/68/7974 95:54 AM 4.82 (H) 0.61 - 1.24 mg/dL Final  89/69/7974 95:95 AM 4.49 (H) 0.61 - 1.24 mg/dL Final  89/70/7974 96:80 AM 4.25 (H) 0.61 - 1.24 mg/dL Final  89/71/7974 95:64 AM 4.00 (H) 0.61 - 1.24 mg/dL Final  89/72/7974 95:52 AM 3.97 (H) 0.61 - 1.24 mg/dL Final  89/74/7974 94:95  PM 3.13 (H) 0.61 - 1.24 mg/dL Final  89/98/7974 95:75 PM 2.47 (H) 0.61 - 1.24 mg/dL Final  90/69/7974 88:61 AM 2.42 (H) 0.40 - 1.50 mg/dL Final  90/74/7974 96:42 AM 2.88 (H) 0.61 - 1.24 mg/dL Final  90/75/7974 94:43 AM 3.05 (H) 0.61 - 1.24 mg/dL Final  90/76/7974 95:63 AM 2.79 (H) 0.61 - 1.24 mg/dL Final  90/77/7974 94:53 AM 3.08 (H) 0.61 - 1.24 mg/dL Final  90/78/7974 96:61 AM 3.16 (H) 0.61 - 1.24 mg/dL Final  90/79/7974 95:81 AM 2.63 (H) 0.61 - 1.24 mg/dL Final  90/80/7974 90:57 AM 2.53 (H) 0.61 - 1.24 mg/dL Final  93/73/7974 90:73 AM 1.46 (H) 0.76 - 1.27 mg/dL Final   89/91/7975 91:48 AM 1.60 (H) 0.76 - 1.27 mg/dL Final  93/74/7975 91:57 AM 1.47 0.40 - 1.50 mg/dL Final  94/77/7975 88:99 AM 1.74 (H) 0.40 - 1.50 mg/dL Final  94/80/7975 96:78 AM 1.29 (H) 0.61 - 1.24 mg/dL Final  94/81/7975 96:88 PM 1.37 (H) 0.61 - 1.24 mg/dL Final  94/81/7975 87:54 AM 1.54 (H) 0.61 - 1.24 mg/dL Final  94/82/7975 89:47 PM 1.80 (H) 0.61 - 1.24 mg/dL Final  94/82/7975 89:48 PM 1.66 (H) 0.61 - 1.24 mg/dL Final  96/72/7976 87:41 PM 1.60 (H) 0.40 - 1.50 mg/dL Final  96/77/7976 89:92 AM 1.75 (H) 0.40 - 1.50 mg/dL Final  96/97/7977 90:65 AM 1.41 0.40 - 1.50 mg/dL Final  98/79/7977 97:50 PM 1.35 (H) 0.76 - 1.27 mg/dL Final  90/98/7978 88:53 AM 1.27 0.76 - 1.27 mg/dL Final  87/96/7979 96:86 PM 1.25 0.40 - 1.50 mg/dL Final  87/73/7980 97:82 PM 1.32 (H) 0.61 - 1.24 mg/dL Final  89/88/7980 91:71 AM 1.34 0.40 - 1.50 mg/dL Final  88/84/7981 89:99 AM 1.17 0.40 - 1.50 mg/dL Final  98/76/7981 97:50 PM 1.11 0.40 - 1.50 mg/dL Final  98/83/7981 93:64 AM 1.18 0.61 - 1.24 mg/dL Final   Recent Labs  Lab 06/14/24 1241 06/15/24 0237 06/16/24 0138 06/17/24 0243 06/18/24 0222 06/18/24 1302  NA 139 140 137 136 136 138  139  K 4.4 3.7 4.1 3.7 3.7 3.8  3.8  CL 100 101 99 97* 97*  --   CO2 28 26 24 23  19*  --   GLUCOSE 78 76 133* 139* 112*  --   BUN 50* 53* 50* 55* 56*  --   CREATININE 3.06* 3.00* 3.17* 3.66* 4.07*  --   CALCIUM  9.9 9.5 9.6 9.5 9.8  --   PHOS  --  4.2  --   --   --   --    Recent Labs  Lab 06/14/24 1241 06/15/24 0237 06/16/24 0138 06/18/24 1302  WBC 5.4 5.2 5.0  --   NEUTROABS 2.9  --   --   --   HGB 10.1* 9.5* 9.5* 10.9*  10.9*  HCT 33.3* 30.4* 30.3* 32.0*  32.0*  MCV 93.0 90.7 88.6  --   PLT 433* 378 392  --    Liver Function Tests: Recent Labs  Lab 06/14/24 1241 06/15/24 0237  AST 33 27  ALT 40 23  ALKPHOS 152* 135*  BILITOT 0.9 0.8  PROT 7.4 6.5  ALBUMIN  4.1 3.5   No results for input(s): LIPASE, AMYLASE in the last 168 hours. No results  for input(s): AMMONIA in the last 168 hours. Cardiac Enzymes: No results for input(s): CKTOTAL, CKMB, CKMBINDEX, TROPONINI in the last 168 hours. Iron  Studies: No results for input(s): IRON , TIBC, TRANSFERRIN, FERRITIN in the last 72 hours. PT/INR: @LABRCNTIP (inr:5)  Xrays/Other Studies: ) Results  for orders placed or performed during the hospital encounter of 06/14/24 (from the past 48 hours)  Basic metabolic panel     Status: Abnormal   Collection Time: 06/17/24  2:43 AM  Result Value Ref Range   Sodium 136 135 - 145 mmol/L   Potassium 3.7 3.5 - 5.1 mmol/L   Chloride 97 (L) 98 - 111 mmol/L   CO2 23 22 - 32 mmol/L   Glucose, Bld 139 (H) 70 - 99 mg/dL    Comment: Glucose reference range applies only to samples taken after fasting for at least 8 hours.   BUN 55 (H) 6 - 20 mg/dL   Creatinine, Ser 6.33 (H) 0.61 - 1.24 mg/dL   Calcium  9.5 8.9 - 10.3 mg/dL   GFR, Estimated 19 (L) >60 mL/min    Comment: (NOTE) Calculated using the CKD-EPI Creatinine Equation (2021)    Anion gap 16 (H) 5 - 15    Comment: Performed at [facility], [address], [place], [place] [address]  Magnesium      Status: None   Collection Time: 06/17/24  2:43 AM  Result Value Ref Range   Magnesium  2.1 1.7 - 2.4 mg/dL    Comment: Performed at [facility], [address], [place], [place] [address]  Basic metabolic panel     Status: Abnormal   Collection Time: 06/18/24  2:22 AM  Result Value Ref Range   Sodium 136 135 - 145 mmol/L   Potassium 3.7 3.5 - 5.1 mmol/L   Chloride 97 (L) 98 - 111 mmol/L   CO2 19 (L) 22 - 32 mmol/L   Glucose, Bld 112 (H) 70 - 99 mg/dL    Comment: Glucose reference range applies only to samples taken after fasting for at least 8 hours.   BUN 56 (H) 6 - 20 mg/dL   Creatinine, Ser 5.92 (H) 0.61 - 1.24 mg/dL   Calcium  9.8 8.9 - 10.3 mg/dL   GFR, Estimated 16 (L) >60 mL/min    Comment: (NOTE) Calculated using the CKD-EPI Creatinine Equation (2021)     Anion gap 20 (H) 5 - 15    Comment: Performed at [facility], [address] [place], [place] [address]  POCT I-Stat EG7     Status: Abnormal   Collection Time: 06/18/24  1:02 PM  Result Value Ref Range   pH, Ven 7.396 7.25 - 7.43   pCO2, Ven 39.1 (L) 44 - 60 mmHg   pO2, Ven 30 (LL) 32 - 45 mmHg   Bicarbonate 24.0 20.0 - 28.0 mmol/L   TCO2 25 22 - 32 mmol/L   O2 Saturation 57 %   Acid-base deficit 1.0 0.0 - 2.0 mmol/L   Sodium 139 135 - 145 mmol/L   Potassium 3.8 3.5 - 5.1 mmol/L   Calcium , Ion 1.17 1.15 - 1.40 mmol/L   HCT 32.0 (L) 39.0 - 52.0 %   Hemoglobin 10.9 (L) 13.0 - 17.0 g/dL   Sample type VENOUS    Comment NOTIFIED PHYSICIAN   POCT I-Stat EG7     Status: Abnormal   Collection Time: 06/18/24  1:02 PM  Result Value Ref Range   pH, Ven 7.384 7.25 - 7.43   pCO2, Ven 39.7 (L) 44 - 60 mmHg   pO2, Ven 30 (LL) 32 - 45 mmHg   Bicarbonate 23.7 20.0 - 28.0 mmol/L   TCO2 25 22 - 32 mmol/L   O2 Saturation 56 %   Acid-base deficit 1.0 0.0 - 2.0  mmol/L   Sodium 138 135 - 145 mmol/L   Potassium 3.8 3.5 - 5.1 mmol/L   Calcium , Ion 1.20 1.15 - 1.40 mmol/L   HCT 32.0 (L) 39.0 - 52.0 %   Hemoglobin 10.9 (L) 13.0 - 17.0 g/dL   Sample type VENOUS    Comment NOTIFIED PHYSICIAN   Glucose, capillary     Status: Abnormal   Collection Time: 06/18/24  3:28 PM  Result Value Ref Range   Glucose-Capillary 145 (H) 70 - 99 mg/dL    Comment: Glucose reference range applies only to samples taken after fasting for at least 8 hours.   CARDIAC CATHETERIZATION Result Date: 06/18/2024 Findings: On milrinone  0.125 mcg/kg/min RA = 19 RV = 49/19 PA = 51/29 (36) PCW = 28 Fick cardiac output/index = 6.2/2.6 Thermo CO/CI 3.7/1.6 PVR = 1.1 (Fick) 1.9 (TD) Ao sat = 95% PA sat = 56%, 57% PAPi = 1.1 Assessment: 1. Markedly elevated filling pressures with low cardiac output by TD 2. Progressive cardiorenal syndrome Plan/Discussion: Move to ICU. Continue inotrope support. Start CRRT. Will need to consider  transplant options. Toribio Fuel, MD 1:45 PM  PERIPHERAL VASCULAR CATHETERIZATION Result Date: 06/18/2024 Findings: On milrinone  0.125 mcg/kg/min RA = 19 RV = 49/19 PA = 51/29 (36) PCW = 28 Fick cardiac output/index = 6.2/2.6 Thermo CO/CI 3.7/1.6 PVR = 1.1 (Fick) 1.9 (TD) Ao sat = 95% PA sat = 56%, 57% PAPi = 1.1 Assessment: 1. Markedly elevated filling pressures with low cardiac output by TD 2. Progressive cardiorenal syndrome Plan/Discussion: Move to ICU. Continue inotrope support. Start CRRT. Will need to consider transplant options. Toribio Fuel, MD 1:45 PM   PMH:   Past Medical History:  Diagnosis Date   Blood in stool    bright red blood    Chicken pox    Diabetes mellitus (HCC)    Erectile dysfunction    Gout    Hypertension    Stroke [facility])     PSH:   Past Surgical History:  Procedure Laterality Date   APPENDECTOMY  2004   COLONOSCOPY     COLONOSCOPY N/A 04/22/2024   Procedure: COLONOSCOPY;  Surgeon: Stacia Glendia BRAVO, MD;  Location: [facility] ENDOSCOPY;  Service: Gastroenterology;  Laterality: N/A;   ENDOMYOCARDIAL BIOPSY N/A 03/16/2024   Procedure: ENDOMYOCARDIAL BIOPSY;  Surgeon: Zenaida Morene PARAS, MD;  Location[facility] INVASIVE CV LAB;  Service: Cardiovascular;  Laterality: N/A;   ESOPHAGOGASTRODUODENOSCOPY N/A 04/19/2024   Procedure: EGD (ESOPHAGOGASTRODUODENOSCOPY);  Surgeon: Stacia Glendia BRAVO, MD;  Location: [facility] ENDOSCOPY;  Service: Gastroenterology;  Laterality: N/A;   IR THORACENTESIS ASP PLEURAL SPACE W/IMG GUIDE  06/14/2024   RIGHT HEART CATH N/A 03/16/2024   Procedure: RIGHT HEART CATH;  Surgeon: Zenaida Morene PARAS, MD;  Location: [facility] INVASIVE CV LAB;  Service: Cardiovascular;  Laterality: N/A;   RIGHT HEART CATH N/A 04/23/2024   Procedure: RIGHT HEART CATH;  Surgeon: Rolan Ezra RAMAN, MD;  Location: [facility] INVASIVE CV LAB;  Service: Cardiovascular;  Laterality: N/A;   TOOTH EXTRACTION  07/06/2020    Allergies: Allergies[1]  Medications:   Prior to Admission  medications  Medication Sig Start Date End Date Taking? Authorizing Provider  buPROPion  (WELLBUTRIN  XL) 300 MG 24 hr tablet TAKE 1 TABLET BY MOUTH ONCE  DAILY 04/13/24  Yes Nafziger, Darleene, NP  colchicine  0.6 MG tablet TAKE 1 TABLET BY MOUTH DAILY  WHEN HAVING FLARES TAKE 1 TABLET BY MOUTH TWICE DAILY 09/05/23  Yes Nafziger, Darleene, NP  ELIQUIS  5 MG TABS tablet TAKE 1 TABLET BY  MOUTH TWICE  DAILY 04/27/24  Yes West, Katlyn D, NP  hydrOXYzine  (VISTARIL ) 50 MG capsule Take 1 capsule (50 mg total) by mouth every 8 (eight) hours as needed. 03/23/24  Yes Nafziger, Darleene, NP  midodrine  (PROAMATINE ) 5 MG tablet Take 3 tablets (15 mg total) by mouth 3 (three) times daily with meals. 05/03/24  Yes Tobie Yetta HERO, MD  Multiple Vitamins-Minerals (ONE-A-DAY MENS 50+) TABS Take 1 tablet by mouth daily with breakfast.   Yes [provider]  oxybutynin  (DITROPAN ) 5 MG tablet Take 1 tablet (5 mg total) by mouth every 8 (eight) hours as needed for bladder spasms. 05/18/24  Yes Rolan Ezra RAMAN, MD  pantoprazole  (PROTONIX ) 40 MG tablet Take 1 tablet (40 mg total) by mouth daily. 05/18/24  Yes Rolan Ezra RAMAN, MD  potassium chloride  SA (KLOR-CON  M) 20 MEQ tablet Take 2 tablets (40 mEq total) by mouth 2 (two) times daily. 05/18/24  Yes Rolan Ezra RAMAN, MD  torsemide  (DEMADEX ) 20 MG tablet Take 3 tablets (60 mg total) by mouth 2 (two) times daily. 05/03/24  Yes Tobie Yetta HERO, MD  triamcinolone  cream (KENALOG ) 0.5 % Apply 1 Application topically as needed (Irritation).   Yes [provider]    Discontinued Meds:   Medications Discontinued During This Encounter  Medication Reason   Insulin  Pen Needle (B-D UF III MINI PEN NEEDLES) 31G X 5 MM MISC    Lancets (ONETOUCH ULTRASOFT) lancets    glucose blood (ONETOUCH ULTRA TEST) test strip    blood glucose meter kit and supplies    famotidine  (PEPCID ) 20 MG tablet Patient Preference   magnesium  oxide (MAG-OX) 400 (240 Mg) MG tablet Patient Preference    rosuvastatin  (CRESTOR ) 20 MG tablet Patient Preference   thiamine  (VITAMIN B-1) 100 MG tablet Patient Preference   furosemide  (LASIX ) 120 mg in dextrose  5 % 50 mL IVPB    furosemide  (LASIX ) injection 120 mg    furosemide  (LASIX ) 120 mg in dextrose  5 % 50 mL IVPB    furosemide  (LASIX ) 120 mg in dextrose  5 % 50 mL IVPB    predniSONE  (DELTASONE ) tablet 20 mg    furosemide  (LASIX ) 160 mg in dextrose  5 % 50 mL IVPB    sodium chloride  flush (NS) 0.9 % injection 3 mL    sodium chloride  flush (NS) 0.9 % injection 3 mL    rosuvastatin  (CRESTOR ) tablet 20 mg    colchicine  tablet 0.6 mg    sodium chloride  flush (NS) 0.9 % injection 3 mL Patient Transfer   sodium chloride  flush (NS) 0.9 % injection 3 mL Patient Transfer   0.9 %  sodium chloride  infusion Patient Transfer   Heparin  (Porcine) in NaCl 1000-0.9 UT/500ML-% SOLN Patient Transfer   fentaNYL  (SUBLIMAZE ) injection Patient Transfer   midazolam  PF (VERSED ) injection Patient Transfer   lidocaine  (PF) (XYLOCAINE ) 1 % injection Patient Transfer    Social History:  reports that he quit smoking about 3 months ago. His smoking use included cigarettes and cigars. He has never used smokeless tobacco. He reports that he does not currently use alcohol. He reports that he does not currently use drugs after having used the following drugs: Marijuana.  Family History:   Family History  Problem Relation Age of Onset   Alcohol abuse Father    Hypertension Father    Heart disease Father    Gout Father    Breast cancer Mother    Hypertension Mother    Gout Mother    Healthy Sister  Healthy Daughter    Colon cancer Neg Hx    Esophageal cancer Neg Hx    Rectal cancer Neg Hx    Stomach cancer Neg Hx     Blood pressure 113/77, pulse 81, temperature 97.8 F (36.6 C), temperature source Oral, resp. rate 13, height 6' 5 (1.956 m), weight 103.1 kg, SpO2 97%. General exam: Appears calm and comfortable  Respiratory system: CTA b/l, no  rales Cardiovascular system: S1 & S2 heard, RRR.   Gastrointestinal system: Abdomen is nondistended, soft and nontender. Normal bowel sounds heard. Central nervous system: Alert and oriented. No focal neurological deficits. Extremities: LE edema present tr-1+ Skin: No rashes, lesions or ulcers       Keith Leblanc, LYNWOOD ORN, MD 06/18/2024, 4:07 PM      [1] No Known Allergies

## 2024-06-18 NOTE — Progress Notes (Addendum)
 " Progress Note   Patient: Keith Leblanc FMW:[id number] DOB: 1965-11-14 DOA: 06/14/2024     4 DOS: the patient was seen and examined on 06/18/2024   Brief hospital course: Keith Leblanc was admitted to the hospital with the working diagnosis of heart failure   58/M with end-stage diastolic heart failure, former heavy alcoholic, type II DM, CKD 4, history of CVA, paroxysmal A-fib who presented with worsening dyspnea.  Patient reported worsening symptoms for the last 2 weeks prior to admission, positive orthopnea, PND and lower extremity edema. On the day of admission his symptoms were severe prompting him to come to the ED.  Recent hospitalization 10/25 to 05/03/24 for heart failure with low output state. He was discharged on midodrine  and oral torsemide . Referral for [facility] heart and renal transplant.   On his initial physical examination his blood pressure was 121/88, HR 79, RR 16 and 02 saturation 100% on room air.  Lungs with no wheezing or rhonchi, decreased breath sounds at bases, positive JVD, heart with S1 and S2 present irregularly irregular, with no gallops or rubs, abdomen with no distention and positive lower extremity edema   ED sodium 139, creatinine 3.0, albumin  4.1, proBNP 8130, hemoglobin 10, chest x-ray with moderate right pleural effusion. - IR consulted underwent thoracentesis, 1.3 L of clear fluid drained in the right  EKG 78 bpm, right axis deviation, qtc 492, atrial fibrillation rhythm, with no significant ST segment changes,  negative T wave lead V4 to V6.   Patient placed on high dose of loop diuretic, and high dose midodrine .  12/24 added low dose milrinone  for inotropic support.  12/26 right heart catheterization today   Assessment and Plan: * Acute on chronic systolic CHF (congestive heart failure) (HCC) Echocardiogram with mildly reduced LV systolic function with EF 40 to 45%, global hypokinesis, severe LVH, grade III diastolic dysfunction, (restrictive), RV  systolic function with moderate reduction, mild increased RV wall thickness, LA with mild dilation,   Urine output is documented at 400 ml Systolic blood pressure 100 mmHg range   Inotropic support with milrinone  0.125 mcg/kg/min Blood pressure support with midodrine  15 mg tid  12/24 acetazolamide  500 mg x1   Patient not responding to medical therapy, plan for right heart catheterization today.   Essential hypertension Continue blood pressure monitoring Blood pressure support with midodrine    Paroxysmal A-fib (HCC) Continue telemetry monitoring  Anticoagulation with apixaban .   Chronic kidney disease (CKD), stage IV (severe) (HCC) AKI  Continue worsening renal function with serum cr at 4.07 with K at 3,7 and serum bicarbonate at 19  Na 136, despite aggressive diuresis, with sequential nephron blockade plus inotropic support with milrinone    Plan for right heart catheterization today   Type 2 diabetes mellitus with hyperglycemia (HCC) Continue glucose cover and monitoring with insulin  sliding scale.  Fasting glucose today 112 mg/dl   Gout Continue with colchicine , prednisone  and resume allopurinol .  Uric acid very elevated at 17.6   Tobacco abuse Smoking cessation counseling    Subjective: Patient with no chest pain or dyspnea, no nausea or vomiting, deconditioned   Physical Exam: Vitals:   06/17/24 2314 06/18/24 0349 06/18/24 0604 06/18/24 0728  BP: 103/69 106/71  112/77  Pulse:  79  76  Resp:  18  17  Temp: 98 F (36.7 C) 98.2 F (36.8 C)  97.8 F (36.6 C)  TempSrc: Oral Oral  Oral  SpO2:  94%  98%  Weight:   103.1 kg   Height:  Neurology awake and alert, deconditioned ENT with no pallor  Cardiovascular with S1 and S2 present, irregularly irregular with no gallops or rubs Positive JVD Respiratory with no wheezing or rhonchi positive rales at bases Abdomen with no distention, soft and non tender Trace lower extremity edema   Data  Reviewed:    Family Communication: no family at the bedside   Disposition: Status is: Inpatient Remains inpatient appropriate because: inotropic support   Planned Discharge Destination: Home     Author: Elidia Toribio Furnace, MD 06/18/2024 11:13 AM  For on call review [email or web address].  "

## 2024-06-18 NOTE — Plan of Care (Signed)
  Problem: Clinical Measurements: Goal: Ability to maintain clinical measurements within normal limits will improve Outcome: Progressing Goal: Diagnostic test results will improve Outcome: Progressing Goal: Respiratory complications will improve Outcome: Progressing   Problem: Nutrition: Goal: Adequate nutrition will be maintained Outcome: Progressing

## 2024-06-18 NOTE — Interval H&P Note (Signed)
 History and Physical Interval Note:  06/18/2024 12:40 PM  Kase Shughart  has presented today for surgery, with the diagnosis of hf.  The various methods of treatment have been discussed with the patient and family. After consideration of risks, benefits and other options for treatment, the patient has consented to  Procedures: RIGHT HEART CATH (N/A) as a surgical intervention.  The patient's history has been reviewed, patient examined, no change in status, stable for surgery.  I have reviewed the patient's chart and labs.  Questions were answered to the patient's satisfaction.     Ronie Fleeger

## 2024-06-19 DIAGNOSIS — I5023 Acute on chronic systolic (congestive) heart failure: Secondary | ICD-10-CM | POA: Diagnosis not present

## 2024-06-19 DIAGNOSIS — M109 Gout, unspecified: Secondary | ICD-10-CM

## 2024-06-19 DIAGNOSIS — J90 Pleural effusion, not elsewhere classified: Secondary | ICD-10-CM

## 2024-06-19 DIAGNOSIS — R739 Hyperglycemia, unspecified: Secondary | ICD-10-CM

## 2024-06-19 LAB — RENAL FUNCTION PANEL
Albumin: 3.8 g/dL (ref 3.5–5.0)
Albumin: 3.9 g/dL (ref 3.5–5.0)
Anion gap: 12 (ref 5–15)
Anion gap: 12 (ref 5–15)
BUN: 38 mg/dL — ABNORMAL HIGH (ref 6–20)
BUN: 29 mg/dL — ABNORMAL HIGH (ref 6–20)
CO2: 25 mmol/L (ref 22–32)
CO2: 25 mmol/L (ref 22–32)
Calcium: 9.5 mg/dL (ref 8.9–10.3)
Calcium: 9.8 mg/dL (ref 8.9–10.3)
Chloride: 100 mmol/L (ref 98–111)
Chloride: 100 mmol/L (ref 98–111)
Creatinine, Ser: 2.6 mg/dL — ABNORMAL HIGH (ref 0.61–1.24)
Creatinine, Ser: 2.11 mg/dL — ABNORMAL HIGH (ref 0.61–1.24)
GFR, Estimated: 28 mL/min — ABNORMAL LOW
GFR, Estimated: 36 mL/min — ABNORMAL LOW
Glucose, Bld: 133 mg/dL — ABNORMAL HIGH (ref 70–99)
Glucose, Bld: 132 mg/dL — ABNORMAL HIGH (ref 70–99)
Phosphorus: 2.9 mg/dL (ref 2.5–4.6)
Phosphorus: 2.2 mg/dL — ABNORMAL LOW (ref 2.5–4.6)
Potassium: 3.7 mmol/L (ref 3.5–5.1)
Potassium: 3.6 mmol/L (ref 3.5–5.1)
Sodium: 137 mmol/L (ref 135–145)
Sodium: 137 mmol/L (ref 135–145)

## 2024-06-19 LAB — CBC
HCT: 29.4 % — ABNORMAL LOW (ref 39.0–52.0)
Hemoglobin: 9.1 g/dL — ABNORMAL LOW (ref 13.0–17.0)
MCH: 28.2 pg (ref 26.0–34.0)
MCHC: 31 g/dL (ref 30.0–36.0)
MCV: 91 fL (ref 80.0–100.0)
Platelets: 349 K/uL (ref 150–400)
RBC: 3.23 MIL/uL — ABNORMAL LOW (ref 4.22–5.81)
RDW: 18 % — ABNORMAL HIGH (ref 11.5–15.5)
WBC: 5.8 K/uL (ref 4.0–10.5)
nRBC: 0 % (ref 0.0–0.2)

## 2024-06-19 LAB — COOXEMETRY PANEL
Carboxyhemoglobin: 1.6 % — ABNORMAL HIGH (ref 0.5–1.5)
Methemoglobin: <0.7 % (ref 0.0–1.5)
O2 Saturation: 53.9 %
Total hemoglobin: 9.1 g/dL — ABNORMAL LOW (ref 12.0–16.0)

## 2024-06-19 LAB — GLUCOSE, CAPILLARY
Glucose-Capillary: 118 mg/dL — ABNORMAL HIGH (ref 70–99)
Glucose-Capillary: 141 mg/dL — ABNORMAL HIGH (ref 70–99)
Glucose-Capillary: 130 mg/dL — ABNORMAL HIGH (ref 70–99)
Glucose-Capillary: 132 mg/dL — ABNORMAL HIGH (ref 70–99)

## 2024-06-19 LAB — MAGNESIUM: Magnesium: 2.3 mg/dL (ref 1.7–2.4)

## 2024-06-19 MED ORDER — MELATONIN 3 MG PO TABS
3.0000 mg | ORAL_TABLET | Freq: Every evening | ORAL | Status: DC | PRN
  Administered 2024-06-19 – 2024-06-20 (×2): 3 mg via ORAL
  Filled 2024-06-19 (×2): qty 1

## 2024-06-19 MED ORDER — ADULT MULTIVITAMIN W/MINERALS CH
1.0000 | ORAL_TABLET | Freq: Every day | ORAL | Status: DC
  Administered 2024-06-19 – 2024-06-23 (×5): 1 via ORAL
  Filled 2024-06-19 (×2): qty 1

## 2024-06-19 NOTE — Progress Notes (Addendum)
 "    Advanced Heart Failure Rounding Note  Cardiologist: None  [facility] Cardiologist: Dr. Zenaida Chief Complaint: End stage combined systolic and diastolic HF/ SOB Patient Profile   Keith Leblanc is a 58 y.o. male with end stage diastolic heart failure, former heavy drinker, type 2DM, HTN, CKD IIIa, HLD, history of stroke, and PAF on eliquis . Admitted with SOB s/p thoracentesis.    Significant events:   12/22: Admitted w SOB. S/p thoracentesis 1.3L 12/27: RHC and CRRT started  Subjective:    RHC yesterday with marked volume overload in setting of worsening renal function. Cardiac output ok by Fick Low by thermo  Now on CRRT pulling -100. Weight down 1 pounds  CVP 11   PAP: (39-86)/(11-60) 39/15 CVP:  [3 mmHg-37 mmHg] 12 mmHg PCWP:  [19 mmHg] 19 mmHg CO:  [5.5 L/min] 5.5 L/min CI:  [2.36 L/min/m2] 2.36 L/min/m2  RHC 12/26 On milrinone  0.125 mcg/kg/min   RA = 19 RV = 49/19 PA = 51/29 (36) PCW = 28 Fick cardiac output/index = 6.2/2.6 Thermo CO/CI 3.7/1.6 PVR = 1.1 (Fick) 1.9 (TD) Ao sat = 95% PA sat = 56%, 57% PAPi = 1.1   Objective:    Weight Range: 102.7 kg Body mass index is 26.85 kg/m.   Vital Signs:   Temp:  [97.5 F (36.4 C)-98.4 F (36.9 C)] 98.2 F (36.8 C) (12/27 1600) Pulse Rate:  [72-99] 83 (12/27 1600) Resp:  [9-24] 19 (12/27 1600) BP: (102-143)/(68-87) 104/75 (12/27 1600) SpO2:  [85 %-100 %] 93 % (12/27 1600) Weight:  [102.7 kg] 102.7 kg (12/27 0600) Last BM Date : 06/17/24  Weight change: Filed Weights   06/17/24 0306 06/18/24 0604 06/19/24 0600  Weight: 104.4 kg 103.1 kg 102.7 kg   Intake/Output:  Intake/Output Summary (Last 24 hours) at 06/19/2024 1612 Last data filed at 06/19/2024 1500 Gross per 24 hour  Intake 772.73 ml  Output 2821.2 ml  Net -2048.47 ml    Physical Exam   General:  Sitting up in bed. No resp difficulty HEENT: normal Neck: supple. RIJ HD cath LIJ swan Cor: Irregular rate & rhythm. No rubs, gallops or  murmurs. Lungs: clear Abdomen: soft, nontender, nondistended.Good bowel sounds. Extremities: no cyanosis, clubbing, rash, 1-2+ edema Neuro: alert & orientedx3, cranial nerves grossly intact. moves all 4 extremities w/o difficulty. Affect pleasant  Telemetry   AF 70-80s (personally reviewed)  Labs   CBC Recent Labs    06/18/24 1302 06/19/24 0719  WBC  --  5.8  HGB 10.9*  10.9* 9.1*  HCT 32.0*  32.0* 29.4*  MCV  --  91.0  PLT  --  349   Basic Metabolic Panel Recent Labs    87/74/74 0243 06/18/24 0222 06/18/24 1302 06/19/24 0719  NA 136 136 138  139 137  K 3.7 3.7 3.8  3.8 3.7  CL 97* 97*  --  100  CO2 23 19*  --  25  GLUCOSE 139* 112*  --  133*  BUN 55* 56*  --  38*  CREATININE 3.66* 4.07*  --  2.60*  CALCIUM  9.5 9.8  --  9.5  MG 2.1  --   --  2.3  PHOS  --   --   --  2.9   Liver Function Tests Recent Labs    06/19/24 0719  ALBUMIN  3.8    BNP (last 3 results) Recent Labs    03/24/24 1624 04/17/24 1704  BNP 1,093.8* 1,324.0*   ProBNP (last 3 results) Recent Labs  03/12/24 0942 06/14/24 1241  PROBNP 5,364.0* 8,130.0*   Medications:   Scheduled Medications:  allopurinol   50 mg Oral Daily   apixaban   5 mg Oral BID   buPROPion   300 mg Oral Daily   Chlorhexidine  Gluconate Cloth  6 each Topical Daily   docusate sodium   100 mg Oral BID   famotidine   20 mg Oral Daily   insulin  aspart  1-3 Units Subcutaneous TID AC & HS   multivitamin with minerals  1 tablet Oral Daily   pantoprazole   40 mg Oral Daily   rosuvastatin   10 mg Oral Daily   senna  1 tablet Oral BID   sodium chloride  flush  10-40 mL Intracatheter Q12H    Infusions:  milrinone  0.125 mcg/kg/min (06/19/24 1500)   prismasol  BGK 4/2.5 400 mL/hr at 06/19/24 0606   prismasol  BGK 4/2.5 400 mL/hr at 06/19/24 0606   prismasol  BGK 4/2.5 1,500 mL/hr at 06/19/24 1138    PRN Medications: acetaminophen  **OR** acetaminophen , heparin , ondansetron  **OR** ondansetron  (ZOFRAN ) IV, oxybutynin ,  sodium chloride , sodium chloride  flush  Assessment/Plan   1. End stage combined acute on chronic systolic and diastolic heart failurer: Primarily restrictive cardiomyopathy with biventricular involvement. Severely reduced VO2, TD index. Workup included CMR, biopsy without evidence of cardiac amyloid. Biopsy with hypertrophic nuclei, genetic testing ordered previously. - Echo 10/25 EF 40% RV moderately down  - [facility] referral for txp workup has been sent - NYHA IV on admission.  - Failed to diurese well on milrinone  and IV lasix .  - RHC as above - CRRT started on 12/27. Continue to pull - Hemodynamics remain marginal. Continue milrinone  - Suspect HD need may be permanent and will need to see if he can tolerate - Will need to consider candidacy for heart/kidney transplant. He is O pos - Repeat limited echo   Chronic AF - Rate controlled - Duration unknown. Was in AF on all ECGs dating back to 10/24 (was in sinus in 10/24) - Continue Eliquis    Acute on CKD stage IV - Baseline SCr 3 - CRT started 12/26 - plan s above  Pleural effusion   - s/p tap this admit  6. Gout - pain in hands - uric acid 17.6 - colchicine  per primary - completed prednisone  burst - continue allopurinol  50 daily  CRITICAL CARE Performed by: Niaja Stickley  Total critical care time: 55 minutes  Critical care time was exclusive of separately billable procedures and treating other patients.  Critical care was necessary to treat or prevent imminent or life-threatening deterioration.  Critical care was time spent personally by me (independent of midlevel providers or residents) on the following activities: development of treatment plan with patient and/or surrogate as well as nursing, discussions with consultants, evaluation of patient's response to treatment, examination of patient, obtaining history from patient or surrogate, ordering and performing treatments and interventions, ordering and review of  laboratory studies, ordering and review of radiographic studies, pulse oximetry and re-evaluation of patient's condition.  Length of Stay: 5  Toribio Fuel, MD  06/19/2024, 4:12 PM  Advanced Heart Failure Team Pager [phone] (M-F; 7a - 5p)   Please visit [email or web address]: For overnight coverage please call cardiology fellow first. If fellow not available call Shock/ECMO MD on call.  For ECMO / Mechanical Support (Impella, IABP, LVAD) issues call Shock / ECMO MD on call.      "

## 2024-06-19 NOTE — Progress Notes (Signed)
 eLink Physician-Brief Progress Note Patient Name: Keith Leblanc DOB: 1966-04-02 MRN: [id number]   Date of Service  06/19/2024  HPI/Events of Note  Received request for melatonin  eICU Interventions  Melatonin 3 mg prn ordered for sleep     Intervention Category Minor Interventions: Routine modifications to care plan (e.g. PRN medications for pain, fever)  Damien DASEN Ariyan Sinnett 06/19/2024, 10:49 PM

## 2024-06-19 NOTE — Progress Notes (Signed)
 "  NAME:  Keith Leblanc, MRN:  [id number], DOB:  01-31-1966, LOS: 5 ADMISSION DATE:  06/14/2024, CONSULTATION DATE:  06/18/24 REFERRING MD:  Noralee HERO, CHIEF COMPLAINT:  dyspnea x2 weeks  History of Present Illness:  Keith Leblanc is a 58 yo male with past medical history significant for end-stage diastolic heart failure, Esophagitis/gastritis, former ETOH abuse, DM2, HTN, CKD IV, prior CVA, PAF on Eliquis  who presented 12/22 with 2-weeks dyspnea, worsening heart failure. CXR showed right pleural effusion and patient underwent R thoracentesis with 1.3L removed. Heart failure following and started on milrinone /lasix  with minimal response, planned for RHC today. PCCM consulted for ICU admission post-cath.  Patient recently hospitalized here at [facility] 04/17/24-05/03/2024 for acute on chronic decompensated heart failure s/p RHC with elevated filling pressures, reduced index, LHC deferred d/t renal function, and was treated with inotropes and diuretics.  Pertinent Medical History:   Past Medical History:  Diagnosis Date   Blood in stool    bright red blood    Chicken pox    Diabetes mellitus (HCC)    Erectile dysfunction    Gout    Hypertension    Stroke (HCC)    Significant Hospital Events: Including procedures, antibiotic start and stop dates in addition to other pertinent events   12/22 Admit TRH worsening heart failure; started on milrinone /lasix ; CXR right pleural effusion s/p thoracentesis with 1.3L removed 12/23-12/25 minimal response to inotropes/lasix >worsening renal function-lasix  gtt stopped 12/26: ICU post-RHC  Interim History / Subjective:  Still feels minimally SOB. Appetite is ok.  Objective    Blood pressure 105/68, pulse 73, temperature 98.2 F (36.8 C), resp. rate 18, height 6' 5 (1.956 m), weight 102.7 kg, SpO2 97%. PAP: (36-59)/(11-26) 44/22 CVP:  [3 mmHg-37 mmHg] 9 mmHg PCWP:  [19 mmHg-22 mmHg] 22 mmHg CO:  [4.4 L/min-5.5 L/min] 4.4 L/min CI:  [1.88  L/min/m2-2.36 L/min/m2] 1.88 L/min/m2      Intake/Output Summary (Last 24 hours) at 06/19/2024 1754 Last data filed at 06/19/2024 1700 Gross per 24 hour  Intake 652.8 ml  Output 3066.9 ml  Net -2414.1 ml   Filed Weights   06/17/24 0306 06/18/24 0604 06/19/24 0600  Weight: 104.4 kg 103.1 kg 102.7 kg    Examination: General: chronically ill appearing man sitting up in the chair on CRRT, watching TV HEENT: [place]/AT, eyes anicteric Pulm: breathing comfortably on RA, CTAB, no conversational dyspnea CV: S1S2, reg rate, irreg rhythm GI: soft, NT Neuro: awake, alert, moving all extremities   Labs reviewed.  Coox 54% on milrinone  0.172mcg BUN 29, Cr 2.11 on CRRT  Resolved Problem List:   Assessment and Plan:   End stage combined acute on chronic HFrEF Restrictive cardiomyopathy w/ biventricular involvement Cardiorenal syndrome Hypotension -appreciate AHF's management; had been previously referred to [facility] for transplant evaluation -failed lasix  challenge with inotropes, now requiring CRRT -GDMT limited by renal failure and shock; con't milrinone  with swan and coox monitoring  Anemia likely multifactorial 2/2 hemodilution d/t ADHF with prior GI blood loss -transfuse for Hb <7 or hemodynamically significant bleeding -monitor for bleeding  AKI on CKD IV 2/2 cardiorenal syndrome -CRRT -strict I/O -renally dose meds, avoid nephrotoxic meds  Chronic AFib -con't apixaban  -not requiring rate control meds  Right pleural effusion, s/p thoracentesis with 1.3L removed on 12/22  Gout-right hand -holding cochicine due to renal failure -prednisone  -allopurinol   Hx gastritis & esophagitis, EGD 10/27 for ABLA; friable gastric mucosa Diverticulosis (dx 2019) -PPI  Persistent left eye floaters since prior hospitalization -Scheduled for eye assessment  in Jan 2026  Hyperglycemia -SSI PRN; minimal requirements so far  Wife updated at bedside during rounds.   Labs:  CBC: Recent  Labs  Lab 06/14/24 1241 06/15/24 0237 06/16/24 0138 06/18/24 1302 06/19/24 0719  WBC 5.4 5.2 5.0  --  5.8  NEUTROABS 2.9  --   --   --   --   HGB 10.1* 9.5* 9.5* 10.9*  10.9* 9.1*  HCT 33.3* 30.4* 30.3* 32.0*  32.0* 29.4*  MCV 93.0 90.7 88.6  --  91.0  PLT 433* 378 392  --  349    Basic Metabolic Panel: Recent Labs  Lab 06/15/24 0237 06/16/24 0138 06/17/24 0243 06/18/24 0222 06/18/24 1302 06/19/24 0719 06/19/24 1541  NA 140 137 136 136 138  139 137 137  K 3.7 4.1 3.7 3.7 3.8  3.8 3.7 3.6  CL 101 99 97* 97*  --  100 100  CO2 26 24 23  19*  --  25 25  GLUCOSE 76 133* 139* 112*  --  133* 132*  BUN 53* 50* 55* 56*  --  38* 29*  CREATININE 3.00* 3.17* 3.66* 4.07*  --  2.60* 2.11*  CALCIUM  9.5 9.6 9.5 9.8  --  9.5 9.8  MG 2.1  --  2.1  --   --  2.3  --   PHOS 4.2  --   --   --   --  2.9 2.2*   GFR: Estimated Creatinine Clearance: 49.3 mL/min (A) (by C-G formula based on SCr of 2.11 mg/dL (H)). Recent Labs  Lab 06/14/24 1241 06/15/24 0237 06/16/24 0138 06/19/24 0719  WBC 5.4 5.2 5.0 5.8    Critical care time:      Leita SHAUNNA Gaskins, DO 06/19/2024 6:31 PM [facility]  For contact information, see Amion. If no response to pager, please call PCCM consult pager. After hours, 7PM- 7AM, please call Elink.   "

## 2024-06-19 NOTE — Plan of Care (Signed)
" °  Problem: Clinical Measurements: Goal: Respiratory complications will improve Outcome: Progressing   Problem: Clinical Measurements: Goal: Cardiovascular complication will be avoided Outcome: Progressing   Problem: Clinical Measurements: Goal: Diagnostic test results will improve Outcome: Progressing   Problem: Pain Managment: Goal: General experience of comfort will improve and/or be controlled Outcome: Progressing   Problem: Education: Goal: Knowledge of General Education information will improve Description: Including pain rating scale, medication(s)/side effects and non-pharmacologic comfort measures Outcome: Progressing   "

## 2024-06-19 NOTE — Progress Notes (Signed)
 Ramsay Bognar is an 58 y.o. male with hypertension, dyslipidemia, type 2 diabetes, gout, paroxysmal afib on Eliquis , systolic and end stage diastolic heart failure with restrictive cardiomyopathy (biopsy negative for cardiac amyloid), anemia, h/o CVA, former drinker and had been referred to [facility] for evaluation for a transplant now p/w SOB, DOE, s/p thoracentesis on 12/22 with 1.3 L output.  Unfortunately serum creatinine rising with poor urine output even with milrinone  and IV Lasix   160mg  IV BID, midodrine  TID.  Patient with a right heart cath on 06/18/2024 PCW 28 RA 19.   Assessment/Plan:  # Acute kidney injury on CKD 4 likely from cardiorenal syndrome: The patient had Right Heart CHF team and failing treatment with milrinone  plus high-dose diuretics.   Started CRRT 12/26 (~7PM) with net UF 50-150 cc/min as tolerated -> has been on 72ml/hr, tolerating CVP 10-12. Can increase to 170ml/hr as tolerated.  4K baths will follow for any need for adjustment; right IJ temp cath placed by AHF team   # Acute on chronic CHF with EF of 45 to 50%.  Failed treatment with inotropes and diuretics.  [facility] evaluation not till Jan 2026. He did not note any swelling in his legs but certainly has tr-1+ - See above; CRRT  # Hypotension on midodrine  15mg  TID.  Monitor BP. # Hypokalemia: Replete potassium chloride .  # Anemia: Hemoglobin 10.9  # Gout: prednisone  burst  Subjective: Feels about the same as yest, no signif improvement as far as dyspnea; denies f/c/n/v/cp.   Chemistry and CBC: Creat  Date/Time Value Ref Range Status  10/23/2023 10:05 AM 1.56 (H) 0.70 - 1.30 mg/dL Final  91/95/7978 90:93 AM 1.37 (H) 0.70 - 1.33 mg/dL Final    Comment:    For patients >52 years of age, the reference limit for Creatinine is approximately 13% higher for people identified as African-American. .    Creatinine, Ser  Date/Time Value Ref Range Status  06/18/2024 02:22 AM 4.07 (H) 0.61 -  1.24 mg/dL Final  87/74/7974 97:56 AM 3.66 (H) 0.61 - 1.24 mg/dL Final  87/75/7974 98:61 AM 3.17 (H) 0.61 - 1.24 mg/dL Final  87/76/7974 97:62 AM 3.00 (H) 0.61 - 1.24 mg/dL Final  87/77/7974 87:58 PM 3.06 (H) 0.61 - 1.24 mg/dL Final  88/86/7974 89:50 AM 4.39 (H) 0.40 - 1.50 mg/dL Final  88/89/7974 94:74 AM 4.39 (H) 0.61 - 1.24 mg/dL Final  88/90/7974 95:42 AM 4.58 (H) 0.61 - 1.24 mg/dL Final  88/91/7974 95:59 AM 4.69 (H) 0.61 - 1.24 mg/dL Final  88/92/7974 95:74 AM 4.74 (H) 0.61 - 1.24 mg/dL Final  88/93/7974 95:77 AM 4.78 (H) 0.61 - 1.24 mg/dL Final  88/94/7974 93:71 AM 4.72 (H) 0.61 - 1.24 mg/dL Final  88/95/7974 94:64 AM 4.97 (H) 0.61 - 1.24 mg/dL Final  88/96/7974 95:49 PM 4.88 (H) 0.61 - 1.24 mg/dL Final  88/96/7974 94:99 AM 4.82 (H) 0.61 - 1.24 mg/dL Final  88/97/7974 94:52 AM 5.17 (H) 0.61 - 1.24 mg/dL Final  88/98/7974 94:99 AM 5.08 (H) 0.61 - 1.24 mg/dL Final  89/68/7974 95:54 AM 4.82 (H) 0.61 - 1.24 mg/dL Final  89/69/7974 95:95 AM 4.49 (H) 0.61 - 1.24 mg/dL Final  89/70/7974 96:80 AM 4.25 (H) 0.61 - 1.24 mg/dL Final  89/71/7974 95:64 AM 4.00 (H) 0.61 - 1.24 mg/dL Final  89/72/7974 95:52 AM 3.97 (H) 0.61 - 1.24 mg/dL Final  89/74/7974 94:95 PM 3.13 (H) 0.61 - 1.24 mg/dL Final  89/98/7974 95:75 PM 2.47 (H) 0.61 - 1.24 mg/dL Final  90/69/7974  11:38 AM 2.42 (H) 0.40 - 1.50 mg/dL Final  90/74/7974 96:42 AM 2.88 (H) 0.61 - 1.24 mg/dL Final  90/75/7974 94:43 AM 3.05 (H) 0.61 - 1.24 mg/dL Final  90/76/7974 95:63 AM 2.79 (H) 0.61 - 1.24 mg/dL Final  90/77/7974 94:53 AM 3.08 (H) 0.61 - 1.24 mg/dL Final  90/78/7974 96:61 AM 3.16 (H) 0.61 - 1.24 mg/dL Final  90/79/7974 95:81 AM 2.63 (H) 0.61 - 1.24 mg/dL Final  90/80/7974 90:57 AM 2.53 (H) 0.61 - 1.24 mg/dL Final  93/73/7974 90:73 AM 1.46 (H) 0.76 - 1.27 mg/dL Final  89/91/7975 91:48 AM 1.60 (H) 0.76 - 1.27 mg/dL Final  93/74/7975 91:57 AM 1.47 0.40 - 1.50 mg/dL Final  94/77/7975 88:99 AM 1.74 (H) 0.40 - 1.50 mg/dL Final   94/80/7975 96:78 AM 1.29 (H) 0.61 - 1.24 mg/dL Final  94/81/7975 96:88 PM 1.37 (H) 0.61 - 1.24 mg/dL Final  94/81/7975 87:54 AM 1.54 (H) 0.61 - 1.24 mg/dL Final  94/82/7975 89:47 PM 1.80 (H) 0.61 - 1.24 mg/dL Final  94/82/7975 89:48 PM 1.66 (H) 0.61 - 1.24 mg/dL Final  96/72/7976 87:41 PM 1.60 (H) 0.40 - 1.50 mg/dL Final  96/77/7976 89:92 AM 1.75 (H) 0.40 - 1.50 mg/dL Final  96/97/7977 90:65 AM 1.41 0.40 - 1.50 mg/dL Final  98/79/7977 97:50 PM 1.35 (H) 0.76 - 1.27 mg/dL Final  90/98/7978 88:53 AM 1.27 0.76 - 1.27 mg/dL Final  87/96/7979 96:86 PM 1.25 0.40 - 1.50 mg/dL Final  87/73/7980 97:82 PM 1.32 (H) 0.61 - 1.24 mg/dL Final  89/88/7980 91:71 AM 1.34 0.40 - 1.50 mg/dL Final  88/84/7981 89:99 AM 1.17 0.40 - 1.50 mg/dL Final  98/76/7981 97:50 PM 1.11 0.40 - 1.50 mg/dL Final  98/83/7981 93:64 AM 1.18 0.61 - 1.24 mg/dL Final   Recent Labs  Lab 06/14/24 1241 06/15/24 0237 06/16/24 0138 06/17/24 0243 06/18/24 0222 06/18/24 1302  NA 139 140 137 136 136 138  139  K 4.4 3.7 4.1 3.7 3.7 3.8  3.8  CL 100 101 99 97* 97*  --   CO2 28 26 24 23  19*  --   GLUCOSE 78 76 133* 139* 112*  --   BUN 50* 53* 50* 55* 56*  --   CREATININE 3.06* 3.00* 3.17* 3.66* 4.07*  --   CALCIUM  9.9 9.5 9.6 9.5 9.8  --   PHOS  --  4.2  --   --   --   --    Recent Labs  Lab 06/14/24 1241 06/15/24 0237 06/16/24 0138 06/18/24 1302  WBC 5.4 5.2 5.0  --   NEUTROABS 2.9  --   --   --   HGB 10.1* 9.5* 9.5* 10.9*  10.9*  HCT 33.3* 30.4* 30.3* 32.0*  32.0*  MCV 93.0 90.7 88.6  --   PLT 433* 378 392  --    Liver Function Tests: Recent Labs  Lab 06/14/24 1241 06/15/24 0237  AST 33 27  ALT 40 23  ALKPHOS 152* 135*  BILITOT 0.9 0.8  PROT 7.4 6.5  ALBUMIN  4.1 3.5   No results for input(s): LIPASE, AMYLASE in the last 168 hours. No results for input(s): AMMONIA in the last 168 hours. Cardiac Enzymes: No results for input(s): CKTOTAL, CKMB, CKMBINDEX, TROPONINI in the last 168  hours. Iron  Studies: No results for input(s): IRON , TIBC, TRANSFERRIN, FERRITIN in the last 72 hours. PT/INR: @LABRCNTIP (inr:5)  Xrays/Other Studies: ) Results for orders placed or performed during the hospital encounter of 06/14/24 (from the past 48 hours)  Basic metabolic panel  Status: Abnormal   Collection Time: 06/18/24  2:22 AM  Result Value Ref Range   Sodium 136 135 - 145 mmol/L   Potassium 3.7 3.5 - 5.1 mmol/L   Chloride 97 (L) 98 - 111 mmol/L   CO2 19 (L) 22 - 32 mmol/L   Glucose, Bld 112 (H) 70 - 99 mg/dL    Comment: Glucose reference range applies only to samples taken after fasting for at least 8 hours.   BUN 56 (H) 6 - 20 mg/dL   Creatinine, Ser 5.92 (H) 0.61 - 1.24 mg/dL   Calcium  9.8 8.9 - 10.3 mg/dL   GFR, Estimated 16 (L) >60 mL/min    Comment: (NOTE) Calculated using the CKD-EPI Creatinine Equation (2021)    Anion gap 20 (H) 5 - 15    Comment: Performed at [facility], [address], [place], [place] [address]  POCT I-Stat EG7     Status: Abnormal   Collection Time: 06/18/24  1:02 PM  Result Value Ref Range   pH, Ven 7.396 7.25 - 7.43   pCO2, Ven 39.1 (L) 44 - 60 mmHg   pO2, Ven 30 (LL) 32 - 45 mmHg   Bicarbonate 24.0 20.0 - 28.0 mmol/L   TCO2 25 22 - 32 mmol/L   O2 Saturation 57 %   Acid-base deficit 1.0 0.0 - 2.0 mmol/L   Sodium 139 135 - 145 mmol/L   Potassium 3.8 3.5 - 5.1 mmol/L   Calcium , Ion 1.17 1.15 - 1.40 mmol/L   HCT 32.0 (L) 39.0 - 52.0 %   Hemoglobin 10.9 (L) 13.0 - 17.0 g/dL   Sample type VENOUS    Comment NOTIFIED PHYSICIAN   POCT I-Stat EG7     Status: Abnormal   Collection Time: 06/18/24  1:02 PM  Result Value Ref Range   pH, Ven 7.384 7.25 - 7.43   pCO2, Ven 39.7 (L) 44 - 60 mmHg   pO2, Ven 30 (LL) 32 - 45 mmHg   Bicarbonate 23.7 20.0 - 28.0 mmol/L   TCO2 25 22 - 32 mmol/L   O2 Saturation 56 %   Acid-base deficit 1.0 0.0 - 2.0 mmol/L   Sodium 138 135 - 145 mmol/L   Potassium 3.8 3.5 - 5.1 mmol/L   Calcium ,  Ion 1.20 1.15 - 1.40 mmol/L   HCT 32.0 (L) 39.0 - 52.0 %   Hemoglobin 10.9 (L) 13.0 - 17.0 g/dL   Sample type VENOUS    Comment NOTIFIED PHYSICIAN   Glucose, capillary     Status: Abnormal   Collection Time: 06/18/24  3:28 PM  Result Value Ref Range   Glucose-Capillary 145 (H) 70 - 99 mg/dL    Comment: Glucose reference range applies only to samples taken after fasting for at least 8 hours.  MRSA Next Gen by PCR, Nasal     Status: None   Collection Time: 06/18/24  4:11 PM   Specimen: Nasal Mucosa; Nasal Swab  Result Value Ref Range   MRSA by PCR Next Gen NOT DETECTED NOT DETECTED    Comment: (NOTE) The GeneXpert MRSA Assay (FDA approved for NASAL specimens only), is one component of a comprehensive MRSA colonization surveillance program. It is not intended to diagnose MRSA infection nor to guide or monitor treatment for MRSA infections. Test performance is not FDA approved in patients less than 72 years old. Performed at [facility], [address] [place], [place] [address]   Hemoglobin A1c     Status: None  Collection Time: 06/18/24  6:02 PM  Result Value Ref Range   Hgb A1c MFr Bld 5.5 4.8 - 5.6 %    Comment: (NOTE) Diagnosis of Diabetes The following HbA1c ranges recommended by the American Diabetes Association (ADA) may be used as an aid in the diagnosis of diabetes mellitus.  Hemoglobin             Suggested A1C NGSP%              Diagnosis  <5.7                   Non Diabetic  5.7-6.4                Pre-Diabetic  >6.4                   Diabetic  <7.0                   Glycemic control for                       adults with diabetes.     Mean Plasma Glucose 111.15 mg/dL    Comment: Performed at [facility], [address] [place], [place] [address]  Glucose, capillary     Status: Abnormal   Collection Time: 06/18/24  8:42 PM  Result Value Ref Range   Glucose-Capillary 150 (H) 70 - 99 mg/dL    Comment: Glucose reference range applies only to  samples taken after fasting for at least 8 hours.  Glucose, capillary     Status: Abnormal   Collection Time: 06/19/24  6:42 AM  Result Value Ref Range   Glucose-Capillary 118 (H) 70 - 99 mg/dL    Comment: Glucose reference range applies only to samples taken after fasting for at least 8 hours.   CARDIAC CATHETERIZATION Result Date: 06/18/2024 Findings: On milrinone  0.125 mcg/kg/min RA = 19 RV = 49/19 PA = 51/29 (36) PCW = 28 Fick cardiac output/index = 6.2/2.6 Thermo CO/CI 3.7/1.6 PVR = 1.1 (Fick) 1.9 (TD) Ao sat = 95% PA sat = 56%, 57% PAPi = 1.1 Assessment: 1. Markedly elevated filling pressures with low cardiac output by TD 2. Progressive cardiorenal syndrome Plan/Discussion: Move to ICU. Continue inotrope support. Start CRRT. Will need to consider transplant options. Toribio Fuel, MD 1:45 PM  PERIPHERAL VASCULAR CATHETERIZATION Result Date: 06/18/2024 Findings: On milrinone  0.125 mcg/kg/min RA = 19 RV = 49/19 PA = 51/29 (36) PCW = 28 Fick cardiac output/index = 6.2/2.6 Thermo CO/CI 3.7/1.6 PVR = 1.1 (Fick) 1.9 (TD) Ao sat = 95% PA sat = 56%, 57% PAPi = 1.1 Assessment: 1. Markedly elevated filling pressures with low cardiac output by TD 2. Progressive cardiorenal syndrome Plan/Discussion: Move to ICU. Continue inotrope support. Start CRRT. Will need to consider transplant options. Toribio Fuel, MD 1:45 PM   PMH:   Past Medical History:  Diagnosis Date   Blood in stool    bright red blood    Chicken pox    Diabetes mellitus (HCC)    Erectile dysfunction    Gout    Hypertension    Stroke [facility])     PSH:   Past Surgical History:  Procedure Laterality Date   APPENDECTOMY  2004   COLONOSCOPY     COLONOSCOPY N/A 04/22/2024   Procedure: COLONOSCOPY;  Surgeon: Stacia Glendia BRAVO, MD;  Location: [facility] ENDOSCOPY;  Service: Gastroenterology;  Laterality: N/A;   ENDOMYOCARDIAL BIOPSY N/A 03/16/2024  Procedure: ENDOMYOCARDIAL BIOPSY;  Surgeon: Zenaida Morene PARAS, MD;  Location: [facility]  INVASIVE CV LAB;  Service: Cardiovascular;  Laterality: N/A;   ESOPHAGOGASTRODUODENOSCOPY N/A 04/19/2024   Procedure: EGD (ESOPHAGOGASTRODUODENOSCOPY);  Surgeon: Stacia Glendia BRAVO, MD;  Location: [facility] ENDOSCOPY;  Service: Gastroenterology;  Laterality: N/A;   IR THORACENTESIS ASP PLEURAL SPACE W/IMG GUIDE  06/14/2024   RIGHT HEART CATH N/A 03/16/2024   Procedure: RIGHT HEART CATH;  Surgeon: Zenaida Morene PARAS, MD;  Location: [facility] INVASIVE CV LAB;  Service: Cardiovascular;  Laterality: N/A;   RIGHT HEART CATH N/A 04/23/2024   Procedure: RIGHT HEART CATH;  Surgeon: Rolan Ezra RAMAN, MD;  Location: [facility] INVASIVE CV LAB;  Service: Cardiovascular;  Laterality: N/A;   TOOTH EXTRACTION  07/06/2020    Allergies: Allergies[1]  Medications:   Prior to Admission medications  Medication Sig Start Date End Date Taking? Authorizing Provider  buPROPion  (WELLBUTRIN  XL) 300 MG 24 hr tablet TAKE 1 TABLET BY MOUTH ONCE  DAILY 04/13/24  Yes Nafziger, Darleene, NP  colchicine  0.6 MG tablet TAKE 1 TABLET BY MOUTH DAILY  WHEN HAVING FLARES TAKE 1 TABLET BY MOUTH TWICE DAILY 09/05/23  Yes Nafziger, Darleene, NP  ELIQUIS  5 MG TABS tablet TAKE 1 TABLET BY MOUTH TWICE  DAILY 04/27/24  Yes West, Katlyn D, NP  hydrOXYzine  (VISTARIL ) 50 MG capsule Take 1 capsule (50 mg total) by mouth every 8 (eight) hours as needed. 03/23/24  Yes Nafziger, Darleene, NP  midodrine  (PROAMATINE ) 5 MG tablet Take 3 tablets (15 mg total) by mouth 3 (three) times daily with meals. 05/03/24  Yes Tobie Yetta HERO, MD  Multiple Vitamins-Minerals (ONE-A-DAY MENS 50+) TABS Take 1 tablet by mouth daily with breakfast.   Yes [provider]  oxybutynin  (DITROPAN ) 5 MG tablet Take 1 tablet (5 mg total) by mouth every 8 (eight) hours as needed for bladder spasms. 05/18/24  Yes Rolan Ezra RAMAN, MD  pantoprazole  (PROTONIX ) 40 MG tablet Take 1 tablet (40 mg total) by mouth daily. 05/18/24  Yes Rolan Ezra RAMAN, MD  potassium chloride  SA (KLOR-CON  M) 20 MEQ tablet  Take 2 tablets (40 mEq total) by mouth 2 (two) times daily. 05/18/24  Yes Rolan Ezra RAMAN, MD  torsemide  (DEMADEX ) 20 MG tablet Take 3 tablets (60 mg total) by mouth 2 (two) times daily. 05/03/24  Yes Tobie Yetta HERO, MD  triamcinolone  cream (KENALOG ) 0.5 % Apply 1 Application topically as needed (Irritation).   Yes [provider]    Discontinued Meds:   Medications Discontinued During This Encounter  Medication Reason   Insulin  Pen Needle (B-D UF III MINI PEN NEEDLES) 31G X 5 MM MISC    Lancets (ONETOUCH ULTRASOFT) lancets    glucose blood (ONETOUCH ULTRA TEST) test strip    blood glucose meter kit and supplies    famotidine  (PEPCID ) 20 MG tablet Patient Preference   magnesium  oxide (MAG-OX) 400 (240 Mg) MG tablet Patient Preference   rosuvastatin  (CRESTOR ) 20 MG tablet Patient Preference   thiamine  (VITAMIN B-1) 100 MG tablet Patient Preference   furosemide  (LASIX ) 120 mg in dextrose  5 % 50 mL IVPB    furosemide  (LASIX ) injection 120 mg    furosemide  (LASIX ) 120 mg in dextrose  5 % 50 mL IVPB    furosemide  (LASIX ) 120 mg in dextrose  5 % 50 mL IVPB    predniSONE  (DELTASONE ) tablet 20 mg    furosemide  (LASIX ) 160 mg in dextrose  5 % 50 mL IVPB    sodium chloride  flush (NS) 0.9 % injection  3 mL    sodium chloride  flush (NS) 0.9 % injection 3 mL    rosuvastatin  (CRESTOR ) tablet 20 mg    colchicine  tablet 0.6 mg    sodium chloride  flush (NS) 0.9 % injection 3 mL Patient Transfer   sodium chloride  flush (NS) 0.9 % injection 3 mL Patient Transfer   0.9 %  sodium chloride  infusion Patient Transfer   Heparin  (Porcine) in NaCl 1000-0.9 UT/500ML-% SOLN Patient Transfer   fentaNYL  (SUBLIMAZE ) injection Patient Transfer   midazolam  PF (VERSED ) injection Patient Transfer   lidocaine  (PF) (XYLOCAINE ) 1 % injection Patient Transfer   midodrine  (PROAMATINE ) tablet 15 mg     Social History:  reports that he quit smoking about 3 months ago. His smoking use included cigarettes and cigars.  He has never used smokeless tobacco. He reports that he does not currently use alcohol. He reports that he does not currently use drugs after having used the following drugs: Marijuana.  Family History:   Family History  Problem Relation Age of Onset   Alcohol abuse Father    Hypertension Father    Heart disease Father    Gout Father    Breast cancer Mother    Hypertension Mother    Gout Mother    Healthy Sister    Healthy Daughter    Colon cancer Neg Hx    Esophageal cancer Neg Hx    Rectal cancer Neg Hx    Stomach cancer Neg Hx     Blood pressure 112/85, pulse 88, temperature 98.2 F (36.8 C), resp. rate 12, height 6' 5 (1.956 m), weight 102.7 kg, SpO2 93%. Physical Exam: General exam: Appears calm and comfortable, NAD, pleasant Respiratory system: CTA b/l, no rales Cardiovascular system: S1 & S2 heard, RRR.   Gastrointestinal system: Abdomen is nondistended, soft and nontender. Normal bowel sounds  Central nervous system: Alert and oriented. No focal neurological deficits. Extremities: LE edema present tr-1+ Skin: No rashes, lesions or ulcers     Neamiah Sciarra, LYNWOOD ORN, MD 06/19/2024, 7:26 AM      [1] No Known Allergies

## 2024-06-19 NOTE — Progress Notes (Signed)
 Initial Nutrition Assessment  DOCUMENTATION CODES:  Not applicable  INTERVENTION:  Liberalize to 2g sodium diet to allow for additional options Double protein with meals Magic cup TID with meals, each supplement provides 290 kcal and 9 grams of protein MVI with minerals daily   NUTRITION DIAGNOSIS:   Increased nutrient needs related to acute illness (CHF exacerbation on CRRT for volume removal) as evidenced by estimated needs.  GOAL:   Patient will meet greater than or equal to 90% of their needs  MONITOR:   PO intake, Supplement acceptance, I & O's, Labs, Weight trends  REASON FOR ASSESSMENT:   Consult Assessment of nutrition requirement/status (CRRT)  ASSESSMENT:   Pt with hx of end-stage diastolic heart failure, prior EtOH abuse, HTN, HLD, type 2 DM, CKD stage IIIa, prior stroke, paroxysmal A-fib, and gout presented to ED for SOB worsening x 2 weeks since last admission. Imaging in ED showed right pleural effusion and compressive atelectasis.   12/22 - presented to ED, right thoracentesis, 1.3L removed 12/26 - RHC, showed progressive cardiorenal syndrome, transferred to [facility] for CRRT initiation   Patient with poor diuresis this admission and worsening renal function, transferred to ICU for CRRT with swan in place. Currently CRRT goal is net UF 50-150 cc/min as tolerated.  No intake recorded from earlier in admission. Weight stable for several months, but high chance of inaccuracy due to fluid status.   Pt currently with elevated nutrition needs due to CRRT. Last HgbA1c noted to be 5.5%. Will liberalize diet and add nutrition supplements. Will monitor intake trends for the need for more aggressive nutrition interventions.   Admit weight: 103.8 kg  Current weight: 102.7 kg    Intake/Output Summary (Last 24 hours) at 06/19/2024 0833 Last data filed at 06/19/2024 0700 Gross per 24 hour  Intake 608.85 ml  Output 1441.3 ml  Net -832.45 ml  Net IO Since Admission:  -3,618.43 mL [06/19/24 0833]  Drains/Lines: Swan - left IJ Temporary HD catheter, right IJ, triple lumen UOP x 24 hours CRRT removed 766 x 24 hours  Nutritionally Relevant Medications: Scheduled Meds:  allopurinol   50 mg Oral Daily   colchicine   0.6 mg Oral Daily   docusate sodium   100 mg Oral BID   famotidine   20 mg Oral Daily   insulin  aspart  1-3 Units Subcutaneous TID AC & HS   pantoprazole   40 mg Oral Daily   rosuvastatin   10 mg Oral Daily   senna  1 tablet Oral BID   Continuous Infusions:  milrinone  0.125 mcg/kg/min (06/19/24 0400)   norepinephrine  (LEVOPHED ) Adult infusion     PRN Meds: ondansetron    Labs Reviewed: BUN 38, creatinine 2.6 CBG ranges from 118-150 mg/dL over the last 24 hours HgbA1c 5.5% (12/26)  NUTRITION - FOCUSED PHYSICAL EXAM: Defer to in-person assessment  Diet Order:   Diet Order             Diet heart healthy/carb modified Room service appropriate? Yes; Fluid consistency: Thin  Diet effective now                   EDUCATION NEEDS:  No education needs have been identified at this time  Skin:  Skin Assessment: Reviewed RN Assessment  Last BM:  12/25  Height:  Ht Readings from Last 1 Encounters:  06/14/24 6' 5 (1.956 m)    Weight:  Wt Readings from Last 1 Encounters:  06/19/24 102.7 kg    Ideal Body Weight:  94.5 kg  BMI:  Body mass index is 26.85 kg/m.  Estimated Nutritional Needs:  Kcal:  2400-2600 kcal/d Protein:  140-155g/d Fluid:  2.5L/d    Vernell Lukes, RD, LDN, CNSC Registered Dietitian II Please reach out via secure chat

## 2024-06-19 NOTE — Plan of Care (Signed)
  Problem: Clinical Measurements: Goal: Ability to maintain clinical measurements within normal limits will improve Outcome: Progressing Goal: Diagnostic test results will improve Outcome: Progressing   Problem: Nutrition: Goal: Adequate nutrition will be maintained Outcome: Progressing   Problem: Coping: Goal: Level of anxiety will decrease Outcome: Progressing   

## 2024-06-20 ENCOUNTER — Inpatient Hospital Stay (HOSPITAL_COMMUNITY)

## 2024-06-20 DIAGNOSIS — I5021 Acute systolic (congestive) heart failure: Secondary | ICD-10-CM

## 2024-06-20 DIAGNOSIS — I5023 Acute on chronic systolic (congestive) heart failure: Secondary | ICD-10-CM | POA: Diagnosis not present

## 2024-06-20 LAB — CBC
HCT: 30 % — ABNORMAL LOW (ref 39.0–52.0)
Hemoglobin: 9.2 g/dL — ABNORMAL LOW (ref 13.0–17.0)
MCH: 28 pg (ref 26.0–34.0)
MCHC: 30.7 g/dL (ref 30.0–36.0)
MCV: 91.2 fL (ref 80.0–100.0)
Platelets: 319 K/uL (ref 150–400)
RBC: 3.29 MIL/uL — ABNORMAL LOW (ref 4.22–5.81)
RDW: 17.6 % — ABNORMAL HIGH (ref 11.5–15.5)
WBC: 4.6 K/uL (ref 4.0–10.5)
nRBC: 0 % (ref 0.0–0.2)

## 2024-06-20 LAB — RENAL FUNCTION PANEL
Albumin: 3.7 g/dL (ref 3.5–5.0)
Albumin: 3.6 g/dL (ref 3.5–5.0)
Anion gap: 8 (ref 5–15)
Anion gap: 9 (ref 5–15)
BUN: 22 mg/dL — ABNORMAL HIGH (ref 6–20)
BUN: 15 mg/dL (ref 6–20)
CO2: 27 mmol/L (ref 22–32)
CO2: 23 mmol/L (ref 22–32)
Calcium: 9.1 mg/dL (ref 8.9–10.3)
Calcium: 8.4 mg/dL — ABNORMAL LOW (ref 8.9–10.3)
Chloride: 102 mmol/L (ref 98–111)
Chloride: 104 mmol/L (ref 98–111)
Creatinine, Ser: 1.85 mg/dL — ABNORMAL HIGH (ref 0.61–1.24)
Creatinine, Ser: 1.51 mg/dL — ABNORMAL HIGH (ref 0.61–1.24)
GFR, Estimated: 42 mL/min — ABNORMAL LOW
GFR, Estimated: 54 mL/min — ABNORMAL LOW
Glucose, Bld: 114 mg/dL — ABNORMAL HIGH (ref 70–99)
Glucose, Bld: 102 mg/dL — ABNORMAL HIGH (ref 70–99)
Phosphorus: 2.3 mg/dL — ABNORMAL LOW (ref 2.5–4.6)
Phosphorus: 2.2 mg/dL — ABNORMAL LOW (ref 2.5–4.6)
Potassium: 3.6 mmol/L (ref 3.5–5.1)
Potassium: 3.5 mmol/L (ref 3.5–5.1)
Sodium: 137 mmol/L (ref 135–145)
Sodium: 136 mmol/L (ref 135–145)

## 2024-06-20 LAB — ECHOCARDIOGRAM LIMITED
Calc EF: 30.5 %
Height: 77 in
S' Lateral: 3.7 cm
Single Plane A2C EF: 29.8 %
Single Plane A4C EF: 31.5 %
Weight: 3481.5 [oz_av]

## 2024-06-20 LAB — BASIC METABOLIC PANEL WITH GFR
Anion gap: 8 (ref 5–15)
BUN: 22 mg/dL — ABNORMAL HIGH (ref 6–20)
CO2: 27 mmol/L (ref 22–32)
Calcium: 8.9 mg/dL (ref 8.9–10.3)
Chloride: 102 mmol/L (ref 98–111)
Creatinine, Ser: 1.85 mg/dL — ABNORMAL HIGH (ref 0.61–1.24)
GFR, Estimated: 42 mL/min — ABNORMAL LOW
Glucose, Bld: 114 mg/dL — ABNORMAL HIGH (ref 70–99)
Potassium: 3.6 mmol/L (ref 3.5–5.1)
Sodium: 137 mmol/L (ref 135–145)

## 2024-06-20 LAB — COOXEMETRY PANEL
Carboxyhemoglobin: 1.5 % (ref 0.5–1.5)
Methemoglobin: <0.7 % (ref 0.0–1.5)
O2 Saturation: 62.2 %
Total hemoglobin: 9.5 g/dL — ABNORMAL LOW (ref 12.0–16.0)

## 2024-06-20 LAB — GLUCOSE, CAPILLARY
Glucose-Capillary: 110 mg/dL — ABNORMAL HIGH (ref 70–99)
Glucose-Capillary: 128 mg/dL — ABNORMAL HIGH (ref 70–99)
Glucose-Capillary: 105 mg/dL — ABNORMAL HIGH (ref 70–99)

## 2024-06-20 LAB — MAGNESIUM: Magnesium: 2.5 mg/dL — ABNORMAL HIGH (ref 1.7–2.4)

## 2024-06-20 MED ORDER — POTASSIUM PHOSPHATES 15 MMOLE/5ML IV SOLN
15.0000 mmol | Freq: Once | INTRAVENOUS | Status: AC
  Administered 2024-06-20: 15 mmol via INTRAVENOUS
  Filled 2024-06-20: qty 5

## 2024-06-20 MED ORDER — TRAZODONE HCL 50 MG PO TABS
50.0000 mg | ORAL_TABLET | Freq: Every day | ORAL | Status: DC
  Administered 2024-06-20: 50 mg via ORAL
  Filled 2024-06-20: qty 1

## 2024-06-20 MED ORDER — FAMOTIDINE 20 MG PO TABS
10.0000 mg | ORAL_TABLET | Freq: Every day | ORAL | Status: DC
  Administered 2024-06-21 – 2024-06-23 (×3): 10 mg via ORAL

## 2024-06-20 NOTE — Plan of Care (Signed)
  Problem: Clinical Measurements: Goal: Ability to maintain clinical measurements within normal limits will improve Outcome: Progressing Goal: Diagnostic test results will improve Outcome: Progressing   Problem: Nutrition: Goal: Adequate nutrition will be maintained Outcome: Progressing   Problem: Coping: Goal: Level of anxiety will decrease Outcome: Progressing   

## 2024-06-20 NOTE — Progress Notes (Signed)
 Echocardiogram 2D Echocardiogram has been performed.  Merlynn Argyle 06/20/2024, 1:58 PM

## 2024-06-20 NOTE — Progress Notes (Signed)
 Echo attempted, patient care in room 11:27 AM 06/20/24

## 2024-06-20 NOTE — Progress Notes (Signed)
 "  NAME:  Keith Leblanc, MRN:  [id number], DOB:  04-20-1966, LOS: 6 ADMISSION DATE:  06/14/2024, CONSULTATION DATE:  06/18/24 REFERRING MD:  Noralee HERO, CHIEF COMPLAINT:  dyspnea x2 weeks  History of Present Illness:  Keith Leblanc is a 58 yo male with past medical history significant for end-stage diastolic heart failure, Esophagitis/gastritis, former ETOH abuse, DM2, HTN, CKD IV, prior CVA, PAF on Eliquis  who presented 12/22 with 2-weeks dyspnea, worsening heart failure. CXR showed right pleural effusion and patient underwent R thoracentesis with 1.3L removed. Heart failure following and started on milrinone /lasix  with minimal response, planned for RHC today. PCCM consulted for ICU admission post-cath.  Patient recently hospitalized here at [facility] 04/17/24-05/03/2024 for acute on chronic decompensated heart failure s/p RHC with elevated filling pressures, reduced index, LHC deferred d/t renal function, and was treated with inotropes and diuretics.  Pertinent Medical History:   Past Medical History:  Diagnosis Date   Blood in stool    bright red blood    Chicken pox    Diabetes mellitus (HCC)    Erectile dysfunction    Gout    Hypertension    Stroke (HCC)    Significant Hospital Events: Including procedures, antibiotic start and stop dates in addition to other pertinent events   12/22 Admit [facility] worsening heart failure; started on milrinone /lasix ; CXR right pleural effusion s/p thoracentesis with 1.3L removed 12/23-12/25 minimal response to inotropes/lasix >worsening renal function-lasix  gtt stopped 12/26: ICU post-RHC  Interim History / Subjective:  No SOB. Feeling ok. Eating well. No constipation.  Objective    Blood pressure 111/81, pulse 75, temperature 98.4 F (36.9 C), resp. rate 10, height 6' 5 (1.956 m), weight 98.7 kg, SpO2 99%. PAP: (26-49)/(3-26) 32/16 CVP:  [0 mmHg-16 mmHg] 7 mmHg PCWP:  [15 mmHg-16 mmHg] 16 mmHg CO:  [3.1 L/min-5.4 L/min] 4 L/min CI:  [1.3  L/min/m2-2.35 L/min/m2] 1.75 L/min/m2      Intake/Output Summary (Last 24 hours) at 06/20/2024 1848 Last data filed at 06/20/2024 1800 Gross per 24 hour  Intake 1286 ml  Output 4541.4 ml  Net -3255.4 ml   Filed Weights   06/18/24 0604 06/19/24 0600 06/20/24 0500  Weight: 103.1 kg 102.7 kg 98.7 kg    I/O -3.5L, net -7.1L for admission Examination: General: middle aged man sitting up in bed eating in NAD HEENT: [place]/AT,  eyes anicteric Pulm: breathing comfortably on RA, CTAB CV: S1S2, RRR GI: soft, NT Neuro: awake, alert, moving all extremities Extremities: mild distal LE edema  CVP 8  Labs reviewed.  [place]ox 52% on milrinone  0.113mcg BUN 15, Cr 1.51 on CRRT  Resolved Problem List:   Assessment and Plan:   End stage combined acute on chronic HFrEF Restrictive cardiomyopathy w/ biventricular involvement Cardiorenal syndrome Hypotension -appreciate AHF's management; had been previously referred to [facility] for transplant evaluation but has not yet been able to established care there -CRRT after failed lasix  challenge. -GDMT limited by shock & renal failure  Anemia likely multifactorial 2/2 hemodilution d/t ADHF with prior GI blood loss -monitor for bleeding -transfuse for Hb <7 or hemodynamically significant bleeding  AKI on CKD IV 2/2 cardiorenal syndrome -CRRT -strict I/O -renally dose meds, avoid nephrotoxic meds  Chronic AFib -not requiring rate control meds for Afib -apixaban ; if he needs TDC at any point we will need to hold this  Right pleural effusion, s/p thoracentesis with 1.3L removed on 12/22  Gout-right hand -Holding colchicine  due to renal failure -allopurinol  -short course of pred  Hx gastritis & esophagitis,  EGD 10/27 for ABLA; friable gastric mucosa Diverticulosis (dx 2019) -PPI  Persistent left eye floaters since prior hospitalization -Scheduled for eye assessment in Jan 2026  Hyperglycemia -SSI PRN-- minimal requirements -goal BG  140-180    Labs:  CBC: Recent Labs  Lab 06/14/24 1241 06/15/24 0237 06/16/24 0138 06/18/24 1302 06/19/24 0719 06/20/24 0411  WBC 5.4 5.2 5.0  --  5.8 4.6  NEUTROABS 2.9  --   --   --   --   --   HGB 10.1* 9.5* 9.5* 10.9*  10.9* 9.1* 9.2*  HCT 33.3* 30.4* 30.3* 32.0*  32.0* 29.4* 30.0*  MCV 93.0 90.7 88.6  --  91.0 91.2  PLT 433* 378 392  --  349 319    Basic Metabolic Panel: Recent Labs  Lab 06/15/24 0237 06/16/24 0138 06/17/24 0243 06/18/24 0222 06/18/24 1302 06/19/24 0719 06/19/24 1541 06/20/24 0411 06/20/24 1600  NA 140   < > 136 136 138  139 137 137 137  137 136  K 3.7   < > 3.7 3.7 3.8  3.8 3.7 3.6 3.6  3.6 3.5  CL 101   < > 97* 97*  --  100 100 102  102 104  CO2 26   < > 23 19*  --  25 25 27  27 23   GLUCOSE 76   < > 139* 112*  --  133* 132* 114*  114* 102*  BUN 53*   < > 55* 56*  --  38* 29* 22*  22* 15  CREATININE 3.00*   < > 3.66* 4.07*  --  2.60* 2.11* 1.85*  1.85* 1.51*  CALCIUM  9.5   < > 9.5 9.8  --  9.5 9.8 8.9  9.1 8.4*  MG 2.1  --  2.1  --   --  2.3  --  2.5*  --   PHOS 4.2  --   --   --   --  2.9 2.2* 2.3* 2.2*   < > = values in this interval not displayed.   GFR: Estimated Creatinine Clearance: 68.8 mL/min (A) (by C-G formula based on SCr of 1.51 mg/dL (H)). Recent Labs  Lab 06/15/24 0237 06/16/24 0138 06/19/24 0719 06/20/24 0411  WBC 5.2 5.0 5.8 4.6    Critical care time:      Leita SHAUNNA Gaskins, DO 06/20/2024 6:59 PM [facility]  For contact information, see Amion. If no response to pager, please call PCCM consult pager. After hours, 7PM- 7AM, please call Elink.   "

## 2024-06-20 NOTE — Progress Notes (Signed)
 Keith Leblanc is an 58 y.o. male with hypertension, dyslipidemia, type 2 diabetes, gout, paroxysmal afib on Eliquis , systolic and end stage diastolic heart failure with restrictive cardiomyopathy (biopsy negative for cardiac amyloid), anemia, h/o CVA, former drinker and had been referred to [facility] for evaluation for a transplant now p/w SOB, DOE, s/p thoracentesis on 12/22 with 1.3 L output.  Unfortunately serum creatinine rising with poor urine output even with milrinone  and IV Lasix   160mg  IV BID, midodrine  TID.  Patient with a right heart cath on 06/18/2024 PCW 28 RA 19.   Assessment/Plan:  # Acute kidney injury on CKD 4 likely from cardiorenal syndrome: The patient had Right Heart CHF team and failing treatment with milrinone  plus high-dose diuretics.   Started CRRT 12/26 (~7PM) with net UF 50-150 cc/min as tolerated -> has been on 33ml/hr, tolerating CVP 10. Tolerating 156mL/hr sometimes higher; written for 50-150. D/W and would not go above 150. We had a difficult time at 41ml/hr 1st 24hrs.  4K baths will follow for any need for adjustment; right IJ temp cath placed by AHF team  Much more down today with the process as not likely to be able to keep his iappt with [facility] on 1/5.   # Acute on chronic CHF with EF of 45 to 50%.  Failed treatment with inotropes and diuretics.  [facility] kidney and heart transplant evaluation not till Jan 2026. He did not note any swelling in his legs but certainly has tr-1+ - See above; CRRT  # Hypotension on midodrine  15mg  TID.  Monitor BP. # Hypokalemia: Replete potassium chloride .  # Anemia: Hemoglobin 10.9  # Gout: prednisone  burst  Subjective: Feels about the same as yest but more depressed today, no signif improvement as far as dyspnea; denies f/c/n/v/cp.   Chemistry and CBC: Creat  Date/Time Value Ref Range Status  10/23/2023 10:05 AM 1.56 (H) 0.70 - 1.30 mg/dL Final  91/95/7978 90:93 AM 1.37 (H) 0.70 - 1.33 mg/dL Final    Comment:    For patients >90  years of age, the reference limit for Creatinine is approximately 13% higher for people identified as African-American. .    Creatinine, Ser  Date/Time Value Ref Range Status  06/20/2024 04:11 AM 1.85 (H) 0.61 - 1.24 mg/dL Final  87/71/7974 95:88 AM 1.85 (H) 0.61 - 1.24 mg/dL Final  87/72/7974 96:58 PM 2.11 (H) 0.61 - 1.24 mg/dL Final  87/72/7974 92:80 AM 2.60 (H) 0.61 - 1.24 mg/dL Final    Comment:    Delta check noted   06/18/2024 02:22 AM 4.07 (H) 0.61 - 1.24 mg/dL Final  87/74/7974 97:56 AM 3.66 (H) 0.61 - 1.24 mg/dL Final  87/75/7974 98:61 AM 3.17 (H) 0.61 - 1.24 mg/dL Final  87/76/7974 97:62 AM 3.00 (H) 0.61 - 1.24 mg/dL Final  87/77/7974 87:58 PM 3.06 (H) 0.61 - 1.24 mg/dL Final  88/86/7974 89:50 AM 4.39 (H) 0.40 - 1.50 mg/dL Final  88/89/7974 94:74 AM 4.39 (H) 0.61 - 1.24 mg/dL Final  88/90/7974 95:42 AM 4.58 (H) 0.61 - 1.24 mg/dL Final  88/91/7974 95:59 AM 4.69 (H) 0.61 - 1.24 mg/dL Final  88/92/7974 95:74 AM 4.74 (H) 0.61 - 1.24 mg/dL Final  88/93/7974 95:77 AM 4.78 (H) 0.61 - 1.24 mg/dL Final  88/94/7974 93:71 AM 4.72 (H) 0.61 - 1.24 mg/dL Final  88/95/7974 94:64 AM 4.97 (H) 0.61 - 1.24 mg/dL Final  88/96/7974 95:49 PM 4.88 (H) 0.61 - 1.24 mg/dL Final  88/96/7974 94:99 AM 4.82 (H) 0.61 - 1.24 mg/dL Final  88/97/7974 94:52  AM 5.17 (H) 0.61 - 1.24 mg/dL Final  88/98/7974 94:99 AM 5.08 (H) 0.61 - 1.24 mg/dL Final  89/68/7974 95:54 AM 4.82 (H) 0.61 - 1.24 mg/dL Final  89/69/7974 95:95 AM 4.49 (H) 0.61 - 1.24 mg/dL Final  89/70/7974 96:80 AM 4.25 (H) 0.61 - 1.24 mg/dL Final  89/71/7974 95:64 AM 4.00 (H) 0.61 - 1.24 mg/dL Final  89/72/7974 95:52 AM 3.97 (H) 0.61 - 1.24 mg/dL Final  89/74/7974 94:95 PM 3.13 (H) 0.61 - 1.24 mg/dL Final  89/98/7974 95:75 PM 2.47 (H) 0.61 - 1.24 mg/dL Final  90/69/7974 88:61 AM 2.42 (H) 0.40 - 1.50 mg/dL Final  90/74/7974 96:42 AM 2.88 (H) 0.61 - 1.24 mg/dL Final  90/75/7974 94:43 AM 3.05 (H) 0.61 - 1.24 mg/dL Final  90/76/7974 95:63 AM 2.79  (H) 0.61 - 1.24 mg/dL Final  90/77/7974 94:53 AM 3.08 (H) 0.61 - 1.24 mg/dL Final  90/78/7974 96:61 AM 3.16 (H) 0.61 - 1.24 mg/dL Final  90/79/7974 95:81 AM 2.63 (H) 0.61 - 1.24 mg/dL Final  90/80/7974 90:57 AM 2.53 (H) 0.61 - 1.24 mg/dL Final  93/73/7974 90:73 AM 1.46 (H) 0.76 - 1.27 mg/dL Final  89/91/7975 91:48 AM 1.60 (H) 0.76 - 1.27 mg/dL Final  93/74/7975 91:57 AM 1.47 0.40 - 1.50 mg/dL Final  94/77/7975 88:99 AM 1.74 (H) 0.40 - 1.50 mg/dL Final  94/80/7975 96:78 AM 1.29 (H) 0.61 - 1.24 mg/dL Final  94/81/7975 96:88 PM 1.37 (H) 0.61 - 1.24 mg/dL Final  94/81/7975 87:54 AM 1.54 (H) 0.61 - 1.24 mg/dL Final  94/82/7975 89:47 PM 1.80 (H) 0.61 - 1.24 mg/dL Final  94/82/7975 89:48 PM 1.66 (H) 0.61 - 1.24 mg/dL Final  96/72/7976 87:41 PM 1.60 (H) 0.40 - 1.50 mg/dL Final  96/77/7976 89:92 AM 1.75 (H) 0.40 - 1.50 mg/dL Final  96/97/7977 90:65 AM 1.41 0.40 - 1.50 mg/dL Final  98/79/7977 97:50 PM 1.35 (H) 0.76 - 1.27 mg/dL Final  90/98/7978 88:53 AM 1.27 0.76 - 1.27 mg/dL Final  87/96/7979 96:86 PM 1.25 0.40 - 1.50 mg/dL Final  87/73/7980 97:82 PM 1.32 (H) 0.61 - 1.24 mg/dL Final   Recent Labs  Lab 06/15/24 0237 06/16/24 0138 06/17/24 0243 06/18/24 0222 06/18/24 1302 06/19/24 0719 06/19/24 1541 06/20/24 0411  NA 140 137 136 136 138  139 137 137 137  137  K 3.7 4.1 3.7 3.7 3.8  3.8 3.7 3.6 3.6  3.6  CL 101 99 97* 97*  --  100 100 102  102  CO2 26 24 23  19*  --  25 25 27  27   GLUCOSE 76 133* 139* 112*  --  133* 132* 114*  114*  BUN 53* 50* 55* 56*  --  38* 29* 22*  22*  CREATININE 3.00* 3.17* 3.66* 4.07*  --  2.60* 2.11* 1.85*  1.85*  CALCIUM  9.5 9.6 9.5 9.8  --  9.5 9.8 8.9  9.1  PHOS 4.2  --   --   --   --  2.9 2.2* 2.3*   Recent Labs  Lab 06/14/24 1241 06/15/24 0237 06/16/24 0138 06/18/24 1302 06/19/24 0719 06/20/24 0411  WBC 5.4 5.2 5.0  --  5.8 4.6  NEUTROABS 2.9  --   --   --   --   --   HGB 10.1* 9.5* 9.5* 10.9*  10.9* 9.1* 9.2*  HCT 33.3* 30.4* 30.3*  32.0*  32.0* 29.4* 30.0*  MCV 93.0 90.7 88.6  --  91.0 91.2  PLT 433* 378 392  --  349 319   Liver Function  Tests: Recent Labs  Lab 06/14/24 1241 06/15/24 0237 06/19/24 0719 06/19/24 1541 06/20/24 0411  AST 33 27  --   --   --   ALT 40 23  --   --   --   ALKPHOS 152* 135*  --   --   --   BILITOT 0.9 0.8  --   --   --   PROT 7.4 6.5  --   --   --   ALBUMIN  4.1 3.5 3.8 3.9 3.7   No results for input(s): LIPASE, AMYLASE in the last 168 hours. No results for input(s): AMMONIA in the last 168 hours. Cardiac Enzymes: No results for input(s): CKTOTAL, CKMB, CKMBINDEX, TROPONINI in the last 168 hours. Iron  Studies: No results for input(s): IRON , TIBC, TRANSFERRIN, FERRITIN in the last 72 hours. PT/INR: @LABRCNTIP (inr:5)  Xrays/Other Studies: ) Results for orders placed or performed during the hospital encounter of 06/14/24 (from the past 48 hours)  POCT I-Stat EG7     Status: Abnormal   Collection Time: 06/18/24  1:02 PM  Result Value Ref Range   pH, Ven 7.396 7.25 - 7.43   pCO2, Ven 39.1 (L) 44 - 60 mmHg   pO2, Ven 30 (LL) 32 - 45 mmHg   Bicarbonate 24.0 20.0 - 28.0 mmol/L   TCO2 25 22 - 32 mmol/L   O2 Saturation 57 %   Acid-base deficit 1.0 0.0 - 2.0 mmol/L   Sodium 139 135 - 145 mmol/L   Potassium 3.8 3.5 - 5.1 mmol/L   Calcium , Ion 1.17 1.15 - 1.40 mmol/L   HCT 32.0 (L) 39.0 - 52.0 %   Hemoglobin 10.9 (L) 13.0 - 17.0 g/dL   Sample type VENOUS    Comment NOTIFIED PHYSICIAN   POCT I-Stat EG7     Status: Abnormal   Collection Time: 06/18/24  1:02 PM  Result Value Ref Range   pH, Ven 7.384 7.25 - 7.43   pCO2, Ven 39.7 (L) 44 - 60 mmHg   pO2, Ven 30 (LL) 32 - 45 mmHg   Bicarbonate 23.7 20.0 - 28.0 mmol/L   TCO2 25 22 - 32 mmol/L   O2 Saturation 56 %   Acid-base deficit 1.0 0.0 - 2.0 mmol/L   Sodium 138 135 - 145 mmol/L   Potassium 3.8 3.5 - 5.1 mmol/L   Calcium , Ion 1.20 1.15 - 1.40 mmol/L   HCT 32.0 (L) 39.0 - 52.0 %   Hemoglobin 10.9 (L)  13.0 - 17.0 g/dL   Sample type VENOUS    Comment NOTIFIED PHYSICIAN   Glucose, capillary     Status: Abnormal   Collection Time: 06/18/24  3:28 PM  Result Value Ref Range   Glucose-Capillary 145 (H) 70 - 99 mg/dL    Comment: Glucose reference range applies only to samples taken after fasting for at least 8 hours.  MRSA Next Gen by PCR, Nasal     Status: None   Collection Time: 06/18/24  4:11 PM   Specimen: Nasal Mucosa; Nasal Swab  Result Value Ref Range   MRSA by PCR Next Gen NOT DETECTED NOT DETECTED    Comment: (NOTE) The GeneXpert MRSA Assay (FDA approved for NASAL specimens only), is one component of a comprehensive MRSA colonization surveillance program. It is not intended to diagnose MRSA infection nor to guide or monitor treatment for MRSA infections. Test performance is not FDA approved in patients less than 50 years old. Performed at [facility], [address], [place], [place] [address]  Hemoglobin A1c     Status: None   Collection Time: 06/18/24  6:02 PM  Result Value Ref Range   Hgb A1c MFr Bld 5.5 4.8 - 5.6 %    Comment: (NOTE) Diagnosis of Diabetes The following HbA1c ranges recommended by the American Diabetes Association (ADA) may be used as an aid in the diagnosis of diabetes mellitus.  Hemoglobin             Suggested A1C NGSP%              Diagnosis  <5.7                   Non Diabetic  5.7-6.4                Pre-Diabetic  >6.4                   Diabetic  <7.0                   Glycemic control for                       adults with diabetes.     Mean Plasma Glucose 111.15 mg/dL    Comment: Performed at [facility], [address] [place], [place] [address]  Glucose, capillary     Status: Abnormal   Collection Time: 06/18/24  8:42 PM  Result Value Ref Range   Glucose-Capillary 150 (H) 70 - 99 mg/dL    Comment: Glucose reference range applies only to samples taken after fasting for at least 8 hours.  Glucose, capillary     Status:  Abnormal   Collection Time: 06/19/24  6:42 AM  Result Value Ref Range   Glucose-Capillary 118 (H) 70 - 99 mg/dL    Comment: Glucose reference range applies only to samples taken after fasting for at least 8 hours.  CBC     Status: Abnormal   Collection Time: 06/19/24  7:19 AM  Result Value Ref Range   WBC 5.8 4.0 - 10.5 K/uL   RBC 3.23 (L) 4.22 - 5.81 MIL/uL   Hemoglobin 9.1 (L) 13.0 - 17.0 g/dL   HCT 70.5 (L) 60.9 - 47.9 %   MCV 91.0 80.0 - 100.0 fL   MCH 28.2 26.0 - 34.0 pg   MCHC 31.0 30.0 - 36.0 g/dL   RDW 81.9 (H) 88.4 - 84.4 %   Platelets 349 150 - 400 K/uL   nRBC 0.0 0.0 - 0.2 %    Comment: Performed at [facility], [address], [place], [place] [address]  Magnesium      Status: None   Collection Time: 06/19/24  7:19 AM  Result Value Ref Range   Magnesium  2.3 1.7 - 2.4 mg/dL    Comment: Performed at [facility], [address] [place], [place] [address]  Renal function panel (daily at 0500)     Status: Abnormal   Collection Time: 06/19/24  7:19 AM  Result Value Ref Range   Sodium 137 135 - 145 mmol/L   Potassium 3.7 3.5 - 5.1 mmol/L   Chloride 100 98 - 111 mmol/L   CO2 25 22 - 32 mmol/L   Glucose, Bld 133 (H) 70 - 99 mg/dL    Comment: Glucose reference range applies only to samples taken after fasting for at least 8 hours.   BUN 38 (H) 6 - 20 mg/dL   Creatinine, Ser 7.39 (H)  0.61 - 1.24 mg/dL    Comment: Delta check noted    Calcium  9.5 8.9 - 10.3 mg/dL   Phosphorus 2.9 2.5 - 4.6 mg/dL   Albumin  3.8 3.5 - 5.0 g/dL   GFR, Estimated 28 (L) >60 mL/min    Comment: (NOTE) Calculated using the CKD-EPI Creatinine Equation (2021)    Anion gap 12 5 - 15    Comment: Performed at [facility], [address], [place], [place] [address]  Glucose, capillary     Status: Abnormal   Collection Time: 06/19/24 11:10 AM  Result Value Ref Range   Glucose-Capillary 141 (H) 70 - 99 mg/dL    Comment: Glucose reference range applies only to samples taken after  fasting for at least 8 hours.  Cooxemetry Panel (carboxy, met, total hgb, O2 sat)     Status: Abnormal   Collection Time: 06/19/24  1:36 PM  Result Value Ref Range   Total hemoglobin 9.1 (L) 12.0 - 16.0 g/dL   O2 Saturation 46.0 %   Carboxyhemoglobin 1.6 (H) 0.5 - 1.5 %   Methemoglobin <0.7 0.0 - 1.5 %    Comment: Performed at [facility], [address] [place], [place] [address]  Renal function panel (daily at 1600)     Status: Abnormal   Collection Time: 06/19/24  3:41 PM  Result Value Ref Range   Sodium 137 135 - 145 mmol/L   Potassium 3.6 3.5 - 5.1 mmol/L   Chloride 100 98 - 111 mmol/L   CO2 25 22 - 32 mmol/L   Glucose, Bld 132 (H) 70 - 99 mg/dL    Comment: Glucose reference range applies only to samples taken after fasting for at least 8 hours.   BUN 29 (H) 6 - 20 mg/dL   Creatinine, Ser 7.88 (H) 0.61 - 1.24 mg/dL   Calcium  9.8 8.9 - 10.3 mg/dL   Phosphorus 2.2 (L) 2.5 - 4.6 mg/dL   Albumin  3.9 3.5 - 5.0 g/dL   GFR, Estimated 36 (L) >60 mL/min    Comment: (NOTE) Calculated using the CKD-EPI Creatinine Equation (2021)    Anion gap 12 5 - 15    Comment: Performed at [facility], [address], [place], [place] [address]  Glucose, capillary     Status: Abnormal   Collection Time: 06/19/24  4:17 PM  Result Value Ref Range   Glucose-Capillary 130 (H) 70 - 99 mg/dL    Comment: Glucose reference range applies only to samples taken after fasting for at least 8 hours.  Glucose, capillary     Status: Abnormal   Collection Time: 06/19/24  9:29 PM  Result Value Ref Range   Glucose-Capillary 132 (H) 70 - 99 mg/dL    Comment: Glucose reference range applies only to samples taken after fasting for at least 8 hours.  Basic metabolic panel     Status: Abnormal   Collection Time: 06/20/24  4:11 AM  Result Value Ref Range   Sodium 137 135 - 145 mmol/L   Potassium 3.6 3.5 - 5.1 mmol/L   Chloride 102 98 - 111 mmol/L   CO2 27 22 - 32 mmol/L   Glucose, Bld 114 (H) 70 - 99  mg/dL    Comment: Glucose reference range applies only to samples taken after fasting for at least 8 hours.   BUN 22 (H) 6 - 20 mg/dL   Creatinine, Ser 8.14 (H) 0.61 - 1.24 mg/dL   Calcium  8.9 8.9 - 10.3 mg/dL  GFR, Estimated 42 (L) >60 mL/min    Comment: (NOTE) Calculated using the CKD-EPI Creatinine Equation (2021)    Anion gap 8 5 - 15    Comment: Performed at [facility], [address] [place], [place] [address]  CBC     Status: Abnormal   Collection Time: 06/20/24  4:11 AM  Result Value Ref Range   WBC 4.6 4.0 - 10.5 K/uL   RBC 3.29 (L) 4.22 - 5.81 MIL/uL   Hemoglobin 9.2 (L) 13.0 - 17.0 g/dL   HCT 69.9 (L) 60.9 - 47.9 %   MCV 91.2 80.0 - 100.0 fL   MCH 28.0 26.0 - 34.0 pg   MCHC 30.7 30.0 - 36.0 g/dL   RDW 82.3 (H) 88.4 - 84.4 %   Platelets 319 150 - 400 K/uL   nRBC 0.0 0.0 - 0.2 %    Comment: Performed at [facility], [address] [place], [place] [address]  Renal function panel (daily at 0500)     Status: Abnormal   Collection Time: 06/20/24  4:11 AM  Result Value Ref Range   Sodium 137 135 - 145 mmol/L   Potassium 3.6 3.5 - 5.1 mmol/L   Chloride 102 98 - 111 mmol/L   CO2 27 22 - 32 mmol/L   Glucose, Bld 114 (H) 70 - 99 mg/dL    Comment: Glucose reference range applies only to samples taken after fasting for at least 8 hours.   BUN 22 (H) 6 - 20 mg/dL   Creatinine, Ser 8.14 (H) 0.61 - 1.24 mg/dL   Calcium  9.1 8.9 - 10.3 mg/dL   Phosphorus 2.3 (L) 2.5 - 4.6 mg/dL   Albumin  3.7 3.5 - 5.0 g/dL   GFR, Estimated 42 (L) >60 mL/min    Comment: (NOTE) Calculated using the CKD-EPI Creatinine Equation (2021)    Anion gap 8 5 - 15    Comment: Performed at [facility], [address], [place], [place] [address]  Magnesium      Status: Abnormal   Collection Time: 06/20/24  4:11 AM  Result Value Ref Range   Magnesium  2.5 (H) 1.7 - 2.4 mg/dL    Comment: Performed at [facility], [address] [place], [place] [address]  Cooxemetry Panel  (carboxy, met, total hgb, O2 sat)     Status: Abnormal   Collection Time: 06/20/24  4:11 AM  Result Value Ref Range   Total hemoglobin 9.5 (L) 12.0 - 16.0 g/dL   O2 Saturation 37.7 %   Carboxyhemoglobin 1.5 0.5 - 1.5 %   Methemoglobin <0.7 0.0 - 1.5 %    Comment: Performed at [facility], [address], [place], [place] [address]  Glucose, capillary     Status: Abnormal   Collection Time: 06/20/24  6:19 AM  Result Value Ref Range   Glucose-Capillary 110 (H) 70 - 99 mg/dL    Comment: Glucose reference range applies only to samples taken after fasting for at least 8 hours.   CARDIAC CATHETERIZATION Result Date: 06/18/2024 Findings: On milrinone  0.125 mcg/kg/min RA = 19 RV = 49/19 PA = 51/29 (36) PCW = 28 Fick cardiac output/index = 6.2/2.6 Thermo CO/CI 3.7/1.6 PVR = 1.1 (Fick) 1.9 (TD) Ao sat = 95% PA sat = 56%, 57% PAPi = 1.1 Assessment: 1. Markedly elevated filling pressures with low cardiac output by TD 2. Progressive cardiorenal syndrome Plan/Discussion: Move to ICU. Continue inotrope support. Start CRRT. Will need to consider transplant options.  Toribio Fuel, MD 1:45 PM  PERIPHERAL VASCULAR CATHETERIZATION Result Date: 06/18/2024 Findings: On milrinone  0.125 mcg/kg/min RA = 19 RV = 49/19 PA = 51/29 (36) PCW = 28 Fick cardiac output/index = 6.2/2.6 Thermo CO/CI 3.7/1.6 PVR = 1.1 (Fick) 1.9 (TD) Ao sat = 95% PA sat = 56%, 57% PAPi = 1.1 Assessment: 1. Markedly elevated filling pressures with low cardiac output by TD 2. Progressive cardiorenal syndrome Plan/Discussion: Move to ICU. Continue inotrope support. Start CRRT. Will need to consider transplant options. Toribio Fuel, MD 1:45 PM   PMH:   Past Medical History:  Diagnosis Date   Blood in stool    bright red blood    Chicken pox    Diabetes mellitus (HCC)    Erectile dysfunction    Gout    Hypertension    Stroke [facility])     PSH:   Past Surgical History:  Procedure Laterality Date   APPENDECTOMY  2004    COLONOSCOPY     COLONOSCOPY N/A 04/22/2024   Procedure: COLONOSCOPY;  Surgeon: Stacia Glendia BRAVO, MD;  Location: [facility] ENDOSCOPY;  Service: Gastroenterology;  Laterality: N/A;   ENDOMYOCARDIAL BIOPSY N/A 03/16/2024   Procedure: ENDOMYOCARDIAL BIOPSY;  Surgeon: Zenaida Morene PARAS, MD;  Location: [facility] INVASIVE CV LAB;  Service: Cardiovascular;  Laterality: N/A;   ESOPHAGOGASTRODUODENOSCOPY N/A 04/19/2024   Procedure: EGD (ESOPHAGOGASTRODUODENOSCOPY);  Surgeon: Stacia Glendia BRAVO, MD;  Location: [facility] ENDOSCOPY;  Service: Gastroenterology;  Laterality: N/A;   IR THORACENTESIS ASP PLEURAL SPACE W/IMG GUIDE  06/14/2024   RIGHT HEART CATH N/A 03/16/2024   Procedure: RIGHT HEART CATH;  Surgeon: Zenaida Morene PARAS, MD;  Location: [facility] INVASIVE CV LAB;  Service: Cardiovascular;  Laterality: N/A;   RIGHT HEART CATH N/A 04/23/2024   Procedure: RIGHT HEART CATH;  Surgeon: Rolan Ezra RAMAN, MD;  Location: [facility] INVASIVE CV LAB;  Service: Cardiovascular;  Laterality: N/A;   TOOTH EXTRACTION  07/06/2020    Allergies: Allergies[1]  Medications:   Prior to Admission medications  Medication Sig Start Date End Date Taking? Authorizing Provider  buPROPion  (WELLBUTRIN  XL) 300 MG 24 hr tablet TAKE 1 TABLET BY MOUTH ONCE  DAILY 04/13/24  Yes Nafziger, Darleene, NP  colchicine  0.6 MG tablet TAKE 1 TABLET BY MOUTH DAILY  WHEN HAVING FLARES TAKE 1 TABLET BY MOUTH TWICE DAILY 09/05/23  Yes Nafziger, Darleene, NP  ELIQUIS  5 MG TABS tablet TAKE 1 TABLET BY MOUTH TWICE  DAILY 04/27/24  Yes West, Katlyn D, NP  hydrOXYzine  (VISTARIL ) 50 MG capsule Take 1 capsule (50 mg total) by mouth every 8 (eight) hours as needed. 03/23/24  Yes Nafziger, Darleene, NP  midodrine  (PROAMATINE ) 5 MG tablet Take 3 tablets (15 mg total) by mouth 3 (three) times daily with meals. 05/03/24  Yes Tobie Yetta HERO, MD  Multiple Vitamins-Minerals (ONE-A-DAY MENS 50+) TABS Take 1 tablet by mouth daily with breakfast.   Yes [provider]  oxybutynin  (DITROPAN ) 5 MG  tablet Take 1 tablet (5 mg total) by mouth every 8 (eight) hours as needed for bladder spasms. 05/18/24  Yes Rolan Ezra RAMAN, MD  pantoprazole  (PROTONIX ) 40 MG tablet Take 1 tablet (40 mg total) by mouth daily. 05/18/24  Yes Rolan Ezra RAMAN, MD  potassium chloride  SA (KLOR-CON  M) 20 MEQ tablet Take 2 tablets (40 mEq total) by mouth 2 (two) times daily. 05/18/24  Yes Rolan Ezra RAMAN, MD  torsemide  (DEMADEX ) 20 MG tablet Take 3 tablets (60 mg total) by mouth 2 (two) times daily. 05/03/24  Yes Patel, Pranav  M, MD  triamcinolone  cream (KENALOG ) 0.5 % Apply 1 Application topically as needed (Irritation).   Yes [provider]    Discontinued Meds:   Medications Discontinued During This Encounter  Medication Reason   Insulin  Pen Needle (B-D UF III MINI PEN NEEDLES) 31G X 5 MM MISC    Lancets (ONETOUCH ULTRASOFT) lancets    glucose blood (ONETOUCH ULTRA TEST) test strip    blood glucose meter kit and supplies    famotidine  (PEPCID ) 20 MG tablet Patient Preference   magnesium  oxide (MAG-OX) 400 (240 Mg) MG tablet Patient Preference   rosuvastatin  (CRESTOR ) 20 MG tablet Patient Preference   thiamine  (VITAMIN B-1) 100 MG tablet Patient Preference   furosemide  (LASIX ) 120 mg in dextrose  5 % 50 mL IVPB    furosemide  (LASIX ) injection 120 mg    furosemide  (LASIX ) 120 mg in dextrose  5 % 50 mL IVPB    furosemide  (LASIX ) 120 mg in dextrose  5 % 50 mL IVPB    predniSONE  (DELTASONE ) tablet 20 mg    furosemide  (LASIX ) 160 mg in dextrose  5 % 50 mL IVPB    sodium chloride  flush (NS) 0.9 % injection 3 mL    sodium chloride  flush (NS) 0.9 % injection 3 mL    rosuvastatin  (CRESTOR ) tablet 20 mg    colchicine  tablet 0.6 mg    sodium chloride  flush (NS) 0.9 % injection 3 mL Patient Transfer   sodium chloride  flush (NS) 0.9 % injection 3 mL Patient Transfer   0.9 %  sodium chloride  infusion Patient Transfer   Heparin  (Porcine) in NaCl 1000-0.9 UT/500ML-% SOLN Patient Transfer   fentaNYL  (SUBLIMAZE )  injection Patient Transfer   midazolam  PF (VERSED ) injection Patient Transfer   lidocaine  (PF) (XYLOCAINE ) 1 % injection Patient Transfer   midodrine  (PROAMATINE ) tablet 15 mg    norepinephrine  (LEVOPHED ) 4mg  in (0.016 mg/mL) premix infusion    colchicine  tablet 0.6 mg     Social History:  reports that he quit smoking about 3 months ago. His smoking use included cigarettes and cigars. He has never used smokeless tobacco. He reports that he does not currently use alcohol. He reports that he does not currently use drugs after having used the following drugs: Marijuana.  Family History:   Family History  Problem Relation Age of Onset   Alcohol abuse Father    Hypertension Father    Heart disease Father    Gout Father    Breast cancer Mother    Hypertension Mother    Gout Mother    Healthy Sister    Healthy Daughter    Colon cancer Neg Hx    Esophageal cancer Neg Hx    Rectal cancer Neg Hx    Stomach cancer Neg Hx     Blood pressure (!) 76/64, pulse 85, temperature 98.2 F (36.8 C), resp. rate 20, height 6' 5 (1.956 m), weight 98.7 kg, SpO2 92%. Physical Exam: General exam: Appears calm and comfortable, NAD, pleasant Respiratory system: CTA b/l, no rales Cardiovascular system: S1 & S2 heard, RRR.   Gastrointestinal system: Abdomen is nondistended, soft and nontender. Normal bowel sounds  Central nervous system: Alert and oriented. No focal neurological deficits. Extremities: LE edema present tr-1+ Skin: No rashes, lesions or ulcers     Janica Eldred, LYNWOOD ORN, MD 06/20/2024, 11:21 AM      [1] No Known Allergies

## 2024-06-20 NOTE — Progress Notes (Signed)
 Patient ID: Keith Leblanc, male   DOB: 10/09/1965, 58 y.o.   MRN: [id number]     Advanced Heart Failure Rounding Note  Cardiologist: None  AHF Cardiologist: Dr. Zenaida Chief Complaint: End stage combined systolic and diastolic HF/ SOB Patient Profile   Keith Leblanc is a 58 y.o. male with end stage diastolic heart failure, former heavy drinker, type 2DM, HTN, CKD IIIa, HLD, history of stroke, and PAF on eliquis . Admitted with SOB s/p thoracentesis.    Significant events:   12/22: Admitted w SOB. S/p thoracentesis 1.3L 12/27: RHC and CRRT started  Subjective:    Now on CRRT pulling -100. Weight down 9 pounds  CVP 10 today.  He remains on milrinone  0.125.   Swan: CVP 10 PA 46/31 CI 2.0  Co-ox 62%  RHC 12/26 On milrinone  0.125 mcg/kg/min RA = 19 RV = 49/19 PA = 51/29 (36) PCW = 28 Fick cardiac output/index = 6.2/2.6 Thermo CO/CI 3.7/1.6 PVR = 1.1 (Fick) 1.9 (TD) Ao sat = 95% PA sat = 56%, 57% PAPi = 1.1   Objective:    Weight Range: 98.7 kg Body mass index is 25.8 kg/m.   Vital Signs:   Temp:  [97.7 F (36.5 C)-99 F (37.2 C)] 98.2 F (36.8 C) (12/28 0900) Pulse Rate:  [73-99] 85 (12/28 0900) Resp:  [10-25] 20 (12/28 0900) BP: (76-120)/(64-88) 76/64 (12/28 0900) SpO2:  [85 %-100 %] 92 % (12/28 0900) Weight:  [98.7 kg] 98.7 kg (12/28 0500) Last BM Date : 06/19/24  Weight change: Filed Weights   06/18/24 0604 06/19/24 0600 06/20/24 0500  Weight: 103.1 kg 102.7 kg 98.7 kg   Intake/Output:  Intake/Output Summary (Last 24 hours) at 06/20/2024 1042 Last data filed at 06/20/2024 0900 Gross per 24 hour  Intake 1083.4 ml  Output 4570.8 ml  Net -3487.4 ml    Physical Exam   General: NAD Neck: JVP 10 cm, no thyromegaly or thyroid  nodule.  Lungs: Clear to auscultation bilaterally with normal respiratory effort. CV: Nondisplaced PMI.  Heart irregular S1/S2, no S3/S4, no murmur.  No peripheral edema.   Abdomen: Soft, nontender, no hepatosplenomegaly, no  distention.  Skin: Intact without lesions or rashes.  Neurologic: Alert and oriented x 3.  Psych: Normal affect. Extremities: No clubbing or cyanosis.  HEENT: Normal.   Telemetry   AF 80s (personally reviewed)  Labs   CBC Recent Labs    06/19/24 0719 06/20/24 0411  WBC 5.8 4.6  HGB 9.1* 9.2*  HCT 29.4* 30.0*  MCV 91.0 91.2  PLT 349 319   Basic Metabolic Panel Recent Labs    87/72/74 0719 06/19/24 1541 06/20/24 0411  NA 137 137 137  137  K 3.7 3.6 3.6  3.6  CL 100 100 102  102  CO2 25 25 27  27   GLUCOSE 133* 132* 114*  114*  BUN 38* 29* 22*  22*  CREATININE 2.60* 2.11* 1.85*  1.85*  CALCIUM  9.5 9.8 8.9  9.1  MG 2.3  --  2.5*  PHOS 2.9 2.2* 2.3*   Liver Function Tests Recent Labs    06/19/24 1541 06/20/24 0411  ALBUMIN  3.9 3.7    BNP (last 3 results) Recent Labs    03/24/24 1624 04/17/24 1704  BNP 1,093.8* 1,324.0*   ProBNP (last 3 results) Recent Labs    03/12/24 0942 06/14/24 1241  PROBNP 5,364.0* 8,130.0*   Medications:   Scheduled Medications:  allopurinol   50 mg Oral Daily   apixaban   5 mg Oral BID  buPROPion   300 mg Oral Daily   Chlorhexidine  Gluconate Cloth  6 each Topical Daily   docusate sodium   100 mg Oral BID   famotidine   20 mg Oral Daily   insulin  aspart  1-3 Units Subcutaneous TID AC & HS   multivitamin with minerals  1 tablet Oral Daily   pantoprazole   40 mg Oral Daily   rosuvastatin   10 mg Oral Daily   senna  1 tablet Oral BID   sodium chloride  flush  10-40 mL Intracatheter Q12H    Infusions:  milrinone  0.125 mcg/kg/min (06/20/24 0900)   potassium PHOSPHATE  IVPB (in mmol) 43 mL/hr at 06/20/24 0900   prismasol  BGK 4/2.5 400 mL/hr at 06/20/24 0748   prismasol  BGK 4/2.5 400 mL/hr at 06/20/24 0748   prismasol  BGK 4/2.5 1,500 mL/hr at 06/20/24 0748    PRN Medications: acetaminophen  **OR** acetaminophen , heparin , melatonin, ondansetron  **OR** ondansetron  (ZOFRAN ) IV, oxybutynin , sodium chloride , sodium chloride   flush  Assessment/Plan   1. End stage combined acute on chronic systolic and diastolic heart failurer: Primarily restrictive cardiomyopathy with biventricular involvement. Severely reduced VO2, TD index. Workup included CMR, biopsy without evidence of cardiac amyloid. Biopsy with hypertrophic nuclei, genetic testing ordered previously.  Suspect most likely a form of hypertrophic cardiomyopathy.  - Echo 10/25 EF 40% RV moderately down  - [facility] referral for txp workup has been sent - NYHA IV on admission.  - Failed to diurese well on milrinone  and IV lasix .  - RHC as above - CRRT started on 12/26. Weight down 9 lbs yesterday, CVP 10 today.  Would continue CVVH today, pulling net negative 100 cc/hr UF.  May be able to transition to [facility] after today.  - Hemodynamics remain marginal. Continue milrinone  0.125 for now.  - Suspect HD need may be permanent and will need to see if he can tolerate off inotropes.  - Will need to consider candidacy for heart/kidney transplant in future.  Has appt at [facility] 1/5 but do not think he will be out of the hospital at that point. He is O pos - Repeat limited echo today.   Chronic AF - Rate controlled - Duration unknown. Was in AF on all ECGs dating back to 10/24 (was in sinus in 10/24) - Continue Eliquis    Acute on CKD stage IV - Baseline SCr 3 - CRT started 12/26 - plan as above, suspect HD will be permanent.   Pleural effusion - s/p tap this admit  6. Gout - pain in hands - uric acid 17.6 - colchicine  per primary => does not want to take.  - completed prednisone  burst - continue allopurinol  50 daily  CRITICAL CARE Performed by: Ezra Shuck  Total critical care time: 40 minutes  Critical care time was exclusive of separately billable procedures and treating other patients.  Critical care was necessary to treat or prevent imminent or life-threatening deterioration.  Critical care was time spent personally by me on the following activities:  development of treatment plan with patient and/or surrogate as well as nursing, discussions with consultants, evaluation of patient's response to treatment, examination of patient, obtaining history from patient or surrogate, ordering and performing treatments and interventions, ordering and review of laboratory studies, ordering and review of radiographic studies, pulse oximetry and re-evaluation of patient's condition.   Length of Stay: 6  Ezra Shuck, MD  06/20/2024, 10:42 AM  Advanced Heart Failure Team Pager [phone] (M-F; 7a - 5p)   Please visit [email or web address]: For overnight coverage please call cardiology fellow first.  If fellow not available call Shock/ECMO MD on call.  For ECMO / Mechanical Support (Impella, IABP, LVAD) issues call Shock / ECMO MD on call.

## 2024-06-21 ENCOUNTER — Encounter (HOSPITAL_COMMUNITY): Payer: Self-pay | Admitting: Internal Medicine

## 2024-06-21 ENCOUNTER — Ambulatory Visit: Admitting: Student in an Organized Health Care Education/Training Program

## 2024-06-21 DIAGNOSIS — I5023 Acute on chronic systolic (congestive) heart failure: Secondary | ICD-10-CM | POA: Diagnosis not present

## 2024-06-21 DIAGNOSIS — E1122 Type 2 diabetes mellitus with diabetic chronic kidney disease: Secondary | ICD-10-CM

## 2024-06-21 DIAGNOSIS — I425 Other restrictive cardiomyopathy: Secondary | ICD-10-CM

## 2024-06-21 DIAGNOSIS — I130 Hypertensive heart and chronic kidney disease with heart failure and stage 1 through stage 4 chronic kidney disease, or unspecified chronic kidney disease: Secondary | ICD-10-CM

## 2024-06-21 LAB — RENAL FUNCTION PANEL
Albumin: 3.9 g/dL (ref 3.5–5.0)
Albumin: 3.9 g/dL (ref 3.5–5.0)
Anion gap: 11 (ref 5–15)
Anion gap: 9 (ref 5–15)
BUN: 14 mg/dL (ref 6–20)
BUN: 14 mg/dL (ref 6–20)
CO2: 24 mmol/L (ref 22–32)
CO2: 25 mmol/L (ref 22–32)
Calcium: 9.3 mg/dL (ref 8.9–10.3)
Calcium: 9.3 mg/dL (ref 8.9–10.3)
Chloride: 100 mmol/L (ref 98–111)
Chloride: 100 mmol/L (ref 98–111)
Creatinine, Ser: 1.74 mg/dL — ABNORMAL HIGH (ref 0.61–1.24)
Creatinine, Ser: 1.74 mg/dL — ABNORMAL HIGH (ref 0.61–1.24)
GFR, Estimated: 45 mL/min — ABNORMAL LOW
GFR, Estimated: 45 mL/min — ABNORMAL LOW
Glucose, Bld: 113 mg/dL — ABNORMAL HIGH (ref 70–99)
Glucose, Bld: 118 mg/dL — ABNORMAL HIGH (ref 70–99)
Phosphorus: 2.4 mg/dL — ABNORMAL LOW (ref 2.5–4.6)
Phosphorus: 3.1 mg/dL (ref 2.5–4.6)
Potassium: 3.8 mmol/L (ref 3.5–5.1)
Potassium: 4.3 mmol/L (ref 3.5–5.1)
Sodium: 135 mmol/L (ref 135–145)
Sodium: 135 mmol/L (ref 135–145)

## 2024-06-21 LAB — BASIC METABOLIC PANEL WITH GFR
Anion gap: 11 (ref 5–15)
BUN: 14 mg/dL (ref 6–20)
CO2: 25 mmol/L (ref 22–32)
Calcium: 9.1 mg/dL (ref 8.9–10.3)
Chloride: 101 mmol/L (ref 98–111)
Creatinine, Ser: 1.69 mg/dL — ABNORMAL HIGH (ref 0.61–1.24)
GFR, Estimated: 47 mL/min — ABNORMAL LOW
Glucose, Bld: 113 mg/dL — ABNORMAL HIGH (ref 70–99)
Potassium: 3.9 mmol/L (ref 3.5–5.1)
Sodium: 137 mmol/L (ref 135–145)

## 2024-06-21 LAB — CBC
HCT: 31.7 % — ABNORMAL LOW (ref 39.0–52.0)
Hemoglobin: 9.7 g/dL — ABNORMAL LOW (ref 13.0–17.0)
MCH: 27.8 pg (ref 26.0–34.0)
MCHC: 30.6 g/dL (ref 30.0–36.0)
MCV: 90.8 fL (ref 80.0–100.0)
Platelets: 312 K/uL (ref 150–400)
RBC: 3.49 MIL/uL — ABNORMAL LOW (ref 4.22–5.81)
RDW: 17.6 % — ABNORMAL HIGH (ref 11.5–15.5)
WBC: 4.7 K/uL (ref 4.0–10.5)
nRBC: 0 % (ref 0.0–0.2)

## 2024-06-21 LAB — COOXEMETRY PANEL
Carboxyhemoglobin: 1.4 % (ref 0.5–1.5)
Carboxyhemoglobin: 1.8 % — ABNORMAL HIGH (ref 0.5–1.5)
Methemoglobin: 1.1 % (ref 0.0–1.5)
Methemoglobin: 0.7 % (ref 0.0–1.5)
O2 Saturation: 59.2 %
O2 Saturation: 53.2 %
Total hemoglobin: 10 g/dL — ABNORMAL LOW (ref 12.0–16.0)
Total hemoglobin: 10.3 g/dL — ABNORMAL LOW (ref 12.0–16.0)

## 2024-06-21 LAB — GLUCOSE, CAPILLARY
Glucose-Capillary: 139 mg/dL — ABNORMAL HIGH (ref 70–99)
Glucose-Capillary: 124 mg/dL — ABNORMAL HIGH (ref 70–99)
Glucose-Capillary: 121 mg/dL — ABNORMAL HIGH (ref 70–99)
Glucose-Capillary: 170 mg/dL — ABNORMAL HIGH (ref 70–99)
Glucose-Capillary: 118 mg/dL — ABNORMAL HIGH (ref 70–99)

## 2024-06-21 LAB — MAGNESIUM: Magnesium: 2.5 mg/dL — ABNORMAL HIGH (ref 1.7–2.4)

## 2024-06-21 MED ORDER — NOREPINEPHRINE 4 MG/250ML-% IV SOLN: 0.0000 ug/min | INTRAVENOUS | Status: DC

## 2024-06-21 MED ORDER — DOBUTAMINE-DEXTROSE 4-5 MG/ML-% IV SOLN
5.0000 ug/kg/min | INTRAVENOUS | Status: DC
  Administered 2024-06-21: 2.5 ug/kg/min via INTRAVENOUS
  Administered 2024-06-23: 5 ug/kg/min via INTRAVENOUS
  Filled 2024-06-21 (×2): qty 250

## 2024-06-21 MED ORDER — QUETIAPINE FUMARATE 50 MG PO TABS
50.0000 mg | ORAL_TABLET | Freq: Every day | ORAL | Status: DC
  Administered 2024-06-21 – 2024-06-22 (×2): 50 mg via ORAL
  Filled 2024-06-21 (×2): qty 1

## 2024-06-21 MED ORDER — TRAZODONE HCL 50 MG PO TABS
100.0000 mg | ORAL_TABLET | Freq: Every day | ORAL | Status: DC
  Administered 2024-06-21 – 2024-06-22 (×2): 100 mg via ORAL
  Filled 2024-06-21 (×2): qty 2

## 2024-06-21 MED ORDER — ORAL CARE MOUTH RINSE: 15.0000 mL | OROMUCOSAL | Status: DC | PRN

## 2024-06-21 MED ORDER — POTASSIUM PHOSPHATES 15 MMOLE/5ML IV SOLN
15.0000 mmol | Freq: Once | INTRAVENOUS | Status: AC
  Administered 2024-06-21: 15 mmol via INTRAVENOUS
  Filled 2024-06-21: qty 5

## 2024-06-21 MED FILL — Heparin Sodium (Porcine) Inj 1000 Unit/ML: INTRAMUSCULAR | Qty: 10 | Status: AC

## 2024-06-21 NOTE — Progress Notes (Signed)
 "  NAME:  Keith Leblanc, MRN:  [id number], DOB:  [birth year], LOS: 7 ADMISSION DATE:  06/14/2024, CONSULTATION DATE:  06/18/24 REFERRING MD:  Noralee HERO, CHIEF COMPLAINT:  dyspnea x2 weeks  History of Present Illness:  Keith Leblanc is a 58 yo male with past medical history significant for end-stage diastolic heart failure, Esophagitis/gastritis, former ETOH abuse, DM2, HTN, CKD IV, prior CVA, PAF on Eliquis  who presented 12/22 with 2-weeks dyspnea, worsening heart failure. CXR showed right pleural effusion and patient underwent R thoracentesis with 1.3L removed. Heart failure following and started on milrinone /lasix  with minimal response, planned for RHC today. PCCM consulted for ICU admission post-cath.  Patient recently hospitalized here at [facility] 04/17/24-05/03/2024 for acute on chronic decompensated heart failure s/p RHC with elevated filling pressures, reduced index, LHC deferred d/t renal function, and was treated with inotropes and diuretics.  Pertinent Medical History:   Past Medical History:  Diagnosis Date   Blood in stool    bright red blood    Chicken pox    Diabetes mellitus (HCC)    Erectile dysfunction    Gout    Hypertension    Stroke (HCC)    Significant Hospital Events: Including procedures, antibiotic start and stop dates in addition to other pertinent events   12/22 Admit TRH worsening heart failure; started on milrinone /lasix ; CXR right pleural effusion s/p thoracentesis with 1.3L removed 12/23-12/25 minimal response to inotropes/lasix >worsening renal function-lasix  gtt stopped 12/26: ICU post-RHC  Interim History / Subjective:  Having difficulty sleeping overnight/getting comfortable   Alert, oriented x 4, sitting up in the chair   On milrinone  at 0.125 mcg/min   Coox 59.2    Objective    Blood pressure 114/77, pulse 82, temperature 98.2 F (36.8 C), resp. rate 20, height 6' 5 (1.956 m), weight 96 kg, SpO2 98%. PAP: (20-56)/(3-41) 20/12 CVP:  [0 mmHg-29  mmHg] 0 mmHg PCWP:  [15 mmHg-16 mmHg] 16 mmHg CO:  [4 L/min-7 L/min] 7 L/min CI:  [1.7 L/min/m2-3 L/min/m2] 3 L/min/m2      Intake/Output Summary (Last 24 hours) at 06/21/2024 0720 Last data filed at 06/21/2024 0600 Gross per 24 hour  Intake 1042.11 ml  Output 4115.8 ml  Net -3073.69 ml   Filed Weights   06/19/24 0600 06/20/24 0500 06/21/24 0500  Weight: 102.7 kg 98.7 kg 96 kg    I/O -3.5L, net -7.1L for admission    Examination: General: acute on chronic older adult male, sitting up in chair HEENT: Normocephalic, PERRLA intact, Pink MM CV: s1,s2, afib no MRG, No JVD, CVP 4-6 pulm: clear, diminished, no distress Abs: bs active, soft  Extremities: no edema, no deformity, moves all extremities on command Skin: no rash  Neuro: Rass  0, follows commands GU: deferred   CVP 4-6   Coox 59.2 mcg on milrinone  0.151mcg  BUN 14, Cr 1.74   Na 135 K 3.8 Phos 2.4 Mag 2.5   I/O last 24 hours- net negative 3.073.7 L  Since admit -10.2   Resolved Problem List:   Assessment and Plan:   End stage acute on chronic HFrEF Restrictive cardiomyopathy w/ biventricular involvement Cardiorenal syndrome Hypotension Previously referred to [facility] for transplant evaluation-appointment supposedly 06/28/24 Failed Lasix  challenge on CRRT P: On CRRT to assist with fluid removal in setting of heart failure, advanced heart failure and nephrology following, appreciate assistance Continue CRRT Hold off GDMT, on hold due to renal failure/shock status Continue milrinone  for now, per heart failure, Coox-59.2 Continue to trend to cooximetry, CVP  daily Continue MAP goal greater than 65, currently on no vasopressors at this time Mobilize, PT consult   Anemia likely multifactorial in setting of hemodilution d/t ADHF with prior GI blood loss Hemoglobin 9.7 < 9.2 <9.1 P: Continue to monitor for signs of bleeding Transfuse for hemoglobin less than 7 Continue to monitor CBC  daily  AKI on CKD IV secondary to cardiorenal syndrome Hypophosphatemia-2.4 P: Continue to trend renal function daily  Continue to monitor and optimize electrolytes daily Continue to monitor urine output Continue strict I/Os Continue Adequate renal perfusion  Avoid nephrotoxic agents  Nephrology following, appreciate assistance Replace phosphorus- recommend K,phos   Chronic AFib-on apixaban  A-fib rate controlled, not on any meds to assist with rate P: Continue cardiac telemetry Continue apixaban   Right pleural effusion, s/p thoracentesis with 1.3L removed on 12/22 P: Continue to monitor respiratory status continue Currently on room air Continue CRRT for fluid removal  Gout-right hand P: Continue allopurinol   Hx gastritis & esophagitis, EGD 10/27 for ABLA; friable gastric mucosa Diverticulosis (dx 2019) P: Continue Protonix  and Pepcid   Persistent left eye floaters since prior hospitalization P: Follow-up outpatient in 2026  Hyperglycemia P: Continue sliding scale insulin  Continue CBG goal 140-180  Insomnia P: Continue melatonin Continue trazodone    Labs:  CBC: Recent Labs  Lab 06/14/24 1241 06/15/24 0237 06/16/24 0138 06/18/24 1302 06/19/24 0719 06/20/24 0411 06/21/24 0454  WBC 5.4 5.2 5.0  --  5.8 4.6 4.7  NEUTROABS 2.9  --   --   --   --   --   --   HGB 10.1* 9.5* 9.5* 10.9*  10.9* 9.1* 9.2* 9.7*  HCT 33.3* 30.4* 30.3* 32.0*  32.0* 29.4* 30.0* 31.7*  MCV 93.0 90.7 88.6  --  91.0 91.2 90.8  PLT 433* 378 392  --  349 319 312    Basic Metabolic Panel: Recent Labs  Lab 06/15/24 0237 06/16/24 0138 06/17/24 0243 06/18/24 0222 06/19/24 0719 06/19/24 1541 06/20/24 0411 06/20/24 1600 06/21/24 0454  NA 140   < > 136   < > 137 137 137  137 136 135  137  K 3.7   < > 3.7   < > 3.7 3.6 3.6  3.6 3.5 3.8  3.9  CL 101   < > 97*   < > 100 100 102  102 104 100  101  CO2 26   < > 23   < > 25 25 27  27 23 24  25   GLUCOSE 76   < > 139*   < >  133* 132* 114*  114* 102* 113*  113*  BUN 53*   < > 55*   < > 38* 29* 22*  22* 15 14  14   CREATININE 3.00*   < > 3.66*   < > 2.60* 2.11* 1.85*  1.85* 1.51* 1.74*  1.69*  CALCIUM  9.5   < > 9.5   < > 9.5 9.8 8.9  9.1 8.4* 9.3  9.1  MG 2.1  --  2.1  --  2.3  --  2.5*  --  2.5*  PHOS 4.2  --   --   --  2.9 2.2* 2.3* 2.2* 2.4*   < > = values in this interval not displayed.   GFR: Estimated Creatinine Clearance: 61.5 mL/min (A) (by C-G formula based on SCr of 1.69 mg/dL (H)). Recent Labs  Lab 06/16/24 0138 06/19/24 0719 06/20/24 0411 06/21/24 0454  WBC 5.0 5.8 4.6 4.7    Critical care time:  55 mins     Christian Kelsei Defino AGACNP-BC   [facility] 06/21/2024, 7:58 AM  Please see [email or web address] for pager details.  From 7A-7P if no response, please call [phone]. After hours, please call ELink [phone].    "

## 2024-06-21 NOTE — TOC Initial Note (Signed)
 Transition of Care [facility]) - Initial/Assessment Note    Patient Details  Name: Keith Leblanc MRN: [id number] Date of Birth: Apr 25, 1966  Transition of Care [facility]) CM/SW Contact:    Justina Delcia Czar, RN Phone Number: [phone] 06/21/2024, 12:47 PM  Clinical Narrative:                  Patient currently lives alone, Works full-time. Has Living Better with HF booklet and scale at home for daily weights.   Chart reviewed for discharge readiness, patient not medically stable for d/c. Inpatient CM/CSW will continue to monitor pt's advancement through interdisciplinary progression rounds.   If new pt transition needs arise, MD please place a TOC consult.     Expected Discharge Plan: Acute to Acute Transfer Barriers to Discharge: Continued Medical Work up   Patient Goals and CMS Choice       Expected Discharge Plan and Services   Discharge Planning Services: CM Consult   Living arrangements for the past 2 months: Single Family Home                    Prior Living Arrangements/Services Living arrangements for the past 2 months: Single Family Home Lives with:: Self Patient language and need for interpreter reviewed:: Yes Do you feel safe going back to the place where you live?: Yes      Need for Family Participation in Patient Care: No (Comment) Care giver support system in place?: No (comment) Current home services: DME (scale) Criminal Activity/Legal Involvement Pertinent to Current Situation/Hospitalization: No - Comment as needed  Activities of Daily Living   ADL Screening (condition at time of admission) Independently performs ADLs?: Yes (appropriate for developmental age) Is the patient deaf or have difficulty hearing?: No Does the patient have difficulty seeing, even when wearing glasses/contacts?: No Does the patient have difficulty concentrating, remembering, or making decisions?: No  Permission Sought/Granted Permission sought to share information with :  Case Manager, Family Supports, PCP Permission granted to share information with : Yes, Verbal Permission Granted  Share Information with NAME: Orel Cooler  Permission granted to share info w AGENCY: PCP, DME  Permission granted to share info w Relationship: daughter  Permission granted to share info w Contact Information: [phone]  Emotional Assessment              Admission diagnosis:  Depression, recurrent [F33.9] PAF (paroxysmal atrial fibrillation) (HCC) [I48.0] Pleural effusion [J90] Acute on chronic systolic CHF (congestive heart failure), NYHA class 3 (HCC) [I50.23] Dyspnea, unspecified type [R06.00] Patient Active Problem List   Diagnosis Date Noted   Cardiorenal syndrome 06/18/2024   CKD (chronic kidney disease) stage 5, GFR less than 15 ml/min (HCC) 06/18/2024   Acute on chronic HFrEF (heart failure with reduced ejection fraction) (HCC) 06/18/2024   Acute on chronic systolic CHF (congestive heart failure) (HCC) 06/14/2024   Iron  deficiency anemia, unspecified 04/26/2024   Benign neoplasm of transverse colon 04/22/2024   Systolic heart failure (HCC) 04/21/2024   Acute on chronic combined systolic and diastolic CHF (congestive heart failure) (HCC) 04/20/2024   Chronic kidney disease (CKD), stage IV (severe) (HCC) 04/20/2024   Gastritis and gastroduodenitis 04/19/2024   Acute on chronic blood loss anemia 04/18/2024   Hematochezia 04/18/2024   GI bleed 04/17/2024   Biventricular congestive heart failure (HCC) 03/13/2024   Acute on chronic diastolic CHF (congestive heart failure) (HCC) 03/12/2024   Alcohol use disorder 03/12/2024   Marijuana abuse 03/12/2024   Controlled diabetes mellitus (HCC)  03/12/2024   Acute renal failure superimposed on stage 4 chronic kidney disease (HCC) 03/12/2024   Paroxysmal A-fib (HCC) 03/12/2024   Dyslipidemia 03/12/2024   Slurred speech 11/09/2022   Stroke (cerebrum) (HCC) 11/09/2022   Hypertriglyceridemia 05/04/2018   Class 1  drug-induced obesity with serious comorbidity and body mass index (BMI) of 32.0 to 32.9 in adult 01/21/2017   Type 2 diabetes mellitus with hyperglycemia (HCC) 08/27/2016   Protein-calorie malnutrition, severe 07/08/2016   Diabetic ketoacidosis without coma associated with type 2 diabetes mellitus (HCC) 07/07/2016   AKI (acute kidney injury) 07/07/2016   Hyponatremia 07/07/2016   Hyperkalemia 07/07/2016   Obesity (BMI 30-39.9) 07/07/2016   Tobacco abuse 07/07/2016   Overweight (BMI 25.0-29.9)    Melena 05/21/2016   Essential hypertension 04/28/2015   Erectile dysfunction 04/28/2015   Gout 04/28/2015   PCP:  [name], NP Pharmacy:   [facility] - [place], [place] - [id number] [address] [place] [address] Phone: [phone] Fax: [phone]  [facility] [address] [place] - [address] AT [facility] [address] & [address] [place] [address] Phone: [phone] Fax: [phone]     Social Drivers of Health (SDOH) Social History: SDOH Screenings   Food Insecurity: No Food Insecurity (06/14/2024)  Housing: Low Risk (06/14/2024)  Transportation Needs: No Transportation Needs (06/14/2024)  Utilities: Not At Risk (06/14/2024)  Depression (PHQ2-9): Low Risk (03/23/2024)  Social Connections: Moderately Integrated (06/14/2024)  Tobacco Use: Medium Risk (06/18/2024)   SDOH Interventions:     Readmission Risk Interventions    03/15/2024   12:35 PM  Readmission Risk Prevention Plan  Transportation Screening Complete  HRI or Home Care Consult Complete  Social Work Consult for Recovery Care Planning/Counseling Complete  Palliative Care Screening Not Applicable  Medication Review Oceanographer) Referral to Pharmacy

## 2024-06-21 NOTE — Progress Notes (Signed)
 "    Advanced Heart Failure Rounding Note  Cardiologist: None  AHF Cardiologist: Dr. Zenaida Chief Complaint: End stage combined systolic and diastolic HF/ SOB Patient Profile   Keith Leblanc is a 58 y.o. male with end stage diastolic heart failure, former heavy drinker, type 2DM, HTN, CKD IIIa, HLD, history of stroke, and PAF on eliquis . Admitted with SOB s/p thoracentesis.    Significant events:   12/22: Admitted w SOB. S/p thoracentesis 1.3L 12/27: RHC on milrinone  and CRRT started. RA 19, PA 51/29 (36), PCW 28, CO/CI 3.7/1.6, PAPi 1.1  Subjective:    Overnight CI 1.7 on milrinone  0.125. Net negative 3.3L on CRRT. Weight down 6 lbs. BP soft.   [place]wan: PAP: (19-68)/(3-41) 29/16 CVP:  [0 mmHg-34 mmHg] 7 mmHg PCWP:  [15 mmHg-16 mmHg] 16 mmHg CO:  [4 L/min-7 L/min] 7 L/min CI:  [1.7 L/min/m2-3 L/min/m2] 3 L/min/m2 Co-ox 59%  Sitting up in chair. Feeling well. No SOB, fatigue improving.   Objective:    Weight Range: 96 kg Body mass index is 25.1 kg/m.   Vital Signs:   Temp:  [97.9 F (36.6 C)-98.8 F (37.1 C)] 98.2 F (36.8 C) (12/29 0800) Pulse Rate:  [74-107] 79 (12/29 0800) Resp:  [10-25] 16 (12/29 0800) BP: (76-118)/(52-86) 93/76 (12/29 0800) SpO2:  [92 %-100 %] 99 % (12/29 0800) Weight:  [96 kg] 96 kg (12/29 0500) Last BM Date : 06/19/24  Weight change: Filed Weights   06/19/24 0600 06/20/24 0500 06/21/24 0500  Weight: 102.7 kg 98.7 kg 96 kg   Intake/Output:  Intake/Output Summary (Last 24 hours) at 06/21/2024 0813 Last data filed at 06/21/2024 0700 Gross per 24 hour  Intake 1042.11 ml  Output 4099.8 ml  Net -3057.69 ml    Physical Exam   General: Well appearing. No distress  Cardiac: JVP flat. No murmurs  Extremities: Warm and dry.  No peripheral edema.  Neuro: A&O x3. Affect pleasant.   Telemetry   AF 90s (personally reviewed)  Labs   CBC Recent Labs    06/20/24 0411 06/21/24 0454  WBC 4.6 4.7  HGB 9.2* 9.7*  HCT 30.0* 31.7*  MCV  91.2 90.8  PLT 319 312   Basic Metabolic Panel Recent Labs    87/71/74 0411 06/20/24 1600 06/21/24 0454  NA 137  137 136 135  137  K 3.6  3.6 3.5 3.8  3.9  CL 102  102 104 100  101  CO2 27  27 23 24  25   GLUCOSE 114*  114* 102* 113*  113*  BUN 22*  22* 15 14  14   CREATININE 1.85*  1.85* 1.51* 1.74*  1.69*  CALCIUM  8.9  9.1 8.4* 9.3  9.1  MG 2.5*  --  2.5*  PHOS 2.3* 2.2* 2.4*   Liver Function Tests Recent Labs    06/20/24 1600 06/21/24 0454  ALBUMIN  3.6 3.9    BNP (last 3 results) Recent Labs    03/24/24 1624 04/17/24 1704  BNP 1,093.8* 1,324.0*   ProBNP (last 3 results) Recent Labs    03/12/24 0942 06/14/24 1241  PROBNP 5,364.0* 8,130.0*   Medications:   Scheduled Medications:  allopurinol   50 mg Oral Daily   apixaban   5 mg Oral BID   buPROPion   300 mg Oral Daily   Chlorhexidine  Gluconate Cloth  6 each Topical Daily   docusate sodium   100 mg Oral BID   famotidine   10 mg Oral Daily   insulin  aspart  1-3 Units Subcutaneous TID AC &  HS   multivitamin with minerals  1 tablet Oral Daily   pantoprazole   40 mg Oral Daily   rosuvastatin   10 mg Oral Daily   senna  1 tablet Oral BID   sodium chloride  flush  10-40 mL Intracatheter Q12H   traZODone   50 mg Oral QHS    Infusions:  milrinone  0.125 mcg/kg/min (06/21/24 0700)   prismasol  BGK 4/2.5 400 mL/hr at 06/20/24 2117   prismasol  BGK 4/2.5 400 mL/hr at 06/20/24 2045   prismasol  BGK 4/2.5 1,500 mL/hr at 06/21/24 0753    PRN Medications: acetaminophen  **OR** acetaminophen , heparin , melatonin, ondansetron  **OR** ondansetron  (ZOFRAN ) IV, oxybutynin , sodium chloride , sodium chloride  flush  Assessment/Plan   1. End stage combined acute on chronic systolic and diastolic heart failurer: Primarily restrictive cardiomyopathy with biventricular involvement. Severely reduced VO2, TD index. Workup included CMR, biopsy without evidence of cardiac amyloid. Biopsy with hypertrophic nuclei, genetic testing  ordered previously.  Suspect most likely a form of hypertrophic cardiomyopathy.  - Echo 10/25 EF 40% RV moderately down  - Repeat echo 12/25 EF 30-35%, moderately reduced RV. RHC as above - Repeat ltd echo 06/20/24: EF 30-35%, mod reduced RV function - [facility] referral for txp workup has been sent, scheduled for appt 06/29/23 - NYHA IV on admission. Failed to diurese well on milrinone  and IV lasix   - CRRT started on 12/26. Weight down 20 lbs. CVP low.  - With acute renal failure and hypotension. Stop milrinone . Start DBA 2.5 - Decrease UF to 0-50/hr with low CVP. Plan to stop CRRT today. Will need to trial iHD soon.  - Hemodynamics remain marginal. Switch milrinone  to DBA with renal failure and hypotension - Suspect HD need may be permanent and will need to see if he can tolerate off inotropes.  - Considering candidacy for heart/kidney transplant.  Has appt at [facility] 1/5 but do not think he will be out of the hospital at that point. He is O+. If unable to tolerate iHD, likely will need to transfer to [facility] for inpatient work up.   2. Chronic AF - Rate controlled - Duration unknown. Was in AF on all ECGs dating back to 10/24 (was in sinus in 10/24) - Continue Eliquis    3. Acute on CKD stage IV - Baseline SCr 3. Had previously discussed PD in outpatient.  - CRRT started 12/26 - plan as above, suspect HD will be permanent.   4. Pleural effusion - s/p tap this admit  5. Gout - in R hand, improved.  - uric acid 17.6 s/p colchicine  and prednisone   - continue allopurinol  50 daily  CRITICAL CARE Performed by: Katelee Schupp  Total critical care time: 8 minutes  -Critical care time was exclusive of separately billable procedures and treating other patients. -Critical care was necessary to treat or prevent imminent or life-threatening deterioration. -Critical care was time spent personally by me on the following activities: development of treatment plan with patient and/or surrogate as well as  nursing, discussions with consultants, evaluation of patient's response to treatment, examination of patient, obtaining history from patient or surrogate, ordering and performing treatments and interventions, ordering and review of laboratory studies, ordering and review of radiographic studies, pulse oximetry and re-evaluation of patient's condition.  Length of Stay: 7  Glendale Wherry, NP  06/21/2024, 8:13 AM  Advanced Heart Failure Team Pager [phone] (M-F; 7a - 5p)   Please visit [email or web address]: For overnight coverage please call cardiology fellow first. If fellow not available call Shock/ECMO MD on call.  For  ECMO / Mechanical Support (Impella, IABP, LVAD) issues call Shock / ECMO MD on call.    "

## 2024-06-21 NOTE — Progress Notes (Addendum)
 Admit: 06/14/2024 LOS: 7  82M AoC HFrEF and dialysis dependent AoCKD4 on CRRT  Current CRRT Prescription: Start Date: 12/26 Catheter: Right IJ temp HD catheter BFR: 300 Pre Blood Pump: 400 4K DFR: 1500 4K Replacement Rate: 400 4K Goal UF: Net even Anticoagulation: None Clotting: Infrequent  S: Patient seen while on CRRT, sitting in chair, tolerating Remains on milrinone  Mixed venous O2 59.2 today K3.8, phosphorus 2.4 -3.2 L yesterday, -10.3 L altogether  O: 12/28 0701 - 12/29 0700 In: 1046 [P.O.:620; I.V.:92.7; IV Piggyback:273.2] Out: 4275.9 [Urine:200]  Filed Weights   06/19/24 0600 06/20/24 0500 06/21/24 0500  Weight: 102.7 kg 98.7 kg 96 kg    Recent Labs  Lab 06/20/24 0411 06/20/24 1600 06/21/24 0454  NA 137  137 136 135  137  K 3.6  3.6 3.5 3.8  3.9  CL 102  102 104 100  101  CO2 27  27 23 24  25   GLUCOSE 114*  114* 102* 113*  113*  BUN 22*  22* 15 14  14   CREATININE 1.85*  1.85* 1.51* 1.74*  1.69*  CALCIUM  8.9  9.1 8.4* 9.3  9.1  PHOS 2.3* 2.2* 2.4*   Recent Labs  Lab 06/14/24 1241 06/15/24 0237 06/19/24 0719 06/20/24 0411 06/21/24 0454  WBC 5.4   < > 5.8 4.6 4.7  NEUTROABS 2.9  --   --   --   --   HGB 10.1*   < > 9.1* 9.2* 9.7*  HCT 33.3*   < > 29.4* 30.0* 31.7*  MCV 93.0   < > 91.0 91.2 90.8  PLT 433*   < > 349 319 312   < > = values in this interval not displayed.    Scheduled Meds:  allopurinol   50 mg Oral Daily   apixaban   5 mg Oral BID   buPROPion   300 mg Oral Daily   Chlorhexidine  Gluconate Cloth  6 each Topical Daily   docusate sodium   100 mg Oral BID   famotidine   10 mg Oral Daily   insulin  aspart  1-3 Units Subcutaneous TID AC & HS   multivitamin with minerals  1 tablet Oral Daily   pantoprazole   40 mg Oral Daily   rosuvastatin   10 mg Oral Daily   senna  1 tablet Oral BID   sodium chloride  flush  10-40 mL Intracatheter Q12H   traZODone   50 mg Oral QHS   Continuous Infusions:  DOBUTamine  2.5 mcg/kg/min  (06/21/24 0839)   potassium PHOSPHATE  IVPB (in mmol)     prismasol  BGK 4/2.5 400 mL/hr at 06/20/24 2117   prismasol  BGK 4/2.5 400 mL/hr at 06/20/24 2045   prismasol  BGK 4/2.5 1,500 mL/hr at 06/21/24 0753   PRN Meds:.acetaminophen  **OR** acetaminophen , heparin , melatonin, ondansetron  **OR** ondansetron  (ZOFRAN ) IV, oxybutynin , sodium chloride , sodium chloride  flush  ABG    Component Value Date/Time   PHART 7.255 (L) 07/07/2016 1235   PCO2ART 26.9 (L) 07/07/2016 1235   PO2ART 89.4 07/07/2016 1235   HCO3 24.0 06/18/2024 1302   HCO3 23.7 06/18/2024 1302   TCO2 25 06/18/2024 1302   TCO2 25 06/18/2024 1302   ACIDBASEDEF 1.0 06/18/2024 1302   ACIDBASEDEF 1.0 06/18/2024 1302   O2SAT 59.2 06/21/2024 0454    A Dialysis dependent AKI on CKD 4 from cardiorenal syndrome Acute on chronic HFrEF on milrinone  with upcoming appointments for heart/kidney transplant at [facility] Chronic hypotension #2 Mild hypophosphatemia related to CRRT Mild anemia, hemoglobin 9.7  P Continue CRRT at current settings, 4K, apixaban   Will need to attempt to transition to intermittent hemodialysis this week, anticipate will be dialysis dependent until hold for cardiac transplant and will need a kidney as well Can consider PD transition, which was his outpatient preference, depending on what timeframe these things occur  Assessment and plan discussed with AHF MD and CCM MD  Bernardino Gasman, MD [facility]

## 2024-06-22 LAB — RENAL FUNCTION PANEL
Albumin: 3.7 g/dL (ref 3.5–5.0)
Albumin: 3.9 g/dL (ref 3.5–5.0)
Anion gap: 10 (ref 5–15)
Anion gap: 10 (ref 5–15)
BUN: 18 mg/dL (ref 6–20)
BUN: 23 mg/dL — ABNORMAL HIGH (ref 6–20)
CO2: 25 mmol/L (ref 22–32)
CO2: 25 mmol/L (ref 22–32)
Calcium: 9.5 mg/dL (ref 8.9–10.3)
Calcium: 9.1 mg/dL (ref 8.9–10.3)
Chloride: 101 mmol/L (ref 98–111)
Chloride: 102 mmol/L (ref 98–111)
Creatinine, Ser: 2.51 mg/dL — ABNORMAL HIGH (ref 0.61–1.24)
Creatinine, Ser: 3.34 mg/dL — ABNORMAL HIGH (ref 0.61–1.24)
GFR, Estimated: 29 mL/min — ABNORMAL LOW
GFR, Estimated: 21 mL/min — ABNORMAL LOW
Glucose, Bld: 92 mg/dL (ref 70–99)
Glucose, Bld: 183 mg/dL — ABNORMAL HIGH (ref 70–99)
Phosphorus: 3.7 mg/dL (ref 2.5–4.6)
Phosphorus: 4.2 mg/dL (ref 2.5–4.6)
Potassium: 4 mmol/L (ref 3.5–5.1)
Potassium: 4.2 mmol/L (ref 3.5–5.1)
Sodium: 136 mmol/L (ref 135–145)
Sodium: 137 mmol/L (ref 135–145)

## 2024-06-22 LAB — COOXEMETRY PANEL
Carboxyhemoglobin: 1.1 % (ref 0.5–1.5)
Carboxyhemoglobin: 1.4 % (ref 0.5–1.5)
Methemoglobin: 1.1 % (ref 0.0–1.5)
Methemoglobin: <0.7 % (ref 0.0–1.5)
O2 Saturation: 51.5 %
O2 Saturation: 62.9 %
Total hemoglobin: 10 g/dL — ABNORMAL LOW (ref 12.0–16.0)
Total hemoglobin: 10.1 g/dL — ABNORMAL LOW (ref 12.0–16.0)

## 2024-06-22 LAB — CBC
HCT: 31.5 % — ABNORMAL LOW (ref 39.0–52.0)
Hemoglobin: 9.5 g/dL — ABNORMAL LOW (ref 13.0–17.0)
MCH: 27.5 pg (ref 26.0–34.0)
MCHC: 30.2 g/dL (ref 30.0–36.0)
MCV: 91 fL (ref 80.0–100.0)
Platelets: 260 K/uL (ref 150–400)
RBC: 3.46 MIL/uL — ABNORMAL LOW (ref 4.22–5.81)
RDW: 17.3 % — ABNORMAL HIGH (ref 11.5–15.5)
WBC: 4.5 K/uL (ref 4.0–10.5)
nRBC: 0 % (ref 0.0–0.2)

## 2024-06-22 LAB — HEPATITIS B SURFACE ANTIGEN: Hepatitis B Surface Ag: NONREACTIVE

## 2024-06-22 LAB — GLUCOSE, CAPILLARY
Glucose-Capillary: 103 mg/dL — ABNORMAL HIGH (ref 70–99)
Glucose-Capillary: 129 mg/dL — ABNORMAL HIGH (ref 70–99)
Glucose-Capillary: 136 mg/dL — ABNORMAL HIGH (ref 70–99)
Glucose-Capillary: 103 mg/dL — ABNORMAL HIGH (ref 70–99)

## 2024-06-22 LAB — MAGNESIUM: Magnesium: 2.5 mg/dL — ABNORMAL HIGH (ref 1.7–2.4)

## 2024-06-22 MED ORDER — MIDODRINE HCL 5 MG PO TABS
5.0000 mg | ORAL_TABLET | Freq: Three times a day (TID) | ORAL | Status: DC
  Administered 2024-06-22 (×2): 5 mg via ORAL
  Administered 2024-06-23: 10 mg via ORAL
  Filled 2024-06-22 (×2): qty 1

## 2024-06-22 NOTE — Plan of Care (Signed)
  Problem: Education: Goal: Knowledge of General Education information will improve Description: Including pain rating scale, medication(s)/side effects and non-pharmacologic comfort measures Outcome: Progressing   Problem: Clinical Measurements: Goal: Will remain free from infection Outcome: Progressing   Problem: Activity: Goal: Risk for activity intolerance will decrease Outcome: Progressing   Problem: Coping: Goal: Level of anxiety will decrease Outcome: Progressing   Problem: Elimination: Goal: Will not experience complications related to bowel motility Outcome: Progressing   

## 2024-06-22 NOTE — TOC Progression Note (Addendum)
 Transition of Care [facility]) - Progression Note    Patient Details  Name: Keith Leblanc MRN: [id number] Date of Birth: 28-Nov-1965  [facility] CM/SW Contact  Justina Delcia Czar, RN Phone Number: [phone] 06/22/2024, 1:09 PM  Clinical Narrative:    Spoke to pt and SO, Alicia at bedside. Pt states he is agreeable to Home Dobutamine  if not transferred to [facility].   Contacted [employer] rep, Pam for Home Dobutamine . Will need HH orders.  [employer] can provided [facility] with Home Dobutamine .   Expected Discharge Plan: Acute to Acute Transfer Barriers to Discharge: Continued Medical Work up   Expected Discharge Plan and Services   Discharge Planning Services: CM Consult   Living arrangements for the past 2 months: Single Family Home                     Social Drivers of Health (SDOH) Interventions SDOH Screenings   Food Insecurity: No Food Insecurity (06/14/2024)  Housing: Low Risk (06/14/2024)  Transportation Needs: No Transportation Needs (06/14/2024)  Utilities: Not At Risk (06/14/2024)  Depression (PHQ2-9): Low Risk (03/23/2024)  Social Connections: Moderately Integrated (06/14/2024)  Tobacco Use: Medium Risk (06/18/2024)    Readmission Risk Interventions    03/15/2024   12:35 PM  Readmission Risk Prevention Plan  Transportation Screening Complete  HRI or Home Care Consult Complete  Social Work Consult for Recovery Care Planning/Counseling Complete  Palliative Care Screening Not Applicable  Medication Review Oceanographer) Referral to Pharmacy

## 2024-06-22 NOTE — Progress Notes (Signed)
 "  NAME:  Keith Leblanc, MRN:  [id number], DOB:  1966-01-18, LOS: 8 ADMISSION DATE:  06/14/2024, CONSULTATION DATE:  06/18/24 REFERRING MD:  Noralee HERO, CHIEF COMPLAINT:  dyspnea x2 weeks  History of Present Illness:  Keith Leblanc is a 58 yo male with past medical history significant for end-stage diastolic heart failure, Esophagitis/gastritis, former ETOH abuse, DM2, HTN, CKD IV, prior CVA, PAF on Eliquis  who presented 12/22 with 2-weeks dyspnea, worsening heart failure. CXR showed right pleural effusion and patient underwent R thoracentesis with 1.3L removed. Heart failure following and started on milrinone /lasix  with minimal response, planned for RHC today. PCCM consulted for ICU admission post-cath.  Patient recently hospitalized here at [facility] 04/17/24-05/03/2024 for acute on chronic decompensated heart failure s/p RHC with elevated filling pressures, reduced index, LHC deferred d/t renal function, and was treated with inotropes and diuretics.  Pertinent Medical History:   Past Medical History:  Diagnosis Date   Blood in stool    bright red blood    Chicken pox    Diabetes mellitus (HCC)    Erectile dysfunction    Gout    Hypertension    Stroke (HCC)    Significant Hospital Events: Including procedures, antibiotic start and stop dates in addition to other pertinent events   12/22 Admit TRH worsening heart failure; started on milrinone /lasix ; CXR right pleural effusion s/p thoracentesis with 1.3L removed 12/23-12/25 minimal response to inotropes/lasix >worsening renal function-lasix  gtt stopped 12/26: ICU post-RHC 12/29: Transitioned off CRRT, plan for IHD today, milrinone  transitioned to dobutamine   12/30 On dobutamine , plan for IHD in next day or so, had more sleep overnight   Interim History / Subjective:  No complaints overnight, had better night of sleep   Coox 51.2   Off CRRT   On Dobutamine  2.5  CVP-11   Objective    Blood pressure 106/71, pulse 79, temperature  98.6 F (37 C), resp. rate 18, height 6' 5 (1.956 m), weight 95.8 kg, SpO2 100%. PAP: (12-38)/(-3-19) 22/19 CVP:  [2 mmHg-8 mmHg] 4 mmHg CO:  [4.9 L/min] 4.9 L/min CI:  [2.1 L/min/m2] 2.1 L/min/m2      Intake/Output Summary (Last 24 hours) at 06/22/2024 0727 Last data filed at 06/22/2024 0600 Gross per 24 hour  Intake 677.95 ml  Output 1010 ml  Net -332.05 ml   Filed Weights   06/20/24 0500 06/21/24 0500 06/22/24 0500  Weight: 98.7 kg 96 kg 95.8 kg    net -10.1 L for admission    Examination General: acute on chronic adult male, sitting up in ICU bed in NAD HEENT: Normocephalic, PERRLA intact, Pink MM CV: s1,s2, RRR, no MRG, No JVD  pulm: clear, diminished, no distress Abs: bs active, soft  Extremities: no edema, no deformity, moves all extremities on command  Skin: no rash  Neuro: Rass  0 GU: deferred    CVP 10 to 11   Coox 51.2   BUN 14, Cr 1.74 > 18, 2.51   Na 135 > 136  K 3.8 > 4.0  Phos 2.4 > 3.7  Mag 2.5 > 2.5    Resolved Problem List:   Assessment and Plan:   End stage acute on chronic HFrEF Restrictive cardiomyopathy w/ biventricular involvement Cardiorenal syndrome Hypotension Previously referred to [facility] for transplant evaluation-appointment supposedly 06/28/24 Failed Lasix  challenge on CRRT 12/29 Transitioned off CRRT, plan for iHD in next day or so  P: Plan for iHD per nephro, appreciate there assistance  GDMT limited due to renal failure/shock status  Milrinone   stopped on 12/29- transitioned to 2.5 mcg of dobutamine  due to soft BP and ESRD Continue to trend Coox and CVP daily  Continue to mobilize, PT  Continue 2 gram sodium diet   Anemia likely multifactorial in setting of hemodilution d/t ADHF with prior GI blood loss Hemoglobin 9.5 < 9.7 < 9.2 <9.1 P: Continue to monitor for signs of bleeding Continue check CBC daily, with h/h  Transfuse for hgb < 7   AKI on CKD IV secondary to cardiorenal  syndrome Hypophosphatemia-resolved for now since CRRT stopped  P: Nephro following, appreciate assistance, plan for iHD in next couple days  Continue to trend renal function daily  Continue to monitor and optimize electrolytes daily Continue to monitor urine output Continue strict I/Os Continue Adequate renal perfusion  Avoid nephrotoxic agents  Continue 2 gram sodium diet   Chronic AFib-on apixaban  A-fib rate controlled, not on any meds to assist with rate P: Continue cardiac tele Continue apixaban    Right pleural effusion, s/p thoracentesis with 1.3L removed on 12/22 P: Continue to monitor respiratory status, currently stable  Continue to achieve euvolemia, net negative fluid balance   Gout-right hand P: Continue allopurinol    Hx gastritis & esophagitis, EGD 10/27 for ABLA; friable gastric mucosa Diverticulosis (dx 2019) P: Continue protonix  and pepcid    Persistent left eye floaters since prior hospitalization P: Follow up outpatient   Hyperglycemia P: Continue SSI Continue CBG goal 140-180   Insomnia P: Improved overnight  Dc melatonin on 12/29- increased trazadone 100mg  and added seroquel  50mg  HS    Labs:  CBC: Recent Labs  Lab 06/16/24 0138 06/18/24 1302 06/19/24 0719 06/20/24 0411 06/21/24 0454 06/22/24 0420  WBC 5.0  --  5.8 4.6 4.7 4.5  HGB 9.5* 10.9*  10.9* 9.1* 9.2* 9.7* 9.5*  HCT 30.3* 32.0*  32.0* 29.4* 30.0* 31.7* 31.5*  MCV 88.6  --  91.0 91.2 90.8 91.0  PLT 392  --  349 319 312 260    Basic Metabolic Panel: Recent Labs  Lab 06/17/24 0243 06/18/24 0222 06/19/24 0719 06/19/24 1541 06/20/24 0411 06/20/24 1600 06/21/24 0454 06/21/24 1456 06/22/24 0420  NA 136   < > 137   < > 137  137 136 135  137 135 136  K 3.7   < > 3.7   < > 3.6  3.6 3.5 3.8  3.9 4.3 4.0  CL 97*   < > 100   < > 102  102 104 100  101 100 101  CO2 23   < > 25   < > 27  27 23 24  25 25 25   GLUCOSE 139*   < > 133*   < > 114*  114* 102* 113*  113*  118* 92  BUN 55*   < > 38*   < > 22*  22* 15 14  14 14 18   CREATININE 3.66*   < > 2.60*   < > 1.85*  1.85* 1.51* 1.74*  1.69* 1.74* 2.51*  CALCIUM  9.5   < > 9.5   < > 8.9  9.1 8.4* 9.3  9.1 9.3 9.5  MG 2.1  --  2.3  --  2.5*  --  2.5*  --  2.5*  PHOS  --   --  2.9   < > 2.3* 2.2* 2.4* 3.1 3.7   < > = values in this interval not displayed.   GFR: Estimated Creatinine Clearance: 41.4 mL/min (A) (by C-G formula based on SCr of 2.51 mg/dL (H)).  Recent Labs  Lab 06/19/24 0719 06/20/24 0411 06/21/24 0454 06/22/24 0420  WBC 5.8 4.6 4.7 4.5    Critical care time:  45 mins     Christian Temitayo Covalt AGACNP-BC   [facility] 06/22/2024, 7:27 AM  Please see [email or web address] for pager details.  From 7A-7P if no response, please call [phone]. After hours, please call ELink [phone].    "

## 2024-06-22 NOTE — Progress Notes (Signed)
 Admit: 06/14/2024 LOS: 8  14M AoC HFrEF and dialysis dependent AoCKD4   S: CRRT stopped yesterday remains on dobutamine  No complaints or interval events Not much UOP K4.0, BUN 18, creatinine 2.5  O: 12/29 0701 - 12/30 0700 In: 681.6 [P.O.:340; I.V.:86.5; IV Piggyback:255.1] Out: 1010 [Urine:200]  Filed Weights   06/20/24 0500 06/21/24 0500 06/22/24 0500  Weight: 98.7 kg 96 kg 95.8 kg    Recent Labs  Lab 06/21/24 0454 06/21/24 1456 06/22/24 0420  NA 135  137 135 136  K 3.8  3.9 4.3 4.0  CL 100  101 100 101  CO2 24  25 25 25   GLUCOSE 113*  113* 118* 92  BUN 14  14 14 18   CREATININE 1.74*  1.69* 1.74* 2.51*  CALCIUM  9.3  9.1 9.3 9.5  PHOS 2.4* 3.1 3.7   Recent Labs  Lab 06/20/24 0411 06/21/24 0454 06/22/24 0420  WBC 4.6 4.7 4.5  HGB 9.2* 9.7* 9.5*  HCT 30.0* 31.7* 31.5*  MCV 91.2 90.8 91.0  PLT 319 312 260    Scheduled Meds:  allopurinol   50 mg Oral Daily   apixaban   5 mg Oral BID   buPROPion   300 mg Oral Daily   Chlorhexidine  Gluconate Cloth  6 each Topical Daily   docusate sodium   100 mg Oral BID   famotidine   10 mg Oral Daily   insulin  aspart  1-3 Units Subcutaneous TID AC & HS   midodrine   5 mg Oral TID WC   multivitamin with minerals  1 tablet Oral Daily   pantoprazole   40 mg Oral Daily   QUEtiapine   50 mg Oral QHS   rosuvastatin   10 mg Oral Daily   senna  1 tablet Oral BID   sodium chloride  flush  10-40 mL Intracatheter Q12H   traZODone   100 mg Oral QHS   Continuous Infusions:  DOBUTamine  5 mcg/kg/min (06/22/24 1100)   prismasol  BGK 4/2.5 400 mL/hr at 06/21/24 0935   prismasol  BGK 4/2.5 400 mL/hr at 06/21/24 0935   prismasol  BGK 4/2.5 1,500 mL/hr at 06/21/24 1451   PRN Meds:.acetaminophen  **OR** acetaminophen , heparin , ondansetron  **OR** ondansetron  (ZOFRAN ) IV, mouth rinse, oxybutynin , sodium chloride , sodium chloride  flush  ABG    Component Value Date/Time   PHART 7.255 (L) 07/07/2016 1235   PCO2ART 26.9 (L) 07/07/2016 1235    PO2ART 89.4 07/07/2016 1235   HCO3 24.0 06/18/2024 1302   HCO3 23.7 06/18/2024 1302   TCO2 25 06/18/2024 1302   TCO2 25 06/18/2024 1302   ACIDBASEDEF 1.0 06/18/2024 1302   ACIDBASEDEF 1.0 06/18/2024 1302   O2SAT 62.9 06/22/2024 1005    A Dialysis dependent AKI on CKD 4 from cardiorenal syndrome Started CRRT 12/26, transitioned off 12/29 Using right IJ temp HD catheter Acute on chronic HFrEF on inotropic support with upcoming appointments for heart/kidney transplant at [facility] Chronic hypotension #2, midodrine , not on pressors Mild hypophosphatemia; resolved, CTM Mild anemia, hemoglobin 9s, CTM  P Plan for attempted intermittent hemodialysis tomorrow, earlier on the day: 3h, 1 to 2L UF, tight heparin  Can consider PD transition, which was his outpatient preference, depending on what timeframe these things occur  Assessment and plan discussed with AHF service  and primary RN  Bernardino Gasman, MD [facility]

## 2024-06-22 NOTE — Progress Notes (Signed)
 "    Advanced Heart Failure Rounding Note  Cardiologist: None  AHF Cardiologist: Dr. Zenaida Chief Complaint: End stage combined systolic and diastolic HF/ SOB Patient Profile   Keith Leblanc is a 58 y.o. male with end stage diastolic heart failure, former heavy drinker, type 2DM, HTN, CKD IIIa, HLD, history of stroke, and PAF on eliquis . Admitted with SOB s/p thoracentesis.    Significant events:   12/22: Admitted w SOB. S/p thoracentesis 1.3L 12/27: RHC on milrinone  and CRRT started. RA 19, PA 51/29 (36), PCW 28, CO/CI 3.7/1.6, PAPi 1.1 12/29: Stopped CRRT.  Subjective:    Had soff BP overnight SBP 60-80s. Co-ox 51.5% on DBA 2.5. Off CRRT, 200cc UOP. Net even, weight stable.  Swan: PAP: (12-38)/(-3-19) 22/19 CVP:  [0 mmHg-8 mmHg] 4 mmHg CO:  [4.9 L/min] 4.9 L/min CI:  [2.1 L/min/m2] 2.1 L/min/m2  Sitting up in bed. Feels ok. Nervous. No SOB, although has not been exerting himself.   Objective:    Weight Range: 95.8 kg Body mass index is 25.03 kg/m.   Vital Signs:   Temp:  [97.7 F (36.5 C)-99 F (37.2 C)] 98.6 F (37 C) (12/30 0700) Pulse Rate:  [64-99] 79 (12/30 0700) Resp:  [10-23] 18 (12/30 0700) BP: (64-106)/(48-80) 106/71 (12/30 0700) SpO2:  [91 %-100 %] 100 % (12/30 0700) Weight:  [95.8 kg] 95.8 kg (12/30 0500) Last BM Date : 06/21/24  Weight change: Filed Weights   06/20/24 0500 06/21/24 0500 06/22/24 0500  Weight: 98.7 kg 96 kg 95.8 kg   Intake/Output:  Intake/Output Summary (Last 24 hours) at 06/22/2024 9287 Last data filed at 06/22/2024 0600 Gross per 24 hour  Intake 677.95 ml  Output 1010 ml  Net -332.05 ml    Physical Exam   General: Well appearing. No distress  Cardiac: JVP flat. No murmurs  Extremities: Warm and dry.  No peripheral edema.  Neuro: A&O x3. Affect pleasant.   Telemetry   AF 80s (personally reviewed)  Labs   CBC Recent Labs    06/21/24 0454 06/22/24 0420  WBC 4.7 4.5  HGB 9.7* 9.5*  HCT 31.7* 31.5*  MCV  90.8 91.0  PLT 312 260   Basic Metabolic Panel Recent Labs    87/70/74 0454 06/21/24 1456 06/22/24 0420  NA 135  137 135 136  K 3.8  3.9 4.3 4.0  CL 100  101 100 101  CO2 24  25 25 25   GLUCOSE 113*  113* 118* 92  BUN 14  14 14 18   CREATININE 1.74*  1.69* 1.74* 2.51*  CALCIUM  9.3  9.1 9.3 9.5  MG 2.5*  --  2.5*  PHOS 2.4* 3.1 3.7   Liver Function Tests Recent Labs    06/21/24 1456 06/22/24 0420  ALBUMIN  3.9 3.7    BNP (last 3 results) Recent Labs    03/24/24 1624 04/17/24 1704  BNP 1,093.8* 1,324.0*   ProBNP (last 3 results) Recent Labs    03/12/24 0942 06/14/24 1241  PROBNP 5,364.0* 8,130.0*   Medications:   Scheduled Medications:  allopurinol   50 mg Oral Daily   apixaban   5 mg Oral BID   buPROPion   300 mg Oral Daily   Chlorhexidine  Gluconate Cloth  6 each Topical Daily   docusate sodium   100 mg Oral BID   famotidine   10 mg Oral Daily   insulin  aspart  1-3 Units Subcutaneous TID AC & HS   multivitamin with minerals  1 tablet Oral Daily   pantoprazole   40 mg Oral  Daily   QUEtiapine   50 mg Oral QHS   rosuvastatin   10 mg Oral Daily   senna  1 tablet Oral BID   sodium chloride  flush  10-40 mL Intracatheter Q12H   traZODone   100 mg Oral QHS    Infusions:  DOBUTamine  2.5 mcg/kg/min (06/22/24 0600)   prismasol  BGK 4/2.5 400 mL/hr at 06/21/24 0935   prismasol  BGK 4/2.5 400 mL/hr at 06/21/24 0935   prismasol  BGK 4/2.5 1,500 mL/hr at 06/21/24 1451    PRN Medications: acetaminophen  **OR** acetaminophen , heparin , ondansetron  **OR** ondansetron  (ZOFRAN ) IV, mouth rinse, oxybutynin , sodium chloride , sodium chloride  flush  Assessment/Plan   1. End stage combined acute on chronic systolic and diastolic heart failurer: Primarily restrictive cardiomyopathy with biventricular involvement. Severely reduced VO2, TD index. Workup included CMR, biopsy without evidence of cardiac amyloid. Biopsy with hypertrophic nuclei, genetic testing ordered previously.   Suspect most likely a form of hypertrophic cardiomyopathy.  - Echo 10/25 EF 40% RV moderately down  - Repeat echo 12/25 EF 30-35%, moderately reduced RV. RHC as above - Repeat ltd echo 06/20/24: EF 30-35%, mod reduced RV function - [facility] referral for txp workup has been sent, scheduled for appt 06/29/23 - NYHA IV on admission. Failed to diurese well on milrinone  and IV lasix   - CRRT started on 12/26. Weight down 20 lbs. CVP low.  - With acute renal failure and hypotension. Milrinone  switched to DBA - Co-ox 52%, increase DBA to 5 - Add midodrine  5 mg tid with hypotension - CRRT stopped 12/29. D/w nephrology, will plan to trial iHD tomorrow. - Suspect HD need may be permanent and will need to see if he can tolerate off inotropes.  - Considering candidacy for heart/kidney transplant.  Has appt at [facility] 1/5 but do not think he will be out of the hospital at that point. He is O+. If unable to tolerate iHD, likely will need to transfer to [facility] for inpatient work up.   2. Chronic AF - Rate controlled - Duration unknown. Was in AF on all ECGs dating back to 10/24 (was in sinus in 10/24) - Continue Eliquis    3. Acute on CKD stage IV - Baseline SCr 3. Had previously discussed PD in outpatient.  - CRRT started 12/26. Stopped 12/29. - plan as above, suspect HD will be permanent. iHD trial 1/1  4. Pleural effusion - s/p tap this admit  5. Gout - in R hand, improved.  - uric acid 17.6 s/p colchicine  and prednisone   - continue allopurinol  50 daily  CRITICAL CARE Performed by: Antion Andres  Total critical care time: 8 minutes  -Critical care time was exclusive of separately billable procedures and treating other patients. -Critical care was necessary to treat or prevent imminent or life-threatening deterioration. -Critical care was time spent personally by me on the following activities: development of treatment plan with patient and/or surrogate as well as nursing, discussions with consultants,  evaluation of patient's response to treatment, examination of patient, obtaining history from patient or surrogate, ordering and performing treatments and interventions, ordering and review of laboratory studies, ordering and review of radiographic studies, pulse oximetry and re-evaluation of patient's condition.  Length of Stay: 8  Dillie Burandt, NP  06/22/2024, 7:12 AM  Advanced Heart Failure Team Pager [phone] (M-F; 7a - 5p)   Please visit [email or web address]: For overnight coverage please call cardiology fellow first. If fellow not available call Shock/ECMO MD on call.  For ECMO / Mechanical Support (Impella, IABP, LVAD) issues call Shock / ECMO  MD on call.   "

## 2024-06-22 NOTE — Progress Notes (Signed)
 Orthopedic Tech Progress Note Patient Details:  Keith Leblanc 1966-05-12 [id number]  Ortho Devices Type of Ortho Device: Ace wrap, Unna boot Ortho Device/Splint Location: BLE Ortho Device/Splint Interventions: Ordered, Application, Adjustment   Post Interventions Patient Tolerated: Well Instructions Provided: Care of device  [name] Pac 06/22/2024, 8:58 AM

## 2024-06-23 DIAGNOSIS — R0600 Dyspnea, unspecified: Secondary | ICD-10-CM | POA: Diagnosis present

## 2024-06-23 DIAGNOSIS — Z743 Need for continuous supervision: Secondary | ICD-10-CM | POA: Diagnosis not present

## 2024-06-23 DIAGNOSIS — F339 Major depressive disorder, recurrent, unspecified: Secondary | ICD-10-CM | POA: Diagnosis present

## 2024-06-23 DIAGNOSIS — J90 Pleural effusion, not elsewhere classified: Secondary | ICD-10-CM | POA: Diagnosis present

## 2024-06-23 DIAGNOSIS — I5043 Acute on chronic combined systolic (congestive) and diastolic (congestive) heart failure: Secondary | ICD-10-CM

## 2024-06-23 DIAGNOSIS — I425 Other restrictive cardiomyopathy: Secondary | ICD-10-CM | POA: Insufficient documentation

## 2024-06-23 DIAGNOSIS — R609 Edema, unspecified: Secondary | ICD-10-CM | POA: Diagnosis not present

## 2024-06-23 DIAGNOSIS — D649 Anemia, unspecified: Secondary | ICD-10-CM

## 2024-06-23 DIAGNOSIS — I480 Paroxysmal atrial fibrillation: Secondary | ICD-10-CM | POA: Diagnosis present

## 2024-06-23 DIAGNOSIS — I429 Cardiomyopathy, unspecified: Secondary | ICD-10-CM

## 2024-06-23 DIAGNOSIS — I5023 Acute on chronic systolic (congestive) heart failure: Secondary | ICD-10-CM | POA: Diagnosis present

## 2024-06-23 DIAGNOSIS — I959 Hypotension, unspecified: Secondary | ICD-10-CM

## 2024-06-23 DIAGNOSIS — I5082 Biventricular heart failure: Secondary | ICD-10-CM | POA: Diagnosis present

## 2024-06-23 DIAGNOSIS — I509 Heart failure, unspecified: Secondary | ICD-10-CM

## 2024-06-23 LAB — COOXEMETRY PANEL
Carboxyhemoglobin: 1.1 % (ref 0.5–1.5)
Methemoglobin: <0.7 % (ref 0.0–1.5)
O2 Saturation: 53.4 %
Total hemoglobin: 9.6 g/dL — ABNORMAL LOW (ref 12.0–16.0)

## 2024-06-23 LAB — MAGNESIUM: Magnesium: 2.4 mg/dL (ref 1.7–2.4)

## 2024-06-23 LAB — RENAL FUNCTION PANEL
Albumin: 3.6 g/dL (ref 3.5–5.0)
Anion gap: 10 (ref 5–15)
BUN: 26 mg/dL — ABNORMAL HIGH (ref 6–20)
CO2: 24 mmol/L (ref 22–32)
Calcium: 9.5 mg/dL (ref 8.9–10.3)
Chloride: 100 mmol/L (ref 98–111)
Creatinine, Ser: 3.62 mg/dL — ABNORMAL HIGH (ref 0.61–1.24)
GFR, Estimated: 19 mL/min — ABNORMAL LOW
Glucose, Bld: 103 mg/dL — ABNORMAL HIGH (ref 70–99)
Phosphorus: 4.8 mg/dL — ABNORMAL HIGH (ref 2.5–4.6)
Potassium: 4.1 mmol/L (ref 3.5–5.1)
Sodium: 134 mmol/L — ABNORMAL LOW (ref 135–145)

## 2024-06-23 LAB — GLUCOSE, CAPILLARY
Glucose-Capillary: 104 mg/dL — ABNORMAL HIGH (ref 70–99)
Glucose-Capillary: 137 mg/dL — ABNORMAL HIGH (ref 70–99)

## 2024-06-23 LAB — CBC
HCT: 30.5 % — ABNORMAL LOW (ref 39.0–52.0)
Hemoglobin: 9.3 g/dL — ABNORMAL LOW (ref 13.0–17.0)
MCH: 27.6 pg (ref 26.0–34.0)
MCHC: 30.5 g/dL (ref 30.0–36.0)
MCV: 90.5 fL (ref 80.0–100.0)
Platelets: 255 K/uL (ref 150–400)
RBC: 3.37 MIL/uL — ABNORMAL LOW (ref 4.22–5.81)
RDW: 17.4 % — ABNORMAL HIGH (ref 11.5–15.5)
WBC: 4.4 K/uL (ref 4.0–10.5)
nRBC: 0 % (ref 0.0–0.2)

## 2024-06-23 LAB — HEPATITIS B SURFACE ANTIBODY, QUANTITATIVE: Hep B S AB Quant (Post): <3.5 m[IU]/mL — ABNORMAL LOW

## 2024-06-23 MED ORDER — HEPARIN SODIUM (PORCINE) 1000 UNIT/ML DIALYSIS
20.0000 [IU]/kg | INTRAMUSCULAR | Status: DC | PRN
  Administered 2024-06-23: 1900 [IU] via INTRAVENOUS_CENTRAL

## 2024-06-23 MED ORDER — QUETIAPINE FUMARATE 50 MG PO TABS: 50.0000 mg | ORAL_TABLET | Freq: Every day | ORAL | Status: AC

## 2024-06-23 MED ORDER — DOBUTAMINE-DEXTROSE 4-5 MG/ML-% IV SOLN: 5.0000 ug/kg/min | INTRAVENOUS | Status: AC

## 2024-06-23 MED ORDER — ONDANSETRON HCL 4 MG/2ML IJ SOLN: 4.0000 mg | Freq: Four times a day (QID) | INTRAMUSCULAR | Status: AC | PRN

## 2024-06-23 MED ORDER — PROSOURCE PLUS PO LIQD: 30.0000 mL | Freq: Three times a day (TID) | ORAL | Status: AC

## 2024-06-23 MED ORDER — MIDODRINE HCL 10 MG PO TABS: 10.0000 mg | ORAL_TABLET | Freq: Three times a day (TID) | ORAL | Status: AC

## 2024-06-23 MED ORDER — ALLOPURINOL 100 MG PO TABS: 50.0000 mg | ORAL_TABLET | Freq: Every day | ORAL | Status: AC

## 2024-06-23 MED ORDER — PROSOURCE PLUS PO LIQD
30.0000 mL | Freq: Three times a day (TID) | ORAL | Status: DC
  Administered 2024-06-23 (×2): 30 mL via ORAL
  Filled 2024-06-23 (×2): qty 30

## 2024-06-23 MED ORDER — MIDODRINE HCL 5 MG PO TABS
10.0000 mg | ORAL_TABLET | Freq: Three times a day (TID) | ORAL | Status: DC
  Administered 2024-06-23: 10 mg via ORAL
  Filled 2024-06-23 (×2): qty 2

## 2024-06-23 MED ORDER — ALTEPLASE 2 MG IJ SOLR: 2.0000 mg | Freq: Once | INTRAMUSCULAR | Status: DC | PRN

## 2024-06-23 MED ORDER — ROSUVASTATIN CALCIUM 10 MG PO TABS: 10.0000 mg | ORAL_TABLET | Freq: Every day | ORAL | Status: AC

## 2024-06-23 MED ORDER — DOCUSATE SODIUM 100 MG PO CAPS: 100.0000 mg | ORAL_CAPSULE | Freq: Two times a day (BID) | ORAL | Status: AC

## 2024-06-23 MED ORDER — SENNA 8.6 MG PO TABS: 1.0000 | ORAL_TABLET | Freq: Two times a day (BID) | ORAL | Status: AC

## 2024-06-23 MED ORDER — ONDANSETRON HCL 4 MG PO TABS: 4.0000 mg | ORAL_TABLET | Freq: Four times a day (QID) | ORAL | Status: AC | PRN

## 2024-06-23 MED ORDER — ADULT MULTIVITAMIN W/MINERALS CH: 1.0000 | ORAL_TABLET | Freq: Every day | ORAL | Status: AC

## 2024-06-23 MED ORDER — FAMOTIDINE 10 MG PO TABS: 10.0000 mg | ORAL_TABLET | Freq: Every day | ORAL | Status: AC

## 2024-06-23 MED ORDER — SODIUM CHLORIDE 0.9% FLUSH: 10.0000 mL | INTRAVENOUS | Status: AC | PRN

## 2024-06-23 MED ORDER — HEPARIN SODIUM (PORCINE) 1000 UNIT/ML IJ SOLN
INTRAMUSCULAR | Status: AC
  Filled 2024-06-23: qty 5

## 2024-06-23 MED ORDER — TRAZODONE HCL 100 MG PO TABS: 100.0000 mg | ORAL_TABLET | Freq: Every day | ORAL | Status: AC

## 2024-06-23 MED ORDER — INSULIN ASPART 100 UNIT/ML IJ SOLN: 1.0000 [IU] | Freq: Three times a day (TID) | INTRAMUSCULAR | Status: AC

## 2024-06-23 MED ORDER — ANTICOAGULANT SODIUM CITRATE 4% (200MG/5ML) IV SOLN: 5.0000 mL | Status: DC | PRN

## 2024-06-23 MED ORDER — ACETAMINOPHEN 325 MG PO TABS: 650.0000 mg | ORAL_TABLET | Freq: Four times a day (QID) | ORAL | Status: AC | PRN

## 2024-06-23 MED ORDER — HEPARIN SODIUM (PORCINE) 1000 UNIT/ML DIALYSIS
1000.0000 [IU] | INTRAMUSCULAR | Status: DC | PRN
  Administered 2024-06-23: 2400 [IU]

## 2024-06-23 NOTE — Discharge Summary (Signed)
 Physician Discharge Summary         Patient ID: Keith Leblanc MRN: [id number] DOB/AGE: [birth year] 58 y.o.  Admit date: 06/14/2024 Discharge date: 06/23/2024  Discharge Diagnoses:    Active Hospital Problems   Diagnosis Date Noted   Biventricular HF (heart failure) (HCC) 06/23/2024    Priority: 1.   Acute on chronic HFrEF (heart failure with reduced ejection fraction) (HCC) 06/18/2024    Priority: 1.   Restrictive cardiomyopathy (HCC) 06/23/2024    Priority: 2.   NYHA Class IV cardiovascular function (HCC) 06/23/2024    Priority: 2.   Cardiorenal syndrome 06/18/2024    Priority: 3.   CKD (chronic kidney disease) stage 5, GFR less than 15 ml/min (HCC) 06/18/2024    Priority: 3.   Acute-on-chronic renal failure 03/12/2024    Priority: 3.   Paroxysmal A-fib (HCC) 03/12/2024    Priority: 4.   Anemia 06/23/2024   Type 2 diabetes mellitus with hyperglycemia (HCC) 08/27/2016   Tobacco abuse 07/07/2016   Essential hypertension 04/28/2015   Gout 04/28/2015    Resolved Hospital Problems   Diagnosis Date Noted Date Resolved   Pleural effusion on right 06/23/2024 06/23/2024    Priority: 2.   Gastritis and gastroduodenitis 04/19/2024 06/23/2024      Discharge summary    58 yo male with past medical history significant for ACC/AHA Stage D CM (primarily restrictive with biventricular involvement), with NYHF class IV symptoms. Presented 12/22 with 2-weeks dyspnea, worsening heart failure. Esophagitis/gastritis, former ETOH abuse, DM2, HTN, CKD IV, prior CVA, PAF on Eliquis . CXR showed right pleural effusion and patient underwent R thoracentesis with 1.3L removed. Heart failure consulted and started on milrinone /lasix  with minimal response Hospital course 12/22 Admit to medical service w/  worsening heart failure; started on milrinone /lasix ; CXR right pleural effusion s/p thoracentesis with 1.3L removed 12/23-12/25 minimal response to inotropes/lasix >worsening renal function-lasix   gtt stopped 12/26: ICU post-RHC left in place and transferred to ICU to optimize cardiac function Findings:  On milrinone  0.125 mcg/kg/min RA = 19 RV = 49/19 PA = 51/29 (36) PCW = 28 Fick cardiac output/index = 6.2/2.6 Thermo CO/CI 3.7/1.6 PVR = 1.1 (Fick) 1.9 (TD) Ao sat = 95% PA sat = 56%, 57% PAPi = 1.1 Assessment:  1. Markedly elevated filling pressures with low cardiac output by TD  2. Progressive cardiorenal syndrome Nephrology consulted. Started on CRRT for cardiorenal syndrome after failing diuretics and inotrope support. Also treated for gout flare w/ e d systemic steroids and started on allopurinol  and colchicine   12/28 repeated limited ECHO: Left ventricular ejection fraction, by estimation, is 30 to 35%. The left ventricle has moderately decreased function. The left ventricle demonstrates global hypokinesis. There is moderate concentric left ventricular hypertrophy. Right Ventricle: The right ventricular size is mildly enlarged. Right ventricular systolic function is moderately reduced. Mild TVR, IVC dilated with <50% respiratory variability  12/29: Transitioned off CRRT, plan for IHD today, milrinone  transitioned to dobutamine   12/30 On dobutamine , plan for IHD in next day or so, had more sleep overnight  12/31 dobutamine  increased to 5mcg/kg/min  12/31 I&O down > 10 liters since admission. Weight first intermittent HD session. Weight 96.6kg  (admit weight was 103.8kg)  Discharge Plan by Active Problems    Acute on chronic systolic and diastolic heart failure secondary to Restrictive cardiomyopathy w/ biventricular involvement (EF on 28th 30-35% mod reduced RV fxn) -end stage acute on chronic HFrEF (NYHF stage IV on admit) Hypotension Previously referred to [facility] for transplant  Severely reduced  VO2, TD index.  Prior Workup included: CMR, biopsy without evidence of cardiac amyloid. Biopsy with hypertrophic nuclei, genetic testing ordered previously. Suspect  most likely a form of hypertrophic cardiomyopathy.  plan Cont intermittent iHD  GDMT limited due to renal failure/shock status  Midodrine  10 mg 3 times daily to support blood pressure Dobutamine  per cards/adv HF Continue to trend Coox and CVP daily  Continue to mobilize, PT  Continue 2 gram sodium diet  Awaiting cardiac transplant eval he is O positive    Anemia likely multifactorial in setting of hemodilution d/t ADHF with prior GI blood loss Hgb stable > 9 for last 4d Plan Continue to monitor for signs of bleeding Continue check CBC daily, with h/h  Transfuse for hgb < 7    AKI on CKD IV secondary to cardiorenal syndrome Hypophosphatemia-resolved for now since CRRT stopped  plan Nephro following, appreciate assistance, plan for iHD in next couple days  Continue to trend renal function daily  Continue to monitor and optimize electrolytes daily Continue to monitor urine output Continue strict I/Os Continue Adequate renal perfusion  Avoid nephrotoxic agents  Continue 2 gram sodium diet  Awaiting kidney transplant eval   Chronic AFib-on apixaban  A-fib rate controlled, not on any meds to assist with rate plan Continue cardiac tele Continue apixaban     Right pleural effusion, s/p thoracentesis with 1.3L removed on 12/22 plan PRN CXR    Gout-right hand Improved. S/p colchicine  and pred.  plan Continue allopurinol     Hx gastritis & esophagitis, EGD 10/27 for ABLA; friable gastric mucosa Diverticulosis (dx 2019) plan Continue protonix  and pepcid     Persistent left eye floaters since prior hospitalization plan Follow up outpatient    Hyperglycemia Currently w/ excellent glycemic control plan Continue SSI Continue CBG goal 140-180    Insomnia Improved overnight  plan Dc melatonin on 12/29- increased trazadone 100mg  and added seroquel  50mg  HS       Consults  Advanced heart failure Nephrology     Discharge Exam: BP (!) 84/61   Pulse 71   Temp 98.6 F  (37 C)   Resp 18   Ht 6' 5 (1.956 m)   Wt 96.6 kg   SpO2 100%   BMI 25.25 kg/m   General Sitting in bed no acute distress HEENT normocephalic atraumatic no jugular venous distention Pulmonary: Clear to auscultation no accessory use Cardiac regular rate and rhythm Abdomen soft nontender no organomegaly Extremities warm dry brisk cap refill Neuro intact Labs at discharge   Lab Results  Component Value Date   CREATININE 3.62 (H) 06/23/2024   BUN 26 (H) 06/23/2024   NA 134 (L) 06/23/2024   K 4.1 06/23/2024   CL 100 06/23/2024   CO2 24 06/23/2024   Lab Results  Component Value Date   WBC 4.4 06/23/2024   HGB 9.3 (L) 06/23/2024   HCT 30.5 (L) 06/23/2024   MCV 90.5 06/23/2024   PLT 255 06/23/2024   Lab Results  Component Value Date   ALT 23 06/15/2024   AST 27 06/15/2024   ALKPHOS 135 (H) 06/15/2024   BILITOT 0.8 06/15/2024   Lab Results  Component Value Date   INR 1.6 (H) 04/17/2024   INR 1.1 11/08/2022    Current radiological studies    No results found.  Disposition:    Discharge disposition: [facility]-Another Health Care Institution Not Defined       Discharge Instructions     Increase activity slowly   Complete by: As directed  No wound care   Complete by: As directed        Allergies as of 06/23/2024   No Known Allergies      Medication List     PAUSE taking these medications    buPROPion  300 MG 24 hr tablet Wait to take this until your doctor or other care provider tells you to start again. Commonly known as: WELLBUTRIN  XL TAKE 1 TABLET BY MOUTH ONCE  DAILY   colchicine  0.6 MG tablet Wait to take this until your doctor or other care provider tells you to start again. TAKE 1 TABLET BY MOUTH DAILY  WHEN HAVING FLARES TAKE 1 TABLET BY MOUTH TWICE DAILY   hydrOXYzine  50 MG capsule Wait to take this until your doctor or other care provider tells you to start again. Commonly known as: VISTARIL  Take 1 capsule (50 mg total) by mouth  every 8 (eight) hours as needed.   oxybutynin  5 MG tablet Wait to take this until your doctor or other care provider tells you to start again. Commonly known as: DITROPAN  Take 1 tablet (5 mg total) by mouth every 8 (eight) hours as needed for bladder spasms.   torsemide  20 MG tablet Wait to take this until your doctor or other care provider tells you to start again. Commonly known as: DEMADEX  Take 3 tablets (60 mg total) by mouth 2 (two) times daily.       STOP taking these medications    potassium chloride  SA 20 MEQ tablet Commonly known as: KLOR-CON  M   triamcinolone  cream 0.5 % Commonly known as: KENALOG        TAKE these medications    (feeding supplement) PROSource Plus liquid Take 30 mLs by mouth 3 (three) times daily between meals.   acetaminophen  325 MG tablet Commonly known as: TYLENOL  Take 2 tablets (650 mg total) by mouth every 6 (six) hours as needed for mild pain (pain score 1-3) or fever (or Fever >/= 101).   allopurinol  100 MG tablet Commonly known as: ZYLOPRIM  Take 0.5 tablets (50 mg total) by mouth daily. Start taking on: June 24, 2024   DOBUTamine  4-5 MG/ML-% infusion Commonly known as: DOBUTREX  Inject 480 mcg/min into the vein continuous.   docusate sodium  100 MG capsule Commonly known as: COLACE Take 1 capsule (100 mg total) by mouth 2 (two) times daily.   Eliquis  5 MG Tabs tablet Generic drug: apixaban  TAKE 1 TABLET BY MOUTH TWICE  DAILY   famotidine  10 MG tablet Commonly known as: PEPCID  Take 1 tablet (10 mg total) by mouth daily. Start taking on: June 24, 2024 What changed:  medication strength how much to take   insulin  aspart 100 UNIT/ML injection Commonly known as: novoLOG  Inject 1-3 Units into the skin 4 (four) times daily -  before meals and at bedtime.   midodrine  10 MG tablet Commonly known as: PROAMATINE  Take 1 tablet (10 mg total) by mouth 3 (three) times daily with meals. What changed:  medication strength how  much to take   multivitamin with minerals Tabs tablet Take 1 tablet by mouth daily. Start taking on: June 24, 2024 What changed: when to take this   ondansetron  4 MG tablet Commonly known as: ZOFRAN  Take 1 tablet (4 mg total) by mouth every 6 (six) hours as needed for nausea.   ondansetron  4 MG/2ML Soln injection Commonly known as: ZOFRAN  Inject 2 mLs (4 mg total) into the vein every 6 (six) hours as needed for nausea.   pantoprazole  40 MG  tablet Commonly known as: PROTONIX  Take 1 tablet (40 mg total) by mouth daily.   QUEtiapine  50 MG tablet Commonly known as: SEROQUEL  Take 1 tablet (50 mg total) by mouth at bedtime.   rosuvastatin  10 MG tablet Commonly known as: CRESTOR  Take 1 tablet (10 mg total) by mouth daily. Start taking on: June 24, 2024 What changed:  medication strength how much to take   senna 8.6 MG Tabs tablet Commonly known as: SENOKOT Take 1 tablet (8.6 mg total) by mouth 2 (two) times daily.   sodium chloride  flush 0.9 % Soln Commonly known as: NS 10-40 mLs by Intracatheter route as needed (flush).   traZODone  100 MG tablet Commonly known as: DESYREL  Take 1 tablet (100 mg total) by mouth at bedtime.         Follow-up appointment   At [facility] Discharge Condition:    stable  Physician Statement:   The Patient was personally examined, the discharge assessment and plan has been personally reviewed and I agree with ACNP Lark Runk's assessment and plan. 36  minutes of time have been dedicated to discharge assessment, planning and discharge instructions.   Signed: Jeralyn FORBES Banner 06/23/2024, 1:49 PM

## 2024-06-23 NOTE — Progress Notes (Signed)
 Admit: 06/14/2024 LOS: 9  51M AoC HFrEF and dialysis dependent AoCKD4   S: No overnight events Blood pressure soft, on dobutamine . On midodrine  10 mg 3 times daily For attempted IHD today No meaningful UOP in the past 24 hours K4.1, creatinine up to 3.6, bicarbonate 24 this morning  O: 12/30 0701 - 12/31 0700 In: 163.2 [I.V.:163.2] Out: -   Filed Weights   06/21/24 0500 06/22/24 0500 06/23/24 0500  Weight: 96 kg 95.8 kg 96.6 kg    Recent Labs  Lab 06/22/24 0420 06/22/24 1605 06/23/24 0540  NA 136 137 134*  K 4.0 4.2 4.1  CL 101 102 100  CO2 25 25 24   GLUCOSE 92 183* 103*  BUN 18 23* 26*  CREATININE 2.51* 3.34* 3.62*  CALCIUM  9.5 9.1 9.5  PHOS 3.7 4.2 4.8*   Recent Labs  Lab 06/21/24 0454 06/22/24 0420 06/23/24 0540  WBC 4.7 4.5 4.4  HGB 9.7* 9.5* 9.3*  HCT 31.7* 31.5* 30.5*  MCV 90.8 91.0 90.5  PLT 312 260 255    Scheduled Meds:  allopurinol   50 mg Oral Daily   apixaban   5 mg Oral BID   buPROPion   300 mg Oral Daily   Chlorhexidine  Gluconate Cloth  6 each Topical Daily   docusate sodium   100 mg Oral BID   famotidine   10 mg Oral Daily   insulin  aspart  1-3 Units Subcutaneous TID AC & HS   midodrine   10 mg Oral TID WC   multivitamin with minerals  1 tablet Oral Daily   pantoprazole   40 mg Oral Daily   QUEtiapine   50 mg Oral QHS   rosuvastatin   10 mg Oral Daily   senna  1 tablet Oral BID   sodium chloride  flush  10-40 mL Intracatheter Q12H   traZODone   100 mg Oral QHS   Continuous Infusions:  DOBUTamine  5 mcg/kg/min (06/23/24 0700)   prismasol  BGK 4/2.5 400 mL/hr at 06/21/24 0935   prismasol  BGK 4/2.5 400 mL/hr at 06/21/24 0935   prismasol  BGK 4/2.5 1,500 mL/hr at 06/21/24 1451   PRN Meds:.acetaminophen  **OR** acetaminophen , heparin , ondansetron  **OR** ondansetron  (ZOFRAN ) IV, mouth rinse, oxybutynin , sodium chloride , sodium chloride  flush  ABG    Component Value Date/Time   PHART 7.255 (L) 07/07/2016 1235   PCO2ART 26.9 (L) 07/07/2016 1235    PO2ART 89.4 07/07/2016 1235   HCO3 24.0 06/18/2024 1302   HCO3 23.7 06/18/2024 1302   TCO2 25 06/18/2024 1302   TCO2 25 06/18/2024 1302   ACIDBASEDEF 1.0 06/18/2024 1302   ACIDBASEDEF 1.0 06/18/2024 1302   O2SAT 53.4 06/23/2024 0540   NAD, in bed, awake, conversant Regular, normal S1 and S2 IJ temporary HD catheter present, bandage Regular rhythm Diminished in the bases bilaterally Unna boots on, no edema proximally  A Dialysis dependent AKI on CKD 4 from cardiorenal syndrome Started CRRT 12/26, transitioned off 12/29 Using right IJ temp HD catheter Plan to attempt intermittent hemodialysis today with midodrine  support Acute on chronic HFrEF on inotropic support with upcoming appointments for heart/kidney transplant at [facility] Chronic hypotension #2, midodrine , not on pressors Mild hypophosphatemia; resolved, CTM Mild anemia, hemoglobin 9s, CTM  P Plan for attempted intermittent hemodialysis today, earlier on the day: 3h, 1 to 2L UF, tight heparin ; might require pressor support Can consider PD transition, which was his outpatient preference, depending on what timeframe these things occur I favor inpatient transfer to [facility] for evaluation of heart and kidney transplant.  I do not see him having long-term stability  for outpatient hemodialysis.  Assessment and plan discussed with AHF service  Bernardino Gasman, MD [facility]

## 2024-06-23 NOTE — Progress Notes (Signed)
 "    Advanced Heart Failure Rounding Note  [facility] Cardiologist: Dr. Zenaida  Chief Complaint: End stage combined systolic and diastolic HF/ SOB  Patient Profile   Keith Leblanc is a 58 y.o. male with ACC/AHA stage D cardiomyopathy, former heavy drinker, type 2DM, HTN, CKD w/ progression to ESRD, HLD, history of stroke, and PAF on eliquis .   Admitted with acute on chronic CHF.   Significant events:   12/22: S/p thoracentesis 1.3L 12/27: RHC on milrinone  and CRRT started. RA 19, PA 51/29 (36), PCW 28, CO/CI 3.7/1.6, PAPi 1.1 12/29: Stopped CRRT.  Subjective:    Trial iHD today  Remains on 5 DBA and 10 mg midodrine  TID. Not on pressors.   BP 80s-90s/50s-60s overnight  Swan #s RA 8 PA 31/21 CO 7.1 CI 3.08 Co-ox 53% with Fick CI 2.2.  No dyspnea at rest. Trying to stay positive.  Objective:    Weight Range: 96.6 kg Body mass index is 25.25 kg/m.   Vital Signs:   Temp:  [97.7 F (36.5 C)-98.6 F (37 C)] 98.4 F (36.9 C) (12/31 0645) Pulse Rate:  [69-92] 78 (12/31 0645) Resp:  [8-26] 14 (12/31 0645) BP: (81-101)/(56-81) 91/65 (12/31 0600) SpO2:  [74 %-100 %] 99 % (12/31 0645) Weight:  [96.6 kg] 96.6 kg (12/31 0500) Last BM Date : 06/21/24  Weight change: Filed Weights   06/21/24 0500 06/22/24 0500 06/23/24 0500  Weight: 96 kg 95.8 kg 96.6 kg   Intake/Output:  Intake/Output Summary (Last 24 hours) at 06/23/2024 0846 Last data filed at 06/23/2024 0700 Gross per 24 hour  Intake 159.55 ml  Output --  Net 159.55 ml    Physical Exam   General:  Thin, chronically ill appearing. Sitting up in bed.  Neck: R internal jugular PA catheter Cor: Irregular rhythm. No murmurs. Lungs: clear Abdomen: soft, nontender, nondistended. Extremities: no edema Neuro: alert & orientedx3. Affect pleasant   Telemetry   Afib, 70s-80s  Labs   CBC Recent Labs    06/22/24 0420 06/23/24 0540  WBC 4.5 4.4  HGB 9.5* 9.3*  HCT 31.5* 30.5*  MCV 91.0 90.5  PLT 260 255    Basic Metabolic Panel Recent Labs    87/69/74 0420 06/22/24 1605 06/23/24 0540  NA 136 137 134*  K 4.0 4.2 4.1  CL 101 102 100  CO2 25 25 24   GLUCOSE 92 183* 103*  BUN 18 23* 26*  CREATININE 2.51* 3.34* 3.62*  CALCIUM  9.5 9.1 9.5  MG 2.5*  --  2.4  PHOS 3.7 4.2 4.8*   Liver Function Tests Recent Labs    06/22/24 1605 06/23/24 0540  ALBUMIN  3.9 3.6    BNP (last 3 results) Recent Labs    03/24/24 1624 04/17/24 1704  BNP 1,093.8* 1,324.0*   ProBNP (last 3 results) Recent Labs    03/12/24 0942 06/14/24 1241  PROBNP 5,364.0* 8,130.0*   Medications:   Scheduled Medications:  allopurinol   50 mg Oral Daily   apixaban   5 mg Oral BID   buPROPion   300 mg Oral Daily   Chlorhexidine  Gluconate Cloth  6 each Topical Daily   docusate sodium   100 mg Oral BID   famotidine   10 mg Oral Daily   insulin  aspart  1-3 Units Subcutaneous TID AC & HS   midodrine   10 mg Oral TID WC   multivitamin with minerals  1 tablet Oral Daily   pantoprazole   40 mg Oral Daily   QUEtiapine   50 mg Oral QHS   rosuvastatin   10 mg Oral Daily   senna  1 tablet Oral BID   sodium chloride  flush  10-40 mL Intracatheter Q12H   traZODone   100 mg Oral QHS    Infusions:  DOBUTamine  5 mcg/kg/min (06/23/24 0700)   prismasol  BGK 4/2.5 400 mL/hr at 06/21/24 0935   prismasol  BGK 4/2.5 400 mL/hr at 06/21/24 0935   prismasol  BGK 4/2.5 1,500 mL/hr at 06/21/24 1451    PRN Medications: acetaminophen  **OR** acetaminophen , heparin , ondansetron  **OR** ondansetron  (ZOFRAN ) IV, mouth rinse, oxybutynin , sodium chloride , sodium chloride  flush  Assessment/Plan   1. End stage combined acute on chronic systolic and diastolic heart failurer: Primarily restrictive cardiomyopathy with biventricular involvement. Severely reduced VO2, TD index. Workup included CMR, biopsy without evidence of cardiac amyloid. Biopsy with hypertrophic nuclei, genetic testing ordered previously.  Suspect most likely a form of hypertrophic  cardiomyopathy.  - Echo 10/25 EF 40% RV moderately down  - Repeat echo 12/25 EF 30-35%, moderately reduced RV. RHC as above - Repeat ltd echo 06/20/24: EF 30-35%, mod reduced RV function - NYHA IV on admission. Failed to diurese well on milrinone  and IV lasix   - CRRT started on 12/26. Weight down > 20 lbs.   - With acute renal failure and hypotension Milrinone  switched to DBA - Fick CI 2.2, 3.1 by TD on 5 DBA.  - Continue midodrine  10 TID - CRRT stopped 12/29. Discussed with Nephrology. Plan to see how he tolerates iHD today. Can use norepi as needed for blood pressure support. - Suspect HD need may be permanent and will need to see if he can tolerate off inotropes.  - Considering candidacy for heart/kidney transplant.  He is O +. Has appt at [facility] 1/5 but do not think we will be able to keep him out of the hospital long enough to complete outpatient evaluation. Dr. Zenaida contacted [facility] and spoke with Dr. DeVore, who accepted him. Awaiting bed placement.  2. Chronic AF - Rate controlled - Duration unknown. Was in AF on all ECGs dating back to 10/24 (was in sinus in 10/24) - Continue Eliquis    3. AKI on CKD IV >> now likely ESRD - Baseline SCr 3. Had previously discussed PD in outpatient.  - CRRT started 12/26. Stopped 12/29. - plan as above, suspect HD will be permanent. iHD trial today. Eventually try to get to transplant.  4. Pleural effusion - s/p tap this admit  5. Gout - in R hand, improved.  - uric acid 17.6 s/p colchicine  and prednisone   - continue allopurinol  50 daily  Discussed with CCM at bedside.  CRITICAL CARE Performed by: COLLETTA SHAVER N   Total critical care time: 9 minutes  Critical care time was exclusive of separately billable procedures and treating other patients.  Critical care was necessary to treat or prevent imminent or life-threatening deterioration.  Critical care was time spent personally by me on the following activities: development of  treatment plan with patient and/or surrogate as well as nursing, discussions with consultants, evaluation of patient's response to treatment, examination of patient, obtaining history from patient or surrogate, ordering and performing treatments and interventions, ordering and review of laboratory studies, ordering and review of radiographic studies, pulse oximetry and re-evaluation of patient's condition.   Length of Stay: 9  Gaynor Genco N, PA-C  06/23/2024, 8:46 AM  Advanced Heart Failure Team Pager [phone] (M-F; 7a - 5p)   Please visit [email or web address]: For overnight coverage please call cardiology fellow first. If fellow not available call Shock/ECMO MD on call.  For ECMO / Mechanical Support (Impella, IABP, LVAD) issues call Shock / ECMO MD on call.   "

## 2024-06-23 NOTE — Progress Notes (Addendum)
 "  NAME:  Keith Leblanc, MRN:  [id number], DOB:  Sep 20, 1965, LOS: 9 ADMISSION DATE:  06/14/2024, CONSULTATION DATE:  06/18/24 REFERRING MD:  Noralee HERO, CHIEF COMPLAINT:  dyspnea x2 weeks  History of Present Illness:  Keith Leblanc is a 58 yo male with past medical history significant for end-stage diastolic heart failure, Esophagitis/gastritis, former ETOH abuse, DM2, HTN, CKD IV, prior CVA, PAF on Eliquis  who presented 12/22 with 2-weeks dyspnea, worsening heart failure. CXR showed right pleural effusion and patient underwent R thoracentesis with 1.3L removed. Heart failure following and started on milrinone /lasix  with minimal response, planned for RHC today. PCCM consulted for ICU admission post-cath.  Patient recently hospitalized here at [facility] 04/17/24-05/03/2024 for acute on chronic decompensated heart failure s/p RHC with elevated filling pressures, reduced index, LHC deferred d/t renal function, and was treated with inotropes and diuretics.  Pertinent Medical History:   Past Medical History:  Diagnosis Date   Blood in stool    bright red blood    Chicken pox    Diabetes mellitus (HCC)    Erectile dysfunction    Gout    Hypertension    Stroke (HCC)    Significant Hospital Events: Including procedures, antibiotic start and stop dates in addition to other pertinent events   12/22 Admit TRH worsening heart failure; started on milrinone /lasix ; CXR right pleural effusion s/p thoracentesis with 1.3L removed 12/23-12/25 minimal response to inotropes/lasix >worsening renal function-lasix  gtt stopped 12/26: ICU post-RHC 12/29: Transitioned off CRRT, plan for IHD today, milrinone  transitioned to dobutamine   12/30 On dobutamine , plan for IHD in next day or so, had more sleep overnight   Interim History / Subjective:  No new shortness of breath or chest pain.  Awaiting his initial IHD  Objective    Blood pressure 91/65, pulse 78, temperature 98.4 F (36.9 C), resp. rate 14, height 6' 5  (1.956 m), weight 96.6 kg, SpO2 99%. PAP: (16-42)/(4-34) 21/16 CVP:  [0 mmHg-18 mmHg] 8 mmHg CO:  [4.3 L/min-5 L/min] 4.6 L/min CI:  [1.84 L/min/m2-2.18 L/min/m2] 2 L/min/m2      Intake/Output Summary (Last 24 hours) at 06/23/2024 0900 Last data filed at 06/23/2024 0700 Gross per 24 hour  Intake 155.95 ml  Output --  Net 155.95 ml   Filed Weights   06/21/24 0500 06/22/24 0500 06/23/24 0500  Weight: 96 kg 95.8 kg 96.6 kg     Examination General This is a pleasant 58 year old male patient sitting up in bed he is in no acute distress currently HEENT normocephalic atraumatic has right HD catheter in left PAC catheter dressings are clean dry and intact Pulmonary currently on room air clear breath sounds bilaterally Cardiac regular rate and rhythm, no obvious audible murmurs or gallop Abdomen soft nontender Extremities are warm and dry with brisk capillary refill GU not voiding Neuro intact  Resolved Problem List:   Assessment and Plan:   Acute on chronic systolic and diastolic heart failure secondary to Restrictive cardiomyopathy w/ biventricular involvement -end stage acute on chronic HFrEF (NYHF stage IV on admit) Hypotension Previously referred to [facility] for transplant evaluation-appointment supposedly 06/28/24 12/29 Transitioned off CRRT 12/30 dobuatmine increased for 2.5 to 5 12/31 co-ox trending down from 30th  plan For his initial intermittent dialysis today GDMT limited due to renal failure/shock status  Midodrine  10 mg 3 times daily to support blood pressure Dobutamine  per cards/adv HF Continue to trend Coox and CVP daily  Continue to mobilize, PT  Continue 2 gram sodium diet   Anemia likely  multifactorial in setting of hemodilution d/t ADHF with prior GI blood loss Hgb stable > 9 for last 4d Plan Continue to monitor for signs of bleeding Continue check CBC daily, with h/h  Transfuse for hgb < 7   AKI on CKD IV secondary to cardiorenal  syndrome Hypophosphatemia-resolved for now since CRRT stopped  plan Nephro following, appreciate assistance, plan for iHD in next couple days  Continue to trend renal function daily  Continue to monitor and optimize electrolytes daily Continue to monitor urine output Continue strict I/Os Continue Adequate renal perfusion  Avoid nephrotoxic agents  Continue 2 gram sodium diet   Chronic AFib-on apixaban  A-fib rate controlled, not on any meds to assist with rate plan Continue cardiac tele Continue apixaban    Right pleural effusion, s/p thoracentesis with 1.3L removed on 12/22 plan Am cxr  Gout-right hand improved plan Continue allopurinol    Hx gastritis & esophagitis, EGD 10/27 for ABLA; friable gastric mucosa Diverticulosis (dx 2019) plan Continue protonix  and pepcid    Persistent left eye floaters since prior hospitalization plan Follow up outpatient   Hyperglycemia Currently w/ excellent glycemic control plan Continue SSI Continue CBG goal 140-180   Insomnia Improved overnight  plan Dc melatonin on 12/29- increased trazadone 100mg  and added seroquel  50mg  HS   Disposition Hopefully transfer to [facility] directly for heart and kidney transplant, this will be pending how he tolerates intermittent dialysis  Critical care  I personally  spent 31 minutes  on this patient which included: review of medical records, nursing notes, progress notes, evaluation, interpretation of lab data and diagnostic studies, taking independent history, performing exam, documenting plan, ordering diagnostics and interventions for the following critical care issues: Acute renal failure with the following interventions which included: prevention of further deterioration       "

## 2024-06-23 NOTE — Progress Notes (Signed)
 Nutrition Follow Up  DOCUMENTATION CODES:  Non-severe (moderate) malnutrition in context of acute illness/injury  INTERVENTION:  Liberalize to 2g sodium diet to allow for additional options Double protein with meals  Magic cup TID with meals, each supplement provides 290 kcal and 9 grams of protein  Added Prosource Plus 30 ml TID; each supplement provides 100 kcal and 15 g protein  MVI with minerals daily  NUTRITION DIAGNOSIS:   Moderate Malnutrition related to acute illness as evidenced by mild fat depletion, mild muscle depletion, moderate muscle depletion. Diagnosis updated 12/31  GOAL:   Patient will meet greater than or equal to 90% of their needs Progressing  MONITOR:   PO intake, Supplement acceptance  REASON FOR ASSESSMENT:   Consult Assessment of nutrition requirement/status (CRRT)  ASSESSMENT:   Pt with hx of end-stage diastolic heart failure, prior EtOH abuse, HTN, HLD, type 2 DM, CKD stage IIIa, prior stroke, paroxysmal A-fib, and gout presented to ED for SOB worsening x 2 weeks since last admission. Imaging in ED showed right pleural effusion and compressive atelectasis.   12/22 - presented to ED, right thoracentesis, 1.3L removed 12/26 - RHC, showed progressive cardiorenal syndrome, transferred to [facility] for CRRT initiation  12/29- CRRT stopped  Pt likely transferring to [facility] for transplant evaluation. Pt's appetite still poor, eating on average 48% of meals. Pt receiving double protein on trays, encouraged pt to eat protein portion of tray first in case he feels early satiety with meals. Pt endorses feeling better as diuresis helped remove fluid, but still feels lethargic due to multiple recent hospitalizations. Pt will transfer today if able to tolerate HD.   Nutrition focused physical exam shows mild fat depletions and mild to moderate muscle depletions, indicative of malnutrition. Suspect malnutrition related to poor PO intake during last 3 hospitalizations,  likely an acute problem that may resolve if pt's intake improves but should be monitored for worsening. Pt endorses wt loss mostly related to fluid shifts but also feels like he has lost actual body weight and muscle. Pt meet criteria for acute malnutrition.   Admit weight: 103.8 kg  Current weight: 96.6 kg  -10L since admission  Intake/Output Summary (Last 24 hours) at 06/23/2024 1414 Last data filed at 06/23/2024 1356 Gross per 24 hour  Intake 705.54 ml  Output 125 ml  Net 580.54 ml  Net IO Since Admission: -10,048.14 mL [06/23/24 1414]  Drains/Lines: Norva - left IJ Temporary HD catheter, right IJ, triple lumen UOP x 24 hours   Nutritionally Relevant Medications:  (feeding supplement) PROSource Plus  30 mL Oral TID BM   docusate sodium   100 mg Oral BID   famotidine   10 mg Oral Daily   insulin  aspart  1-3 Units Subcutaneous TID AC & HS   midodrine   10 mg Oral TID WC   multivitamin with minerals  1 tablet Oral Daily   pantoprazole   40 mg Oral Daily   rosuvastatin   10 mg Oral Daily   senna  1 tablet Oral BID   traZODone   100 mg Oral QHS     anticoagulant sodium citrate     DOBUTamine  5 mcg/kg/min (06/23/24 1300)    Labs Reviewed: BUN 26, creatinine 3.62 CBG ranges from 103-137 mg/dL over the last 24 hours Phos 4.8 HgbA1c 5.5% (12/26)  NUTRITION - FOCUSED PHYSICAL EXAM: Flowsheet Row Most Recent Value  Orbital Region Mild depletion  Upper Arm Region Mild depletion  Thoracic and Lumbar Region Mild depletion  Buccal Region Mild depletion  Reading  Region Mild depletion  Clavicle Bone Region Mild depletion  Clavicle and Acromion Bone Region Mild depletion  Scapular Bone Region Mild depletion  Dorsal Hand Moderate depletion  Patellar Region Moderate depletion  Anterior Thigh Region Moderate depletion  Posterior Calf Region Unable to assess  [wrapped]  Edema (RD Assessment) Mild  Hair Reviewed  Eyes Reviewed  Mouth Reviewed  Skin Reviewed  Nails Reviewed      Diet Order:   Diet Order             Diet 2 gram sodium Room service appropriate? Yes with Assist; Fluid consistency: Thin  Diet effective now                   EDUCATION NEEDS:  Education needs have been addressed  Skin:  Skin Assessment: Reviewed RN Assessment  Last BM:  12/30 type 4  Height:  Ht Readings from Last 1 Encounters:  06/14/24 6' 5 (1.956 m)    Weight:  Wt Readings from Last 1 Encounters:  06/23/24 96.6 kg    Ideal Body Weight:  94.5 kg  BMI:  Body mass index is 25.25 kg/m.  Estimated Nutritional Needs:  Kcal:  2400-2600 kcal/d Protein:  140-155g/d Fluid:  2.5L/d    Josette Glance, MS, RDN, LDN Clinical Dietitian I Please reach out via secure chat

## 2024-06-30 NOTE — Progress Notes (Signed)
 "  Advanced Heart Failure/Transplant Follow Up Note    06/30/2024 Hospital Day: 8 Keith Leblanc [id number]  Patient Identification: Keith Leblanc is a 59 y.o.male  (BT O+ve, no prior sternotomy, 6'5) with PMHx of NICM (LVEF 30-35%, mod LVH, mod RVD, LVIDD 5cm), concern for HCM per EMBx, CKD stage 4, HTN, T2DM (A1c 5.5), persistent Afib on Eliquis , R MCA CVA in 10/2022 w/o residual deficits, prior alcohol use and tobacco smoking, who presented as a transfer from [facility] for heart/kidney transplant evaluation after he was admitted with decompensated heart failure and AKI on CKD requiring CRRT initiation.   Interval history:   Working with PT. Frustrated at how many tests he is having to do to get an answer and about inconsistencies from medical team conversations from him.  Objective:  Physical Exam:  BP 102/64 (BP Location: Right upper arm, Patient Position: Sitting, BP Cuff Size: Adult)   Pulse 79   Temp 36.9 C (98.5 F) (Oral)   Resp 14   Ht 195.6 cm (6' 5)   Wt 95.2 kg (209 lb 14.1 oz)   SpO2 99%   BMI 24.89 kg/m  GEN: laying in bed, NAD HEENT: MMM NECK: nl ROM, JVP unable to assess due to neck lines bilaterally CV:      Irregularly irregular             Normal S1/S2             No m/r/g PULM: CTAB, no w/r/r ABDO: soft, NT/ND, no organomegaly EXTREM: warm extremities, no edema, 2+ radial and DP pulses BL SKIN: warm and dry, no rashes  NEURO: AAOx4, CN II-XII grossly normal PSYCH: appropriate mood and affect  24-Hour Intake/Output: I/O last 2 completed shifts: In: 944.4 [P.O.:100; I.V.:304; IV Piggyback:540.4] Out: 799 [Urine:150; Other:649]  Scheduled Meds: Reviewed in Epic   Relevant Labs (All others reviewed in Epic):  Recent Labs  Lab 06/26/24 0504 06/27/24 0158 06/28/24 0318 06/29/24 0413 06/30/24 0449  WBC 6.1 6.9 7.9 8.7 7.2  HGB 9.1* 8.8* 8.9* 9.1* 8.6*  HCT 29.3* 28.9* 28.8* 29.7* 28.3*  PLT 196 182 236 255 213    Recent Labs  Lab  06/27/24 0114 06/27/24 0158 06/27/24 1143 06/28/24 0318 06/29/24 0413 06/29/24 0550 06/30/24 0449  NA 134* 134*  130* 133* 135 137 134* 138  K 5.6* 5.3* 4.7 4.3 4.5  --  4.2  CREATININE 2.4* 2.3* 2.3* 2.1* 2.2*  --  2.5*  BUN 16 16 13 13 13   --  13  CO2 24 24 24 24 26   --  24  CL 102 103 98 102 104  --  104  MG 2.4 2.5  --  2.5 2.6*  --  2.4   Recent Labs  Lab 06/23/24 1745  INR 1.6*   No results for input(s): TROPONINI, TROPONINT, CK, CKMB, NTPROBNP in the last 168 hours. Lab Results  Component Value Date   TRIG 83 06/23/2024   HDL 50 06/23/2024   LDLCALC 69 06/23/2024   Recent Labs  Lab 06/23/24 1950 06/26/24 1216  ALBUMIN   --  3.35*  AST 23  --   ALT 25  --   ALKPHOS 139*  --    TTE 06/24/24: LVEF 35%, mild LVH, mild RV, mod TR, mod PR, RVSP 39, Apical sparing LV GLS. LVIDD 4.6cm.   Prior Work-up at [facility]: RHC 06/18/2024: On milrinone  0.125 mcg/kg/min  RA = 19  RV = 49/19  PA = 51/29 (36)  PCW =  28  Fick cardiac output/index = 6.2/2.6  Thermo CO/CI 3.7/1.6  PVR = 1.1 (Fick) 1.9 (TD)  Ao sat = 95%  PA sat = 56%, 57%  PAPi = 1.1    RHC 04/23/2024: RA 16  RV 33/21  PA 36/17, mean 24  PCWP mean 19  PA 48%  AO 95%  Cardiac Output (Fick) 6.98  Cardiac Index (Fick) 2.93  PVR < 1 WU  Cardiac Output (Thermo) 4.01  Cardiac Index (Thermo) 1.68  PVR 1.24 WU   PAPi 1.2    TTE: Echo 06/20/2024  EF 30-35%, moderately reduced RV, LVIDD 5, mod LVH   Echo 04/21/2024: 1. Left ventricular ejection fraction, by estimation, is 40 to 45%. Left ventricular ejection fraction by PLAX is 43 %. The left ventricle has mildly decreased function. The left ventricle demonstrates global hypokinesis. There is severe left  ventricular hypertrophy. Left ventricular diastolic parameters are consistent with Grade III diastolic dysfunction (restrictive). Elevated left ventricular end-diastolic pressure. Avg E' 4.6 cm/s.   2. Right ventricular systolic function is  moderately reduced. The right ventricular size is mildly enlarged. Mildly increased right ventricular wall thickness. There is mildly elevated pulmonary artery systolic pressure. The estimated right ventricular  systolic pressure is 41.6 mmHg.   3. Left atrial size was mildly dilated.   4. The mitral valve is grossly normal. Trivial mitral valve regurgitation.   5. The aortic valve is tricuspid. Aortic valve regurgitation is not visualized.   6. The inferior vena cava is dilated in size with <50% respiratory variability, suggesting right atrial pressure of 15 mmHg.    CPET 04/09/2024 pVO2 7.8 (24% predicted), VE/VCO2 slope 55, RER 1.24   cMRI 9/25 IMPRESSION: 1. Mild decrease in left ventricular systolic function (LVEF =43%). Myocardial mass index of 90 g/m2, severely elevated with LGE pattern and parametric mapping consistent with cardiac amyloidosis.   2. Low normal right ventricular systolic function (RVEF =47%). Mild increase in thickness, consistent with cardiac amyloidosis. Dilation of the pulmonary artery with RV dilation by volumetric assessment.   EMBx 9/25 COMMENT:  The myocytes show cellular e hypertrophy with enlarged, hyperchromatic  and irregular nuclei.  These features are most typical of hypertrophic  or dilated cardiomyopathy.  [place] red stain is negative for amyloid.   Genetic Testing 03/24/2024 VUS in MYPN and TTN Notabely negative for mutations at GLA, LAMP2, PRKAG2, TTN  Assessment/Plan:   Keith Leblanc is a 59 y.o. male (BT O+ve, no prior sternotomy, 6'5) with PMHx of NICM (LVEF 30-35%, mod LVH, mod RVD, LVIDD 5cm), concern for HCM per EMBx, CKD stage 4, HTN, T2DM (A1c 5.5), persistent Afib on Eliquis , R MCA CVA in 10/2022 w/o residual deficits, prior alcohol use and tobacco smoking, who presented as a transfer from [facility] for heart/kidney transplant evaluation after he was admitted with decompensated heart failure and AKI on CKD requiring CRRT initiation.    We have started heart and kidney transplant evaluation. He may need MCS support, but his restrictive cardiomyopathy and small LV (LVIDD 4.6cm) limit percutaneous VAD (I.e. Impella and Protek Duo). Can consider IABP but may not be enough support. Before proceeding with MCS we want to rule out AL amyloidosis first.   His ECG has low voltage of precordial leads, echo with apical sparing, and MRI with high ECV, difficulty nulling myocardium and diffuse myocardial LGE, all concerning for amyloidosis and with his abnormal K/L ratio we are concerned about AL amyloidosis. PYP scan and EMBx at [facility] were negative for amyloid.  We appreciate the malignant heme team input. Bone marrow biopsy done 1/5 but not getting enough sample. Therefore, he underwent endomyocardial biopsy 1/6. Overall, we are trying to figure out if he has AL amyloid affecting his heart and also trying to figure out if he has a related or not related plasma cell dyscrasia or another bone marrow abnormality. We have discussed with the malignant heme team over the phone and they plan to repeat bone marrow biopsy with CT guidance. If there is AL amyloid or a plasma cell dyscrasia, there is possibility for inpatient chemotherapy to see his response and try to figure out if there is any way to transplant.   MCS: None currently.  Inotropes: dopamine Blood type: O positive Prior sternotomies None Prior CVA: R MCA in 2024, no residual deficits Diabetes: A1c 5.5 Smoking: prior. No marijuana at least since 02/2024 reported Alcohol: No drinking since 02/2024. Social support: Girlfriend.  Functional Status: Reportedly good   Recommendations: - Continue dopamine and +/- norepi - Continue CVVHD. Appreciate Nephrology team evaluation for kidney transplant - We have started heart transplant evaluation but need to await evaluation for AL amyloid as described above. We will continue to consider MCS options.  - F/up endomyocardial biopsy to evaluate  for Al amyloidosis (2+2, will request electron microscopy eval). This has been done on 1/6. We will follow-up results.   This case was discussed with the HF/Tx attending. Thank you for asking us  to participate in the care of this patient. Please page with questions.  Fontaine Lennert, MD Advanced Heart Failure and Transplant Cardiology Fellow Pager #: [phone]    ------------------------------------------------------------------------------- Attestation signed by Tobie Nanci Baptise, MD at 06/30/2024 10:09 PM  Attestation Statement:   I personally saw and evaluated the patient, and participated in the management and treatment plan as documented in the resident/fellow note.   59 y/o M (BT O, 6'5) with PMHx of NICM (LVEF 35%, mod LVH, LVIDD 5cm), concern for HCM per EMBx, CKD stage 4, persistent Afib, R MCA CVA in 10/2022 w/o residual deficits, prior alcohol use and tobacco smoking, here for heart/kidney transplant evaluation.    His CM was diagnosed in 2024 when he presented with a CVA. Had cardiac MRI at [facility] and echocardiogram with apical sparing, low voltage EKG, persistent AF raising suspicion for amyloidosis. Embx that did not reveal amyloidosis. PYP negative. K/L with lambda predominance. Per chart had genetic testing ([employer]) at [facility] and results with 2 VUS's in TTN and MYPN that do not explain the phenotype.      Etiology: concern for infiltrative CM. IVS 1.2, PWT 1.9. Repeat SFLC with lambda predominance, serum/urine IFE IP. Fabry enzyme testing (given infiltrative CM, fam Hx of CHF in his father, and renal failure, though less likely and would expect high voltage EKG on this) IP, though no variants in GLA gene.    Will continue heart/kidney txp evaluation in parallel with amyloid work up.  It seems like there is evidence of amyloid on prelim evaluation.   Suspect we will be considering biV support if he is a candidate for dual organ transplant but the confirmation of  AL amyloidosis will define our course NANCI KATHEE TOBIE, MD ------------------------------------------------------------------------------- "
